# Patient Record
Sex: Female | Born: 1960
Health system: Southern US, Community
[De-identification: ages and names within clinical notes are randomized; demographics above are authoritative.]

## PROBLEM LIST (undated history)

## (undated) DIAGNOSIS — R739 Hyperglycemia, unspecified: Secondary | ICD-10-CM

## (undated) DIAGNOSIS — K859 Acute pancreatitis without necrosis or infection, unspecified: Secondary | ICD-10-CM

## (undated) DIAGNOSIS — E119 Type 2 diabetes mellitus without complications: Secondary | ICD-10-CM

## (undated) DIAGNOSIS — K439 Ventral hernia without obstruction or gangrene: Secondary | ICD-10-CM

## (undated) DIAGNOSIS — G4730 Sleep apnea, unspecified: Secondary | ICD-10-CM

## (undated) DIAGNOSIS — M19011 Primary osteoarthritis, right shoulder: Secondary | ICD-10-CM

## (undated) DIAGNOSIS — M75101 Unspecified rotator cuff tear or rupture of right shoulder, not specified as traumatic: Secondary | ICD-10-CM

## (undated) DIAGNOSIS — T7840XA Allergy, unspecified, initial encounter: Secondary | ICD-10-CM

## (undated) DIAGNOSIS — I10 Essential (primary) hypertension: Secondary | ICD-10-CM

## (undated) DIAGNOSIS — Z9989 Dependence on other enabling machines and devices: Secondary | ICD-10-CM

## (undated) DIAGNOSIS — K219 Gastro-esophageal reflux disease without esophagitis: Secondary | ICD-10-CM

## (undated) DIAGNOSIS — K760 Fatty (change of) liver, not elsewhere classified: Secondary | ICD-10-CM

## (undated) DIAGNOSIS — E669 Obesity, unspecified: Secondary | ICD-10-CM

## (undated) DIAGNOSIS — J449 Chronic obstructive pulmonary disease, unspecified: Secondary | ICD-10-CM

## (undated) DIAGNOSIS — J45909 Unspecified asthma, uncomplicated: Secondary | ICD-10-CM

## (undated) DIAGNOSIS — D219 Benign neoplasm of connective and other soft tissue, unspecified: Secondary | ICD-10-CM

## (undated) HISTORY — DX: Allergy, unspecified, initial encounter: T78.40XA

## (undated) HISTORY — PX: ORTHOPEDIC SURGERY: SHX850

## (undated) HISTORY — DX: Type 2 diabetes mellitus without complications: E11.9

## (undated) HISTORY — DX: Dependence on other enabling machines and devices: Z99.89

## (undated) HISTORY — PX: COLONOSCOPY: SHX174

## (undated) HISTORY — DX: Hyperglycemia, unspecified: R73.9

---

## 2000-12-06 ENCOUNTER — Emergency Department (HOSPITAL_COMMUNITY): Admission: EM | Admit: 2000-12-06 | Discharge: 2000-12-06 | Payer: Self-pay | Admitting: Emergency Medicine

## 2005-07-26 ENCOUNTER — Emergency Department (HOSPITAL_COMMUNITY): Admission: EM | Admit: 2005-07-26 | Discharge: 2005-07-27 | Payer: Self-pay | Admitting: Emergency Medicine

## 2006-03-03 ENCOUNTER — Emergency Department (HOSPITAL_COMMUNITY): Admission: EM | Admit: 2006-03-03 | Discharge: 2006-03-03 | Payer: Self-pay | Admitting: Emergency Medicine

## 2006-06-09 ENCOUNTER — Emergency Department (HOSPITAL_COMMUNITY): Admission: EM | Admit: 2006-06-09 | Discharge: 2006-06-09 | Payer: Self-pay | Admitting: Emergency Medicine

## 2007-12-27 ENCOUNTER — Emergency Department (HOSPITAL_COMMUNITY): Admission: EM | Admit: 2007-12-27 | Discharge: 2007-12-27 | Payer: Self-pay | Admitting: Emergency Medicine

## 2008-08-02 ENCOUNTER — Emergency Department (HOSPITAL_COMMUNITY): Admission: EM | Admit: 2008-08-02 | Discharge: 2008-08-02 | Payer: Self-pay | Admitting: Emergency Medicine

## 2008-11-28 ENCOUNTER — Emergency Department (HOSPITAL_COMMUNITY): Admission: EM | Admit: 2008-11-28 | Discharge: 2008-11-28 | Payer: Self-pay | Admitting: Emergency Medicine

## 2008-12-01 ENCOUNTER — Emergency Department (HOSPITAL_COMMUNITY): Admission: EM | Admit: 2008-12-01 | Discharge: 2008-12-01 | Payer: Self-pay | Admitting: Emergency Medicine

## 2009-04-02 ENCOUNTER — Encounter: Payer: Self-pay | Admitting: Gynecology

## 2009-04-02 ENCOUNTER — Ambulatory Visit: Payer: Self-pay | Admitting: Gynecology

## 2009-04-02 ENCOUNTER — Other Ambulatory Visit: Admission: RE | Admit: 2009-04-02 | Discharge: 2009-04-02 | Payer: Self-pay | Admitting: Gynecology

## 2010-08-09 ENCOUNTER — Emergency Department (HOSPITAL_COMMUNITY): Admission: EM | Admit: 2010-08-09 | Discharge: 2010-08-09 | Payer: Self-pay | Admitting: Emergency Medicine

## 2010-12-06 LAB — DIFFERENTIAL
Basophils Absolute: 0 10*3/uL (ref 0.0–0.1)
Basophils Relative: 0 % (ref 0–1)
Eosinophils Absolute: 0.2 10*3/uL (ref 0.0–0.7)
Eosinophils Relative: 3 % (ref 0–5)
Lymphocytes Relative: 28 % (ref 12–46)
Lymphs Abs: 1.5 10*3/uL (ref 0.7–4.0)
Monocytes Absolute: 0.7 10*3/uL (ref 0.1–1.0)
Monocytes Relative: 13 % — ABNORMAL HIGH (ref 3–12)
Neutro Abs: 3.1 10*3/uL (ref 1.7–7.7)
Neutrophils Relative %: 56 % (ref 43–77)

## 2010-12-06 LAB — POCT I-STAT, CHEM 8
BUN: 17 mg/dL (ref 6–23)
Calcium, Ion: 1.13 mmol/L (ref 1.12–1.32)
Chloride: 106 mEq/L (ref 96–112)
Creatinine, Ser: 0.8 mg/dL (ref 0.4–1.2)
Glucose, Bld: 107 mg/dL — ABNORMAL HIGH (ref 70–99)
HCT: 40 % (ref 36.0–46.0)
Hemoglobin: 13.6 g/dL (ref 12.0–15.0)
Potassium: 3.7 mEq/L (ref 3.5–5.1)
Sodium: 141 mEq/L (ref 135–145)
TCO2: 27 mmol/L (ref 0–100)

## 2010-12-06 LAB — CBC
HCT: 38 % (ref 36.0–46.0)
Hemoglobin: 11.3 g/dL — ABNORMAL LOW (ref 12.0–15.0)
MCH: 22.2 pg — ABNORMAL LOW (ref 26.0–34.0)
MCHC: 29.7 g/dL — ABNORMAL LOW (ref 30.0–36.0)
MCV: 74.8 fL — ABNORMAL LOW (ref 78.0–100.0)
Platelets: 271 10*3/uL (ref 150–400)
RBC: 5.08 MIL/uL (ref 3.87–5.11)
RDW: 18.3 % — ABNORMAL HIGH (ref 11.5–15.5)
WBC: 5.5 10*3/uL (ref 4.0–10.5)

## 2010-12-06 LAB — POCT CARDIAC MARKERS
CKMB, poc: 1.3 ng/mL (ref 1.0–8.0)
Myoglobin, poc: 57.2 ng/mL (ref 12–200)
Troponin i, poc: <0.05 ng/mL (ref 0.00–0.09)

## 2010-12-06 LAB — D-DIMER, QUANTITATIVE: D-Dimer, Quant: 0.42 ug/mL-FEU (ref 0.00–0.48)

## 2011-01-05 LAB — URINALYSIS, ROUTINE W REFLEX MICROSCOPIC
Bilirubin Urine: NEGATIVE
Glucose, UA: NEGATIVE mg/dL
Glucose, UA: NEGATIVE mg/dL
Ketones, ur: NEGATIVE mg/dL
Ketones, ur: NEGATIVE mg/dL
Leukocytes, UA: NEGATIVE
Nitrite: NEGATIVE
Nitrite: NEGATIVE
Protein, ur: NEGATIVE mg/dL
Specific Gravity, Urine: 1.022 (ref 1.005–1.030)
Urobilinogen, UA: 0.2 mg/dL (ref 0.0–1.0)
pH: 5 (ref 5.0–8.0)
pH: 5 (ref 5.0–8.0)

## 2011-01-05 LAB — COMPREHENSIVE METABOLIC PANEL
ALT: 14 U/L (ref 0–35)
AST: 17 U/L (ref 0–37)
Albumin: 3.4 g/dL — ABNORMAL LOW (ref 3.5–5.2)
Alkaline Phosphatase: 49 U/L (ref 39–117)
Alkaline Phosphatase: 51 U/L (ref 39–117)
BUN: 10 mg/dL (ref 6–23)
BUN: 13 mg/dL (ref 6–23)
CO2: 24 mEq/L (ref 19–32)
CO2: 26 mEq/L (ref 19–32)
Calcium: 9.2 mg/dL (ref 8.4–10.5)
Calcium: 9.2 mg/dL (ref 8.4–10.5)
Chloride: 108 mEq/L (ref 96–112)
Creatinine, Ser: 0.84 mg/dL (ref 0.4–1.2)
GFR calc Af Amer: >60 mL/min (ref >60)
GFR calc non Af Amer: 58 mL/min — ABNORMAL LOW (ref >60)
GFR calc non Af Amer: >60 mL/min (ref >60)
Glucose, Bld: 105 mg/dL — ABNORMAL HIGH (ref 70–99)
Glucose, Bld: 105 mg/dL — ABNORMAL HIGH (ref 70–99)
Potassium: 4.4 mEq/L (ref 3.5–5.1)
Sodium: 141 mEq/L (ref 135–145)
Total Bilirubin: 0.4 mg/dL (ref 0.3–1.2)
Total Protein: 6.7 g/dL (ref 6.0–8.3)
Total Protein: 7.1 g/dL (ref 6.0–8.3)

## 2011-01-05 LAB — URINE MICROSCOPIC-ADD ON

## 2011-01-05 LAB — CBC
HCT: 34.5 % — ABNORMAL LOW (ref 36.0–46.0)
HCT: 34.6 % — ABNORMAL LOW (ref 36.0–46.0)
Hemoglobin: 11.3 g/dL — ABNORMAL LOW (ref 12.0–15.0)
Hemoglobin: 11.4 g/dL — ABNORMAL LOW (ref 12.0–15.0)
MCHC: 32.7 g/dL (ref 30.0–36.0)
MCHC: 32.8 g/dL (ref 30.0–36.0)
MCV: 72.5 fL — ABNORMAL LOW (ref 78.0–100.0)
Platelets: 278 10*3/uL (ref 150–400)
RBC: 4.75 MIL/uL (ref 3.87–5.11)
RDW: 18.3 % — ABNORMAL HIGH (ref 11.5–15.5)
RDW: 18.5 % — ABNORMAL HIGH (ref 11.5–15.5)
WBC: 4 10*3/uL (ref 4.0–10.5)

## 2011-01-05 LAB — DIFFERENTIAL
Band Neutrophils: 0 % (ref 0–10)
Basophils Absolute: 0 10*3/uL (ref 0.0–0.1)
Basophils Relative: 0 % (ref 0–1)
Basophils Relative: 1 % (ref 0–1)
Blasts: 0 %
Eosinophils Absolute: 0.1 10*3/uL (ref 0.0–0.7)
Eosinophils Relative: 2 % (ref 0–5)
Lymphocytes Relative: 34 % (ref 12–46)
Lymphs Abs: 1.1 10*3/uL (ref 0.7–4.0)
Lymphs Abs: 1.4 10*3/uL (ref 0.7–4.0)
Metamyelocytes Relative: 0 %
Monocytes Absolute: 0.2 10*3/uL (ref 0.1–1.0)
Monocytes Relative: 14 % — ABNORMAL HIGH (ref 3–12)
Monocytes Relative: 6 % (ref 3–12)
Myelocytes: 0 %
Neutro Abs: 2.1 10*3/uL (ref 1.7–7.7)
Neutro Abs: 2.3 10*3/uL (ref 1.7–7.7)
Neutrophils Relative %: 52 % (ref 43–77)
Neutrophils Relative %: 58 % (ref 43–77)
Promyelocytes Absolute: 0 %
nRBC: 0 /100 WBC

## 2011-01-05 LAB — POCT PREGNANCY, URINE: Preg Test, Ur: NEGATIVE

## 2011-01-05 LAB — LIPASE, BLOOD
Lipase: 38 U/L (ref 11–59)
Lipase: 44 U/L (ref 11–59)

## 2011-01-05 LAB — URINE CULTURE

## 2011-01-13 ENCOUNTER — Emergency Department (HOSPITAL_COMMUNITY)
Admission: EM | Admit: 2011-01-13 | Discharge: 2011-01-14 | Disposition: A | Discharge status: Home / Self Care | Payer: BC Managed Care – PPO | Attending: Emergency Medicine | Admitting: Emergency Medicine

## 2011-01-13 ENCOUNTER — Emergency Department (HOSPITAL_COMMUNITY): Payer: BC Managed Care – PPO

## 2011-01-13 DIAGNOSIS — F17200 Nicotine dependence, unspecified, uncomplicated: Secondary | ICD-10-CM | POA: Insufficient documentation

## 2011-01-13 DIAGNOSIS — R109 Unspecified abdominal pain: Secondary | ICD-10-CM | POA: Insufficient documentation

## 2011-01-13 DIAGNOSIS — R10819 Abdominal tenderness, unspecified site: Secondary | ICD-10-CM | POA: Insufficient documentation

## 2011-01-13 DIAGNOSIS — R630 Anorexia: Secondary | ICD-10-CM | POA: Insufficient documentation

## 2011-01-13 DIAGNOSIS — R079 Chest pain, unspecified: Secondary | ICD-10-CM | POA: Insufficient documentation

## 2011-01-13 LAB — COMPREHENSIVE METABOLIC PANEL
ALT: 26 U/L (ref 0–35)
AST: 17 U/L (ref 0–37)
Albumin: 3.5 g/dL (ref 3.5–5.2)
Alkaline Phosphatase: 65 U/L (ref 39–117)
BUN: 15 mg/dL (ref 6–23)
Chloride: 105 mEq/L (ref 96–112)
GFR calc Af Amer: >60 mL/min (ref >60)
Potassium: 4 mEq/L (ref 3.5–5.1)
Sodium: 139 mEq/L (ref 135–145)
Total Bilirubin: 0.4 mg/dL (ref 0.3–1.2)
Total Protein: 7.1 g/dL (ref 6.0–8.3)

## 2011-01-13 LAB — CBC
MCV: 72.8 fL — ABNORMAL LOW (ref 78.0–100.0)
Platelets: 245 10*3/uL (ref 150–400)
RBC: 4.97 MIL/uL (ref 3.87–5.11)
RDW: 19.8 % — ABNORMAL HIGH (ref 11.5–15.5)
WBC: 6.9 10*3/uL (ref 4.0–10.5)

## 2011-01-13 LAB — URINALYSIS, ROUTINE W REFLEX MICROSCOPIC
Bilirubin Urine: NEGATIVE
Glucose, UA: NEGATIVE mg/dL
Hgb urine dipstick: NEGATIVE
Ketones, ur: NEGATIVE mg/dL
Protein, ur: NEGATIVE mg/dL
pH: 5.5 (ref 5.0–8.0)

## 2011-01-13 LAB — DIFFERENTIAL
Basophils Relative: 0 % (ref 0–1)
Eosinophils Absolute: 0.3 10*3/uL (ref 0.0–0.7)
Lymphocytes Relative: 32 % (ref 12–46)
Monocytes Absolute: 0.8 10*3/uL (ref 0.1–1.0)
Neutrophils Relative %: 53 % (ref 43–77)

## 2011-10-21 ENCOUNTER — Encounter (HOSPITAL_COMMUNITY): Payer: Self-pay | Admitting: *Deleted

## 2011-10-21 ENCOUNTER — Other Ambulatory Visit: Payer: Self-pay

## 2011-10-21 ENCOUNTER — Emergency Department (HOSPITAL_COMMUNITY): Payer: BC Managed Care – PPO

## 2011-10-21 ENCOUNTER — Emergency Department (HOSPITAL_COMMUNITY)
Admission: EM | Admit: 2011-10-21 | Discharge: 2011-10-22 | Disposition: A | Discharge status: Home / Self Care | Payer: BC Managed Care – PPO | Attending: Emergency Medicine | Admitting: Emergency Medicine

## 2011-10-21 DIAGNOSIS — K859 Acute pancreatitis without necrosis or infection, unspecified: Secondary | ICD-10-CM | POA: Insufficient documentation

## 2011-10-21 DIAGNOSIS — R10819 Abdominal tenderness, unspecified site: Secondary | ICD-10-CM | POA: Insufficient documentation

## 2011-10-21 DIAGNOSIS — R42 Dizziness and giddiness: Secondary | ICD-10-CM | POA: Insufficient documentation

## 2011-10-21 DIAGNOSIS — R109 Unspecified abdominal pain: Secondary | ICD-10-CM | POA: Insufficient documentation

## 2011-10-21 DIAGNOSIS — F17200 Nicotine dependence, unspecified, uncomplicated: Secondary | ICD-10-CM | POA: Insufficient documentation

## 2011-10-21 DIAGNOSIS — R079 Chest pain, unspecified: Secondary | ICD-10-CM | POA: Insufficient documentation

## 2011-10-21 HISTORY — DX: Gastro-esophageal reflux disease without esophagitis: K21.9

## 2011-10-21 LAB — CBC
HCT: 41.5 % (ref 36.0–46.0)
Hemoglobin: 12.9 g/dL (ref 12.0–15.0)
MCHC: 31.1 g/dL (ref 30.0–36.0)
WBC: 5.7 10*3/uL (ref 4.0–10.5)

## 2011-10-21 LAB — DIFFERENTIAL
Basophils Absolute: 0 10*3/uL (ref 0.0–0.1)
Basophils Relative: 1 % (ref 0–1)
Lymphocytes Relative: 31 % (ref 12–46)
Monocytes Relative: 11 % (ref 3–12)
Neutro Abs: 3.1 10*3/uL (ref 1.7–7.7)
Neutrophils Relative %: 54 % (ref 43–77)

## 2011-10-21 LAB — COMPREHENSIVE METABOLIC PANEL
AST: 16 U/L (ref 0–37)
Albumin: 3.9 g/dL (ref 3.5–5.2)
Alkaline Phosphatase: 66 U/L (ref 39–117)
BUN: 17 mg/dL (ref 6–23)
CO2: 27 mEq/L (ref 19–32)
Chloride: 101 mEq/L (ref 96–112)
GFR calc non Af Amer: >90 mL/min (ref >90)
Potassium: 4.1 mEq/L (ref 3.5–5.1)
Total Bilirubin: 0.2 mg/dL — ABNORMAL LOW (ref 0.3–1.2)

## 2011-10-21 MED ORDER — GI COCKTAIL ~~LOC~~
30.0000 mL | Freq: Once | ORAL | Status: AC
  Administered 2011-10-21: 30 mL via ORAL
  Filled 2011-10-21: qty 30

## 2011-10-21 MED ORDER — ONDANSETRON HCL 4 MG/2ML IJ SOLN
4.0000 mg | Freq: Once | INTRAMUSCULAR | Status: AC
  Administered 2011-10-21: 4 mg via INTRAVENOUS
  Filled 2011-10-21: qty 2

## 2011-10-21 MED ORDER — HYDROMORPHONE HCL PF 1 MG/ML IJ SOLN
1.0000 mg | Freq: Once | INTRAMUSCULAR | Status: AC
  Administered 2011-10-21: 1 mg via INTRAVENOUS
  Filled 2011-10-21: qty 1

## 2011-10-21 MED ORDER — PANTOPRAZOLE SODIUM 40 MG IV SOLR
40.0000 mg | Freq: Once | INTRAVENOUS | Status: AC
  Administered 2011-10-21: 40 mg via INTRAVENOUS
  Filled 2011-10-21: qty 40

## 2011-10-21 MED ORDER — SODIUM CHLORIDE 0.9 % IV SOLN
Freq: Once | INTRAVENOUS | Status: AC
  Administered 2011-10-21: 23:00:00 via INTRAVENOUS

## 2011-10-21 MED ORDER — IOHEXOL 300 MG/ML  SOLN
100.0000 mL | Freq: Once | INTRAMUSCULAR | Status: AC | PRN
  Administered 2011-10-21: 100 mL via INTRAVENOUS

## 2011-10-21 MED ORDER — MORPHINE SULFATE 4 MG/ML IJ SOLN
4.0000 mg | Freq: Once | INTRAMUSCULAR | Status: AC
  Administered 2011-10-21: 4 mg via INTRAVENOUS
  Filled 2011-10-21: qty 1

## 2011-10-21 MED ORDER — HYDROMORPHONE HCL PF 1 MG/ML IJ SOLN
1.0000 mg | INTRAMUSCULAR | Status: DC | PRN
  Administered 2011-10-21: 1 mg via INTRAVENOUS
  Filled 2011-10-21 (×2): qty 1

## 2011-10-21 NOTE — ED Provider Notes (Signed)
History     CSN: [id number]  Arrival date & time 10/21/11  4098   First MD Initiated Contact with Patient 10/21/11 2006      Chief Complaint  Patient presents with  . Abdominal Pain    epigastric, radiating to chest and umbilicus.  . Chest Pain  . Nausea  . Emesis     HPI  History provided by the patient. Patient is a 51 year old African American female with past history of acid reflux who presents with complaints of 3 days of epigastric and chest pains. Patient reports pain has gradually increased. Pain was waxing and waning over this time. Pain is described as sharp and burning. Pain is made worse by ED. Patient states pain increases significantly 5 minutes after trying to eat any foods. She has had decreased appetite. Patient reports keeping down fluids. She does report associated nausea and one episode of vomiting this morning. Patient denies having any diarrhea or constipation. She denies any blood or melena in stools. Patient denies any fever, chills, sweats. She denies any shortness of breath or heart palpitations. She has no other significant past medical history.    Past Medical History  Diagnosis Date  . Acid reflux     Past Surgical History  Procedure Date  . Orthopedic surgery     R ankle, tendonitis    History reviewed. No pertinent family history.  History  Substance Use Topics  . Smoking status: Current Everyday Smoker -- 0.5 packs/day    Types: Cigarettes  . Smokeless tobacco: Not on file  . Alcohol Use: No    OB History    Grav Para Term Preterm Abortions TAB SAB Ect Mult Living                  Review of Systems  Constitutional: Positive for appetite change. Negative for fever, chills and diaphoresis.  Respiratory: Negative for shortness of breath.   Cardiovascular: Positive for chest pain. Negative for palpitations.  Gastrointestinal: Positive for nausea, vomiting and abdominal pain. Negative for diarrhea and constipation.  Genitourinary:  Negative for dysuria, frequency, hematuria, flank pain, vaginal bleeding and vaginal discharge.  All other systems reviewed and are negative.    Allergies  Aspirin  Home Medications   Current Outpatient Rx  Name Route Sig Dispense Refill  . BISMUTH SUBSALICYLATE 262 MG/15ML PO SUSP Oral Take 15 mLs by mouth every 6 (six) hours as needed.      BP 153/102  Pulse 86  Temp(Src) 99.1 F (37.3 C) (Oral)  Resp 20  Ht 5\' 9"  (1.753 m)  Wt 281 lb 12 oz (127.8 kg)  BMI 41.61 kg/m2  SpO2 99%  Physical Exam  Nursing note and vitals reviewed. Constitutional: She is oriented to person, place, and time. She appears well-developed and well-nourished. No distress.  HENT:  Head: Normocephalic and atraumatic.  Mouth/Throat: Oropharynx is clear and moist.  Cardiovascular: Normal rate and regular rhythm.   Pulmonary/Chest: Effort normal and breath sounds normal. No respiratory distress. She has no wheezes. She has no rales. She exhibits tenderness.       Reproducible chest pain and pressure under left breast and along left sternal edge. No deformity or crepitus. Normal movements  Abdominal: Soft. There is tenderness in the right upper quadrant, epigastric area and left upper quadrant. There is rebound and positive Murphy's sign. There is no guarding, no CVA tenderness and no tenderness at McBurney's point.       Morbidly obese  Genitourinary: Rectum normal.  Chaperone was present  Neurological: She is alert and oriented to person, place, and time.  Skin: Skin is warm and dry. No rash noted.  Psychiatric: She has a normal mood and affect. Her behavior is normal.    ED Course  Procedures    Labs Reviewed  CBC  DIFFERENTIAL  I-STAT TROPONIN I  COMPREHENSIVE METABOLIC PANEL  LIPASE, BLOOD  POCT OCCULT BLOOD STOOL, DEVICE   Results for orders placed during the hospital encounter of 10/21/11  CBC      Component Value Range   WBC 5.7  4.0 - 10.5 (K/uL)   RBC 5.29 (*) 3.87 - 5.11  (MIL/uL)   Hemoglobin 12.9  12.0 - 15.0 (g/dL)   HCT 32.9  51.8 - 84.1 (%)   MCV 78.4  78.0 - 100.0 (fL)   MCH 24.4 (*) 26.0 - 34.0 (pg)   MCHC 31.1  30.0 - 36.0 (g/dL)   RDW 66.0 (*) 63.0 - 15.5 (%)   Platelets 262  150 - 400 (K/uL)  DIFFERENTIAL      Component Value Range   Neutrophils Relative 54  43 - 77 (%)   Neutro Abs 3.1  1.7 - 7.7 (K/uL)   Lymphocytes Relative 31  12 - 46 (%)   Lymphs Abs 1.8  0.7 - 4.0 (K/uL)   Monocytes Relative 11  3 - 12 (%)   Monocytes Absolute 0.6  0.1 - 1.0 (K/uL)   Eosinophils Relative 4  0 - 5 (%)   Eosinophils Absolute 0.2  0.0 - 0.7 (K/uL)   Basophils Relative 1  0 - 1 (%)   Basophils Absolute 0.0  0.0 - 0.1 (K/uL)  COMPREHENSIVE METABOLIC PANEL      Component Value Range   Sodium 137  135 - 145 (mEq/L)   Potassium 4.1  3.5 - 5.1 (mEq/L)   Chloride 101  96 - 112 (mEq/L)   CO2 27  19 - 32 (mEq/L)   Glucose, Bld 110 (*) 70 - 99 (mg/dL)   BUN 17  6 - 23 (mg/dL)   Creatinine, Ser 1.60  0.50 - 1.10 (mg/dL)   Calcium 9.6  8.4 - 10.9 (mg/dL)   Total Protein 8.3  6.0 - 8.3 (g/dL)   Albumin 3.9  3.5 - 5.2 (g/dL)   AST 16  0 - 37 (U/L)   ALT 22  0 - 35 (U/L)   Alkaline Phosphatase 66  39 - 117 (U/L)   Total Bilirubin 0.2 (*) 0.3 - 1.2 (mg/dL)   GFR calc non Af Amer >90  >90 (mL/min)   GFR calc Af Amer >90  >90 (mL/min)  LIPASE, BLOOD      Component Value Range   Lipase 61 (*) 11 - 59 (U/L)  OCCULT BLOOD, POC DEVICE      Component Value Range   Fecal Occult Bld NEGATIVE    POCT I-STAT TROPONIN I      Component Value Range   Troponin i, poc 0.00  0.00 - 0.08 (ng/mL)   Comment 3              Ct Angio Chest W/cm &/or Wo Cm  10/21/2011  *RADIOLOGY REPORT*  Clinical Data: Chest pain.  Dizziness.  Weakness.  Clinical suspicion for pulmonary embolism.  CT ANGIOGRAPHY CHEST  Technique:  Multidetector CT imaging of the chest using the standard protocol during bolus administration of intravenous contrast. Multiplanar reconstructed images including  MIPs were obtained and reviewed to evaluate the vascular anatomy.  Contrast: OMNIPAQUE  IOHEXOL 300 MG/ML IV SOLN  Comparison: None.  Findings: Satisfactory opacification of the pulmonary arteries noted, and there is no evidence of pulmonary emboli.  No evidence of thoracic aortic aneurysm or dissection.  No evidence of mediastinal hematoma or mass.  No lymphadenopathy seen elsewhere within the thorax.  There is no evidence of pleural or pericardial effusion.  No evidence of pulmonary infiltrate or mass.  No central endobronchial lesion identified.  IMPRESSION: Negative.  No evidence of pulmonary embolism or other active disease.  Original Report Authenticated By: Danae Orleans, M.D.   US Abdomen Complete  10/21/2011  *RADIOLOGY REPORT*  Clinical Data:  Epigastric pain radiating into the chest and umbilical areas for 3 days.  History of splenic hemangioma.  COMPLETE ABDOMINAL ULTRASOUND  Comparison:  Ultrasound 01/13/2011.  CT 12/01/2008.  Findings:  Gallbladder:  No gallstones, gallbladder wall thickening, or pericholecystic fluid.  Common bile duct:  Normal caliber extrahepatic bile ducts with normal diameter measured at about 6 mm.  Liver:  Diffusely increased echotexture throughout the liver consistent with infiltrative process such as fatty infiltration. No focal lesions demonstrated.  IVC:  Appears normal.  Pancreas:  Pancreas is not well visualized due to bowel gas and body habitus.  Spleen:  The spleen length measures 9.4 cm.  There is a poorly defined hyperechoic lesion in the superior aspect of the spleen measuring 3.3 x 5.2 x 4.6 cm.  No flow is demonstrated on color flow Doppler images.  The lesion appears stable since the previous ultrasound and CT studies and likely represents hemangioma.  Right Kidney:  Right kidney measures 12.3 cm length.  No hydronephrosis.  Left Kidney:  Left kidney measures 12.3 cm length.  No hydronephrosis.  Abdominal aorta:  Segmental visualization of the abdominal  aorta due to bowel gas.  Visualized portions demonstrate normal caliber.  IMPRESSION: Stable appearance of hypoechoic mass in the spleen suggesting hemangioma.  Diffuse coarsening of liver echotexture suggesting infiltrative process such as fatty infiltration.  No evidence of cholelithiasis or cholecystitis.  Original Report Authenticated By: Marlon Pel, M.D.     1. Pancreatitis       MDM  8:00 PM patient seen and evaluated. Patient in no acute distress. Patient signs of discomfort. Patient with epigastric and right upper quadrant pain with positive rebound sign. Symptoms concerning for biliary colic. Will begin initial workup with labs and order ultrasound.  10:30 PM patient feeling much better after pain medications. Patient still with some upper abdominal discomfort on exam. CT and ultrasound have not shown any concerning or emergent cause of symptoms. Lipase is elevated today. Pancreas was not visualized well on ultrasound. EKG and troponin normal. Patient with significant tenderness and pains in the abdomen with elevated lipase. ACS is thought less likely. Patient has no history of hypertension, hypercholesterolemia, diabetes, or CAD.  12:30 AM patient has been tolerating by mouth fluids with cup of ginger ale. She has no symptoms of nausea vomiting while here. Pain has improved. At this time plan to send patient home with diagnosis of mild pancreatitis. Patient has been given strict return precautions.     Date: 10/21/2011  Rate: 84  Rhythm: normal sinus rhythm  QRS Axis: normal  Intervals: normal  ST/T Wave abnormalities: normal  Conduction Disutrbances:none  Narrative Interpretation: Possible left atrial enlargement  Old EKG Reviewed: unchanged from 01/13/2012        Angus Seller, PA 10/22/11 0422

## 2011-10-21 NOTE — ED Notes (Signed)
[place], MD at bedside.  

## 2011-10-21 NOTE — ED Notes (Signed)
Dammen, PA at bedside.  

## 2011-10-21 NOTE — ED Notes (Signed)
Patient states "the pain has come back to where it was before." Rates pain 9 on 0-10 scale. Dammen, PA notified.

## 2011-10-21 NOTE — ED Notes (Signed)
Pt c/o epigastric pain radiating to sub-sternal chest and umbilicus. Pt states pain started x 3 days ago, characterized as sharp, shooting, stabbing. Pt states history of acid reflux, denies cardiac and pulmonary hx. Pt c/o n/v x 1 episode this morning.

## 2011-10-21 NOTE — ED Notes (Signed)
Patient transported to CT 

## 2011-10-21 NOTE — ED Notes (Signed)
Ultrasound tech at bedside to perform [place] of abdomen.

## 2011-10-21 NOTE — ED Notes (Signed)
Returned from CT.

## 2011-10-22 MED ORDER — PROMETHAZINE HCL 25 MG PO TABS: 25.0000 mg | ORAL_TABLET | Freq: Four times a day (QID) | ORAL | Status: AC | PRN

## 2011-10-22 MED ORDER — HYDROMORPHONE HCL PF 1 MG/ML IJ SOLN
1.0000 mg | Freq: Once | INTRAMUSCULAR | Status: AC
  Administered 2011-10-22: 1 mg via INTRAVENOUS

## 2011-10-22 MED ORDER — OXYCODONE-ACETAMINOPHEN 5-325 MG PO TABS: 1.0000 | ORAL_TABLET | Freq: Four times a day (QID) | ORAL | Status: AC | PRN

## 2011-10-22 NOTE — ED Provider Notes (Signed)
BP 133/92  Pulse 94  Temp(Src) 99.1 F (37.3 C) (Oral)  Resp 16  Ht 5\' 9"  (1.753 m)  Wt 281 lb 12 oz (127.8 kg)  BMI 41.61 kg/m2  SpO2 93% Pt well appearing, abd soft but reports continued pain in epigastric region Suspect mild pancreatitis Otherwise stable at this time  Joya Gaskins, MD 10/22/11 [phone]

## 2011-10-22 NOTE — ED Notes (Signed)
Dammen, PA at bedside.  

## 2011-10-22 NOTE — ED Notes (Signed)
Patient drank a small amount ginger ale. States no n/v but that abdominal pain came back after drinking ginger ale.

## 2011-10-23 NOTE — ED Provider Notes (Signed)
Medical screening examination/treatment/procedure(s) were conducted as a shared visit with non-physician practitioner(s) and myself.  I personally evaluated the patient during the encounter  Loren Racer, MD 10/23/11 [phone]

## 2011-11-04 ENCOUNTER — Emergency Department (HOSPITAL_COMMUNITY): Payer: BC Managed Care – PPO

## 2011-11-04 ENCOUNTER — Inpatient Hospital Stay (HOSPITAL_COMMUNITY)
Admission: EM | Admit: 2011-11-04 | Discharge: 2011-11-09 | DRG: 204 | Disposition: A | Discharge status: Home / Self Care | Payer: BC Managed Care – PPO | Attending: Internal Medicine | Admitting: Internal Medicine

## 2011-11-04 ENCOUNTER — Encounter (HOSPITAL_COMMUNITY): Payer: Self-pay | Admitting: *Deleted

## 2011-11-04 ENCOUNTER — Other Ambulatory Visit: Payer: Self-pay

## 2011-11-04 DIAGNOSIS — F17200 Nicotine dependence, unspecified, uncomplicated: Secondary | ICD-10-CM | POA: Diagnosis present

## 2011-11-04 DIAGNOSIS — K7689 Other specified diseases of liver: Secondary | ICD-10-CM | POA: Diagnosis present

## 2011-11-04 DIAGNOSIS — Z6830 Body mass index (BMI) 30.0-30.9, adult: Secondary | ICD-10-CM

## 2011-11-04 DIAGNOSIS — K760 Fatty (change of) liver, not elsewhere classified: Secondary | ICD-10-CM | POA: Diagnosis present

## 2011-11-04 DIAGNOSIS — K219 Gastro-esophageal reflux disease without esophagitis: Secondary | ICD-10-CM | POA: Diagnosis present

## 2011-11-04 DIAGNOSIS — E669 Obesity, unspecified: Secondary | ICD-10-CM | POA: Diagnosis present

## 2011-11-04 DIAGNOSIS — K859 Acute pancreatitis without necrosis or infection, unspecified: Principal | ICD-10-CM | POA: Diagnosis present

## 2011-11-04 DIAGNOSIS — Z6841 Body Mass Index (BMI) 40.0 and over, adult: Secondary | ICD-10-CM | POA: Diagnosis present

## 2011-11-04 DIAGNOSIS — Z6839 Body mass index (BMI) 39.0-39.9, adult: Secondary | ICD-10-CM

## 2011-11-04 DIAGNOSIS — Z720 Tobacco use: Secondary | ICD-10-CM | POA: Diagnosis present

## 2011-11-04 DIAGNOSIS — R1013 Epigastric pain: Secondary | ICD-10-CM | POA: Diagnosis present

## 2011-11-04 DIAGNOSIS — D219 Benign neoplasm of connective and other soft tissue, unspecified: Secondary | ICD-10-CM | POA: Diagnosis present

## 2011-11-04 HISTORY — DX: Ventral hernia without obstruction or gangrene: K43.9

## 2011-11-04 HISTORY — DX: Benign neoplasm of connective and other soft tissue, unspecified: D21.9

## 2011-11-04 HISTORY — DX: Essential (primary) hypertension: I10

## 2011-11-04 HISTORY — DX: Acute pancreatitis without necrosis or infection, unspecified: K85.90

## 2011-11-04 HISTORY — DX: Obesity, unspecified: E66.9

## 2011-11-04 HISTORY — DX: Fatty (change of) liver, not elsewhere classified: K76.0

## 2011-11-04 LAB — DIFFERENTIAL
Eosinophils Relative: 4 % (ref 0–5)
Lymphocytes Relative: 28 % (ref 12–46)
Lymphs Abs: 1.7 10*3/uL (ref 0.7–4.0)
Monocytes Absolute: 0.8 10*3/uL (ref 0.1–1.0)
Monocytes Relative: 14 % — ABNORMAL HIGH (ref 3–12)

## 2011-11-04 LAB — URINALYSIS, ROUTINE W REFLEX MICROSCOPIC
Hgb urine dipstick: NEGATIVE
Protein, ur: NEGATIVE mg/dL
Urobilinogen, UA: 0.2 mg/dL (ref 0.0–1.0)

## 2011-11-04 LAB — COMPREHENSIVE METABOLIC PANEL
ALT: 20 U/L (ref 0–35)
BUN: 13 mg/dL (ref 6–23)
CO2: 25 mEq/L (ref 19–32)
Calcium: 9.4 mg/dL (ref 8.4–10.5)
Creatinine, Ser: 0.58 mg/dL (ref 0.50–1.10)
GFR calc Af Amer: >90 mL/min (ref >90)
GFR calc non Af Amer: >90 mL/min (ref >90)
Glucose, Bld: 106 mg/dL — ABNORMAL HIGH (ref 70–99)
Sodium: 139 mEq/L (ref 135–145)

## 2011-11-04 LAB — CBC
HCT: 40.4 % (ref 36.0–46.0)
Hemoglobin: 12.4 g/dL (ref 12.0–15.0)
MCV: 78.4 fL (ref 78.0–100.0)
RBC: 5.15 MIL/uL — ABNORMAL HIGH (ref 3.87–5.11)
WBC: 6 10*3/uL (ref 4.0–10.5)

## 2011-11-04 LAB — LIPASE, BLOOD: Lipase: 62 U/L — ABNORMAL HIGH (ref 11–59)

## 2011-11-04 MED ORDER — SODIUM CHLORIDE 0.9 % IV SOLN
INTRAVENOUS | Status: AC
  Administered 2011-11-04: 14:00:00 via INTRAVENOUS

## 2011-11-04 MED ORDER — ONDANSETRON HCL 4 MG/2ML IJ SOLN: 4.0000 mg | Freq: Three times a day (TID) | INTRAMUSCULAR | Status: AC | PRN

## 2011-11-04 MED ORDER — NICOTINE 7 MG/24HR TD PT24
7.0000 mg | MEDICATED_PATCH | Freq: Every day | TRANSDERMAL | Status: DC
  Administered 2011-11-04 – 2011-11-09 (×6): 7 mg via TRANSDERMAL
  Filled 2011-11-04 (×7): qty 1

## 2011-11-04 MED ORDER — HYDROMORPHONE HCL PF 1 MG/ML IJ SOLN
1.0000 mg | Freq: Once | INTRAMUSCULAR | Status: AC
  Administered 2011-11-04: 1 mg via INTRAVENOUS
  Filled 2011-11-04: qty 1

## 2011-11-04 MED ORDER — POLYETHYLENE GLYCOL 3350 17 G PO PACK
17.0000 g | PACK | Freq: Every day | ORAL | Status: DC | PRN
  Filled 2011-11-04: qty 1

## 2011-11-04 MED ORDER — ZOLPIDEM TARTRATE 5 MG PO TABS
5.0000 mg | ORAL_TABLET | Freq: Every evening | ORAL | Status: DC | PRN
  Administered 2011-11-07 – 2011-11-08 (×2): 5 mg via ORAL
  Filled 2011-11-04 (×2): qty 1

## 2011-11-04 MED ORDER — PANTOPRAZOLE SODIUM 40 MG PO TBEC
40.0000 mg | DELAYED_RELEASE_TABLET | Freq: Every day | ORAL | Status: DC
  Administered 2011-11-04 – 2011-11-09 (×6): 40 mg via ORAL
  Filled 2011-11-04 (×8): qty 1

## 2011-11-04 MED ORDER — HYDROMORPHONE HCL PF 1 MG/ML IJ SOLN
1.0000 mg | INTRAMUSCULAR | Status: AC | PRN
  Administered 2011-11-04 (×3): 1 mg via INTRAVENOUS
  Filled 2011-11-04 (×3): qty 1

## 2011-11-04 MED ORDER — PANTOPRAZOLE SODIUM 40 MG IV SOLR
40.0000 mg | Freq: Once | INTRAVENOUS | Status: AC
  Administered 2011-11-04: 40 mg via INTRAVENOUS
  Filled 2011-11-04: qty 40

## 2011-11-04 MED ORDER — ONDANSETRON HCL 4 MG/2ML IJ SOLN
4.0000 mg | Freq: Once | INTRAMUSCULAR | Status: AC
  Administered 2011-11-04: 4 mg via INTRAVENOUS
  Filled 2011-11-04: qty 2

## 2011-11-04 MED ORDER — HYDROCODONE-ACETAMINOPHEN 5-325 MG PO TABS
1.0000 | ORAL_TABLET | ORAL | Status: DC | PRN
  Administered 2011-11-04 – 2011-11-06 (×6): 2 via ORAL
  Administered 2011-11-06: 1 via ORAL
  Administered 2011-11-06 – 2011-11-07 (×6): 2 via ORAL
  Administered 2011-11-08: 1 via ORAL
  Administered 2011-11-08: 2 via ORAL
  Filled 2011-11-04 (×8): qty 2
  Filled 2011-11-04: qty 1
  Filled 2011-11-04 (×7): qty 2

## 2011-11-04 MED ORDER — ONDANSETRON HCL 4 MG/2ML IJ SOLN
4.0000 mg | Freq: Four times a day (QID) | INTRAMUSCULAR | Status: DC | PRN
  Administered 2011-11-04 – 2011-11-09 (×6): 4 mg via INTRAVENOUS
  Filled 2011-11-04 (×9): qty 2

## 2011-11-04 MED ORDER — BISACODYL 5 MG PO TBEC: 5.0000 mg | DELAYED_RELEASE_TABLET | Freq: Every day | ORAL | Status: DC | PRN

## 2011-11-04 MED ORDER — HYDRALAZINE HCL 20 MG/ML IJ SOLN
10.0000 mg | Freq: Four times a day (QID) | INTRAMUSCULAR | Status: DC | PRN
  Administered 2011-11-04 – 2011-11-08 (×2): 10 mg via INTRAVENOUS
  Filled 2011-11-04 (×2): qty 1

## 2011-11-04 MED ORDER — ONDANSETRON HCL 4 MG PO TABS
4.0000 mg | ORAL_TABLET | Freq: Four times a day (QID) | ORAL | Status: DC | PRN
  Administered 2011-11-07 – 2011-11-08 (×2): 4 mg via ORAL
  Filled 2011-11-04: qty 1

## 2011-11-04 MED ORDER — IOHEXOL 300 MG/ML  SOLN
100.0000 mL | Freq: Once | INTRAMUSCULAR | Status: AC | PRN
  Administered 2011-11-04: 100 mL via INTRAVENOUS

## 2011-11-04 MED ORDER — ACETAMINOPHEN 325 MG PO TABS
650.0000 mg | ORAL_TABLET | Freq: Four times a day (QID) | ORAL | Status: DC | PRN
  Administered 2011-11-04: 650 mg via ORAL
  Filled 2011-11-04 (×2): qty 2

## 2011-11-04 MED ORDER — ALUM & MAG HYDROXIDE-SIMETH 200-200-20 MG/5ML PO SUSP: 30.0000 mL | Freq: Four times a day (QID) | ORAL | Status: DC | PRN

## 2011-11-04 MED ORDER — MORPHINE SULFATE 2 MG/ML IJ SOLN
2.0000 mg | INTRAMUSCULAR | Status: DC | PRN
  Administered 2011-11-04 – 2011-11-07 (×7): 2 mg via INTRAVENOUS
  Filled 2011-11-04 (×7): qty 1

## 2011-11-04 MED ORDER — SODIUM CHLORIDE 0.9 % IV SOLN
INTRAVENOUS | Status: DC
  Administered 2011-11-04 – 2011-11-07 (×6): via INTRAVENOUS

## 2011-11-04 MED ORDER — ACETAMINOPHEN 650 MG RE SUPP: 650.0000 mg | Freq: Four times a day (QID) | RECTAL | Status: DC | PRN

## 2011-11-04 MED ORDER — FAMOTIDINE 20 MG PO TABS
20.0000 mg | ORAL_TABLET | Freq: Two times a day (BID) | ORAL | Status: DC
  Administered 2011-11-04 – 2011-11-09 (×11): 20 mg via ORAL
  Filled 2011-11-04 (×14): qty 1

## 2011-11-04 NOTE — ED Provider Notes (Deleted)
History     CSN: [id number]  Arrival date & time 11/04/11  4098   First MD Initiated Contact with Patient 11/04/11 [phone]      Chief Complaint  Patient presents with  . Shortness of Breath  . Abdominal Pain    epigastric, rib arch    (Consider location/radiation/quality/duration/timing/severity/associated sxs/prior treatment) HPI Patient left AMA before I could talk to her Past Medical History  Diagnosis Date  . Acid reflux   . Pancreatitis     Past Surgical History  Procedure Date  . Orthopedic surgery     R ankle, tendonitis    History reviewed. No pertinent family history.  History  Substance Use Topics  . Smoking status: Current Everyday Smoker -- 0.5 packs/day    Types: Cigarettes  . Smokeless tobacco: Not on file  . Alcohol Use: No    OB History    Grav Para Term Preterm Abortions TAB SAB Ect Mult Living                  Review of Systems  Allergies  Aspirin  Home Medications   Current Outpatient Rx  Name Route Sig Dispense Refill  . BISMUTH SUBSALICYLATE 262 MG/15ML PO SUSP Oral Take 15 mLs by mouth every 6 (six) hours as needed. Upset stomach      BP 179/99  Pulse 82  Temp 98.1 F (36.7 C)  Resp 20  SpO2 100%  Physical Exam  ED Course  Procedures (including critical care time)  Labs Reviewed - No data to display No results found.   1. Abdominal pain       MDM  Left AMA        Dione Booze, MD 11/04/11 [phone]

## 2011-11-04 NOTE — ED Notes (Signed)
Gave report to [place] up on 5W

## 2011-11-04 NOTE — Progress Notes (Signed)
Tom Callahan,P.A. Aware via phone of recent elevated b/p's. Pt asymptomatic. Pain med recently given. See new orders entered by Tom into EP[facility]. Next shift RN, [employer], made aware.

## 2011-11-04 NOTE — ED Notes (Signed)
Pt reports she was seen two weeks ago for pancreatitis and sent home with pain management and told to drink clear liquids and crackers. Pt reports she has used up all her pain medication and pain never fully went away. Pt reports starting to eat a regular diet last Saturday and has had increasing central upper abdominal pain since that comes and goes and nausea without vomiting. Pt denies using alcohol

## 2011-11-04 NOTE — ED Notes (Signed)
Pt c/o epigastric and rib arch pain that increases w/ inspiration and PO fluids and food. Pt recently diagnosed w/ pancreatitis, denies ETOH.

## 2011-11-04 NOTE — H&P (Signed)
History and Physical  Sharon Swanson NWG:[id number] DOB: 01-Mar-1961 DOA: 11/04/2011  Referring physician: Dione Booze, MD PCP: None  Chief Complaint: Abdominal pain  HPI:  51 year old woman presented to the emergency department with epigastric pain. She was in the emergency department January 26 and diagnosed with mild pancreatitis. She was discharged home on pain medication.  She reports continued and constant pain over the last 4 weeks. Much worse with eating. Epigastric in location and severe in nature. She has had no nausea or vomiting with this. No diarrhea. No constipation. She has no previous history of GI illness. No history of pancreatitis. Denies alcohol intake.  She was diagnosed presumptively with pancreatitis and referred for admission.  Review of Systems:  Negative for fever, changes to her vision, sore throat, rash, muscle aches, chest pain, dysuria, bleeding. Mildly short of breath.  Past Medical History  Diagnosis Date  . Acid reflux    Past Surgical History  Procedure Date  . Orthopedic surgery     R ankle, tendonitis   Social History:  reports that she has been smoking Cigarettes.  She has been smoking about .3 packs per day. She does not have any smokeless tobacco history on file. She reports that she does not drink alcohol or use illicit drugs.  Allergies  Allergen Reactions  . Aspirin     Abdominal pain    Family History  Problem Relation Age of Onset  . Hypertension Mother     Prior to Admission medications   Medication Sig Start Date End Date Taking? Authorizing Provider  bismuth subsalicylate (PEPTO BISMOL) 262 MG/15ML suspension Take 15 mLs by mouth every 6 (six) hours as needed. Upset stomach    Historical Provider, MD   Physical Exam: Filed Vitals:   11/04/11 0655  BP: 179/99  Pulse: 82  Temp: 98.1 F (36.7 C)  Resp: 20  SpO2: 100%     General:  Appears calm. Appears to be uncomfortable. Nontoxic.  Eyes: Pupils equal, round, reactive  to light. Normal lids, irises, conjunctiva.  ENT: Grossly normal hearing. Normal lips and tongue.  Neck: No lymphadenopathy or masses. No thyromegaly.  Cardiovascular: Regular rate and rhythm. No murmur, rub, gallop. No lower extremity edema of significance.  Respiratory: Clear to auscultation bilaterally. No wheezes, rales, rhonchi. Normal respiratory effort.  Abdomen: Soft, nondistended. Mild pain with palpation of the epigastrium and right upper quadrant as well as left upper quadrant. No guarding. Benign.  Skin: Appears unremarkable. No rash, lesions or induration seen.  Musculoskeletal: Appears grossly normal.  Psychiatric: Grossly normal mood and affect. Speech fluent and appropriate.  Neurologic: Grossly normal.  Labs on Admission:  Basic Metabolic Panel:  Lab 11/04/11 5784  NA 139  K 3.9  CL 101  CO2 25  GLUCOSE 106*  BUN 13  CREATININE 0.58  CALCIUM 9.4  MG --  PHOS --   Liver Function Tests:  Lab 11/04/11 0730  AST 15  ALT 20  ALKPHOS 61  BILITOT 0.2*  PROT 7.8  ALBUMIN 3.5    Lab 11/04/11 0730  LIPASE 62*  AMYLASE --   CBC:  Lab 11/04/11 0730  WBC 6.0  NEUTROABS 3.2  HGB 12.4  HCT 40.4  MCV 78.4  PLT 287   Radiological Exams on Admission: Ct Abdomen Pelvis W Contrast  11/04/2011  *RADIOLOGY REPORT*  Clinical Data: Shortness of breath and abdominal pain  CT ABDOMEN AND PELVIS WITH CONTRAST  Technique:  Multidetector CT imaging of the abdomen and pelvis was  performed following the standard protocol during bolus administration of intravenous contrast.  Contrast: OMNIPAQUE IOHEXOL 300 MG/ML IV SOLN  Comparison: 12/01/2008  Findings: There is atelectasis noted within both lung bases.  Stable low density structure within the splenic parenchyma measuring 4.5 cm.  Likely benign hemangioma.  Both adrenal glands are normal.  The pancreas appears unremarkable.  The gallbladder is normal.  There is no biliary dilatation.  Fatty infiltration of the  right hepatic lobe noted.  No suspicious liver abnormalities noted.  The kidneys are unremarkable.  Scattered upper abdominal lymph nodes.  No adenopathy.  No pelvic or inguinal adenopathy.  Urinary bladder appears normal.  There is an enlarged fibroid uterus identified.  Stomach and the small bowel loops are negative.  The appendix is identified and appears normal.  The colon is normal.  There is no free fluid or fluid collections within the abdomen or pelvis.  Ventral abdominal wall hernia is noted which contains fat only.  There is mild multilevel spondylosis within the lumbar spine.  No fracture or subluxation.  IMPRESSION:  1.  No acute findings identified within the abdomen or pelvis. 2.  Ventral abdominal wall hernia which contains fat only. 3.  Enlarged fibroid uterus.  Original Report Authenticated By: Rosealee Albee, M.D.    EKG: Independently reviewed. Normal sinus rhythm. Left atrial enlargement. Nonspecific ST changes. No acute changes seen.  Assessment/Plan 1. Epigastric abdominal pain: Mild elevation of lipase. Symptoms are most suggestive of pancreatitis although this cannot be confirmed on imaging. Recent abdominal ultrasound is unremarkable. LFTs unremarkable and gallbladder etiology doubted at this point. Plan as below. Differential includes pancreatitis, GERD. 2. Presumed pancreatitis: Bowel rest. IV fluids. Pain control. Repeat lipase in the morning. Fasting lipid panel. Denies alcohol intake. 3. GERD: Start Pepcid and PPI. Consider outpatient GI evaluation. 4. Cigarette smoker: Recommend cessation 5. Recommend establishing with a primary care physician.  Code Status: Full code Family Communication: Mr. [place]: [phone] Disposition Plan: Home when improved.  Brendia Sacks, MD  [facility] Pager [phone] 11/04/2011, 11:00 AM

## 2011-11-04 NOTE — ED Provider Notes (Signed)
History     CSN: [id number]  Arrival date & time 11/04/11  4098   First MD Initiated Contact with Patient 11/04/11 [phone]      Chief Complaint  Patient presents with  . Shortness of Breath  . Abdominal Pain    epigastric, rib arch    (Consider location/radiation/quality/duration/timing/severity/associated sxs/prior treatment) Patient is a 51 y.o. female presenting with shortness of breath and abdominal pain. The history is provided by the patient.  Shortness of Breath  Associated symptoms include shortness of breath.  Abdominal Pain The primary symptoms of the illness include abdominal pain and shortness of breath.  She  has been having upper abdominal pain for the last month. Pain is sharp and burning and radiates around to the back. Pain is moderately severe and she rates it at 7/10. She had been in the emergency department on September 26 and diagnosed with pancreatitis. She was sent home with pain medicine which she says made her sleepy but didn't take the pain away. She continues to have pain and it is getting worse. Pain is currently rated at 10 out of 10. It is worse with movement worse with eating. There is associated nausea but no vomiting. She denies fever, chills, sweats. There's been no constipation or diarrhea. She denies alcohol use and states she was not told why she developed pancreatitis.  Past Medical History  Diagnosis Date  . Acid reflux   . Pancreatitis     Past Surgical History  Procedure Date  . Orthopedic surgery     R ankle, tendonitis    History reviewed. No pertinent family history.  History  Substance Use Topics  . Smoking status: Current Everyday Smoker -- 0.5 packs/day    Types: Cigarettes  . Smokeless tobacco: Not on file  . Alcohol Use: No    OB History    Grav Para Term Preterm Abortions TAB SAB Ect Mult Living                  Review of Systems  Respiratory: Positive for shortness of breath.   Gastrointestinal: Positive for  abdominal pain.  All other systems reviewed and are negative.    Allergies  Aspirin  Home Medications   Current Outpatient Rx  Name Route Sig Dispense Refill  . BISMUTH SUBSALICYLATE 262 MG/15ML PO SUSP Oral Take 15 mLs by mouth every 6 (six) hours as needed. Upset stomach      BP 179/99  Pulse 82  Temp 98.1 F (36.7 C)  Resp 20  SpO2 100%  Physical Exam  Nursing note and vitals reviewed.  51 year old female who appears uncomfortable but is not in acute distress. Vital signs are significant for hypertension with blood pressure 179/99. Oxygen saturation is 100% which is normal. Head is normocephalic and atraumatic. PERRLA, EOMI. There is no scleral icterus. Mucous membranes are moist oropharynx is clear. Neck is nontender and supple without adenopathy. The lungs are clear without rales, wheezes, rhonchi. Heart has regular rate and rhythm without murmur. Abdomen is soft, flat, with moderate to severe tenderness in the epigastric area. There is no rebound or guarding. Peristalsis is diminished. Extremities have trace edema, no cyanosis,full range of motion is present. The skin is warm and dry without rash. Neurologic: Mental status is normal, cranial nerves are intact, there no focal motor or sensory deficits. Psychiatric: No abnormalities of mood or affect.  ED Course  Procedures (including critical care time)  Results for orders placed during the hospital encounter of 11/04/11  CBC      Component Value Range   WBC 6.0  4.0 - 10.5 (K/uL)   RBC 5.15 (*) 3.87 - 5.11 (MIL/uL)   Hemoglobin 12.4  12.0 - 15.0 (g/dL)   HCT 16.1  09.6 - 04.5 (%)   MCV 78.4  78.0 - 100.0 (fL)   MCH 24.1 (*) 26.0 - 34.0 (pg)   MCHC 30.7  30.0 - 36.0 (g/dL)   RDW 40.9 (*) 81.1 - 15.5 (%)   Platelets 287  150 - 400 (K/uL)  DIFFERENTIAL      Component Value Range   Neutrophils Relative 54  43 - 77 (%)   Neutro Abs 3.2  1.7 - 7.7 (K/uL)   Lymphocytes Relative 28  12 - 46 (%)   Lymphs Abs 1.7  0.7 - 4.0  (K/uL)   Monocytes Relative 14 (*) 3 - 12 (%)   Monocytes Absolute 0.8  0.1 - 1.0 (K/uL)   Eosinophils Relative 4  0 - 5 (%)   Eosinophils Absolute 0.2  0.0 - 0.7 (K/uL)   Basophils Relative 0  0 - 1 (%)   Basophils Absolute 0.0  0.0 - 0.1 (K/uL)  COMPREHENSIVE METABOLIC PANEL      Component Value Range   Sodium 139  135 - 145 (mEq/L)   Potassium 3.9  3.5 - 5.1 (mEq/L)   Chloride 101  96 - 112 (mEq/L)   CO2 25  19 - 32 (mEq/L)   Glucose, Bld 106 (*) 70 - 99 (mg/dL)   BUN 13  6 - 23 (mg/dL)   Creatinine, Ser 9.14  0.50 - 1.10 (mg/dL)   Calcium 9.4  8.4 - 78.2 (mg/dL)   Total Protein 7.8  6.0 - 8.3 (g/dL)   Albumin 3.5  3.5 - 5.2 (g/dL)   AST 15  0 - 37 (U/L)   ALT 20  0 - 35 (U/L)   Alkaline Phosphatase 61  39 - 117 (U/L)   Total Bilirubin 0.2 (*) 0.3 - 1.2 (mg/dL)   GFR calc non Af Amer >90  >90 (mL/min)   GFR calc Af Amer >90  >90 (mL/min)  LIPASE, BLOOD      Component Value Range   Lipase 62 (*) 11 - 59 (U/L)  URINALYSIS, ROUTINE W REFLEX MICROSCOPIC      Component Value Range   Color, Urine YELLOW  YELLOW    APPearance CLEAR  CLEAR    Specific Gravity, Urine 1.015  1.005 - 1.030    pH 7.0  5.0 - 8.0    Glucose, UA NEGATIVE  NEGATIVE (mg/dL)   Hgb urine dipstick NEGATIVE  NEGATIVE    Bilirubin Urine NEGATIVE  NEGATIVE    Ketones, ur NEGATIVE  NEGATIVE (mg/dL)   Protein, ur NEGATIVE  NEGATIVE (mg/dL)   Urobilinogen, UA 0.2  0.0 - 1.0 (mg/dL)   Nitrite NEGATIVE  NEGATIVE    Leukocytes, UA NEGATIVE  NEGATIVE    Ct Angio Chest W/cm &/or Wo Cm  10/21/2011  *RADIOLOGY REPORT*  Clinical Data: Chest pain.  Dizziness.  Weakness.  Clinical suspicion for pulmonary embolism.  CT ANGIOGRAPHY CHEST  Technique:  Multidetector CT imaging of the chest using the standard protocol during bolus administration of intravenous contrast. Multiplanar reconstructed images including MIPs were obtained and reviewed to evaluate the vascular anatomy.  Contrast: OMNIPAQUE IOHEXOL 300 MG/ML IV  SOLN  Comparison: None.  Findings: Satisfactory opacification of the pulmonary arteries noted, and there is no evidence of pulmonary emboli.  No evidence of  thoracic aortic aneurysm or dissection.  No evidence of mediastinal hematoma or mass.  No lymphadenopathy seen elsewhere within the thorax.  There is no evidence of pleural or pericardial effusion.  No evidence of pulmonary infiltrate or mass.  No central endobronchial lesion identified.  IMPRESSION: Negative.  No evidence of pulmonary embolism or other active disease.  Original Report Authenticated By: Danae Orleans, M.D.   US Abdomen Complete  10/21/2011  *RADIOLOGY REPORT*  Clinical Data:  Epigastric pain radiating into the chest and umbilical areas for 3 days.  History of splenic hemangioma.  COMPLETE ABDOMINAL ULTRASOUND  Comparison:  Ultrasound 01/13/2011.  CT 12/01/2008.  Findings:  Gallbladder:  No gallstones, gallbladder wall thickening, or pericholecystic fluid.  Common bile duct:  Normal caliber extrahepatic bile ducts with normal diameter measured at about 6 mm.  Liver:  Diffusely increased echotexture throughout the liver consistent with infiltrative process such as fatty infiltration. No focal lesions demonstrated.  IVC:  Appears normal.  Pancreas:  Pancreas is not well visualized due to bowel gas and body habitus.  Spleen:  The spleen length measures 9.4 cm.  There is a poorly defined hyperechoic lesion in the superior aspect of the spleen measuring 3.3 x 5.2 x 4.6 cm.  No flow is demonstrated on color flow Doppler images.  The lesion appears stable since the previous ultrasound and CT studies and likely represents hemangioma.  Right Kidney:  Right kidney measures 12.3 cm length.  No hydronephrosis.  Left Kidney:  Left kidney measures 12.3 cm length.  No hydronephrosis.  Abdominal aorta:  Segmental visualization of the abdominal aorta due to bowel gas.  Visualized portions demonstrate normal caliber.  IMPRESSION: Stable appearance of hypoechoic  mass in the spleen suggesting hemangioma.  Diffuse coarsening of liver echotexture suggesting infiltrative process such as fatty infiltration.  No evidence of cholelithiasis or cholecystitis.  Original Report Authenticated By: Marlon Pel, M.D.      Date: 11/04/2011  Rate: 84  Rhythm: normal sinus rhythm  QRS Axis: left  Intervals: QT prolonged  ST/T Wave abnormalities: nonspecific T wave changes  Conduction Disutrbances:none  Narrative Interpretation: Left atrial hypertrophy, borderline left axis deviation, nonspecific T wave flattening, of borderline prolonged QT interval. When compared with ECG of 10/21/2011, no significant changes are seen.  Old EKG Reviewed: unchanged  0820: She has been given IV fluids, IV Dilaudid, IV Zofran, and IV Protonix with slight relief of pain. She was given an additional dose of Dilaudid.  1100: She started having increasing pain, and required additional dose of Dilaudid. Workup is positive for mildly elevated lipase. Her symptoms seem out of proportion to the degree of elevation of lipase, so I am wondering if there is some other pathology behind her abdominal pain. CT scan has been ordered and arrangements will be made to admit her light of inability to control pain adequately as an outpatient. Case is discussed with Dr. Irene Limbo of [facility] who agrees to admit the patient.  1. Pancreatitis       MDM  Clinical ongoing problem with pancreatitis. Specific etiology is unclear.        Dione Booze, MD 11/04/11 1122

## 2011-11-05 LAB — COMPREHENSIVE METABOLIC PANEL
ALT: 17 U/L (ref 0–35)
Alkaline Phosphatase: 62 U/L (ref 39–117)
GFR calc Af Amer: >90 mL/min (ref >90)
Glucose, Bld: 108 mg/dL — ABNORMAL HIGH (ref 70–99)
Potassium: 4 mEq/L (ref 3.5–5.1)
Sodium: 135 mEq/L (ref 135–145)
Total Protein: 7.7 g/dL (ref 6.0–8.3)

## 2011-11-05 LAB — CBC
HCT: 41 % (ref 36.0–46.0)
MCHC: 30.7 g/dL (ref 30.0–36.0)
MCV: 78.1 fL (ref 78.0–100.0)
RDW: 17.3 % — ABNORMAL HIGH (ref 11.5–15.5)

## 2011-11-05 NOTE — Progress Notes (Signed)
PROGRESS NOTE  Sharon Swanson ZOX:[id number] DOB: 07/20/1961 DOA: 11/04/2011 PCP: None  Brief narrative: 51 year old woman presented to the emergency department with epigastric pain. She was in the emergency department January 26 and diagnosed with mild pancreatitis. She was discharged home on pain medication.   She reports continued and constant pain over the last 4 weeks. Much worse with eating. Epigastric in location and severe in nature. She has had no nausea or vomiting with this. No diarrhea. No constipation. She has no previous history of GI illness. No history of pancreatitis. Denies alcohol intake.   She was diagnosed presumptively with pancreatitis and referred for admission.  Past medical history: GERD  Consultants:    Procedures:    Antibiotics:    Interim History: Interval documentation reviewed. Hypertensive. Lipase now within normal limits.  Subjective: Overall feels a little bit better. Pain medication effective. No vomiting. No intake except for ice chips.  Objective: Filed Vitals:   11/04/11 2300 11/05/11 0200 11/05/11 0248 11/05/11 0520  BP: 182/89 174/94 150/92 158/94  Pulse:  88  86  Temp:  98 F (36.7 C)  98.6 F (37 C)  TempSrc:  Oral  Oral  Resp:  18  18  Height:      Weight:      SpO2:  99%  97%    Intake/Output Summary (Last 24 hours) at 11/05/11 0756 Last data filed at 11/05/11 0650  Gross per 24 hour  Intake 2447.5 ml  Output   2400 ml  Net   47.5 ml    Exam:   General:  Appears calm and comfortable.  Cardiovascular: Regular rate and rhythm. No murmur, rub, gallop.  Respiratory: Clear to auscultation bilaterally. No wheezes, rales, rhonchi. Normal respiratory effort.  Abdomen: Soft, mild epigastric pain. Minimal right upper quadrant pain. Lower abdomen nontender.  Data Reviewed: Basic Metabolic Panel:  Lab 11/05/11 8119 11/04/11 0730  NA 135 139  K 4.0 3.9  CL 99 101  CO2 27 25  GLUCOSE 108* 106*  BUN 9 13    CREATININE 0.64 0.58  CALCIUM 9.4 9.4  MG -- --  PHOS -- --   Liver Function Tests:  Lab 11/05/11 0355 11/04/11 0730  AST 12 15  ALT 17 20  ALKPHOS 62 61  BILITOT 0.3 0.2*  PROT 7.7 7.8  ALBUMIN 3.5 3.5    Lab 11/05/11 0355 11/04/11 0730  LIPASE 50 62*  AMYLASE -- --   CBC:  Lab 11/05/11 0355 11/04/11 0730  WBC 5.8 6.0  NEUTROABS -- 3.2  HGB 12.6 12.4  HCT 41.0 40.4  MCV 78.1 78.4  PLT 255 287    Studies: Ct Angio Chest W/cm &/or Wo Cm  10/21/2011  *RADIOLOGY REPORT*  Clinical Data: Chest pain.  Dizziness.  Weakness.  Clinical suspicion for pulmonary embolism.  CT ANGIOGRAPHY CHEST  Technique:  Multidetector CT imaging of the chest using the standard protocol during bolus administration of intravenous contrast. Multiplanar reconstructed images including MIPs were obtained and reviewed to evaluate the vascular anatomy.  Contrast: OMNIPAQUE IOHEXOL 300 MG/ML IV SOLN  Comparison: None.  Findings: Satisfactory opacification of the pulmonary arteries noted, and there is no evidence of pulmonary emboli.  No evidence of thoracic aortic aneurysm or dissection.  No evidence of mediastinal hematoma or mass.  No lymphadenopathy seen elsewhere within the thorax.  There is no evidence of pleural or pericardial effusion.  No evidence of pulmonary infiltrate or mass.  No central endobronchial lesion identified.  IMPRESSION:  Negative.  No evidence of pulmonary embolism or other active disease.  Original Report Authenticated By: Danae Orleans, M.D.   US Abdomen Complete  10/21/2011  *RADIOLOGY REPORT*  Clinical Data:  Epigastric pain radiating into the chest and umbilical areas for 3 days.  History of splenic hemangioma.  COMPLETE ABDOMINAL ULTRASOUND  Comparison:  Ultrasound 01/13/2011.  CT 12/01/2008.  Findings:  Gallbladder:  No gallstones, gallbladder wall thickening, or pericholecystic fluid.  Common bile duct:  Normal caliber extrahepatic bile ducts with normal diameter measured at  about 6 mm.  Liver:  Diffusely increased echotexture throughout the liver consistent with infiltrative process such as fatty infiltration. No focal lesions demonstrated.  IVC:  Appears normal.  Pancreas:  Pancreas is not well visualized due to bowel gas and body habitus.  Spleen:  The spleen length measures 9.4 cm.  There is a poorly defined hyperechoic lesion in the superior aspect of the spleen measuring 3.3 x 5.2 x 4.6 cm.  No flow is demonstrated on color flow Doppler images.  The lesion appears stable since the previous ultrasound and CT studies and likely represents hemangioma.  Right Kidney:  Right kidney measures 12.3 cm length.  No hydronephrosis.  Left Kidney:  Left kidney measures 12.3 cm length.  No hydronephrosis.  Abdominal aorta:  Segmental visualization of the abdominal aorta due to bowel gas.  Visualized portions demonstrate normal caliber.  IMPRESSION: Stable appearance of hypoechoic mass in the spleen suggesting hemangioma.  Diffuse coarsening of liver echotexture suggesting infiltrative process such as fatty infiltration.  No evidence of cholelithiasis or cholecystitis.  Original Report Authenticated By: Marlon Pel, M.D.   Ct Abdomen Pelvis W Contrast  11/04/2011  *RADIOLOGY REPORT*  Clinical Data: Shortness of breath and abdominal pain  CT ABDOMEN AND PELVIS WITH CONTRAST  Technique:  Multidetector CT imaging of the abdomen and pelvis was performed following the standard protocol during bolus administration of intravenous contrast.  Contrast: OMNIPAQUE IOHEXOL 300 MG/ML IV SOLN  Comparison: 12/01/2008  Findings: There is atelectasis noted within both lung bases.  Stable low density structure within the splenic parenchyma measuring 4.5 cm.  Likely benign hemangioma.  Both adrenal glands are normal.  The pancreas appears unremarkable.  The gallbladder is normal.  There is no biliary dilatation.  Fatty infiltration of the right hepatic lobe noted.  No suspicious liver abnormalities  noted.  The kidneys are unremarkable.  Scattered upper abdominal lymph nodes.  No adenopathy.  No pelvic or inguinal adenopathy.  Urinary bladder appears normal.  There is an enlarged fibroid uterus identified.  Stomach and the small bowel loops are negative.  The appendix is identified and appears normal.  The colon is normal.  There is no free fluid or fluid collections within the abdomen or pelvis.  Ventral abdominal wall hernia is noted which contains fat only.  There is mild multilevel spondylosis within the lumbar spine.  No fracture or subluxation.  IMPRESSION:  1.  No acute findings identified within the abdomen or pelvis. 2.  Ventral abdominal wall hernia which contains fat only. 3.  Enlarged fibroid uterus.  Original Report Authenticated By: Rosealee Albee, M.D.    Scheduled Meds:   . sodium chloride   Intravenous STAT  . famotidine  20 mg Oral BID  . HYDROmorphone  1 mg Intravenous Once  .  HYDROmorphone (DILAUDID) injection  1 mg Intravenous Once  . HYDROmorphone  1 mg Intravenous Once  . nicotine  7 mg Transdermal Daily  . ondansetron (  ZOFRAN) IV  4 mg Intravenous Once  . pantoprazole  40 mg Oral Q1200   Continuous Infusions:   . sodium chloride 75 mL/hr at 11/05/11 0223     Assessment/Plan: 1. Epigastric abdominal pain: Mild elevation of lipase. Symptoms are most suggestive of pancreatitis although this cannot be confirmed on imaging. Recent abdominal ultrasound is unremarkable. LFTs unremarkable and gallbladder etiology doubted at this point. Plan as below. Differential includes pancreatitis, GERD. 2. Presumed pancreatitis: Bowel rest. IV fluids. Pain control. Repeat lipase within normal limits. Followup fasting lipid panel. Denies alcohol intake. 3. GERD: Continue Pepcid and PPI. Consider outpatient GI evaluation. 4. Cigarette smoker: Recommend cessation. 5. Recommend establishing with a primary care physician.  Code Status: Full code  Family Communication: Mr.  [place]: [phone]  Disposition Plan: Home when improved.   Brendia Sacks, MD  [facility] Pager [phone] 11/05/2011, 7:56 AM    LOS: 1 day

## 2011-11-05 NOTE — Plan of Care (Signed)
Problem: Phase II Progression Outcomes Goal: Vital signs remain stable Outcome: Progressing Pt remains hypertensive

## 2011-11-06 LAB — BASIC METABOLIC PANEL
BUN: 9 mg/dL (ref 6–23)
Calcium: 9.7 mg/dL (ref 8.4–10.5)
Creatinine, Ser: 0.7 mg/dL (ref 0.50–1.10)
GFR calc non Af Amer: >90 mL/min (ref >90)
Glucose, Bld: 93 mg/dL (ref 70–99)
Sodium: 135 mEq/L (ref 135–145)

## 2011-11-06 MED ORDER — PROMETHAZINE HCL 25 MG/ML IJ SOLN
25.0000 mg | Freq: Once | INTRAMUSCULAR | Status: AC
  Administered 2011-11-06: 25 mg via INTRAVENOUS
  Filled 2011-11-06: qty 1

## 2011-11-06 NOTE — Progress Notes (Signed)
   CARE MANAGEMENT NOTE 11/06/2011  Patient:  Sharon Swanson, Sharon Swanson   Account Number:  [id number]  Date Initiated:  11/06/2011  Documentation initiated by:  Lanier Clam  Subjective/Objective Assessment:   ADMITTED W/ABD PAIN.     Action/Plan:   FROM HOME   Anticipated DC Date:  11/09/2011   Anticipated DC Plan:  HOME/SELF CARE  In-house referral  PCP / [facility]         Choice offered to / List presented to:             Status of service:  In process, will continue to follow Medicare Important Message given?   (If response is "NO", the following Medicare IM given date fields will be blank) Date Medicare IM given:   Date Additional Medicare IM given:    Discharge Disposition:    Per UR Regulation:  Reviewed for med. necessity/level of care/duration of stay  Comments:  11/06/11 [facility] Baldo Hufnagle RN,BSN [phone]

## 2011-11-06 NOTE — Progress Notes (Signed)
PROGRESS NOTE  Sharon Swanson ZOX:[id number] DOB: [birth year] DOA: 11/04/2011 PCP: None  Brief narrative: 51 year old woman presented to the emergency department with epigastric pain. She was in the emergency department January 26 and diagnosed with mild pancreatitis. She was discharged home on pain medication.   She reports continued and constant pain over the last 4 weeks. Much worse with eating. Epigastric in location and severe in nature. She has had no nausea or vomiting with this. No diarrhea. No constipation. She has no previous history of GI illness. No history of pancreatitis. Denies alcohol intake.   She was diagnosed presumptively with pancreatitis and referred for admission.  Past medical history: GERD  Consultants:    Procedures:    Antibiotics:    Interim History: Interval documentation reviewed. Hypertensive.  Subjective: Feels a little bit better. Pain medications effective. One episode of vomiting last night.  Objective: Filed Vitals:   11/05/11 1011 11/05/11 1440 11/05/11 2115 11/06/11 0515  BP: 135/86 142/94 135/80 158/92  Pulse: 86 76 79 71  Temp: 98.3 F (36.8 C) 98.2 F (36.8 C) 98 F (36.7 C) 97.9 F (36.6 C)  TempSrc: Oral Oral Oral Oral  Resp: 18 18 16 18   Height:      Weight:      SpO2: 98% 97% 95% 96%    Intake/Output Summary (Last 24 hours) at 11/06/11 0939 Last data filed at 11/06/11 0700  Gross per 24 hour  Intake 1747.5 ml  Output   1950 ml  Net -202.5 ml    Exam:   General:  Appears calm and comfortable.  Cardiovascular: Regular rate and rhythm. No murmur, rub, gallop.  Respiratory: Clear to auscultation bilaterally. No wheezes, rales, rhonchi. Normal respiratory effort.  Abdomen: Soft, mild epigastric pain. Minimal right upper quadrant pain. Lower abdomen nontender.  Data Reviewed: Basic Metabolic Panel:  Lab 11/06/11 8119 11/05/11 0355 11/04/11 0730  NA 135 135 139  K 4.2 4.0 --  CL 99 99 101  CO2 30 27 25     GLUCOSE 93 108* 106*  BUN 9 9 13   CREATININE 0.70 0.64 0.58  CALCIUM 9.7 9.4 9.4  MG -- -- --  PHOS -- -- --   Liver Function Tests:  Lab 11/05/11 0355 11/04/11 0730  AST 12 15  ALT 17 20  ALKPHOS 62 61  BILITOT 0.3 0.2*  PROT 7.7 7.8  ALBUMIN 3.5 3.5    Lab 11/05/11 0355 11/04/11 0730  LIPASE 50 62*  AMYLASE -- --   CBC:  Lab 11/05/11 0355 11/04/11 0730  WBC 5.8 6.0  NEUTROABS -- 3.2  HGB 12.6 12.4  HCT 41.0 40.4  MCV 78.1 78.4  PLT 255 287    Studies: Ct Angio Chest W/cm &/or Wo Cm  10/21/2011  *RADIOLOGY REPORT*  Clinical Data: Chest pain.  Dizziness.  Weakness.  Clinical suspicion for pulmonary embolism.  CT ANGIOGRAPHY CHEST  Technique:  Multidetector CT imaging of the chest using the standard protocol during bolus administration of intravenous contrast. Multiplanar reconstructed images including MIPs were obtained and reviewed to evaluate the vascular anatomy.  Contrast: OMNIPAQUE IOHEXOL 300 MG/ML IV SOLN  Comparison: None.  Findings: Satisfactory opacification of the pulmonary arteries noted, and there is no evidence of pulmonary emboli.  No evidence of thoracic aortic aneurysm or dissection.  No evidence of mediastinal hematoma or mass.  No lymphadenopathy seen elsewhere within the thorax.  There is no evidence of pleural or pericardial effusion.  No evidence of pulmonary infiltrate or mass.  No central endobronchial lesion identified.  IMPRESSION: Negative.  No evidence of pulmonary embolism or other active disease.  Original Report Authenticated By: Danae Orleans, M.D.   US Abdomen Complete  10/21/2011  *RADIOLOGY REPORT*  Clinical Data:  Epigastric pain radiating into the chest and umbilical areas for 3 days.  History of splenic hemangioma.  COMPLETE ABDOMINAL ULTRASOUND  Comparison:  Ultrasound 01/13/2011.  CT 12/01/2008.  Findings:  Gallbladder:  No gallstones, gallbladder wall thickening, or pericholecystic fluid.  Common bile duct:  Normal caliber  extrahepatic bile ducts with normal diameter measured at about 6 mm.  Liver:  Diffusely increased echotexture throughout the liver consistent with infiltrative process such as fatty infiltration. No focal lesions demonstrated.  IVC:  Appears normal.  Pancreas:  Pancreas is not well visualized due to bowel gas and body habitus.  Spleen:  The spleen length measures 9.4 cm.  There is a poorly defined hyperechoic lesion in the superior aspect of the spleen measuring 3.3 x 5.2 x 4.6 cm.  No flow is demonstrated on color flow Doppler images.  The lesion appears stable since the previous ultrasound and CT studies and likely represents hemangioma.  Right Kidney:  Right kidney measures 12.3 cm length.  No hydronephrosis.  Left Kidney:  Left kidney measures 12.3 cm length.  No hydronephrosis.  Abdominal aorta:  Segmental visualization of the abdominal aorta due to bowel gas.  Visualized portions demonstrate normal caliber.  IMPRESSION: Stable appearance of hypoechoic mass in the spleen suggesting hemangioma.  Diffuse coarsening of liver echotexture suggesting infiltrative process such as fatty infiltration.  No evidence of cholelithiasis or cholecystitis.  Original Report Authenticated By: Marlon Pel, M.D.   Ct Abdomen Pelvis W Contrast  11/04/2011  *RADIOLOGY REPORT*  Clinical Data: Shortness of breath and abdominal pain  CT ABDOMEN AND PELVIS WITH CONTRAST  Technique:  Multidetector CT imaging of the abdomen and pelvis was performed following the standard protocol during bolus administration of intravenous contrast.  Contrast: OMNIPAQUE IOHEXOL 300 MG/ML IV SOLN  Comparison: 12/01/2008  Findings: There is atelectasis noted within both lung bases.  Stable low density structure within the splenic parenchyma measuring 4.5 cm.  Likely benign hemangioma.  Both adrenal glands are normal.  The pancreas appears unremarkable.  The gallbladder is normal.  There is no biliary dilatation.  Fatty infiltration of the  right hepatic lobe noted.  No suspicious liver abnormalities noted.  The kidneys are unremarkable.  Scattered upper abdominal lymph nodes.  No adenopathy.  No pelvic or inguinal adenopathy.  Urinary bladder appears normal.  There is an enlarged fibroid uterus identified.  Stomach and the small bowel loops are negative.  The appendix is identified and appears normal.  The colon is normal.  There is no free fluid or fluid collections within the abdomen or pelvis.  Ventral abdominal wall hernia is noted which contains fat only.  There is mild multilevel spondylosis within the lumbar spine.  No fracture or subluxation.  IMPRESSION:  1.  No acute findings identified within the abdomen or pelvis. 2.  Ventral abdominal wall hernia which contains fat only. 3.  Enlarged fibroid uterus.  Original Report Authenticated By: Rosealee Albee, M.D.    Scheduled Meds:    . famotidine  20 mg Oral BID  . nicotine  7 mg Transdermal Daily  . pantoprazole  40 mg Oral Q1200   Continuous Infusions:    . sodium chloride 75 mL/hr at 11/06/11 0432     Assessment/Plan: 1. Epigastric  abdominal pain: Slowly improving. Mild elevation of lipase resolved. Symptoms are most suggestive of pancreatitis although this cannot be confirmed on imaging. Recent abdominal ultrasound is unremarkable. LFTs unremarkable and gallbladder etiology doubted at this point. Plan as below. Differential includes pancreatitis, GERD. 2. Presumed pancreatitis: Slowly improving. Bowel rest. IV fluids. Pain control. Repeat lipase within normal limits. Followup fasting lipid panel. Denies alcohol intake. 3. GERD: Continue Pepcid and PPI. Consider outpatient GI evaluation. 4. Cigarette smoker: Recommend cessation. 5. Recommend establishing with a primary care physician: She does have insurance.  Slow improvement. Continue ice chips today. Consider clear liquid diet tomorrow.  Code Status: Full code  Family Communication: Mr. [place]: [phone]    Disposition Plan: Home when improved.   Brendia Sacks, MD  [facility] Pager [phone] 11/06/2011, 9:39 AM    LOS: 2 days

## 2011-11-07 LAB — LIPID PANEL: LDL Cholesterol: 107 mg/dL — ABNORMAL HIGH (ref 0–99)

## 2011-11-07 MED ORDER — MORPHINE SULFATE 2 MG/ML IJ SOLN: 1.0000 mg | Freq: Once | INTRAMUSCULAR | Status: DC

## 2011-11-07 MED ORDER — ENOXAPARIN SODIUM 40 MG/0.4ML ~~LOC~~ SOLN
40.0000 mg | SUBCUTANEOUS | Status: DC
  Administered 2011-11-07 – 2011-11-08 (×2): 40 mg via SUBCUTANEOUS
  Filled 2011-11-07 (×4): qty 0.4

## 2011-11-07 NOTE — Progress Notes (Signed)
Pt c/o pain, unable to get norco at this time due to would exceed maximum dose of tylenol within 24hr period, morphine given for pain, pt then c/o nausea, zofran not due until 2240, MD on call text paged, orders acknowledged and initiated. To continue to moniter.

## 2011-11-07 NOTE — Progress Notes (Signed)
PROGRESS NOTE  Sharon Swanson ZOX:[id number] DOB: [birth year] DOA: 11/04/2011 PCP: None  Brief narrative: 51 year old woman presented to the emergency department with epigastric pain. She was in the emergency department January 26 and diagnosed with mild pancreatitis. She was discharged home on pain medication.   She reports continued and constant pain over the last 4 weeks. Much worse with eating. Epigastric in location and severe in nature. She has had no nausea or vomiting with this. No diarrhea. No constipation. She has no previous history of GI illness. No history of pancreatitis. Denies alcohol intake.   She was diagnosed presumptively with pancreatitis and referred for admission.  Past medical history: GERD  Consultants:  None  Procedures:  None  Antibiotics:  None  Interim History: Interval documentation reviewed. Nausea with morphine.  Subjective: Slowly improving. Less pain. She would like to try liquids.  Objective: Filed Vitals:   11/06/11 0515 11/06/11 1425 11/06/11 2200 11/07/11 0600  BP: 158/92 157/90 136/84 150/96  Pulse: 71 78 81 78  Temp: 97.9 F (36.6 C) 97.9 F (36.6 C) 97.8 F (36.6 C) 98 F (36.7 C)  TempSrc: Oral Oral Oral Oral  Resp: 18 18 16 16   Height:      Weight:      SpO2: 96% 98% 93% 95%    Intake/Output Summary (Last 24 hours) at 11/07/11 1327 Last data filed at 11/07/11 0700  Gross per 24 hour  Intake 1787.5 ml  Output   1150 ml  Net  637.5 ml    Exam:   General:  Appears calm and comfortable.  Cardiovascular: Regular rate and rhythm. No murmur, rub, gallop.  Respiratory: Clear to auscultation bilaterally. No wheezes, rales, rhonchi. Normal respiratory effort.  Abdomen: Soft, mild epigastric pain. Minimal right upper quadrant pain. Lower abdomen nontender.  Data Reviewed: Basic Metabolic Panel:  Lab 11/06/11 8119 11/05/11 0355 11/04/11 0730  NA 135 135 139  K 4.2 4.0 --  CL 99 99 101  CO2 30 27 25   GLUCOSE 93 108*  106*  BUN 9 9 13   CREATININE 0.70 0.64 0.58  CALCIUM 9.7 9.4 9.4  MG -- -- --  PHOS -- -- --   Liver Function Tests:  Lab 11/05/11 0355 11/04/11 0730  AST 12 15  ALT 17 20  ALKPHOS 62 61  BILITOT 0.3 0.2*  PROT 7.7 7.8  ALBUMIN 3.5 3.5    Lab 11/05/11 0355 11/04/11 0730  LIPASE 50 62*  AMYLASE -- --   CBC:  Lab 11/05/11 0355 11/04/11 0730  WBC 5.8 6.0  NEUTROABS -- 3.2  HGB 12.6 12.4  HCT 41.0 40.4  MCV 78.1 78.4  PLT 255 287    Studies: Ct Angio Chest W/cm &/or Wo Cm  10/21/2011  *RADIOLOGY REPORT*  Clinical Data: Chest pain.  Dizziness.  Weakness.  Clinical suspicion for pulmonary embolism.  CT ANGIOGRAPHY CHEST  Technique:  Multidetector CT imaging of the chest using the standard protocol during bolus administration of intravenous contrast. Multiplanar reconstructed images including MIPs were obtained and reviewed to evaluate the vascular anatomy.  Contrast: OMNIPAQUE IOHEXOL 300 MG/ML IV SOLN  Comparison: None.  Findings: Satisfactory opacification of the pulmonary arteries noted, and there is no evidence of pulmonary emboli.  No evidence of thoracic aortic aneurysm or dissection.  No evidence of mediastinal hematoma or mass.  No lymphadenopathy seen elsewhere within the thorax.  There is no evidence of pleural or pericardial effusion.  No evidence of pulmonary infiltrate or mass.  No  central endobronchial lesion identified.  IMPRESSION: Negative.  No evidence of pulmonary embolism or other active disease.  Original Report Authenticated By: Danae Orleans, M.D.   US Abdomen Complete  10/21/2011  *RADIOLOGY REPORT*  Clinical Data:  Epigastric pain radiating into the chest and umbilical areas for 3 days.  History of splenic hemangioma.  COMPLETE ABDOMINAL ULTRASOUND  Comparison:  Ultrasound 01/13/2011.  CT 12/01/2008.  Findings:  Gallbladder:  No gallstones, gallbladder wall thickening, or pericholecystic fluid.  Common bile duct:  Normal caliber extrahepatic bile ducts  with normal diameter measured at about 6 mm.  Liver:  Diffusely increased echotexture throughout the liver consistent with infiltrative process such as fatty infiltration. No focal lesions demonstrated.  IVC:  Appears normal.  Pancreas:  Pancreas is not well visualized due to bowel gas and body habitus.  Spleen:  The spleen length measures 9.4 cm.  There is a poorly defined hyperechoic lesion in the superior aspect of the spleen measuring 3.3 x 5.2 x 4.6 cm.  No flow is demonstrated on color flow Doppler images.  The lesion appears stable since the previous ultrasound and CT studies and likely represents hemangioma.  Right Kidney:  Right kidney measures 12.3 cm length.  No hydronephrosis.  Left Kidney:  Left kidney measures 12.3 cm length.  No hydronephrosis.  Abdominal aorta:  Segmental visualization of the abdominal aorta due to bowel gas.  Visualized portions demonstrate normal caliber.  IMPRESSION: Stable appearance of hypoechoic mass in the spleen suggesting hemangioma.  Diffuse coarsening of liver echotexture suggesting infiltrative process such as fatty infiltration.  No evidence of cholelithiasis or cholecystitis.  Original Report Authenticated By: Marlon Pel, M.D.   Ct Abdomen Pelvis W Contrast  11/04/2011  *RADIOLOGY REPORT*  Clinical Data: Shortness of breath and abdominal pain  CT ABDOMEN AND PELVIS WITH CONTRAST  Technique:  Multidetector CT imaging of the abdomen and pelvis was performed following the standard protocol during bolus administration of intravenous contrast.  Contrast: OMNIPAQUE IOHEXOL 300 MG/ML IV SOLN  Comparison: 12/01/2008  Findings: There is atelectasis noted within both lung bases.  Stable low density structure within the splenic parenchyma measuring 4.5 cm.  Likely benign hemangioma.  Both adrenal glands are normal.  The pancreas appears unremarkable.  The gallbladder is normal.  There is no biliary dilatation.  Fatty infiltration of the right hepatic lobe noted.   No suspicious liver abnormalities noted.  The kidneys are unremarkable.  Scattered upper abdominal lymph nodes.  No adenopathy.  No pelvic or inguinal adenopathy.  Urinary bladder appears normal.  There is an enlarged fibroid uterus identified.  Stomach and the small bowel loops are negative.  The appendix is identified and appears normal.  The colon is normal.  There is no free fluid or fluid collections within the abdomen or pelvis.  Ventral abdominal wall hernia is noted which contains fat only.  There is mild multilevel spondylosis within the lumbar spine.  No fracture or subluxation.  IMPRESSION:  1.  No acute findings identified within the abdomen or pelvis. 2.  Ventral abdominal wall hernia which contains fat only. 3.  Enlarged fibroid uterus.  Original Report Authenticated By: Rosealee Albee, M.D.    Scheduled Meds:    . famotidine  20 mg Oral BID  . nicotine  7 mg Transdermal Daily  . pantoprazole  40 mg Oral Q1200  . promethazine  25 mg Intravenous Once   Continuous Infusions:    . sodium chloride 75 mL/hr at 11/07/11 0600  Assessment/Plan: 1. Epigastric abdominal pain: Slowly improving. Mild elevation of lipase resolved. Symptoms are most suggestive of pancreatitis although this cannot be confirmed on imaging. Recent abdominal ultrasound is unremarkable. LFTs unremarkable and gallbladder etiology doubted at this point. Plan as below. Differential includes pancreatitis, GERD. 2. Presumed pancreatitis: Slowly improving. Bowel rest. IV fluids. Pain control. Repeat lipase within normal limits. Followup fasting lipid panel. Denies alcohol intake. 3. GERD: Continue Pepcid and PPI. Consider outpatient GI evaluation. 4. Cigarette smoker: Recommend cessation. 5. Recommend establishing with a primary care physician: She does have insurance.  Slow improvement. Clear liquid diet. If tolerates and then advance to full liquid diet tomorrow.  Code Status: Full code  Family Communication:  Mr. [place]: [phone]  Disposition Plan: Home when improved.   Brendia Sacks, MD  [facility] Pager [phone] 11/07/2011, 1:27 PM    LOS: 3 days

## 2011-11-08 ENCOUNTER — Encounter (HOSPITAL_COMMUNITY): Payer: Self-pay | Admitting: Internal Medicine

## 2011-11-08 DIAGNOSIS — Z720 Tobacco use: Secondary | ICD-10-CM | POA: Diagnosis present

## 2011-11-08 DIAGNOSIS — D219 Benign neoplasm of connective and other soft tissue, unspecified: Secondary | ICD-10-CM

## 2011-11-08 DIAGNOSIS — E669 Obesity, unspecified: Secondary | ICD-10-CM

## 2011-11-08 DIAGNOSIS — Z6841 Body Mass Index (BMI) 40.0 and over, adult: Secondary | ICD-10-CM | POA: Diagnosis present

## 2011-11-08 DIAGNOSIS — K760 Fatty (change of) liver, not elsewhere classified: Secondary | ICD-10-CM

## 2011-11-08 DIAGNOSIS — K859 Acute pancreatitis without necrosis or infection, unspecified: Secondary | ICD-10-CM | POA: Diagnosis present

## 2011-11-08 DIAGNOSIS — K219 Gastro-esophageal reflux disease without esophagitis: Secondary | ICD-10-CM | POA: Diagnosis present

## 2011-11-08 HISTORY — DX: Fatty (change of) liver, not elsewhere classified: K76.0

## 2011-11-08 HISTORY — DX: Acute pancreatitis without necrosis or infection, unspecified: K85.90

## 2011-11-08 HISTORY — DX: Benign neoplasm of connective and other soft tissue, unspecified: D21.9

## 2011-11-08 HISTORY — DX: Obesity, unspecified: E66.9

## 2011-11-08 LAB — COMPREHENSIVE METABOLIC PANEL
ALT: 21 U/L (ref 0–35)
AST: 21 U/L (ref 0–37)
CO2: 29 mEq/L (ref 19–32)
Chloride: 100 mEq/L (ref 96–112)
GFR calc non Af Amer: 85 mL/min — ABNORMAL LOW (ref >90)
Potassium: 4 mEq/L (ref 3.5–5.1)
Sodium: 136 mEq/L (ref 135–145)
Total Bilirubin: 0.3 mg/dL (ref 0.3–1.2)

## 2011-11-08 MED ORDER — OXYCODONE HCL 5 MG PO TABS
10.0000 mg | ORAL_TABLET | ORAL | Status: DC | PRN
  Administered 2011-11-08 – 2011-11-09 (×5): 10 mg via ORAL
  Filled 2011-11-08 (×6): qty 2

## 2011-11-08 NOTE — Progress Notes (Signed)
Pt c/o continue to have pain, pt reports no change in pain relief post norco given. Pt states she thinks she is getting "immune" to the pain pills. Requesting different pain pill. IV morphine d/c'd today. MD on call notified, orders received and initiated. Patient declined IV pain med at this time, to take [place] for sleep. To continue to moniter.

## 2011-11-08 NOTE — Progress Notes (Signed)
PATIENT DETAILS Name: Sharon Swanson Age: 51 y.o. Sex: female Date of Birth: 1961/01/02 Admit Date: 11/04/2011 ZOX:WRUE. Emergency contact: Mr. [place]: [phone]    Medical Consults:  None  Other Consults:  Dietician  Interval History: 51 year old woman presented to the emergency department with epigastric pain on 10/04/11. She was in the emergency department January 26 and diagnosed with mild pancreatitis. She was discharged home on pain medication.  She reports continued and constant pain over the last 4 weeks. Much worse with eating. Epigastric in location and severe in nature. She has had no nausea or vomiting with this. No diarrhea. No constipation. She has no previous history of GI illness. No history of pancreatitis. Denies alcohol intake. She was diagnosed presumptively with pancreatitis and referred for admission where she has been treated with bowel rest.  ROS: Mrs. Nyland is still reporting epigastric pain that radiates to her back.  She denies any N/V.  No diarrhea or blood in the stool.  She does endorse a cough and pleuritic chest pain with deep inspiration.   Objective: Vital signs in last 24 hours: Temp:  [97.9 F (36.6 C)] 97.9 F (36.6 C) (02/13 0622) Pulse Rate:  [72-92] 92  (02/13 1121) Resp:  [16-18] 18  (02/13 0622) BP: (142-150)/(74-86) 142/82 mmHg (02/13 1121) SpO2:  [90 %-93 %] 93 % (02/13 0622) Weight change:  Last BM Date: 11/07/11  Intake/Output from previous day:  Intake/Output Summary (Last 24 hours) at 11/08/11 1418 Last data filed at 11/08/11 1314  Gross per 24 hour  Intake    840 ml  Output   2850 ml  Net  -2010 ml     Physical Exam:  Gen:  NAD Cardiovascular:  RRR, No M/R/G Respiratory: Lungs CTAB Gastrointestinal: Abdomen soft, NT/ND with normal active bowel sounds. Extremities: No C/E/C     Lab Results: Basic Metabolic Panel:  Lab 11/08/11 4782 11/06/11 0432 11/05/11 0355 11/04/11 0730  NA 136 135 135 139  K 4.0  4.2 -- --  CL 100 99 99 101  CO2 29 30 27 25   GLUCOSE 99 93 108* 106*  BUN 8 9 9 13   CREATININE 0.80 0.70 0.64 0.58  CALCIUM 9.4 9.7 9.4 9.4  MG -- -- -- --  PHOS -- -- -- --   GFR Estimated Creatinine Clearance: 115.7 ml/min (by C-G formula based on Cr of 0.8). Liver Function Tests:  Lab 11/08/11 0430 11/05/11 0355 11/04/11 0730  AST 21 12 15   ALT 21 17 20   ALKPHOS 58 62 61  BILITOT 0.3 0.3 0.2*  PROT 7.2 7.7 7.8  ALBUMIN 3.4* 3.5 3.5    Lab 11/08/11 0430 11/05/11 0355 11/04/11 0730  LIPASE 37 50 62*  AMYLASE -- -- --    CBC:  Lab 11/05/11 0355 11/04/11 0730  WBC 5.8 6.0  NEUTROABS -- 3.2  HGB 12.6 12.4  HCT 41.0 40.4  MCV 78.1 78.4  PLT 255 287   Lipid Profile  Basename 11/07/11 0341  CHOL 197  HDL 70  LDLCALC 107*  TRIG 99  CHOLHDL 2.8  LDLDIRECT --    Studies/Results: No results found.  Medications: Scheduled Meds:    . enoxaparin (LOVENOX) injection  40 mg Subcutaneous Q24H  . famotidine  20 mg Oral BID  .  morphine injection  1 mg Intravenous Once  . nicotine  7 mg Transdermal Daily  . pantoprazole  40 mg Oral Q1200   Continuous Infusions:  PRN Meds:.acetaminophen, acetaminophen, alum & mag hydroxide-simeth, bisacodyl, hydrALAZINE,  ondansetron (ZOFRAN) IV, ondansetron, oxyCODONE, polyethylene glycol, zolpidem, DISCONTD: HYDROcodone-acetaminophen Antibiotics: Anti-infectives    None       Assessment/Plan:  Active Problems:  Pancreatitis Lipase has normalized. Unclear etiology.  No gallstones.  Abdominal ultrasound done on 10/21/11 with no evidence of cholelithiasis or cholecystitis.  CT abdomen and pelvis did not show any abnormalities related to pancreatitis.  LFTs are not elevated.  Triglycerides not elevated.  Not on any suspicious medications.  Will advance diet and monitor.  GERD (gastroesophageal reflux disease) Continue PPI.  Tobacco abuse Continue nicotine patch.  RN to provide tobacco cessation information.  Fatty liver /  Obesity BMI 39.8 Will ask dietician to see and evaluate.    LOS: 4 days   Hillery Aldo, MD Pager [phone]  11/08/2011, 2:18 PM

## 2011-11-08 NOTE — Progress Notes (Signed)
Nutrition Education  -Met with pt who reports typical intake is of the "wrong foods" but she desires to eat healthier so she doesn't have to feel bad again and be back in the hospital. Pt states she typically does not eat breakfast, has a hot dog or hamburger for lunch, and fried chicken for dinner. Pt states she drinks mostly milk and water, and that the milk is whole milk. Pt states she occasionally eats fruits and tries to get some greens with lunch or dinner. Encouraged pt to cut back on high fat milk and meats and increase fruits and vegetable intake. Provided handout of low fat healthy diet. Discussed healthy meal and snack options, pt thinks she could start doing salads with lean meats for lunch or lean [place] sandwiches with whole grain bread. Pt agreeable to cutting back on whole milk and trying to transition to less fat milk or even skim. Pt states she does not get exercise because she is scheduled to have foot surgery but hopes she can move around more after surgery occurs. Also discussed the relationship between fatty foods and pancreatitis. Pt without any further educational needs.    Nutrition dx:  Nutrition-related knowledge deficit r/t diet therapy AEB MD request  Intervention:  Brief education;  Provided.  Goals of nutrition therapy discussed.  Understanding confirmed.   Monitoring:  Knowledge; for questions.  Please consult RD if new questions present.  Pager: [phone]

## 2011-11-09 ENCOUNTER — Encounter (HOSPITAL_COMMUNITY): Payer: Self-pay | Admitting: Internal Medicine

## 2011-11-09 MED ORDER — PANTOPRAZOLE SODIUM 40 MG PO TBEC: 40.0000 mg | DELAYED_RELEASE_TABLET | Freq: Every day | ORAL | Status: DC

## 2011-11-09 MED ORDER — OXYCODONE HCL 10 MG PO TABS: 10.0000 mg | ORAL_TABLET | ORAL | Status: AC | PRN

## 2011-11-09 MED ORDER — ALPRAZOLAM 0.25 MG PO TABS: 0.2500 mg | ORAL_TABLET | Freq: Every day | ORAL | Status: AC | PRN

## 2011-11-09 NOTE — Discharge Instructions (Signed)
Acute Pancreatitis The pancreas is a large gland located behind your stomach. It produces (secretes) enzymes. These enzymes help digest food. It also releases the hormones glucagon and insulin. These hormones help regulate blood sugar. When the pancreas becomes inflamed, the disease is called pancreatitis. Inflammation of the pancreas occurs when enzymes from the pancreas begin attacking and digesting the pancreas. CAUSES  Most cases ofsudden onset (acute) pancreatitis are caused by:  Alcohol abuse.   Gallstones.  Other less common causes are:  Some medications.   Exposure to certain chemicals   Infection.   Damage caused by an accident (trauma).   Surgery of the belly (abdomen).  SYMPTOMS  Acute pancreatitis usually begins with pain in the upper abdomen and may radiate to the back. This pain may last a couple days. The constant pain varies from mild to severe. The acute form of this disease may vary from mild, nonspecific abdominal pain to profound shock with coma. About 1 in 5 cases are severe. These patients become dehydrated and develop low blood pressure. In severe cases, bleeding into the pancreas can lead to shock and death. The lungs, heart, and kidneys may fail. DIAGNOSIS  Your caregiver will form a clinical opinion after giving you an exam. Laboratory work is used to confirm this diagnosis. Often,a digestive enzyme from the pancreas (serum amylase) and other enzymes are elevated. Sugars and fats (lipids) in the blood may be elevated. There may also be changes in the following levels: calcium, magnesium, potassium, chloride and bicarbonate (chemicals in the blood). X-rays, a CT scan, or ultrasound of your abdomen may be necessary to search for other causes of your abdominal pain. TREATMENT  Most pancreatitis requires treatment of symptoms. Most acute attacks last a couple of days. Your caregiver can discuss the treatment options with you.  If complications occur, hospitalization  may be necessary for pain control and intravenous (IV) fluid replacement.   Sometimes, a tube may be put into the stomach to control vomiting.   Food may not be allowed for 3 to 4 days. This gives the pancreas time to rest. Giving the pancreas a rest means there is no stimulation that would produce more enzymes and cause more damage.   Medicines (antibiotics) that kill germs may be given if infection is the cause.   Sometimes, surgery may be required.   Following an acute attack, your caregiver will determine the cause, if possible, and offer suggestions to prevent recurrences.  HOME CARE INSTRUCTIONS   Eat smaller, more frequent meals. This reduces the amount of digestive juices the pancreas produces.   Decrease the amount of fat in your diet. This may help reduce loose, diarrheal stools.   Drink enough water and fluids to keep your urine clear or pale yellow. This is to avoid dehydration which can cause increased pain.   Talk to your caregiver about pain relievers or other medicines that may help.   Avoid anything that may have triggered your pancreatitis (for example, alcohol).   Follow the diet advised by your caregiver. Do not advance the diet too soon.   Take medicines as prescribed.   Get plenty of rest.   Check your blood sugar at home as directed by your caregiver.   If your caregiver has given you a follow-up appointment, it is very important to keep that appointment. Not keeping the appointment could result in a lasting (chronic) or permanent injury, pain, and disability. If there is any problem keeping the appointment, you must call to reschedule.    SEEK MEDICAL CARE IF:   You are not recovering in the time described by your caregiver.   You have persistent pain, weakness, or feel sick to your stomach (nauseous).   You have recovered and then have another bout of pain.  SEEK IMMEDIATE MEDICAL CARE IF:   You are unable to eat or keep fluids down.   Your pain  increases a lot or changes.   You have an oral temperature above 102 F (38.9 C), not controlled by medicine.   Your skin or the white part of your eyes look yellow (jaundice).   You develop vomiting.   You feel dizzy or faint.   Your blood sugar is high (over 300).  MAKE SURE YOU:   Understand these instructions.   Will watch your condition.   Will get help right away if you are not doing well or get worse.  Document Released: 09/11/2005 Document Revised: 05/24/2011 Document Reviewed: 04/24/2008 [facility] Patient Information 2012 [place], [place].   *If your symptoms return, you will need to be evaluated by a gastroenterologist.  Have Dr. Lovell Sheehan refer you to one, if needed.

## 2011-11-09 NOTE — Discharge Summary (Signed)
Physician Discharge Summary  Patient ID: Sharon Swanson MRN: [id number] DOB/AGE: [birth year] 51 y.o.  Admit date: 11/04/2011 Discharge date: 11/09/2011  Primary Care Physician:  No primary provider on file.  The patient was referred to Dr. Della Swanson.   Discharge Diagnoses:    Present on Admission:  .Pancreatitis .GERD (gastroesophageal reflux disease) .Tobacco abuse .Fatty liver .Obesity (BMI 30-39.9) .Fibroids  Discharge Medications:  Medication List  As of 11/09/2011  2:28 PM   TAKE these medications         ALPRAZolam 0.25 MG tablet   Commonly known as: XANAX   Take 1 tablet (0.25 mg total) by mouth daily as needed for anxiety.      bismuth subsalicylate 262 MG/15ML suspension   Commonly known as: PEPTO BISMOL   Take 15 mLs by mouth every 6 (six) hours as needed. Upset stomach      Oxycodone HCl 10 MG Tabs   Take 1 tablet (10 mg total) by mouth every 4 (four) hours as needed.      pantoprazole 40 MG tablet   Commonly known as: PROTONIX   Take 1 tablet (40 mg total) by mouth daily at 12 noon.             Disposition and Follow-up: The patient is being discharged home.  She is instructed to call Dr. Lovell Swanson' office in one week to schedule a follow up appointment.   Medical Consults:  None  Other Consults:  Dietician: Dietary instruction provided.  Nursing: Tobacco cessation information provided.  Significant Diagnostic Studies:  Ct Angio Chest W/cm &/or Wo Cm  10/21/2011    IMPRESSION: Negative.  No evidence of pulmonary embolism or other active disease.  Original Report Authenticated By: Sharon Swanson, M.D.   US Abdomen Complete  10/21/2011  IMPRESSION: Stable appearance of hypoechoic mass in the spleen suggesting hemangioma.  Diffuse coarsening of liver echotexture suggesting infiltrative process such as fatty infiltration.  No evidence of cholelithiasis or cholecystitis.  Original Report Authenticated By: Sharon Swanson, M.D.   Ct Abdomen  Pelvis W Contrast  11/04/2011    IMPRESSION:  1.  No acute findings identified within the abdomen or pelvis. 2.  Ventral abdominal wall hernia which contains fat only. 3.  Enlarged fibroid uterus.  Original Report Authenticated By: Sharon Swanson, M.D.    Discharge Laboratory Values: Basic Metabolic Panel:  Lab 11/08/11 5284 11/06/11 0432 11/05/11 0355 11/04/11 0730  NA 136 135 135 139  K 4.0 4.2 -- --  CL 100 99 99 101  CO2 29 30 27 25   GLUCOSE 99 93 108* 106*  BUN 8 9 9 13   CREATININE 0.80 0.70 0.64 0.58  CALCIUM 9.4 9.7 9.4 9.4  MG -- -- -- --  PHOS -- -- -- --   GFR Estimated Creatinine Clearance: 115.7 ml/min (by C-G formula based on Cr of 0.8). Liver Function Tests:  Lab 11/08/11 0430 11/05/11 0355 11/04/11 0730  AST 21 12 15   ALT 21 17 20   ALKPHOS 58 62 61  BILITOT 0.3 0.3 0.2*  PROT 7.2 7.7 7.8  ALBUMIN 3.4* 3.5 3.5    Lab 11/08/11 0430 11/05/11 0355 11/04/11 0730  LIPASE 37 50 62*  AMYLASE -- -- --    CBC:  Lab 11/05/11 0355 11/04/11 0730  WBC 5.8 6.0  NEUTROABS -- 3.2  HGB 12.6 12.4  HCT 41.0 40.4  MCV 78.1 78.4  PLT 255 287   Lipid Profile  Basename 11/07/11 0341  CHOL 197  HDL  70  LDLCALC 107*  TRIG 99  CHOLHDL 2.8  LDLDIRECT --     Brief H and P: For complete details please refer to admission H and P, but in brief, Sharon Swanson is a 51 year old woman who presented to the emergency department with epigastric pain on 10/04/11. She was previously treated in the emergency department January 26 and diagnosed with mild pancreatitis. She was discharged home on pain medication. She reports continued and constant pain over the last 4 weeks. Much worse with eating. Epigastric in location and severe in nature. She has had no nausea or vomiting with this. No diarrhea. No constipation. She has no previous history of GI illness. No history of pancreatitis. Denies alcohol intake. She was diagnosed presumptively with pancreatitis and referred for admission where  she has been treated with bowel rest.   Physical Exam at Discharge: BP 150/80  Pulse 72  Temp(Src) 98.3 F (36.8 C) (Oral)  Resp 18  Ht 5' 8.5" (1.74 m)  Wt 120.203 kg (265 lb)  BMI 39.71 kg/m2  SpO2 98% Gen:  NAD Cardiovascular:  RRR, No M/R/G Respiratory: Lungs CTAB Gastrointestinal: Abdomen soft, NT/ND with normal active bowel sounds. Extremities: No C/E/C  Hospital Course:  Active Problems:  Pancreatitis  The patient was admitted and put on bowel rest.  Lipase has normalized. Unclear etiology. No gallstones. Abdominal ultrasound done on 10/21/11 with no evidence of cholelithiasis or cholecystitis. CT abdomen and pelvis did not show any abnormalities related to pancreatitis. LFTs are not elevated. Triglycerides not elevated. Not on any suspicious medications. The patient tolerated advancement of her diet on 11/08/11, and will be discharged home today.  GERD (gastroesophageal reflux disease)  Continue PPI.  Tobacco abuse  The patient was given a nicotine patch. RN provided tobacco cessation information.  Fatty liver / Obesity BMI 39.8  Status post dietician evaluation and teaching.   Recommendations for hospital follow-up: 1.  Outpatient GI referral if symptoms persist.  Diet:  Heart healthy  Activity:  No restrictions  Condition at Discharge:   Stable  Time spent on Discharge:  35 minutes  Signed: Dr. Trula Ore Agustus Swanson Pager [phone] 11/09/2011, 2:28 PM

## 2011-11-09 NOTE — Progress Notes (Signed)
Patient given smoking cessation education packet, Patient  verbalizes understanding.

## 2012-01-02 ENCOUNTER — Emergency Department (HOSPITAL_COMMUNITY)
Admission: EM | Admit: 2012-01-02 | Discharge: 2012-01-02 | Disposition: A | Discharge status: Home / Self Care | Payer: BC Managed Care – PPO | Attending: Emergency Medicine | Admitting: Emergency Medicine

## 2012-01-02 DIAGNOSIS — M545 Low back pain, unspecified: Secondary | ICD-10-CM | POA: Insufficient documentation

## 2012-01-02 DIAGNOSIS — E669 Obesity, unspecified: Secondary | ICD-10-CM | POA: Insufficient documentation

## 2012-01-02 DIAGNOSIS — IMO0001 Reserved for inherently not codable concepts without codable children: Secondary | ICD-10-CM | POA: Insufficient documentation

## 2012-01-02 DIAGNOSIS — M791 Myalgia, unspecified site: Secondary | ICD-10-CM

## 2012-01-02 DIAGNOSIS — F17200 Nicotine dependence, unspecified, uncomplicated: Secondary | ICD-10-CM | POA: Insufficient documentation

## 2012-01-02 DIAGNOSIS — K219 Gastro-esophageal reflux disease without esophagitis: Secondary | ICD-10-CM | POA: Insufficient documentation

## 2012-01-02 DIAGNOSIS — M25519 Pain in unspecified shoulder: Secondary | ICD-10-CM | POA: Insufficient documentation

## 2012-01-02 DIAGNOSIS — Z886 Allergy status to analgesic agent status: Secondary | ICD-10-CM | POA: Insufficient documentation

## 2012-01-02 DIAGNOSIS — I10 Essential (primary) hypertension: Secondary | ICD-10-CM | POA: Insufficient documentation

## 2012-01-02 MED ORDER — IBUPROFEN 800 MG PO TABS: 800.0000 mg | ORAL_TABLET | Freq: Three times a day (TID) | ORAL | Status: AC

## 2012-01-02 MED ORDER — HYDROCODONE-ACETAMINOPHEN 5-500 MG PO TABS: 1.0000 | ORAL_TABLET | Freq: Four times a day (QID) | ORAL | Status: AC | PRN

## 2012-01-02 MED ORDER — CYCLOBENZAPRINE HCL 10 MG PO TABS: 10.0000 mg | ORAL_TABLET | Freq: Two times a day (BID) | ORAL | Status: AC | PRN
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
## 2012-01-02 NOTE — ED Notes (Signed)
Reviewed discharge instructions and Rx for Motrin, Flexeril and Vicodin.

## 2012-01-02 NOTE — Discharge Instructions (Signed)
Myalgia, Adult  Myalgia is the medical term for muscle pain. It is a symptom of many things. Nearly everyone at some time in their life has this. The most common cause for muscle pain is overuse or straining and more so when you are not in shape. Injuries and muscle bruises cause myalgias. Muscle pain without a history of injury can also be caused by a virus. It frequently comes along with the flu. Myalgia not caused by muscle strain can be present in a large number of infectious diseases. Some autoimmune diseases like lupus and fibromyalgia can cause muscle pain. Myalgia may be mild, or severe.  SYMPTOMS   The symptoms of myalgia are simply muscle pain. Most of the time this is short lived and the pain goes away without treatment.  DIAGNOSIS   Myalgia is diagnosed by your caregiver by taking your history. This means you tell him when the problems began, what they are, and what has been happening. If this has not been a long term problem, your caregiver may want to watch for a while to see what will happen. If it has been long term, they may want to do additional testing.  TREATMENT   The treatment depends on what the underlying cause of the muscle pain is. Often anti-inflammatory medications will help.  HOME CARE INSTRUCTIONS   If the pain in your muscles came from overuse, slow down your activities until the problems go away.   Myalgia from overuse of a muscle can be treated with alternating hot and cold packs on the muscle affected or with cold for the first couple days. If either heat or cold seems to make things worse, stop their use.   Apply ice to the sore area for 15 to 20 minutes, 3 to 4 times per day, while awake for the first 2 days of muscle soreness, or as directed. Put the ice in a plastic bag and place a towel between the bag of ice and your skin.   Only take over-the-counter or prescription medicines for pain, discomfort, or fever as directed by your caregiver.   Regular gentle exercise may help  if you are not active.   Stretching before strenuous exercise can help lower the risk of myalgia. It is normal when beginning an exercise regimen to feel some muscle pain after exercising. Muscles that have not been used frequently will be sore at first. If the pain is extreme, this may mean injury to a muscle.  SEEK MEDICAL CARE IF:   You have an increase in muscle pain that is not relieved with medication.   You begin to run a temperature.   You develop nausea and vomiting.   You develop a stiff and painful neck.   You develop a rash.   You develop muscle pain after a tick bite.   You have continued muscle pain while working out even after you are in good condition.  SEEK IMMEDIATE MEDICAL CARE IF:  Any of your problems are getting worse and medications are not helping.  MAKE SURE YOU:    Understand these instructions.   Will watch your condition.   Will get help right away if you are not doing well or get worse.  Document Released: 08/03/2006 Document Revised: 08/31/2011 Document Reviewed: 10/23/2006  [employer] Patient Information 2012 [employer].

## 2012-01-03 NOTE — ED Provider Notes (Signed)
Medical screening examination/treatment/procedure(s) were performed by non-physician practitioner and as supervising physician I was immediately available for consultation/collaboration.  Cheri Guppy, MD 01/03/12 [phone]

## 2012-02-05 ENCOUNTER — Ambulatory Visit: Payer: BC Managed Care – PPO | Admitting: Physical Therapy

## 2012-02-15 ENCOUNTER — Ambulatory Visit: Payer: BC Managed Care – PPO | Admitting: Physical Therapy

## 2012-06-01 ENCOUNTER — Emergency Department (HOSPITAL_COMMUNITY): Payer: BC Managed Care – PPO

## 2012-06-01 ENCOUNTER — Emergency Department (HOSPITAL_COMMUNITY)
Admission: EM | Admit: 2012-06-01 | Discharge: 2012-06-01 | Disposition: A | Discharge status: Home / Self Care | Payer: BC Managed Care – PPO | Attending: Emergency Medicine | Admitting: Emergency Medicine

## 2012-06-01 ENCOUNTER — Encounter (HOSPITAL_COMMUNITY): Payer: Self-pay | Admitting: *Deleted

## 2012-06-01 DIAGNOSIS — E669 Obesity, unspecified: Secondary | ICD-10-CM | POA: Insufficient documentation

## 2012-06-01 DIAGNOSIS — K429 Umbilical hernia without obstruction or gangrene: Secondary | ICD-10-CM | POA: Insufficient documentation

## 2012-06-01 DIAGNOSIS — K5900 Constipation, unspecified: Secondary | ICD-10-CM | POA: Insufficient documentation

## 2012-06-01 DIAGNOSIS — I10 Essential (primary) hypertension: Secondary | ICD-10-CM | POA: Insufficient documentation

## 2012-06-01 DIAGNOSIS — K7689 Other specified diseases of liver: Secondary | ICD-10-CM | POA: Insufficient documentation

## 2012-06-01 DIAGNOSIS — R0602 Shortness of breath: Secondary | ICD-10-CM | POA: Insufficient documentation

## 2012-06-01 DIAGNOSIS — R109 Unspecified abdominal pain: Secondary | ICD-10-CM | POA: Insufficient documentation

## 2012-06-01 DIAGNOSIS — Z6830 Body mass index (BMI) 30.0-30.9, adult: Secondary | ICD-10-CM | POA: Insufficient documentation

## 2012-06-01 DIAGNOSIS — K219 Gastro-esophageal reflux disease without esophagitis: Secondary | ICD-10-CM | POA: Insufficient documentation

## 2012-06-01 DIAGNOSIS — Z79899 Other long term (current) drug therapy: Secondary | ICD-10-CM | POA: Insufficient documentation

## 2012-06-01 DIAGNOSIS — F17200 Nicotine dependence, unspecified, uncomplicated: Secondary | ICD-10-CM | POA: Insufficient documentation

## 2012-06-01 LAB — CBC WITH DIFFERENTIAL/PLATELET
Basophils Absolute: 0 10*3/uL (ref 0.0–0.1)
Lymphocytes Relative: 28 % (ref 12–46)
Lymphs Abs: 1.3 10*3/uL (ref 0.7–4.0)
MCV: 78.6 fL (ref 78.0–100.0)
Neutro Abs: 2.5 10*3/uL (ref 1.7–7.7)
Neutrophils Relative %: 56 % (ref 43–77)
Platelets: 240 10*3/uL (ref 150–400)
RBC: 5.42 MIL/uL — ABNORMAL HIGH (ref 3.87–5.11)
RDW: 18.5 % — ABNORMAL HIGH (ref 11.5–15.5)
WBC: 4.5 10*3/uL (ref 4.0–10.5)

## 2012-06-01 LAB — URINALYSIS, ROUTINE W REFLEX MICROSCOPIC
Hgb urine dipstick: NEGATIVE
Leukocytes, UA: NEGATIVE
Protein, ur: 30 mg/dL — AB
Specific Gravity, Urine: 1.028 (ref 1.005–1.030)
Urobilinogen, UA: 1 mg/dL (ref 0.0–1.0)

## 2012-06-01 LAB — URINE MICROSCOPIC-ADD ON

## 2012-06-01 LAB — COMPREHENSIVE METABOLIC PANEL
ALT: 22 U/L (ref 0–35)
AST: 19 U/L (ref 0–37)
Calcium: 9.7 mg/dL (ref 8.4–10.5)
Creatinine, Ser: 0.59 mg/dL (ref 0.50–1.10)
GFR calc Af Amer: >90 mL/min (ref >90)
GFR calc non Af Amer: >90 mL/min (ref >90)
Glucose, Bld: 132 mg/dL — ABNORMAL HIGH (ref 70–99)
Sodium: 138 mEq/L (ref 135–145)
Total Protein: 7.5 g/dL (ref 6.0–8.3)

## 2012-06-01 LAB — POCT I-STAT TROPONIN I: Troponin i, poc: 0 ng/mL (ref 0.00–0.08)

## 2012-06-01 MED ORDER — DOCUSATE SODIUM 100 MG PO CAPS: 100.0000 mg | ORAL_CAPSULE | Freq: Two times a day (BID) | ORAL | Status: AC

## 2012-06-01 MED ORDER — PANTOPRAZOLE SODIUM 20 MG PO TBEC: 20.0000 mg | DELAYED_RELEASE_TABLET | Freq: Every day | ORAL | Status: DC

## 2012-06-01 MED ORDER — SODIUM CHLORIDE 0.9 % IV SOLN: Freq: Once | INTRAVENOUS | Status: DC

## 2012-06-01 MED ORDER — MORPHINE SULFATE 4 MG/ML IJ SOLN
6.0000 mg | Freq: Once | INTRAMUSCULAR | Status: AC
  Administered 2012-06-01: 6 mg via INTRAVENOUS
  Filled 2012-06-01: qty 1

## 2012-06-01 MED ORDER — MORPHINE SULFATE 2 MG/ML IJ SOLN
INTRAMUSCULAR | Status: AC
  Administered 2012-06-01: 2 mg via INTRAVENOUS
  Filled 2012-06-01: qty 1

## 2012-06-01 MED ORDER — FAMOTIDINE 20 MG PO TABS: 20.0000 mg | ORAL_TABLET | Freq: Two times a day (BID) | ORAL | Status: DC

## 2012-06-01 MED ORDER — OXYCODONE-ACETAMINOPHEN 5-325 MG PO TABS: 2.0000 | ORAL_TABLET | ORAL | Status: AC | PRN

## 2012-06-01 MED ORDER — IOHEXOL 300 MG/ML  SOLN
100.0000 mL | Freq: Once | INTRAMUSCULAR | Status: AC | PRN
  Administered 2012-06-01: 100 mL via INTRAVENOUS

## 2012-06-01 MED ORDER — HYDROMORPHONE HCL PF 1 MG/ML IJ SOLN
1.0000 mg | Freq: Once | INTRAMUSCULAR | Status: AC
  Administered 2012-06-01: 1 mg via INTRAVENOUS
  Filled 2012-06-01: qty 1

## 2012-06-01 MED ORDER — SENNOSIDES-DOCUSATE SODIUM 8.6-50 MG PO TABS: 1.0000 | ORAL_TABLET | Freq: Every day | ORAL | Status: DC | PRN

## 2012-06-01 MED ORDER — SODIUM CHLORIDE 0.9 % IV BOLUS (SEPSIS)
1000.0000 mL | Freq: Once | INTRAVENOUS | Status: AC
  Administered 2012-06-01: 1000 mL via INTRAVENOUS

## 2012-06-01 NOTE — ED Notes (Signed)
Pt reports epigastric pain that radiates to her L upper chest x 1 month, denies seeing anyone for this before.  Pt reports this time pain started last night, unable to sleep last night d/t pain.  Pt also reports mild SOB and lightheadedness intermittently.  Pt is a smoker.  Pt describes pain as sharp pain.  Pt also reports chest pressure.

## 2012-06-01 NOTE — ED Notes (Signed)
RN to obtain labs with start of IV 

## 2012-06-01 NOTE — ED Provider Notes (Signed)
History     CSN: [id number]  Arrival date & time 06/01/12  1024   First MD Initiated Contact with Patient 06/01/12 1025      Chief Complaint  Patient presents with  . Chest Pain    (Consider location/radiation/quality/duration/timing/severity/associated sxs/prior treatment) The history is provided by the patient.  Sharon Swanson is a 51 y.o. female hx of pancreatitis here with epigastric pain and chest pain. Epigastric pain x 1 month and substernal chest pain, no radiation. For the last 3 weeks, she had decreased po intake and felt nauseous. She felt the same as her pancreatitis in February. She denies alcohol use and has no hx of gallstones. She said that the pain is not worse with exertion. It was initially intermittent, but for the last 2 days its been constant. She is unable to sleep from the pain yesterday. No fever or diarrhea or urinary symptoms. Her cardiac risk factor include HTN (not on meds), + smoker.    Past Medical History  Diagnosis Date  . Acid reflux   . Hypertension   . Fibroids 11/08/2011  . Obesity (BMI 30-39.9) 11/08/2011  . Fatty liver 11/08/2011  . Pancreatitis 11/08/2011  . Ventral hernia     Past Surgical History  Procedure Date  . Orthopedic surgery     R ankle, tendonitis    Family History  Problem Relation Age of Onset  . Hypertension Mother     History  Substance Use Topics  . Smoking status: Current Everyday Smoker -- 0.3 packs/day    Types: Cigarettes  . Smokeless tobacco: Not on file  . Alcohol Use: No    OB History    Grav Para Term Preterm Abortions TAB SAB Ect Mult Living                  Review of Systems  Cardiovascular: Positive for chest pain.  Gastrointestinal: Positive for nausea and abdominal pain.  All other systems reviewed and are negative.    Allergies  Aspirin  Home Medications   Current Outpatient Rx  Name Route Sig Dispense Refill  . NAPROXEN SODIUM 220 MG PO TABS Oral Take 220 mg by mouth 2 (two) times  daily as needed. For pain    . DOCUSATE SODIUM 100 MG PO CAPS Oral Take 1 capsule (100 mg total) by mouth every 12 (twelve) hours. 60 capsule 0  . FAMOTIDINE 20 MG PO TABS Oral Take 1 tablet (20 mg total) by mouth 2 (two) times daily. 10 tablet 0  . OXYCODONE-ACETAMINOPHEN 5-325 MG PO TABS Oral Take 2 tablets by mouth every 4 (four) hours as needed for pain. 10 tablet 0  . PANTOPRAZOLE SODIUM 20 MG PO TBEC Oral Take 1 tablet (20 mg total) by mouth daily. 15 tablet 0  . SENNOSIDES-DOCUSATE SODIUM 8.6-50 MG PO TABS Oral Take 1 tablet by mouth daily as needed for constipation. 15 tablet 0    BP 151/81  Pulse 82  Temp 98.9 F (37.2 C) (Oral)  Resp 20  SpO2 96%  Physical Exam  Nursing note and vitals reviewed. Constitutional: She is oriented to person, place, and time.       Uncomfortable, obese  HENT:  Head: Normocephalic.  Mouth/Throat: Oropharynx is clear and moist.  Eyes: Conjunctivae are normal. Pupils are equal, round, and reactive to light.  Neck: Normal range of motion. Neck supple.  Cardiovascular: Normal rate, regular rhythm and normal heart sounds.   Pulmonary/Chest: Effort normal and breath sounds normal.  Abdominal: Soft.  Distended, + tender in epigastric area, no rebound.   Musculoskeletal: Normal range of motion.  Neurological: She is alert and oriented to person, place, and time.  Skin: Skin is warm and dry.  Psychiatric: She has a normal mood and affect. Her behavior is normal. Judgment and thought content normal.    ED Course  Procedures (including critical care time)  Labs Reviewed  COMPREHENSIVE METABOLIC PANEL - Abnormal; Notable for the following:    Glucose, Bld 132 (*)     Total Bilirubin 0.2 (*)     All other components within normal limits  URINALYSIS, ROUTINE W REFLEX MICROSCOPIC - Abnormal; Notable for the following:    APPearance CLOUDY (*)     Protein, ur 30 (*)     All other components within normal limits  CBC WITH DIFFERENTIAL -  Abnormal; Notable for the following:    RBC 5.42 (*)     MCH 24.7 (*)     RDW 18.5 (*)     All other components within normal limits  URINE MICROSCOPIC-ADD ON - Abnormal; Notable for the following:    Bacteria, UA MANY (*)     Casts HYALINE CASTS (*)     All other components within normal limits  LIPASE, BLOOD  POCT I-STAT TROPONIN I  POCT I-STAT TROPONIN I   Dg Chest 2 View  06/01/2012  *RADIOLOGY REPORT*  Clinical Data: Chest pain under left breast, short of breath, smoker  CHEST - 2 VIEW  Comparison: Chest CT 10/21/2011; prior chest x-ray 01/13/2011  Findings: Negative for pulmonary edema, or focal airspace disease. No pneumothorax, or pleural effusion.  Mild cardiomegaly, unchanged.  Confluent flowing anterior osteophytes along the thoracic spine consistent with dystrophic idiopathic skeletal hyperostosis (DISH).  IMPRESSION:  No acute cardiopulmonary disease   Original Report Authenticated By: Vilma Prader    US Abdomen Complete  06/01/2012  *RADIOLOGY REPORT*  Clinical Data:  Right upper quadrant pain.  ABDOMINAL ULTRASOUND COMPLETE  Comparison:  CT abdomen pelvis 11/04/2011  Findings:  Gallbladder:  No gallstones, gallbladder wall thickening, or pericholecystic fluid. Sonographic Murphy's sign is negative.  Common Bile Duct:  Within normal limits in caliber. Measures 5 mm.  Liver: No focal mass lesion identified. Diffusely coarsened and increased echotexture, with some decreased penetration of the ultrasound beam suggest fatty infiltration of the liver. Negative for intrahepatic biliary duct dilatation.  IVC:  Appears normal.  Pancreas:  Although the pancreas is difficult to visualize in its entirety, no focal pancreatic abnormality is identified.  Spleen:  Within normal limits in size.  Measures 10.5 cm in sagittal length.  A 3.6 x 3.9 x 4.8 cm area of increased echogenicity within the superior aspect of the spleen corresponds to a low density structure seen on prior CT.  This likely reflects a  benign hemangioma.  Right kidney:  Normal in size and parenchymal echogenicity.  No evidence of mass or hydronephrosis.  Left kidney:  Normal in size and parenchymal echogenicity.  No evidence of mass or hydronephrosis.  Abdominal Aorta:  No aneurysm identified.  IMPRESSION:  1.  No acute findings.  Negative for cholelithiasis or biliary ductal dilatation. 2.  Fatty infiltration of the liver. 3.  Probable splenic hemangioma, appears stable compared to prior abdominal CT.   Original Report Authenticated By: Britta Mccreedy, M.D.    Ct Abdomen Pelvis W Contrast  06/01/2012  *RADIOLOGY REPORT*  Clinical Data: Abdominal pain.  CT ABDOMEN AND PELVIS WITH CONTRAST  Technique:  Multidetector CT imaging of the abdomen  and pelvis was performed following the standard protocol during bolus administration of intravenous contrast.  Contrast: OMNIPAQUE IOHEXOL 300 MG/ML  SOLN  Comparison: CT abdomen pelvis 11/04/2011 and 12/01/2008 and abdominal ultrasound 06/01/2012.  Findings: The lung bases are clear.  Diffuse fatty infiltration of the liver.  Stable 4.5 cm hypodense lesion in the superior aspect of the spleen, unchanged dating back to CT of 2010, and most likely a benign splenic hemangioma.  The spleen is normal in size.  Normal gallbladder, adrenal glands, pancreas, and common bile duct. Portal vein is patent.  Abdominal aorta is normal in caliber and enhancement.  A small supra-umbilical hernia contains fat and mesenteric vessels only and is stable.  Hernia sac measures 4.9 x 3.1 x 4.1 cm.  Stomach is decompressed.  Small bowel loops are normal in caliber. There is fecal material the terminal ileum.  No evidence of ileal wall thickening.  Multiple scattered colonic diverticula.  No associated inflammatory changes. Normal appendix (images 54-59). Normal rectum.  Lobular contour of the uterus suggests uterine fibroids.  No adnexal mass.  Negative for ascites or lymphadenopathy.  The vertebral bodies are normal in height  alignment.  There are some posterior calcified disc osteophyte complexes at L2-3, L3-4, and L4-5.  No acute or suspicious bony abnormality.  IMPRESSION:  1.  No acute findings in the abdomen or pelvis. 2.  Fatty infiltration of the liver. 3.  Stable splenic lesion, likely a hemangioma, unchanged dating back to 2010. 4.  Normal appendix. 5.  Suspect fibroid uterus. 6. Fecalization of the terminal ileum.  This can be seen in patients with history of chronic constipation or chronic low grade bowel obstruction.  The patient's bowel loops are normal in caliber.  7.  Supraumbilical hernia contains fat and mesenteric vessels only, stable.   Original Report Authenticated By: Britta Mccreedy, M.D.      1. Constipation   2. Abdominal pain      Date: 06/01/2012  Rate: 82  Rhythm: normal sinus rhythm  QRS Axis: left  Intervals: normal  ST/T Wave abnormalities: TWI laterally, new since 2/13  Conduction Disutrbances:none  Narrative Interpretation:   Old EKG Reviewed: changes noted       MDM  Mardel Grudzien is a 51 y.o. female hx of pancreatitis, HTN here with abdominal pain, chest pain. The etiology of her pain is likely from pancreatitis, but also consider ACS in the setting of new TWI in lateral leads. Will get cbc, cmp, lipase, trop x 2. Will provide pain control.   3:50 PM Patient now feels well, tolerated PO contrast. CBC, CMP nl, lipase nl. CXR nl, Trop neg x 2. RUQ US showed no acute chole. CT ab/pel showed likely chronic constipation. Her abdomen soft and nontender, unlikely to have chronic low grade obstruction. Her pain is likely from constipation vs PUD. Will give senna, colace, protonix, pepcid for symptomatic relief. I referred her to GI.         Richardean Canal, MD 06/01/12 [id number]

## 2012-06-01 NOTE — ED Notes (Signed)
Pt rates pain at 9/10.  [place] at bedside.  Pt appears calm and comfortable at this time.

## 2012-06-03 ENCOUNTER — Telehealth: Payer: Self-pay | Admitting: Gastroenterology

## 2012-06-03 NOTE — Telephone Encounter (Signed)
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  She was seen in the ER on 06/01/12 and advised that she has constipation.  She wants to see GI.  Patient is offered an appt this week with Willette Cluster RNP, but she declines because she can't be seen until after 3:30 on Friday pm due to work.  She is scheduled for Dr. Rhea Belton for 07/02/12.  She will call back if she decides she wants an extender appt and wants to get off work.

## 2012-07-01 ENCOUNTER — Encounter: Payer: Self-pay | Admitting: Internal Medicine

## 2012-07-02 ENCOUNTER — Encounter: Payer: Self-pay | Admitting: Internal Medicine

## 2012-07-02 ENCOUNTER — Ambulatory Visit (INDEPENDENT_AMBULATORY_CARE_PROVIDER_SITE_OTHER): Payer: BC Managed Care – PPO | Admitting: Internal Medicine

## 2012-07-02 ENCOUNTER — Other Ambulatory Visit (INDEPENDENT_AMBULATORY_CARE_PROVIDER_SITE_OTHER): Payer: BC Managed Care – PPO

## 2012-07-02 VITALS — BP 150/88 | HR 96 | Ht 67.75 in | Wt 287.4 lb

## 2012-07-02 DIAGNOSIS — R1013 Epigastric pain: Secondary | ICD-10-CM

## 2012-07-02 DIAGNOSIS — Z1211 Encounter for screening for malignant neoplasm of colon: Secondary | ICD-10-CM

## 2012-07-02 DIAGNOSIS — K5909 Other constipation: Secondary | ICD-10-CM

## 2012-07-02 DIAGNOSIS — K5900 Constipation, unspecified: Secondary | ICD-10-CM

## 2012-07-02 MED ORDER — LINACLOTIDE 145 MCG PO CAPS: 145.0000 ug | ORAL_CAPSULE | Freq: Every day | ORAL | Status: DC

## 2012-07-02 MED ORDER — PEG-KCL-NACL-NASULF-NA ASC-C 100 G PO SOLR: 1.0000 | Freq: Once | ORAL | Status: DC

## 2012-07-02 MED ORDER — ESOMEPRAZOLE MAGNESIUM 40 MG PO CPDR: 40.0000 mg | DELAYED_RELEASE_CAPSULE | Freq: Every day | ORAL | Status: DC

## 2012-07-02 NOTE — Progress Notes (Signed)
Patient ID: Sharon Swanson, female   DOB: April 28, 1961, 51 y.o.   MRN: [id number]  SUBJECTIVE: HPI Sharon Swanson is a 51 year old female with a past medical history of hypertension, uterine fibroids, fatty liver, pancreatitis and GERD who is seen for evaluation of epigastric abdominal pain and chronic constipation.  The patient reports epigastric abdominal pain which radiates to her left upper quadrant which is been worsening over the last 2 months. This pain is worse 5 minutes after eating, but can last all day. She feels feel anything that makes it better is not eating. She denies nausea and vomiting. She does have a long-standing history of heartburn, but denies dysphagia or odynophagia. She was previously taking pantoprazole 20 mg which helps some but has run out of this medicine.  She does report water brash. No weight loss. She does report severe constipation for many years. She has not been using laxatives on a consistent basis, but has been using docusate 100 mg twice a day given to her by the ER physician. The Colace has not helped her. She denies blood in her stool or melena. She does report a history of hemorrhoids. She denies diarrhea. No family history of colorectal cancer. She does take Aleve 2 tablets daily.  She's never had an endoscopy or colonoscopy  Review of Systems  As per history of present illness, otherwise negative   Past Medical History  Diagnosis Date  . Acid reflux   . Hypertension   . Fibroids 11/08/2011  . Obesity (BMI 30-39.9) 11/08/2011  . Fatty liver 11/08/2011  . Pancreatitis 11/08/2011  . Ventral hernia     Current Outpatient Prescriptions  Medication Sig Dispense Refill  . docusate sodium (COLACE) 100 MG capsule Take 100 mg by mouth every 12 (twelve) hours.      . famotidine (PEPCID) 20 MG tablet Take 1 tablet (20 mg total) by mouth 2 (two) times daily.  10 tablet  0  . naproxen sodium (ANAPROX) 220 MG tablet Take 220 mg by mouth 2 (two) times daily as needed. For  pain      . esomeprazole (NEXIUM) 40 MG capsule Take 1 capsule (40 mg total) by mouth daily.  30 capsule  1  . Linaclotide (LINZESS) 145 MCG CAPS Take 1 capsule (145 mcg total) by mouth daily.  30 capsule  6  . peg 3350 powder (MOVIPREP) 100 G SOLR Take 1 kit (100 g total) by mouth once.  1 kit  0    Allergies  Allergen Reactions  . Aspirin     Abdominal pain    Family History  Problem Relation Age of Onset  . Hypertension Mother     History  Substance Use Topics  . Smoking status: Current Every Day Smoker -- 0.3 packs/day    Types: Cigarettes  . Smokeless tobacco: Never Used  . Alcohol Use: No    OBJECTIVE: BP 150/88  Pulse 96  Ht 5' 7.75" (1.721 m)  Wt 287 lb 6.4 oz (130.364 kg)  BMI 44.02 kg/m2 Constitutional: Well-developed and well-nourished. No distress. HEENT: Normocephalic and atraumatic. Oropharynx is clear and moist. No oropharyngeal exudate. Conjunctivae are normal. Pupils are equal round and reactive to light. No scleral icterus. Neck: Neck supple. Trachea midline. Cardiovascular: Normal rate, regular rhythm and intact distal pulses. No M/R/G Pulmonary/chest: Effort normal and breath sounds normal. No wheezing, rales or rhonchi. Abdominal: Soft, epigastric tenderness to palpation without rebound or guarding, obese, nondistended. Bowel sounds active throughout. Reducible umbilical hernia Extremities: no clubbing, cyanosis, or  edema Lymphadenopathy: No cervical adenopathy noted. Neurological: Alert and oriented to person place and time. Skin: Skin is warm and dry. No rashes noted. Psychiatric: Normal mood and affect. Behavior is normal.  Labs and Imaging -- CBC    Component Value Date/Time   WBC 4.5 06/01/2012 1127   RBC 5.42* 06/01/2012 1127   HGB 13.4 06/01/2012 1127   HCT 42.6 06/01/2012 1127   PLT 240 06/01/2012 1127   MCV 78.6 06/01/2012 1127   MCH 24.7* 06/01/2012 1127   MCHC 31.5 06/01/2012 1127   RDW 18.5* 06/01/2012 1127   LYMPHSABS 1.3 06/01/2012 1127    MONOABS 0.5 06/01/2012 1127   EOSABS 0.2 06/01/2012 1127   BASOSABS 0.0 06/01/2012 1127    CMP     Component Value Date/Time   NA 138 06/01/2012 1127   K 3.8 06/01/2012 1127   CL 102 06/01/2012 1127   CO2 26 06/01/2012 1127   GLUCOSE 132* 06/01/2012 1127   BUN 15 06/01/2012 1127   CREATININE 0.59 06/01/2012 1127   CALCIUM 9.7 06/01/2012 1127   PROT 7.5 06/01/2012 1127   ALBUMIN 3.6 06/01/2012 1127   AST 19 06/01/2012 1127   ALT 22 06/01/2012 1127   ALKPHOS 57 06/01/2012 1127   BILITOT 0.2* 06/01/2012 1127   GFRNONAA >90 06/01/2012 1127   GFRAA >90 06/01/2012 1127    Lipase     Component Value Date/Time   LIPASE 54 06/01/2012 1127   Imaging 06/01/2012 ABDOMINAL ULTRASOUND COMPLETE   Comparison:  CT abdomen pelvis 11/04/2011   Findings:   Gallbladder:  No gallstones, gallbladder wall thickening, or pericholecystic fluid. Sonographic Murphy's sign is negative.   Common Bile Duct:  Within normal limits in caliber. Measures 5 mm.   Liver: No focal mass lesion identified. Diffusely coarsened and increased echotexture, with some decreased penetration of the ultrasound beam suggest fatty infiltration of the liver. Negative for intrahepatic biliary duct dilatation.   IVC:  Appears normal.   Pancreas:  Although the pancreas is difficult to visualize in its entirety, no focal pancreatic abnormality is identified.   Spleen:  Within normal limits in size.  Measures 10.5 cm in sagittal length.  A 3.6 x 3.9 x 4.8 cm area of increased echogenicity within the superior aspect of the spleen corresponds to a low density structure seen on prior CT.  This likely reflects a benign hemangioma.   Right kidney:  Normal in size and parenchymal echogenicity.  No evidence of mass or hydronephrosis.   Left kidney:  Normal in size and parenchymal echogenicity.  No evidence of mass or hydronephrosis.   Abdominal Aorta:  No aneurysm identified.   IMPRESSION:   1.  No acute findings.  Negative for cholelithiasis or  biliary ductal dilatation. 2.  Fatty infiltration of the liver. 3.  Probable splenic hemangioma, appears stable compared to prior abdominal CT. CT ABDOMEN AND PELVIS WITH CONTRAST   Technique:  Multidetector CT imaging of the abdomen and pelvis was performed following the standard protocol during bolus administration of intravenous contrast.   Contrast: OMNIPAQUE IOHEXOL 300 MG/ML  SOLN   Comparison: CT abdomen pelvis 11/04/2011 and 12/01/2008 and abdominal ultrasound 06/01/2012.   Findings: The lung bases are clear.  Diffuse fatty infiltration of the liver.  Stable 4.5 cm hypodense lesion in the superior aspect of the spleen, unchanged dating back to CT of 2010, and most likely a benign splenic hemangioma.  The spleen is normal in size.   Normal gallbladder, adrenal glands, pancreas, and common  bile duct. Portal vein is patent.   Abdominal aorta is normal in caliber and enhancement.   A small supra-umbilical hernia contains fat and mesenteric vessels only and is stable.  Hernia sac measures 4.9 x 3.1 x 4.1 cm.   Stomach is decompressed.  Small bowel loops are normal in caliber. There is fecal material the terminal ileum.  No evidence of ileal wall thickening.  Multiple scattered colonic diverticula.  No associated inflammatory changes. Normal appendix (images 54-59). Normal rectum.  Lobular contour of the uterus suggests uterine fibroids.  No adnexal mass.   Negative for ascites or lymphadenopathy.   The vertebral bodies are normal in height alignment.  There are some posterior calcified disc osteophyte complexes at L2-3, L3-4, and L4-5.  No acute or suspicious bony abnormality.   IMPRESSION:   1.  No acute findings in the abdomen or pelvis. 2.  Fatty infiltration of the liver. 3.  Stable splenic lesion, likely a hemangioma, unchanged dating back to 2010. 4.  Normal appendix. 5.  Suspect fibroid uterus. 6. Fecalization of the terminal ileum.  This can be seen  in patients with history of chronic constipation or chronic low grade bowel obstruction.  The patient's bowel loops are normal in caliber.   7.  Supraumbilical hernia contains fat and mesenteric vessels only, stable.    ASSESSMENT AND PLAN: 51 year old female with a past medical history of hypertension, uterine fibroids, fatty liver, pancreatitis and GERD who is seen for evaluation of epigastric abdominal pain and chronic constipation.  1.  Epigastric abdominal pain/GERD -- the patient's symptoms are consistent with ulcer-like dyspepsia. She does take NSAIDs on a daily basis and this is one of her risk factors. I recommended EGD and we discussed the test and she is agreeable to proceed.  I will prescribe Nexium 40 mg twice daily until the EGD for empiric ulcer treatment. I've advised that she try to avoid NSAIDs until we have a better idea of what is going on.  2.  Chronic constipation -- long-standing issue for her. I would like to check her thyroid as it has not been done lately.  I have prescribe LINZESS 145 mcg daily. See #3  3.  CRC screening -- she is average risk from a colorectal cancer standpoint, and at age 58 is due for this test. We discussed the test including the risks and benefits and she is agreeable to proceed. This be performed at the same time as her EGD

## 2012-07-02 NOTE — Patient Instructions (Addendum)
You have been scheduled for a colonoscopy with propofol. Please follow written instructions given to you at your visit today.  Please pick up your prep kit at the pharmacy within the next 1-3 days. If you use inhalers (even only as needed), please bring them with you on the day of your procedure.  Your physician has requested that you go to the basement for lab work before leaving today.  We have sent the following medications to your pharmacy for you to pick up at your convenience: Moviprep; you were given instructions today at your visit.  Linzess; please take as directed, Nexium

## 2012-07-18 ENCOUNTER — Ambulatory Visit (AMBULATORY_SURGERY_CENTER): Payer: BC Managed Care – PPO | Admitting: Internal Medicine

## 2012-07-18 ENCOUNTER — Encounter: Payer: Self-pay | Admitting: Internal Medicine

## 2012-07-18 VITALS — BP 143/83 | HR 83 | Temp 97.6°F | Resp 14 | Ht 67.0 in | Wt 287.0 lb

## 2012-07-18 DIAGNOSIS — R1013 Epigastric pain: Secondary | ICD-10-CM

## 2012-07-18 DIAGNOSIS — K2970 Gastritis, unspecified, without bleeding: Secondary | ICD-10-CM

## 2012-07-18 DIAGNOSIS — D126 Benign neoplasm of colon, unspecified: Secondary | ICD-10-CM

## 2012-07-18 DIAGNOSIS — Z1211 Encounter for screening for malignant neoplasm of colon: Secondary | ICD-10-CM

## 2012-07-18 DIAGNOSIS — K2960 Other gastritis without bleeding: Secondary | ICD-10-CM

## 2012-07-18 DIAGNOSIS — K635 Polyp of colon: Secondary | ICD-10-CM

## 2012-07-18 DIAGNOSIS — K253 Acute gastric ulcer without hemorrhage or perforation: Secondary | ICD-10-CM

## 2012-07-18 MED ORDER — SODIUM CHLORIDE 0.9 % IV SOLN: 500.0000 mL | INTRAVENOUS | Status: DC

## 2012-07-18 MED ORDER — PANTOPRAZOLE SODIUM 40 MG PO TBEC: 40.0000 mg | DELAYED_RELEASE_TABLET | Freq: Two times a day (BID) | ORAL | Status: DC

## 2012-07-18 NOTE — Progress Notes (Signed)
Patient did not experience any of the following events: a burn prior to discharge; a fall within the facility; wrong site/side/patient/procedure/implant event; or a hospital transfer or hospital admission upon discharge from the facility. (G8907) Patient did not have preoperative order for IV antibiotic SSI prophylaxis. (G8918)  

## 2012-07-18 NOTE — Op Note (Signed)
[facility] [address] [place], [address]   COLONOSCOPY PROCEDURE REPORT  PATIENT: Sharon Swanson, Sharon Swanson  MR#: [id number] BIRTHDATE: 1960-11-24 , 51  yrs. old GENDER: Female ENDOSCOPIST: Beverley Fiedler, MD REFERRED BY: PROCEDURE DATE:  07/18/2012 PROCEDURE:   Colonoscopy with snare polypectomy and Colonoscopy with cold biopsy polypectomy ASA CLASS:   Class II INDICATIONS:average risk screening, constipation, and first colonoscopy. MEDICATIONS: MAC sedation, administered by CRNA and propofol (Diprivan) 250mg  IV  DESCRIPTION OF PROCEDURE:   After the risks benefits and alternatives of the procedure were thoroughly explained, informed consent was obtained.  A digital rectal exam revealed no abnormalities of the rectum.   The LB [id number]  endoscope was introduced through the anus and advanced to the cecum, which was identified by both the appendix and ileocecal valve. No adverse events experienced.   The quality of the prep was good, using MoviPrep  The instrument was then slowly withdrawn as the colon was fully examined.   COLON FINDINGS: There was moderate diverticulosis noted in the ascending colon, descending colon, and sigmoid colon with associated muscular hypertrophy.   Six sessile polyps ranging between 3-73mm in size were found in the sigmoid colon (2) and rectum (4).  Polypectomy was performed with cold forceps (1 on retroflexion) and using cold snare (5).  All resections were complete and all polyp tissue was completely retrieved.   Small internal hemorrhoids were found.  Retroflexed views revealed internal hemorrhoids and skin tag. The time to cecum=0 minutes 53 seconds.  Withdrawal time=17 minutes 13 seconds.  The scope was withdrawn and the procedure completed.  COMPLICATIONS: There were no complications.  ENDOSCOPIC IMPRESSION: 1.   There was moderate diverticulosis noted in the ascending colon, descending colon, and sigmoid colon 2.    Six sessile polyps ranging between 3-23mm in size were found in the sigmoid colon and rectum; Polypectomy was performed with cold forceps and using cold snare 3.   Small internal hemorrhoids  RECOMMENDATIONS: 1.  Hold aspirin, aspirin products, and anti-inflammatory medication for 1 week. 2.  Await pathology results 3.  High fiber diet 4.  Timing of repeat colonoscopy will be determined by pathology findings. 5.  You will receive a letter within 1-2 weeks with the results of your biopsy as well as final recommendations.  Please call my office if you have not received a letter after 3 weeks. 6.  Continue Linzess 145 mcg daily for chronic constipation eSigned:  Beverley Fiedler, MD 07/18/2012 3:39 PM

## 2012-07-18 NOTE — Op Note (Signed)
 [facility] [address] [place], [address]   ENDOSCOPY PROCEDURE REPORT  PATIENT: Sharon Swanson, Sharon Swanson  MR#: [id number] BIRTHDATE: 1961-05-30 , 51  yrs. old GENDER: Female ENDOSCOPIST: Beverley Fiedler, MD REFERRED BY: PROCEDURE DATE:  07/18/2012 PROCEDURE:  EGD w/ biopsy and EGD w/ biopsy for H.pylori ASA CLASS:     Class II INDICATIONS:  epigastric pain.   history of GERD. MEDICATIONS: MAC sedation, administered by CRNA and propofol (Diprivan) 250mg  IV TOPICAL ANESTHETIC: Cetacaine Spray  DESCRIPTION OF PROCEDURE: After the risks benefits and alternatives of the procedure were thoroughly explained, informed consent was obtained.  The LB GIF-H180 [id number] endoscope was introduced through the mouth and advanced to the second portion of the duodenum. Without limitations.  The instrument was slowly withdrawn as the mucosa was fully examined.     ESOPHAGUS: The mucosa of the esophagus appeared normal.  STOMACH: A single non-bleeding round and clean-based ulcer, measuring 8 x 5mm in size, was found in the pyloric channel. Biopsies were taken around the ulcer.   Moderate gastritis (inflammation) was found in the gastric body and gastric antrum. Multiple biopsies were performed using cold forceps.  Sample sent for histology.  DUODENUM: Moderate duodenal inflammation was found in the duodenal bulb.   The duodenal mucosa showed no abnormalities in the 2nd part of the duodenum.  Retroflexed views revealed no abnormalities.     The scope was then withdrawn from the patient and the procedure completed.  COMPLICATIONS: There were no complications.  ENDOSCOPIC IMPRESSION: 1.   The mucosa of the esophagus appeared normal 2.   Single non-bleeding ulcer, measuring 8 x 5mm in size, was found at the pylorus 3.   Gastritis (inflammation) was found in the gastric body and gastric antrum; multiple biopsies 4.   Duodenal inflammation was found in the duodenal bulb 5.   The  duodenal mucosa showed no abnormalities in the 2nd part of the duodenum  RECOMMENDATIONS: 1.  Await pathology results 2.  Continue Nexium 40 mg twice daily for at least 8 weeks 3.  Avoid NSAIDS 4.  Follow-up of helicobacter pylori status, treat if indicated 5.  Clinic visit in 6 weeks  eSigned:  Beverley Fiedler, MD 07/18/2012 3:30 PM   CC:The Patient  PATIENT NAME:  Sharon Swanson, Sharon Swanson MR#: [id number]

## 2012-07-18 NOTE — Patient Instructions (Addendum)
Avoid NSAIDS,(Motrin, Ibuprofen,Aleve,Naprasyn etc).  Call Dr. Lauro Franklin office for a followup appointment in 6 weeks.  High Fiber Diet.  Continue Linzess 145 mcg daily for constipation.    YOU HAD AN ENDOSCOPIC PROCEDURE TODAY AT THE [facility]: Refer to the procedure report that was given to you for any specific questions about what was found during the examination.  If the procedure report does not answer your questions, please call your gastroenterologist to clarify.  If you requested that your care partner not be given the details of your procedure findings, then the procedure report has been included in a sealed envelope for you to review at your convenience later.  YOU SHOULD EXPECT: Some feelings of bloating in the abdomen. Passage of more gas than usual.  Walking can help get rid of the air that was put into your GI tract during the procedure and reduce the bloating. If you had a lower endoscopy (such as a colonoscopy or flexible sigmoidoscopy) you may notice spotting of blood in your stool or on the toilet paper. If you underwent a bowel prep for your procedure, then you may not have a normal bowel movement for a few days.  DIET: Your first meal following the procedure should be a light meal and then it is ok to progress to your normal diet.  A half-sandwich or bowl of soup is an example of a good first meal.  Heavy or fried foods are harder to digest and may make you feel nauseous or bloated.  Likewise meals heavy in dairy and vegetables can cause extra gas to form and this can also increase the bloating.  Drink plenty of fluids but you should avoid alcoholic beverages for 24 hours.  ACTIVITY: Your care partner should take you home directly after the procedure.  You should plan to take it easy, moving slowly for the rest of the day.  You can resume normal activity the day after the procedure however you should NOT DRIVE or use heavy machinery for 24 hours (because of the  sedation medicines used during the test).    SYMPTOMS TO REPORT IMMEDIATELY: A gastroenterologist can be reached at any hour.  During normal business hours, 8:30 AM to 5:00 PM Monday through Friday, call [phone].  After hours and on weekends, please call the GI answering service at [phone] who will take a message and have the physician on call contact you.   Following lower endoscopy (colonoscopy or flexible sigmoidoscopy):  Excessive amounts of blood in the stool  Significant tenderness or worsening of abdominal pains  Swelling of the abdomen that is new, acute  Fever of 100F or higher  Following upper endoscopy (EGD)  Vomiting of blood or coffee ground material  New chest pain or pain under the shoulder blades  Painful or persistently difficult swallowing  New shortness of breath  Fever of 100F or higher  Black, tarry-looking stools  FOLLOW UP: If any biopsies were taken you will be contacted by phone or by letter within the next 1-3 weeks.  Call your gastroenterologist if you have not heard about the biopsies in 3 weeks.  Our staff will call the home number listed on your records the next business day following your procedure to check on you and address any questions or concerns that you may have at that time regarding the information given to you following your procedure. This is a courtesy call and so if there is no answer at the home number  and we have not heard from you through the emergency physician on call, we will assume that you have returned to your regular daily activities without incident.  SIGNATURES/CONFIDENTIALITY: You and/or your care partner have signed paperwork which will be entered into your electronic medical record.  These signatures attest to the fact that that the information above on your After Visit Summary has been reviewed and is understood.  Full responsibility of the confidentiality of this discharge information lies with you and/or your  care-partner.

## 2012-07-19 ENCOUNTER — Telehealth: Payer: Self-pay

## 2012-07-19 NOTE — Telephone Encounter (Signed)
  Follow up Call-  Call back number 07/18/2012  Post procedure Call Back phone  # [phone]  Permission to leave phone message Yes     Patient questions:  Do you have a fever, pain , or abdominal swelling? no Pain Score  0 *  Have you tolerated food without any problems? yes  Have you been able to return to your normal activities? yes  Do you have any questions about your discharge instructions: Diet   no Medications  no Follow up visit  no  Do you have questions or concerns about your Care? no  Actions: * If pain score is 4 or above: No action needed, pain <4.

## 2012-07-24 ENCOUNTER — Encounter: Payer: Self-pay | Admitting: Internal Medicine

## 2012-07-25 ENCOUNTER — Telehealth: Payer: Self-pay | Admitting: *Deleted

## 2012-07-25 NOTE — Telephone Encounter (Signed)
Neither phone number was of help; the cell is disconnected and the work number is a main number. Mailed pt a letter to contact us.

## 2012-07-25 NOTE — Telephone Encounter (Signed)
Message copied by Florene Glen on Thu Jul 25, 2012  9:55 AM ------      Message from: Beverley Fiedler      Created: Wed Jul 24, 2012  2:48 PM       H. pylori positive by biopsy      Please treat with Pylera in addition to the twice a day PPI she is already on      She should have clinic followup in about 6 weeks      Plan H. pylori stool antigen off PPI in 3 months to confirm eradication

## 2012-07-29 MED ORDER — BIS SUBCIT-METRONID-TETRACYC 140-125-125 MG PO CAPS: 3.0000 | ORAL_CAPSULE | Freq: Three times a day (TID) | ORAL | Status: DC

## 2012-07-29 NOTE — Telephone Encounter (Signed)
Pt called and states she does not know why he phone did not work; she received my letter. Informed pt of +HP and the need for an AB. She will call if the price is too high. Pt reports her stomach is still burning and it radiates through to her back. She is taking pantoprazole BID; she does report good results from Linzess. Any change in therapy? Thanks.

## 2012-07-29 NOTE — Telephone Encounter (Signed)
No change in therapy, just the H pylori treatment, which hopefully will improve her dyspeptic symptoms. She can followup if necessary in the office Stool antigen 3 months off PPI

## 2012-08-08 ENCOUNTER — Telehealth: Payer: Self-pay | Admitting: Internal Medicine

## 2012-08-08 DIAGNOSIS — R300 Dysuria: Secondary | ICD-10-CM

## 2012-08-09 ENCOUNTER — Telehealth: Payer: Self-pay | Admitting: Internal Medicine

## 2012-08-09 MED ORDER — NYSTATIN 100000 UNIT/ML MT SUSP: OROMUCOSAL | Status: DC

## 2012-08-09 NOTE — Telephone Encounter (Signed)
Spoke with pt who reports her mouth is coated white inside and she does have a foul taste. She reports she drinks water all the time; nothing else. She states she burns after urinating and itches after wiping.  Dr Rhea Belton, you ordered Nystatin s&s and UA, C&S in earlier note; still the same? Please advise. Thanks.

## 2012-08-09 NOTE — Telephone Encounter (Signed)
Informed pt the orders are the same; she needs Nystatin for oral thrush and a UA, C&S. Pt states he can't come in today, she will come for the urine specimen on Monday.

## 2012-08-09 NOTE — Telephone Encounter (Signed)
lmom for pt to call back. 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 Ok to order something for thrush and order a UA prior to speaking with the pt? Thanks.

## 2012-08-09 NOTE — Telephone Encounter (Signed)
Waited to close the encounter because I wasn't sure pt could afford the pylera. Reminder in to order stool antigen after she is off the ppi x 1 week.

## 2012-08-09 NOTE — Telephone Encounter (Signed)
Per Dr Rhea Belton, OK to order U/A and C&S. The thrush she reports in her mouth could be thrush/yeast, although there is a common reaction with flagyl and tetracycline that causes a coating in the oral area and a foul taste. We can order Nystatin swish and spit and it would not hurt if not thrush.

## 2012-08-09 NOTE — Telephone Encounter (Signed)
See earlier encounter

## 2012-08-09 NOTE — Telephone Encounter (Signed)
Yes

## 2012-08-12 ENCOUNTER — Other Ambulatory Visit (INDEPENDENT_AMBULATORY_CARE_PROVIDER_SITE_OTHER): Payer: BC Managed Care – PPO

## 2012-08-12 DIAGNOSIS — R300 Dysuria: Secondary | ICD-10-CM

## 2012-08-12 LAB — URINALYSIS, ROUTINE W REFLEX MICROSCOPIC
Nitrite: NEGATIVE
Specific Gravity, Urine: >=1.03 (ref 1.000–1.030)
Urobilinogen, UA: 0.2 (ref 0.0–1.0)
pH: 5.5 (ref 5.0–8.0)

## 2012-08-14 ENCOUNTER — Encounter: Payer: Self-pay | Admitting: Internal Medicine

## 2012-08-15 ENCOUNTER — Ambulatory Visit (INDEPENDENT_AMBULATORY_CARE_PROVIDER_SITE_OTHER): Payer: BC Managed Care – PPO | Admitting: Internal Medicine

## 2012-08-15 ENCOUNTER — Encounter: Payer: Self-pay | Admitting: Internal Medicine

## 2012-08-15 VITALS — BP 138/90 | HR 84 | Ht 67.0 in | Wt 284.1 lb

## 2012-08-15 DIAGNOSIS — K5909 Other constipation: Secondary | ICD-10-CM

## 2012-08-15 DIAGNOSIS — K253 Acute gastric ulcer without hemorrhage or perforation: Secondary | ICD-10-CM

## 2012-08-15 DIAGNOSIS — K5900 Constipation, unspecified: Secondary | ICD-10-CM

## 2012-08-15 DIAGNOSIS — A048 Other specified bacterial intestinal infections: Secondary | ICD-10-CM

## 2012-08-15 MED ORDER — SUCRALFATE 1 GM/10ML PO SUSP: 1.0000 g | Freq: Four times a day (QID) | ORAL | Status: DC

## 2012-08-15 MED ORDER — PANTOPRAZOLE SODIUM 40 MG PO TBEC: 40.0000 mg | DELAYED_RELEASE_TABLET | Freq: Two times a day (BID) | ORAL | Status: DC

## 2012-08-15 NOTE — Progress Notes (Signed)
  Subjective:    Patient ID: Sharon Swanson, female    DOB: [birth year], 51 y.o.   MRN: [id number]  HPI Mrs. Sharon Swanson is a 51 yo female with PMH of hypertension, uterine fibroids, fatty liver, pancreatitis and GERD who is seen for followup of peptic ulcer disease. The patient underwent upper endoscopy and colonoscopy performed on 07/18/2012. Upper endoscopy revealed antral ulcer with gastritis and duodenitis. Biopsies showed inflammation H. pylori infection.  Colonoscopy revealed 6 small polyps in the rectum and sigmoid removed by cold snare and small internal hemorrhoids. There was moderate diverticulosis. The pathology revealed hyperplastic polyps only. She was treated for H. pylori and completed Pylera.  She was also taking pantoprazole 40 mg twice daily, but because she is about to run out she is decrease this to once daily. She is still having postprandial epigastric abdominal pain which is worse after a large meal. She is less nausea no vomiting of late. Overall her epigastric pain is better but not resolved. She denies rectal bleeding or melena. She reports significant improvement of her constipation with the use of LINZESS 145 mcg daily. She is working on a daily basis and reports her pain has not kept her from working   Review of Systems As per history of present illness, otherwise negative  Current Medications, Allergies, Past Medical History, Past Surgical History, Family History and Social History were reviewed in [employer] record.     Objective:   Physical Exam BP 138/90  Pulse 84  Ht 5\' 7"  (1.702 m)  Wt 284 lb 2 oz (128.878 kg)  BMI 44.50 kg/m2 Constitutional: Well-developed and well-nourished. No distress.  HEENT: Normocephalic and atraumatic. Oropharynx is clear and moist. No oropharyngeal exudate. Conjunctivae are normal. No scleral icterus.  Cardiovascular: Normal rate, regular rhythm and intact distal pulses. No M/R/G  Pulmonary/chest: Effort normal  and breath sounds normal. No wheezing, rales or rhonchi.  Abdominal: Soft, epigastric tenderness to palpation without rebound or guarding, obese, nondistended. Bowel sounds active throughout. Reducible umbilical hernia  Extremities: no clubbing, cyanosis, or edema  Neurological: Alert and oriented to person place and time.  Psychiatric: Normal mood and affect. Behavior is normal.     Assessment & Plan:   51 yo female with PMH of hypertension, uterine fibroids, fatty liver, pancreatitis and GERD who is seen for followup of peptic ulcer disease.  1.  H. pylori gastric ulcer disease/duodenitis -- she successfully completed H. pylori therapy and is still having some symptoms of PUD, though overall there has been improvement. I recommend she continue pantoprazole 40 mg twice daily for at least 8 more weeks. I will l add sucralfate 1 g 4 times a day when necessary for epigastric pain. We have discussed H. pylori stool antigen to confirm eradication in 8-12 weeks off of PPI therapy.  The gastric ulcer was biopsied and revealed no dysplasia.  If her symptoms fail to resolve after 8-12 weeks, then repeat EGD will be recommended.  2.  Chronic constipation -- she has had a favorable response to [employer]. She will continue 145 mcg daily. She was given additional samples today.  3.  CRC screening -- hyperplastic polyps only, repeat colonoscopy for colorectal cancer recommended in October of 2023

## 2012-08-15 NOTE — Patient Instructions (Addendum)
You have been given a separate informational sheet regarding your tobacco use, the importance of quitting and local resources to help you quit.  We have sent the following medications to your pharmacy for you to pick up at your convenience: Continue taking Protonix twice daily for the next 8 weeks.  Carafate; take as directed  Your physician has requested that you go to the basement for the following lab work in mid-February:H-pylori   Please follow up with Dr. Rhea Belton in 12 weeks.

## 2012-11-22 ENCOUNTER — Telehealth: Payer: Self-pay | Admitting: *Deleted

## 2012-11-22 NOTE — Telephone Encounter (Signed)
Message copied by Florene Glen on Fri Nov 22, 2012 10:50 AM ------      Message from: Florene Glen      Created: Fri Aug 09, 2012  8:40 AM              No change in therapy, just the H pylori treatment, which hopefully will improve her dyspeptic symptoms.      She can followup if necessary in the office      Stool antigen 3 months off PPI             Pt is taking pylera now, 08/09/12, call in late February to stop the ppi x 1 week and do the stool antigen                                 ------

## 2012-11-22 NOTE — Telephone Encounter (Signed)
lmom for pt to call back

## 2012-12-09 NOTE — Telephone Encounter (Signed)
Never heard from pt; mailed her a letter to contact us.

## 2012-12-15 ENCOUNTER — Encounter (HOSPITAL_COMMUNITY): Payer: Self-pay | Admitting: *Deleted

## 2012-12-15 ENCOUNTER — Emergency Department (HOSPITAL_COMMUNITY)
Admission: EM | Admit: 2012-12-15 | Discharge: 2012-12-16 | Disposition: A | Discharge status: Home / Self Care | Payer: BC Managed Care – PPO | Attending: Emergency Medicine | Admitting: Emergency Medicine

## 2012-12-15 ENCOUNTER — Emergency Department (HOSPITAL_COMMUNITY): Payer: BC Managed Care – PPO

## 2012-12-15 DIAGNOSIS — E669 Obesity, unspecified: Secondary | ICD-10-CM | POA: Insufficient documentation

## 2012-12-15 DIAGNOSIS — Z8719 Personal history of other diseases of the digestive system: Secondary | ICD-10-CM | POA: Insufficient documentation

## 2012-12-15 DIAGNOSIS — J029 Acute pharyngitis, unspecified: Secondary | ICD-10-CM | POA: Insufficient documentation

## 2012-12-15 DIAGNOSIS — Z79899 Other long term (current) drug therapy: Secondary | ICD-10-CM | POA: Insufficient documentation

## 2012-12-15 DIAGNOSIS — Z8742 Personal history of other diseases of the female genital tract: Secondary | ICD-10-CM | POA: Insufficient documentation

## 2012-12-15 DIAGNOSIS — Z87898 Personal history of other specified conditions: Secondary | ICD-10-CM | POA: Insufficient documentation

## 2012-12-15 DIAGNOSIS — J40 Bronchitis, not specified as acute or chronic: Secondary | ICD-10-CM | POA: Insufficient documentation

## 2012-12-15 DIAGNOSIS — F17200 Nicotine dependence, unspecified, uncomplicated: Secondary | ICD-10-CM | POA: Insufficient documentation

## 2012-12-15 DIAGNOSIS — I10 Essential (primary) hypertension: Secondary | ICD-10-CM | POA: Insufficient documentation

## 2012-12-15 DIAGNOSIS — R490 Dysphonia: Secondary | ICD-10-CM | POA: Insufficient documentation

## 2012-12-15 DIAGNOSIS — K219 Gastro-esophageal reflux disease without esophagitis: Secondary | ICD-10-CM | POA: Insufficient documentation

## 2012-12-15 DIAGNOSIS — K429 Umbilical hernia without obstruction or gangrene: Secondary | ICD-10-CM

## 2012-12-15 MED ORDER — AZITHROMYCIN 250 MG PO TABS
500.0000 mg | ORAL_TABLET | Freq: Once | ORAL | Status: AC
  Administered 2012-12-16: 500 mg via ORAL
  Filled 2012-12-15: qty 2

## 2012-12-15 MED ORDER — AZITHROMYCIN 250 MG PO TABS: 250.0000 mg | ORAL_TABLET | Freq: Every day | ORAL | Status: DC

## 2012-12-15 MED ORDER — GUAIFENESIN-CODEINE 100-10 MG/5ML PO SYRP: 5.0000 mL | ORAL_SOLUTION | Freq: Three times a day (TID) | ORAL | Status: DC | PRN

## 2012-12-15 MED ORDER — GUAIFENESIN-CODEINE 100-10 MG/5ML PO SOLN
10.0000 mL | Freq: Once | ORAL | Status: AC
  Administered 2012-12-16: 10 mL via ORAL
  Filled 2012-12-15: qty 10

## 2012-12-15 MED ORDER — ALBUTEROL SULFATE HFA 108 (90 BASE) MCG/ACT IN AERS
2.0000 | INHALATION_SPRAY | RESPIRATORY_TRACT | Status: DC | PRN
  Administered 2012-12-16: 2 via RESPIRATORY_TRACT
  Filled 2012-12-15: qty 6.7

## 2012-12-15 NOTE — ED Notes (Signed)
Pt c/o hoarseness; cough x 1 wk; states naval normally is inward and now feels outward

## 2012-12-15 NOTE — ED Provider Notes (Signed)
History    This chart was scribed for non-physician practitioner working with April Smitty Cords, MD by Donne Anon, ED Scribe. This patient was seen in room WTR5/WTR5 and the patient's care was started at 2313.   CSN: [id number]  Arrival date & time 12/15/12  2223   First MD Initiated Contact with Patient 12/15/12 2313      Chief Complaint  Patient presents with  . URI     The history is provided by the patient. No language interpreter was used.   Sharon Swanson is a 52 y.o. female who presents to the Emergency Department complaining of gradual onset, intermittent, gradually worsening, moderate cough which began 1 week ago. She reports associated sore throat. Pt denies fever or any other pain. Pt states that her naval is normally inward and is sticking outward now. She states it sticks out more when she coughs.   Past Medical History  Diagnosis Date  . Acid reflux   . Hypertension   . Fibroids 11/08/2011  . Obesity (BMI 30-39.9) 11/08/2011  . Fatty liver 11/08/2011  . Pancreatitis 11/08/2011  . Ventral hernia     Past Surgical History  Procedure Laterality Date  . Orthopedic surgery      R ankle, tendonitis    Family History  Problem Relation Age of Onset  . Hypertension Mother     History  Substance Use Topics  . Smoking status: Current Every Day Smoker -- 0.30 packs/day    Types: Cigarettes  . Smokeless tobacco: Never Used  . Alcohol Use: No    Review of Systems  HENT: Positive for sore throat.   Respiratory: Positive for cough.   All other systems reviewed and are negative.    Allergies  Aspirin  Home Medications   Current Outpatient Rx  Name  Route  Sig  Dispense  Refill  . cloNIDine (CATAPRES) 0.1 MG tablet   Oral   Take 0.1 mg by mouth 2 (two) times daily.          Marland Kitchen docusate sodium (COLACE) 100 MG capsule   Oral   Take 100 mg by mouth every 12 (twelve) hours.         . Linaclotide (LINZESS) 145 MCG CAPS   Oral   Take 1 capsule (145  mcg total) by mouth daily.   30 capsule   6   . pantoprazole (PROTONIX) 40 MG tablet   Oral   Take 1 tablet (40 mg total) by mouth 2 (two) times daily.   60 tablet   3   . azithromycin (ZITHROMAX) 250 MG tablet   Oral   Take 1 tablet (250 mg total) by mouth daily. Take first 2 tablets together, then 1 every day until finished.   5 tablet   0     1 tab q day starting on 12/17/2012   . guaiFENesin-codeine (ROBITUSSIN AC) 100-10 MG/5ML syrup   Oral   Take 5 mLs by mouth 3 (three) times daily as needed for cough.   120 mL   0     BP 155/94  Pulse 91  Temp(Src) 98.7 F (37.1 C) (Oral)  Resp 24  SpO2 97%  Physical Exam  Nursing note and vitals reviewed. Constitutional: She is oriented to person, place, and time. She appears well-developed and well-nourished. No distress.  Pt is morbidly obese.  HENT:  Head: Normocephalic and atraumatic.  Eyes: EOM are normal.  Neck: Neck supple. No tracheal deviation present.  Cardiovascular: Normal rate.   Pulmonary/Chest:  Effort normal. No respiratory distress. She has wheezes (mild). She has no rales. She exhibits no tenderness.  Abdominal:  Periumbilical hernia that is soft and reducible.   Musculoskeletal: Normal range of motion.  Neurological: She is alert and oriented to person, place, and time.  Skin: Skin is warm and dry.  Psychiatric: She has a normal mood and affect. Her behavior is normal.    ED Course  Procedures (including critical care time) DIAGNOSTIC STUDIES: Oxygen Saturation is 96% on room air, adequate by my interpretation.    COORDINATION OF CARE: 11:41 PM Discussed treatment plan which includes an inhaler, cough medication and antibiotics with pt at bedside and pt agreed to plan. Advised pt about importance of caring for hernia and making sure it gets pushed back in when it pops out.   Labs Reviewed - No data to display Dg Chest 2 View  12/15/2012  *RADIOLOGY REPORT*  Clinical Data: Cough and cold symptoms.   CHEST - 2 VIEW  Comparison: 06/01/2012.  Findings: The cardiac silhouette, mediastinal and hilar contours are within normal limits and stable.  Mild bronchitic type lung changes appears stable.  No infiltrates, edema or effusions.  The bony thorax is intact. Stable advanced degenerative changes in the thoracic spine.  IMPRESSION: Bronchitic type lung changes but no infiltrates.   Original Report Authenticated By: Rudie Meyer, M.D.      1. Bronchitis   2. Umbilical hernia       MDM  Pt has been advised of the symptoms that warrant their return to the ED. Patient has voiced understanding and has agreed to follow-up with the PCP or specialist.  I personally performed the services described in this documentation, which was scribed in my presence. The recorded information has been reviewed and is accurate.        Dorthula Matas, PA-C 12/16/12 [phone]

## 2012-12-16 NOTE — ED Provider Notes (Signed)
Medical screening examination/treatment/procedure(s) were performed by non-physician practitioner and as supervising physician I was immediately available for consultation/collaboration.  Miana Politte K Dawnya Grams-Rasch, MD 12/16/12 0333 

## 2013-09-24 ENCOUNTER — Emergency Department (HOSPITAL_COMMUNITY)
Admission: EM | Admit: 2013-09-24 | Discharge: 2013-09-24 | Disposition: A | Discharge status: Home / Self Care | Payer: BC Managed Care – PPO | Attending: Emergency Medicine | Admitting: Emergency Medicine

## 2013-09-24 ENCOUNTER — Encounter (HOSPITAL_COMMUNITY): Payer: Self-pay | Admitting: Emergency Medicine

## 2013-09-24 ENCOUNTER — Emergency Department (HOSPITAL_COMMUNITY): Payer: BC Managed Care – PPO

## 2013-09-24 DIAGNOSIS — J40 Bronchitis, not specified as acute or chronic: Secondary | ICD-10-CM

## 2013-09-24 DIAGNOSIS — J029 Acute pharyngitis, unspecified: Secondary | ICD-10-CM | POA: Insufficient documentation

## 2013-09-24 DIAGNOSIS — R0789 Other chest pain: Secondary | ICD-10-CM

## 2013-09-24 DIAGNOSIS — K219 Gastro-esophageal reflux disease without esophagitis: Secondary | ICD-10-CM | POA: Insufficient documentation

## 2013-09-24 DIAGNOSIS — R071 Chest pain on breathing: Secondary | ICD-10-CM | POA: Insufficient documentation

## 2013-09-24 DIAGNOSIS — Z79899 Other long term (current) drug therapy: Secondary | ICD-10-CM | POA: Insufficient documentation

## 2013-09-24 DIAGNOSIS — K429 Umbilical hernia without obstruction or gangrene: Secondary | ICD-10-CM | POA: Insufficient documentation

## 2013-09-24 DIAGNOSIS — F17200 Nicotine dependence, unspecified, uncomplicated: Secondary | ICD-10-CM | POA: Insufficient documentation

## 2013-09-24 DIAGNOSIS — J209 Acute bronchitis, unspecified: Secondary | ICD-10-CM | POA: Insufficient documentation

## 2013-09-24 DIAGNOSIS — Z8742 Personal history of other diseases of the female genital tract: Secondary | ICD-10-CM | POA: Insufficient documentation

## 2013-09-24 DIAGNOSIS — I10 Essential (primary) hypertension: Secondary | ICD-10-CM | POA: Insufficient documentation

## 2013-09-24 DIAGNOSIS — E669 Obesity, unspecified: Secondary | ICD-10-CM | POA: Insufficient documentation

## 2013-09-24 DIAGNOSIS — J329 Chronic sinusitis, unspecified: Secondary | ICD-10-CM | POA: Insufficient documentation

## 2013-09-24 DIAGNOSIS — R52 Pain, unspecified: Secondary | ICD-10-CM | POA: Insufficient documentation

## 2013-09-24 MED ORDER — HYDROCODONE-ACETAMINOPHEN 5-325 MG PO TABS
2.0000 | ORAL_TABLET | Freq: Once | ORAL | Status: AC
  Administered 2013-09-24: 2 via ORAL
  Filled 2013-09-24: qty 2

## 2013-09-24 MED ORDER — HYDROCODONE-ACETAMINOPHEN 5-325 MG PO TABS: 2.0000 | ORAL_TABLET | ORAL | Status: DC | PRN

## 2013-09-24 MED ORDER — BENZONATATE 100 MG PO CAPS: 100.0000 mg | ORAL_CAPSULE | Freq: Three times a day (TID) | ORAL | Status: DC

## 2013-09-24 MED ORDER — LEVOFLOXACIN 500 MG PO TABS: 500.0000 mg | ORAL_TABLET | Freq: Every day | ORAL | Status: DC

## 2013-09-24 NOTE — ED Notes (Signed)
Pt c/o cough, nasal congestion, fever, and sore throat x 2 weeks and L ribcage pain and SOB x "a couple days."  Pain score 9/10.  Pt sts OTC use w/o relief.

## 2013-09-24 NOTE — ED Provider Notes (Signed)
CSN: [id number]     Arrival date & time 09/24/13  0809 History   First MD Initiated Contact with Patient 09/24/13 [phone]     Chief Complaint  Patient presents with  . Cough  . Ribcage pain   . Nasal Congestion    HPI  Patient presents with about a two-week illness. Has had a cough for about 2 weeks. 340s ago started feeling some pain in her left chest with a cough. Nonproductive dry cough. No. Shakes and chills yesterday. No documented fever last night and afebrile here. Also no vomiting. No diffuse myalgias.says some nasal congestion sinus pressure sore throat and mild body aches. History of Tylenol at home and multiple over-the-counter cold medications without relief. History of hypertension. No documented history of heart disease or COPD. She is a daily smoker. Changes along couple of abdomen rounded area of a periumbilical hernia since she started coughing. Does still reduce.   Past Medical History  Diagnosis Date  . Acid reflux   . Hypertension   . Fibroids 11/08/2011  . Obesity (BMI 30-39.9) 11/08/2011  . Fatty liver 11/08/2011  . Pancreatitis 11/08/2011  . Ventral hernia    Past Surgical History  Procedure Laterality Date  . Orthopedic surgery      R ankle, tendonitis   Family History  Problem Relation Age of Onset  . Hypertension Mother    History  Substance Use Topics  . Smoking status: Current Every Day Smoker -- 0.30 packs/day    Types: Cigarettes  . Smokeless tobacco: Never Used  . Alcohol Use: No   OB History   Grav Para Term Preterm Abortions TAB SAB Ect Mult Living                 Review of Systems  Constitutional: Negative for fever, chills, diaphoresis, appetite change and fatigue.  HENT: Negative for mouth sores, sore throat and trouble swallowing.   Eyes: Negative for visual disturbance.  Respiratory: Positive for cough and shortness of breath. Negative for chest tightness and wheezing.   Cardiovascular: Positive for chest pain.  Gastrointestinal:  Negative for nausea, vomiting, abdominal pain, diarrhea and abdominal distention.       Tenderness around a reducible periumbilical hernia  Endocrine: Negative for polydipsia, polyphagia and polyuria.  Genitourinary: Negative for dysuria, frequency and hematuria.  Musculoskeletal: Negative for gait problem.  Skin: Negative for color change, pallor and rash.  Neurological: Negative for dizziness, syncope, light-headedness and headaches.  Hematological: Does not bruise/bleed easily.  Psychiatric/Behavioral: Negative for behavioral problems and confusion.    Allergies  Aspirin  Home Medications   Current Outpatient Rx  Name  Route  Sig  Dispense  Refill  . cloNIDine (CATAPRES) 0.1 MG tablet   Oral   Take 0.1 mg by mouth 2 (two) times daily.          . Linaclotide (LINZESS) 145 MCG CAPS   Oral   Take 1 capsule (145 mcg total) by mouth daily.   30 capsule   6   . omeprazole (PRILOSEC) 20 MG capsule   Oral   Take 20 mg by mouth daily.         . pantoprazole (PROTONIX) 40 MG tablet   Oral   Take 1 tablet (40 mg total) by mouth 2 (two) times daily.   60 tablet   3   . benzonatate (TESSALON) 100 MG capsule   Oral   Take 1 capsule (100 mg total) by mouth every 8 (eight) hours.   21  capsule   0   . HYDROcodone-acetaminophen (NORCO/VICODIN) 5-325 MG per tablet   Oral   Take 2 tablets by mouth every 4 (four) hours as needed.   10 tablet   0   . levofloxacin (LEVAQUIN) 500 MG tablet   Oral   Take 1 tablet (500 mg total) by mouth daily.   10 tablet   0    BP 164/86  Pulse 90  Temp(Src) 99.1 F (37.3 C) (Oral)  Resp 20  SpO2 94% Physical Exam  Constitutional: She is oriented to person, place, and time. She appears well-developed and well-nourished. No distress.  HENT:  Head: Normocephalic.  Eyes: Conjunctivae are normal. Pupils are equal, round, and reactive to light. No scleral icterus.  Neck: Normal range of motion. Neck supple. No thyromegaly present.   Cardiovascular: Normal rate and regular rhythm.  Exam reveals no gallop and no friction rub.   No murmur heard. Pulmonary/Chest: Effort normal and breath sounds normal. No respiratory distress. She has no wheezes. She has no rales.  Diminished breath sounds and splinting left posterior lateral chest. No prolongation. No crackles or rales.  Abdominal: Soft. Bowel sounds are normal. She exhibits no distension. There is no tenderness. There is no rebound.  Her umbilical hernia tender but reducible  Musculoskeletal: Normal range of motion.  Neurological: She is alert and oriented to person, place, and time.  Skin: Skin is warm and dry. No rash noted.  Psychiatric: She has a normal mood and affect. Her behavior is normal.    ED Course  Procedures (including critical care time) Labs Review Labs Reviewed - No data to display Imaging Review Dg Chest 2 View  09/24/2013   CLINICAL DATA:  Cough.  EXAM: CHEST  2 VIEW  COMPARISON:  12/15/2012.  01/13/2011.  FINDINGS: Mediastinum hilar structures stable. No focal infiltrate. Stable prior interstitial prominence . Borderline cardiomegaly. No evidence of overt congestive heart failure. No pleural effusion or pneumothorax.  IMPRESSION: 1. Chronic interstitial prominence suggestive matter interstitial fibrosis. No focal infiltrate .  2.  Stable cardiomegaly.   Electronically Signed   By: Maisie Fus  Register   On: 09/24/2013 09:19    EKG Interpretation   None       MDM   1. Bronchitis   2. Chest wall pain   3. Sinusitis     Has sinusitis symptoms. This may be simple viral. Plan will be x-ray considering diminished breath sounds, to rule out Pneumonia. Symptom control Vicodin for pain and cough.  Reevaluation: Patient states he is getting some cough and pain relief of medication. Plan Levaquin Vicodin Tessalon premature followup if not improved    Rolland Porter, MD 09/24/13 [phone]

## 2013-10-03 ENCOUNTER — Emergency Department (HOSPITAL_COMMUNITY)
Admission: EM | Admit: 2013-10-03 | Discharge: 2013-10-03 | Disposition: A | Discharge status: Home / Self Care | Payer: BC Managed Care – PPO | Attending: Emergency Medicine | Admitting: Emergency Medicine

## 2013-10-03 ENCOUNTER — Encounter (HOSPITAL_COMMUNITY): Payer: Self-pay | Admitting: Emergency Medicine

## 2013-10-03 ENCOUNTER — Other Ambulatory Visit: Payer: Self-pay

## 2013-10-03 DIAGNOSIS — Z79899 Other long term (current) drug therapy: Secondary | ICD-10-CM | POA: Insufficient documentation

## 2013-10-03 DIAGNOSIS — K219 Gastro-esophageal reflux disease without esophagitis: Secondary | ICD-10-CM | POA: Insufficient documentation

## 2013-10-03 DIAGNOSIS — M7989 Other specified soft tissue disorders: Secondary | ICD-10-CM | POA: Insufficient documentation

## 2013-10-03 DIAGNOSIS — F17200 Nicotine dependence, unspecified, uncomplicated: Secondary | ICD-10-CM | POA: Insufficient documentation

## 2013-10-03 DIAGNOSIS — R0789 Other chest pain: Secondary | ICD-10-CM

## 2013-10-03 DIAGNOSIS — E669 Obesity, unspecified: Secondary | ICD-10-CM | POA: Insufficient documentation

## 2013-10-03 DIAGNOSIS — Z792 Long term (current) use of antibiotics: Secondary | ICD-10-CM | POA: Insufficient documentation

## 2013-10-03 DIAGNOSIS — R071 Chest pain on breathing: Secondary | ICD-10-CM | POA: Insufficient documentation

## 2013-10-03 DIAGNOSIS — R112 Nausea with vomiting, unspecified: Secondary | ICD-10-CM | POA: Insufficient documentation

## 2013-10-03 DIAGNOSIS — I10 Essential (primary) hypertension: Secondary | ICD-10-CM | POA: Insufficient documentation

## 2013-10-03 DIAGNOSIS — Z8742 Personal history of other diseases of the female genital tract: Secondary | ICD-10-CM | POA: Insufficient documentation

## 2013-10-03 MED ORDER — HYDROMORPHONE HCL PF 2 MG/ML IJ SOLN
2.0000 mg | Freq: Once | INTRAMUSCULAR | Status: AC
  Administered 2013-10-03: 2 mg via INTRAMUSCULAR
  Filled 2013-10-03: qty 1

## 2013-10-03 MED ORDER — OXYCODONE-ACETAMINOPHEN 5-325 MG PO TABS: 1.0000 | ORAL_TABLET | Freq: Four times a day (QID) | ORAL | Status: DC | PRN

## 2013-10-03 MED ORDER — ONDANSETRON 8 MG PO TBDP
8.0000 mg | ORAL_TABLET | Freq: Once | ORAL | Status: AC
  Administered 2013-10-03: 8 mg via ORAL
  Filled 2013-10-03: qty 1

## 2013-10-03 MED ORDER — DEXAMETHASONE SODIUM PHOSPHATE 10 MG/ML IJ SOLN
10.0000 mg | Freq: Once | INTRAMUSCULAR | Status: AC
  Administered 2013-10-03: 10 mg via INTRAMUSCULAR
  Filled 2013-10-03: qty 1

## 2013-10-03 NOTE — ED Provider Notes (Signed)
ECG: Sinus rhythm, ventricular rate 94, left axis deviation, nonspecific T-wave abnormalities inferolateral leads, no significant change compared to previous ECG  Babette Relic, MD 10/03/13 2001

## 2013-10-03 NOTE — Discharge Instructions (Signed)
Please read and follow all provided instructions.  Your diagnoses today include:  1. Chest wall pain     Tests performed today include:  An EKG of your heart  Vital signs. See below for your results today.   Medications prescribed:   Percocet (oxycodone/acetaminophen) - narcotic pain medication  DO NOT drive or perform any activities that require you to be awake and alert because this medicine can make you drowsy. BE VERY CAREFUL not to take multiple medicines containing Tylenol (also called acetaminophen). Doing so can lead to an overdose which can damage your liver and cause liver failure and possibly death.  Take any prescribed medications only as directed.  Follow-up instructions: Please follow-up with your primary care provider as soon as you can for further evaluation of your symptoms. If you do not have a primary care doctor -- see below for referral information.   Return instructions:  SEEK IMMEDIATE MEDICAL ATTENTION IF:  You have severe chest pain, especially if the pain is crushing or pressure-like and spreads to the arms, back, neck, or jaw, or if you have sweating, nausea (feeling sick to your stomach), or shortness of breath. THIS IS AN EMERGENCY. Don't wait to see if the pain will go away. Get medical help at once. Call 911 or 0 (operator). DO NOT drive yourself to the hospital.   Your chest pain gets worse and does not go away with rest.   You have an attack of chest pain lasting longer than usual, despite rest and treatment with the medications your caregiver has prescribed.   You wake from sleep with chest pain or shortness of breath.  You feel dizzy or faint.  You have chest pain not typical of your usual pain for which you originally saw your caregiver.   You have any other emergent concerns regarding your health.  Additional Information: Chest pain comes from many different causes. Your caregiver has diagnosed you as having chest pain that is not specific  for one problem, but does not require admission.  You are at low risk for an acute heart condition or other serious illness.   Your vital signs today were: BP 153/76   Pulse 89   Temp(Src) 98.1 F (36.7 C) (Oral)   Resp 22   SpO2 94% If your blood pressure (BP) was elevated above 135/85 this visit, please have this repeated by your doctor within one month. --------------  Emergency Department Resource Guide 1) Find a Doctor and Pay Out of Pocket Although you won't have to find out who is covered by your insurance plan, it is a good idea to ask around and get recommendations. You will then need to call the office and see if the doctor you have chosen will accept you as a new patient and what types of options they offer for patients who are self-pay. Some doctors offer discounts or will set up payment plans for their patients who do not have insurance, but you will need to ask so you aren't surprised when you get to your appointment.  2) Contact Your Local Health Department Not all health departments have doctors that can see patients for sick visits, but many do, so it is worth a call to see if yours does. If you don't know where your local health department is, you can check in your phone book. The CDC also has a tool to help you locate your state's health department, and many state websites also have listings of all of their local health  departments.  3) Find a [facility] If your illness is not likely to be very severe or complicated, you may want to try a walk in clinic. These are popping up all over the country in pharmacies, drugstores, and shopping centers. They're usually staffed by nurse practitioners or physician assistants that have been trained to treat common illnesses and complaints. They're usually fairly quick and inexpensive. However, if you have serious medical issues or chronic medical problems, these are probably not your best option.  No Primary Care Doctor: - Call Health  Connect at  [phone] - they can help you locate a primary care doctor that  accepts your insurance, provides certain services, etc. - Physician Referral Service- [phone]  Chronic Pain Problems: Organization         Address  Phone   Notes  [facility]  [phone] Patients need to be referred by their primary care doctor.   Medication Assistance: Organization         Address  Phone   Notes  [facility] Medication [facility] [address] [place], [place] [address] [phone] --Must be a resident of [facility] [place] -- Must have NO insurance coverage whatsoever (no Medicaid/ Medicare, etc.) -- The pt. MUST have a primary care doctor that directs their care regularly and follows them in the community   MedAssist  [phone]   [employer]  [phone]    Agencies that provide inexpensive medical care: Organization         Address  Phone   Notes  [place]  [phone]   [facility]    [phone]   [facility] [place], [place] [address] [phone]   [place] [address] [place] [phone]   Planned Parenthood    [phone]   [facility]    [phone]   [place] and [place] [address], [place] Phone:  [phone], Fax:  [phone] Hours of Operation:  9 am - 6 pm, M-F.  Also accepts Medicaid/Medicare and self-pay.  [facility] for [place], [address], [place] Phone: [phone], Fax: [phone]. Hours of Operation:  8:30 am - 5:30 pm, M-F.  Also accepts Medicaid and self-pay.  [facility] [place] [address], [place] Phone: [phone]   [place], [place], [place] [phone], Ext. [phone] Mondays & Thursdays: 7-9 AM.  First 15 patients are seen on a first come, first serve basis.     [place] Providers:  Organization         Address  Phone   Notes  [facility] [address], [place] [phone] Also accepts self-pay patients.  [facility] [id number] [place], [place]  [phone]   [place], [address], [place] [phone]   [facility] [address], [place] [phone]   Lucianne Lei [address], [place]   [phone] Only accepts [place] Access [place] patients after they have their name applied to their card.   Self-Pay (no insurance) in [facility]:  Organization         Address  Phone   Notes  Sickle Cell Patients, [place] Internal  [place] [phone]   [facility] Urgent Care [place] [phone]   [facility] [place]  [place], [address], [place] [phone]   [facility]/Dr. Osei-Bonsu  [address], [place] or [address], [place] [phone] Phone number for both [place] and [place] locations is the same.  Urgent Medical and [facility] [address] [place] [phone]   [facility] [address] [place] or [address] [phone]   [facility] [address] [place] [phone], phone; [phone], fax Sees patients 1st and 3rd Saturday of every month.  Must not qualify for public or private insurance (i.e. Medicaid, Medicare, [place] Health Choice, Veterans' Benefits)  Household income should be no more than 200% of the poverty level The clinic cannot treat you if you are pregnant or think you are pregnant  Sexually transmitted diseases are not treated at the clinic.    Dental Care: Organization         Address  Phone  Notes  [facility]  Department of [facility] [phone] Accepts children up to age 55 who are enrolled in [place] or [place]; pregnant women with a Medicaid card; and children who have applied for Medicaid or [place] Health Choice, but were declined, whose parents can pay a reduced fee at time of service.  [facility] Department of [facility]  [address], [place] [phone] Accepts children up to age 25 who are enrolled in [place] or [place]; pregnant women with a Medicaid card; and children who have applied for Medicaid or [place] Health Choice, but were declined, whose parents can pay a reduced fee at time of service.  [place] Adult Dental Access PROGRAM  [place] [phone] Patients are seen by appointment only. Walk-ins are not accepted. [place] will see patients 12 years of age and older. Monday - Tuesday (8am-5pm) Most Wednesdays (8:30-5pm) $30 per visit, cash only  [facility] Adult Dental Access PROGRAM  [address], [facility] [phone] Patients are seen by appointment only. Walk-ins are not accepted. [place] will see patients 7 years of age and older. One Wednesday Evening (Monthly: Volunteer Based).  $30 per visit, cash only  [place]  [phone] for adults; Children under age 32, call Graduate Pediatric Dentistry at [phone]. Children aged 38-14, please call [phone] to request a pediatric application.  Dental services are provided in all areas of dental care including fillings, crowns and bridges, complete and partial dentures, implants, gum treatment, root canals, and extractions. Preventive care is also provided. Treatment is provided to both adults and children. Patients are selected via a lottery and there is often a waiting list.   [facility] [address], [place]  [phone]  [email or web address]   [facility] [address] [place], [place] [phone], Ext. [phone] Second and Fourth Thursday of each month, opens at 6:30 AM; Clinic ends at 9 AM.  Patients are seen on a first-come first-served basis, and a limited number are seen during each clinic.   [facility]  [address] Hillard Danker [place], [place] [phone]   Eligibility Requirements You must have lived  in [place], [place], or [place] counties for at least the last three months.   You cannot be eligible for state or federal sponsored [employer], including [employer], [place], or [employer].   You generally cannot be eligible for healthcare insurance through your employer.    How to apply: Eligibility screenings are held every Tuesday and Wednesday afternoon from 1:00 pm until 4:00 pm. You do not need an appointment for the interview!  [facility] [address], [place], [place]   [place]  [place] Department  [place]  [phone]    Behavioral Health Resources in the Community: Intensive Outpatient Programs Organization         Address  Phone  Notes  [place]. [address], [place], [place] [phone]   [facility] Outpatient [address], [place], [place]   ADS: Alcohol & Drug Svcs [address] [place], [place]   [place] [address],  [place], [place] or [phone]   Substance Abuse Resources Organization         Address  Phone  Notes  Alcohol and Drug Services  [phone]   [place]  [phone]   The [place]   Chinita Pester  [phone]   Residential & Outpatient Substance Abuse Program  [phone]   Psychological Services Organization          Address  Phone  Notes  [facility] [place]  [place]  [phone]   [place] [address] [place] or [phone]    Mobile Crisis Teams Organization         Address  Phone  Notes  [facility], Mobile Crisis Care Unit  [phone]   [facility]  [address] [place], [place]   [place] Levels [address] [place] [phone]    Self-Help/Support Groups Organization         Address  Phone             Notes  [place]. of [place] - variety of support groups  [place] Call for more information  Narcotics Anonymous (NA), [facility] [address], [employer]  2 meetings at this location   Special educational needs teacher         Address  Phone  Notes  [facility] Residential Treatment [place],    [place]  [phone]   [facility]  [address] [place] [address], [place] [address], [place], [place]   [place], [place] [phone] Admissions: 8am-3pm M-F  Incentives Substance [place] [address],    [place], [place] [phone]   [facility] [address] [place], [place], [place]   [facility] [address],  [place], [place]   [facility] Programs - Intensive Outpatient [address] Kristeen [address], [place], [place]   [facility] ([place].,  [place], [place] or [phone]   [facility]idential Treatment Services (RTS) [address], [place], [place] Accepts Medicaid  [facility] [place] [address]  [place] [phone] Substance Abuse/Addiction Treatment   [place] Resources Organization         Address  Phone  Notes  [facility]  [phone]   Domenic Schwab, PhD [address] Arlis [place], [place]   [phone] or [phone]   Pekin Gillespie [place], [place] [phone]   [address], [place], [place] [phone] Insurance/Medicaid/sponsorship through [facility] and Families [address], [address] [place]                                    [place], [place] [phone] [place] [address], [place] [phone]    Dr. Adele Schilder  [phone]   [facility] Dept. 1) [address], [place] 2) [place] 3)  [place] [address], [place] [phone]  [phone]   [place] [phone] or [phone] (After Hours)

## 2013-10-03 NOTE — ED Notes (Signed)
Josh PA  At bedside

## 2013-10-03 NOTE — ED Notes (Signed)
Pt reports left side rib pain that radiates to her back. Pt reports same pain when diagnosed with bronchitis on New Year's Eve. Pt states pain has worsened since then. Pt reports coughing up yellow sputum. Pt states vomiting since yesterday and vomiting looks watery. Pt denies blood in vomit. Pt denies diarrhea.

## 2013-10-03 NOTE — ED Notes (Signed)
Pt clo left rib cage pain that radiates around to her back since New Year's Eve. Pt states she was seen here for bronchitis but still in severe pain. Pt states that she has been vomiting today.

## 2013-10-03 NOTE — ED Provider Notes (Signed)
CSN: [id number]     Arrival date & time 10/03/13  1820 History   First MD Initiated Contact with Patient 10/03/13 1902     Chief Complaint  Patient presents with  . rib cage pain    (Consider location/radiation/quality/duration/timing/severity/associated sxs/prior Treatment) HPI Comments: Patient with past history of GERD -- presents with c/o L lower rib cage pain anteriorly, lateraly, and posteriorly since 09/24/13. Patient was seen in ED for same but with cough at that time. She had neg CXR. She was d/c to home with levaquin and vicodin which she took. Her cough improved but pain has not. She notes pain is sharp, worse with movement, deep breathing, and palpation. Patient denies risk factors for pulmonary embolism including: unilateral leg swelling, history of DVT/PE/other blood clots, use of estrogens, recent immobilizations, recent surgery, recent travel (>4hr segment), malignancy, hemopysis. No fever. She has had several waves of nausea and vomiting over the past 24 hrs without diarrhea. The onset of this condition was acute. The course is constant. Alleviating factors: certain positions.       The history is provided by the patient.    Past Medical History  Diagnosis Date  . Acid reflux   . Hypertension   . Fibroids 11/08/2011  . Obesity (BMI 30-39.9) 11/08/2011  . Fatty liver 11/08/2011  . Pancreatitis 11/08/2011  . Ventral hernia    Past Surgical History  Procedure Laterality Date  . Orthopedic surgery      R ankle, tendonitis   Family History  Problem Relation Age of Onset  . Hypertension Mother    History  Substance Use Topics  . Smoking status: Current Every Day Smoker -- 0.30 packs/day    Types: Cigarettes  . Smokeless tobacco: Never Used  . Alcohol Use: No   OB History   Grav Para Term Preterm Abortions TAB SAB Ect Mult Living                 Review of Systems  Constitutional: Negative for fever.  HENT: Negative for rhinorrhea and sore throat.   Eyes:  Negative for redness.  Respiratory: Negative for cough.   Cardiovascular: Positive for chest pain and leg swelling (baseline, bilateral, unchanged).  Gastrointestinal: Positive for nausea and vomiting. Negative for abdominal pain and diarrhea.  Genitourinary: Negative for dysuria.  Musculoskeletal: Negative for myalgias.  Skin: Negative for rash.  Neurological: Negative for headaches.    Allergies  Aspirin  Home Medications   Current Outpatient Rx  Name  Route  Sig  Dispense  Refill  . acetaminophen (TYLENOL) 325 MG tablet   Oral   Take 650 mg by mouth every 6 (six) hours as needed (pain).         . cloNIDine (CATAPRES) 0.1 MG tablet   Oral   Take 0.1 mg by mouth 2 (two) times daily.          Marland Kitchen levofloxacin (LEVAQUIN) 500 MG tablet   Oral   Take 1 tablet (500 mg total) by mouth daily.   10 tablet   0   . Linaclotide (LINZESS) 145 MCG CAPS   Oral   Take 1 capsule (145 mcg total) by mouth daily.   30 capsule   6   . omeprazole (PRILOSEC) 20 MG capsule   Oral   Take 20 mg by mouth daily.         . pantoprazole (PROTONIX) 40 MG tablet   Oral   Take 1 tablet (40 mg total) by mouth 2 (two) times  daily.   60 tablet   3    BP 149/83  Temp(Src) 98.1 F (36.7 C) (Oral)  Resp 19  SpO2 95% Physical Exam  Nursing note and vitals reviewed. Constitutional: She appears well-developed and well-nourished.  HENT:  Head: Normocephalic and atraumatic.  Mouth/Throat: Mucous membranes are normal. Mucous membranes are not dry.  Eyes: Conjunctivae are normal.  Neck: Trachea normal and normal range of motion. Neck supple. Normal carotid pulses and no JVD present. No muscular tenderness present. Carotid bruit is not present. No tracheal deviation present.  Cardiovascular: Normal rate, regular rhythm, S1 normal, S2 normal, normal heart sounds and intact distal pulses.  Exam reveals no decreased pulses.   No murmur heard. Pulmonary/Chest: Effort normal. No respiratory  distress. She has no wheezes. She exhibits tenderness.    Abdominal: Soft. Normal aorta and bowel sounds are normal. There is no tenderness. There is no rebound and no guarding.  No pain associated with hernia.   Musculoskeletal: Normal range of motion.  Neurological: She is alert.  Skin: Skin is warm and dry. She is not diaphoretic. No cyanosis. No pallor.  Psychiatric: She has a normal mood and affect.    ED Course  Procedures (including critical care time) Labs Review Labs Reviewed - No data to display Imaging Review No results found.  EKG Interpretation   None      7:49 PM Patient seen and examined. Work-up initiated. Medications ordered.   Vital signs reviewed and are as follows: Filed Vitals:   10/03/13 1831  BP: 149/83  Temp: 98.1 F (36.7 C)  Resp: 19   9:08 PM Patient feeling better. Pain is controlled. She is drinking ginger ale.   Patient counseled on use of narcotic pain medications. Counseled not to combine these medications with others containing tylenol. Urged not to drink alcohol, drive, or perform any other activities that requires focus while taking these medications. The patient verbalizes understanding and agrees with the plan.  Doubt cardiac pain however patient was counseled to return with severe chest pain, especially if the pain is crushing or pressure-like and spreads to the arms, back, neck, or jaw, or if they have sweating, nausea, or shortness of breath with the pain. They were encouraged to call 911 with these symptoms.   They were also told to return if their chest pain gets worse and does not go away with rest, they have an attack of chest pain lasting longer than usual despite rest and treatment with the medications their caregiver has prescribed, if they wake from sleep with chest pain or shortness of breath, if they feel dizzy or faint, if they have chest pain not typical of their usual pain, or if they have any other emergent concerns  regarding their health.  The patient verbalized understanding and agreed.    MDM   1. Chest wall pain    Pain is reproducible with light palpation. Patient appears very uncomfortable with movement. No rash consistent with shingles. She possibly may have costochondritis, pleurisy (less likely). I have no concern for PE given this exam with pain reproduced with very light palpation. She has history of 'pancreatitis' (2/13 at this time had lipase of 61 and normal pancreas on CT). Her symptoms are not consistent with pancreatitis. She appears well. Do not feel repeat imaging is indicated at this time. Will attempt to better control symptoms. She was given pain medication/steroids in ED.     Carlisle Cater, PA-C 10/03/13 2110

## 2013-10-04 NOTE — ED Provider Notes (Signed)
Medical screening examination/treatment/procedure(s) were performed by non-physician practitioner and as supervising physician I was immediately available for consultation/collaboration.  EKG Interpretation   None        Babette Relic, MD 10/04/13 1247

## 2014-08-17 ENCOUNTER — Inpatient Hospital Stay (HOSPITAL_COMMUNITY): Payer: BC Managed Care – PPO

## 2014-08-17 ENCOUNTER — Emergency Department (HOSPITAL_COMMUNITY): Payer: BC Managed Care – PPO

## 2014-08-17 ENCOUNTER — Inpatient Hospital Stay (HOSPITAL_COMMUNITY)
Admission: EM | Admit: 2014-08-17 | Discharge: 2014-08-22 | DRG: 193 | Disposition: A | Discharge status: Home / Self Care | Payer: BC Managed Care – PPO | Attending: Internal Medicine | Admitting: Internal Medicine

## 2014-08-17 ENCOUNTER — Encounter (HOSPITAL_COMMUNITY): Payer: Self-pay | Admitting: Emergency Medicine

## 2014-08-17 DIAGNOSIS — J9601 Acute respiratory failure with hypoxia: Secondary | ICD-10-CM | POA: Diagnosis present

## 2014-08-17 DIAGNOSIS — J189 Pneumonia, unspecified organism: Secondary | ICD-10-CM | POA: Diagnosis not present

## 2014-08-17 DIAGNOSIS — K429 Umbilical hernia without obstruction or gangrene: Secondary | ICD-10-CM | POA: Diagnosis present

## 2014-08-17 DIAGNOSIS — R062 Wheezing: Secondary | ICD-10-CM | POA: Diagnosis not present

## 2014-08-17 DIAGNOSIS — Z713 Dietary counseling and surveillance: Secondary | ICD-10-CM | POA: Diagnosis present

## 2014-08-17 DIAGNOSIS — Z886 Allergy status to analgesic agent status: Secondary | ICD-10-CM

## 2014-08-17 DIAGNOSIS — F17210 Nicotine dependence, cigarettes, uncomplicated: Secondary | ICD-10-CM | POA: Diagnosis present

## 2014-08-17 DIAGNOSIS — K420 Umbilical hernia with obstruction, without gangrene: Secondary | ICD-10-CM | POA: Diagnosis present

## 2014-08-17 DIAGNOSIS — Z6841 Body Mass Index (BMI) 40.0 and over, adult: Secondary | ICD-10-CM | POA: Diagnosis not present

## 2014-08-17 DIAGNOSIS — I10 Essential (primary) hypertension: Secondary | ICD-10-CM | POA: Diagnosis present

## 2014-08-17 DIAGNOSIS — J9600 Acute respiratory failure, unspecified whether with hypoxia or hypercapnia: Secondary | ICD-10-CM

## 2014-08-17 DIAGNOSIS — F17200 Nicotine dependence, unspecified, uncomplicated: Secondary | ICD-10-CM

## 2014-08-17 DIAGNOSIS — R112 Nausea with vomiting, unspecified: Secondary | ICD-10-CM

## 2014-08-17 DIAGNOSIS — R911 Solitary pulmonary nodule: Secondary | ICD-10-CM | POA: Diagnosis present

## 2014-08-17 DIAGNOSIS — Z720 Tobacco use: Secondary | ICD-10-CM | POA: Diagnosis present

## 2014-08-17 DIAGNOSIS — R609 Edema, unspecified: Secondary | ICD-10-CM

## 2014-08-17 DIAGNOSIS — K219 Gastro-esophageal reflux disease without esophagitis: Secondary | ICD-10-CM | POA: Diagnosis present

## 2014-08-17 LAB — CBC WITH DIFFERENTIAL/PLATELET
Basophils Absolute: 0 10*3/uL (ref 0.0–0.1)
Basophils Relative: 0 % (ref 0–1)
Eosinophils Absolute: 0 10*3/uL (ref 0.0–0.7)
Eosinophils Relative: 0 % (ref 0–5)
HEMATOCRIT: 48 % — AB (ref 36.0–46.0)
HEMOGLOBIN: 15.6 g/dL — AB (ref 12.0–15.0)
LYMPHS ABS: 0.7 10*3/uL (ref 0.7–4.0)
Lymphocytes Relative: 16 % (ref 12–46)
MCH: 29.1 pg (ref 26.0–34.0)
MCHC: 32.5 g/dL (ref 30.0–36.0)
MCV: 89.4 fL (ref 78.0–100.0)
MONO ABS: 0.4 10*3/uL (ref 0.1–1.0)
Monocytes Relative: 8 % (ref 3–12)
NEUTROS ABS: 3.4 10*3/uL (ref 1.7–7.7)
NEUTROS PCT: 76 % (ref 43–77)
Platelets: 157 10*3/uL (ref 150–400)
RBC: 5.37 MIL/uL — ABNORMAL HIGH (ref 3.87–5.11)
RDW: 14.7 % (ref 11.5–15.5)
WBC: 4.5 10*3/uL (ref 4.0–10.5)

## 2014-08-17 LAB — INFLUENZA PANEL BY PCR (TYPE A & B)
H1N1FLUPCR: NOT DETECTED
INFLAPCR: NEGATIVE
Influenza B By PCR: NEGATIVE

## 2014-08-17 LAB — BASIC METABOLIC PANEL
ANION GAP: 14 (ref 5–15)
BUN: 8 mg/dL (ref 6–23)
CHLORIDE: 95 meq/L — AB (ref 96–112)
CO2: 26 mEq/L (ref 19–32)
CREATININE: 0.63 mg/dL (ref 0.50–1.10)
Calcium: 9.3 mg/dL (ref 8.4–10.5)
GFR calc Af Amer: >90 mL/min (ref >90)
GFR calc non Af Amer: >90 mL/min (ref >90)
Glucose, Bld: 103 mg/dL — ABNORMAL HIGH (ref 70–99)
POTASSIUM: 4.4 meq/L (ref 3.7–5.3)
Sodium: 135 mEq/L — ABNORMAL LOW (ref 137–147)

## 2014-08-17 LAB — URINALYSIS, ROUTINE W REFLEX MICROSCOPIC
Bilirubin Urine: NEGATIVE
GLUCOSE, UA: NEGATIVE mg/dL
KETONES UR: NEGATIVE mg/dL
Leukocytes, UA: NEGATIVE
Nitrite: NEGATIVE
PH: 5.5 (ref 5.0–8.0)
Protein, ur: 100 mg/dL — AB
Specific Gravity, Urine: 1.007 (ref 1.005–1.030)
Urobilinogen, UA: 1 mg/dL (ref 0.0–1.0)

## 2014-08-17 LAB — URINE MICROSCOPIC-ADD ON

## 2014-08-17 LAB — TSH: TSH: 0.491 u[IU]/mL (ref 0.350–4.500)

## 2014-08-17 LAB — STREP PNEUMONIAE URINARY ANTIGEN: Strep Pneumo Urinary Antigen: NEGATIVE

## 2014-08-17 LAB — I-STAT CG4 LACTIC ACID, ED: Lactic Acid, Venous: 0.55 mmol/L (ref 0.5–2.2)

## 2014-08-17 MED ORDER — PNEUMOCOCCAL VAC POLYVALENT 25 MCG/0.5ML IJ INJ
0.5000 mL | INJECTION | INTRAMUSCULAR | Status: AC
  Administered 2014-08-18: 0.5 mL via INTRAMUSCULAR
  Filled 2014-08-17 (×2): qty 0.5

## 2014-08-17 MED ORDER — IPRATROPIUM-ALBUTEROL 0.5-2.5 (3) MG/3ML IN SOLN
3.0000 mL | Freq: Once | RESPIRATORY_TRACT | Status: AC
Start: 2014-08-17 — End: 2014-08-17
  Administered 2014-08-17: 3 mL via RESPIRATORY_TRACT
  Filled 2014-08-17: qty 3

## 2014-08-17 MED ORDER — ALBUTEROL SULFATE (2.5 MG/3ML) 0.083% IN NEBU
2.5000 mg | INHALATION_SOLUTION | Freq: Once | RESPIRATORY_TRACT | Status: AC
  Administered 2014-08-17: 2.5 mg via RESPIRATORY_TRACT

## 2014-08-17 MED ORDER — ACETAMINOPHEN 325 MG PO TABS
650.0000 mg | ORAL_TABLET | Freq: Once | ORAL | Status: AC
  Administered 2014-08-17: 650 mg via ORAL
  Filled 2014-08-17: qty 2

## 2014-08-17 MED ORDER — NICOTINE 14 MG/24HR TD PT24
14.0000 mg | MEDICATED_PATCH | Freq: Every day | TRANSDERMAL | Status: DC
  Administered 2014-08-17 – 2014-08-22 (×6): 14 mg via TRANSDERMAL
  Filled 2014-08-17 (×6): qty 1

## 2014-08-17 MED ORDER — ONDANSETRON HCL 4 MG PO TABS: 4.0000 mg | ORAL_TABLET | Freq: Four times a day (QID) | ORAL | Status: DC | PRN

## 2014-08-17 MED ORDER — PREDNISONE 20 MG PO TABS
60.0000 mg | ORAL_TABLET | Freq: Once | ORAL | Status: AC
  Administered 2014-08-17: 60 mg via ORAL
  Filled 2014-08-17: qty 3

## 2014-08-17 MED ORDER — ONDANSETRON HCL 4 MG/2ML IJ SOLN
4.0000 mg | Freq: Once | INTRAMUSCULAR | Status: AC
  Administered 2014-08-17: 4 mg via INTRAVENOUS
  Filled 2014-08-17: qty 2

## 2014-08-17 MED ORDER — INFLUENZA VAC SPLIT QUAD 0.5 ML IM SUSY
0.5000 mL | PREFILLED_SYRINGE | INTRAMUSCULAR | Status: AC
  Administered 2014-08-18: 0.5 mL via INTRAMUSCULAR
  Filled 2014-08-17 (×2): qty 0.5

## 2014-08-17 MED ORDER — ALBUTEROL SULFATE (2.5 MG/3ML) 0.083% IN NEBU
2.5000 mg | INHALATION_SOLUTION | Freq: Four times a day (QID) | RESPIRATORY_TRACT | Status: DC
  Administered 2014-08-18 – 2014-08-20 (×10): 2.5 mg via RESPIRATORY_TRACT
  Filled 2014-08-17 (×10): qty 3

## 2014-08-17 MED ORDER — ACETAMINOPHEN 325 MG PO TABS: 650.0000 mg | ORAL_TABLET | Freq: Four times a day (QID) | ORAL | Status: DC | PRN

## 2014-08-17 MED ORDER — HYDROCOD POLST-CHLORPHEN POLST 10-8 MG/5ML PO LQCR
5.0000 mL | Freq: Once | ORAL | Status: AC
  Administered 2014-08-17: 5 mL via ORAL
  Filled 2014-08-17: qty 5

## 2014-08-17 MED ORDER — SODIUM CHLORIDE 0.9 % IV SOLN
INTRAVENOUS | Status: DC
  Administered 2014-08-17: 18:00:00 via INTRAVENOUS

## 2014-08-17 MED ORDER — METHYLPREDNISOLONE SODIUM SUCC 125 MG IJ SOLR
60.0000 mg | Freq: Four times a day (QID) | INTRAMUSCULAR | Status: DC
  Administered 2014-08-17 – 2014-08-21 (×15): 60 mg via INTRAVENOUS
  Filled 2014-08-17 (×19): qty 0.96

## 2014-08-17 MED ORDER — ALBUTEROL SULFATE (2.5 MG/3ML) 0.083% IN NEBU
INHALATION_SOLUTION | RESPIRATORY_TRACT | Status: AC
Start: 2014-08-17 — End: 2014-08-17
  Filled 2014-08-17: qty 3

## 2014-08-17 MED ORDER — PANTOPRAZOLE SODIUM 40 MG PO TBEC
40.0000 mg | DELAYED_RELEASE_TABLET | Freq: Every day | ORAL | Status: DC
Start: 2014-08-17 — End: 2014-08-22
  Administered 2014-08-17 – 2014-08-22 (×6): 40 mg via ORAL
  Filled 2014-08-17 (×7): qty 1

## 2014-08-17 MED ORDER — GUAIFENESIN 100 MG/5ML PO SYRP
200.0000 mg | ORAL_SOLUTION | ORAL | Status: DC | PRN
  Administered 2014-08-17 – 2014-08-21 (×8): 200 mg via ORAL
  Filled 2014-08-17 (×10): qty 10

## 2014-08-17 MED ORDER — HYDROCODONE-ACETAMINOPHEN 5-325 MG PO TABS
1.0000 | ORAL_TABLET | ORAL | Status: DC | PRN
  Administered 2014-08-17 – 2014-08-22 (×15): 2 via ORAL
  Filled 2014-08-17 (×15): qty 2

## 2014-08-17 MED ORDER — ENOXAPARIN SODIUM 40 MG/0.4ML ~~LOC~~ SOLN: 40.0000 mg | SUBCUTANEOUS | Status: DC

## 2014-08-17 MED ORDER — CEFTRIAXONE SODIUM IN DEXTROSE 20 MG/ML IV SOLN
1.0000 g | INTRAVENOUS | Status: DC
  Administered 2014-08-18 – 2014-08-22 (×5): 1 g via INTRAVENOUS
  Filled 2014-08-17 (×5): qty 50

## 2014-08-17 MED ORDER — SODIUM CHLORIDE 0.9 % IV SOLN
1000.0000 mL | Freq: Once | INTRAVENOUS | Status: AC
  Administered 2014-08-17: 1000 mL via INTRAVENOUS

## 2014-08-17 MED ORDER — DEXTROSE 5 % IV SOLN
500.0000 mg | Freq: Once | INTRAVENOUS | Status: AC
  Administered 2014-08-17: 500 mg via INTRAVENOUS
  Filled 2014-08-17: qty 500

## 2014-08-17 MED ORDER — SODIUM CHLORIDE 0.9 % IV BOLUS (SEPSIS)
1000.0000 mL | Freq: Once | INTRAVENOUS | Status: AC
  Administered 2014-08-17: 1000 mL via INTRAVENOUS

## 2014-08-17 MED ORDER — ACETAMINOPHEN 650 MG RE SUPP: 650.0000 mg | Freq: Four times a day (QID) | RECTAL | Status: DC | PRN

## 2014-08-17 MED ORDER — ALBUTEROL SULFATE (2.5 MG/3ML) 0.083% IN NEBU
2.5000 mg | INHALATION_SOLUTION | RESPIRATORY_TRACT | Status: DC
  Administered 2014-08-17 (×2): 2.5 mg via RESPIRATORY_TRACT
  Filled 2014-08-17 (×2): qty 3

## 2014-08-17 MED ORDER — ENOXAPARIN SODIUM 80 MG/0.8ML ~~LOC~~ SOLN
0.5000 mg/kg | SUBCUTANEOUS | Status: DC
  Administered 2014-08-17 – 2014-08-21 (×5): 65 mg via SUBCUTANEOUS
  Filled 2014-08-17 (×6): qty 0.8

## 2014-08-17 MED ORDER — DEXTROSE 5 % IV SOLN
500.0000 mg | INTRAVENOUS | Status: DC
  Administered 2014-08-18 – 2014-08-21 (×4): 500 mg via INTRAVENOUS
  Filled 2014-08-17 (×5): qty 500

## 2014-08-17 MED ORDER — ALUM & MAG HYDROXIDE-SIMETH 200-200-20 MG/5ML PO SUSP: 30.0000 mL | Freq: Four times a day (QID) | ORAL | Status: DC | PRN

## 2014-08-17 MED ORDER — ONDANSETRON HCL 4 MG/2ML IJ SOLN: 4.0000 mg | Freq: Four times a day (QID) | INTRAMUSCULAR | Status: DC | PRN

## 2014-08-17 MED ORDER — SODIUM CHLORIDE 0.9 % IJ SOLN
3.0000 mL | Freq: Two times a day (BID) | INTRAMUSCULAR | Status: DC
  Administered 2014-08-18 – 2014-08-22 (×7): 3 mL via INTRAVENOUS

## 2014-08-17 MED ORDER — DEXTROSE 5 % IV SOLN
1.0000 g | Freq: Once | INTRAVENOUS | Status: AC
  Administered 2014-08-17: 1 g via INTRAVENOUS
  Filled 2014-08-17: qty 10

## 2014-08-17 MED ORDER — MORPHINE SULFATE 2 MG/ML IJ SOLN: 1.0000 mg | INTRAMUSCULAR | Status: DC | PRN

## 2014-08-17 MED ORDER — ALBUTEROL SULFATE (2.5 MG/3ML) 0.083% IN NEBU
2.5000 mg | INHALATION_SOLUTION | RESPIRATORY_TRACT | Status: DC | PRN
  Administered 2014-08-20: 2.5 mg via RESPIRATORY_TRACT
  Filled 2014-08-17: qty 3

## 2014-08-17 NOTE — Plan of Care (Signed)
Problem: Phase I Progression Outcomes Goal: Pain controlled with appropriate interventions Outcome: Completed/Met Date Met:  08/17/14 Goal: Voiding-avoid urinary catheter unless indicated Outcome: Completed/Met Date Met:  08/17/14

## 2014-08-17 NOTE — ED Notes (Addendum)
Pt states that since Friday she has had a dry cough, generalized body aches, and SOB, denies chest pain. Pt states SOB gets worse with exertion and also get dizzy.

## 2014-08-17 NOTE — ED Notes (Signed)
Pt placed on 2 liters of oxygen per Ray, MD. Pt oxygen saturation before was 92%.

## 2014-08-17 NOTE — ED Notes (Signed)
Hospitalist in room, mention umbilical hernia that he pushed back in. Pt states she did have a small one but from all the coughing pushed it out more.

## 2014-08-17 NOTE — Progress Notes (Signed)
PHARMACY CONSULT: Lovenox for VTE prophylaxis   Wt: 133.4 kg BMI:  47 SCr: 0.63 CrCl >30 ml/hr  H/H: 15.6/48 Pltc: 157  A/P:  1.  Due to morbid obesity (BMI>30), begin weight-adjusted lovenox 0.5mg /kg sq q24h = Lovenox 65 mg SQ q24h 2.  Monitor CBC and renal function, adjust as needed  Hershal Coria, PharmD, BCPS Pager: [phone] 08/17/2014 1:51 PM

## 2014-08-17 NOTE — H&P (Signed)
[facility] History and Physical  Sharon Swanson JOA:[id number] DOB: Mar 27, 1961 DOA: 08/17/2014   PCP: Doesn't recall her PCPs name. However, hasn't seen her physician in over a year. Specialists: None  Chief Complaint: Cough and shortness of breath  HPI: Sharon Swanson is a 53 y.o. female with a past medical history of hypertension who currently is not taking any of her medications, who was in her usual state of health till Friday night when she started developing a cough. It was a dry cough. It got worse on Saturday and Sunday. And then she started developing shortness of breath. Denies any blood with coughing. She complains of follow-up generalized body aches all over from her chest to her knees. Also complains of headache. Has had chills. Did not check her temperature at home. Has vomited about 4 times in the last emesis was yesterday. Has had a few loose stools for the last episode being yesterday. Has had decreased appetite. Denies any weight loss over the last few months. No sick contacts. No recent travel. She works as a Training and development officer in [employer]. However, is not in contact with children.  Home Medications: Please note that she hasn't taken any of these medications for the last 7-8 months. Prior to Admission medications   Medication Sig Start Date End Date Taking? Authorizing Provider  acetaminophen (TYLENOL) 325 MG tablet Take 650 mg by mouth every 6 (six) hours as needed (pain).   Yes Historical Provider, MD  amLODipine (NORVASC) 10 MG tablet Take 10 mg by mouth daily.    Historical Provider, MD  cloNIDine (CATAPRES) 0.1 MG tablet Take 0.1 mg by mouth 2 (two) times daily.  07/15/12   Historical Provider, MD  furosemide (LASIX) 20 MG tablet Take 20 mg by mouth daily.    Historical Provider, MD  levofloxacin (LEVAQUIN) 500 MG tablet Take 1 tablet (500 mg total) by mouth daily. Patient not taking: Reported on 08/17/2014 09/24/13   Tanna Furry, MD  Linaclotide [facility]) 145  MCG CAPS Take 1 capsule (145 mcg total) by mouth daily. 07/02/12   Jerene Bears, MD  omeprazole (PRILOSEC) 20 MG capsule Take 20 mg by mouth daily.    Historical Provider, MD  oxyCODONE-acetaminophen (PERCOCET/ROXICET) 5-325 MG per tablet Take 1 tablet by mouth every 6 (six) hours as needed for severe pain. Patient not taking: Reported on 08/17/2014 10/03/13   Carlisle Cater, PA-C  pantoprazole (PROTONIX) 40 MG tablet Take 1 tablet (40 mg total) by mouth 2 (two) times daily. 08/15/12   Jerene Bears, MD    Allergies:  Allergies  Allergen Reactions  . Aspirin     Abdominal pain    Past Medical History: Past Medical History  Diagnosis Date  . Acid reflux   . Hypertension   . Fibroids 11/08/2011  . Obesity (BMI 30-39.9) 11/08/2011  . Fatty liver 11/08/2011  . Pancreatitis 11/08/2011  . Ventral hernia     Past Surgical History  Procedure Laterality Date  . Orthopedic surgery      R ankle, tendonitis    Social History: She lives in [place] with her fianc. Smokes 1/3-1/2 a pack of cigarettes on a daily basis. No alcohol use. No illicit drug use. Independent with daily activities.  Family History:  Family History  Problem Relation Age of Onset  . Hypertension Mother   There is also history of diabetes in the family.  Review of Systems - History obtained from the patient General ROS: positive for  - fatigue Psychological ROS: negative  Ophthalmic ROS: negative ENT ROS: negative Allergy and Immunology ROS: negative Hematological and Lymphatic ROS: negative Endocrine ROS: negative Respiratory ROS: as in hpi Cardiovascular ROS: as in hpi Gastrointestinal ROS: as in hpi Genito-Urinary ROS: no dysuria, trouble voiding, or hematuria Musculoskeletal ROS: negative Neurological ROS: no TIA or stroke symptoms Dermatological ROS: negative  Physical Examination  Filed Vitals:   08/17/14 0827 08/17/14 1100 08/17/14 1235  BP: 167/91 139/80   Pulse: 115 117 108  Temp: 102.2 F (39  C)    TempSrc: Oral    Resp: _0 SpO2: 95% 90% 96%    BP 139/80 mmHg  Pulse 108  Temp(Src) 102.2 F (39 C) (Oral)  Resp 16  SpO2 96%  General appearance: alert, cooperative, appears stated age, no distress and morbidly obese Head: Normocephalic, without obvious abnormality, atraumatic Eyes: conjunctivae/corneas clear. PERRL, EOM's intact.  Throat: lips, mucosa, and tongue normal; teeth and gums normal Neck: no adenopathy, no carotid bruit, no JVD, supple, symmetrical, trachea midline and thyroid not enlarged, symmetric, no tenderness/mass/nodules Back: symmetric, no curvature. ROM normal. No CVA tenderness. Resp: Diffuse end expiratory wheezing bilaterally with rhonchi and crackles at the bases. Cardio: S1, S2 is tachycardic. Regular. No S3, S4. No rubs, murmurs, or bruit. Pedal edema is appreciated. GI: Abdomen is obese. Soft. Umbilical hernia was noted. It was reduced easily. Bowel sounds are present. No masses or organomegaly. Extremities: edema Pitting bilaterally Pulses: 2+ and symmetric Skin: Skin color, texture, turgor normal. No rashes or lesions Neurologic: Alert and oriented 3. No focal neurological deficits are noted.  Laboratory Data: Results for orders placed or performed during the hospital encounter of 08/17/14 (from the past 48 hour(s))  Basic metabolic panel     Status: Abnormal   Collection Time: 08/17/14  8:50 AM  Result Value Ref Range   Sodium 135 (L) 137 - 147 mEq/L   Potassium 4.4 3.7 - 5.3 mEq/L   Chloride 95 (L) 96 - 112 mEq/L   CO2 26 19 - 32 mEq/L   Glucose, Bld 103 (H) 70 - 99 mg/dL   BUN 8 6 - 23 mg/dL   Creatinine, Ser 0.63 0.50 - 1.10 mg/dL   Calcium 9.3 8.4 - 10.5 mg/dL   GFR calc non Af Amer >90 >90 mL/min   GFR calc Af Amer >90 >90 mL/min    Comment: (NOTE) The eGFR has been calculated using the CKD EPI equation. This calculation has not been validated in all clinical situations. eGFR's persistently <90 mL/min signify possible  Chronic Kidney Disease.    Anion gap 14 5 - 15  CBC with Differential     Status: Abnormal   Collection Time: 08/17/14  8:50 AM  Result Value Ref Range   WBC 4.5 4.0 - 10.5 K/uL   RBC 5.37 (H) 3.87 - 5.11 MIL/uL   Hemoglobin 15.6 (H) 12.0 - 15.0 g/dL   HCT 48.0 (H) 36.0 - 46.0 %   MCV 89.4 78.0 - 100.0 fL   MCH 29.1 26.0 - 34.0 pg   MCHC 32.5 30.0 - 36.0 g/dL   RDW 14.7 11.5 - 15.5 %   Platelets 157 150 - 400 K/uL   Neutrophils Relative % 76 43 - 77 %   Neutro Abs 3.4 1.7 - 7.7 K/uL   Lymphocytes Relative 16 12 - 46 %   Lymphs Abs 0.7 0.7 - 4.0 K/uL   Monocytes Relative 8 3 - 12 %   Monocytes Absolute 0.4 0.1 - 1.0 K/uL  Eosinophils Relative 0 0 - 5 %   Eosinophils Absolute 0.0 0.0 - 0.7 K/uL   Basophils Relative 0 0 - 1 %   Basophils Absolute 0.0 0.0 - 0.1 K/uL  I-Stat CG4 Lactic Acid, ED     Status: None   Collection Time: 08/17/14  9:57 AM  Result Value Ref Range   Lactic Acid, Venous 0.55 0.5 - 2.2 mmol/L    Radiology Reports: Dg Chest 2 View  08/17/2014   CLINICAL DATA:  Cough and congestion for 3 days; shortness of breath  EXAM: CHEST  2 VIEW  COMPARISON:  September 24, 2013  FINDINGS: There is infiltrate in the axillary subsegment of the right upper lobe. In addition, there is a nodular opacity in the left mid lung measuring 1.5 x 0.9 cm. Lungs elsewhere clear. Heart size and pulmonary vascularity are normal. No adenopathy. No bone lesions. There is diffuse idiopathic skeletal hyperostosis in the thoracic region.  IMPRESSION: 1.5 x 0.9 cm nodular opacity left mid lung. Advise noncontrast enhanced chest CT to further assess this lesion.  Infiltrate in the axillary subsegment of the right upper lobe.  Diffuse idiopathic skeletal hyperostosis in the thoracic spine.   Electronically Signed   By: Lowella Grip M.D.   On: 08/17/2014 09:18    Electrocardiogram: Sinus tachycardia with left axis deviation. Nonspecific ST and T wave changes.  Problem List  Principal  Problem:   CAP (community acquired pneumonia) Active Problems:   Tobacco abuse   Obesity (BMI 30-39.9)   Acute respiratory failure   Umbilical hernia   Pulmonary nodule, left   Assessment: This is a 53 year old African-American female with past medical history as stated earlier, who presents with the cough, shortness of breath. She was wheezing profusely. She is found to have a fever. Infiltrate was noted in the chest x-ray. There is also pulmonary nodule. Influenza is a possibility apart from the community-acquired pneumonia. Transient GI symptoms appear to be related to acute illness. Will require further workup if they persist.  Plan: #1 Acute respiratory failure with the borderline hypoxia: She was started on oxygen in the emergency department. Community acquired pneumonia is detected on chest x-ray. She'll be given antibiotics. She'll be given nebulizer treatments and steroids.  #2 Community acquired pneumonia: Antibiotics will be given. Urine for strep and legionella antigens will be checked. Check HIV. Influenza PCR will be ordered.  #3 Left pulmonary nodule: CT scan without contrast will be obtained. She is a smoker. Hence, this is concerning.  #4 Umbilical hernia: This apparently worsened after her coughing episodes. I did manage to reduce it. Needs to be monitored closely. Check abdominal xray.  #5 Pedal edema: She does have pitting edema. She was given IV fluids in the ED. We'll hold off on further fluids. We'll check venous Doppler and order echocardiogram. Check LFTs and get a UA as well.  #6 history of tobacco abuse: Nicotine patch will be prescribed.  #7 Morbid obesity: She'll need weight loss counseling.   DVT Prophylaxis: Lovenox Code Status: Full code Family Communication: Discussed with the patient  Disposition Plan: Admit to telemetry   Further management decisions will depend on results of further testing and patient's response to  treatment.   [facility]  [facility] Pager [phone]  If 7PM-7AM, please contact night-coverage [email or web address] Password [email or web address]  08/17/2014, 1:13 PM

## 2014-08-17 NOTE — Progress Notes (Signed)
VASCULAR LAB PRELIMINARY  PRELIMINARY  PRELIMINARY  PRELIMINARY  Bilateral lower extremity venous duplex  completed.    Preliminary report:  Bilateral:  No evidence of DVT, superficial thrombosis, or Baker's Cyst.   Everlena Mackley, RVT 08/17/2014, 3:54 PM

## 2014-08-17 NOTE — ED Provider Notes (Signed)
CSN: [id number]     Arrival date & time 08/17/14  0807 History   First MD Initiated Contact with Patient 08/17/14 [phone]     Chief Complaint  Patient presents with  . Cough  . Generalized Body Aches  . Shortness of Breath     (Consider location/radiation/quality/duration/timing/severity/associated sxs/prior Treatment) HPI  53 y.o. Female complaining of cough and sob for 3 days.  She began with uri symptoms on Friday.  She has cough with occasional sputum production.  She is dyspneic and continues to smoke.  She use an inhaler from last year one time.  She has used otc cold and cough medicine without relief.  She has no history of prior respiratory related hospitalizations but was here last year for a cough with associated wheezing.  She denies chest pain but has felt warm.  She is taking po fluids without difficulty and denies vomiting or diarrhea.  She also complains of generalized muscle aches.  She is not taking any of her prescription medicines as she is out of all of them.    Past Medical History  Diagnosis Date  . Acid reflux   . Hypertension   . Fibroids 11/08/2011  . Obesity (BMI 30-39.9) 11/08/2011  . Fatty liver 11/08/2011  . Pancreatitis 11/08/2011  . Ventral hernia    Past Surgical History  Procedure Laterality Date  . Orthopedic surgery      R ankle, tendonitis   Family History  Problem Relation Age of Onset  . Hypertension Mother    History  Substance Use Topics  . Smoking status: Current Every Day Smoker -- 0.30 packs/day    Types: Cigarettes  . Smokeless tobacco: Never Used  . Alcohol Use: No   OB History    No data available     Review of Systems  Constitutional: Positive for chills, activity change and appetite change.  HENT: Positive for congestion.   Eyes: Negative.   Respiratory: Positive for shortness of breath.   Cardiovascular: Negative for chest pain, palpitations and leg swelling.  Gastrointestinal: Negative.   Endocrine: Negative.    Genitourinary: Negative.   Musculoskeletal: Positive for myalgias.  Skin: Negative for rash.  Allergic/Immunologic: Negative.   Neurological: Negative.   Hematological: Negative.   Psychiatric/Behavioral: Negative.   All other systems reviewed and are negative.     Allergies  Aspirin  Home Medications   Prior to Admission medications   Medication Sig Start Date End Date Taking? Authorizing Provider  acetaminophen (TYLENOL) 325 MG tablet Take 650 mg by mouth every 6 (six) hours as needed (pain).    Historical Provider, MD  cloNIDine (CATAPRES) 0.1 MG tablet Take 0.1 mg by mouth 2 (two) times daily.  07/15/12   Historical Provider, MD  levofloxacin (LEVAQUIN) 500 MG tablet Take 1 tablet (500 mg total) by mouth daily. 09/24/13   Tanna Furry, MD  Linaclotide [facility]) 145 MCG CAPS Take 1 capsule (145 mcg total) by mouth daily. 07/02/12   Jerene Bears, MD  omeprazole (PRILOSEC) 20 MG capsule Take 20 mg by mouth daily.    Historical Provider, MD  oxyCODONE-acetaminophen (PERCOCET/ROXICET) 5-325 MG per tablet Take 1 tablet by mouth every 6 (six) hours as needed for severe pain. 10/03/13   Carlisle Cater, PA-C  pantoprazole (PROTONIX) 40 MG tablet Take 1 tablet (40 mg total) by mouth 2 (two) times daily. 08/15/12   Jerene Bears, MD   BP 167/91 mmHg  Pulse 115  Temp(Src) 102.2 F (39 C) (Oral)  Resp 20  SpO2 95% Physical Exam  Constitutional: She is oriented to person, place, and time. She appears well-developed and well-nourished. She is not intubated.  obese  HENT:  Head: Normocephalic.  Right Ear: External ear normal.  Left Ear: External ear normal.  Nose: Nose normal.  Mouth/Throat: Oropharynx is clear and moist.  Eyes: Conjunctivae and EOM are normal. Pupils are equal, round, and reactive to light.  Neck: Normal range of motion. Neck supple.  Cardiovascular: Tachycardia present.   Pulmonary/Chest: No accessory muscle usage. Tachypnea noted. She is not intubated. No respiratory  distress. She has no decreased breath sounds. She has wheezes. She has rhonchi.  Abdominal: Soft. There is no tenderness.  Musculoskeletal: Normal range of motion. She exhibits edema. She exhibits no tenderness.  Neurological: She is alert and oriented to person, place, and time. She has normal reflexes.  Skin: Skin is warm and dry.  Psychiatric: She has a normal mood and affect. Her behavior is normal. Thought content normal.  Nursing note and vitals reviewed.   ED Course  Procedures (including critical care time) Labs Review Labs Reviewed  BASIC METABOLIC PANEL - Abnormal; Notable for the following:    Sodium 135 (*)    Chloride 95 (*)    Glucose, Bld 103 (*)    All other components within normal limits  CBC WITH DIFFERENTIAL - Abnormal; Notable for the following:    RBC 5.37 (*)    Hemoglobin 15.6 (*)    HCT 48.0 (*)    All other components within normal limits    Imaging Review Dg Chest 2 View  08/17/2014   CLINICAL DATA:  Cough and congestion for 3 days; shortness of breath  EXAM: CHEST  2 VIEW  COMPARISON:  September 24, 2013  FINDINGS: There is infiltrate in the axillary subsegment of the right upper lobe. In addition, there is a nodular opacity in the left mid lung measuring 1.5 x 0.9 cm. Lungs elsewhere clear. Heart size and pulmonary vascularity are normal. No adenopathy. No bone lesions. There is diffuse idiopathic skeletal hyperostosis in the thoracic region.  IMPRESSION: 1.5 x 0.9 cm nodular opacity left mid lung. Advise noncontrast enhanced chest CT to further assess this lesion.  Infiltrate in the axillary subsegment of the right upper lobe.  Diffuse idiopathic skeletal hyperostosis in the thoracic spine.   Electronically Signed   By: Lowella Grip M.D.   On: 08/17/2014 09:18     EKG Interpretation   Date/Time:  Monday August 17 2014 08:26:13 EST Ventricular Rate:  116 PR Interval:  84 QRS Duration: 75 QT Interval:  416 QTC Calculation: 578 R Axis:    -69 Text Interpretation:  Sinus tachycardia Left anterior fascicular block  Prolonged QT interval Non-specific ST-t changes Confirmed by Wilson Singer  MD,  STEPHEN ([id number]) on 08/17/2014 8:30:33 AM      MDM   Final diagnoses:  CAP (community acquired pneumonia)  Wheezing  Smoker  Lung nodule    53 y.o. Female smoker presents with cough, fever, dyspnea and wheezing.  Patient tachypneic and given fluid bolus.  Placed on wheeze protocol, po steroids given.  Patient with infiltrate on cxr and treated for cap with rocephin and zithromax.  Awaiting post fluid bolus vital signs and lactic acid.  Patient likely to require admission.    Some wheezing although improved after nebs.  Patient continues tachycardiac with sats 90-92%.  Patient with oxygen ordered. Plan admission for further treatment.   Shaune Pollack, MD 08/17/14 [phone]

## 2014-08-18 DIAGNOSIS — E669 Obesity, unspecified: Secondary | ICD-10-CM

## 2014-08-18 DIAGNOSIS — Z720 Tobacco use: Secondary | ICD-10-CM

## 2014-08-18 LAB — COMPREHENSIVE METABOLIC PANEL
ALT: 55 U/L — ABNORMAL HIGH (ref 0–35)
AST: 29 U/L (ref 0–37)
Albumin: 3.6 g/dL (ref 3.5–5.2)
Alkaline Phosphatase: 67 U/L (ref 39–117)
Anion gap: 14 (ref 5–15)
BUN: 12 mg/dL (ref 6–23)
CO2: 26 mEq/L (ref 19–32)
Calcium: 9.6 mg/dL (ref 8.4–10.5)
Chloride: 98 mEq/L (ref 96–112)
Creatinine, Ser: 0.54 mg/dL (ref 0.50–1.10)
GFR calc Af Amer: >90 mL/min (ref >90)
GFR calc non Af Amer: >90 mL/min (ref >90)
Glucose, Bld: 188 mg/dL — ABNORMAL HIGH (ref 70–99)
Potassium: 4.6 mEq/L (ref 3.7–5.3)
Sodium: 138 mEq/L (ref 137–147)
Total Bilirubin: 0.2 mg/dL — ABNORMAL LOW (ref 0.3–1.2)
Total Protein: 8.1 g/dL (ref 6.0–8.3)

## 2014-08-18 LAB — CBC
HCT: 49.5 % — ABNORMAL HIGH (ref 36.0–46.0)
HEMOGLOBIN: 15.9 g/dL — AB (ref 12.0–15.0)
MCH: 28.9 pg (ref 26.0–34.0)
MCHC: 32.1 g/dL (ref 30.0–36.0)
MCV: 90 fL (ref 78.0–100.0)
Platelets: 166 10*3/uL (ref 150–400)
RBC: 5.5 MIL/uL — ABNORMAL HIGH (ref 3.87–5.11)
RDW: 14.7 % (ref 11.5–15.5)
WBC: 4.4 10*3/uL (ref 4.0–10.5)

## 2014-08-18 LAB — HIV ANTIBODY (ROUTINE TESTING W REFLEX): HIV 1&2 Ab, 4th Generation: NONREACTIVE

## 2014-08-18 MED ORDER — HYDROCOD POLST-CHLORPHEN POLST 10-8 MG/5ML PO LQCR
5.0000 mL | Freq: Two times a day (BID) | ORAL | Status: DC | PRN
  Administered 2014-08-18 – 2014-08-22 (×6): 5 mL via ORAL
  Filled 2014-08-18 (×7): qty 5

## 2014-08-18 NOTE — Plan of Care (Signed)
Problem: Phase I Progression Outcomes Goal: First antibiotic given within 6hrs of admit Outcome: Completed/Met Date Met:  08/18/14 Goal: Confirm chest x-ray completed Outcome: Completed/Met Date Met:  08/18/14

## 2014-08-18 NOTE — Progress Notes (Signed)
[facility]                                                                              Patient Demographics  Sharon Swanson, is a 53 y.o. female, DOB - October 13, 1960, WGY:[id number]  Admit date - 08/17/2014   Admitting Physician Bonnielee Haff, MD  Outpatient Primary MD for the patient is No primary care provider on file.  LOS - 1   Chief Complaint  Patient presents with  . Cough  . Generalized Body Aches  . Shortness of Breath      Interim history 53 year old female history of hypertension currently not on any medications as she states she ran out, that presented to the emergency department with complaints of shortness of breath and cough. Patient was found to have acute distress due failure upon admission with community-acquired pneumonia. Patient has been on azithromycin and ceftriaxone. Patient did have some lower extremities swelling, Doppler was negative for DVT.  Assessment & Plan   Acute respiratory failure with prolonged hypoxia -Continue Cytomel oxygen to maintain her saturations above 92% -Likely secondary to community acquired pneumonia, complicated by smoking -Continue steroids and nebulizer treatments  Community-acquired pneumonia -Continue azithromycin and ceftriaxone, tussionex and robitussin PRN -Urine strep pneumonia negative.  Pending legionella urine Ag -Influenza PCR negative -HIV nonreactive  Left pulmonary nodule -Patient does have a long-standing history of smoking -Seen on CT scan -Recommended repeat CT of the chest within 4-6 months  Umbilical hernia -Worsened with coughing episodes -Abdominal x-ray: no active abdominal findings  Lower extremity edema -Patient was given IV fluids in the emergency department -Lower extremity Doppler was negative for DVT -Echocardiogram pending  Tobacco abuse -Continue nicotine patch -Smoking cessation counseling given  Morbid obesity, BMI 47 -Patient will need to followup with her PCP for dietary  and lifestyle modifications upon discharge  GERD -Continue PPI  Code Status: Full  Family Communication: None at bedside  Disposition Plan: Admitted  Time Spent in minutes   30 minutes  Procedures  None  Consults   None  DVT Prophylaxis  Lovenox  Lab Results  Component Value Date   PLT 166 08/18/2014    Medications  Scheduled Meds: . albuterol  2.5 mg Nebulization Q6H  . azithromycin  500 mg Intravenous Q24H  . cefTRIAXone (ROCEPHIN)  IV  1 g Intravenous Q24H  . enoxaparin (LOVENOX) injection  0.5 mg/kg Subcutaneous Q24H  . methylPREDNISolone (SOLU-MEDROL) injection  60 mg Intravenous 4 times per day  . nicotine  14 mg Transdermal Daily  . pantoprazole  40 mg Oral Q1200  . sodium chloride  3 mL Intravenous Q12H   Continuous Infusions: . sodium chloride 10 mL/hr at 08/17/14 1808   PRN Meds:.acetaminophen **OR** acetaminophen, albuterol, alum & mag hydroxide-simeth, guaifenesin, HYDROcodone-acetaminophen, morphine injection, ondansetron **OR** ondansetron (ZOFRAN) IV  Antibiotics    Anti-infectives    Start     Dose/Rate Route Frequency Ordered Stop   08/18/14 1000  cefTRIAXone (ROCEPHIN) 1 g in dextrose 5 % 50 mL IVPB - Premix     1 g100 mL/hr over 30 Minutes Intravenous Every 24 hours 08/17/14 1341 08/25/14 0959   08/18/14 1000  azithromycin (ZITHROMAX) 500 mg in dextrose 5 %  250 mL IVPB     500 mg250 mL/hr over 60 Minutes Intravenous Every 24 hours 08/17/14 1341 08/25/14 0959   08/17/14 0930  cefTRIAXone (ROCEPHIN) 1 g in dextrose 5 % 50 mL IVPB     1 g100 mL/hr over 30 Minutes Intravenous  Once 08/17/14 0925 08/17/14 1014   08/17/14 0930  azithromycin (ZITHROMAX) 500 mg in dextrose 5 % 250 mL IVPB     500 mg250 mL/hr over 60 Minutes Intravenous  Once 08/17/14 0925 08/17/14 1137        Subjective:   [name] seen and examined today.  Patient continues to feel short of breath. She continues to have cough along with pain with her cough. Patient  denies any chest pain at this time any abdominal pain, nausea, vomiting.  Objective:   Filed Vitals:   08/17/14 2048 08/18/14 0102 08/18/14 0503 08/18/14 0824  BP: 170/86  157/84   Pulse: 108  97   Temp: 98.1 F (36.7 C)  97.9 F (36.6 C)   TempSrc: Oral  Oral   Resp: 18  18   Height:      Weight:      SpO2: 95% 95% 96% 95%    Wt Readings from Last 3 Encounters:  08/17/14 133.403 kg (294 lb 1.6 oz)  08/15/12 128.878 kg (284 lb 2 oz)  07/18/12 130.182 kg (287 lb)     Intake/Output Summary (Last 24 hours) at 08/18/14 1111 Last data filed at 08/18/14 0900  Gross per 24 hour  Intake    720 ml  Output   1650 ml  Net   -930 ml    Exam  General: Well developed, well nourished, NAD, appears stated age  HEENT: NCAT, PERRLA, EOMI, Anicteic Sclera, mucous membranes moist.   Neck: Supple, no JVD, no masses  Cardiovascular: S1 S2 auscultated, no rubs, murmurs or gallops. Regular rate and rhythm.  Respiratory: Diffuse expiratory wheezing noted in all lung fields, some crackles appreciated at the bases  Abdomen: Soft, obese nontender, nondistended, + bowel sounds, + umbilical hernia  Extremities: warm dry without cyanosis clubbing.  Trace lower extremity edema  Neuro: AAOx3, cranial nerves grossly intact. Strength 5/5 in patient's upper and lower extremities bilaterally  Skin: Without rashes exudates or nodules  Psych: Normal affect and demeanor with intact judgement and insight  Data Review   Micro Results No results found for this or any previous visit (from the past 240 hour(s)).  Radiology Reports Dg Chest 2 View  08/17/2014   CLINICAL DATA:  Cough and congestion for 3 days; shortness of breath  EXAM: CHEST  2 VIEW  COMPARISON:  September 24, 2013  FINDINGS: There is infiltrate in the axillary subsegment of the right upper lobe. In addition, there is a nodular opacity in the left mid lung measuring 1.5 x 0.9 cm. Lungs elsewhere clear. Heart size and pulmonary  vascularity are normal. No adenopathy. No bone lesions. There is diffuse idiopathic skeletal hyperostosis in the thoracic region.  IMPRESSION: 1.5 x 0.9 cm nodular opacity left mid lung. Advise noncontrast enhanced chest CT to further assess this lesion.  Infiltrate in the axillary subsegment of the right upper lobe.  Diffuse idiopathic skeletal hyperostosis in the thoracic spine.   Electronically Signed   By: Lowella Grip M.D.   On: 08/17/2014 09:18   Ct Chest Wo Contrast  08/17/2014   CLINICAL DATA:  53 year old female with cough, shortness of breath, fever and wheezing. Pulmonary nodule noted on chest x-ray obtained earlier  today  EXAM: CT CHEST WITHOUT CONTRAST  TECHNIQUE: Multidetector CT imaging of the chest was performed following the standard protocol without IV contrast.  COMPARISON:  Chest x-ray obtained earlier today at 9:06 a.m.; prior CT scan of the chest 10/21/2011  FINDINGS: Mediastinum: Unremarkable CT appearance of the thyroid gland. No suspicious mediastinal or hilar adenopathy. A 5 mm in short axis prevascular mediastinal node(image 25 series 2) is favored to be reactive. No soft tissue mediastinal mass. The thoracic esophagus is unremarkable.  Heart/Vascular: Limited evaluation in the absence of intravenous contrast. No aneurysmal dilatation. Heart remains within normal limits for size. No pericardial effusion.  Lungs/Pleura: No pleural effusion. Scattered patchy ground-glass attenuation opacity and a peribronchovascular distribution involving both upper and lower lungs. A somewhat nodular region of consolidation in the superior segment of the left lower lobe likely corresponds with the questioned pulmonary nodule on the prior chest x-ray. The background pulmonary parenchyma appears to be within normal limits. No generalized bronchial wall thickening or emphysematous change.  Bones/Soft Tissues: No acute fracture or aggressive appearing lytic or blastic osseous lesion. Contiguous flowing  anterior osteophytes consistent with dystrophic radiographic skeletal hyperostosis.  Upper Abdomen: Hepatic steatosis. Otherwise, visualized upper abdominal organs are unremarkable.  IMPRESSION: 1. Multifocal patchy ground-glass attenuation opacities in a peribronchovascular distribution with regions of more focal consolidation and tree-in-bud micro nodularity involving both upper and lower lobes. Overall, these findings are most consistent with a multi lobar bronchopneumonia. Other multifocal infectious or inflammatory processes are considered less likely. 2. Somewhat nodular region of consolidation in the superior segment of the left lower lobe corresponds with the questioned pulmonary nodule on the prior chest x-ray. Consider followup chest CT in 4- 6 weeks following appropriate course of therapy to confirm resolution. 3. Hepatic steatosis.   Electronically Signed   By: Jacqulynn Cadet M.D.   On: 08/17/2014 14:53   Dg Abd 2 Views  08/17/2014   CLINICAL DATA:  Nausea, vomiting and diarrhea.  Initial encounter.  EXAM: ABDOMEN - 2 VIEW  COMPARISON:  CT 06/01/2012, ultrasound 06/01/2012 and radiographs 12/01/2008.  FINDINGS: The bowel gas pattern is normal. There is no free intraperitoneal air or suspicious abdominal calcification. Mild degenerative changes throughout the spine are stable.  IMPRESSION: No active abdominal findings.  Normal bowel gas pattern.   Electronically Signed   By: Camie Patience M.D.   On: 08/17/2014 14:43    CBC  Recent Labs Lab 08/17/14 0850 08/18/14 0500  WBC 4.5 4.4  HGB 15.6* 15.9*  HCT 48.0* 49.5*  PLT 157 166  MCV 89.4 90.0  MCH 29.1 28.9  MCHC 32.5 32.1  RDW 14.7 14.7  LYMPHSABS 0.7  --   MONOABS 0.4  --   EOSABS 0.0  --   BASOSABS 0.0  --     Chemistries   Recent Labs Lab 08/17/14 0850 08/18/14 0500  NA 135* 138  K 4.4 4.6  CL 95* 98  CO2 26 26  GLUCOSE 103* 188*  BUN 8 12  CREATININE 0.63 0.54  CALCIUM 9.3 9.6  AST  --  29  ALT  --  55*    ALKPHOS  --  67  BILITOT  --  0.2*   ------------------------------------------------------------------------------------------------------------------ estimated creatinine clearance is 114.1 mL/min (by C-G formula based on Cr of 0.54). ------------------------------------------------------------------------------------------------------------------ No results for input(s): HGBA1C in the last 72 hours. ------------------------------------------------------------------------------------------------------------------ No results for input(s): CHOL, HDL, LDLCALC, TRIG, CHOLHDL, LDLDIRECT in the last 72 hours. ------------------------------------------------------------------------------------------------------------------  Recent Labs  08/17/14 1410  TSH 0.491   ------------------------------------------------------------------------------------------------------------------ No results for input(s): VITAMINB12, FOLATE, FERRITIN, TIBC, IRON, RETICCTPCT in the last 72 hours.  Coagulation profile No results for input(s): INR, PROTIME in the last 168 hours.  No results for input(s): DDIMER in the last 72 hours.  Cardiac Enzymes No results for input(s): CKMB, TROPONINI, MYOGLOBIN in the last 168 hours.  Invalid input(s): CK ------------------------------------------------------------------------------------------------------------------ Invalid input(s): POCBNP    Aubriana Ravelo D.O. on 08/18/2014 at 11:11 AM  Between 7am to 7pm - Pager - [phone]  After 7pm go to [email or web address] - password [email or web address]  And look for the night coverage person covering for me after hours  [facility] Office  [phone]

## 2014-08-19 DIAGNOSIS — I517 Cardiomegaly: Secondary | ICD-10-CM

## 2014-08-19 LAB — LEGIONELLA ANTIGEN, URINE

## 2014-08-19 MED ORDER — CLONIDINE HCL 0.1 MG PO TABS
0.1000 mg | ORAL_TABLET | Freq: Two times a day (BID) | ORAL | Status: DC
  Administered 2014-08-19 – 2014-08-21 (×5): 0.1 mg via ORAL
  Filled 2014-08-19 (×6): qty 1

## 2014-08-19 MED ORDER — AMLODIPINE BESYLATE 10 MG PO TABS
10.0000 mg | ORAL_TABLET | Freq: Every day | ORAL | Status: DC
  Administered 2014-08-19 – 2014-08-22 (×4): 10 mg via ORAL
  Filled 2014-08-19 (×4): qty 1

## 2014-08-19 MED ORDER — HYDRALAZINE HCL 20 MG/ML IJ SOLN
10.0000 mg | Freq: Once | INTRAMUSCULAR | Status: AC
  Administered 2014-08-19: 10 mg via INTRAVENOUS
  Filled 2014-08-19: qty 1

## 2014-08-19 NOTE — Plan of Care (Signed)
Problem: Phase I Progression Outcomes Goal: Code status addressed with pt/family Outcome: Completed/Met Date Met:  08/19/14 Goal: Initial discharge plan identified Outcome: Completed/Met Date Met:  08/19/14  Problem: Phase II Progression Outcomes Goal: Encourage coughing & deep breathing Outcome: Completed/Met Date Met:  08/19/14 Goal: Discharge plan established Outcome: Completed/Met Date Met:  08/19/14 Goal: Tolerating diet Outcome: Completed/Met Date Met:  08/19/14  Problem: Phase III Progression Outcomes Goal: Activity at appropriate level-compared to baseline (UP IN CHAIR FOR HEMODIALYSIS)  Outcome: Completed/Met Date Met:  08/19/14

## 2014-08-19 NOTE — Plan of Care (Signed)
Problem: Phase I Progression Outcomes Goal: OOB as tolerated unless otherwise ordered Outcome: Completed/Met Date Met:  08/19/14  Problem: Phase II Progression Outcomes Goal: Pain controlled Outcome: Completed/Met Date Met:  08/19/14

## 2014-08-19 NOTE — Progress Notes (Signed)
  Echocardiogram 2D Echocardiogram has been performed.  Sharon Swanson 08/19/2014, 9:53 AM

## 2014-08-19 NOTE — Progress Notes (Signed)
[facility]                                                                              Patient Demographics  Sharon Swanson, is a 53 y.o. female, DOB - 1961-03-06, IEP:[id number]  Admit date - 08/17/2014   Admitting Physician Bonnielee Haff, MD  Outpatient Primary MD for the patient is No primary care provider on file.  LOS - 2   Chief Complaint  Patient presents with  . Cough  . Generalized Body Aches  . Shortness of Breath      Interim history 53 year old female history of hypertension currently not on any medications as she states she ran out, that presented to the emergency department with complaints of shortness of breath and cough. Patient was found to have acute distress due failure upon admission with community-acquired pneumonia. Patient has been on azithromycin and ceftriaxone. Patient did have some lower extremities swelling, Doppler was negative for DVT.  Assessment & Plan   Acute respiratory failure with prolonged hypoxia -Continue Cytomel oxygen to maintain her saturations above 92% -Likely secondary to community acquired pneumonia, complicated by smoking -Continue steroids and nebulizer treatments  Community-acquired pneumonia -Continue azithromycin and ceftriaxone, tussionex and robitussin PRN -Urine strep pneumonia negative.  Pending legionella urine Ag -Influenza PCR negative -HIV nonreactive  Left pulmonary nodule -Patient does have a long-standing history of smoking -Seen on CT scan -Recommended repeat CT of the chest within 4-6 months  Umbilical hernia -Worsened with coughing episodes -Abdominal x-ray: no active abdominal findings  Lower extremity edema  -Lower extremity Doppler was negative for DVT -Echocardiogram doen results pending.  Tobacco abuse -Continue nicotine patch -Smoking cessation counseling given  Morbid obesity, BMI 47 -Patient will need to followup with her PCP for dietary and lifestyle modifications upon  discharge  GERD -Continue PPI   Accelerated hypertension: - resume home meds.   Code Status: Full  Family Communication: None at bedside  Disposition Plan: Admitted  Time Spent in minutes   30 minutes  Procedures  None  Consults   None  DVT Prophylaxis  Lovenox  Lab Results  Component Value Date   PLT 166 08/18/2014    Medications  Scheduled Meds: . albuterol  2.5 mg Nebulization Q6H  . amLODipine  10 mg Oral Daily  . azithromycin  500 mg Intravenous Q24H  . cefTRIAXone (ROCEPHIN)  IV  1 g Intravenous Q24H  . cloNIDine  0.1 mg Oral BID  . enoxaparin (LOVENOX) injection  0.5 mg/kg Subcutaneous Q24H  . methylPREDNISolone (SOLU-MEDROL) injection  60 mg Intravenous 4 times per day  . nicotine  14 mg Transdermal Daily  . pantoprazole  40 mg Oral Q1200  . sodium chloride  3 mL Intravenous Q12H   Continuous Infusions: . sodium chloride 10 mL/hr at 08/17/14 1808   PRN Meds:.acetaminophen **OR** acetaminophen, albuterol, alum & mag hydroxide-simeth, chlorpheniramine-HYDROcodone, guaifenesin, HYDROcodone-acetaminophen, morphine injection, ondansetron **OR** ondansetron (ZOFRAN) IV  Antibiotics    Anti-infectives    Start     Dose/Rate Route Frequency Ordered Stop   08/18/14 1000  cefTRIAXone (ROCEPHIN) 1 g in dextrose 5 % 50 mL IVPB - Premix     1 g100 mL/hr over 30 Minutes Intravenous  Every 24 hours 08/17/14 1341 08/25/14 0959   08/18/14 1000  azithromycin (ZITHROMAX) 500 mg in dextrose 5 % 250 mL IVPB     500 mg250 mL/hr over 60 Minutes Intravenous Every 24 hours 08/17/14 1341 08/25/14 0959   08/17/14 0930  cefTRIAXone (ROCEPHIN) 1 g in dextrose 5 % 50 mL IVPB     1 g100 mL/hr over 30 Minutes Intravenous  Once 08/17/14 0925 08/17/14 1014   08/17/14 0930  azithromycin (ZITHROMAX) 500 mg in dextrose 5 % 250 mL IVPB     500 mg250 mL/hr over 60 Minutes Intravenous  Once 08/17/14 0925 08/17/14 1137        Subjective:   Sharon Swanson seen and examined today.   Patient continues to feel short of breath. She continues to have cough along with pain with her cough. Patient denies any chest pain at this time any abdominal pain, nausea, vomiting.  Objective:   Filed Vitals:   08/19/14 0800 08/19/14 1005 08/19/14 1412 08/19/14 1548  BP:  173/91 166/104   Pulse:  111 108   Temp:  98 F (36.7 C) 98.8 F (37.1 C)   TempSrc:  Oral Oral   Resp:  20 20   Height:      Weight:      SpO2: 96%  95% 96%    Wt Readings from Last 3 Encounters:  08/17/14 133.403 kg (294 lb 1.6 oz)  08/15/12 128.878 kg (284 lb 2 oz)  07/18/12 130.182 kg (287 lb)     Intake/Output Summary (Last 24 hours) at 08/19/14 1734 Last data filed at 08/18/14 2049  Gross per 24 hour  Intake    603 ml  Output      0 ml  Net    603 ml    Exam  General: Well developed, well nourished, NAD, appears stated age  HEENT: NCAT, PERRLA, EOMI, Anicteic Sclera, mucous membranes moist.   Neck: Supple, no JVD, no masses  Cardiovascular: S1 S2 auscultated, no rubs, murmurs or gallops. Regular rate and rhythm.  Respiratory: Diffuse expiratory wheezing noted in all lung fields, some crackles appreciated at the bases  Abdomen: Soft, obese nontender, nondistended, + bowel sounds, + umbilical hernia  Extremities: warm dry without cyanosis clubbing.  Trace lower extremity edema  Neuro: AAOx3, cranial nerves grossly intact. Strength 5/5 in patient's upper and lower extremities bilaterally  Skin: Without rashes exudates or nodules  Psych: Normal affect and demeanor with intact judgement and insight  Data Review   Micro Results No results found for this or any previous visit (from the past 240 hour(s)).  Radiology Reports Dg Chest 2 View  08/17/2014   CLINICAL DATA:  Cough and congestion for 3 days; shortness of breath  EXAM: CHEST  2 VIEW  COMPARISON:  September 24, 2013  FINDINGS: There is infiltrate in the axillary subsegment of the right upper lobe. In addition, there is a nodular  opacity in the left mid lung measuring 1.5 x 0.9 cm. Lungs elsewhere clear. Heart size and pulmonary vascularity are normal. No adenopathy. No bone lesions. There is diffuse idiopathic skeletal hyperostosis in the thoracic region.  IMPRESSION: 1.5 x 0.9 cm nodular opacity left mid lung. Advise noncontrast enhanced chest CT to further assess this lesion.  Infiltrate in the axillary subsegment of the right upper lobe.  Diffuse idiopathic skeletal hyperostosis in the thoracic spine.   Electronically Signed   By: Lowella Grip M.D.   On: 08/17/2014 09:18   Ct Chest Wo Contrast  08/17/2014   CLINICAL DATA:  53 year old female with cough, shortness of breath, fever and wheezing. Pulmonary nodule noted on chest x-ray obtained earlier today  EXAM: CT CHEST WITHOUT CONTRAST  TECHNIQUE: Multidetector CT imaging of the chest was performed following the standard protocol without IV contrast.  COMPARISON:  Chest x-ray obtained earlier today at 9:06 a.m.; prior CT scan of the chest 10/21/2011  FINDINGS: Mediastinum: Unremarkable CT appearance of the thyroid gland. No suspicious mediastinal or hilar adenopathy. A 5 mm in short axis prevascular mediastinal node(image 25 series 2) is favored to be reactive. No soft tissue mediastinal mass. The thoracic esophagus is unremarkable.  Heart/Vascular: Limited evaluation in the absence of intravenous contrast. No aneurysmal dilatation. Heart remains within normal limits for size. No pericardial effusion.  Lungs/Pleura: No pleural effusion. Scattered patchy ground-glass attenuation opacity and a peribronchovascular distribution involving both upper and lower lungs. A somewhat nodular region of consolidation in the superior segment of the left lower lobe likely corresponds with the questioned pulmonary nodule on the prior chest x-ray. The background pulmonary parenchyma appears to be within normal limits. No generalized bronchial wall thickening or emphysematous change.  Bones/Soft  Tissues: No acute fracture or aggressive appearing lytic or blastic osseous lesion. Contiguous flowing anterior osteophytes consistent with dystrophic radiographic skeletal hyperostosis.  Upper Abdomen: Hepatic steatosis. Otherwise, visualized upper abdominal organs are unremarkable.  IMPRESSION: 1. Multifocal patchy ground-glass attenuation opacities in a peribronchovascular distribution with regions of more focal consolidation and tree-in-bud micro nodularity involving both upper and lower lobes. Overall, these findings are most consistent with a multi lobar bronchopneumonia. Other multifocal infectious or inflammatory processes are considered less likely. 2. Somewhat nodular region of consolidation in the superior segment of the left lower lobe corresponds with the questioned pulmonary nodule on the prior chest x-ray. Consider followup chest CT in 4- 6 weeks following appropriate course of therapy to confirm resolution. 3. Hepatic steatosis.   Electronically Signed   By: Jacqulynn Cadet M.D.   On: 08/17/2014 14:53   Dg Abd 2 Views  08/17/2014   CLINICAL DATA:  Nausea, vomiting and diarrhea.  Initial encounter.  EXAM: ABDOMEN - 2 VIEW  COMPARISON:  CT 06/01/2012, ultrasound 06/01/2012 and radiographs 12/01/2008.  FINDINGS: The bowel gas pattern is normal. There is no free intraperitoneal air or suspicious abdominal calcification. Mild degenerative changes throughout the spine are stable.  IMPRESSION: No active abdominal findings.  Normal bowel gas pattern.   Electronically Signed   By: Camie Patience M.D.   On: 08/17/2014 14:43    CBC  Recent Labs Lab 08/17/14 0850 08/18/14 0500  WBC 4.5 4.4  HGB 15.6* 15.9*  HCT 48.0* 49.5*  PLT 157 166  MCV 89.4 90.0  MCH 29.1 28.9  MCHC 32.5 32.1  RDW 14.7 14.7  LYMPHSABS 0.7  --   MONOABS 0.4  --   EOSABS 0.0  --   BASOSABS 0.0  --     Chemistries   Recent Labs Lab 08/17/14 0850 08/18/14 0500  NA 135* 138  K 4.4 4.6  CL 95* 98  CO2 26 26   GLUCOSE 103* 188*  BUN 8 12  CREATININE 0.63 0.54  CALCIUM 9.3 9.6  AST  --  29  ALT  --  55*  ALKPHOS  --  67  BILITOT  --  0.2*   ------------------------------------------------------------------------------------------------------------------ estimated creatinine clearance is 114.1 mL/min (by C-G formula based on Cr of 0.54). ------------------------------------------------------------------------------------------------------------------ No results for input(s): HGBA1C in the last 72 hours. ------------------------------------------------------------------------------------------------------------------  No results for input(s): CHOL, HDL, LDLCALC, TRIG, CHOLHDL, LDLDIRECT in the last 72 hours. ------------------------------------------------------------------------------------------------------------------  Recent Labs  08/17/14 1410  TSH 0.491   ------------------------------------------------------------------------------------------------------------------ No results for input(s): VITAMINB12, FOLATE, FERRITIN, TIBC, IRON, RETICCTPCT in the last 72 hours.  Coagulation profile No results for input(s): INR, PROTIME in the last 168 hours.  No results for input(s): DDIMER in the last 72 hours.  Cardiac Enzymes No results for input(s): CKMB, TROPONINI, MYOGLOBIN in the last 168 hours.  Invalid input(s): CK ------------------------------------------------------------------------------------------------------------------ Invalid input(s): POCBNP    Sharon Swanson,VIJAYA11/25/2015 at 5:34 PM  Between 7am to 7pm - Pager - [phone]  After 7pm go to [email or web address] - password [email or web address]  And look for the night coverage person covering for me after hours  [facility] Office  [phone]

## 2014-08-20 LAB — BASIC METABOLIC PANEL
Anion gap: 13 (ref 5–15)
BUN: 19 mg/dL (ref 6–23)
CO2: 27 mEq/L (ref 19–32)
Calcium: 9.4 mg/dL (ref 8.4–10.5)
Chloride: 92 mEq/L — ABNORMAL LOW (ref 96–112)
Creatinine, Ser: 0.54 mg/dL (ref 0.50–1.10)
GFR calc Af Amer: >90 mL/min (ref >90)
Glucose, Bld: 293 mg/dL — ABNORMAL HIGH (ref 70–99)
Potassium: 4.8 mEq/L (ref 3.7–5.3)
Sodium: 132 mEq/L — ABNORMAL LOW (ref 137–147)

## 2014-08-20 LAB — HEMOGLOBIN A1C
Hgb A1c MFr Bld: 6.7 % — ABNORMAL HIGH (ref <5.7)
MEAN PLASMA GLUCOSE: 146 mg/dL — AB (ref <117)

## 2014-08-20 MED ORDER — IPRATROPIUM-ALBUTEROL 0.5-2.5 (3) MG/3ML IN SOLN
3.0000 mL | Freq: Four times a day (QID) | RESPIRATORY_TRACT | Status: DC
  Administered 2014-08-20 – 2014-08-22 (×8): 3 mL via RESPIRATORY_TRACT
  Filled 2014-08-20 (×9): qty 3

## 2014-08-20 MED ORDER — FUROSEMIDE 10 MG/ML IJ SOLN
20.0000 mg | Freq: Two times a day (BID) | INTRAMUSCULAR | Status: DC
  Administered 2014-08-20 – 2014-08-22 (×5): 20 mg via INTRAVENOUS
  Filled 2014-08-20 (×5): qty 2

## 2014-08-20 MED ORDER — ALPRAZOLAM 0.25 MG PO TABS
0.2500 mg | ORAL_TABLET | Freq: Three times a day (TID) | ORAL | Status: DC | PRN
  Administered 2014-08-20: 0.25 mg via ORAL
  Filled 2014-08-20 (×2): qty 1

## 2014-08-20 NOTE — Progress Notes (Signed)
[facility]                                                                              Patient Demographics  Sharon Swanson, is a 53 y.o. female, DOB - [birth year], IRJ:[id number]  Admit date - 08/17/2014   Admitting Physician Bonnielee Haff, MD  Outpatient Primary MD for the patient is No primary care provider on file.  LOS - 3   Chief Complaint  Patient presents with  . Cough  . Generalized Body Aches  . Shortness of Breath      Interim history 53 year old female history of hypertension currently not on any medications as she states she ran out, that presented to the emergency department with complaints of shortness of breath and cough. Patient was found to have acute distress due failure upon admission with community-acquired pneumonia. Patient has been on azithromycin and ceftriaxone. Patient did have some lower extremities swelling, Doppler was negative for DVT.  Assessment & Plan   Acute respiratory failure with prolonged hypoxia -Continue nasal oxygen to maintain her saturations above 92% -Likely secondary to community acquired pneumonia, complicated by smoking -Continue steroids and nebulizer treatments  Community-acquired pneumonia -Continue azithromycin and ceftriaxone, tussionex and robitussin PRN -Urine strep pneumonia negative.  Negative, legionella urine Ag -Influenza PCR negative -HIV nonreactive  Left pulmonary nodule -Patient does have a long-standing history of smoking -Seen on CT scan -Recommended repeat CT of the chest within 4-6 months  Umbilical hernia -Worsened with coughing episodes -Abdominal x-ray: no active abdominal findings  Lower extremity edema  -Lower extremity Doppler was negative for DVT -Echocardiogram done, showed grade 2 diastolic dysfunction. Started on low dose lasix.  Tobacco abuse -Continue nicotine patch -Smoking cessation counseling given  Morbid obesity, BMI 47 -Patient will need to followup with her PCP for  dietary and lifestyle modifications upon discharge  GERD -Continue PPI   Accelerated hypertension: - resume home meds. Improving.   Code Status: Full  Family Communication: None at bedside  Disposition Plan: Admitted  Time Spent in minutes   30 minutes  Procedures  None  Consults   None  DVT Prophylaxis  Lovenox  Lab Results  Component Value Date   PLT 166 08/18/2014    Medications  Scheduled Meds: . amLODipine  10 mg Oral Daily  . azithromycin  500 mg Intravenous Q24H  . cefTRIAXone (ROCEPHIN)  IV  1 g Intravenous Q24H  . cloNIDine  0.1 mg Oral BID  . enoxaparin (LOVENOX) injection  0.5 mg/kg Subcutaneous Q24H  . furosemide  20 mg Intravenous BID  . ipratropium-albuterol  3 mL Nebulization Q6H  . methylPREDNISolone (SOLU-MEDROL) injection  60 mg Intravenous 4 times per day  . nicotine  14 mg Transdermal Daily  . pantoprazole  40 mg Oral Q1200  . sodium chloride  3 mL Intravenous Q12H   Continuous Infusions: . sodium chloride 10 mL/hr at 08/17/14 1808   PRN Meds:.acetaminophen **OR** acetaminophen, albuterol, ALPRAZolam, alum & mag hydroxide-simeth, chlorpheniramine-HYDROcodone, guaifenesin, HYDROcodone-acetaminophen, morphine injection, ondansetron **OR** ondansetron (ZOFRAN) IV  Antibiotics    Anti-infectives    Start     Dose/Rate Route Frequency Ordered Stop   08/18/14 1000  cefTRIAXone (ROCEPHIN) 1 g in dextrose  5 % 50 mL IVPB - Premix     1 g100 mL/hr over 30 Minutes Intravenous Every 24 hours 08/17/14 1341 08/25/14 0959   08/18/14 1000  azithromycin (ZITHROMAX) 500 mg in dextrose 5 % 250 mL IVPB     500 mg250 mL/hr over 60 Minutes Intravenous Every 24 hours 08/17/14 1341 08/25/14 0959   08/17/14 0930  cefTRIAXone (ROCEPHIN) 1 g in dextrose 5 % 50 mL IVPB     1 g100 mL/hr over 30 Minutes Intravenous  Once 08/17/14 0925 08/17/14 1014   08/17/14 0930  azithromycin (ZITHROMAX) 500 mg in dextrose 5 % 250 mL IVPB     500 mg250 mL/hr over 60 Minutes  Intravenous  Once 08/17/14 0925 08/17/14 1137        Subjective:   [name] seen and examined today.  Patient continues to feel short of breath. She continues to have cough along with pain with her cough. Patient denies any chest pain at this time any abdominal pain, nausea, vomiting.  Objective:   Filed Vitals:   08/19/14 2118 08/20/14 0530 08/20/14 0848 08/20/14 1031  BP: 186/88 174/97    Pulse: 103 94    Temp: 97.8 F (36.6 C) 97.8 F (36.6 C)    TempSrc: Oral Oral    Resp: 20 20    Height:      Weight:      SpO2: 91% 94% 91% 96%    Wt Readings from Last 3 Encounters:  08/17/14 133.403 kg (294 lb 1.6 oz)  08/15/12 128.878 kg (284 lb 2 oz)  07/18/12 130.182 kg (287 lb)     Intake/Output Summary (Last 24 hours) at 08/20/14 1458 Last data filed at 08/20/14 1010  Gross per 24 hour  Intake    366 ml  Output    300 ml  Net     66 ml    Exam  General: Well developed, well nourished, NAD, appears stated age  HEENT: NCAT, PERRLA, EOMI, Anicteic Sclera, mucous membranes moist.   Neck: Supple, no JVD, no masses  Cardiovascular: S1 S2 auscultated, no rubs, murmurs or gallops. Regular rate and rhythm.  Respiratory: Diffuse expiratory wheezing noted in all lung fields, some crackles appreciated at the bases  Abdomen: Soft, obese nontender, nondistended, + bowel sounds, + umbilical hernia  Extremities: warm dry without cyanosis clubbing.  Trace lower extremity edema  Neuro: AAOx3, cranial nerves grossly intact. Strength 5/5 in patient's upper and lower extremities bilaterally  Skin: Without rashes exudates or nodules  Psych: Normal affect and demeanor with intact judgement and insight  Data Review   Micro Results No results found for this or any previous visit (from the past 240 hour(s)).  Radiology Reports Dg Chest 2 View  08/17/2014   CLINICAL DATA:  Cough and congestion for 3 days; shortness of breath  EXAM: CHEST  2 VIEW  COMPARISON:  September 24, 2013  FINDINGS: There is infiltrate in the axillary subsegment of the right upper lobe. In addition, there is a nodular opacity in the left mid lung measuring 1.5 x 0.9 cm. Lungs elsewhere clear. Heart size and pulmonary vascularity are normal. No adenopathy. No bone lesions. There is diffuse idiopathic skeletal hyperostosis in the thoracic region.  IMPRESSION: 1.5 x 0.9 cm nodular opacity left mid lung. Advise noncontrast enhanced chest CT to further assess this lesion.  Infiltrate in the axillary subsegment of the right upper lobe.  Diffuse idiopathic skeletal hyperostosis in the thoracic spine.   Electronically Signed  By: Lowella Grip M.D.   On: 08/17/2014 09:18   Ct Chest Wo Contrast  08/17/2014   CLINICAL DATA:  52 year old female with cough, shortness of breath, fever and wheezing. Pulmonary nodule noted on chest x-ray obtained earlier today  EXAM: CT CHEST WITHOUT CONTRAST  TECHNIQUE: Multidetector CT imaging of the chest was performed following the standard protocol without IV contrast.  COMPARISON:  Chest x-ray obtained earlier today at 9:06 a.m.; prior CT scan of the chest 10/21/2011  FINDINGS: Mediastinum: Unremarkable CT appearance of the thyroid gland. No suspicious mediastinal or hilar adenopathy. A 5 mm in short axis prevascular mediastinal node(image 25 series 2) is favored to be reactive. No soft tissue mediastinal mass. The thoracic esophagus is unremarkable.  Heart/Vascular: Limited evaluation in the absence of intravenous contrast. No aneurysmal dilatation. Heart remains within normal limits for size. No pericardial effusion.  Lungs/Pleura: No pleural effusion. Scattered patchy ground-glass attenuation opacity and a peribronchovascular distribution involving both upper and lower lungs. A somewhat nodular region of consolidation in the superior segment of the left lower lobe likely corresponds with the questioned pulmonary nodule on the prior chest x-ray. The background pulmonary  parenchyma appears to be within normal limits. No generalized bronchial wall thickening or emphysematous change.  Bones/Soft Tissues: No acute fracture or aggressive appearing lytic or blastic osseous lesion. Contiguous flowing anterior osteophytes consistent with dystrophic radiographic skeletal hyperostosis.  Upper Abdomen: Hepatic steatosis. Otherwise, visualized upper abdominal organs are unremarkable.  IMPRESSION: 1. Multifocal patchy ground-glass attenuation opacities in a peribronchovascular distribution with regions of more focal consolidation and tree-in-bud micro nodularity involving both upper and lower lobes. Overall, these findings are most consistent with a multi lobar bronchopneumonia. Other multifocal infectious or inflammatory processes are considered less likely. 2. Somewhat nodular region of consolidation in the superior segment of the left lower lobe corresponds with the questioned pulmonary nodule on the prior chest x-ray. Consider followup chest CT in 4- 6 weeks following appropriate course of therapy to confirm resolution. 3. Hepatic steatosis.   Electronically Signed   By: Jacqulynn Cadet M.D.   On: 08/17/2014 14:53   Dg Abd 2 Views  08/17/2014   CLINICAL DATA:  Nausea, vomiting and diarrhea.  Initial encounter.  EXAM: ABDOMEN - 2 VIEW  COMPARISON:  CT 06/01/2012, ultrasound 06/01/2012 and radiographs 12/01/2008.  FINDINGS: The bowel gas pattern is normal. There is no free intraperitoneal air or suspicious abdominal calcification. Mild degenerative changes throughout the spine are stable.  IMPRESSION: No active abdominal findings.  Normal bowel gas pattern.   Electronically Signed   By: Camie Patience M.D.   On: 08/17/2014 14:43    CBC  Recent Labs Lab 08/17/14 0850 08/18/14 0500  WBC 4.5 4.4  HGB 15.6* 15.9*  HCT 48.0* 49.5*  PLT 157 166  MCV 89.4 90.0  MCH 29.1 28.9  MCHC 32.5 32.1  RDW 14.7 14.7  LYMPHSABS 0.7  --   MONOABS 0.4  --   EOSABS 0.0  --   BASOSABS 0.0   --     Chemistries   Recent Labs Lab 08/17/14 0850 08/18/14 0500 08/20/14 0931  NA 135* 138 132*  K 4.4 4.6 4.8  CL 95* 98 92*  CO2 26 26 27   GLUCOSE 103* 188* 293*  BUN 8 12 19   CREATININE 0.63 0.54 0.54  CALCIUM 9.3 9.6 9.4  AST  --  29  --   ALT  --  55*  --   ALKPHOS  --  67  --  BILITOT  --  0.2*  --    ------------------------------------------------------------------------------------------------------------------ estimated creatinine clearance is 114.1 mL/min (by C-G formula based on Cr of 0.54). ------------------------------------------------------------------------------------------------------------------ No results for input(s): HGBA1C in the last 72 hours. ------------------------------------------------------------------------------------------------------------------ No results for input(s): CHOL, HDL, LDLCALC, TRIG, CHOLHDL, LDLDIRECT in the last 72 hours. ------------------------------------------------------------------------------------------------------------------ No results for input(s): TSH, T4TOTAL, T3FREE, THYROIDAB in the last 72 hours.  Invalid input(s): FREET3 ------------------------------------------------------------------------------------------------------------------ No results for input(s): VITAMINB12, FOLATE, FERRITIN, TIBC, IRON, RETICCTPCT in the last 72 hours.  Coagulation profile No results for input(s): INR, PROTIME in the last 168 hours.  No results for input(s): DDIMER in the last 72 hours.  Cardiac Enzymes No results for input(s): CKMB, TROPONINI, MYOGLOBIN in the last 168 hours.  Invalid input(s): CK ------------------------------------------------------------------------------------------------------------------ Invalid input(s): POCBNP    Javone Ybanez,VIJAYA11/26/2015 at 2:58 PM  Between 7am to 7pm - Pager - [phone]  After 7pm go to [email or web address] - password [email or web address]  And look for the night coverage person covering for  me after hours  [facility] Office  [phone]

## 2014-08-20 NOTE — Progress Notes (Signed)
O2 @2L /min  ON @ rest  - O2Sats 97% OFF O2 at rest -  95% OFF O2  Ambulating - 87%

## 2014-08-20 NOTE — Progress Notes (Signed)
Went into pts room do give her 0000 medication, pt states she was seeing double. Neuro assessment performed and everything was WNL other than the double vision. Pt denies any numbness or weakness in any extremities. Spoke with NP on call, told to monitor pt and notify if any changes. Will continue to monitor.

## 2014-08-21 MED ORDER — PREDNISONE 50 MG PO TABS
60.0000 mg | ORAL_TABLET | Freq: Every day | ORAL | Status: DC
  Administered 2014-08-22: 60 mg via ORAL
  Filled 2014-08-21 (×2): qty 1

## 2014-08-21 MED ORDER — HYDRALAZINE HCL 50 MG PO TABS
50.0000 mg | ORAL_TABLET | Freq: Three times a day (TID) | ORAL | Status: DC
  Administered 2014-08-21 – 2014-08-22 (×2): 50 mg via ORAL
  Filled 2014-08-21 (×5): qty 1

## 2014-08-21 MED ORDER — CLONIDINE HCL 0.1 MG PO TABS
0.1000 mg | ORAL_TABLET | Freq: Three times a day (TID) | ORAL | Status: DC
  Administered 2014-08-21 – 2014-08-22 (×3): 0.1 mg via ORAL
  Filled 2014-08-21 (×5): qty 1

## 2014-08-21 MED ORDER — HYDRALAZINE HCL 25 MG PO TABS
25.0000 mg | ORAL_TABLET | Freq: Three times a day (TID) | ORAL | Status: DC
  Administered 2014-08-21: 25 mg via ORAL
  Filled 2014-08-21 (×3): qty 1

## 2014-08-21 NOTE — Progress Notes (Signed)
[facility]                                                                              Patient Demographics  Sharon Swanson, is a 53 y.o. female, DOB - Jul 22, 1961, GQB:[id number]  Admit date - 08/17/2014   Admitting Physician Bonnielee Haff, MD  Outpatient Primary MD for the patient is No primary care provider on file.  LOS - 4   Chief Complaint  Patient presents with  . Cough  . Generalized Body Aches  . Shortness of Breath      Interim history 53 year old female history of hypertension currently not on any medications as she states she ran out, that presented to the emergency department with complaints of shortness of breath and cough. Patient was found to have acute distress due failure upon admission with community-acquired pneumonia. Patient has been on azithromycin and ceftriaxone. Patient did have some lower extremities swelling, Doppler was negative for DVT.  Assessment & Plan   Acute respiratory failure with prolonged hypoxia -Continue nasal oxygen to maintain her saturations above 92% -Likely secondary to community acquired pneumonia, complicated by smoking -TRansition to po steroids and continue with nebulizer treatments  Community-acquired pneumonia -Continue azithromycin and ceftriaxone, tussionex and robitussin PRN -Urine strep pneumonia negative.  Negative, legionella urine Ag -Influenza PCR negative -HIV nonreactive  Left pulmonary nodule -Patient does have a long-standing history of smoking -Seen on CT scan -Recommended repeat CT of the chest within 4-6 months  Umbilical hernia -Worsened with coughing episodes -Abdominal x-ray: no active abdominal findings  Lower extremity edema  -Lower extremity Doppler was negative for DVT -Echocardiogram done, showed grade 2 diastolic dysfunction. Started on low dose lasix.  Tobacco abuse -Continue nicotine patch -Smoking cessation counseling given  Morbid obesity, BMI 47 -Patient will need to  followup with her PCP for dietary and lifestyle modifications upon discharge  GERD -Continue PPI   Accelerated hypertension: - resume home meds. Improving.   Code Status: Full  Family Communication: None at bedside  Disposition Plan: Admitted  Time Spent in minutes   30 minutes  Procedures  None  Consults   None  DVT Prophylaxis  Lovenox  Lab Results  Component Value Date   PLT 166 08/18/2014    Medications  Scheduled Meds: . amLODipine  10 mg Oral Daily  . azithromycin  500 mg Intravenous Q24H  . cefTRIAXone (ROCEPHIN)  IV  1 g Intravenous Q24H  . cloNIDine  0.1 mg Oral TID  . enoxaparin (LOVENOX) injection  0.5 mg/kg Subcutaneous Q24H  . furosemide  20 mg Intravenous BID  . hydrALAZINE  50 mg Oral 3 times per day  . ipratropium-albuterol  3 mL Nebulization Q6H  . nicotine  14 mg Transdermal Daily  . pantoprazole  40 mg Oral Q1200  . [START ON 08/22/2014] predniSONE  60 mg Oral Q breakfast  . sodium chloride  3 mL Intravenous Q12H   Continuous Infusions: . sodium chloride 10 mL/hr at 08/17/14 1808   PRN Meds:.acetaminophen **OR** acetaminophen, albuterol, ALPRAZolam, alum & mag hydroxide-simeth, chlorpheniramine-HYDROcodone, guaifenesin, HYDROcodone-acetaminophen, morphine injection, ondansetron **OR** ondansetron (ZOFRAN) IV  Antibiotics    Anti-infectives    Start     Dose/Rate Route  Frequency Ordered Stop   08/18/14 1000  cefTRIAXone (ROCEPHIN) 1 g in dextrose 5 % 50 mL IVPB - Premix     1 g100 mL/hr over 30 Minutes Intravenous Every 24 hours 08/17/14 1341 08/25/14 0959   08/18/14 1000  azithromycin (ZITHROMAX) 500 mg in dextrose 5 % 250 mL IVPB     500 mg250 mL/hr over 60 Minutes Intravenous Every 24 hours 08/17/14 1341 08/25/14 0959   08/17/14 0930  cefTRIAXone (ROCEPHIN) 1 g in dextrose 5 % 50 mL IVPB     1 g100 mL/hr over 30 Minutes Intravenous  Once 08/17/14 0925 08/17/14 1014   08/17/14 0930  azithromycin (ZITHROMAX) 500 mg in dextrose 5 % 250 mL  IVPB     500 mg250 mL/hr over 60 Minutes Intravenous  Once 08/17/14 0925 08/17/14 1137        Subjective:   [name] seen and examined today.  Patient continues to feel short of breath. She continues to have cough along with pain with her cough. Patient denies any chest pain at this time any abdominal pain, nausea, vomiting.  Objective:   Filed Vitals:   08/21/14 0444 08/21/14 0752 08/21/14 1228 08/21/14 1408  BP: 164/94  174/103 161/98  Pulse: 94  81 92  Temp: 97.8 F (36.6 C)   98.1 F (36.7 C)  TempSrc: Oral   Oral  Resp: 20   20  Height:      Weight: 130.273 kg (287 lb 3.2 oz)     SpO2: 94% 96% 95% 95%    Wt Readings from Last 3 Encounters:  08/21/14 130.273 kg (287 lb 3.2 oz)  08/15/12 128.878 kg (284 lb 2 oz)  07/18/12 130.182 kg (287 lb)     Intake/Output Summary (Last 24 hours) at 08/21/14 1848 Last data filed at 08/21/14 1400  Gross per 24 hour  Intake   1920 ml  Output    800 ml  Net   1120 ml    Exam  General: Well developed, well nourished, NAD, appears stated age  HEENT: NCAT, PERRLA, EOMI, Anicteic Sclera, mucous membranes moist.   Neck: Supple, no JVD, no masses  Cardiovascular: S1 S2 auscultated, no rubs, murmurs or gallops. Regular rate and rhythm.  Respiratory: Diffuse expiratory wheezing noted in all lung fields, some crackles appreciated at the bases  Abdomen: Soft, obese nontender, nondistended, + bowel sounds, + umbilical hernia  Extremities: warm dry without cyanosis clubbing.  Trace lower extremity edema  Neuro: AAOx3, cranial nerves grossly intact. Strength 5/5 in patient's upper and lower extremities bilaterally  Skin: Without rashes exudates or nodules  Psych: Normal affect and demeanor with intact judgement and insight  Data Review   Micro Results No results found for this or any previous visit (from the past 240 hour(s)).  Radiology Reports Dg Chest 2 View  08/17/2014   CLINICAL DATA:  Cough and congestion for  3 days; shortness of breath  EXAM: CHEST  2 VIEW  COMPARISON:  September 24, 2013  FINDINGS: There is infiltrate in the axillary subsegment of the right upper lobe. In addition, there is a nodular opacity in the left mid lung measuring 1.5 x 0.9 cm. Lungs elsewhere clear. Heart size and pulmonary vascularity are normal. No adenopathy. No bone lesions. There is diffuse idiopathic skeletal hyperostosis in the thoracic region.  IMPRESSION: 1.5 x 0.9 cm nodular opacity left mid lung. Advise noncontrast enhanced chest CT to further assess this lesion.  Infiltrate in the axillary subsegment of the right upper  lobe.  Diffuse idiopathic skeletal hyperostosis in the thoracic spine.   Electronically Signed   By: Lowella Grip M.D.   On: 08/17/2014 09:18   Ct Chest Wo Contrast  08/17/2014   CLINICAL DATA:  53 year old female with cough, shortness of breath, fever and wheezing. Pulmonary nodule noted on chest x-ray obtained earlier today  EXAM: CT CHEST WITHOUT CONTRAST  TECHNIQUE: Multidetector CT imaging of the chest was performed following the standard protocol without IV contrast.  COMPARISON:  Chest x-ray obtained earlier today at 9:06 a.m.; prior CT scan of the chest 10/21/2011  FINDINGS: Mediastinum: Unremarkable CT appearance of the thyroid gland. No suspicious mediastinal or hilar adenopathy. A 5 mm in short axis prevascular mediastinal node(image 25 series 2) is favored to be reactive. No soft tissue mediastinal mass. The thoracic esophagus is unremarkable.  Heart/Vascular: Limited evaluation in the absence of intravenous contrast. No aneurysmal dilatation. Heart remains within normal limits for size. No pericardial effusion.  Lungs/Pleura: No pleural effusion. Scattered patchy ground-glass attenuation opacity and a peribronchovascular distribution involving both upper and lower lungs. A somewhat nodular region of consolidation in the superior segment of the left lower lobe likely corresponds with the  questioned pulmonary nodule on the prior chest x-ray. The background pulmonary parenchyma appears to be within normal limits. No generalized bronchial wall thickening or emphysematous change.  Bones/Soft Tissues: No acute fracture or aggressive appearing lytic or blastic osseous lesion. Contiguous flowing anterior osteophytes consistent with dystrophic radiographic skeletal hyperostosis.  Upper Abdomen: Hepatic steatosis. Otherwise, visualized upper abdominal organs are unremarkable.  IMPRESSION: 1. Multifocal patchy ground-glass attenuation opacities in a peribronchovascular distribution with regions of more focal consolidation and tree-in-bud micro nodularity involving both upper and lower lobes. Overall, these findings are most consistent with a multi lobar bronchopneumonia. Other multifocal infectious or inflammatory processes are considered less likely. 2. Somewhat nodular region of consolidation in the superior segment of the left lower lobe corresponds with the questioned pulmonary nodule on the prior chest x-ray. Consider followup chest CT in 4- 6 weeks following appropriate course of therapy to confirm resolution. 3. Hepatic steatosis.   Electronically Signed   By: Jacqulynn Cadet M.D.   On: 08/17/2014 14:53   Dg Abd 2 Views  08/17/2014   CLINICAL DATA:  Nausea, vomiting and diarrhea.  Initial encounter.  EXAM: ABDOMEN - 2 VIEW  COMPARISON:  CT 06/01/2012, ultrasound 06/01/2012 and radiographs 12/01/2008.  FINDINGS: The bowel gas pattern is normal. There is no free intraperitoneal air or suspicious abdominal calcification. Mild degenerative changes throughout the spine are stable.  IMPRESSION: No active abdominal findings.  Normal bowel gas pattern.   Electronically Signed   By: Camie Patience M.D.   On: 08/17/2014 14:43    CBC  Recent Labs Lab 08/17/14 0850 08/18/14 0500  WBC 4.5 4.4  HGB 15.6* 15.9*  HCT 48.0* 49.5*  PLT 157 166  MCV 89.4 90.0  MCH 29.1 28.9  MCHC 32.5 32.1  RDW 14.7  14.7  LYMPHSABS 0.7  --   MONOABS 0.4  --   EOSABS 0.0  --   BASOSABS 0.0  --     Chemistries   Recent Labs Lab 08/17/14 0850 08/18/14 0500 08/20/14 0931  NA 135* 138 132*  K 4.4 4.6 4.8  CL 95* 98 92*  CO2 26 26 27   GLUCOSE 103* 188* 293*  BUN 8 12 19   CREATININE 0.63 0.54 0.54  CALCIUM 9.3 9.6 9.4  AST  --  29  --  ALT  --  55*  --   ALKPHOS  --  67  --   BILITOT  --  0.2*  --    ------------------------------------------------------------------------------------------------------------------ estimated creatinine clearance is 112.6 mL/min (by C-G formula based on Cr of 0.54). ------------------------------------------------------------------------------------------------------------------  Recent Labs  08/20/14 1017  HGBA1C 6.7*   ------------------------------------------------------------------------------------------------------------------ No results for input(s): CHOL, HDL, LDLCALC, TRIG, CHOLHDL, LDLDIRECT in the last 72 hours. ------------------------------------------------------------------------------------------------------------------ No results for input(s): TSH, T4TOTAL, T3FREE, THYROIDAB in the last 72 hours.  Invalid input(s): FREET3 ------------------------------------------------------------------------------------------------------------------ No results for input(s): VITAMINB12, FOLATE, FERRITIN, TIBC, IRON, RETICCTPCT in the last 72 hours.  Coagulation profile No results for input(s): INR, PROTIME in the last 168 hours.  No results for input(s): DDIMER in the last 72 hours.  Cardiac Enzymes No results for input(s): CKMB, TROPONINI, MYOGLOBIN in the last 168 hours.  Invalid input(s): CK ------------------------------------------------------------------------------------------------------------------ Invalid input(s): POCBNP    Quinn Quam,[id number]/27/2015 at 6:48 PM  Between 7am to 7pm - Pager - [phone]  After 7pm go to  [email or web address] - password [email or web address]  And look for the night coverage person covering for me after hours  [facility] Office  [phone]

## 2014-08-21 NOTE — Plan of Care (Signed)
Problem: Phase II Progression Outcomes Goal: Progress activity as tolerated unless otherwise ordered Outcome: Completed/Met Date Met:  08/21/14     

## 2014-08-21 NOTE — Plan of Care (Signed)
Problem: Phase I Progression Outcomes Goal: Dyspnea controlled at rest (HF) Outcome: Completed/Met Date Met:  08/21/14 Goal: Pain controlled with appropriate interventions Outcome: Completed/Met Date Met:  08/21/14 Goal: EF % per last Echo/documented,Core Reminder form on chart Outcome: Completed/Met Date Met:  08/21/14

## 2014-08-21 NOTE — Plan of Care (Signed)
Problem: Phase I Progression Outcomes Goal: Up in chair, BRP Outcome: Completed/Met Date Met:  08/21/14 Goal: Initial discharge plan identified Outcome: Completed/Met Date Met:  08/21/14 Goal: Voiding-avoid urinary catheter unless indicated Outcome: Completed/Met Date Met:  08/21/14 Goal: Hemodynamically stable Outcome: Completed/Met Date Met:  08/21/14

## 2014-08-22 ENCOUNTER — Inpatient Hospital Stay (HOSPITAL_COMMUNITY): Payer: BC Managed Care – PPO

## 2014-08-22 MED ORDER — OXYCODONE-ACETAMINOPHEN 5-325 MG PO TABS: 1.0000 | ORAL_TABLET | Freq: Three times a day (TID) | ORAL | Status: DC | PRN

## 2014-08-22 MED ORDER — FUROSEMIDE 20 MG PO TABS: 20.0000 mg | ORAL_TABLET | Freq: Every day | ORAL | Status: DC

## 2014-08-22 MED ORDER — CLONIDINE HCL 0.1 MG PO TABS: 0.1000 mg | ORAL_TABLET | Freq: Three times a day (TID) | ORAL | Status: DC

## 2014-08-22 MED ORDER — OMEPRAZOLE 20 MG PO CPDR: 20.0000 mg | DELAYED_RELEASE_CAPSULE | Freq: Every day | ORAL | Status: DC

## 2014-08-22 MED ORDER — AMLODIPINE BESYLATE 10 MG PO TABS: 10.0000 mg | ORAL_TABLET | Freq: Every day | ORAL | Status: DC

## 2014-08-22 MED ORDER — HYDRALAZINE HCL 50 MG PO TABS: 50.0000 mg | ORAL_TABLET | Freq: Three times a day (TID) | ORAL | Status: DC

## 2014-08-22 MED ORDER — ALBUTEROL SULFATE (2.5 MG/3ML) 0.083% IN NEBU: 2.5000 mg | INHALATION_SOLUTION | RESPIRATORY_TRACT | Status: DC | PRN

## 2014-08-22 MED ORDER — IPRATROPIUM-ALBUTEROL 0.5-2.5 (3) MG/3ML IN SOLN: 3.0000 mL | Freq: Four times a day (QID) | RESPIRATORY_TRACT | Status: DC

## 2014-08-22 MED ORDER — LEVOFLOXACIN 500 MG PO TABS: 500.0000 mg | ORAL_TABLET | Freq: Every day | ORAL | Status: DC

## 2014-08-22 MED ORDER — PREDNISONE 20 MG PO TABS: 60.0000 mg | ORAL_TABLET | Freq: Every day | ORAL | Status: DC

## 2014-08-22 MED ORDER — HYDROCOD POLST-CHLORPHEN POLST 10-8 MG/5ML PO LQCR: 5.0000 mL | Freq: Two times a day (BID) | ORAL | Status: DC | PRN

## 2014-08-22 NOTE — Plan of Care (Signed)
Problem: Phase I Progression Outcomes Goal: Hemodynamically stable Outcome: Progressing Goal: Other Phase I Outcomes/Goals Outcome: Completed/Met Date Met:  08/22/14  Problem: Phase II Progression Outcomes Goal: Wean O2 if indicated Outcome: Progressing  Problem: Phase II Progression Outcomes Goal: Pain controlled Outcome: Progressing Goal: Walk in hall or up in chair TID Outcome: Completed/Met Date Met:  08/22/14 Goal: Tolerating diet Outcome: Completed/Met Date Met:  08/22/14

## 2014-08-22 NOTE — Care Management Note (Signed)
    Page 1 of 1   08/22/2014     1:39:14 PM CARE MANAGEMENT NOTE 08/22/2014  Patient:  PENNYE, BEEGHLY   Account Number:  [id number]  Date Initiated:  08/21/2014  Documentation initiated by:  Karl Bales  Subjective/Objective Assessment:   pt admitted with cco Cough and shortness of breath     Action/Plan:   from home   Anticipated DC Date:  08/24/2014   Anticipated DC Plan:  HOME/SELF CARE         Choice offered to / List presented to:     DME arranged  NEBULIZER MACHINE      DME agency  [facility].        Status of service:  Completed, signed off Medicare Important Message given?   (If response is "NO", the following Medicare IM given date fields will be blank) Date Medicare IM given:   Medicare IM given by:   Date Additional Medicare IM given:   Additional Medicare IM given by:    Discharge Disposition:  HOME/SELF CARE  Per UR Regulation:  Reviewed for med. necessity/level of care/duration of stay  If discussed at Christmas of Stay Meetings, dates discussed:    Comments:  08/22/14 13:25 CM received call from RN requesting I arrange for Nebulizer machine to be delivered to room prior to discharge today.  CM called [facility] DME rep, Germaine who states he will deliver to room prior to discharge.  No other CM needs were communicated.  Mariane Masters, BSN, Cm [phone].  08/21/14 MMcGibboney, RN, BSN Chart reviewed.

## 2014-08-22 NOTE — Discharge Summary (Signed)
Physician Discharge Summary  Sharon Swanson YQM:[id number] DOB: [birth year] DOA: 08/17/2014  PCP: No primary care provider on file.  Admit date: 08/17/2014 Discharge date: 08/22/2014  Time spent: 30 minutes  Recommendations for Outpatient Follow-up:  1. Follow up with PCP i none week 2. Follow up with CT chest in 4 to 6 months.   Discharge Diagnoses:  Principal Problem:   CAP (community acquired pneumonia) Active Problems:   Tobacco abuse   Obesity (BMI 30-39.9)   Acute respiratory failure   Umbilical hernia   Pulmonary nodule, left   Discharge Condition: improved.   Diet recommendation: low sodium diet  Filed Weights   08/17/14 1353 08/21/14 0444  Weight: 133.403 kg (294 lb 1.6 oz) 130.273 kg (287 lb 3.2 oz)    History of present illness:  52 year old female history of hypertension currently not on any medications as she states she ran out, that presented to the emergency department with complaints of shortness of breath and cough. Patient was found to have acute distress due failure upon admission with community-acquired pneumonia. Patient has been on azithromycin and ceftriaxone. Patient did have some lower extremities swelling, Doppler was negative for DVT.  Her breathing and symptoms have improved her antibiotics were transitioned to po antibiotics and discharged home.   Hospital Course:   Acute respiratory failure with prolonged hypoxia -Likely secondary to community acquired pneumonia, complicated by smoking. Resolved with antibiotics and steroids.  -TRansition to po steroids and continue with nebulizer treatments  Community-acquired pneumonia -Continue azithromycin and ceftriaxone, tussionex and robitussin PRN, transitioned to oral antibiotics.  -Urine strep pneumonia negative. Negative, legionella urine Ag -Influenza PCR negative -HIV nonreactive  Left pulmonary nodule -Patient does have a long-standing history of smoking -Seen on CT scan -Recommended repeat  CT of the chest within 4-6 months  Umbilical hernia -Worsened with coughing episodes -Abdominal x-ray: no active abdominal findings  Lower extremity edema  -Lower extremity Doppler was negative for DVT -Echocardiogram done, showed grade 2 diastolic dysfunction. Started on low dose lasix.   Tobacco abuse -Continue nicotine patch -Smoking cessation counseling given  Morbid obesity, BMI 47 -Patient will need to followup with her PCP for dietary and lifestyle modifications upon discharge  GERD -Continue PPI   Accelerated hypertension: - resume home meds. Improved on discharge  Procedures:  none  Consultations:  none  Discharge Exam: Filed Vitals:   08/22/14 0812  BP: 164/97  Pulse:   Temp:   Resp:     General: alert afebrile comfortable Cardiovascular: s1s2 Respiratory: ctab  Discharge Instructions You were cared for by a hospitalist during your hospital stay. If you have any questions about your discharge medications or the care you received while you were in the hospital after you are discharged, you can call the unit and asked to speak with the hospitalist on call if the hospitalist that took care of you is not available. Once you are discharged, your primary care physician will handle any further medical issues. Please note that NO REFILLS for any discharge medications will be authorized once you are discharged, as it is imperative that you return to your primary care physician (or establish a relationship with a primary care physician if you do not have one) for your aftercare needs so that they can reassess your need for medications and monitor your lab values.  Discharge Instructions    Diet - low sodium heart healthy    Complete by:  As directed      Discharge instructions    Complete  by:  As directed   Follow up with PCP i one week.          Current Discharge Medication List    START taking these medications   Details  albuterol (PROVENTIL) (2.5  MG/3ML) 0.083% nebulizer solution Take 3 mLs (2.5 mg total) by nebulization every 2 (two) hours as needed for wheezing or shortness of breath. Qty: 75 mL, Refills: 1    chlorpheniramine-HYDROcodone (TUSSIONEX) 10-8 MG/5ML LQCR Take 5 mLs by mouth every 12 (twelve) hours as needed for cough. Qty: 115 mL, Refills: 0    hydrALAZINE (APRESOLINE) 50 MG tablet Take 1 tablet (50 mg total) by mouth every 8 (eight) hours. Qty: 90 tablet, Refills: 1    ipratropium-albuterol (DUONEB) 0.5-2.5 (3) MG/3ML SOLN Take 3 mLs by nebulization every 6 (six) hours. Qty: 360 mL, Refills: 1    predniSONE (DELTASONE) 20 MG tablet Take 3 tablets (60 mg total) by mouth daily with breakfast. Qty: 18 tablet, Refills: 0      CONTINUE these medications which have CHANGED   Details  amLODipine (NORVASC) 10 MG tablet Take 1 tablet (10 mg total) by mouth daily. Qty: 30 tablet, Refills: 1    cloNIDine (CATAPRES) 0.1 MG tablet Take 1 tablet (0.1 mg total) by mouth 3 (three) times daily. Qty: 90 tablet, Refills: 1    furosemide (LASIX) 20 MG tablet Take 1 tablet (20 mg total) by mouth daily. Qty: 30 tablet, Refills: 1    levofloxacin (LEVAQUIN) 500 MG tablet Take 1 tablet (500 mg total) by mouth daily. Qty: 2 tablet, Refills: 0    omeprazole (PRILOSEC) 20 MG capsule Take 1 capsule (20 mg total) by mouth daily. Qty: 30 capsule, Refills: 1    oxyCODONE-acetaminophen (PERCOCET/ROXICET) 5-325 MG per tablet Take 1 tablet by mouth every 8 (eight) hours as needed for severe pain. Qty: 8 tablet, Refills: 0      CONTINUE these medications which have NOT CHANGED   Details  acetaminophen (TYLENOL) 325 MG tablet Take 650 mg by mouth every 6 (six) hours as needed (pain).      STOP taking these medications     Linaclotide (LINZESS) 145 MCG CAPS      pantoprazole (PROTONIX) 40 MG tablet        Allergies  Allergen Reactions  . Aspirin     Abdominal pain      The results of significant diagnostics from this  hospitalization (including imaging, microbiology, ancillary and laboratory) are listed below for reference.    Significant Diagnostic Studies: Dg Chest 2 View  08/22/2014   CLINICAL DATA:  Left chest pain, pneumonia  EXAM: CHEST  2 VIEW  COMPARISON:  CT chest dated 08/17/2014  FINDINGS: Mild patchy opacity in the right upper and lower lobes, improved.  Known left lower lobe pneumonia (from prior CT) is not radiographically evident.  The heart is top-normal in size.  Degenerative changes of the visualized thoracolumbar spine.  IMPRESSION: Multifocal pneumonia, improved.   Electronically Signed   By: Julian Hy M.D.   On: 08/22/2014 11:51   Dg Chest 2 View  08/17/2014   CLINICAL DATA:  Cough and congestion for 3 days; shortness of breath  EXAM: CHEST  2 VIEW  COMPARISON:  September 24, 2013  FINDINGS: There is infiltrate in the axillary subsegment of the right upper lobe. In addition, there is a nodular opacity in the left mid lung measuring 1.5 x 0.9 cm. Lungs elsewhere clear. Heart size and pulmonary vascularity are normal. No adenopathy.  No bone lesions. There is diffuse idiopathic skeletal hyperostosis in the thoracic region.  IMPRESSION: 1.5 x 0.9 cm nodular opacity left mid lung. Advise noncontrast enhanced chest CT to further assess this lesion.  Infiltrate in the axillary subsegment of the right upper lobe.  Diffuse idiopathic skeletal hyperostosis in the thoracic spine.   Electronically Signed   By: Lowella Grip M.D.   On: 08/17/2014 09:18   Ct Chest Wo Contrast  08/17/2014   CLINICAL DATA:  53 year old female with cough, shortness of breath, fever and wheezing. Pulmonary nodule noted on chest x-ray obtained earlier today  EXAM: CT CHEST WITHOUT CONTRAST  TECHNIQUE: Multidetector CT imaging of the chest was performed following the standard protocol without IV contrast.  COMPARISON:  Chest x-ray obtained earlier today at 9:06 a.m.; prior CT scan of the chest 10/21/2011  FINDINGS:  Mediastinum: Unremarkable CT appearance of the thyroid gland. No suspicious mediastinal or hilar adenopathy. A 5 mm in short axis prevascular mediastinal node(image 25 series 2) is favored to be reactive. No soft tissue mediastinal mass. The thoracic esophagus is unremarkable.  Heart/Vascular: Limited evaluation in the absence of intravenous contrast. No aneurysmal dilatation. Heart remains within normal limits for size. No pericardial effusion.  Lungs/Pleura: No pleural effusion. Scattered patchy ground-glass attenuation opacity and a peribronchovascular distribution involving both upper and lower lungs. A somewhat nodular region of consolidation in the superior segment of the left lower lobe likely corresponds with the questioned pulmonary nodule on the prior chest x-ray. The background pulmonary parenchyma appears to be within normal limits. No generalized bronchial wall thickening or emphysematous change.  Bones/Soft Tissues: No acute fracture or aggressive appearing lytic or blastic osseous lesion. Contiguous flowing anterior osteophytes consistent with dystrophic radiographic skeletal hyperostosis.  Upper Abdomen: Hepatic steatosis. Otherwise, visualized upper abdominal organs are unremarkable.  IMPRESSION: 1. Multifocal patchy ground-glass attenuation opacities in a peribronchovascular distribution with regions of more focal consolidation and tree-in-bud micro nodularity involving both upper and lower lobes. Overall, these findings are most consistent with a multi lobar bronchopneumonia. Other multifocal infectious or inflammatory processes are considered less likely. 2. Somewhat nodular region of consolidation in the superior segment of the left lower lobe corresponds with the questioned pulmonary nodule on the prior chest x-ray. Consider followup chest CT in 4- 6 weeks following appropriate course of therapy to confirm resolution. 3. Hepatic steatosis.   Electronically Signed   By: Jacqulynn Cadet M.D.    On: 08/17/2014 14:53   Dg Abd 2 Views  08/17/2014   CLINICAL DATA:  Nausea, vomiting and diarrhea.  Initial encounter.  EXAM: ABDOMEN - 2 VIEW  COMPARISON:  CT 06/01/2012, ultrasound 06/01/2012 and radiographs 12/01/2008.  FINDINGS: The bowel gas pattern is normal. There is no free intraperitoneal air or suspicious abdominal calcification. Mild degenerative changes throughout the spine are stable.  IMPRESSION: No active abdominal findings.  Normal bowel gas pattern.   Electronically Signed   By: Camie Patience M.D.   On: 08/17/2014 14:43    Microbiology: No results found for this or any previous visit (from the past 240 hour(s)).   Labs: Basic Metabolic Panel:  Recent Labs Lab 08/17/14 0850 08/18/14 0500 08/20/14 0931  NA 135* 138 132*  K 4.4 4.6 4.8  CL 95* 98 92*  CO2 26 26 27   GLUCOSE 103* 188* 293*  BUN 8 12 19   CREATININE 0.63 0.54 0.54  CALCIUM 9.3 9.6 9.4   Liver Function Tests:  Recent Labs Lab 08/18/14 0500  AST 29  ALT 55*  ALKPHOS 67  BILITOT 0.2*  PROT 8.1  ALBUMIN 3.6   No results for input(s): LIPASE, AMYLASE in the last 168 hours. No results for input(s): AMMONIA in the last 168 hours. CBC:  Recent Labs Lab 08/17/14 0850 08/18/14 0500  WBC 4.5 4.4  NEUTROABS 3.4  --   HGB 15.6* 15.9*  HCT 48.0* 49.5*  MCV 89.4 90.0  PLT 157 166   Cardiac Enzymes: No results for input(s): CKTOTAL, CKMB, CKMBINDEX, TROPONINI in the last 168 hours. BNP: BNP (last 3 results) No results for input(s): PROBNP in the last 8760 hours. CBG: No results for input(s): GLUCAP in the last 168 hours.     SignedHosie Poisson  [facility] 08/22/2014, 12:33 PM

## 2014-11-23 ENCOUNTER — Encounter (HOSPITAL_COMMUNITY): Payer: Self-pay | Admitting: Emergency Medicine

## 2014-11-23 ENCOUNTER — Emergency Department (HOSPITAL_COMMUNITY): Payer: BC Managed Care – PPO

## 2014-11-23 ENCOUNTER — Emergency Department (HOSPITAL_COMMUNITY)
Admission: EM | Admit: 2014-11-23 | Discharge: 2014-11-23 | Disposition: A | Discharge status: Home / Self Care | Payer: BC Managed Care – PPO | Attending: Emergency Medicine | Admitting: Emergency Medicine

## 2014-11-23 DIAGNOSIS — Z792 Long term (current) use of antibiotics: Secondary | ICD-10-CM | POA: Insufficient documentation

## 2014-11-23 DIAGNOSIS — Z79899 Other long term (current) drug therapy: Secondary | ICD-10-CM | POA: Insufficient documentation

## 2014-11-23 DIAGNOSIS — I10 Essential (primary) hypertension: Secondary | ICD-10-CM | POA: Diagnosis not present

## 2014-11-23 DIAGNOSIS — E669 Obesity, unspecified: Secondary | ICD-10-CM | POA: Insufficient documentation

## 2014-11-23 DIAGNOSIS — J45901 Unspecified asthma with (acute) exacerbation: Secondary | ICD-10-CM | POA: Diagnosis not present

## 2014-11-23 DIAGNOSIS — R0789 Other chest pain: Secondary | ICD-10-CM | POA: Insufficient documentation

## 2014-11-23 DIAGNOSIS — R0602 Shortness of breath: Secondary | ICD-10-CM | POA: Diagnosis present

## 2014-11-23 DIAGNOSIS — K219 Gastro-esophageal reflux disease without esophagitis: Secondary | ICD-10-CM | POA: Insufficient documentation

## 2014-11-23 DIAGNOSIS — Z720 Tobacco use: Secondary | ICD-10-CM | POA: Insufficient documentation

## 2014-11-23 DIAGNOSIS — Z8742 Personal history of other diseases of the female genital tract: Secondary | ICD-10-CM | POA: Insufficient documentation

## 2014-11-23 HISTORY — DX: Unspecified asthma, uncomplicated: J45.909

## 2014-11-23 LAB — BASIC METABOLIC PANEL
ANION GAP: 7 (ref 5–15)
BUN: 16 mg/dL (ref 6–23)
CHLORIDE: 105 mmol/L (ref 96–112)
CO2: 26 mmol/L (ref 19–32)
Calcium: 8.9 mg/dL (ref 8.4–10.5)
Creatinine, Ser: 0.59 mg/dL (ref 0.50–1.10)
GFR calc non Af Amer: >90 mL/min (ref >90)
Glucose, Bld: 118 mg/dL — ABNORMAL HIGH (ref 70–99)
POTASSIUM: 4.1 mmol/L (ref 3.5–5.1)
Sodium: 138 mmol/L (ref 135–145)

## 2014-11-23 LAB — CBC WITH DIFFERENTIAL/PLATELET
BASOS PCT: 0 % (ref 0–1)
Basophils Absolute: 0 10*3/uL (ref 0.0–0.1)
Eosinophils Absolute: 0.2 10*3/uL (ref 0.0–0.7)
Eosinophils Relative: 3 % (ref 0–5)
HEMATOCRIT: 45.2 % (ref 36.0–46.0)
Hemoglobin: 13.9 g/dL (ref 12.0–15.0)
Lymphocytes Relative: 20 % (ref 12–46)
Lymphs Abs: 1.2 10*3/uL (ref 0.7–4.0)
MCH: 28.3 pg (ref 26.0–34.0)
MCHC: 30.8 g/dL (ref 30.0–36.0)
MCV: 91.9 fL (ref 78.0–100.0)
MONO ABS: 0.8 10*3/uL (ref 0.1–1.0)
MONOS PCT: 12 % (ref 3–12)
NEUTROS ABS: 4 10*3/uL (ref 1.7–7.7)
Neutrophils Relative %: 65 % (ref 43–77)
Platelets: 215 10*3/uL (ref 150–400)
RBC: 4.92 MIL/uL (ref 3.87–5.11)
RDW: 15.4 % (ref 11.5–15.5)
WBC: 6.1 10*3/uL (ref 4.0–10.5)

## 2014-11-23 LAB — I-STAT TROPONIN, ED: Troponin i, poc: 0 ng/mL (ref 0.00–0.08)

## 2014-11-23 MED ORDER — MORPHINE SULFATE 4 MG/ML IJ SOLN
4.0000 mg | Freq: Once | INTRAMUSCULAR | Status: AC
  Administered 2014-11-23: 4 mg via INTRAVENOUS
  Filled 2014-11-23: qty 1

## 2014-11-23 MED ORDER — IPRATROPIUM-ALBUTEROL 0.5-2.5 (3) MG/3ML IN SOLN
3.0000 mL | RESPIRATORY_TRACT | Status: AC
  Administered 2014-11-23 (×2): 3 mL via RESPIRATORY_TRACT
  Filled 2014-11-23 (×2): qty 3

## 2014-11-23 MED ORDER — SODIUM CHLORIDE 0.9 % IV SOLN
INTRAVENOUS | Status: DC
  Administered 2014-11-23: 08:00:00 via INTRAVENOUS

## 2014-11-23 MED ORDER — OXYCODONE-ACETAMINOPHEN 5-325 MG PO TABS
1.0000 | ORAL_TABLET | ORAL | Status: DC | PRN
Start: 2014-11-23 — End: 2016-04-13

## 2014-11-23 MED ORDER — IPRATROPIUM-ALBUTEROL 0.5-2.5 (3) MG/3ML IN SOLN: 3.0000 mL | Freq: Once | RESPIRATORY_TRACT | Status: DC

## 2014-11-23 MED ORDER — ALBUTEROL (5 MG/ML) CONTINUOUS INHALATION SOLN
15.0000 mg/h | INHALATION_SOLUTION | RESPIRATORY_TRACT | Status: DC
  Administered 2014-11-23: 15 mg/h via RESPIRATORY_TRACT
  Filled 2014-11-23: qty 20

## 2014-11-23 MED ORDER — ALBUTEROL SULFATE HFA 108 (90 BASE) MCG/ACT IN AERS: 2.0000 | INHALATION_SPRAY | RESPIRATORY_TRACT | Status: DC | PRN

## 2014-11-23 MED ORDER — METHYLPREDNISOLONE SODIUM SUCC 125 MG IJ SOLR
125.0000 mg | Freq: Once | INTRAMUSCULAR | Status: AC
  Administered 2014-11-23: 125 mg via INTRAVENOUS
  Filled 2014-11-23: qty 2

## 2014-11-23 MED ORDER — PREDNISONE 10 MG PO TABS: 20.0000 mg | ORAL_TABLET | Freq: Every day | ORAL | Status: DC

## 2014-11-23 NOTE — ED Notes (Addendum)
Pt arrrived with c/o of SOB and dyspnea x3 weeks with productive cough of clear sputum with streaks of blood noted. Pt reported sharp pain to rt lower lobe/lateral rib area with cough and movement. Auscultated exp wheezing bil and throughout. Resp even and slightly labored.

## 2014-11-23 NOTE — ED Notes (Signed)
Patient transported to X-ray 

## 2014-11-23 NOTE — ED Notes (Signed)
ambulated pt while ambulating pt's oxygen dropped to 91%

## 2014-11-23 NOTE — ED Provider Notes (Signed)
TIME SEEN: 7:05 AM  CHIEF COMPLAINT: R sided chest pain, shortness of breath  HPI: Pt is a 54 y.o. female with history of hypertension, asthma who presents emergency department with 3 weeks of progressively worsening dyspnea, cough with clear/white sputum production with streaks of blood and severe right-sided sharp lateral chest pain without radiation that is worse with coughing and movement. No alleviating factors.  No fevers or chills. No lower extremity swelling or pain. No prior history of PE or DVT. Does not wear oxygen at home. Is a smoker.  ROS: See HPI Constitutional: no fever  Eyes: no drainage  ENT: no runny nose   Cardiovascular:  chest pain  Resp: SOB  GI: no vomiting GU: no dysuria Integumentary: no rash  Allergy: no hives  Musculoskeletal: no leg swelling  Neurological: no slurred speech ROS otherwise negative  PAST MEDICAL HISTORY/PAST SURGICAL HISTORY:  Past Medical History  Diagnosis Date  . Acid reflux   . Hypertension   . Fibroids 11/08/2011  . Obesity (BMI 30-39.9) 11/08/2011  . Fatty liver 11/08/2011  . Pancreatitis 11/08/2011  . Ventral hernia   . Asthma     MEDICATIONS:  Prior to Admission medications   Medication Sig Start Date End Date Taking? Authorizing Provider  acetaminophen (TYLENOL) 325 MG tablet Take 650 mg by mouth every 6 (six) hours as needed (pain).    Historical Provider, MD  albuterol (PROVENTIL) (2.5 MG/3ML) 0.083% nebulizer solution Take 3 mLs (2.5 mg total) by nebulization every 2 (two) hours as needed for wheezing or shortness of breath. 08/22/14   Hosie Poisson, MD  amLODipine (NORVASC) 10 MG tablet Take 1 tablet (10 mg total) by mouth daily. 08/22/14   Hosie Poisson, MD  chlorpheniramine-HYDROcodone (TUSSIONEX) 10-8 MG/5ML LQCR Take 5 mLs by mouth every 12 (twelve) hours as needed for cough. 08/22/14   Hosie Poisson, MD  cloNIDine (CATAPRES) 0.1 MG tablet Take 1 tablet (0.1 mg total) by mouth 3 (three) times daily. 08/22/14   Hosie Poisson,  MD  furosemide (LASIX) 20 MG tablet Take 1 tablet (20 mg total) by mouth daily. 08/22/14   Hosie Poisson, MD  hydrALAZINE (APRESOLINE) 50 MG tablet Take 1 tablet (50 mg total) by mouth every 8 (eight) hours. 08/22/14   Hosie Poisson, MD  ipratropium-albuterol (DUONEB) 0.5-2.5 (3) MG/3ML SOLN Take 3 mLs by nebulization every 6 (six) hours. 08/22/14   Hosie Poisson, MD  levofloxacin (LEVAQUIN) 500 MG tablet Take 1 tablet (500 mg total) by mouth daily. 08/22/14   Hosie Poisson, MD  omeprazole (PRILOSEC) 20 MG capsule Take 1 capsule (20 mg total) by mouth daily. 08/22/14   Hosie Poisson, MD  oxyCODONE-acetaminophen (PERCOCET/ROXICET) 5-325 MG per tablet Take 1 tablet by mouth every 8 (eight) hours as needed for severe pain. 08/22/14   Hosie Poisson, MD  predniSONE (DELTASONE) 20 MG tablet Take 3 tablets (60 mg total) by mouth daily with breakfast. 08/22/14   Hosie Poisson, MD    ALLERGIES:  Allergies  Allergen Reactions  . Aspirin     Abdominal pain    SOCIAL HISTORY:  History  Substance Use Topics  . Smoking status: Current Every Day Smoker -- 0.50 packs/day    Types: Cigarettes  . Smokeless tobacco: Never Used  . Alcohol Use: No    FAMILY HISTORY: Family History  Problem Relation Age of Onset  . Hypertension Mother     EXAM: BP 155/87 mmHg  Pulse 107  Temp(Src) 98.8 F (37.1 C) (Oral)  Resp 18  Ht 5'  8" (1.727 m)  Wt 265 lb (120.203 kg)  BMI 40.30 kg/m2  SpO2 92% CONSTITUTIONAL: Alert and oriented and responds appropriately to questions.  well-nourished, obese, appears uncomfortable but is in not in distress HEAD: Normocephalic EYES: Conjunctivae clear, PERRL ENT: normal nose; no rhinorrhea; moist mucous membranes; pharynx without lesions noted NECK: Supple, no meningismus, no LAD  CARD: Regular and tachycardic; S1 and S2 appreciated; no murmurs, no clicks, no rubs, no gallops RESP: Normal chest excursion without splinting or tachypnea; breath sounds equal bilaterally, no  rhonchi or rales, chest wall is tender to palpation over the right lower anterior and right lateral ribs without crepitus or ecchymosis or any deformity, she has diffuse expiratory wheezing; oxygen saturation 92-93% on room air, no respiratory distress ABD/GI: Normal bowel sounds; non-distended; soft, non-tender, no rebound, no guarding BACK:  The back appears normal and is non-tender to palpation, there is no CVA tenderness EXT: Normal ROM in all joints; non-tender to palpation; no edema; normal capillary refill; no cyanosis; no calf tenderness or swelling    SKIN: Normal color for age and race; warm NEURO: Moves all extremities equally PSYCH: The patient's mood and manner are appropriate. Grooming and personal hygiene are appropriate.  MEDICAL DECISION MAKING: Patient here with right-sided chest pain is worse with palpation, movement and cough. States that she has been coughing so hard that she thinks this is what caused her pain. We'll obtain a right rib series to evaluate for possible fracture as well as chest x-ray to evaluate for pneumonia. We'll give continuous albuterol, Solu-Medrol given her history of tobacco use, asthma and smoking. We'll also obtain labs. Will give pain medicine for her right rib pain.  ED PROGRESS: 9:20 AM  Pt reports feeling better. Her labs are unremarkable. Chest x-ray clear. She was able to angulate her sense day 91% or greater. She still having mild expiratory wheezing. We'll give DuoNeb treatment. She still complaining of right-sided chest pain. I do not think this is ACS but more likely musculoskeletal. Will give another dose of IV morphine.  10:20 AM  Pt's lungs are much better after 2 duo neb treatments. She does still have some mild expiratory wheezing. Have offered admission but patient reports she would like to go home. We'll discharge with steroid burst, albuterol inhaler. Will also discharge with pain medication for her chest wall pain. Discussed return  precautions. She verbalized understanding and is comfortable with plan.     EKG Interpretation  Date/Time:  Monday November 23 2014 07:24:53 EST Ventricular Rate:  102 PR Interval:  145 QRS Duration: 77 QT Interval:  362 QTC Calculation: 471 R Axis:   -37 Text Interpretation:  Sinus tachycardia Probable left atrial enlargement Left axis deviation Probable anteroseptal infarct, old Baseline wander in lead(s) II III aVF No significant change since last tracing Confirmed by Sanika Brosious,  DO, Mariselda Badalamenti [phone]) on 11/23/2014 7:39:51 AM           [place], DO 11/23/14 1021

## 2014-11-23 NOTE — ED Notes (Signed)
Patient receiving continuous nebulizer Tx at this time. Will check pulse oximetry while ambulating when Tx finishes.

## 2014-11-23 NOTE — Discharge Instructions (Signed)
Asthma, Acute Bronchospasm °Acute bronchospasm caused by asthma is also referred to as an asthma attack. Bronchospasm means your air passages become narrowed. The narrowing is caused by inflammation and tightening of the muscles in the air tubes (bronchi) in your lungs. This can make it hard to breathe or cause you to wheeze and cough. °CAUSES °Possible triggers are: °· Animal dander from the skin, hair, or feathers of animals. °· Dust mites contained in house dust. °· Cockroaches. °· Pollen from trees or grass. °· Mold. °· Cigarette or tobacco smoke. °· Air pollutants such as dust, household cleaners, hair sprays, aerosol sprays, paint fumes, strong chemicals, or strong odors. °· Cold air or weather changes. Cold air may trigger inflammation. Winds increase molds and pollens in the air. °· Strong emotions such as crying or laughing hard. °· Stress. °· Certain medicines such as aspirin or beta-blockers. °· Sulfites in foods and drinks, such as dried fruits and wine. °· Infections or inflammatory conditions, such as a flu, cold, or inflammation of the nasal membranes (rhinitis). °· Gastroesophageal reflux disease (GERD). GERD is a condition where stomach acid backs up into your esophagus. °· Exercise or strenuous activity. °SIGNS AND SYMPTOMS  °· Wheezing. °· Excessive coughing, particularly at night. °· Chest tightness. °· Shortness of breath. °DIAGNOSIS  °Your health care provider will ask you about your medical history and perform a physical exam. A chest X-ray or blood testing may be performed to look for other causes of your symptoms or other conditions that may have triggered your asthma attack.  °TREATMENT  °Treatment is aimed at reducing inflammation and opening up the airways in your lungs.  Most asthma attacks are treated with inhaled medicines. These include quick relief or rescue medicines (such as bronchodilators) and controller medicines (such as inhaled corticosteroids). These medicines are sometimes  given through an inhaler or a nebulizer. Systemic steroid medicine taken by mouth or given through an IV tube also can be used to reduce the inflammation when an attack is moderate or severe. Antibiotic medicines are only used if a bacterial infection is present.  °HOME CARE INSTRUCTIONS  °· Rest. °· Drink plenty of liquids. This helps the mucus to remain thin and be easily coughed up. Only use caffeine in moderation and do not use alcohol until you have recovered from your illness. °· Do not smoke. Avoid being exposed to secondhand smoke. °· You play a critical role in keeping yourself in good health. Avoid exposure to things that cause you to wheeze or to have breathing problems. °· Keep your medicines up-to-date and available. Carefully follow your health care provider's treatment plan. °· Take your medicine exactly as prescribed. °· When pollen or pollution is bad, keep windows closed and use an air conditioner or go to places with air conditioning. °· Asthma requires careful medical care. See your health care provider for a follow-up as advised. If you are more than [redacted] weeks pregnant and you were prescribed any new medicines, let your obstetrician know about the visit and how you are doing. Follow up with your health care provider as directed. °· After you have recovered from your asthma attack, make an appointment with your outpatient doctor to talk about ways to reduce the likelihood of future attacks. If you do not have a doctor who manages your asthma, make an appointment with a primary care doctor to discuss your asthma. °SEEK IMMEDIATE MEDICAL CARE IF:  °· You are getting worse. °· You have trouble breathing. If severe, call your local   emergency services (911 in the [place]  You develop chest pain or discomfort.  You are vomiting.  You are not able to keep fluids down.  You are coughing up yellow, green, brown, or bloody sputum.  You have a fever and your symptoms suddenly get worse.  You have  trouble swallowing. MAKE SURE YOU:   Understand these instructions.  Will watch your condition.  Will get help right away if you are not doing well or get worse. Document Released: 12/27/2006 Document Revised: 09/16/2013 Document Reviewed: 03/19/2013 [facility] Patient Information 2015 [place], [place]. This information is not intended to replace advice given to you by your health care provider. Make sure you discuss any questions you have with your health care provider.   Smoking Cessation Quitting smoking is important to your health and has many advantages. However, it is not always easy to quit since nicotine is a very addictive drug. Oftentimes, people try 3 times or more before being able to quit. This document explains the best ways for you to prepare to quit smoking. Quitting takes hard work and a lot of effort, but you can do it. ADVANTAGES OF QUITTING SMOKING  You will live longer, feel better, and live better.  Your body will feel the impact of quitting smoking almost immediately.  Within 20 minutes, blood pressure decreases. Your pulse returns to its normal level.  After 8 hours, carbon monoxide levels in the blood return to normal. Your oxygen level increases.  After 24 hours, the chance of having a heart attack starts to decrease. Your breath, hair, and body stop smelling like smoke.  After 48 hours, damaged nerve endings begin to recover. Your sense of taste and smell improve.  After 72 hours, the body is virtually free of nicotine. Your bronchial tubes relax and breathing becomes easier.  After 2 to 12 weeks, lungs can hold more air. Exercise becomes easier and circulation improves.  The risk of having a heart attack, stroke, cancer, or lung disease is greatly reduced.  After 1 year, the risk of coronary heart disease is cut in half.  After 5 years, the risk of stroke falls to the same as a nonsmoker.  After 10 years, the risk of lung cancer is cut in half and the  risk of other cancers decreases significantly.  After 15 years, the risk of coronary heart disease drops, usually to the level of a nonsmoker.  If you are pregnant, quitting smoking will improve your chances of having a healthy baby.  The people you live with, especially any children, will be healthier.  You will have extra money to spend on things other than cigarettes. QUESTIONS TO THINK ABOUT BEFORE ATTEMPTING TO QUIT You may want to talk about your answers with your health care provider.  Why do you want to quit?  If you tried to quit in the past, what helped and what did not?  What will be the most difficult situations for you after you quit? How will you plan to handle them?  Who can help you through the tough times? Your family? Friends? A health care provider?  What pleasures do you get from smoking? What ways can you still get pleasure if you quit? Here are some questions to ask your health care provider:  How can you help me to be successful at quitting?  What medicine do you think would be best for me and how should I take it?  What should I do if I need more help?  What is smoking withdrawal like? How can I get information on withdrawal? GET READY  Set a quit date.  Change your environment by getting rid of all cigarettes, ashtrays, matches, and lighters in your home, car, or work. Do not let people smoke in your home.  Review your past attempts to quit. Think about what worked and what did not. GET SUPPORT AND ENCOURAGEMENT You have a better chance of being successful if you have help. You can get support in many ways.  Tell your family, friends, and coworkers that you are going to quit and need their support. Ask them not to smoke around you.  Get individual, group, or telephone counseling and support. Programs are available at [employer] and health centers. Call your local health department for information about programs in your area.  Spiritual beliefs  and practices may help some smokers quit.  Download a "quit meter" on your computer to keep track of quit statistics, such as how long you have gone without smoking, cigarettes not smoked, and money saved.  Get a self-help book about quitting smoking and staying off tobacco. [place] yourself from urges to smoke. Talk to someone, go for a walk, or occupy your time with a task.  Change your normal routine. Take a different route to work. Drink tea instead of coffee. Eat breakfast in a different place.  Reduce your stress. Take a hot bath, exercise, or read a book.  Plan something enjoyable to do every day. Reward yourself for not smoking.  Explore interactive web-based programs that specialize in helping you quit. GET MEDICINE AND USE IT CORRECTLY Medicines can help you stop smoking and decrease the urge to smoke. Combining medicine with the above behavioral methods and support can greatly increase your chances of successfully quitting smoking.  Nicotine replacement therapy helps deliver nicotine to your body without the negative effects and risks of smoking. Nicotine replacement therapy includes nicotine gum, lozenges, inhalers, nasal sprays, and skin patches. Some may be available over-the-counter and others require a prescription.  Antidepressant medicine helps people abstain from smoking, but how this works is unknown. This medicine is available by prescription.  Nicotinic receptor partial agonist medicine simulates the effect of nicotine in your brain. This medicine is available by prescription. Ask your health care provider for advice about which medicines to use and how to use them based on your health history. Your health care provider will tell you what side effects to look out for if you choose to be on a medicine or therapy. Carefully read the information on the package. Do not use any other product containing nicotine while using a nicotine  replacement product.  RELAPSE OR DIFFICULT SITUATIONS Most relapses occur within the first 3 months after quitting. Do not be discouraged if you start smoking again. Remember, most people try several times before finally quitting. You may have symptoms of withdrawal because your body is used to nicotine. You may crave cigarettes, be irritable, feel very hungry, cough often, get headaches, or have difficulty concentrating. The withdrawal symptoms are only temporary. They are strongest when you first quit, but they will go away within 10-14 days. To reduce the chances of relapse, try to:  Avoid drinking alcohol. Drinking lowers your chances of successfully quitting.  Reduce the amount of caffeine you consume. Once you quit smoking, the amount of caffeine in your body increases and can give you symptoms, such as a rapid heartbeat, sweating, and anxiety.  Avoid smokers  because they can make you want to smoke.  Do not let weight gain distract you. Many smokers will gain weight when they quit, usually less than 10 pounds. Eat a healthy diet and stay active. You can always lose the weight gained after you quit.  Find ways to improve your mood other than smoking. FOR MORE INFORMATION  www.smokefree.gov  Document Released: 09/05/2001 Document Revised: 01/26/2014 Document Reviewed: 12/21/2011 [facility] Patient Information 2015 [place], [place]. This information is not intended to replace advice given to you by your health care provider. Make sure you discuss any questions you have with your health care provider.   Chest Wall Pain Chest wall pain is pain in or around the bones and muscles of your chest. It may take up to 6 weeks to get better. It may take longer if you must stay physically active in your work and activities.  CAUSES  Chest wall pain may happen on its own. However, it may be caused by:  A viral illness like the  flu.  Injury.  Coughing.  Exercise.  Arthritis.  Fibromyalgia.  Shingles. HOME CARE INSTRUCTIONS   Avoid overtiring physical activity. Try not to strain or perform activities that cause pain. This includes any activities using your chest or your abdominal and side muscles, especially if heavy weights are used.  Put ice on the sore area.  Put ice in a plastic bag.  Place a towel between your skin and the bag.  Leave the ice on for 15-20 minutes per hour while awake for the first 2 days.  Only take over-the-counter or prescription medicines for pain, discomfort, or fever as directed by your caregiver. SEEK IMMEDIATE MEDICAL CARE IF:   Your pain increases, or you are very uncomfortable.  You have a fever.  Your chest pain becomes worse.  You have new, unexplained symptoms.  You have nausea or vomiting.  You feel sweaty or lightheaded.  You have a cough with phlegm (sputum), or you cough up blood. MAKE SURE YOU:   Understand these instructions.  Will watch your condition.  Will get help right away if you are not doing well or get worse. Document Released: 09/11/2005 Document Revised: 12/04/2011 Document Reviewed: 05/08/2011 [facility] Patient Information 2015 [place], [place]. This information is not intended to replace advice given to you by your health care provider. Make sure you discuss any questions you have with your health care provider.

## 2014-12-10 ENCOUNTER — Emergency Department (HOSPITAL_COMMUNITY)
Admission: EM | Admit: 2014-12-10 | Discharge: 2014-12-10 | Disposition: A | Discharge status: Home / Self Care | Payer: BC Managed Care – PPO | Attending: Emergency Medicine | Admitting: Emergency Medicine

## 2014-12-10 ENCOUNTER — Encounter (HOSPITAL_COMMUNITY): Payer: Self-pay | Admitting: Emergency Medicine

## 2014-12-10 ENCOUNTER — Emergency Department (HOSPITAL_COMMUNITY): Payer: BC Managed Care – PPO

## 2014-12-10 DIAGNOSIS — Z720 Tobacco use: Secondary | ICD-10-CM | POA: Diagnosis not present

## 2014-12-10 DIAGNOSIS — Z8742 Personal history of other diseases of the female genital tract: Secondary | ICD-10-CM | POA: Diagnosis not present

## 2014-12-10 DIAGNOSIS — I10 Essential (primary) hypertension: Secondary | ICD-10-CM | POA: Diagnosis not present

## 2014-12-10 DIAGNOSIS — R079 Chest pain, unspecified: Secondary | ICD-10-CM | POA: Insufficient documentation

## 2014-12-10 DIAGNOSIS — Z79899 Other long term (current) drug therapy: Secondary | ICD-10-CM | POA: Diagnosis not present

## 2014-12-10 DIAGNOSIS — K219 Gastro-esophageal reflux disease without esophagitis: Secondary | ICD-10-CM | POA: Insufficient documentation

## 2014-12-10 DIAGNOSIS — J45901 Unspecified asthma with (acute) exacerbation: Secondary | ICD-10-CM | POA: Insufficient documentation

## 2014-12-10 DIAGNOSIS — R0602 Shortness of breath: Secondary | ICD-10-CM | POA: Diagnosis present

## 2014-12-10 LAB — BASIC METABOLIC PANEL
Anion gap: 11 (ref 5–15)
BUN: 13 mg/dL (ref 6–23)
CALCIUM: 9.4 mg/dL (ref 8.4–10.5)
CO2: 26 mmol/L (ref 19–32)
CREATININE: 1 mg/dL (ref 0.50–1.10)
Chloride: 106 mmol/L (ref 96–112)
GFR calc Af Amer: 73 mL/min — ABNORMAL LOW (ref >90)
GFR, EST NON AFRICAN AMERICAN: 63 mL/min — AB (ref >90)
Glucose, Bld: 115 mg/dL — ABNORMAL HIGH (ref 70–99)
POTASSIUM: 4.1 mmol/L (ref 3.5–5.1)
Sodium: 143 mmol/L (ref 135–145)

## 2014-12-10 LAB — I-STAT TROPONIN, ED: Troponin i, poc: 0 ng/mL (ref 0.00–0.08)

## 2014-12-10 LAB — CBC
HEMATOCRIT: 48.2 % — AB (ref 36.0–46.0)
Hemoglobin: 15.1 g/dL — ABNORMAL HIGH (ref 12.0–15.0)
MCH: 28.7 pg (ref 26.0–34.0)
MCHC: 31.3 g/dL (ref 30.0–36.0)
MCV: 91.5 fL (ref 78.0–100.0)
Platelets: 214 10*3/uL (ref 150–400)
RBC: 5.27 MIL/uL — ABNORMAL HIGH (ref 3.87–5.11)
RDW: 15.2 % (ref 11.5–15.5)
WBC: 4.6 10*3/uL (ref 4.0–10.5)

## 2014-12-10 LAB — BRAIN NATRIURETIC PEPTIDE: B Natriuretic Peptide: 26.5 pg/mL (ref 0.0–100.0)

## 2014-12-10 LAB — D-DIMER, QUANTITATIVE: D-Dimer, Quant: 0.41 ug/mL-FEU (ref 0.00–0.48)

## 2014-12-10 MED ORDER — KETOROLAC TROMETHAMINE 30 MG/ML IJ SOLN
30.0000 mg | Freq: Once | INTRAMUSCULAR | Status: AC
  Administered 2014-12-10: 30 mg via INTRAVENOUS
  Filled 2014-12-10: qty 1

## 2014-12-10 MED ORDER — ALBUTEROL SULFATE HFA 108 (90 BASE) MCG/ACT IN AERS: 4.0000 | INHALATION_SPRAY | Freq: Once | RESPIRATORY_TRACT | Status: DC

## 2014-12-10 MED ORDER — ALBUTEROL SULFATE (2.5 MG/3ML) 0.083% IN NEBU
5.0000 mg | INHALATION_SOLUTION | Freq: Once | RESPIRATORY_TRACT | Status: AC
  Administered 2014-12-10: 5 mg via RESPIRATORY_TRACT
  Filled 2014-12-10: qty 6

## 2014-12-10 NOTE — ED Provider Notes (Signed)
CSN: [id number]     Arrival date & time 12/10/14  9528 History   First MD Initiated Contact with Patient 12/10/14 [phone]     Chief Complaint  Patient presents with  . Chest Pain  . Shortness of Breath     (Consider location/radiation/quality/duration/timing/severity/associated sxs/prior Treatment) Patient is a 54 y.o. female presenting with chest pain and shortness of breath.  Chest Pain Pain location:  L chest Pain quality: sharp and stabbing   Pain radiates to:  Does not radiate Pain severity:  Severe Onset quality:  Gradual Duration: several weeks. Timing:  Constant Progression:  Worsening Chronicity:  New Context comment:  Seen in the ER a few weeks ago for similar.  Thought to be MSK related chest pain.  Also treated for pneumonia several weeks ago.  Relieved by:  Nothing Worsened by:  Nothing tried Associated symptoms: cough, lower extremity edema (started a few days ago) and shortness of breath   Associated symptoms: no abdominal pain and no fever   Shortness of Breath Associated symptoms: chest pain and cough   Associated symptoms: no abdominal pain and no fever     Past Medical History  Diagnosis Date  . Acid reflux   . Hypertension   . Fibroids 11/08/2011  . Obesity (BMI 30-39.9) 11/08/2011  . Fatty liver 11/08/2011  . Pancreatitis 11/08/2011  . Ventral hernia   . Asthma    Past Surgical History  Procedure Laterality Date  . Orthopedic surgery      R ankle, tendonitis   Family History  Problem Relation Age of Onset  . Hypertension Mother   . Diabetes Other    History  Substance Use Topics  . Smoking status: Current Every Day Smoker -- 0.50 packs/day    Types: Cigarettes  . Smokeless tobacco: Never Used  . Alcohol Use: No   OB History    Gravida Para Term Preterm AB TAB SAB Ectopic Multiple Living   0 0 0 0 0 0 0 0 0 0      Review of Systems  Constitutional: Negative for fever.  Respiratory: Positive for cough and shortness of breath.    Cardiovascular: Positive for chest pain.  Gastrointestinal: Negative for abdominal pain.  All other systems reviewed and are negative.     Allergies  Aspirin  Home Medications   Prior to Admission medications   Medication Sig Start Date End Date Taking? Authorizing Provider  albuterol (PROVENTIL HFA;VENTOLIN HFA) 108 (90 BASE) MCG/ACT inhaler Inhale 2 puffs into the lungs every 4 (four) hours as needed for wheezing or shortness of breath. 11/23/14  Yes Kristen N Ward, DO  amLODipine (NORVASC) 10 MG tablet Take 1 tablet (10 mg total) by mouth daily. 08/22/14  Yes Hosie Poisson, MD  cloNIDine (CATAPRES) 0.1 MG tablet Take 1 tablet (0.1 mg total) by mouth 3 (three) times daily. 08/22/14  Yes Hosie Poisson, MD  furosemide (LASIX) 20 MG tablet Take 1 tablet (20 mg total) by mouth daily. 08/22/14  Yes Hosie Poisson, MD  hydrALAZINE (APRESOLINE) 50 MG tablet Take 1 tablet (50 mg total) by mouth every 8 (eight) hours. 08/22/14  Yes Hosie Poisson, MD  ipratropium-albuterol (DUONEB) 0.5-2.5 (3) MG/3ML SOLN Take 3 mLs by nebulization every 6 (six) hours. 08/22/14  Yes Hosie Poisson, MD  metFORMIN (GLUCOPHAGE) 1000 MG tablet Take 1,000 mg by mouth daily.   Yes Historical Provider, MD  omeprazole (PRILOSEC) 20 MG capsule Take 1 capsule (20 mg total) by mouth daily. 08/22/14  Yes Hosie Poisson, MD  albuterol (PROVENTIL) (2.5 MG/3ML) 0.083% nebulizer solution Take 3 mLs (2.5 mg total) by nebulization every 2 (two) hours as needed for wheezing or shortness of breath. Patient not taking: Reported on 12/10/2014 08/22/14   Hosie Poisson, MD  chlorpheniramine-HYDROcodone (TUSSIONEX) 10-8 MG/5ML LQCR Take 5 mLs by mouth every 12 (twelve) hours as needed for cough. Patient not taking: Reported on 12/10/2014 08/22/14   Hosie Poisson, MD  oxyCODONE-acetaminophen (PERCOCET/ROXICET) 5-325 MG per tablet Take 1 tablet by mouth every 4 (four) hours as needed. Patient not taking: Reported on 12/10/2014 11/23/14   Delice Bison Ward,  DO  predniSONE (DELTASONE) 10 MG tablet Take 2 tablets (20 mg total) by mouth daily. Patient not taking: Reported on 12/10/2014 11/23/14   Kristen N Ward, DO   BP 168/90 mmHg  Pulse 91  Temp(Src) 98.1 F (36.7 C) (Oral)  Resp 21  Ht 5\' 8"  (1.727 m)  Wt 265 lb (120.203 kg)  BMI 40.30 kg/m2  SpO2 95% Physical Exam  Constitutional: She is oriented to person, place, and time. She appears well-developed and well-nourished. No distress.  HENT:  Head: Normocephalic and atraumatic.  Mouth/Throat: Oropharynx is clear and moist.  Eyes: Conjunctivae are normal. Pupils are equal, round, and reactive to light. No scleral icterus.  Neck: Neck supple.  Cardiovascular: Normal rate, regular rhythm, normal heart sounds and intact distal pulses.   No murmur heard. Pulmonary/Chest: Effort normal. No stridor. No respiratory distress. She has wheezes (diffuse end expiratory.). She has no rales. She exhibits tenderness (left lateral chest wall).  Abdominal: Soft. Bowel sounds are normal. She exhibits no distension. There is no tenderness.  Musculoskeletal: Normal range of motion. She exhibits edema (1+ BLE).  Neurological: She is alert and oriented to person, place, and time.  Skin: Skin is warm and dry. No rash noted.  Psychiatric: She has a normal mood and affect. Her behavior is normal.  Nursing note and vitals reviewed.   ED Course  Procedures (including critical care time) Labs Review Labs Reviewed  CBC - Abnormal; Notable for the following:    RBC 5.27 (*)    Hemoglobin 15.1 (*)    HCT 48.2 (*)    All other components within normal limits  BASIC METABOLIC PANEL - Abnormal; Notable for the following:    Glucose, Bld 115 (*)    GFR calc non Af Amer 63 (*)    GFR calc Af Amer 73 (*)    All other components within normal limits  D-DIMER, QUANTITATIVE  BRAIN NATRIURETIC PEPTIDE  I-STAT TROPOININ, ED    Imaging Review Dg Chest 2 View  12/10/2014   CLINICAL DATA:  RIGHT chest pain radiating  to the back for a week. Recent diagnosis of pneumonia.  EXAM: CHEST  2 VIEW  COMPARISON:  Chest radiograph November 23, 2014  FINDINGS: Mild cardiomegaly, mediastinal silhouette is nonsuspicious. LEFT midlung zone strandy densities without pleural effusion. No pneumothorax. Large body habitus. Moderate degenerative change of thoracic spine.  IMPRESSION: Stable cardiomegaly.  LEFT lung base atelectasis.   Electronically Signed   By: Elon Alas   On: 12/10/2014 06:14  All radiology studies independently viewed by me.      EKG Interpretation   Date/Time:  Thursday December 10 2014 05:27:44 EDT Ventricular Rate:  92 PR Interval:  144 QRS Duration: 83 QT Interval:  395 QTC Calculation: 489 R Axis:   -40 Text Interpretation:  Sinus rhythm Probable left atrial enlargement Left  axis deviation Probable anteroseptal infarct, old Baseline wander in  lead(s) II III aVF When compared with ECG of 11/23/2014, No significant  change was found Confirmed by [facility]  MD, DAVID ([id number]) on 12/10/2014  5:36:22 AM      MDM   Final diagnoses:  Left sided chest pain    54 yo female presenting with left sided chest pain.  Seen for similar a few weeks ago.  Has had persistent pain, worse with deep inspiration or palpation of chest wall.  Plan DDimer, BNP, toradol IV.    Labs negative.  Pain better with toradol.  Clinical picture not consistent with ACS, PE, or dissection.  I suspect a MSK cause of her pain, likely exacerbated by cough/wheezing.  Advised she take NSAIDs, use albuterol.  Pt will follow up closely with her PCP .    Serita Grit, MD 12/10/14 [phone]

## 2014-12-10 NOTE — ED Notes (Signed)
Pt states she is having pain in the right chest that radiates into her back  Pt states she has had this pain for months and has been increasingly short of breath for the past week  Pt states she was recently diagnosed with pneumonia and was placed on antibiotics, steroids, pain medication, and an inhaler   Pt states she was told she had inflammation of her lungs

## 2014-12-10 NOTE — ED Notes (Signed)
Returned from xray

## 2014-12-10 NOTE — ED Notes (Signed)
HAS PCP APPT TODAY AT 11:30

## 2014-12-10 NOTE — Discharge Instructions (Signed)
Chest Pain (Nonspecific) °It is often hard to give a specific diagnosis for the cause of chest pain. There is always a chance that your pain could be related to something serious, such as a heart attack or a blood clot in the lungs. You need to follow up with your health care provider for further evaluation. °CAUSES  °· Heartburn. °· Pneumonia or bronchitis. °· Anxiety or stress. °· Inflammation around your heart (pericarditis) or lung (pleuritis or pleurisy). °· A blood clot in the lung. °· A collapsed lung (pneumothorax). It can develop suddenly on its own (spontaneous pneumothorax) or from trauma to the chest. °· Shingles infection (herpes zoster virus). °The chest wall is composed of bones, muscles, and cartilage. Any of these can be the source of the pain. °· The bones can be bruised by injury. °· The muscles or cartilage can be strained by coughing or overwork. °· The cartilage can be affected by inflammation and become sore (costochondritis). °DIAGNOSIS  °Lab tests or other studies may be needed to find the cause of your pain. Your health care provider may have you take a test called an ambulatory electrocardiogram (ECG). An ECG records your heartbeat patterns over a 24-hour period. You may also have other tests, such as: °· Transthoracic echocardiogram (TTE). During echocardiography, sound waves are used to evaluate how blood flows through your heart. °· Transesophageal echocardiogram (TEE). °· Cardiac monitoring. This allows your health care provider to monitor your heart rate and rhythm in real time. °· Holter monitor. This is a portable device that records your heartbeat and can help diagnose heart arrhythmias. It allows your health care provider to track your heart activity for several days, if needed. °· Stress tests by exercise or by giving medicine that makes the heart beat faster. °TREATMENT  °· Treatment depends on what may be causing your chest pain. Treatment may include: °· Acid blockers for  heartburn. °· Anti-inflammatory medicine. °· Pain medicine for inflammatory conditions. °· Antibiotics if an infection is present. °· You may be advised to change lifestyle habits. This includes stopping smoking and avoiding alcohol, caffeine, and chocolate. °· You may be advised to keep your head raised (elevated) when sleeping. This reduces the chance of acid going backward from your stomach into your esophagus. °Most of the time, nonspecific chest pain will improve within 2-3 days with rest and mild pain medicine.  °HOME CARE INSTRUCTIONS  °· If antibiotics were prescribed, take them as directed. Finish them even if you start to feel better. °· For the next few days, avoid physical activities that bring on chest pain. Continue physical activities as directed. °· Do not use any tobacco products, including cigarettes, chewing tobacco, or electronic cigarettes. °· Avoid drinking alcohol. °· Only take medicine as directed by your health care provider. °· Follow your health care provider's suggestions for further testing if your chest pain does not go away. °· Keep any follow-up appointments you made. If you do not go to an appointment, you could develop lasting (chronic) problems with pain. If there is any problem keeping an appointment, call to reschedule. °SEEK MEDICAL CARE IF:  °· Your chest pain does not go away, even after treatment. °· You have a rash with blisters on your chest. °· You have a fever. °SEEK IMMEDIATE MEDICAL CARE IF:  °· You have increased chest pain or pain that spreads to your arm, neck, jaw, back, or abdomen. °· You have shortness of breath. °· You have an increasing cough, or you cough   up blood. °· You have severe back or abdominal pain. °· You feel nauseous or vomit. °· You have severe weakness. °· You faint. °· You have chills. °This is an emergency. Do not wait to see if the pain will go away. Get medical help at once. Call your local emergency services (911 in [place] Do not drive  yourself to the hospital. °MAKE SURE YOU:  °· Understand these instructions. °· Will watch your condition. °· Will get help right away if you are not doing well or get worse. °Document Released: 06/21/2005 Document Revised: 09/16/2013 Document Reviewed: 04/16/2008 °ExitCare® Patient Information ©2015 [employer]. This information is not intended to replace advice given to you by your health care provider. Make sure you discuss any questions you have with your health care provider. ° °Chest Wall Pain °Chest wall pain is pain in or around the bones and muscles of your chest. It may take up to 6 weeks to get better. It may take longer if you must stay physically active in your work and activities.  °CAUSES  °Chest wall pain may happen on its own. However, it may be caused by: °· A viral illness like the flu. °· Injury. °· Coughing. °· Exercise. °· Arthritis. °· Fibromyalgia. °· Shingles. °HOME CARE INSTRUCTIONS  °· Avoid overtiring physical activity. Try not to strain or perform activities that cause pain. This includes any activities using your chest or your abdominal and side muscles, especially if heavy weights are used. °· Put ice on the sore area. °¨ Put ice in a plastic bag. °¨ Place a towel between your skin and the bag. °¨ Leave the ice on for 15-20 minutes per hour while awake for the first 2 days. °· Only take over-the-counter or prescription medicines for pain, discomfort, or fever as directed by your caregiver. °SEEK IMMEDIATE MEDICAL CARE IF:  °· Your pain increases, or you are very uncomfortable. °· You have a fever. °· Your chest pain becomes worse. °· You have new, unexplained symptoms. °· You have nausea or vomiting. °· You feel sweaty or lightheaded. °· You have a cough with phlegm (sputum), or you cough up blood. °MAKE SURE YOU:  °· Understand these instructions. °· Will watch your condition. °· Will get help right away if you are not doing well or get worse. °Document Released: 09/11/2005 Document  Revised: 12/04/2011 Document Reviewed: 05/08/2011 °ExitCare® Patient Information ©2015 [employer]. This information is not intended to replace advice given to you by your health care provider. Make sure you discuss any questions you have with your health care provider. ° °

## 2015-10-06 IMAGING — CR DG CHEST 2V
2 series · 2 of 2 positions shown · non-contrast
Comparison: Chest radiograph November 23, 2014

CLINICAL DATA: RIGHT chest pain radiating to the back for a week.
Recent diagnosis of pneumonia.

EXAM:
CHEST  2 VIEW

[w chest pa]
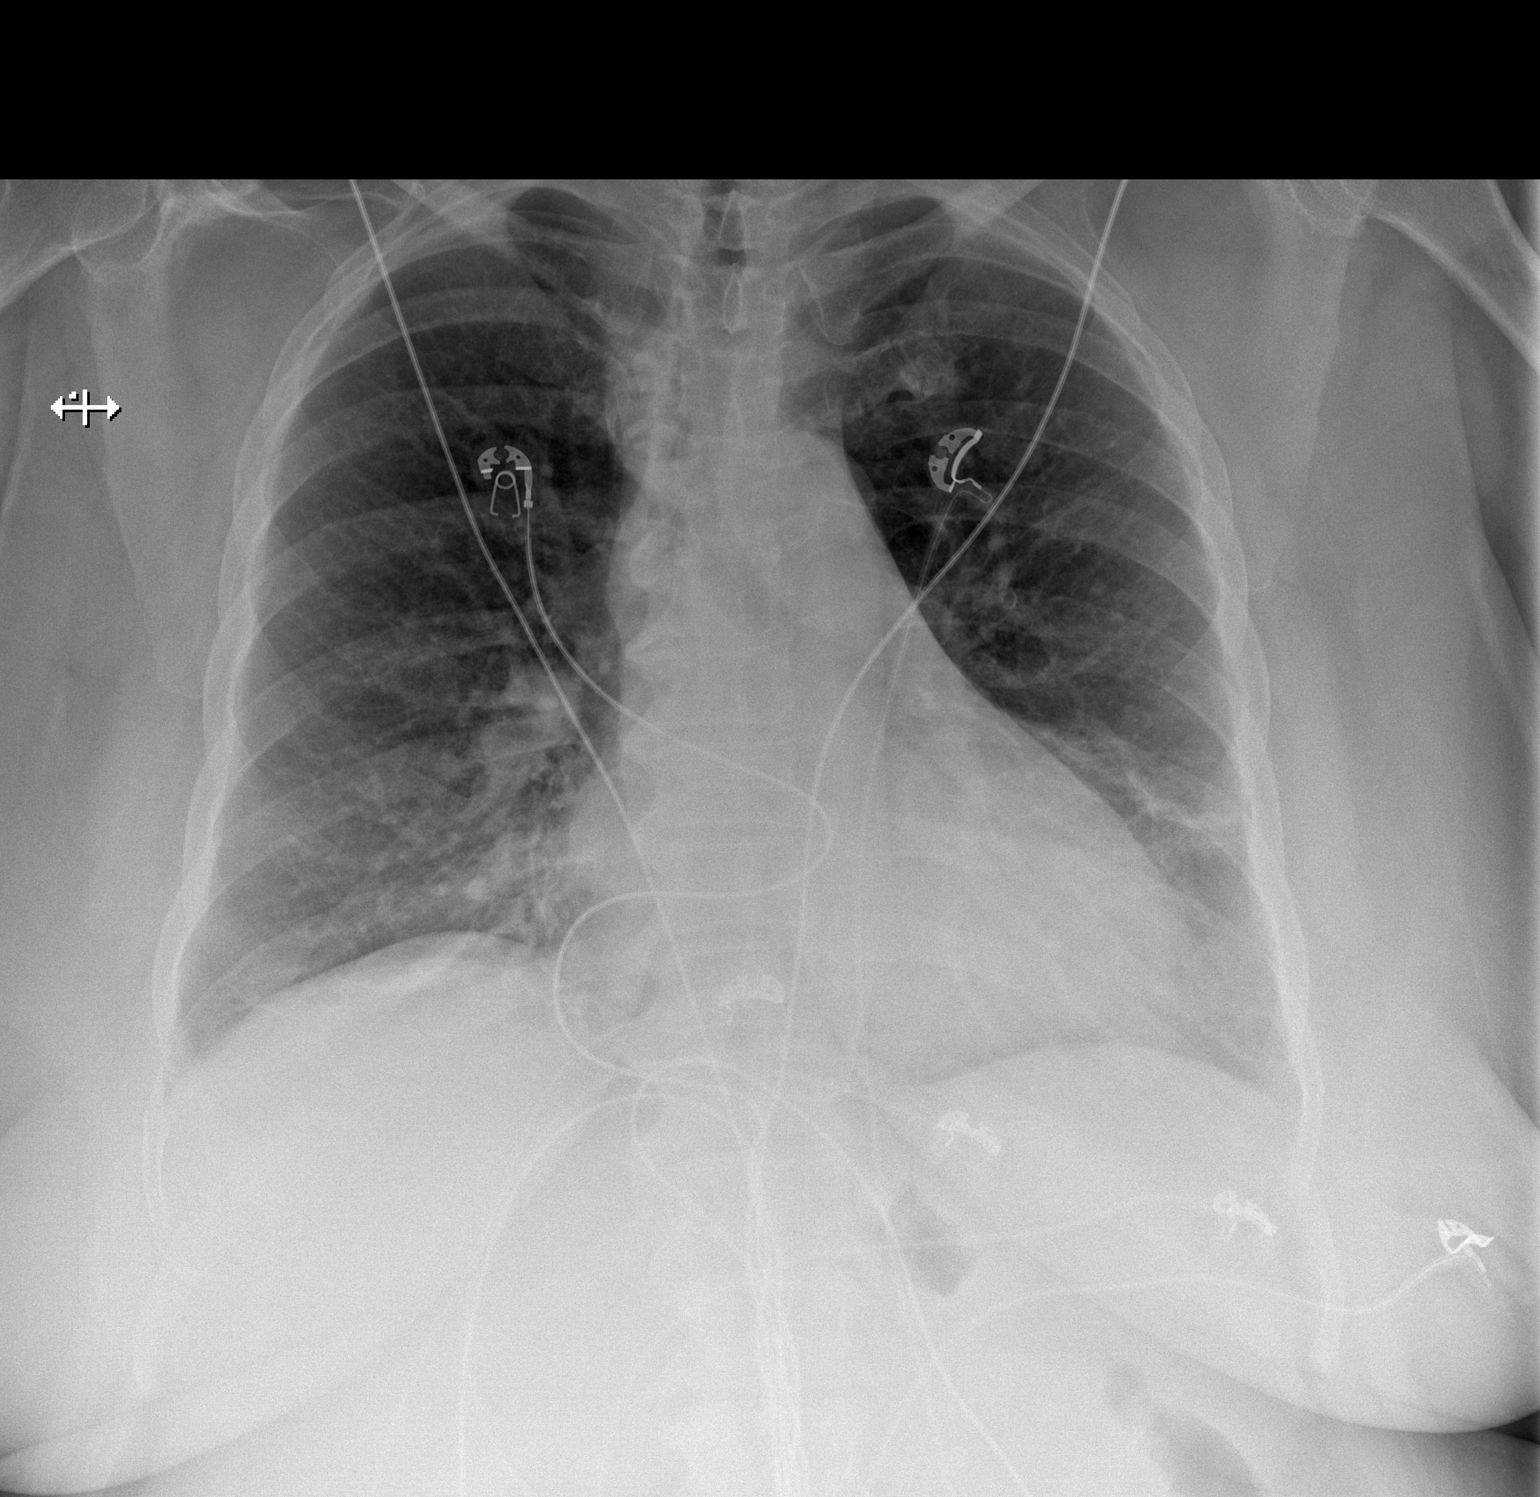

[w chest lat]
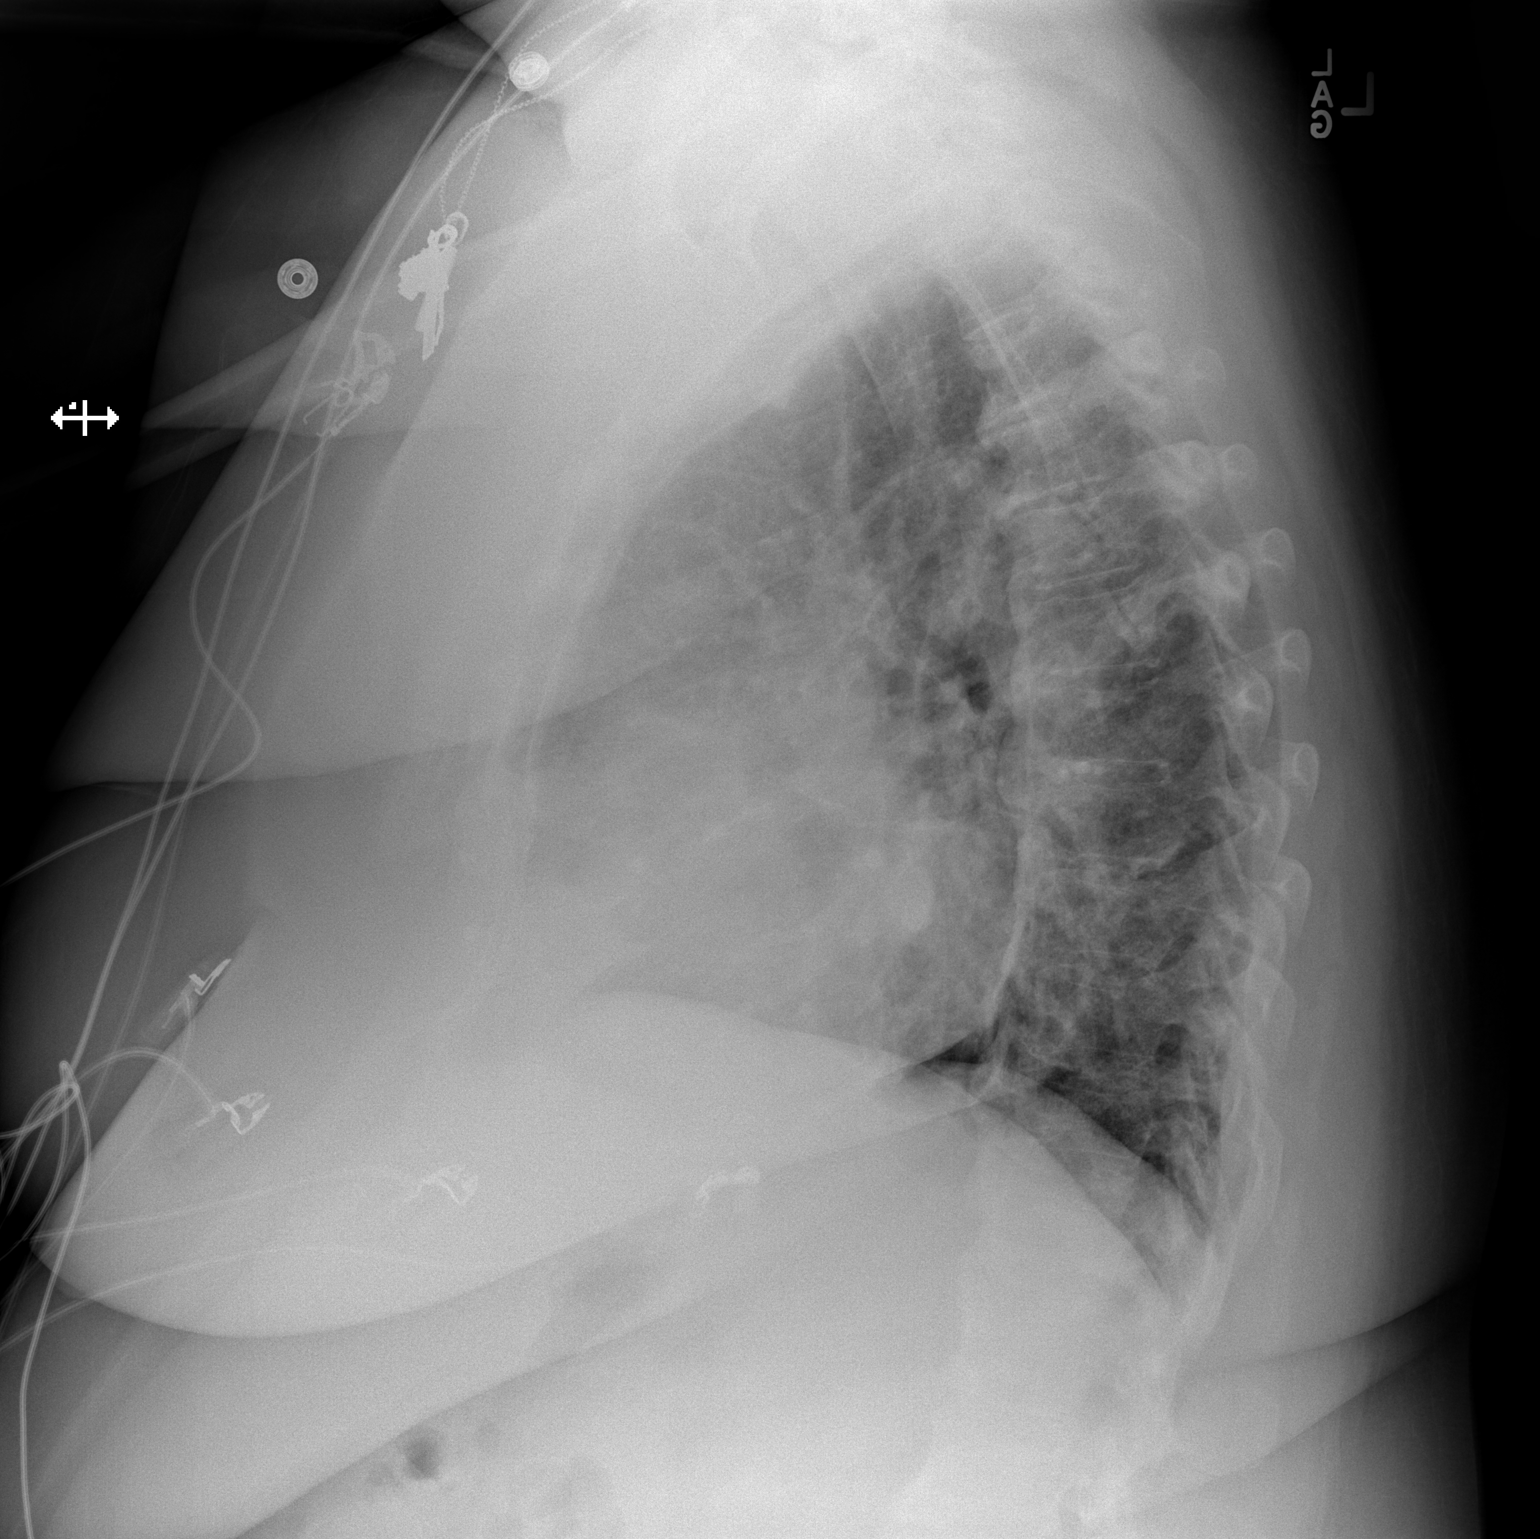

[2 of 2 positions shown; findings below may reference images not displayed]

FINDINGS: Mild cardiomegaly, mediastinal silhouette is nonsuspicious. LEFT
midlung zone strandy densities without pleural effusion. No
pneumothorax. Large body habitus. Moderate degenerative change of
thoracic spine.
IMPRESSION: Stable cardiomegaly.  LEFT lung base atelectasis.

  By: Hipsy Ollennu

## 2016-01-12 ENCOUNTER — Other Ambulatory Visit: Payer: Self-pay | Admitting: Internal Medicine

## 2016-01-12 DIAGNOSIS — Z1231 Encounter for screening mammogram for malignant neoplasm of breast: Secondary | ICD-10-CM

## 2016-01-12 DIAGNOSIS — E2839 Other primary ovarian failure: Secondary | ICD-10-CM

## 2016-04-03 DIAGNOSIS — F17210 Nicotine dependence, cigarettes, uncomplicated: Secondary | ICD-10-CM | POA: Insufficient documentation

## 2016-04-03 DIAGNOSIS — Z8711 Personal history of peptic ulcer disease: Secondary | ICD-10-CM | POA: Insufficient documentation

## 2016-04-13 ENCOUNTER — Encounter (HOSPITAL_COMMUNITY): Payer: Self-pay

## 2016-04-13 ENCOUNTER — Emergency Department (HOSPITAL_COMMUNITY): Payer: BC Managed Care – PPO

## 2016-04-13 ENCOUNTER — Emergency Department (HOSPITAL_COMMUNITY)
Admission: EM | Admit: 2016-04-13 | Discharge: 2016-04-13 | Disposition: A | Discharge status: Home / Self Care | Payer: BC Managed Care – PPO | Attending: Emergency Medicine | Admitting: Emergency Medicine

## 2016-04-13 DIAGNOSIS — I10 Essential (primary) hypertension: Secondary | ICD-10-CM | POA: Diagnosis not present

## 2016-04-13 DIAGNOSIS — Z79899 Other long term (current) drug therapy: Secondary | ICD-10-CM | POA: Insufficient documentation

## 2016-04-13 DIAGNOSIS — F17210 Nicotine dependence, cigarettes, uncomplicated: Secondary | ICD-10-CM | POA: Diagnosis not present

## 2016-04-13 DIAGNOSIS — Z7984 Long term (current) use of oral hypoglycemic drugs: Secondary | ICD-10-CM | POA: Diagnosis not present

## 2016-04-13 DIAGNOSIS — J029 Acute pharyngitis, unspecified: Secondary | ICD-10-CM | POA: Diagnosis present

## 2016-04-13 DIAGNOSIS — J45909 Unspecified asthma, uncomplicated: Secondary | ICD-10-CM | POA: Insufficient documentation

## 2016-04-13 DIAGNOSIS — J40 Bronchitis, not specified as acute or chronic: Secondary | ICD-10-CM | POA: Insufficient documentation

## 2016-04-13 LAB — COMPREHENSIVE METABOLIC PANEL
ALBUMIN: 4.1 g/dL (ref 3.5–5.0)
ALK PHOS: 85 U/L (ref 38–126)
ALT: 68 U/L — ABNORMAL HIGH (ref 14–54)
ANION GAP: 8 (ref 5–15)
AST: 25 U/L (ref 15–41)
BUN: 9 mg/dL (ref 6–20)
CALCIUM: 9.3 mg/dL (ref 8.9–10.3)
CHLORIDE: 99 mmol/L — AB (ref 101–111)
CO2: 31 mmol/L (ref 22–32)
Creatinine, Ser: 0.66 mg/dL (ref 0.44–1.00)
GFR calc Af Amer: >60 mL/min (ref >60)
GFR calc non Af Amer: >60 mL/min (ref >60)
GLUCOSE: 119 mg/dL — AB (ref 65–99)
POTASSIUM: 3.8 mmol/L (ref 3.5–5.1)
SODIUM: 138 mmol/L (ref 135–145)
Total Bilirubin: 0.8 mg/dL (ref 0.3–1.2)
Total Protein: 8 g/dL (ref 6.5–8.1)

## 2016-04-13 LAB — CBC WITH DIFFERENTIAL/PLATELET
Basophils Absolute: 0 10*3/uL (ref 0.0–0.1)
Basophils Relative: 0 %
Eosinophils Absolute: 0.3 10*3/uL (ref 0.0–0.7)
Eosinophils Relative: 4 %
HEMATOCRIT: 47.7 % — AB (ref 36.0–46.0)
HEMOGLOBIN: 15.2 g/dL — AB (ref 12.0–15.0)
LYMPHS ABS: 1.9 10*3/uL (ref 0.7–4.0)
LYMPHS PCT: 25 %
MCH: 28.2 pg (ref 26.0–34.0)
MCHC: 31.9 g/dL (ref 30.0–36.0)
MCV: 88.5 fL (ref 78.0–100.0)
MONO ABS: 0.7 10*3/uL (ref 0.1–1.0)
MONOS PCT: 9 %
NEUTROS ABS: 4.7 10*3/uL (ref 1.7–7.7)
Neutrophils Relative %: 62 %
Platelets: 206 10*3/uL (ref 150–400)
RBC: 5.39 MIL/uL — ABNORMAL HIGH (ref 3.87–5.11)
RDW: 14.9 % (ref 11.5–15.5)
WBC: 7.5 10*3/uL (ref 4.0–10.5)

## 2016-04-13 LAB — I-STAT CG4 LACTIC ACID, ED: LACTIC ACID, VENOUS: 1.18 mmol/L (ref 0.5–1.9)

## 2016-04-13 LAB — RAPID STREP SCREEN (MED CTR MEBANE ONLY): Streptococcus, Group A Screen (Direct): NEGATIVE

## 2016-04-13 MED ORDER — ALBUTEROL SULFATE (2.5 MG/3ML) 0.083% IN NEBU
5.0000 mg | INHALATION_SOLUTION | Freq: Once | RESPIRATORY_TRACT | Status: AC
  Administered 2016-04-13: 5 mg via RESPIRATORY_TRACT
  Filled 2016-04-13: qty 6

## 2016-04-13 MED ORDER — PREDNISONE 20 MG PO TABS: ORAL_TABLET | ORAL | Status: DC

## 2016-04-13 MED ORDER — PREDNISONE 20 MG PO TABS
60.0000 mg | ORAL_TABLET | Freq: Once | ORAL | Status: AC
  Administered 2016-04-13: 60 mg via ORAL
  Filled 2016-04-13: qty 3

## 2016-04-13 MED ORDER — SODIUM CHLORIDE 0.9 % IV BOLUS (SEPSIS): 1000.0000 mL | Freq: Once | INTRAVENOUS | Status: DC

## 2016-04-13 MED ORDER — DEXTROSE 5 % IV SOLN: 1.0000 g | INTRAVENOUS | Status: DC

## 2016-04-13 MED ORDER — BENZONATATE 100 MG PO CAPS: 100.0000 mg | ORAL_CAPSULE | Freq: Three times a day (TID) | ORAL | Status: DC

## 2016-04-13 MED ORDER — DEXTROSE 5 % IV SOLN
1.0000 g | Freq: Once | INTRAVENOUS | Status: AC
  Administered 2016-04-13: 1 g via INTRAVENOUS
  Filled 2016-04-13: qty 10

## 2016-04-13 MED ORDER — DEXTROSE 5 % IV SOLN
500.0000 mg | Freq: Once | INTRAVENOUS | Status: AC
  Administered 2016-04-13: 500 mg via INTRAVENOUS
  Filled 2016-04-13: qty 500

## 2016-04-13 MED ORDER — ALBUTEROL SULFATE HFA 108 (90 BASE) MCG/ACT IN AERS
2.0000 | INHALATION_SPRAY | RESPIRATORY_TRACT | Status: DC | PRN
  Administered 2016-04-13: 2 via RESPIRATORY_TRACT
  Filled 2016-04-13: qty 6.7

## 2016-04-13 MED ORDER — DEXTROSE 5 % IV SOLN: 500.0000 mg | INTRAVENOUS | Status: DC

## 2016-04-13 MED ORDER — AZITHROMYCIN 250 MG PO TABS: ORAL_TABLET | ORAL | Status: DC

## 2016-04-13 MED ORDER — BENZONATATE 100 MG PO CAPS
200.0000 mg | ORAL_CAPSULE | Freq: Once | ORAL | Status: AC
  Administered 2016-04-13: 200 mg via ORAL
  Filled 2016-04-13: qty 2

## 2016-04-13 NOTE — ED Notes (Addendum)
Pt presents with c/o cough, sore throat, and eye drainage. Pt reports her symptoms started approx one week ago. Pt reports she feels pain in her chest when she coughs and feels a "warm sensation" in her chest with coughing. Pt reporting pain with swallowing. Pt's O2 sats 87-90% on RA, improved to 94% with 2L of O2 via [place].

## 2016-04-13 NOTE — ED Notes (Signed)
Pt.ambulated out in the hall and back to her room on 93% room air and heart rate 114. pt. Gait unsteady on her feet.

## 2016-04-13 NOTE — ED Provider Notes (Signed)
CSN: [id number]     Arrival date & time 04/13/16  1722 History   First MD Initiated Contact with Patient 04/13/16 1848     Chief Complaint  Patient presents with  . Sore Throat  . Cough     (Consider location/radiation/quality/duration/timing/severity/associated sxs/prior Treatment) HPI   55 year old obese female with history of hypertension, asthma and GERD presenting with complaints of cough and sore throat. Patient report for the past week she has had fever, chills, headache, eye pain, ear pain, sore throat, cough productive with green sputum and specks of blood, pleuritic chest pain and shortness of breath. Symptom has been persistent, moderate in severity, not improve despite using over-the-counter medication such as Alka-Seltzer, Mucinex and Tylenol. She report symptoms felt similar to prior bronchitis that she had in the past. She is an occasional smoker. She denies any prior history of PE or DVT, no recent surgery, prolonged bed rest, unilateral leg swelling or calf pain, active cancer. She denies any recent sick contact. She does not wear home oxygenation. She arrived to the ER and received a course of albuterol nebs treatment prior to my evaluation but states it provides no relief.  Past Medical History  Diagnosis Date  . Acid reflux   . Hypertension   . Fibroids 11/08/2011  . Obesity (BMI 30-39.9) 11/08/2011  . Fatty liver 11/08/2011  . Pancreatitis 11/08/2011  . Ventral hernia   . Asthma    Past Surgical History  Procedure Laterality Date  . Orthopedic surgery      R ankle, tendonitis   Family History  Problem Relation Age of Onset  . Hypertension Mother   . Diabetes Other    Social History  Substance Use Topics  . Smoking status: Current Every Day Smoker -- 0.50 packs/day    Types: Cigarettes  . Smokeless tobacco: Never Used  . Alcohol Use: No   OB History    Gravida Para Term Preterm AB TAB SAB Ectopic Multiple Living   0 0 0 0 0 0 0 0 0 0      Review of  Systems  All other systems reviewed and are negative.     Allergies  Aspirin  Home Medications   Prior to Admission medications   Medication Sig Start Date End Date Taking? Authorizing Provider  albuterol (PROVENTIL) (2.5 MG/3ML) 0.083% nebulizer solution Take 3 mLs (2.5 mg total) by nebulization every 2 (two) hours as needed for wheezing or shortness of breath. 08/22/14  Yes Hosie Poisson, MD  albuterol (PROVENTIL HFA;VENTOLIN HFA) 108 (90 BASE) MCG/ACT inhaler Inhale 2 puffs into the lungs every 4 (four) hours as needed for wheezing or shortness of breath. Patient not taking: Reported on 04/13/2016 11/23/14   Kristen N Ward, DO  amLODipine (NORVASC) 10 MG tablet Take 1 tablet (10 mg total) by mouth daily. Patient not taking: Reported on 04/13/2016 08/22/14   Hosie Poisson, MD  cloNIDine (CATAPRES) 0.1 MG tablet Take 1 tablet (0.1 mg total) by mouth 3 (three) times daily. Patient not taking: Reported on 04/13/2016 08/22/14   Hosie Poisson, MD  furosemide (LASIX) 20 MG tablet Take 1 tablet (20 mg total) by mouth daily. Patient not taking: Reported on 04/13/2016 08/22/14   Hosie Poisson, MD  hydrALAZINE (APRESOLINE) 50 MG tablet Take 1 tablet (50 mg total) by mouth every 8 (eight) hours. Patient not taking: Reported on 04/13/2016 08/22/14   Hosie Poisson, MD  ipratropium-albuterol (DUONEB) 0.5-2.5 (3) MG/3ML SOLN Take 3 mLs by nebulization every 6 (six) hours. Patient not taking:  Reported on 04/13/2016 08/22/14   Hosie Poisson, MD  metFORMIN (GLUCOPHAGE) 1000 MG tablet Take 1,000 mg by mouth daily. Reported on 04/13/2016    Historical Provider, MD  omeprazole (PRILOSEC) 20 MG capsule Take 1 capsule (20 mg total) by mouth daily. Patient not taking: Reported on 04/13/2016 08/22/14   Hosie Poisson, MD   BP 173/92 mmHg  Pulse 106  Temp(Src) 98.9 F (37.2 C) (Oral)  Resp 20  SpO2 96% Physical Exam  Constitutional: She appears well-developed and well-nourished. No distress.  Afro-American female,  appears uncomfortable but nontoxic.  HENT:  Head: Atraumatic.  Ears: TMs are erythematous bilaterally Nose: Mild rhinorrhea Throat: Uvula is midline. No tonsillar enlargement or exudates, posterior oropharyngeal erythema no trismus  Eyes: Conjunctivae are normal.  Eyes are injected bilaterally  Neck: Neck supple.  No nuchal rigidity  Cardiovascular:  Tachycardia without murmurs rubs gallops  Pulmonary/Chest:  Scattered rhonchi with rales on the left lung  Abdominal: Soft. There is no tenderness.  Musculoskeletal: She exhibits no edema.  Neurological: She is alert.  Skin: No rash noted.  Psychiatric: She has a normal mood and affect.  Nursing note and vitals reviewed.   ED Course  Procedures (including critical care time) Labs Review Labs Reviewed  COMPREHENSIVE METABOLIC PANEL - Abnormal; Notable for the following:    Chloride 99 (*)    Glucose, Bld 119 (*)    ALT 68 (*)    All other components within normal limits  CBC WITH DIFFERENTIAL/PLATELET - Abnormal; Notable for the following:    RBC 5.39 (*)    Hemoglobin 15.2 (*)    HCT 47.7 (*)    All other components within normal limits  RAPID STREP SCREEN (NOT AT [facility])  CULTURE, GROUP A STREP ([place])  I-STAT CG4 LACTIC ACID, ED    Imaging Review Dg Chest 2 View  04/13/2016  CLINICAL DATA:  Cough and shortness of breath for 1 week. EXAM: CHEST  2 VIEW COMPARISON:  12/10/2014 FINDINGS: The cardiac silhouette, mediastinal and hilar contours are within normal limits and stable. The lungs demonstrate mild chronic bronchitic changes but no definite infiltrates or effusions. The bony thorax is intact and appears stable. IMPRESSION: Chronic bronchitic type interstitial lung changes but no acute overlying pulmonary process. Electronically Signed   By: Marijo Sanes M.D.   On: 04/13/2016 19:03   I have personally reviewed and evaluated these images and lab results as part of my medical decision-making.   EKG Interpretation None      ED ECG REPORT   Date: 04/13/2016  Rate:106  Rhythm: sinus tachycardia  QRS Axis: left  Intervals: normal  ST/T Wave abnormalities: nonspecific T wave changes  Conduction Disutrbances:none  Narrative Interpretation:   Old EKG Reviewed: unchanged  I have personally reviewed the EKG tracing and agree with the computerized printout as noted.   MDM   Final diagnoses:  Bronchitis    BP 181/95 mmHg  Pulse 102  Temp(Src) 99.4 F (37.4 C) (Rectal)  Resp 19  SpO2 98% The patient was noted to be hypertensive today in the emergency department. I have spoken with the patient regarding hypertension and the need for improved management. I instructed the patient to followup with the Primary care doctor within 4 days to improve the management of the patient's hypertension. I also counseled the patient regarding the signs and symptoms which would require an emergent visit to an emergency department for hypertensive urgency and/or hypertensive emergency. The patient understood the need for improved hypertensive management.  7:06 PM Patient here with symptoms most suggestive of a viral etiology however she does run a fever and she does have some rales on lung with scattered rhonchi concerning for pneumonia.  9:05 PM Patient initially has an O2 sats of 87% on room air improves with 2 L of O2. After receiving several doses of breathing treatments and cough medication, she is able to emanate while maintaining greater than 90% oxygenation on room air. Her labs are reassuring, normal lactic acid. Chest x-ray show prior bronchitis manifestation without any acute infiltrate concerning for pneumonia. Her constellation of sxs is suggestive of infectious pulmonary disease and less likely PE or ACS.  Plan to discharge pt with treatment for bronchitis, including prednisone, cough suppressant, albuterol inhaler.  Return precaution discussed.  Care discussed with Dr. Tomi Bamberger. I will also prescribed Z-pak to cover  for potential atypical infection given the duration of her sickness.      Domenic Moras, PA-C 04/13/16 2112  Dorie Rank, MD 04/13/16 2122

## 2016-04-13 NOTE — Discharge Instructions (Signed)
Please use albuterol inhaler 2 puffs every 4 hrs as needed for wheezing and shortness of breath.  Take Zpak for the full duration.  Take prednisone and tessalon as prescribed.  Follow up with your doctor for further care.  Return if your condition worsen or if you have other concerns.   Acute Bronchitis Bronchitis is inflammation of the airways that extend from the windpipe into the lungs (bronchi). The inflammation often causes mucus to develop. This leads to a cough, which is the most common symptom of bronchitis.  In acute bronchitis, the condition usually develops suddenly and goes away over time, usually in a couple weeks. Smoking, allergies, and asthma can make bronchitis worse. Repeated episodes of bronchitis may cause further lung problems.  CAUSES Acute bronchitis is most often caused by the same virus that causes a cold. The virus can spread from person to person (contagious) through coughing, sneezing, and touching contaminated objects. SIGNS AND SYMPTOMS   Cough.   Fever.   Coughing up mucus.   Body aches.   Chest congestion.   Chills.   Shortness of breath.   Sore throat.  DIAGNOSIS  Acute bronchitis is usually diagnosed through a physical exam. Your health care provider will also ask you questions about your medical history. Tests, such as chest X-rays, are sometimes done to rule out other conditions.  TREATMENT  Acute bronchitis usually goes away in a couple weeks. Oftentimes, no medical treatment is necessary. Medicines are sometimes given for relief of fever or cough. Antibiotic medicines are usually not needed but may be prescribed in certain situations. In some cases, an inhaler may be recommended to help reduce shortness of breath and control the cough. A cool mist vaporizer may also be used to help thin bronchial secretions and make it easier to clear the chest.  HOME CARE INSTRUCTIONS  Get plenty of rest.   Drink enough fluids to keep your urine clear or  pale yellow (unless you have a medical condition that requires fluid restriction). Increasing fluids may help thin your respiratory secretions (sputum) and reduce chest congestion, and it will prevent dehydration.   Take medicines only as directed by your health care provider.  If you were prescribed an antibiotic medicine, finish it all even if you start to feel better.  Avoid smoking and secondhand smoke. Exposure to cigarette smoke or irritating chemicals will make bronchitis worse. If you are a smoker, consider using nicotine gum or skin patches to help control withdrawal symptoms. Quitting smoking will help your lungs heal faster.   Reduce the chances of another bout of acute bronchitis by washing your hands frequently, avoiding people with cold symptoms, and trying not to touch your hands to your mouth, nose, or eyes.   Keep all follow-up visits as directed by your health care provider.  SEEK MEDICAL CARE IF: Your symptoms do not improve after 1 week of treatment.  SEEK IMMEDIATE MEDICAL CARE IF:  You develop an increased fever or chills.   You have chest pain.   You have severe shortness of breath.  You have bloody sputum.   You develop dehydration.  You faint or repeatedly feel like you are going to pass out.  You develop repeated vomiting.  You develop a severe headache. MAKE SURE YOU:   Understand these instructions.  Will watch your condition.  Will get help right away if you are not doing well or get worse.   This information is not intended to replace advice given to you by  your health care provider. Make sure you discuss any questions you have with your health care provider.   Document Released: 10/19/2004 Document Revised: 10/02/2014 Document Reviewed: 03/04/2013 Elsevier Interactive Patient Education [employer].

## 2016-04-13 NOTE — Progress Notes (Signed)
PHARMACY NOTE -Rocephin and Azithromycin   Pharmacy has been asked to dose Ceftriaxone and Azithromycin for CAP. Azithromycin 500mg  IV x 1 and Rocephin 1Gm IV x1 ordered in the ER. Will start Rocephin 1gm IV Q24h and Azithromycin 500mg  IV Q24h.tomorrow.  Will sign off at this time. Please reconsult if a change in clinical status warrants re-evaluation of dosage.  Thanks, Garnet Sierras, PharmD 04/13/2016

## 2016-04-16 LAB — CULTURE, GROUP A STREP (THRC)

## 2016-06-04 ENCOUNTER — Emergency Department (HOSPITAL_COMMUNITY): Payer: BC Managed Care – PPO

## 2016-06-04 ENCOUNTER — Encounter (HOSPITAL_COMMUNITY): Payer: Self-pay | Admitting: Emergency Medicine

## 2016-06-04 ENCOUNTER — Inpatient Hospital Stay (HOSPITAL_COMMUNITY)
Admission: EM | Admit: 2016-06-04 | Discharge: 2016-06-10 | DRG: 190 | Disposition: A | Discharge status: Home / Self Care | Payer: BC Managed Care – PPO | Attending: Internal Medicine | Admitting: Internal Medicine

## 2016-06-04 DIAGNOSIS — Z79899 Other long term (current) drug therapy: Secondary | ICD-10-CM

## 2016-06-04 DIAGNOSIS — Z886 Allergy status to analgesic agent status: Secondary | ICD-10-CM

## 2016-06-04 DIAGNOSIS — I10 Essential (primary) hypertension: Secondary | ICD-10-CM | POA: Diagnosis present

## 2016-06-04 DIAGNOSIS — Z833 Family history of diabetes mellitus: Secondary | ICD-10-CM

## 2016-06-04 DIAGNOSIS — J9811 Atelectasis: Secondary | ICD-10-CM | POA: Diagnosis present

## 2016-06-04 DIAGNOSIS — F17210 Nicotine dependence, cigarettes, uncomplicated: Secondary | ICD-10-CM | POA: Diagnosis present

## 2016-06-04 DIAGNOSIS — Z6841 Body Mass Index (BMI) 40.0 and over, adult: Secondary | ICD-10-CM

## 2016-06-04 DIAGNOSIS — K219 Gastro-esophageal reflux disease without esophagitis: Secondary | ICD-10-CM | POA: Diagnosis present

## 2016-06-04 DIAGNOSIS — J189 Pneumonia, unspecified organism: Secondary | ICD-10-CM | POA: Diagnosis present

## 2016-06-04 DIAGNOSIS — E785 Hyperlipidemia, unspecified: Secondary | ICD-10-CM | POA: Diagnosis present

## 2016-06-04 DIAGNOSIS — Z789 Other specified health status: Secondary | ICD-10-CM

## 2016-06-04 DIAGNOSIS — Z720 Tobacco use: Secondary | ICD-10-CM | POA: Diagnosis not present

## 2016-06-04 DIAGNOSIS — T380X5A Adverse effect of glucocorticoids and synthetic analogues, initial encounter: Secondary | ICD-10-CM | POA: Diagnosis present

## 2016-06-04 DIAGNOSIS — G4733 Obstructive sleep apnea (adult) (pediatric): Secondary | ICD-10-CM | POA: Diagnosis present

## 2016-06-04 DIAGNOSIS — Z8249 Family history of ischemic heart disease and other diseases of the circulatory system: Secondary | ICD-10-CM

## 2016-06-04 DIAGNOSIS — G4710 Hypersomnia, unspecified: Secondary | ICD-10-CM | POA: Diagnosis present

## 2016-06-04 DIAGNOSIS — E1165 Type 2 diabetes mellitus with hyperglycemia: Secondary | ICD-10-CM | POA: Diagnosis present

## 2016-06-04 DIAGNOSIS — J440 Chronic obstructive pulmonary disease with acute lower respiratory infection: Principal | ICD-10-CM | POA: Diagnosis present

## 2016-06-04 DIAGNOSIS — B370 Candidal stomatitis: Secondary | ICD-10-CM | POA: Diagnosis present

## 2016-06-04 DIAGNOSIS — J441 Chronic obstructive pulmonary disease with (acute) exacerbation: Secondary | ICD-10-CM | POA: Diagnosis present

## 2016-06-04 DIAGNOSIS — R0602 Shortness of breath: Secondary | ICD-10-CM | POA: Diagnosis not present

## 2016-06-04 DIAGNOSIS — J449 Chronic obstructive pulmonary disease, unspecified: Secondary | ICD-10-CM

## 2016-06-04 DIAGNOSIS — Z7984 Long term (current) use of oral hypoglycemic drugs: Secondary | ICD-10-CM

## 2016-06-04 DIAGNOSIS — K760 Fatty (change of) liver, not elsewhere classified: Secondary | ICD-10-CM | POA: Diagnosis present

## 2016-06-04 LAB — URINALYSIS, ROUTINE W REFLEX MICROSCOPIC
BILIRUBIN URINE: NEGATIVE
GLUCOSE, UA: NEGATIVE mg/dL
HGB URINE DIPSTICK: NEGATIVE
KETONES UR: NEGATIVE mg/dL
Leukocytes, UA: NEGATIVE
Nitrite: NEGATIVE
Protein, ur: 30 mg/dL — AB
Specific Gravity, Urine: 1.012 (ref 1.005–1.030)
pH: 5.5 (ref 5.0–8.0)

## 2016-06-04 LAB — CBC WITH DIFFERENTIAL/PLATELET
BASOS PCT: 1 %
Basophils Absolute: 0 10*3/uL (ref 0.0–0.1)
EOS ABS: 0.1 10*3/uL (ref 0.0–0.7)
EOS PCT: 2 %
HCT: 46.6 % — ABNORMAL HIGH (ref 36.0–46.0)
Hemoglobin: 14.8 g/dL (ref 12.0–15.0)
LYMPHS ABS: 1.8 10*3/uL (ref 0.7–4.0)
Lymphocytes Relative: 29 %
MCH: 28.2 pg (ref 26.0–34.0)
MCHC: 31.8 g/dL (ref 30.0–36.0)
MCV: 88.8 fL (ref 78.0–100.0)
MONOS PCT: 12 %
Monocytes Absolute: 0.8 10*3/uL (ref 0.1–1.0)
Neutro Abs: 3.5 10*3/uL (ref 1.7–7.7)
Neutrophils Relative %: 56 %
PLATELETS: 214 10*3/uL (ref 150–400)
RBC: 5.25 MIL/uL — ABNORMAL HIGH (ref 3.87–5.11)
RDW: 15.1 % (ref 11.5–15.5)
WBC: 6.3 10*3/uL (ref 4.0–10.5)

## 2016-06-04 LAB — COMPREHENSIVE METABOLIC PANEL
ALK PHOS: 55 U/L (ref 38–126)
ALT: 33 U/L (ref 14–54)
AST: 20 U/L (ref 15–41)
Albumin: 4 g/dL (ref 3.5–5.0)
Anion gap: 9 (ref 5–15)
BUN: 16 mg/dL (ref 6–20)
CALCIUM: 9.2 mg/dL (ref 8.9–10.3)
CHLORIDE: 102 mmol/L (ref 101–111)
CO2: 28 mmol/L (ref 22–32)
CREATININE: 0.66 mg/dL (ref 0.44–1.00)
GFR calc Af Amer: >60 mL/min (ref >60)
Glucose, Bld: 91 mg/dL (ref 65–99)
Potassium: 4 mmol/L (ref 3.5–5.1)
Sodium: 139 mmol/L (ref 135–145)
Total Bilirubin: 0.6 mg/dL (ref 0.3–1.2)
Total Protein: 8.1 g/dL (ref 6.5–8.1)

## 2016-06-04 LAB — GLUCOSE, CAPILLARY
GLUCOSE-CAPILLARY: 212 mg/dL — AB (ref 65–99)
GLUCOSE-CAPILLARY: 210 mg/dL — AB (ref 65–99)
GLUCOSE-CAPILLARY: 290 mg/dL — AB (ref 65–99)

## 2016-06-04 LAB — URINE MICROSCOPIC-ADD ON: BACTERIA UA: NONE SEEN

## 2016-06-04 LAB — I-STAT CG4 LACTIC ACID, ED: Lactic Acid, Venous: 1.33 mmol/L (ref 0.5–1.9)

## 2016-06-04 LAB — STREP PNEUMONIAE URINARY ANTIGEN: STREP PNEUMO URINARY ANTIGEN: NEGATIVE

## 2016-06-04 LAB — TROPONIN I

## 2016-06-04 MED ORDER — LISINOPRIL 2.5 MG PO TABS
2.5000 mg | ORAL_TABLET | Freq: Every day | ORAL | Status: DC
  Administered 2016-06-05 – 2016-06-10 (×6): 2.5 mg via ORAL
  Filled 2016-06-04 (×4): qty 1
  Filled 2016-06-04: qty 0.5
  Filled 2016-06-04: qty 1

## 2016-06-04 MED ORDER — ALBUTEROL (5 MG/ML) CONTINUOUS INHALATION SOLN
10.0000 mg/h | INHALATION_SOLUTION | RESPIRATORY_TRACT | Status: DC
Start: 2016-06-04 — End: 2016-06-04
  Administered 2016-06-04: 10 mg/h via RESPIRATORY_TRACT
  Filled 2016-06-04: qty 20

## 2016-06-04 MED ORDER — DEXTROSE 5 % IV SOLN: 1.0000 g | Freq: Once | INTRAVENOUS | Status: DC

## 2016-06-04 MED ORDER — ONDANSETRON HCL 4 MG/2ML IJ SOLN
4.0000 mg | Freq: Once | INTRAMUSCULAR | Status: AC
  Administered 2016-06-04: 4 mg via INTRAVENOUS
  Filled 2016-06-04: qty 2

## 2016-06-04 MED ORDER — BUDESONIDE 0.25 MG/2ML IN SUSP
0.2500 mg | Freq: Two times a day (BID) | RESPIRATORY_TRACT | Status: DC
  Administered 2016-06-04 – 2016-06-07 (×6): 0.25 mg via RESPIRATORY_TRACT
  Filled 2016-06-04 (×6): qty 2

## 2016-06-04 MED ORDER — DEXTROSE 5 % IV SOLN
500.0000 mg | INTRAVENOUS | Status: DC
  Administered 2016-06-04 – 2016-06-06 (×3): 500 mg via INTRAVENOUS
  Filled 2016-06-04 (×4): qty 500

## 2016-06-04 MED ORDER — MORPHINE SULFATE (PF) 4 MG/ML IV SOLN
4.0000 mg | Freq: Once | INTRAVENOUS | Status: AC
  Administered 2016-06-04: 4 mg via INTRAVENOUS
  Filled 2016-06-04: qty 1

## 2016-06-04 MED ORDER — IPRATROPIUM-ALBUTEROL 0.5-2.5 (3) MG/3ML IN SOLN
3.0000 mL | Freq: Four times a day (QID) | RESPIRATORY_TRACT | Status: DC
  Administered 2016-06-04: 3 mL via RESPIRATORY_TRACT
  Filled 2016-06-04: qty 3

## 2016-06-04 MED ORDER — INSULIN ASPART 100 UNIT/ML ~~LOC~~ SOLN
0.0000 [IU] | Freq: Every day | SUBCUTANEOUS | Status: DC
  Administered 2016-06-04: 3 [IU] via SUBCUTANEOUS
  Administered 2016-06-05 – 2016-06-07 (×3): 4 [IU] via SUBCUTANEOUS
  Administered 2016-06-08 – 2016-06-09 (×2): 3 [IU] via SUBCUTANEOUS

## 2016-06-04 MED ORDER — METHYLPREDNISOLONE SODIUM SUCC 125 MG IJ SOLR
125.0000 mg | Freq: Once | INTRAMUSCULAR | Status: AC
  Administered 2016-06-04: 125 mg via INTRAVENOUS
  Filled 2016-06-04: qty 2

## 2016-06-04 MED ORDER — ACETAMINOPHEN 325 MG PO TABS
650.0000 mg | ORAL_TABLET | Freq: Four times a day (QID) | ORAL | Status: DC | PRN
  Administered 2016-06-04 – 2016-06-08 (×6): 650 mg via ORAL
  Filled 2016-06-04 (×7): qty 2

## 2016-06-04 MED ORDER — LEVOFLOXACIN IN D5W 750 MG/150ML IV SOLN: 750.0000 mg | Freq: Once | INTRAVENOUS | Status: DC

## 2016-06-04 MED ORDER — ALBUTEROL SULFATE (2.5 MG/3ML) 0.083% IN NEBU
2.5000 mg | INHALATION_SOLUTION | Freq: Four times a day (QID) | RESPIRATORY_TRACT | Status: DC | PRN
  Administered 2016-06-04 – 2016-06-09 (×2): 2.5 mg via RESPIRATORY_TRACT
  Filled 2016-06-04 (×2): qty 3

## 2016-06-04 MED ORDER — GUAIFENESIN-DM 100-10 MG/5ML PO SYRP
5.0000 mL | ORAL_SOLUTION | ORAL | Status: DC | PRN
  Administered 2016-06-05 – 2016-06-08 (×5): 5 mL via ORAL
  Filled 2016-06-04 (×7): qty 10

## 2016-06-04 MED ORDER — AZITHROMYCIN 250 MG PO TABS: 500.0000 mg | ORAL_TABLET | Freq: Once | ORAL | Status: DC

## 2016-06-04 MED ORDER — INSULIN ASPART 100 UNIT/ML ~~LOC~~ SOLN
0.0000 [IU] | Freq: Three times a day (TID) | SUBCUTANEOUS | Status: DC
  Administered 2016-06-04 (×2): 3 [IU] via SUBCUTANEOUS
  Administered 2016-06-05: 5 [IU] via SUBCUTANEOUS
  Administered 2016-06-05: 2 [IU] via SUBCUTANEOUS
  Administered 2016-06-05: 7 [IU] via SUBCUTANEOUS
  Administered 2016-06-06: 2 [IU] via SUBCUTANEOUS
  Administered 2016-06-06: 7 [IU] via SUBCUTANEOUS

## 2016-06-04 MED ORDER — PRAVASTATIN SODIUM 20 MG PO TABS
40.0000 mg | ORAL_TABLET | Freq: Every day | ORAL | Status: DC
  Administered 2016-06-04 – 2016-06-10 (×7): 40 mg via ORAL
  Filled 2016-06-04 (×7): qty 2

## 2016-06-04 MED ORDER — DEXTROSE 5 % IV SOLN
1.0000 g | INTRAVENOUS | Status: AC
  Administered 2016-06-04 – 2016-06-10 (×7): 1 g via INTRAVENOUS
  Filled 2016-06-04 (×7): qty 10

## 2016-06-04 MED ORDER — METHYLPREDNISOLONE SODIUM SUCC 125 MG IJ SOLR
60.0000 mg | Freq: Two times a day (BID) | INTRAMUSCULAR | Status: DC
  Administered 2016-06-04 – 2016-06-10 (×11): 60 mg via INTRAVENOUS
  Filled 2016-06-04 (×14): qty 2

## 2016-06-04 MED ORDER — BISACODYL 5 MG PO TBEC
10.0000 mg | DELAYED_RELEASE_TABLET | Freq: Once | ORAL | Status: AC
  Administered 2016-06-04: 10 mg via ORAL
  Filled 2016-06-04: qty 1
  Filled 2016-06-04: qty 2

## 2016-06-04 MED ORDER — ENOXAPARIN SODIUM 80 MG/0.8ML ~~LOC~~ SOLN
0.5000 mg/kg | SUBCUTANEOUS | Status: DC
  Administered 2016-06-04 – 2016-06-10 (×7): 65 mg via SUBCUTANEOUS
  Filled 2016-06-04 (×9): qty 0.8

## 2016-06-04 MED ORDER — IPRATROPIUM-ALBUTEROL 0.5-2.5 (3) MG/3ML IN SOLN
3.0000 mL | Freq: Four times a day (QID) | RESPIRATORY_TRACT | Status: DC
  Administered 2016-06-05 – 2016-06-07 (×9): 3 mL via RESPIRATORY_TRACT
  Filled 2016-06-04 (×9): qty 3

## 2016-06-04 MED ORDER — ALBUTEROL (5 MG/ML) CONTINUOUS INHALATION SOLN
10.0000 mg/h | INHALATION_SOLUTION | RESPIRATORY_TRACT | Status: DC
  Administered 2016-06-04: 10 mg/h via RESPIRATORY_TRACT

## 2016-06-04 MED ORDER — HYDROCHLOROTHIAZIDE 12.5 MG PO CAPS
12.5000 mg | ORAL_CAPSULE | Freq: Every day | ORAL | Status: DC
  Administered 2016-06-05 – 2016-06-10 (×6): 12.5 mg via ORAL
  Filled 2016-06-04 (×6): qty 1

## 2016-06-04 MED ORDER — VERAPAMIL HCL ER 240 MG PO TBCR
240.0000 mg | EXTENDED_RELEASE_TABLET | Freq: Every day | ORAL | Status: DC
  Administered 2016-06-05 – 2016-06-10 (×6): 240 mg via ORAL
  Filled 2016-06-04 (×6): qty 1

## 2016-06-04 NOTE — ED Triage Notes (Signed)
Patient c/o cough, congestion, and right ear pain x 2 weeks. Patient has list of medications including prescriptions she has taken which haven't helped her symptoms.

## 2016-06-04 NOTE — ED Notes (Signed)
Pt ambulated from bed to room door and O2 dropped to 89%RA

## 2016-06-04 NOTE — ED Provider Notes (Signed)
[facility] DEPT Provider Note   CSN: [id number] Arrival date & time: 06/04/16  [id number]     History   Chief Complaint Chief Complaint  Patient presents with  . Nasal Congestion  . Cough    HPI Sharon Swanson is a 55 y.o. female.  Pt said that she has had sob, congestion, and right ear pain for 2 weeks.  She saw her doctor on 9/8 and was put on inhalers, prednisone, guaifenesin, and zithromax.  She is not getting any better.  She said that she can't sleep due to the coughing.  She has pain in her back from coughing.      Past Medical History:  Diagnosis Date  . Acid reflux   . Asthma   . Fatty liver 11/08/2011  . Fibroids 11/08/2011  . Hypertension   . Obesity (BMI 30-39.9) 11/08/2011  . Pancreatitis 11/08/2011  . Ventral hernia     Patient Active Problem List   Diagnosis Date Noted  . CAP (community acquired pneumonia) 08/17/2014  . Acute respiratory failure ([place]) 08/17/2014  . Umbilical hernia [id number]  . Pulmonary nodule, left 08/17/2014  . Constipation, chronic 08/15/2012  . Pancreatitis 11/08/2011  . GERD (gastroesophageal reflux disease) 11/08/2011  . Tobacco abuse 11/08/2011  . Fatty liver 11/08/2011  . Obesity (BMI 30-39.9) 11/08/2011  . Fibroids 11/08/2011    Past Surgical History:  Procedure Laterality Date  . ORTHOPEDIC SURGERY     R ankle, tendonitis    OB History    Gravida Para Term Preterm AB Living   0 0 0 0 0 0   SAB TAB Ectopic Multiple Live Births   0 0 0 0         Home Medications    Prior to Admission medications   Medication Sig Start Date End Date Taking? Authorizing Provider  albuterol (PROVENTIL HFA;VENTOLIN HFA) 108 (90 BASE) MCG/ACT inhaler Inhale 2 puffs into the lungs every 4 (four) hours as needed for wheezing or shortness of breath. 11/23/14  Yes Kristen N Ward, DO  albuterol (PROVENTIL) (2.5 MG/3ML) 0.083% nebulizer solution Take 3 mLs (2.5 mg total) by nebulization every 2 (two) hours as needed for wheezing or  shortness of breath. 08/22/14  Yes Hosie Poisson, MD  guaiFENesin (MUCINEX) 600 MG 12 hr tablet Take 600 mg by mouth 2 (two) times daily. 06/02/16 06/02/17 Yes Historical Provider, MD  hydrochlorothiazide (MICROZIDE) 12.5 MG capsule Take 12.5 mg by mouth daily. 04/27/16  Yes Historical Provider, MD  lisinopril (PRINIVIL,ZESTRIL) 2.5 MG tablet Take 2.5 mg by mouth daily. 04/27/16 04/27/17 Yes Historical Provider, MD  metFORMIN (GLUCOPHAGE) 500 MG tablet Take 500 mg by mouth 2 (two) times daily. 04/27/16 04/27/17 Yes Historical Provider, MD  pravastatin (PRAVACHOL) 40 MG tablet Take 40 mg by mouth daily. 04/27/16 04/27/17 Yes Historical Provider, MD  predniSONE (DELTASONE) 10 MG tablet Take 10 mg by mouth See admin instructions. Prednisone 12 day taper dose started 09/08 06/02/16  Yes Historical Provider, MD  ranitidine (ZANTAC) 150 MG tablet Take 150 mg by mouth 2 (two) times daily. 04/25/16 04/25/17 Yes Historical Provider, MD  verapamil (CALAN-SR) 240 MG CR tablet Take 240 mg by mouth daily. 06/02/16  Yes Historical Provider, MD  amLODipine (NORVASC) 10 MG tablet Take 1 tablet (10 mg total) by mouth daily. Patient not taking: Reported on 04/13/2016 08/22/14   Hosie Poisson, MD  azithromycin (ZITHROMAX Z-PAK) 250 MG tablet 2 po day one, then 1 daily x 4 days Patient not taking: Reported on 06/04/2016 04/13/16  Domenic Moras, PA-C  benzonatate (TESSALON) 100 MG capsule Take 1 capsule (100 mg total) by mouth every 8 (eight) hours. Patient not taking: Reported on 06/04/2016 04/13/16   Domenic Moras, PA-C  cloNIDine (CATAPRES) 0.1 MG tablet Take 1 tablet (0.1 mg total) by mouth 3 (three) times daily. Patient not taking: Reported on 04/13/2016 08/22/14   Hosie Poisson, MD  furosemide (LASIX) 20 MG tablet Take 1 tablet (20 mg total) by mouth daily. Patient not taking: Reported on 04/13/2016 08/22/14   Hosie Poisson, MD  hydrALAZINE (APRESOLINE) 50 MG tablet Take 1 tablet (50 mg total) by mouth every 8 (eight) hours. Patient not taking:  Reported on 04/13/2016 08/22/14   Hosie Poisson, MD  ipratropium-albuterol (DUONEB) 0.5-2.5 (3) MG/3ML SOLN Take 3 mLs by nebulization every 6 (six) hours. Patient not taking: Reported on 04/13/2016 08/22/14   Hosie Poisson, MD  omeprazole (PRILOSEC) 20 MG capsule Take 1 capsule (20 mg total) by mouth daily. Patient not taking: Reported on 04/13/2016 08/22/14   Hosie Poisson, MD  predniSONE (DELTASONE) 20 MG tablet 3 tabs po day one, then 2 tabs daily x 4 days Patient not taking: Reported on 06/04/2016 04/13/16   Domenic Moras, PA-C    Family History Family History  Problem Relation Age of Onset  . Hypertension Mother   . Diabetes Other     Social History Social History  Substance Use Topics  . Smoking status: Current Every Day Smoker    Packs/day: 0.50    Types: Cigarettes  . Smokeless tobacco: Never Used  . Alcohol use No     Allergies   Aspirin   Review of Systems Review of Systems  Constitutional: Positive for fever.  HENT: Positive for ear pain, rhinorrhea and sore throat.   Respiratory: Positive for cough, shortness of breath and wheezing.   Musculoskeletal: Positive for back pain.     Physical Exam Updated Vital Signs BP 150/77   Pulse 99   Temp 98.7 F (37.1 C) (Oral)   Resp 15   Ht 5\' 7"  (1.702 m)   Wt 293 lb (132.9 kg)   SpO2 100%   BMI 45.89 kg/m   Physical Exam  Constitutional: She is oriented to person, place, and time. She appears well-developed and well-nourished.  HENT:  Head: Normocephalic and atraumatic.  Right Ear: External ear normal.  Left Ear: External ear normal.  Nose: Rhinorrhea present.  Eyes: Conjunctivae and EOM are normal. Pupils are equal, round, and reactive to light.  Neck: Normal range of motion. Neck supple.  Cardiovascular: Normal rate, regular rhythm, normal heart sounds and intact distal pulses.   Pulmonary/Chest: She has wheezes.  Abdominal: Soft. Bowel sounds are normal.  Musculoskeletal: Normal range of motion.    Lymphadenopathy:    She has cervical adenopathy.  Neurological: She is alert and oriented to person, place, and time.  Skin: Skin is warm and dry.  Psychiatric: She has a normal mood and affect. Her behavior is normal. Judgment and thought content normal.  Nursing note and vitals reviewed.    ED Treatments / Results  Labs (all labs ordered are listed, but only abnormal results are displayed) Labs Reviewed  CBC WITH DIFFERENTIAL/PLATELET - Abnormal; Notable for the following:       Result Value   RBC 5.25 (*)    HCT 46.6 (*)    All other components within normal limits  URINALYSIS, ROUTINE W REFLEX MICROSCOPIC (NOT AT [facility]) - Abnormal; Notable for the following:    Protein, ur 30 (*)  All other components within normal limits  URINE MICROSCOPIC-ADD ON - Abnormal; Notable for the following:    Squamous Epithelial / LPF 6-30 (*)    All other components within normal limits  COMPREHENSIVE METABOLIC PANEL  TROPONIN I  I-STAT CG4 LACTIC ACID, ED    EKG  EKG Interpretation None       Radiology Dg Chest 2 View  Result Date: 06/04/2016 CLINICAL DATA:  Cough, congestion, shortness of breath for 2 weeks, history of smoking EXAM: CHEST  2 VIEW COMPARISON:  04/13/2016 FINDINGS: Cardiomediastinal silhouette is stable. Mild infrahilar bronchitic changes. There is streaky right base medially atelectasis or early infiltrate. No pulmonary edema. Degenerative changes thoracic spine again noted. IMPRESSION: Streaky right base medially atelectasis or early infiltrate. No pulmonary edema. Mild infrahilar bronchitic changes. Electronically Signed   By: Lahoma Crocker M.D.   On: 06/04/2016 09:46    Procedures Procedures (including critical care time)  Medications Ordered in ED Medications  albuterol (PROVENTIL,VENTOLIN) solution continuous neb (10 mg/hr Nebulization New Bag/Given 06/04/16 0802)  albuterol (PROVENTIL,VENTOLIN) solution continuous neb (not administered)  levofloxacin (LEVAQUIN)  IVPB 750 mg (not administered)  methylPREDNISolone sodium succinate (SOLU-MEDROL) 125 mg/2 mL injection 125 mg (125 mg Intravenous Given 06/04/16 0824)  morphine 4 MG/ML injection 4 mg (4 mg Intravenous Given 06/04/16 0827)  ondansetron (ZOFRAN) injection 4 mg (4 mg Intravenous Given 06/04/16 0815)     Initial Impression / Assessment and Plan / ED Course  I have reviewed the triage vital signs and the nursing notes.  Pertinent labs & imaging results that were available during my care of the patient were reviewed by me and considered in my medical decision making (see chart for details).  Clinical Course   Pt felt well during the continuous neb treatment, but after she came off of it, her oxygen saturations dropped.  They dropped into the upper 80s with walking and she became very SOB.  Pt does smoke, and I spoke with her about stopping.  She is willing after feeling this bad.  Pt appears to have pna on cxr.  I will put her on levaquin as she's been on zithromax for a few days.    Pt d/w Dr. Cruzita Lederer ([facility]) who will admit pt.  Final Clinical Impressions(s) / ED Diagnoses   Final diagnoses:  CAP (community acquired pneumonia)  COPD exacerbation ([place])  Tobacco abuse  Essential hypertension  Failure of outpatient treatment    New Prescriptions New Prescriptions   No medications on file     Isla Pence, MD 06/04/16 1039

## 2016-06-04 NOTE — ED Notes (Signed)
DELAY IN TRANSFER TO MED SURG- CAT/NED IN PROCESS FOR 1 HOUR-

## 2016-06-04 NOTE — ED Notes (Signed)
BLOOD CULTURE X 1 OBTAINED

## 2016-06-04 NOTE — ED Notes (Signed)
Oxygen applied to maintain 02 sats > than 94%

## 2016-06-04 NOTE — H&P (Signed)
History and Physical    Sharon Swanson [id number] DOB: 20-Apr-1961 DOA: 06/04/2016  PCP: Pcp Not In System  Outpatient Specialists:  Patient coming from: home  Chief Complaint: shortness of breath  HPI: Sharon Swanson is a 55 y.o. female with medical history significant of tobacco abuse, obesity, HTN, HLD, DM, presents to the ER with complaints of shortness of breath. Patient has been having a productive cough / congestion / fever and chills for ~2 weeks, saw her PCP 2 days ago and was given prednisone and Azithromycin. Her symptoms have not improved and decided to present to the ED. She denies abdominal pain, has mild nausea but no vomiting. Denies chest pain and palpitations.   ED Course: in the ED patient VSS, CXR with evidence of infiltrate, she has significant wheezing. She was given hour long neb, steroids, levaquin and TRH asked for admission for CAP and probable COPD exacerbation.   Review of Systems: As per HPI otherwise 10 point review of systems negative.   Past Medical History:  Diagnosis Date  . Acid reflux   . Asthma   . Fatty liver 11/08/2011  . Fibroids 11/08/2011  . Hypertension   . Obesity (BMI 30-39.9) 11/08/2011  . Pancreatitis 11/08/2011  . Ventral hernia     Past Surgical History:  Procedure Laterality Date  . ORTHOPEDIC SURGERY     R ankle, tendonitis     reports that she has been smoking Cigarettes.  She has been smoking about 0.50 packs per day. She has never used smokeless tobacco. She reports that she does not drink alcohol or use drugs.  Allergies  Allergen Reactions  . Aspirin     Abdominal pain    Family History  Problem Relation Age of Onset  . Hypertension Mother   . Diabetes Other     Prior to Admission medications   Medication Sig Start Date End Date Taking? Authorizing Provider  albuterol (PROVENTIL HFA;VENTOLIN HFA) 108 (90 BASE) MCG/ACT inhaler Inhale 2 puffs into the lungs every 4 (four) hours as needed for wheezing or shortness  of breath. 11/23/14  Yes Kristen N Ward, DO  albuterol (PROVENTIL) (2.5 MG/3ML) 0.083% nebulizer solution Take 3 mLs (2.5 mg total) by nebulization every 2 (two) hours as needed for wheezing or shortness of breath. 08/22/14  Yes Hosie Poisson, MD  guaiFENesin (MUCINEX) 600 MG 12 hr tablet Take 600 mg by mouth 2 (two) times daily. 06/02/16 06/02/17 Yes Historical Provider, MD  hydrochlorothiazide (MICROZIDE) 12.5 MG capsule Take 12.5 mg by mouth daily. 04/27/16  Yes Historical Provider, MD  lisinopril (PRINIVIL,ZESTRIL) 2.5 MG tablet Take 2.5 mg by mouth daily. 04/27/16 04/27/17 Yes Historical Provider, MD  metFORMIN (GLUCOPHAGE) 500 MG tablet Take 500 mg by mouth 2 (two) times daily. 04/27/16 04/27/17 Yes Historical Provider, MD  pravastatin (PRAVACHOL) 40 MG tablet Take 40 mg by mouth daily. 04/27/16 04/27/17 Yes Historical Provider, MD  predniSONE (DELTASONE) 10 MG tablet Take 10 mg by mouth See admin instructions. Prednisone 12 day taper dose started 09/08 06/02/16  Yes Historical Provider, MD  ranitidine (ZANTAC) 150 MG tablet Take 150 mg by mouth 2 (two) times daily. 04/25/16 04/25/17 Yes Historical Provider, MD  verapamil (CALAN-SR) 240 MG CR tablet Take 240 mg by mouth daily. 06/02/16  Yes Historical Provider, MD  amLODipine (NORVASC) 10 MG tablet Take 1 tablet (10 mg total) by mouth daily. Patient not taking: Reported on 04/13/2016 08/22/14   Hosie Poisson, MD  azithromycin (ZITHROMAX Z-PAK) 250 MG tablet 2 po day one,  then 1 daily x 4 days Patient not taking: Reported on 06/04/2016 04/13/16   Domenic Moras, PA-C  benzonatate (TESSALON) 100 MG capsule Take 1 capsule (100 mg total) by mouth every 8 (eight) hours. Patient not taking: Reported on 06/04/2016 04/13/16   Domenic Moras, PA-C  cloNIDine (CATAPRES) 0.1 MG tablet Take 1 tablet (0.1 mg total) by mouth 3 (three) times daily. Patient not taking: Reported on 04/13/2016 08/22/14   Hosie Poisson, MD  furosemide (LASIX) 20 MG tablet Take 1 tablet (20 mg total) by mouth  daily. Patient not taking: Reported on 04/13/2016 08/22/14   Hosie Poisson, MD  hydrALAZINE (APRESOLINE) 50 MG tablet Take 1 tablet (50 mg total) by mouth every 8 (eight) hours. Patient not taking: Reported on 04/13/2016 08/22/14   Hosie Poisson, MD  ipratropium-albuterol (DUONEB) 0.5-2.5 (3) MG/3ML SOLN Take 3 mLs by nebulization every 6 (six) hours. Patient not taking: Reported on 04/13/2016 08/22/14   Hosie Poisson, MD  omeprazole (PRILOSEC) 20 MG capsule Take 1 capsule (20 mg total) by mouth daily. Patient not taking: Reported on 04/13/2016 08/22/14   Hosie Poisson, MD  predniSONE (DELTASONE) 20 MG tablet 3 tabs po day one, then 2 tabs daily x 4 days Patient not taking: Reported on 06/04/2016 04/13/16   Domenic Moras, PA-C    Physical Exam: Vitals:   06/04/16 0939 06/04/16 0944 06/04/16 0948 06/04/16 1000  BP: 144/76 150/77  142/65  Pulse: 103 100 99 93  Resp: 18 22 15 17   Temp:      TempSrc:      SpO2: 91% 92% 100% 91%  Weight:      Height:          Constitutional: in mild distress, tachypneic Vitals:   06/04/16 0939 06/04/16 0944 06/04/16 0948 06/04/16 1000  BP: 144/76 150/77  142/65  Pulse: 103 100 99 93  Resp: 18 22 15 17   Temp:      TempSrc:      SpO2: 91% 92% 100% 91%  Weight:      Height:       Eyes: PERRL ENMT: Mucous membranes are moist. Posterior pharynx clear of any exudate or lesions.  Neck: normal, obese Respiratory: moves air well, significant coarse breath sounds with diffuse wheezing bilaterally  Cardiovascular: Regular rate and rhythm, no murmurs / rubs / gallops. Trace extremity edema. 2+ pedal pulses. Tachycardic Abdomen: mild tenderness throughout, no masses palpated. Bowel sounds positive.  Musculoskeletal: no clubbing / cyanosis. Normal muscle tone.  Skin: no rashes, lesions, ulcers. No induration Neurologic: CN 2-12 grossly intact. Strength 5/5 in all 4.  Psychiatric: Alert and oriented x 3. Anxious   Labs on Admission: I have personally reviewed  following labs and imaging studies  CBC:  Recent Labs Lab 06/04/16 0810  WBC 6.3  NEUTROABS 3.5  HGB 14.8  HCT 46.6*  MCV 88.8  PLT [id number]   Basic Metabolic Panel:  Recent Labs Lab 06/04/16 0810  NA 139  K 4.0  CL 102  CO2 28  GLUCOSE 91  BUN 16  CREATININE 0.66  CALCIUM 9.2   GFR: Estimated Creatinine Clearance: 113 mL/min (by C-G formula based on SCr of 0.8 mg/dL). Liver Function Tests:  Recent Labs Lab 06/04/16 0810  AST 20  ALT 33  ALKPHOS 55  BILITOT 0.6  PROT 8.1  ALBUMIN 4.0   No results for input(s): LIPASE, AMYLASE in the last 168 hours. No results for input(s): AMMONIA in the last 168 hours. Coagulation Profile: No results for input(s):  INR, PROTIME in the last 168 hours. Cardiac Enzymes:  Recent Labs Lab 06/04/16 0810  TROPONINI <0.03   BNP (last 3 results) No results for input(s): PROBNP in the last 8760 hours. HbA1C: No results for input(s): HGBA1C in the last 72 hours. CBG: No results for input(s): GLUCAP in the last 168 hours. Lipid Profile: No results for input(s): CHOL, HDL, LDLCALC, TRIG, CHOLHDL, LDLDIRECT in the last 72 hours. Thyroid Function Tests: No results for input(s): TSH, T4TOTAL, FREET4, T3FREE, THYROIDAB in the last 72 hours. Anemia Panel: No results for input(s): VITAMINB12, FOLATE, FERRITIN, TIBC, IRON, RETICCTPCT in the last 72 hours. Urine analysis:    Component Value Date/Time   COLORURINE YELLOW 06/04/2016 0750   APPEARANCEUR CLEAR 06/04/2016 0750   LABSPEC 1.012 06/04/2016 0750   PHURINE 5.5 06/04/2016 0750   GLUCOSEU NEGATIVE 06/04/2016 0750   GLUCOSEU NEGATIVE 08/12/2012 1516   HGBUR NEGATIVE 06/04/2016 0750   BILIRUBINUR NEGATIVE 06/04/2016 0750   KETONESUR NEGATIVE 06/04/2016 0750   PROTEINUR 30 (A) 06/04/2016 0750   UROBILINOGEN 1.0 08/17/2014 1522   NITRITE NEGATIVE 06/04/2016 0750   LEUKOCYTESUR NEGATIVE 06/04/2016 0750   Radiological Exams on Admission: Dg Chest 2 View  Result Date:  06/04/2016 CLINICAL DATA:  Cough, congestion, shortness of breath for 2 weeks, history of smoking EXAM: CHEST  2 VIEW COMPARISON:  04/13/2016 FINDINGS: Cardiomediastinal silhouette is stable. Mild infrahilar bronchitic changes. There is streaky right base medially atelectasis or early infiltrate. No pulmonary edema. Degenerative changes thoracic spine again noted. IMPRESSION: Streaky right base medially atelectasis or early infiltrate. No pulmonary edema. Mild infrahilar bronchitic changes. Electronically Signed   By: Lahoma Crocker M.D.   On: 06/04/2016 09:46    Assessment/Plan Active Problems:   GERD (gastroesophageal reflux disease)   Tobacco abuse   Obesity (BMI 30-39.9)   CAP (community acquired pneumonia)    CAP - start Ceftriaxone and Azithromycin  - admit to Medsurg - urine legionella and strep pneumo, HIV, cultures as per order set  Probable COPD with exacerbation - duonebs, steroids, pulmicort - flutter valve - guaifenassin prn - will need PFTs after acute process resolves to determine if she has COPD and severity  Obesity - will benefit from weight loss, will benefit from OSA evaluation  HTN - resume home medications  HLD - resume home medications   DVT prophylaxis: Lovenox  Code Status: Full  Family Communication: significant other bedside Disposition Plan: admit to medsurg Consults called: none  Admission status: observation    Marzetta Board, MD [facility] Pager [phone]  If 7PM-7AM, please contact night-coverage [email or web address] Password [email or web address]  06/04/2016, 10:44 AM

## 2016-06-04 NOTE — ED Notes (Signed)
MD at bedside. ADMITTING MD PRESENT- AWARE OF ADMISSION

## 2016-06-04 NOTE — ED Notes (Signed)
Pt encouraged ECDB increased to 02 sats to 97% however with rest shallow breathing 02 sats 91-94%. Pt requesting additional medication to assist with wheezing

## 2016-06-05 ENCOUNTER — Observation Stay (HOSPITAL_BASED_OUTPATIENT_CLINIC_OR_DEPARTMENT_OTHER): Payer: BC Managed Care – PPO

## 2016-06-05 DIAGNOSIS — Z833 Family history of diabetes mellitus: Secondary | ICD-10-CM | POA: Diagnosis not present

## 2016-06-05 DIAGNOSIS — K219 Gastro-esophageal reflux disease without esophagitis: Secondary | ICD-10-CM

## 2016-06-05 DIAGNOSIS — E785 Hyperlipidemia, unspecified: Secondary | ICD-10-CM | POA: Diagnosis present

## 2016-06-05 DIAGNOSIS — E669 Obesity, unspecified: Secondary | ICD-10-CM | POA: Diagnosis not present

## 2016-06-05 DIAGNOSIS — E1165 Type 2 diabetes mellitus with hyperglycemia: Secondary | ICD-10-CM | POA: Diagnosis present

## 2016-06-05 DIAGNOSIS — J9811 Atelectasis: Secondary | ICD-10-CM | POA: Diagnosis present

## 2016-06-05 DIAGNOSIS — J189 Pneumonia, unspecified organism: Secondary | ICD-10-CM | POA: Diagnosis present

## 2016-06-05 DIAGNOSIS — Z7984 Long term (current) use of oral hypoglycemic drugs: Secondary | ICD-10-CM | POA: Diagnosis not present

## 2016-06-05 DIAGNOSIS — Z79899 Other long term (current) drug therapy: Secondary | ICD-10-CM | POA: Diagnosis not present

## 2016-06-05 DIAGNOSIS — B370 Candidal stomatitis: Secondary | ICD-10-CM | POA: Diagnosis present

## 2016-06-05 DIAGNOSIS — R0600 Dyspnea, unspecified: Secondary | ICD-10-CM | POA: Diagnosis not present

## 2016-06-05 DIAGNOSIS — R0602 Shortness of breath: Secondary | ICD-10-CM | POA: Diagnosis present

## 2016-06-05 DIAGNOSIS — K760 Fatty (change of) liver, not elsewhere classified: Secondary | ICD-10-CM | POA: Diagnosis present

## 2016-06-05 DIAGNOSIS — J440 Chronic obstructive pulmonary disease with acute lower respiratory infection: Secondary | ICD-10-CM | POA: Diagnosis present

## 2016-06-05 DIAGNOSIS — G4710 Hypersomnia, unspecified: Secondary | ICD-10-CM | POA: Diagnosis present

## 2016-06-05 DIAGNOSIS — Z8249 Family history of ischemic heart disease and other diseases of the circulatory system: Secondary | ICD-10-CM | POA: Diagnosis not present

## 2016-06-05 DIAGNOSIS — G4733 Obstructive sleep apnea (adult) (pediatric): Secondary | ICD-10-CM | POA: Diagnosis present

## 2016-06-05 DIAGNOSIS — Z720 Tobacco use: Secondary | ICD-10-CM | POA: Diagnosis not present

## 2016-06-05 DIAGNOSIS — Z886 Allergy status to analgesic agent status: Secondary | ICD-10-CM | POA: Diagnosis not present

## 2016-06-05 DIAGNOSIS — J441 Chronic obstructive pulmonary disease with (acute) exacerbation: Secondary | ICD-10-CM | POA: Diagnosis present

## 2016-06-05 DIAGNOSIS — Z6841 Body Mass Index (BMI) 40.0 and over, adult: Secondary | ICD-10-CM | POA: Diagnosis not present

## 2016-06-05 DIAGNOSIS — T380X5A Adverse effect of glucocorticoids and synthetic analogues, initial encounter: Secondary | ICD-10-CM | POA: Diagnosis present

## 2016-06-05 DIAGNOSIS — I10 Essential (primary) hypertension: Secondary | ICD-10-CM | POA: Diagnosis present

## 2016-06-05 DIAGNOSIS — F17210 Nicotine dependence, cigarettes, uncomplicated: Secondary | ICD-10-CM | POA: Diagnosis present

## 2016-06-05 LAB — GLUCOSE, CAPILLARY
Glucose-Capillary: 165 mg/dL — ABNORMAL HIGH (ref 65–99)
Glucose-Capillary: 305 mg/dL — ABNORMAL HIGH (ref 65–99)

## 2016-06-05 LAB — HIV ANTIBODY (ROUTINE TESTING W REFLEX): HIV Screen 4th Generation wRfx: NONREACTIVE

## 2016-06-05 LAB — ECHOCARDIOGRAM COMPLETE
Height: 67 in
Weight: 4688 oz

## 2016-06-05 MED ORDER — HYDROCODONE-HOMATROPINE 5-1.5 MG/5ML PO SYRP
5.0000 mL | ORAL_SOLUTION | Freq: Four times a day (QID) | ORAL | Status: DC | PRN
  Administered 2016-06-05 – 2016-06-07 (×6): 5 mL via ORAL
  Filled 2016-06-05 (×6): qty 5

## 2016-06-05 MED ORDER — FLUTICASONE PROPIONATE 50 MCG/ACT NA SUSP
1.0000 | Freq: Every day | NASAL | Status: DC
  Administered 2016-06-05 – 2016-06-10 (×5): 1 via NASAL
  Filled 2016-06-05 (×2): qty 16

## 2016-06-05 NOTE — Progress Notes (Signed)
  Echocardiogram 2D Echocardiogram has been performed.  Sharon Swanson 06/05/2016, 1:25 PM

## 2016-06-05 NOTE — Progress Notes (Signed)
PROGRESS NOTE    Sharon Swanson  [id number] DOB: [birth year] DOA: 06/04/2016 PCP: Pcp Not In System   Chief Complaint  Patient presents with  . Nasal Congestion  . Cough    Brief Narrative:  HPI on 06/04/2016 by Dr. Marzetta Board Sharon Swanson is a 55 y.o. female with medical history significant of tobacco abuse, obesity, HTN, HLD, DM, presents to the ER with complaints of shortness of breath. Patient has been having a productive cough / congestion / fever and chills for ~2 weeks, saw her PCP 2 days ago and was given prednisone and Azithromycin. Her symptoms have not improved and decided to present to the ED. She denies abdominal pain, has mild nausea but no vomiting. Denies chest pain and palpitations.   Assessment & Plan   Community-acquired pneumonia -Chest x-ray showed streaky right base medially atelectasis or early infiltrate area and mild infrahilar bronchitic changes -Currently afebrile with no leukocytosis -Continue azithromycin, ceftriaxone -Strep pneumonia urine antigen negative -Blood cultures pending -Will add on hycodan   Probable COPD exacerbation -Patient does smoke  -Bronchitic changes noted on chest x-ray -Continue Solu-Medrol, nebs, antibiotics as above -Patient will need pulmonary function testing upon discharge  Dyspnea -Multifactorial including possible COPD, possible sleep apnea, community acquired pneumonia -Continue treatment and plan as above -Will obtain echocardiogram  Chest pain -Likely musculoskeletal, made worse with coughing, tender to palpation.  Possible sleep apnea -We will need sleep study upon discharge  Obesity -Patient to discuss lifestyle modifications with primary care physician upon discharge  Essential hypertension -Continue HCTZ, lisinopril and verapamil  Hyperlipidemia -Continue statin  Nasal congestion -Will add on humified air, flonase  DVT Prophylaxis  Lovenox  Code Status: Full  Family Communication: None  at bedside  Disposition Plan: Currently in observation. Pending echocardiogram, continue IV antibiotics  Consultants None  Procedures  None  Antibiotics   Anti-infectives    Start     Dose/Rate Route Frequency Ordered Stop   06/04/16 1200  cefTRIAXone (ROCEPHIN) 1 g in dextrose 5 % 50 mL IVPB     1 g 100 mL/hr over 30 Minutes Intravenous Every 24 hours 06/04/16 1040 06/11/16 1159   06/04/16 1200  azithromycin (ZITHROMAX) 500 mg in dextrose 5 % 250 mL IVPB     500 mg 250 mL/hr over 60 Minutes Intravenous Every 24 hours 06/04/16 1040 06/11/16 1159   06/04/16 1045  levofloxacin (LEVAQUIN) IVPB 750 mg  Status:  Discontinued     750 mg 100 mL/hr over 90 Minutes Intravenous  Once 06/04/16 1032 06/04/16 1047   06/04/16 1030  cefTRIAXone (ROCEPHIN) 1 g in dextrose 5 % 50 mL IVPB  Status:  Discontinued     1 g 100 mL/hr over 30 Minutes Intravenous  Once 06/04/16 1024 06/04/16 1031   06/04/16 1030  azithromycin (ZITHROMAX) tablet 500 mg  Status:  Discontinued     500 mg Oral  Once 06/04/16 1024 06/04/16 1031      Subjective:   Sharon Swanson seen and examined today.  Patient complained of cough, shortness of breath, nasal congestion. States her legs swell from time to time and she is unable to lie flat. Denies abdominal pain, nausea vomiting, comes with urination. Has had some left-sided chest pain under her left breast, made worse with coughing.   Objective:   Vitals:   06/04/16 2114 06/04/16 2247 06/05/16 0449 06/05/16 1030  BP: (!) 147/79  (!) 157/95   Pulse: 90  83   Resp: 16  18   Temp:  98.2 F (36.8 C)  98.4 F (36.9 C)   TempSrc: Oral  Oral   SpO2: 99% 97% 98% 95%  Weight:      Height:        Intake/Output Summary (Last 24 hours) at 06/05/16 1237 Last data filed at 06/05/16 0300  Gross per 24 hour  Intake              480 ml  Output              550 ml  Net              -70 ml   Filed Weights   06/04/16 0736  Weight: 132.9 kg (293 lb)    Exam  General:  Well developed, well nourished, mild distress  HEENT: NCAT,  mucous membranes moist.   Cardiovascular: S1 S2 auscultated, no rubs, murmurs or gallops. Regular rate and rhythm. Chest wall tenderness under left breast  Respiratory: Diffuse expiratory wheezing   Abdomen: Soft, obese, nontender, nondistended, + bowel sounds, +umbilical hernia  Extremities: warm dry without cyanosis clubbing. Trace LE edema.  Neuro: AAOx3, nonfocal  Psych: Normal affect and demeanor with intact judgement and insight   Data Reviewed: I have personally reviewed following labs and imaging studies  CBC:  Recent Labs Lab 06/04/16 0810  WBC 6.3  NEUTROABS 3.5  HGB 14.8  HCT 46.6*  MCV 88.8  PLT [id number]   Basic Metabolic Panel:  Recent Labs Lab 06/04/16 0810  NA 139  K 4.0  CL 102  CO2 28  GLUCOSE 91  BUN 16  CREATININE 0.66  CALCIUM 9.2   GFR: Estimated Creatinine Clearance: 113 mL/min (by C-G formula based on SCr of 0.8 mg/dL). Liver Function Tests:  Recent Labs Lab 06/04/16 0810  AST 20  ALT 33  ALKPHOS 55  BILITOT 0.6  PROT 8.1  ALBUMIN 4.0   No results for input(s): LIPASE, AMYLASE in the last 168 hours. No results for input(s): AMMONIA in the last 168 hours. Coagulation Profile: No results for input(s): INR, PROTIME in the last 168 hours. Cardiac Enzymes:  Recent Labs Lab 06/04/16 0810  TROPONINI <0.03   BNP (last 3 results) No results for input(s): PROBNP in the last 8760 hours. HbA1C: No results for input(s): HGBA1C in the last 72 hours. CBG:  Recent Labs Lab 06/04/16 1257 06/04/16 1812 06/04/16 2105 06/05/16 0718  GLUCAP 212* 210* 290* 165*   Lipid Profile: No results for input(s): CHOL, HDL, LDLCALC, TRIG, CHOLHDL, LDLDIRECT in the last 72 hours. Thyroid Function Tests: No results for input(s): TSH, T4TOTAL, FREET4, T3FREE, THYROIDAB in the last 72 hours. Anemia Panel: No results for input(s): VITAMINB12, FOLATE, FERRITIN, TIBC, IRON, RETICCTPCT in  the last 72 hours. Urine analysis:    Component Value Date/Time   COLORURINE YELLOW 06/04/2016 0750   APPEARANCEUR CLEAR 06/04/2016 0750   LABSPEC 1.012 06/04/2016 0750   PHURINE 5.5 06/04/2016 0750   GLUCOSEU NEGATIVE 06/04/2016 0750   GLUCOSEU NEGATIVE 08/12/2012 1516   HGBUR NEGATIVE 06/04/2016 0750   BILIRUBINUR NEGATIVE 06/04/2016 0750   KETONESUR NEGATIVE 06/04/2016 0750   PROTEINUR 30 (A) 06/04/2016 0750   UROBILINOGEN 1.0 08/17/2014 1522   NITRITE NEGATIVE 06/04/2016 0750   LEUKOCYTESUR NEGATIVE 06/04/2016 0750   Sepsis Labs: @LABRCNTIP (procalcitonin:4,lacticidven:4)  ) Recent Results (from the past 240 hour(s))  Culture, blood (routine x 2) Call MD if unable to obtain prior to antibiotics being given     Status: None (Preliminary result)   Collection Time: 06/04/16  10:50 AM  Result Value Ref Range Status   Specimen Description   Final    BLOOD LEFT ANTECUBITAL Performed at [facility]    Special Requests NONE  Final   Culture PENDING  Incomplete   Report Status PENDING  Incomplete  Culture, blood (routine x 2) Call MD if unable to obtain prior to antibiotics being given     Status: None (Preliminary result)   Collection Time: 06/04/16 11:10 AM  Result Value Ref Range Status   Specimen Description   Final    BLOOD RIGHT ANTECUBITAL Performed at [facility]    Special Requests NONE  Final   Culture PENDING  Incomplete   Report Status PENDING  Incomplete      Radiology Studies: Dg Chest 2 View  Result Date: 06/04/2016 CLINICAL DATA:  Cough, congestion, shortness of breath for 2 weeks, history of smoking EXAM: CHEST  2 VIEW COMPARISON:  04/13/2016 FINDINGS: Cardiomediastinal silhouette is stable. Mild infrahilar bronchitic changes. There is streaky right base medially atelectasis or early infiltrate. No pulmonary edema. Degenerative changes thoracic spine again noted. IMPRESSION: Streaky right base medially atelectasis or early infiltrate. No  pulmonary edema. Mild infrahilar bronchitic changes. Electronically Signed   By: Lahoma Crocker M.D.   On: 06/04/2016 09:46     Scheduled Meds: . azithromycin  500 mg Intravenous Q24H  . budesonide (PULMICORT) nebulizer solution  0.25 mg Nebulization BID  . cefTRIAXone (ROCEPHIN)  IV  1 g Intravenous Q24H  . enoxaparin (LOVENOX) injection  0.5 mg/kg Subcutaneous Q24H  . fluticasone  1 spray Each Nare Daily  . hydrochlorothiazide  12.5 mg Oral Daily  . insulin aspart  0-5 Units Subcutaneous QHS  . insulin aspart  0-9 Units Subcutaneous TID WC  . ipratropium-albuterol  3 mL Nebulization QID  . lisinopril  2.5 mg Oral Daily  . methylPREDNISolone (SOLU-MEDROL) injection  60 mg Intravenous Q12H  . pravastatin  40 mg Oral Daily  . verapamil  240 mg Oral Daily   Continuous Infusions:    LOS: 0 days   Time Spent in minutes   30 minutes  Tongela Encinas D.O. on 06/05/2016 at 12:37 PM  Between 7am to 7pm - Pager - [phone]  After 7pm go to [email or web address] - password [email or web address]  And look for the night coverage person covering for me after hours  [facility] Office  [phone]

## 2016-06-06 LAB — CBC
HCT: 49.2 % — ABNORMAL HIGH (ref 36.0–46.0)
Hemoglobin: 15.7 g/dL — ABNORMAL HIGH (ref 12.0–15.0)
MCH: 28.3 pg (ref 26.0–34.0)
MCHC: 31.9 g/dL (ref 30.0–36.0)
MCV: 88.6 fL (ref 78.0–100.0)
PLATELETS: 260 10*3/uL (ref 150–400)
RBC: 5.55 MIL/uL — ABNORMAL HIGH (ref 3.87–5.11)
RDW: 15.1 % (ref 11.5–15.5)
WBC: 11.2 10*3/uL — AB (ref 4.0–10.5)

## 2016-06-06 LAB — BASIC METABOLIC PANEL
ANION GAP: 8 (ref 5–15)
BUN: 20 mg/dL (ref 6–20)
CALCIUM: 9.7 mg/dL (ref 8.9–10.3)
CO2: 30 mmol/L (ref 22–32)
Chloride: 96 mmol/L — ABNORMAL LOW (ref 101–111)
Creatinine, Ser: 0.78 mg/dL (ref 0.44–1.00)
GLUCOSE: 224 mg/dL — AB (ref 65–99)
Potassium: 5.2 mmol/L — ABNORMAL HIGH (ref 3.5–5.1)
SODIUM: 134 mmol/L — AB (ref 135–145)

## 2016-06-06 LAB — GLUCOSE, CAPILLARY
GLUCOSE-CAPILLARY: 308 mg/dL — AB (ref 65–99)
GLUCOSE-CAPILLARY: 303 mg/dL — AB (ref 65–99)

## 2016-06-06 MED ORDER — INSULIN ASPART 100 UNIT/ML ~~LOC~~ SOLN
0.0000 [IU] | Freq: Three times a day (TID) | SUBCUTANEOUS | Status: DC
  Administered 2016-06-06: 20 [IU] via SUBCUTANEOUS
  Administered 2016-06-07: 3 [IU] via SUBCUTANEOUS
  Administered 2016-06-07 (×2): 7 [IU] via SUBCUTANEOUS
  Administered 2016-06-08: 4 [IU] via SUBCUTANEOUS
  Administered 2016-06-08: 7 [IU] via SUBCUTANEOUS
  Administered 2016-06-08: 11 [IU] via SUBCUTANEOUS
  Administered 2016-06-09 (×2): 4 [IU] via SUBCUTANEOUS
  Administered 2016-06-10: 3 [IU] via SUBCUTANEOUS

## 2016-06-06 MED ORDER — INSULIN GLARGINE 100 UNIT/ML ~~LOC~~ SOLN
18.0000 [IU] | Freq: Every day | SUBCUTANEOUS | Status: DC
  Administered 2016-06-06 – 2016-06-10 (×5): 18 [IU] via SUBCUTANEOUS
  Filled 2016-06-06 (×5): qty 0.18

## 2016-06-06 NOTE — Progress Notes (Signed)
PROGRESS NOTE    Sharon Swanson  [id number] DOB: 1960/10/26 DOA: 06/04/2016 PCP: Pcp Not In System   Chief Complaint  Patient presents with  . Nasal Congestion  . Cough    Brief Narrative:  HPI on 06/04/2016 by Dr. Marzetta Board Sharon Swanson is a 55 y.o. female with medical history significant of tobacco abuse, obesity, HTN, HLD, DM, presents to the ER with complaints of shortness of breath. Patient has been having a productive cough / congestion / fever and chills for ~2 weeks, saw her PCP 2 days ago and was given prednisone and Azithromycin. Her symptoms have not improved and decided to present to the ED. She denies abdominal pain, has mild nausea but no vomiting. Denies chest pain and palpitations.   Assessment & Plan   Community-acquired pneumonia -Chest x-ray showed streaky right base medially atelectasis or early infiltrate area and mild infrahilar bronchitic changes -Currently afebrile  -Continue azithromycin, ceftriaxone, antitussives, hycodan PRN -Strep pneumonia urine antigen negative -Blood cultures show no growth to date  Probable COPD exacerbation -Patient does smoke  -Bronchitic changes noted on chest x-ray -Continue Solu-Medrol, nebs, antibiotics as above -Patient will need pulmonary function testing upon discharge  Leukocytosis -Likely reactive to steroids vs infection -Patient currently afebrile  Dyspnea -Multifactorial including possible COPD, possible sleep apnea, community acquired pneumonia -Continue treatment and plan as above -Echocardiogram: EF [id number], grade 1 diastolic dysfunction -Patient currently appears to be euvolemic   Chest pain -Likely musculoskeletal, made worse with coughing, tender to palpation.  Possible sleep apnea -Will need sleep study upon discharge -Will consult case management  Obesity -Patient to discuss lifestyle modifications with primary care physician upon discharge  Essential hypertension -Continue HCTZ,  lisinopril and verapamil  Hyperlipidemia -Continue statin  Nasal congestion -Continue flonase  DVT Prophylaxis  Lovenox  Code Status: Full  Family Communication: None at bedside  Disposition Plan: Continue current treatment, likely discharge within 48 hours  Consultants None  Procedures  Echocardiogram  Antibiotics   Anti-infectives    Start     Dose/Rate Route Frequency Ordered Stop   06/04/16 1200  cefTRIAXone (ROCEPHIN) 1 g in dextrose 5 % 50 mL IVPB     1 g 100 mL/hr over 30 Minutes Intravenous Every 24 hours 06/04/16 1040 06/11/16 1159   06/04/16 1200  azithromycin (ZITHROMAX) 500 mg in dextrose 5 % 250 mL IVPB     500 mg 250 mL/hr over 60 Minutes Intravenous Every 24 hours 06/04/16 1040 06/11/16 1159   06/04/16 1045  levofloxacin (LEVAQUIN) IVPB 750 mg  Status:  Discontinued     750 mg 100 mL/hr over 90 Minutes Intravenous  Once 06/04/16 1032 06/04/16 1047   06/04/16 1030  cefTRIAXone (ROCEPHIN) 1 g in dextrose 5 % 50 mL IVPB  Status:  Discontinued     1 g 100 mL/hr over 30 Minutes Intravenous  Once 06/04/16 1024 06/04/16 1031   06/04/16 1030  azithromycin (ZITHROMAX) tablet 500 mg  Status:  Discontinued     500 mg Oral  Once 06/04/16 1024 06/04/16 1031      Subjective:   Sharon Swanson seen and examined today.  Patient feels her breathing has improved.  Continues to have cough, but feels it is improving slowly.  Denies abdominal pain, nausea, vomiting, diarrhea, constipation, chest pain, headache, dizziness.  Objective:   Vitals:   06/05/16 2141 06/06/16 0148 06/06/16 0903 06/06/16 1211  BP: (!) 149/87 (!) 146/85    Pulse: 82 77 94 80  Resp: 18 18 20  18  Temp: 97.9 F (36.6 C) 98.1 F (36.7 C)    TempSrc: Oral Oral    SpO2: 96% 98% 94% 96%  Weight:      Height:        Intake/Output Summary (Last 24 hours) at 06/06/16 1322 Last data filed at 06/06/16 1003  Gross per 24 hour  Intake              840 ml  Output                0 ml  Net               840 ml   Filed Weights   06/04/16 0736  Weight: 132.9 kg (293 lb)    Exam  General: Well developed, well nourished, no apparent distress  HEENT: NCAT,  mucous membranes moist.   Cardiovascular: S1 S2 auscultated, no murmurs, RRR  Respiratory: Diffuse expiratory wheezing, mildly improving   Abdomen: Soft, obese, nontender, nondistended, + bowel sounds, +umbilical hernia  Extremities: warm dry without cyanosis clubbing, or edema  Neuro: AAOx3, nonfocal  Psych: Appropriate mood and affect   Data Reviewed: I have personally reviewed following labs and imaging studies  CBC:  Recent Labs Lab 06/04/16 0810 06/06/16 0453  WBC 6.3 11.2*  NEUTROABS 3.5  --   HGB 14.8 15.7*  HCT 46.6* 49.2*  MCV 88.8 88.6  PLT 214 [id number]   Basic Metabolic Panel:  Recent Labs Lab 06/04/16 0810 06/06/16 0453  NA 139 134*  K 4.0 5.2*  CL 102 96*  CO2 28 30  GLUCOSE 91 224*  BUN 16 20  CREATININE 0.66 0.78  CALCIUM 9.2 9.7   GFR: Estimated Creatinine Clearance: 113 mL/min (by C-G formula based on SCr of 0.8 mg/dL). Liver Function Tests:  Recent Labs Lab 06/04/16 0810  AST 20  ALT 33  ALKPHOS 55  BILITOT 0.6  PROT 8.1  ALBUMIN 4.0   No results for input(s): LIPASE, AMYLASE in the last 168 hours. No results for input(s): AMMONIA in the last 168 hours. Coagulation Profile: No results for input(s): INR, PROTIME in the last 168 hours. Cardiac Enzymes:  Recent Labs Lab 06/04/16 0810  TROPONINI <0.03   BNP (last 3 results) No results for input(s): PROBNP in the last 8760 hours. HbA1C: No results for input(s): HGBA1C in the last 72 hours. CBG:  Recent Labs Lab 06/04/16 1812 06/04/16 2105 06/05/16 0718 06/05/16 2155 06/06/16 1153  GLUCAP 210* 290* 165* 305* 308*   Lipid Profile: No results for input(s): CHOL, HDL, LDLCALC, TRIG, CHOLHDL, LDLDIRECT in the last 72 hours. Thyroid Function Tests: No results for input(s): TSH, T4TOTAL, FREET4, T3FREE, THYROIDAB in  the last 72 hours. Anemia Panel: No results for input(s): VITAMINB12, FOLATE, FERRITIN, TIBC, IRON, RETICCTPCT in the last 72 hours. Urine analysis:    Component Value Date/Time   COLORURINE YELLOW 06/04/2016 0750   APPEARANCEUR CLEAR 06/04/2016 0750   LABSPEC 1.012 06/04/2016 0750   PHURINE 5.5 06/04/2016 0750   GLUCOSEU NEGATIVE 06/04/2016 0750   GLUCOSEU NEGATIVE 08/12/2012 1516   HGBUR NEGATIVE 06/04/2016 0750   BILIRUBINUR NEGATIVE 06/04/2016 0750   KETONESUR NEGATIVE 06/04/2016 0750   PROTEINUR 30 (A) 06/04/2016 0750   UROBILINOGEN 1.0 08/17/2014 1522   NITRITE NEGATIVE 06/04/2016 0750   LEUKOCYTESUR NEGATIVE 06/04/2016 0750   Sepsis Labs: @LABRCNTIP (procalcitonin:4,lacticidven:4)  ) Recent Results (from the past 240 hour(s))  Culture, blood (routine x 2) Call MD if unable to obtain prior to antibiotics being given  Status: None (Preliminary result)   Collection Time: 06/04/16 10:50 AM  Result Value Ref Range Status   Specimen Description BLOOD LEFT ANTECUBITAL  Final   Special Requests NONE  Final   Culture   Final    NO GROWTH 1 DAY Performed at [facility]    Report Status PENDING  Incomplete  Culture, blood (routine x 2) Call MD if unable to obtain prior to antibiotics being given     Status: None (Preliminary result)   Collection Time: 06/04/16 11:10 AM  Result Value Ref Range Status   Specimen Description BLOOD RIGHT ANTECUBITAL  Final   Special Requests NONE  Final   Culture   Final    NO GROWTH 1 DAY Performed at [facility]    Report Status PENDING  Incomplete      Radiology Studies: No results found.   Scheduled Meds: . azithromycin  500 mg Intravenous Q24H  . budesonide (PULMICORT) nebulizer solution  0.25 mg Nebulization BID  . cefTRIAXone (ROCEPHIN)  IV  1 g Intravenous Q24H  . enoxaparin (LOVENOX) injection  0.5 mg/kg Subcutaneous Q24H  . fluticasone  1 spray Each Nare Daily  . hydrochlorothiazide  12.5 mg Oral Daily  .  insulin aspart  0-20 Units Subcutaneous TID WC  . insulin aspart  0-5 Units Subcutaneous QHS  . insulin glargine  18 Units Subcutaneous Daily  . ipratropium-albuterol  3 mL Nebulization QID  . lisinopril  2.5 mg Oral Daily  . methylPREDNISolone (SOLU-MEDROL) injection  60 mg Intravenous Q12H  . pravastatin  40 mg Oral Daily  . verapamil  240 mg Oral Daily   Continuous Infusions:    LOS: 1 day   Time Spent in minutes   30 minutes  Cindra Austad D.O. on 06/06/2016 at 1:22 PM  Between 7am to 7pm - Pager - [phone]  After 7pm go to [email or web address] - password [email or web address]  And look for the night coverage person covering for me after hours  [facility] Office  [phone]

## 2016-06-06 NOTE — Progress Notes (Signed)
Inpatient Diabetes Program Recommendations  AACE/ADA: New Consensus Statement on Inpatient Glycemic Control (2015)  Target Ranges:  Prepandial:   less than 140 mg/dL      Peak postprandial:   less than 180 mg/dL (1-2 hours)      Critically ill patients:  140 - 180 mg/dL   Lab Results  Component Value Date   GLUCAP 305 (H) 06/05/2016   HGBA1C 6.7 (H) 08/20/2014   Results for Sharon Swanson, Sharon Swanson (MRN [id number]) as of 06/06/2016 11:00  Ref. Range 06/04/2016 12:57 06/04/2016 18:12 06/04/2016 21:05 06/05/2016 07:18 06/05/2016 21:55  Glucose-Capillary Latest Ref Range: 65 - 99 mg/dL 212 (H) 210 (H) 290 (H) 165 (H) 305 (H)   Review of Glycemic Control  Diabetes history: DM2 Outpatient Diabetes medications: metformin 500 mg bid Current orders for Inpatient glycemic control: Novolog sensitive tidwc and hs  Blood sugars in 200-300s. Needs insulin adjustment.  Inpatient Diabetes Program Recommendations:    HgbA1C to assess glycemic control prior to admission. Add Lantus 18 units QHS Increase Novolog to resistant tidwc and hs  Will continue to follow. Thank you. Lorenda Peck, RD, LDN, CDE Inpatient Diabetes Coordinator [phone]

## 2016-06-07 DIAGNOSIS — J441 Chronic obstructive pulmonary disease with (acute) exacerbation: Secondary | ICD-10-CM

## 2016-06-07 DIAGNOSIS — J449 Chronic obstructive pulmonary disease, unspecified: Secondary | ICD-10-CM

## 2016-06-07 DIAGNOSIS — J189 Pneumonia, unspecified organism: Secondary | ICD-10-CM

## 2016-06-07 LAB — BASIC METABOLIC PANEL
ANION GAP: 10 (ref 5–15)
BUN: 21 mg/dL — ABNORMAL HIGH (ref 6–20)
CALCIUM: 9.7 mg/dL (ref 8.9–10.3)
CO2: 32 mmol/L (ref 22–32)
Chloride: 94 mmol/L — ABNORMAL LOW (ref 101–111)
Creatinine, Ser: 0.64 mg/dL (ref 0.44–1.00)
Glucose, Bld: 152 mg/dL — ABNORMAL HIGH (ref 65–99)
Potassium: 4.6 mmol/L (ref 3.5–5.1)
Sodium: 136 mmol/L (ref 135–145)

## 2016-06-07 LAB — GLUCOSE, CAPILLARY
Glucose-Capillary: 144 mg/dL — ABNORMAL HIGH (ref 65–99)
Glucose-Capillary: 218 mg/dL — ABNORMAL HIGH (ref 65–99)

## 2016-06-07 LAB — CBC
HEMATOCRIT: 49.2 % — AB (ref 36.0–46.0)
Hemoglobin: 16.2 g/dL — ABNORMAL HIGH (ref 12.0–15.0)
MCH: 28.3 pg (ref 26.0–34.0)
MCHC: 32.9 g/dL (ref 30.0–36.0)
MCV: 86 fL (ref 78.0–100.0)
Platelets: 254 10*3/uL (ref 150–400)
RBC: 5.72 MIL/uL — ABNORMAL HIGH (ref 3.87–5.11)
RDW: 14.8 % (ref 11.5–15.5)
WBC: 10.2 10*3/uL (ref 4.0–10.5)

## 2016-06-07 LAB — HEMOGLOBIN A1C
Hgb A1c MFr Bld: 6.9 % — ABNORMAL HIGH (ref 4.8–5.6)
MEAN PLASMA GLUCOSE: 151 mg/dL

## 2016-06-07 LAB — PROCALCITONIN

## 2016-06-07 MED ORDER — HYDROCOD POLST-CPM POLST ER 10-8 MG/5ML PO SUER
5.0000 mL | Freq: Two times a day (BID) | ORAL | Status: DC
  Administered 2016-06-07 – 2016-06-10 (×7): 5 mL via ORAL
  Filled 2016-06-07 (×7): qty 5

## 2016-06-07 MED ORDER — BUDESONIDE 0.5 MG/2ML IN SUSP
0.5000 mg | Freq: Two times a day (BID) | RESPIRATORY_TRACT | Status: DC
  Administered 2016-06-07 – 2016-06-10 (×6): 0.5 mg via RESPIRATORY_TRACT
  Filled 2016-06-07 (×6): qty 2

## 2016-06-07 MED ORDER — INSULIN ASPART 100 UNIT/ML ~~LOC~~ SOLN
8.0000 [IU] | Freq: Three times a day (TID) | SUBCUTANEOUS | Status: DC
  Administered 2016-06-07 – 2016-06-10 (×7): 8 [IU] via SUBCUTANEOUS

## 2016-06-07 MED ORDER — AZITHROMYCIN 500 MG PO TABS
500.0000 mg | ORAL_TABLET | Freq: Every day | ORAL | Status: AC
Start: 2016-06-07 — End: 2016-06-09
  Administered 2016-06-07 – 2016-06-09 (×3): 500 mg via ORAL
  Filled 2016-06-07 (×3): qty 1

## 2016-06-07 MED ORDER — IPRATROPIUM-ALBUTEROL 0.5-2.5 (3) MG/3ML IN SOLN
3.0000 mL | Freq: Four times a day (QID) | RESPIRATORY_TRACT | Status: DC
Start: 2016-06-07 — End: 2016-06-08
  Administered 2016-06-07 – 2016-06-08 (×2): 3 mL via RESPIRATORY_TRACT
  Filled 2016-06-07 (×2): qty 3

## 2016-06-07 MED ORDER — FUROSEMIDE 10 MG/ML IJ SOLN
40.0000 mg | Freq: Once | INTRAMUSCULAR | Status: AC
  Administered 2016-06-07: 40 mg via INTRAVENOUS
  Filled 2016-06-07: qty 4

## 2016-06-07 MED ORDER — NYSTATIN 100000 UNIT/ML MT SUSP
5.0000 mL | Freq: Four times a day (QID) | OROMUCOSAL | Status: DC
  Administered 2016-06-07 – 2016-06-10 (×10): 500000 [IU] via ORAL
  Filled 2016-06-07 (×10): qty 5

## 2016-06-07 NOTE — Progress Notes (Signed)
PHARMACIST - PHYSICIAN COMMUNICATION DR:   Ree Kida CONCERNING: Antibiotic IV to Oral Route Change Policy  RECOMMENDATION: This patient is receiving Azithromycin by the intravenous route.  Based on criteria approved by the Pharmacy and Therapeutics Committee, the antibiotic(s) is/are being converted to the equivalent oral dose form(s).   DESCRIPTION: These criteria include:  Patient being treated for a respiratory tract infection, urinary tract infection, cellulitis or clostridium difficile associated diarrhea if on metronidazole  The patient is not neutropenic and does not exhibit a GI malabsorption state  The patient is eating (either orally or via tube) and/or has been taking other orally administered medications for a least 24 hours  The patient is improving clinically and has a Tmax < 100.5  If you have questions about this conversion, please contact the Pharmacy Department  []   [phone] )  [facility] []   [phone] )  [facility] []   [phone] )  [facility] []   [phone] )  [facility] [x]   [phone] )  [facility]

## 2016-06-07 NOTE — Progress Notes (Signed)
Inpatient Diabetes Program Recommendations  AACE/ADA: New Consensus Statement on Inpatient Glycemic Control (2015)  Target Ranges:  Prepandial:   less than 140 mg/dL      Peak postprandial:   less than 180 mg/dL (1-2 hours)      Critically ill patients:  140 - 180 mg/dL   Lab Results  Component Value Date   GLUCAP 218 (H) 06/07/2016   HGBA1C 6.9 (H) 06/06/2016   Results for ARIJAH, FOYLE (MRN [id number]) as of 06/07/2016 12:27  Ref. Range 06/05/2016 21:55 06/06/2016 11:53 06/06/2016 22:01 06/07/2016 07:17 06/07/2016 11:50  Glucose-Capillary Latest Ref Range: 65 - 99 mg/dL 305 (H) 308 (H) 303 (H) 144 (H) 218 (H)   Review of Glycemic Control  Blood sugar in 200-300s. FBS acceptable. Needs meal coverage insulin while on steroids. Eating 100%.  Inpatient Diabetes Program Recommendations:    Consider addition of Novolog 8 units tidwc.  Will continue to follow. Thank you. Lorenda Peck, RD, LDN, CDE Inpatient Diabetes Coordinator [phone]

## 2016-06-07 NOTE — Consult Note (Signed)
Name: Sharon Swanson MRN: [id number] DOB: [birth year]    ADMISSION DATE:  06/04/2016 CONSULTATION DATE:  06/07/16  REFERRING MD :  Dr. Ree Kida / [facility]   CHIEF COMPLAINT:  Concern for Sleep Apnea    HISTORY OF PRESENT ILLNESS:  55 y/o morbidly obese female (BMI ), smoker (1/2 ppd for 35 years, began at age 76) with PMH of HTN, HLD, DM, fatty liver, fibroids, pancreatitis, ventral hernia childhood asthma & GERD who presented to [facility] on 9/10 with a 2 week history of worsening cough, sputum production, fever/chills.    Initial evaluation concerning for right basilar infiltrate vs atelectasis.  She was also found to have significant wheezing on exam worrisome for COPD exacerbation.  The patient was admitted by [facility] for CAP + COPD exacerbation. She was treated with IV ceftriaxone & azithromycin for CAP.  Bronchodilators, IV steroids for COPD exacerbation. Urine strep antigen and HIV assessed and negative.  Blood cultures to date negative. The patient continued to have dyspnea with exertion despite therapy.  ECHO was assessed and demonstrated moderate LV concentric hypertrophy, normal systolic function, LVEF 0000000, no RWMA, grade 1 diastolic dysfunction.  I/O review shows the patient is 2.6L positive for the admission (at baseline, she takes lasix 20 mg QD and HCTZ 12.5 QD).  Hospitalist MD also concerned for possible sleep apnea.    PCCM consulted for pulmonary evaluation of dyspnea, new O2 requirement and possible OSA.  Pt reports feeling some better since admit but not back to baseline.  She feels weak/lightheaded with activity.  Reports ongoing dry cough.  Reports snoring.  No daytime tiredness or excessive daytime sleep.  If she sits down, she does fall asleep / nap. Denies hx of autoimmune diseases, blood clots.    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  has a past surgical history that includes orthopedic surgery.   Prior to Admission medications   Medication Sig Start Date End Date Taking? Authorizing Provider  albuterol (PROVENTIL HFA;VENTOLIN HFA) 108 (90 BASE) MCG/ACT inhaler Inhale 2 puffs into the lungs every 4 (four) hours as needed for wheezing or shortness of breath. 11/23/14  Yes Kristen N Ward, DO  albuterol (PROVENTIL) (2.5 MG/3ML) 0.083% nebulizer solution Take 3 mLs (2.5 mg total) by nebulization every 2 (two) hours as needed for wheezing or shortness of breath. 08/22/14  Yes Hosie Poisson, MD  guaiFENesin (MUCINEX) 600 MG 12 hr tablet Take 600 mg by mouth 2 (two) times daily. 06/02/16 06/02/17 Yes Historical Provider, MD  hydrochlorothiazide (MICROZIDE) 12.5 MG capsule Take 12.5 mg by mouth daily. 04/27/16  Yes Historical Provider, MD  lisinopril (PRINIVIL,ZESTRIL) 2.5 MG tablet Take 2.5 mg by mouth daily. 04/27/16 04/27/17 Yes Historical Provider, MD  metFORMIN (GLUCOPHAGE) 500 MG tablet Take 500 mg by mouth 2 (two) times daily. 04/27/16 04/27/17 Yes Historical Provider, MD  pravastatin (PRAVACHOL) 40 MG tablet Take 40 mg by mouth daily. 04/27/16 04/27/17 Yes Historical Provider, MD  predniSONE (DELTASONE) 10 MG tablet Take 10 mg by mouth See admin instructions. Prednisone 12 day taper dose started 09/08 06/02/16  Yes Historical Provider, MD  ranitidine (  ZANTAC) 150 MG tablet Take 150 mg by mouth 2 (two) times daily. 04/25/16 04/25/17 Yes Historical Provider, MD  verapamil (CALAN-SR) 240 MG CR tablet Take 240 mg by mouth daily. 06/02/16  Yes Historical Provider, MD  amLODipine (NORVASC) 10 MG tablet Take 1 tablet (10 mg total) by mouth daily. Patient not taking: Reported on 04/13/2016 08/22/14   Hosie Poisson, MD   azithromycin (ZITHROMAX Z-PAK) 250 MG tablet 2 po day one, then 1 daily x 4 days Patient not taking: Reported on 06/04/2016 04/13/16   Domenic Moras, PA-C  benzonatate (TESSALON) 100 MG capsule Take 1 capsule (100 mg total) by mouth every 8 (eight) hours. Patient not taking: Reported on 06/04/2016 04/13/16   Domenic Moras, PA-C  cloNIDine (CATAPRES) 0.1 MG tablet Take 1 tablet (0.1 mg total) by mouth 3 (three) times daily. Patient not taking: Reported on 04/13/2016 08/22/14   Hosie Poisson, MD  furosemide (LASIX) 20 MG tablet Take 1 tablet (20 mg total) by mouth daily. Patient not taking: Reported on 04/13/2016 08/22/14   Hosie Poisson, MD  hydrALAZINE (APRESOLINE) 50 MG tablet Take 1 tablet (50 mg total) by mouth every 8 (eight) hours. Patient not taking: Reported on 04/13/2016 08/22/14   Hosie Poisson, MD  ipratropium-albuterol (DUONEB) 0.5-2.5 (3) MG/3ML SOLN Take 3 mLs by nebulization every 6 (six) hours. Patient not taking: Reported on 04/13/2016 08/22/14   Hosie Poisson, MD  omeprazole (PRILOSEC) 20 MG capsule Take 1 capsule (20 mg total) by mouth daily. Patient not taking: Reported on 04/13/2016 08/22/14   Hosie Poisson, MD  predniSONE (DELTASONE) 20 MG tablet 3 tabs po day one, then 2 tabs daily x 4 days Patient not taking: Reported on 06/04/2016 04/13/16   Domenic Moras, PA-C   Allergies  Allergen Reactions  . Aspirin     Abdominal pain    FAMILY HISTORY:  family history includes Diabetes in her other; Hypertension in her mother.   SOCIAL HISTORY:  reports that she has been smoking Cigarettes.  She has been smoking about 0.50 packs per day. She has never used smokeless tobacco. She reports that she does not drink alcohol or use drugs.  REVIEW OF SYSTEMS:  POSITIVES IN BOLD Constitutional: Negative for fever, chills, weight loss, malaise/fatigue and diaphoresis.  HENT: Negative for hearing loss, ear pain, nosebleeds, congestion, sore throat, neck pain, tinnitus and ear discharge.   Eyes: Negative  for blurred vision, double vision, photophobia, pain, discharge and redness.  Respiratory: Negative for cough, hemoptysis, sputum production, shortness of breath, wheezing and stridor.   Cardiovascular: Negative for chest pain with coughing, palpitations, orthopnea, claudication, leg swelling and PND.  Gastrointestinal: Negative for heartburn, nausea, vomiting, abdominal pain, diarrhea, constipation, blood in stool and melena.  Genitourinary: Negative for dysuria, urgency, frequency, hematuria and flank pain.  Musculoskeletal: Negative for myalgias, back pain, joint pain and falls.  Skin: Negative for itching and rash.  Neurological: Negative for dizziness, tingling, tremors, sensory change, speech change, focal weakness, seizures, loss of consciousness, weakness and headaches.  Endo/Heme/Allergies: Negative for environmental allergies and polydipsia. Does not bruise/bleed easily.  SUBJECTIVE:   VITAL SIGNS: Temp:  [98.1 F (36.7 C)-98.3 F (36.8 C)] 98.3 F (36.8 C) (09/13 0525) Pulse Rate:  [73-113] 87 (09/13 1257) Resp:  [18-22] 18 (09/13 1257) BP: (133-159)/(66-90) 159/90 (09/13 0525) SpO2:  [90 %-97 %] 93 % (09/13 1257)  PHYSICAL EXAMINATION: General:  Morbidly obese female in NAD Neuro:  AAOx4, speech clear, flat affect, MAE, normal strength  HEENT:  MM pink/moist, no  appreciable JVD, white plaques on posterior pharynx  Cardiovascular:  s1s2 rrr, no m/r/g Lungs:  Even/non-labored, lungs bilaterally diminished with wheezing,  upper airway wheezing  Abdomen:  Obese/soft, bsx4 active  Musculoskeletal:  No acute deformities  Skin:  Warm/dry, trace BLE edema    Recent Labs Lab 06/04/16 0810 06/06/16 0453 06/07/16 0451  NA 139 134* 136  K 4.0 5.2* 4.6  CL 102 96* 94*  CO2 28 30 32  BUN 16 20 21*  CREATININE 0.66 0.78 0.64  GLUCOSE 91 224* 152*    Recent Labs Lab 06/04/16 0810 06/06/16 0453 06/07/16 0451  HGB 14.8 15.7* 16.2*  HCT 46.6* 49.2* 49.2*  WBC 6.3 11.2*  10.2  PLT 214 260 254   No results found.   SIGNIFICANT EVENTS  9/10  Admit with CAP + suspected COPD exacerbation   STUDIES:  9/11  ECHO >> moderate LV concentric hypertrophy, normal systolic function, LVEF 0000000, no RWMA, grade 1 diastolic dysfunction.   ASSESSMENT / PLAN:  Dyspnea - likely multifactorial in the setting of morbid obesity, underlying obstructive lung disease, possible component of volume (on baseline lasix), RLL CAP and deconditioning at baseline.    Plan: ABX for PNA  Treat COPD exacerbation  Lasix 40 mg IV x1  If not better with volume removal, consider CTA of the chest (caution with DM & possible renal injury). Low clinical suspicion for embolic event.   RLL CAP   Plan: Continue rocephin / azithromycin  Repeat CXR in am  Pulmonary hygiene - IS, mobilize   Suspected COPD  Cough ? Asthma   Plan: Continue solumedrol  Duonebs Q6  Adjust budesonide > 0.5 mg BID  Tussionex BID for cough suppression   Tobacco Abuse   Plan: Smoking cessation counseling    Thrush   Plan: Oral nystatin  Morbid Obesity   Plan: Pt pursuing weight loss as an outpatient  Noe Gens, NP-C [facility] Pgr: [phone] or if no answer [phone] 06/07/2016, 1:41 PM   ATTENDING NOTE / ATTESTATION NOTE :   I have discussed the case with the resident/APP Noe Gens   I agree with the resident/APP's  history, physical examination, assessment, and plans.    I have edited the above note and modified it according to our agreed history, physical examination, assessment and plan.   Briefly, pt admitted after a 2 week h/o cough,SOB, congestion not getting better. She is a long time smoker. With childhood asthma which has been stable. Likely with OSA 2/2 hypersomnia, snoring, obesity, crowded airway.  Pt has been admitted 4 days now and is marginally improved.  She is 2L (+) since admission. Pt seen, comfortable, but gets SOB with minimal  exertion. VSS. On O22L. Prominent NV.  (+) Wheezing Bilaterally. Some crackles at bases. (+) Gr 2 edema.  Labs reviewed. WBC not elevated. Cultures (-) so far.   Assessment : Severe acute exacerbation of asthma, likely with COPD component 2/2 Bronchitis, possible RLL PNA, in a pt who stilll smokes cigarette. 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   Plan : 1. Needs diuresis. Will give Lasix 40 mg IV now.  She is on daily lasix at home. Suggest continue IV lasix until better. 2. Suggest deescalate Abx. Will check PCT to help with deescalation decision.  3. CXR in am.  4. Cont Pulmicort at 0.5 mg BID and QID Duoneb. Will need Symbicort 160/4.5 2P BID and alb MDI on discharge.  5. Cont Medrol IV >> likely  will need at long pred taper as pt is still with significant wheeze.  6. Most importantly, we discussed smoking cessation. We decided that she will quit today. 7. Will need a sleep study as an outpt. 8. Wean off O2 to keep sats > 88%.  9.  If not better in am, suggest getting ABG. Check for hypercapnea > may need BipaP.    Monica Becton, MD 06/07/2016, 3:19 PM [facility] Pager [phone] After 3 pm or if no answer, call [phone]

## 2016-06-07 NOTE — Progress Notes (Addendum)
PROGRESS NOTE    Sharon Swanson  [id number] DOB: 13-May-1961 DOA: 06/04/2016 PCP: Pcp Not In System   Chief Complaint  Patient presents with  . Nasal Congestion  . Cough    Brief Narrative:  HPI on 06/04/2016 by Dr. Marzetta Board Sharon Swanson is a 55 y.o. female with medical history significant of tobacco abuse, obesity, HTN, HLD, DM, presents to the ER with complaints of shortness of breath. Patient has been having a productive cough / congestion / fever and chills for ~2 weeks, saw her PCP 2 days ago and was given prednisone and Azithromycin. Her symptoms have not improved and decided to present to the ED. She denies abdominal pain, has mild nausea but no vomiting. Denies chest pain and palpitations.   Assessment & Plan   Community-acquired pneumonia -Chest x-ray showed streaky right base medially atelectasis or early infiltrate area and mild infrahilar bronchitic changes -Currently afebrile  -Continue azithromycin, ceftriaxone, antitussives, hycodan PRN -Strep pneumonia urine antigen negative -Blood cultures show no growth to date  Probable COPD exacerbation -Patient does smoke  -Bronchitic changes noted on chest x-ray -Continue Solu-Medrol, nebs, antibiotics as above -Pulmonology consulted and appreciated -Will likely need PFTs as an outpatient  Leukocytosis -Likely reactive to steroids vs infection -WBC improving, currently 10.2 -Patient currently afebrile  Dyspnea -Multifactorial including possible COPD, possible sleep apnea, community acquired pneumonia -Continue treatment and plan as above -Echocardiogram: EF [id number], grade 1 diastolic dysfunction -Patient currently appears to be euvolemic   Chest pain -Likely musculoskeletal, made worse with coughing, tender to palpation.  Possible sleep apnea -Will need sleep study upon discharge -case management consulted  Diabetes mellitus, type II -Metformin held -Continue lantus, ISS and CBG  monitoring -Hemoglobin A1c 6.9 -Added 8u TID with meals as patient is on steroids  Obesity -Patient to discuss lifestyle modifications with primary care physician upon discharge  Essential hypertension -Continue HCTZ, lisinopril and verapamil  Hyperlipidemia -Continue statin  Nasal congestion -Continue flonase  DVT Prophylaxis  Lovenox  Code Status: Full  Family Communication: None at bedside  Disposition Plan: Continue current treatment, likely discharge within 48 hours  Consultants Pulmonology  Procedures  Echocardiogram  Antibiotics   Anti-infectives    Start     Dose/Rate Route Frequency Ordered Stop   06/07/16 1200  azithromycin (ZITHROMAX) tablet 500 mg     500 mg Oral Daily 06/07/16 1128 06/10/16 0959   06/04/16 1200  cefTRIAXone (ROCEPHIN) 1 g in dextrose 5 % 50 mL IVPB     1 g 100 mL/hr over 30 Minutes Intravenous Every 24 hours 06/04/16 1040 06/11/16 1159   06/04/16 1200  azithromycin (ZITHROMAX) 500 mg in dextrose 5 % 250 mL IVPB  Status:  Discontinued     500 mg 250 mL/hr over 60 Minutes Intravenous Every 24 hours 06/04/16 1040 06/07/16 1128   06/04/16 1045  levofloxacin (LEVAQUIN) IVPB 750 mg  Status:  Discontinued     750 mg 100 mL/hr over 90 Minutes Intravenous  Once 06/04/16 1032 06/04/16 1047   06/04/16 1030  cefTRIAXone (ROCEPHIN) 1 g in dextrose 5 % 50 mL IVPB  Status:  Discontinued     1 g 100 mL/hr over 30 Minutes Intravenous  Once 06/04/16 1024 06/04/16 1031   06/04/16 1030  azithromycin (ZITHROMAX) tablet 500 mg  Status:  Discontinued     500 mg Oral  Once 06/04/16 1024 06/04/16 1031      Subjective:   Sharon Swanson seen and examined today.  Patient feels her breathing and  cough have improved.  Becomes tired of minor exertion, however, was able to stand during her shower this morning.  Denies abdominal pain, nausea, vomiting, diarrhea, constipation, chest pain, headache, dizziness.  Objective:   Vitals:   06/06/16 2125 06/07/16 0525  06/07/16 0924 06/07/16 1257  BP: 137/66 (!) 159/90    Pulse: 73 83 (!) 113 87  Resp: 18 18 (!) 22 18  Temp: 98.1 F (36.7 C) 98.3 F (36.8 C)    TempSrc: Oral Oral    SpO2: 97% 97% 94% 93%  Weight:      Height:        Intake/Output Summary (Last 24 hours) at 06/07/16 1352 Last data filed at 06/07/16 1000  Gross per 24 hour  Intake             1620 ml  Output                0 ml  Net             1620 ml   Filed Weights   06/04/16 0736  Weight: 132.9 kg (293 lb)    Exam  General: Well developed, well nourished, no apparent distress  HEENT: NCAT,  mucous membranes moist.   Cardiovascular: S1 S2 auscultated, no murmurs, RRR  Respiratory: Diffuse expiratory wheezing, improving. Diminished breath sounds  Abdomen: Soft, obese, nontender, nondistended, + bowel sounds, +umbilical hernia  Extremities: warm dry without cyanosis clubbing, or edema  Neuro: AAOx3, nonfocal  Psych: Appropriate mood and affect, pleasant   Data Reviewed: I have personally reviewed following labs and imaging studies  CBC:  Recent Labs Lab 06/04/16 0810 06/06/16 0453 06/07/16 0451  WBC 6.3 11.2* 10.2  NEUTROABS 3.5  --   --   HGB 14.8 15.7* 16.2*  HCT 46.6* 49.2* 49.2*  MCV 88.8 88.6 86.0  PLT 214 260 0000000   Basic Metabolic Panel:  Recent Labs Lab 06/04/16 0810 06/06/16 0453 06/07/16 0451  NA 139 134* 136  K 4.0 5.2* 4.6  CL 102 96* 94*  CO2 28 30 32  GLUCOSE 91 224* 152*  BUN 16 20 21*  CREATININE 0.66 0.78 0.64  CALCIUM 9.2 9.7 9.7   GFR: Estimated Creatinine Clearance: 113 mL/min (by C-G formula based on SCr of 0.64 mg/dL). Liver Function Tests:  Recent Labs Lab 06/04/16 0810  AST 20  ALT 33  ALKPHOS 55  BILITOT 0.6  PROT 8.1  ALBUMIN 4.0   No results for input(s): LIPASE, AMYLASE in the last 168 hours. No results for input(s): AMMONIA in the last 168 hours. Coagulation Profile: No results for input(s): INR, PROTIME in the last 168 hours. Cardiac  Enzymes:  Recent Labs Lab 06/04/16 0810  TROPONINI <0.03   BNP (last 3 results) No results for input(s): PROBNP in the last 8760 hours. HbA1C:  Recent Labs  06/06/16 0453  HGBA1C 6.9*   CBG:  Recent Labs Lab 06/05/16 2155 06/06/16 1153 06/06/16 2201 06/07/16 0717 06/07/16 1150  GLUCAP 305* 308* 303* 144* 218*   Lipid Profile: No results for input(s): CHOL, HDL, LDLCALC, TRIG, CHOLHDL, LDLDIRECT in the last 72 hours. Thyroid Function Tests: No results for input(s): TSH, T4TOTAL, FREET4, T3FREE, THYROIDAB in the last 72 hours. Anemia Panel: No results for input(s): VITAMINB12, FOLATE, FERRITIN, TIBC, IRON, RETICCTPCT in the last 72 hours. Urine analysis:    Component Value Date/Time   COLORURINE YELLOW 06/04/2016 [place] 06/04/2016 0750   LABSPEC 1.012 06/04/2016 0750   PHURINE 5.5 06/04/2016  [place] 06/04/2016 0750   GLUCOSEU NEGATIVE 08/12/2012 1516   HGBUR NEGATIVE 06/04/2016 0750   BILIRUBINUR NEGATIVE 06/04/2016 0750   KETONESUR NEGATIVE 06/04/2016 0750   PROTEINUR 30 (A) 06/04/2016 0750   UROBILINOGEN 1.0 08/17/2014 1522   NITRITE NEGATIVE 06/04/2016 0750   LEUKOCYTESUR NEGATIVE 06/04/2016 0750   Sepsis Labs: @LABRCNTIP (procalcitonin:4,lacticidven:4)  ) Recent Results (from the past 240 hour(s))  Culture, blood (routine x 2) Call MD if unable to obtain prior to antibiotics being given     Status: None (Preliminary result)   Collection Time: 06/04/16 10:50 AM  Result Value Ref Range Status   Specimen Description BLOOD LEFT ANTECUBITAL  Final   Special Requests NONE  Final   Culture   Final    NO GROWTH 2 DAYS Performed at [facility]    Report Status PENDING  Incomplete  Culture, blood (routine x 2) Call MD if unable to obtain prior to antibiotics being given     Status: None (Preliminary result)   Collection Time: 06/04/16 11:10 AM  Result Value Ref Range Status   Specimen Description BLOOD RIGHT  ANTECUBITAL  Final   Special Requests NONE  Final   Culture   Final    NO GROWTH 2 DAYS Performed at [facility]    Report Status PENDING  Incomplete      Radiology Studies: No results found.   Scheduled Meds: . azithromycin  500 mg Oral Daily  . budesonide (PULMICORT) nebulizer solution  0.25 mg Nebulization BID  . cefTRIAXone (ROCEPHIN)  IV  1 g Intravenous Q24H  . enoxaparin (LOVENOX) injection  0.5 mg/kg Subcutaneous Q24H  . fluticasone  1 spray Each Nare Daily  . hydrochlorothiazide  12.5 mg Oral Daily  . insulin aspart  0-20 Units Subcutaneous TID WC  . insulin aspart  0-5 Units Subcutaneous QHS  . insulin aspart  8 Units Subcutaneous TID WC  . insulin glargine  18 Units Subcutaneous Daily  . ipratropium-albuterol  3 mL Nebulization QID  . lisinopril  2.5 mg Oral Daily  . methylPREDNISolone (SOLU-MEDROL) injection  60 mg Intravenous Q12H  . pravastatin  40 mg Oral Daily  . verapamil  240 mg Oral Daily   Continuous Infusions:    LOS: 2 days   Time Spent in minutes   30 minutes  Sharon Swanson D.O. on 06/07/2016 at 1:52 PM  Between 7am to 7pm - Pager - [phone]  After 7pm go to [email or web address] - password [email or web address]  And look for the night coverage person covering for me after hours  [facility] Office  [phone]

## 2016-06-07 NOTE — Progress Notes (Signed)
Received a consult to assist with arranging sleep study, spoke with patient who had no preference as to location. [facility], received an order form, attending signed and faxed back to them at [phone]. They will call patient to arrange appt.

## 2016-06-08 ENCOUNTER — Inpatient Hospital Stay (HOSPITAL_COMMUNITY): Payer: BC Managed Care – PPO

## 2016-06-08 LAB — GLUCOSE, CAPILLARY: GLUCOSE-CAPILLARY: 289 mg/dL — AB (ref 65–99)

## 2016-06-08 LAB — CBC
HEMATOCRIT: 52.2 % — AB (ref 36.0–46.0)
HEMOGLOBIN: 16.6 g/dL — AB (ref 12.0–15.0)
MCH: 28.1 pg (ref 26.0–34.0)
MCHC: 31.8 g/dL (ref 30.0–36.0)
MCV: 88.5 fL (ref 78.0–100.0)
Platelets: 275 10*3/uL (ref 150–400)
RBC: 5.9 MIL/uL — AB (ref 3.87–5.11)
RDW: 15 % (ref 11.5–15.5)
WBC: 10.6 10*3/uL — AB (ref 4.0–10.5)

## 2016-06-08 LAB — BASIC METABOLIC PANEL
ANION GAP: 9 (ref 5–15)
BUN: 32 mg/dL — AB (ref 6–20)
CHLORIDE: 92 mmol/L — AB (ref 101–111)
CO2: 35 mmol/L — ABNORMAL HIGH (ref 22–32)
Calcium: 9.7 mg/dL (ref 8.9–10.3)
Creatinine, Ser: 0.77 mg/dL (ref 0.44–1.00)
GFR calc Af Amer: >60 mL/min (ref >60)
GFR calc non Af Amer: >60 mL/min (ref >60)
GLUCOSE: 170 mg/dL — AB (ref 65–99)
POTASSIUM: 4.8 mmol/L (ref 3.5–5.1)
Sodium: 136 mmol/L (ref 135–145)

## 2016-06-08 MED ORDER — IPRATROPIUM-ALBUTEROL 0.5-2.5 (3) MG/3ML IN SOLN
3.0000 mL | Freq: Four times a day (QID) | RESPIRATORY_TRACT | Status: DC
  Administered 2016-06-08 – 2016-06-10 (×8): 3 mL via RESPIRATORY_TRACT
  Filled 2016-06-08 (×9): qty 3

## 2016-06-08 NOTE — Progress Notes (Signed)
Name: Sharon Swanson MRN: [id number] DOB: Jan 19, 1961    ADMISSION DATE:  06/04/2016 CONSULTATION DATE:  06/07/16  REFERRING MD :  Dr. Ree Kida / [facility]   CHIEF COMPLAINT:  Concern for Sleep Apnea    HISTORY OF PRESENT ILLNESS:  55 y/o morbidly obese female (BMI ), smoker (1/2 ppd for 35 years, began at age 63) with PMH of HTN, HLD, DM, fatty liver, fibroids, pancreatitis, ventral hernia childhood asthma & GERD who presented to [facility] on 9/10 with a 2 week history of worsening cough, sputum production, fever/chills.    Initial evaluation concerning for right basilar infiltrate vs atelectasis.  She was also found to have significant wheezing on exam worrisome for COPD exacerbation.  The patient was admitted by [facility] for CAP + COPD exacerbation. She was treated with IV ceftriaxone & azithromycin for CAP.  Bronchodilators, IV steroids for COPD exacerbation. Urine strep antigen and HIV assessed and negative.  Blood cultures to date negative. The patient continued to have dyspnea with exertion despite therapy.  ECHO was assessed and demonstrated moderate LV concentric hypertrophy, normal systolic function, LVEF 0000000, no RWMA, grade 1 diastolic dysfunction.  I/O review shows the patient is 2.6L positive for the admission (at baseline, she takes lasix 20 mg QD and HCTZ 12.5 QD).  Hospitalist MD also concerned for possible sleep apnea.    PCCM consulted for pulmonary evaluation of dyspnea, new O2 requirement and possible OSA.  Pt reports feeling some better since admit but not back to baseline.  She feels weak/lightheaded with activity.  Reports ongoing dry cough.  Reports snoring.  No daytime tiredness or excessive daytime sleep.  If she sits down, she does fall asleep / nap. Denies hx of autoimmune diseases, blood clots.    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  Smoking -  Began age 62, 1/2 ppd at heaviest Activity Level -  Attempts to walk 30 min QD [employer] - none Travel -  none Curator - none     SUBJECTIVE:  Pt reports feeling better, no acute events  VITAL SIGNS: Temp:  [97.8 F (36.6 C)-98.5 F (36.9 C)] 98.5 F (36.9 C) (09/14 0446) Pulse Rate:  [74-87] 79 (09/14 0446) Resp:  [18-21] 20 (09/14 0446) BP: (139-144)/(62-73) 139/62 (09/14 0446) SpO2:  [92 %-96 %] 96 % (09/14 0846)  PHYSICAL EXAMINATION: General:  Morbidly obese female in NAD Neuro:  AAOx4, speech clear, flat affect, MAE, normal strength  HEENT:  MM pink/moist, no appreciable JVD, white plaques on posterior pharynx  Cardiovascular:  s1s2 rrr, no MRG Lungs:  Even/non-labored, scattered exp wheeze Abdomen:  Obese/soft, bsx4 active  Musculoskeletal:  No acute deformities  Skin:  Warm/dry, trace BLE edema    Recent Labs Lab 06/06/16 0453 06/07/16 0451 06/08/16 0433  NA 134* 136 136  K 5.2* 4.6 4.8  CL 96* 94* 92*  CO2 30 32 35*  BUN 20 21* 32*  CREATININE 0.78 0.64 0.77  GLUCOSE 224* 152* 170*    Recent Labs Lab 06/06/16 0453 06/07/16 0451 06/08/16 0433  HGB 15.7* 16.2* 16.6*  HCT 49.2* 49.2* 52.2*  WBC 11.2* 10.2 10.6*  PLT 260 254 275   Dg Chest 2 View  Result Date: 06/08/2016 CLINICAL DATA:  Shortness of breath and weakness. Two weeks of productive cough, chest congestion, fever, and chills. Began prednisone and azithromycin 3 days ago. Persistent symptoms. History of asthma, current smoker. EXAM: CHEST  2 VIEW COMPARISON:  PA and lateral chest x-ray  of June 04, 2016 FINDINGS: The lungs are adequately inflated. There is linear increased density at the right lung base consistent with atelectasis or residual infiltrate. The left lung is clear. The heart and pulmonary vascularity are normal. There is calcification in the wall of the aortic arch. The mediastinum is normal in width. There is multilevel degenerative disc disease of the thoracic spine. There is chronic stable prominence of the inferior aspect of the left sternoclavicular joint. IMPRESSION: Right  basilar pneumonia. This is slightly less conspicuous than on the previous study. Aortic atherosclerosis. Electronically Signed   By: David  Martinique M.D.   On: 06/08/2016 09:51     SIGNIFICANT EVENTS  9/10  Admit with CAP + suspected COPD exacerbation   STUDIES:  9/11  ECHO >> moderate LV concentric hypertrophy, normal systolic function, LVEF 0000000, no RWMA, grade 1 diastolic dysfunction.   ASSESSMENT / PLAN:  Dyspnea - likely multifactorial in the setting of morbid obesity, underlying obstructive lung disease, possible component of volume (on baseline lasix), RLL CAP and deconditioning at baseline.    Plan: ABX for PNA  Treat COPD exacerbation    RLL CAP   Plan: Continue rocephin / azithromycin  Pulmonary hygiene - IS, mobilize   Suspected COPD  Cough ? Asthma   Plan: Continue solumedrol  Duonebs Q6  Adjust budesonide > 0.5 mg BID  Tussionex BID for cough suppression  LUL Rounded Opacity - noted on CXR and CT from 2015.  Was to follow up in 2015 but lost to f/u.    Plan: Follow up in pulmonary clinic in one month with repeat CT imaging   Tobacco Abuse   Plan: Smoking cessation counseling    Thrush   Plan: Oral nystatin  Morbid Obesity    Plan: Pt pursuing weight loss as an outpatient.  Marshell Garfinkel MD [facility] Pager [phone] If no answer or after 3pm call: [phone] 06/08/2016, 11:36 AM

## 2016-06-08 NOTE — Progress Notes (Signed)
PROGRESS NOTE    Sharon Swanson  [id number] DOB: 1961-03-23 DOA: 06/04/2016 PCP: Pcp Not In System   Chief Complaint  Patient presents with  . Nasal Congestion  . Cough    Brief Narrative:  HPI on 06/04/2016 by Dr. Marzetta Board Sharon Swanson is a 55 y.o. female with medical history significant of tobacco abuse, obesity, HTN, HLD, DM, presents to the ER with complaints of shortness of breath. Patient has been having a productive cough / congestion / fever and chills for ~2 weeks, saw her PCP 2 days ago and was given prednisone and Azithromycin. Her symptoms have not improved and decided to present to the ED. She denies abdominal pain, has mild nausea but no vomiting. Denies chest pain and palpitations.   Assessment & Plan   Community-acquired pneumonia -Chest x-ray showed streaky right base medially atelectasis or early infiltrate area and mild infrahilar bronchitic changes -Currently afebrile  -Continue azithromycin, ceftriaxone, antitussives, hycodan PRN -Strep pneumonia urine antigen negative -Blood cultures show no growth to date -Repeat CXR today: Right basilar pneumonia.   Probable COPD exacerbation -Patient does smoke  -Bronchitic changes noted on chest x-ray -Continue Solu-Medrol, nebs, antibiotics as above -Pulmonology consulted and appreciated, started patient on lasix, adjusted budesonide, added tussionex BID for cough -Will likely need PFTs as an outpatient -Patient does have strange nodule noted on CXR and CT.  Spoke with pulmonology, recommended repeat CT chest in 3 months.  -will ask RN to monitor O2 sats at rest and with ambulation  Leukocytosis -Likely reactive to steroids vs infection -WBC improving, currently 10.6 -Patient currently afebrile  Dyspnea -Multifactorial including possible COPD, possible sleep apnea, community acquired pneumonia -Continue treatment and plan as above -Echocardiogram: EF [id number], grade 1 diastolic dysfunction -Patient  currently appears to be euvolemic   Chest pain -Likely musculoskeletal, made worse with coughing, tender to palpation.  Possible sleep apnea -Will need sleep study upon discharge -case management consulted  Diabetes mellitus, type II -Metformin held -Continue lantus, ISS and CBG monitoring -Hemoglobin A1c 6.9 -Added 8u TID with meals as patient is on steroids  Obesity -Patient to discuss lifestyle modifications with primary care physician upon discharge  Essential hypertension -Continue HCTZ, lisinopril and verapamil  Hyperlipidemia -Continue statin  Nasal congestion -Continue flonase  DVT Prophylaxis  Lovenox  Code Status: Full  Family Communication: None at bedside  Disposition Plan: Continue current treatment, likely discharge within 48 hours  Consultants Pulmonology  Procedures  Echocardiogram  Antibiotics   Anti-infectives    Start     Dose/Rate Route Frequency Ordered Stop   06/07/16 1200  azithromycin (ZITHROMAX) tablet 500 mg     500 mg Oral Daily 06/07/16 1128 06/10/16 0959   06/04/16 1200  cefTRIAXone (ROCEPHIN) 1 g in dextrose 5 % 50 mL IVPB     1 g 100 mL/hr over 30 Minutes Intravenous Every 24 hours 06/04/16 1040 06/11/16 1159   06/04/16 1200  azithromycin (ZITHROMAX) 500 mg in dextrose 5 % 250 mL IVPB  Status:  Discontinued     500 mg 250 mL/hr over 60 Minutes Intravenous Every 24 hours 06/04/16 1040 06/07/16 1128   06/04/16 1045  levofloxacin (LEVAQUIN) IVPB 750 mg  Status:  Discontinued     750 mg 100 mL/hr over 90 Minutes Intravenous  Once 06/04/16 1032 06/04/16 1047   06/04/16 1030  cefTRIAXone (ROCEPHIN) 1 g in dextrose 5 % 50 mL IVPB  Status:  Discontinued     1 g 100 mL/hr over 30 Minutes Intravenous  Once 06/04/16 1024 06/04/16 1031   06/04/16 1030  azithromycin (ZITHROMAX) tablet 500 mg  Status:  Discontinued     500 mg Oral  Once 06/04/16 1024 06/04/16 1031      Subjective:   Sharon Swanson seen and examined today.  Patient  feels her breathing and cough have improved. Was able to walk a bit outside of her room.  Continues to have cough, but nonproductive. Denies abdominal pain, nausea, vomiting, diarrhea, constipation, chest pain, headache, dizziness.  Objective:   Vitals:   06/08/16 0100 06/08/16 0446 06/08/16 0840 06/08/16 0846  BP:  139/62    Pulse:  79    Resp:  20    Temp:  98.5 F (36.9 C)    TempSrc:  Oral    SpO2: 93% 92% 96% 96%  Weight:      Height:        Intake/Output Summary (Last 24 hours) at 06/08/16 1103 Last data filed at 06/08/16 1053  Gross per 24 hour  Intake              650 ml  Output             2650 ml  Net            -2000 ml   Filed Weights   06/04/16 0736  Weight: 132.9 kg (293 lb)    Exam  General: Well developed, well nourished, NAD  HEENT: NCAT,  mucous membranes moist.   Cardiovascular: S1 S2 auscultated, no murmurs, RRR  Respiratory: Diffuse expiratory wheezing, improving. Diminished breath sounds  Abdomen: Soft, obese, nontender, nondistended, + bowel sounds, +umbilical hernia  Extremities: warm dry without cyanosis clubbing, or edema  Neuro: AAOx3, nonfocal  Psych: Appropriate mood and affect, pleasant   Data Reviewed: I have personally reviewed following labs and imaging studies  CBC:  Recent Labs Lab 06/04/16 0810 06/06/16 0453 06/07/16 0451 06/08/16 0433  WBC 6.3 11.2* 10.2 10.6*  NEUTROABS 3.5  --   --   --   HGB 14.8 15.7* 16.2* 16.6*  HCT 46.6* 49.2* 49.2* 52.2*  MCV 88.8 88.6 86.0 88.5  PLT 214 260 254 [id number]   Basic Metabolic Panel:  Recent Labs Lab 06/04/16 0810 06/06/16 0453 06/07/16 0451 06/08/16 0433  NA 139 134* 136 136  K 4.0 5.2* 4.6 4.8  CL 102 96* 94* 92*  CO2 28 30 32 35*  GLUCOSE 91 224* 152* 170*  BUN 16 20 21* 32*  CREATININE 0.66 0.78 0.64 0.77  CALCIUM 9.2 9.7 9.7 9.7   GFR: Estimated Creatinine Clearance: 113 mL/min (by C-G formula based on SCr of 0.77 mg/dL). Liver Function Tests:  Recent  Labs Lab 06/04/16 0810  AST 20  ALT 33  ALKPHOS 55  BILITOT 0.6  PROT 8.1  ALBUMIN 4.0   No results for input(s): LIPASE, AMYLASE in the last 168 hours. No results for input(s): AMMONIA in the last 168 hours. Coagulation Profile: No results for input(s): INR, PROTIME in the last 168 hours. Cardiac Enzymes:  Recent Labs Lab 06/04/16 0810  TROPONINI <0.03   BNP (last 3 results) No results for input(s): PROBNP in the last 8760 hours. HbA1C:  Recent Labs  06/06/16 0453  HGBA1C 6.9*   CBG:  Recent Labs Lab 06/05/16 2155 06/06/16 1153 06/06/16 2201 06/07/16 0717 06/07/16 1150  GLUCAP 305* 308* 303* 144* 218*   Lipid Profile: No results for input(s): CHOL, HDL, LDLCALC, TRIG, CHOLHDL, LDLDIRECT in the last 72 hours. Thyroid Function Tests: No  results for input(s): TSH, T4TOTAL, FREET4, T3FREE, THYROIDAB in the last 72 hours. Anemia Panel: No results for input(s): VITAMINB12, FOLATE, FERRITIN, TIBC, IRON, RETICCTPCT in the last 72 hours. Urine analysis:    Component Value Date/Time   COLORURINE YELLOW 06/04/2016 0750   APPEARANCEUR CLEAR 06/04/2016 0750   LABSPEC 1.012 06/04/2016 0750   PHURINE 5.5 06/04/2016 0750   GLUCOSEU NEGATIVE 06/04/2016 0750   GLUCOSEU NEGATIVE 08/12/2012 1516   HGBUR NEGATIVE 06/04/2016 0750   BILIRUBINUR NEGATIVE 06/04/2016 0750   KETONESUR NEGATIVE 06/04/2016 0750   PROTEINUR 30 (A) 06/04/2016 0750   UROBILINOGEN 1.0 08/17/2014 1522   NITRITE NEGATIVE 06/04/2016 0750   LEUKOCYTESUR NEGATIVE 06/04/2016 0750   Sepsis Labs: @LABRCNTIP (procalcitonin:4,lacticidven:4)  ) Recent Results (from the past 240 hour(s))  Culture, blood (routine x 2) Call MD if unable to obtain prior to antibiotics being given     Status: None (Preliminary result)   Collection Time: 06/04/16 10:50 AM  Result Value Ref Range Status   Specimen Description BLOOD LEFT ANTECUBITAL  Final   Special Requests NONE  Final   Culture   Final    NO GROWTH 3  DAYS Performed at [facility]    Report Status PENDING  Incomplete  Culture, blood (routine x 2) Call MD if unable to obtain prior to antibiotics being given     Status: None (Preliminary result)   Collection Time: 06/04/16 11:10 AM  Result Value Ref Range Status   Specimen Description BLOOD RIGHT ANTECUBITAL  Final   Special Requests NONE  Final   Culture   Final    NO GROWTH 3 DAYS Performed at [facility]    Report Status PENDING  Incomplete      Radiology Studies: Dg Chest 2 View  Result Date: 06/08/2016 CLINICAL DATA:  Shortness of breath and weakness. Two weeks of productive cough, chest congestion, fever, and chills. Began prednisone and azithromycin 3 days ago. Persistent symptoms. History of asthma, current smoker. EXAM: CHEST  2 VIEW COMPARISON:  PA and lateral chest x-ray of June 04, 2016 FINDINGS: The lungs are adequately inflated. There is linear increased density at the right lung base consistent with atelectasis or residual infiltrate. The left lung is clear. The heart and pulmonary vascularity are normal. There is calcification in the wall of the aortic arch. The mediastinum is normal in width. There is multilevel degenerative disc disease of the thoracic spine. There is chronic stable prominence of the inferior aspect of the left sternoclavicular joint. IMPRESSION: Right basilar pneumonia. This is slightly less conspicuous than on the previous study. Aortic atherosclerosis. Electronically Signed   By: David  Martinique M.D.   On: 06/08/2016 09:51     Scheduled Meds: . azithromycin  500 mg Oral Daily  . budesonide (PULMICORT) nebulizer solution  0.5 mg Nebulization BID  . cefTRIAXone (ROCEPHIN)  IV  1 g Intravenous Q24H  . chlorpheniramine-HYDROcodone  5 mL Oral Q12H  . enoxaparin (LOVENOX) injection  0.5 mg/kg Subcutaneous Q24H  . fluticasone  1 spray Each Nare Daily  . hydrochlorothiazide  12.5 mg Oral Daily  . insulin aspart  0-20 Units Subcutaneous  TID WC  . insulin aspart  0-5 Units Subcutaneous QHS  . insulin aspart  8 Units Subcutaneous TID WC  . insulin glargine  18 Units Subcutaneous Daily  . ipratropium-albuterol  3 mL Nebulization QID  . lisinopril  2.5 mg Oral Daily  . methylPREDNISolone (SOLU-MEDROL) injection  60 mg Intravenous Q12H  . nystatin  5 mL  Oral QID  . pravastatin  40 mg Oral Daily  . verapamil  240 mg Oral Daily   Continuous Infusions:    LOS: 3 days   Time Spent in minutes   30 minutes  Sharon Swanson D.O. on 06/08/2016 at 11:03 AM  Between 7am to 7pm - Pager - [phone]  After 7pm go to [email or web address] - password [email or web address]  And look for the night coverage person covering for me after hours  [facility] Office  [phone]

## 2016-06-08 NOTE — Progress Notes (Signed)
SATURATION QUALIFICATIONS: (This note is used to comply with regulatory documentation for home oxygen)  Patient Saturations on Room Air at Rest = 96%  Patient Saturations on Room Air while Ambulating = 92%  Patient Saturations on 2 Liters of oxygen while Ambulating = 95%  Please briefly explain why patient needs home oxygen:

## 2016-06-09 LAB — CBC
HCT: 47.6 % — ABNORMAL HIGH (ref 36.0–46.0)
Hemoglobin: 16.4 g/dL — ABNORMAL HIGH (ref 12.0–15.0)
MCH: 29.9 pg (ref 26.0–34.0)
MCHC: 34.5 g/dL (ref 30.0–36.0)
MCV: 86.7 fL (ref 78.0–100.0)
PLATELETS: 256 10*3/uL (ref 150–400)
RBC: 5.49 MIL/uL — ABNORMAL HIGH (ref 3.87–5.11)
RDW: 14.7 % (ref 11.5–15.5)
WBC: 11.5 10*3/uL — AB (ref 4.0–10.5)

## 2016-06-09 LAB — PROCALCITONIN: Procalcitonin: <0.1 ng/mL

## 2016-06-09 LAB — BASIC METABOLIC PANEL
Anion gap: 9 (ref 5–15)
BUN: 32 mg/dL — AB (ref 6–20)
CALCIUM: 9.2 mg/dL (ref 8.9–10.3)
CO2: 32 mmol/L (ref 22–32)
CREATININE: 0.78 mg/dL (ref 0.44–1.00)
Chloride: 93 mmol/L — ABNORMAL LOW (ref 101–111)
GFR calc Af Amer: >60 mL/min (ref >60)
GLUCOSE: 162 mg/dL — AB (ref 65–99)
Potassium: 4.8 mmol/L (ref 3.5–5.1)
SODIUM: 134 mmol/L — AB (ref 135–145)

## 2016-06-09 LAB — CULTURE, BLOOD (ROUTINE X 2)
CULTURE: NO GROWTH
CULTURE: NO GROWTH

## 2016-06-09 LAB — GLUCOSE, CAPILLARY
GLUCOSE-CAPILLARY: 183 mg/dL — AB (ref 65–99)
Glucose-Capillary: 194 mg/dL — ABNORMAL HIGH (ref 65–99)
Glucose-Capillary: 209 mg/dL — ABNORMAL HIGH (ref 65–99)
Glucose-Capillary: 277 mg/dL — ABNORMAL HIGH (ref 65–99)

## 2016-06-09 NOTE — Progress Notes (Signed)
Patient ambulated in hallway with staff  02 sats on room air 97%  02 sats while ambulating: 95%  2L 02 while ambulating: 100%

## 2016-06-09 NOTE — Progress Notes (Signed)
PROGRESS NOTE    Sharon Swanson  [id number] DOB: 15-May-1961 DOA: 06/04/2016 PCP: Pcp Not In System   Chief Complaint  Patient presents with  . Nasal Congestion  . Cough    Brief Narrative:  HPI on 06/04/2016 by Dr. Marzetta [name] is a 55 y.o. female with medical history significant of tobacco abuse, obesity, HTN, HLD, DM, presents to the ER with complaints of shortness of breath. Patient has been having a productive cough / congestion / fever and chills for ~2 weeks, saw her PCP 2 days ago and was given prednisone and Azithromycin. Her symptoms have not improved and decided to present to the ED. She denies abdominal pain, has mild nausea but no vomiting. Denies chest pain and palpitations.   Assessment & Plan   Community-acquired pneumonia -Chest x-ray showed streaky right base medially atelectasis or early infiltrate area and mild infrahilar bronchitic changes -Currently afebrile  -Continue azithromycin, ceftriaxone (day 6/7), antitussives, hycodan PRN -Strep pneumonia urine antigen negative -Blood cultures show no growth to date -Repeat CXR 9/14: Right basilar pneumonia.   Probable COPD exacerbation -Patient does smoke  -Bronchitic changes noted on chest x-ray -Continue Solu-Medrol, nebs, antibiotics as above -Pulmonology consulted and appreciated, started patient on lasix, adjusted budesonide, added tussionex BID for cough -Will need PFTs as an outpatient, PCCM has arranged -Patient does have strange nodule noted on CXR and CT.  Spoke with pulmonology, recommended repeat CT chest in 3 months.  - wean O2 as able  Leukocytosis -Likely reactive to steroids vs infection -WBC improving -Patient currently afebrile  Dyspnea -Multifactorial including possible COPD, possible sleep apnea, community acquired pneumonia -Continue treatment and plan as above -Echocardiogram: EF [id number], grade 1 diastolic dysfunction -Patient currently appears to be euvolemic    Chest pain -Likely musculoskeletal, made worse with coughing, tender to palpation.  Possible sleep apnea -Will need sleep study upon discharge -case management consulted  Diabetes mellitus, type II -Metformin held -Continue lantus, ISS and CBG monitoring -Hemoglobin A1c 6.9 -Added 8u TID with meals as patient is on steroids  Obesity -Patient to discuss lifestyle modifications with primary care physician upon discharge  Essential hypertension -Continue HCTZ, lisinopril and verapamil  Hyperlipidemia -Continue statin  Nasal congestion -Continue flonase  DVT Prophylaxis  Lovenox  Code Status: Full  Family Communication: None at bedside  Disposition Plan: Continue current treatment, likely discharge in AM upon completion of abx.  Consultants Pulmonology  Procedures  Echocardiogram  Antibiotics   Anti-infectives    Start     Dose/Rate Route Frequency Ordered Stop   06/07/16 1200  azithromycin (ZITHROMAX) tablet 500 mg     500 mg Oral Daily 06/07/16 1128 06/09/16 1029   06/04/16 1200  cefTRIAXone (ROCEPHIN) 1 g in dextrose 5 % 50 mL IVPB     1 g 100 mL/hr over 30 Minutes Intravenous Every 24 hours 06/04/16 1040 06/11/16 1159   06/04/16 1200  azithromycin (ZITHROMAX) 500 mg in dextrose 5 % 250 mL IVPB  Status:  Discontinued     500 mg 250 mL/hr over 60 Minutes Intravenous Every 24 hours 06/04/16 1040 06/07/16 1128   06/04/16 1045  levofloxacin (LEVAQUIN) IVPB 750 mg  Status:  Discontinued     750 mg 100 mL/hr over 90 Minutes Intravenous  Once 06/04/16 1032 06/04/16 1047   06/04/16 1030  cefTRIAXone (ROCEPHIN) 1 g in dextrose 5 % 50 mL IVPB  Status:  Discontinued     1 g 100 mL/hr over 30 Minutes Intravenous  Once 06/04/16  1024 06/04/16 1031   06/04/16 1030  azithromycin (ZITHROMAX) tablet 500 mg  Status:  Discontinued     500 mg Oral  Once 06/04/16 1024 06/04/16 1031      Subjective:   Sharon Swanson seen and examined today.  Patient feels her breathing and  cough have improved overall, has been coughing a lot though and ambulating some in halls. Still on 2L  oxygen.  Objective:   Vitals:   06/09/16 0845 06/09/16 1145 06/09/16 1456 06/09/16 1457  BP:    (!) 158/82  Pulse:    76  Resp:    18  Temp:    98 F (36.7 C)  TempSrc:    Oral  SpO2: 95% 96% 96% 96%  Weight:      Height:        Intake/Output Summary (Last 24 hours) at 06/09/16 1557 Last data filed at 06/09/16 1458  Gross per 24 hour  Intake             1320 ml  Output             1800 ml  Net             -480 ml   Filed Weights   06/04/16 0736  Weight: 132.9 kg (293 lb)    Exam  General: Well developed, well nourished, NAD  HEENT: NCAT,  mucous membranes moist.   Cardiovascular: S1 S2 auscultated, no murmurs, RRR  Respiratory: Minimal diffuse expiratory wheezing. Diminished breath sounds  Abdomen: Soft, obese, nontender, nondistended, + bowel sounds, +umbilical hernia  Extremities: warm dry without cyanosis clubbing, or edema  Neuro: AAOx3, nonfocal  Psych: Appropriate mood and affect, pleasant   Data Reviewed: I have personally reviewed following labs and imaging studies  CBC:  Recent Labs Lab 06/04/16 0810 06/06/16 0453 06/07/16 0451 06/08/16 0433 06/09/16 0505  WBC 6.3 11.2* 10.2 10.6* 11.5*  NEUTROABS 3.5  --   --   --   --   HGB 14.8 15.7* 16.2* 16.6* 16.4*  HCT 46.6* 49.2* 49.2* 52.2* 47.6*  MCV 88.8 88.6 86.0 88.5 86.7  PLT 214 260 254 275 [id number]   Basic Metabolic Panel:  Recent Labs Lab 06/04/16 0810 06/06/16 0453 06/07/16 0451 06/08/16 0433 06/09/16 0505  NA 139 134* 136 136 134*  K 4.0 5.2* 4.6 4.8 4.8  CL 102 96* 94* 92* 93*  CO2 28 30 32 35* 32  GLUCOSE 91 224* 152* 170* 162*  BUN 16 20 21* 32* 32*  CREATININE 0.66 0.78 0.64 0.77 0.78  CALCIUM 9.2 9.7 9.7 9.7 9.2   GFR: Estimated Creatinine Clearance: 113 mL/min (by C-G formula based on SCr of 0.78 mg/dL). Liver Function Tests:  Recent Labs Lab 06/04/16 0810  AST  20  ALT 33  ALKPHOS 55  BILITOT 0.6  PROT 8.1  ALBUMIN 4.0   No results for input(s): LIPASE, AMYLASE in the last 168 hours. No results for input(s): AMMONIA in the last 168 hours. Coagulation Profile: No results for input(s): INR, PROTIME in the last 168 hours. Cardiac Enzymes:  Recent Labs Lab 06/04/16 0810  TROPONINI <0.03   BNP (last 3 results) No results for input(s): PROBNP in the last 8760 hours. HbA1C: No results for input(s): HGBA1C in the last 72 hours. CBG:  Recent Labs Lab 06/07/16 0717 06/07/16 1150 06/08/16 2140 06/09/16 0743 06/09/16 1142  GLUCAP 144* 218* 289* 183* 194*   Lipid Profile: No results for input(s): CHOL, HDL, LDLCALC, TRIG, CHOLHDL, LDLDIRECT in the  last 72 hours. Thyroid Function Tests: No results for input(s): TSH, T4TOTAL, FREET4, T3FREE, THYROIDAB in the last 72 hours. Anemia Panel: No results for input(s): VITAMINB12, FOLATE, FERRITIN, TIBC, IRON, RETICCTPCT in the last 72 hours. Urine analysis:    Component Value Date/Time   COLORURINE YELLOW 06/04/2016 0750   APPEARANCEUR CLEAR 06/04/2016 0750   LABSPEC 1.012 06/04/2016 0750   PHURINE 5.5 06/04/2016 0750   GLUCOSEU NEGATIVE 06/04/2016 0750   GLUCOSEU NEGATIVE 08/12/2012 1516   HGBUR NEGATIVE 06/04/2016 0750   BILIRUBINUR NEGATIVE 06/04/2016 0750   KETONESUR NEGATIVE 06/04/2016 0750   PROTEINUR 30 (A) 06/04/2016 0750   UROBILINOGEN 1.0 08/17/2014 1522   NITRITE NEGATIVE 06/04/2016 0750   LEUKOCYTESUR NEGATIVE 06/04/2016 0750   Sepsis Labs: @LABRCNTIP (procalcitonin:4,lacticidven:4)  ) Recent Results (from the past 240 hour(s))  Culture, blood (routine x 2) Call MD if unable to obtain prior to antibiotics being given     Status: None (Preliminary result)   Collection Time: 06/04/16 10:50 AM  Result Value Ref Range Status   Specimen Description BLOOD LEFT ANTECUBITAL  Final   Special Requests NONE  Final   Culture   Final    NO GROWTH 4 DAYS Performed at [facility]    Report Status PENDING  Incomplete  Culture, blood (routine x 2) Call MD if unable to obtain prior to antibiotics being given     Status: None (Preliminary result)   Collection Time: 06/04/16 11:10 AM  Result Value Ref Range Status   Specimen Description BLOOD RIGHT ANTECUBITAL  Final   Special Requests NONE  Final   Culture   Final    NO GROWTH 4 DAYS Performed at [facility]    Report Status PENDING  Incomplete      Radiology Studies: Dg Chest 2 View  Result Date: 06/08/2016 CLINICAL DATA:  Shortness of breath and weakness. Two weeks of productive cough, chest congestion, fever, and chills. Began prednisone and azithromycin 3 days ago. Persistent symptoms. History of asthma, current smoker. EXAM: CHEST  2 VIEW COMPARISON:  PA and lateral chest x-ray of June 04, 2016 FINDINGS: The lungs are adequately inflated. There is linear increased density at the right lung base consistent with atelectasis or residual infiltrate. The left lung is clear. The heart and pulmonary vascularity are normal. There is calcification in the wall of the aortic arch. The mediastinum is normal in width. There is multilevel degenerative disc disease of the thoracic spine. There is chronic stable prominence of the inferior aspect of the left sternoclavicular joint. IMPRESSION: Right basilar pneumonia. This is slightly less conspicuous than on the previous study. Aortic atherosclerosis. Electronically Signed   By: David  Martinique M.D.   On: 06/08/2016 09:51     Scheduled Meds: . budesonide (PULMICORT) nebulizer solution  0.5 mg Nebulization BID  . cefTRIAXone (ROCEPHIN)  IV  1 g Intravenous Q24H  . chlorpheniramine-HYDROcodone  5 mL Oral Q12H  . enoxaparin (LOVENOX) injection  0.5 mg/kg Subcutaneous Q24H  . fluticasone  1 spray Each Nare Daily  . hydrochlorothiazide  12.5 mg Oral Daily  . insulin aspart  0-20 Units Subcutaneous TID WC  . insulin aspart  0-5 Units Subcutaneous QHS  . insulin  aspart  8 Units Subcutaneous TID WC  . insulin glargine  18 Units Subcutaneous Daily  . ipratropium-albuterol  3 mL Nebulization QID  . lisinopril  2.5 mg Oral Daily  . methylPREDNISolone (SOLU-MEDROL) injection  60 mg Intravenous Q12H  . nystatin  5 mL Oral  QID  . pravastatin  40 mg Oral Daily  . verapamil  240 mg Oral Daily   Continuous Infusions:    LOS: 4 days   Time Spent in minutes   23 minutes  Trenisha Lafavor Marry Guan MD on 06/09/2016 at 3:57 PM  Between 7am to 7pm - Pager - [phone]  After 7pm go to [email or web address] - password [email or web address]  And look for the night coverage person covering for me after hours  [facility] Office  [phone]

## 2016-06-09 NOTE — Progress Notes (Signed)
Name: Sharon Swanson MRN: [id number] DOB: 1961-03-09    ADMISSION DATE:  06/04/2016 CONSULTATION DATE:  06/07/16  REFERRING MD :  Dr. Ree Kida / [facility]   CHIEF COMPLAINT:  Concern for Sleep Apnea    HISTORY OF PRESENT ILLNESS:  55 y/o morbidly obese female (BMI ), smoker (1/2 ppd for 35 years, began at age 85) with PMH of HTN, HLD, DM, fatty liver, fibroids, pancreatitis, ventral hernia childhood asthma & GERD who presented to [facility] on 9/10 with a 2 week history of worsening cough, sputum production, fever/chills.    Initial evaluation concerning for right basilar infiltrate vs atelectasis.  She was also found to have significant wheezing on exam worrisome for COPD exacerbation.  The patient was admitted by [facility] for CAP + COPD exacerbation. She was treated with IV ceftriaxone & azithromycin for CAP.  Bronchodilators, IV steroids for COPD exacerbation. Urine strep antigen and HIV assessed and negative.  Blood cultures to date negative. The patient continued to have dyspnea with exertion despite therapy.  ECHO was assessed and demonstrated moderate LV concentric hypertrophy, normal systolic function, LVEF 0000000, no RWMA, grade 1 diastolic dysfunction.  I/O review shows the patient is 2.6L positive for the admission (at baseline, she takes lasix 20 mg QD and HCTZ 12.5 QD).  Hospitalist MD also concerned for possible sleep apnea.    PCCM consulted for pulmonary evaluation of dyspnea, new O2 requirement and possible OSA.  Pt reports feeling some better since admit but not back to baseline.  She feels weak/lightheaded with activity.  Reports ongoing dry cough.  Reports snoring.  No daytime tiredness or excessive daytime sleep.  If she sits down, she does fall asleep / nap. Denies hx of autoimmune diseases, blood clots.    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  Denies fevers / chills / sputum production.   VITAL SIGNS: Temp:  [97.8 F (36.6 C)-98.2 F (36.8 C)] 98.1 F (36.7 C) (09/15 0502) Pulse Rate:  [74-82] 74 (09/15 0502) Resp:  [18] 18 (09/15 0502) BP: (127-140)/(60-77) 140/77 (09/15 0502) SpO2:  [90 %-95 %] 95 % (09/15 0845)  PHYSICAL EXAMINATION: General:  Morbidly obese female in NAD Neuro:  AAOx4, speech clear, flat affect, MAE, normal strength  HEENT:  MM pink/moist, no appreciable JVD, white plaques on posterior pharynx resolved Cardiovascular:  s1s2 rrr, no MRG Lungs:  Even/non-labored, clear bilaterally  Abdomen:  Obese/soft, bsx4 active  Musculoskeletal:  No acute deformities  Skin:  Warm/dry, trace BLE edema    Recent Labs Lab 06/07/16 0451 06/08/16 0433 06/09/16 0505  NA 136 136 134*  K 4.6 4.8 4.8  CL 94* 92* 93*  CO2 32 35* 32  BUN 21* 32* 32*  CREATININE 0.64 0.77 0.78  GLUCOSE 152* 170* 162*    Recent Labs Lab 06/07/16 0451 06/08/16 0433 06/09/16 0505  HGB 16.2* 16.6* 16.4*  HCT 49.2* 52.2* 47.6*  WBC 10.2 10.6* 11.5*  PLT 254 275 256   Dg Chest 2 View  Result Date: 06/08/2016 CLINICAL DATA:  Shortness of breath and weakness. Two weeks of productive cough, chest congestion, fever, and chills. Began prednisone and azithromycin 3 days ago. Persistent symptoms. History of  asthma, current smoker. EXAM: CHEST  2 VIEW COMPARISON:  PA and lateral chest x-ray of June 04, 2016 FINDINGS: The lungs are adequately inflated. There is linear increased density at the right lung base consistent with atelectasis or residual infiltrate. The left lung is clear. The heart and pulmonary vascularity are normal. There is calcification in the wall of the aortic arch. The mediastinum is normal in width. There is multilevel degenerative disc disease of the thoracic spine. There is chronic stable  prominence of the inferior aspect of the left sternoclavicular joint. IMPRESSION: Right basilar pneumonia. This is slightly less conspicuous than on the previous study. Aortic atherosclerosis. Electronically Signed   By: David  Martinique M.D.   On: 06/08/2016 09:51     SIGNIFICANT EVENTS  9/10  Admit with CAP + suspected COPD exacerbation   STUDIES:  9/11  ECHO >> moderate LV concentric hypertrophy, normal systolic function, LVEF 0000000, no RWMA, grade 1 diastolic dysfunction.   ASSESSMENT / PLAN:  Dyspnea - likely multifactorial in the setting of morbid obesity, underlying obstructive lung disease, possible component of volume (on baseline lasix), RLL CAP and deconditioning at baseline.    Plan: ABX for PNA  Treat COPD exacerbation    RLL CAP   Plan: Continue rocephin / azithromycin  D6/x abx, recommend 7 days total  Pulmonary hygiene - IS, mobilize   Suspected COPD  Cough ? Asthma   Plan: Continue solumedrol  Duonebs Q6  Budesonide 0.5 mg BID  Tussionex BID for cough suppression Assess for ambulatory O2 needs prior to d/c > does not qualify for O2, sats > 92% with ambulation on RA Will need PFT's as outpatient to assess for obstructive disease   LUL Rounded Opacity - noted on CXR and CT from 2015.  Was to follow up in 2015 but lost to f/u.    Plan: Follow up in pulmonary clinic in one month with repeat CT imaging  CXR on follow up with Dr. Vaughan Browner   Possible Sleep Apnea - snoring at night, naps during the day   Plan: Follow up with Pulmonary for further consideration of sleep study  [facility] attempting to organize a sleep study post discharge    Tobacco Abuse   Plan: Smoking cessation counseling    Thrush   Plan: Oral nystatin  Morbid Obesity    Plan: Pt pursuing weight loss as an outpatient.    PCCM will sign off.  Please call back if new needs arise.   Follow-up Information    Marshell Garfinkel, MD Follow up on 07/07/2016.   Specialty:  Pulmonary  Disease Why:  Appt at 1:45 PM.   Contact information: [address] [place] [address] [phone]           Brandi Ollis, NP-C [facility] Pgr: [phone] or if no answer [phone] 06/09/2016, 11:48 AM  Attending note: I have seen and examined the patient with nurse practitioner/resident and agree with the note. History, labs and imaging reviewed.  55 Y/O with PMH of obesity, smoker, HTN HLD, DM, asthma, gerd presenting with CAP, COPD exacerbation.  Being treated with antibiotics, steroids. Slow improvement in resp status.  Follow up arranged in clinic for PFTs, sleep study and repeat imaging of the lung. PCCM will be available as needed.  Marshell Garfinkel MD [facility] Pager [phone] If no answer or after 3pm call: [phone] 06/09/2016, 3:27 PM

## 2016-06-10 LAB — GLUCOSE, CAPILLARY
GLUCOSE-CAPILLARY: 106 mg/dL — AB (ref 65–99)
Glucose-Capillary: 147 mg/dL — ABNORMAL HIGH (ref 65–99)

## 2016-06-10 MED ORDER — HYDROCOD POLST-CPM POLST ER 10-8 MG/5ML PO SUER: 5.0000 mL | Freq: Two times a day (BID) | ORAL | 0 refills | Status: DC

## 2016-06-10 MED ORDER — INSULIN ASPART 100 UNIT/ML ~~LOC~~ SOLN: 5.0000 [IU] | Freq: Three times a day (TID) | SUBCUTANEOUS | 11 refills | Status: DC

## 2016-06-10 MED ORDER — NYSTATIN 100000 UNIT/ML MT SUSP: 5.0000 mL | Freq: Four times a day (QID) | OROMUCOSAL | 0 refills | Status: AC

## 2016-06-10 MED ORDER — FLUTICASONE PROPIONATE 50 MCG/ACT NA SUSP: 1.0000 | Freq: Every day | NASAL | 0 refills | Status: DC

## 2016-06-10 MED ORDER — INSULIN GLARGINE 100 UNIT/ML ~~LOC~~ SOLN: 12.0000 [IU] | Freq: Every day | SUBCUTANEOUS | 11 refills | Status: DC

## 2016-06-10 MED ORDER — HYDROCHLOROTHIAZIDE 12.5 MG PO CAPS: 12.5000 mg | ORAL_CAPSULE | Freq: Every day | ORAL | 0 refills | Status: DC

## 2016-06-10 MED ORDER — FREESTYLE SYSTEM KIT: 1.0000 | PACK | 0 refills | Status: DC | PRN

## 2016-06-10 MED ORDER — ALBUTEROL SULFATE HFA 108 (90 BASE) MCG/ACT IN AERS: 2.0000 | INHALATION_SPRAY | RESPIRATORY_TRACT | 0 refills | Status: DC | PRN

## 2016-06-10 MED ORDER — METHYLPREDNISOLONE 4 MG PO TBPK: ORAL_TABLET | ORAL | 0 refills | Status: DC

## 2016-06-10 NOTE — Progress Notes (Signed)
June 10, 2016   Patient: Sharon Swanson  Date of Birth: 1961/05/07  Date of Visit: 06/04/2016    To Whom It May Concern:  Ms. Clute was admitted to the hospital for a medical reason. It is my medical opinion that Shiyan Empey may return to work on Monday 06/19/2016.  If you have any questions or concerns, please don't hesitate to call.  Sincerely,   Nevin Bloodgood, Therapist, sports for [facility] Heron Sabins, MD

## 2016-06-10 NOTE — Care Management Note (Addendum)
Case Management Note  Patient Details  Name: Sharon Swanson MRN: [id number] Date of Birth: Apr 08, 1961  Subjective/Objective:   COPD, CAP                 Action/Plan: Discharge Planning: AVS reviewed:  NCM spoke to pt at bedside. States she will have difficulty paying for medications. Contacted [facility] and pt's medications total was $122.00. States her insulin was covered at a 100%. Pt states she believes she has inhaler and blood pressure medications at home. States she has a glucometer. Plans to pick up Prednisone and insulin today.     Expected Discharge Date:  06/10/2016               Expected Discharge Plan:  Home/Self Care  In-House Referral:  NA  Discharge planning Services  CM Consult  Post Acute Care Choice:  NA Choice offered to:  NA  DME Arranged:  N/A DME Agency:  NA  HH Arranged:  NA HH Agency:  NA  Status of Service:  Completed, signed off  If discussed at [place] of Stay Meetings, dates discussed:    Additional Comments:  Erenest Rasher, RN 06/10/2016, 4:24 PM

## 2016-06-10 NOTE — Discharge Summary (Signed)
Discharge Summary  Sharon Swanson MBW:[id number] DOB: 1961/07/10  PCP: Pcp Not In System  Admit date: 06/04/2016 Discharge date: 06/10/2016   Recommendations for Outpatient Follow-up:  1. PCP 1-2 weeks 2. Pulmonary Dr. Concepcion Living 1-2 weeks   Discharge Diagnoses:  Active Hospital Problems   Diagnosis Date Noted  . COPD exacerbation ([place])   . CAP (community acquired pneumonia) 08/17/2014  . GERD (gastroesophageal reflux disease) 11/08/2011  . Obesity (BMI 30-39.9) 11/08/2011  . Tobacco abuse 11/08/2011    Resolved Hospital Problems   Diagnosis Date Noted Date Resolved  No resolved problems to display.    Discharge Condition: Stable   Diet recommendation: Diabetic   Vitals:   06/09/16 2207 06/10/16 0556  BP: 138/83 126/79  Pulse: 74 74  Resp: 18 18  Temp: 97.8 F (36.6 C) 98.4 F (36.9 C)    History of present illness:  Sharon Mathewsis a 55 y.o.femalewith medical history significant of tobacco abuse, obesity, HTN, HLD, DM, presents to the ER with complaints of shortness of breath. Patient has been having a productive cough / congestion / fever and chills for ~2 weeks, saw her PCP 2 days ago and was given prednisone and Azithromycin. Her symptoms have not improved and decided to present to the ED. She denies abdominal pain, has mild nausea but no vomiting. Denies chest pain and palpitations.    Hospital Course:  Active Problems:   GERD (gastroesophageal reflux disease)   Tobacco abuse   Obesity (BMI 30-39.9)   CAP (community acquired pneumonia)   COPD exacerbation ([place])  Patient was admitted and treated with IV steroids, IV abx Azithromycin and Rocephin. Was seen by pulmonary. They have recommended total 7 days of abx which she has continued. Weaned to room air. She was started on insulin due to elevated blood sugars likely exacerbated by steroids. Will go home today on PO steroid taper, insulin at reduced doses. She will f/u with pulmonary for further imaging and sleep  study.  Procedures:  None   Consultations:  Pulm   Discharge Exam: BP 126/79 (BP Location: Left Arm)   Pulse 74   Temp 98.4 F (36.9 C) (Oral)   Resp 18   Ht _0  (1.702 m)   Wt 132.9 kg (293 lb)   SpO2 98%   BMI 45.89 kg/m  General:  Alert, oriented, calm, in no acute distress  Eyes: pupils round and reactive to light and accomodation, clear sclerea Neck: supple, no masses, trachea mildline  Cardiovascular: RRR, no murmurs or rubs, no peripheral edema  Respiratory: clear to auscultation bilaterally, no wheezes, no crackles  Abdomen: soft, nontender, nondistended, normal bowel tones heard  Skin: dry, no rashes  Musculoskeletal: no joint effusions, normal range of motion  Psychiatric: appropriate affect, normal speech  Neurologic: extraocular muscles intact, clear speech, moving all extremities with intact sensorium    Discharge Instructions You were cared for by a hospitalist during your hospital stay. If you have any questions about your discharge medications or the care you received while you were in the hospital after you are discharged, you can call the unit and asked to speak with the hospitalist on call if the hospitalist that took care of you is not available. Once you are discharged, your primary care physician will handle any further medical issues. Please note that NO REFILLS for any discharge medications will be authorized once you are discharged, as it is imperative that you return to your primary care physician (or establish a relationship with a primary care  physician if you do not have one) for your aftercare needs so that they can reassess your need for medications and monitor your lab values.  Discharge Instructions    Diet - low sodium heart healthy    Complete by:  As directed    Increase activity slowly    Complete by:  As directed        Medication List    STOP taking these medications   amLODipine 10 MG tablet Commonly known as:  NORVASC     azithromycin 250 MG tablet Commonly known as:  ZITHROMAX Z-PAK   benzonatate 100 MG capsule Commonly known as:  TESSALON   cloNIDine 0.1 MG tablet Commonly known as:  CATAPRES   furosemide 20 MG tablet Commonly known as:  LASIX   hydrALAZINE 50 MG tablet Commonly known as:  APRESOLINE   metFORMIN 500 MG tablet Commonly known as:  GLUCOPHAGE   omeprazole 20 MG capsule Commonly known as:  PRILOSEC   predniSONE 10 MG tablet Commonly known as:  DELTASONE   predniSONE 20 MG tablet Commonly known as:  DELTASONE     TAKE these medications   albuterol (2.5 MG/3ML) 0.083% nebulizer solution Commonly known as:  PROVENTIL Take 3 mLs (2.5 mg total) by nebulization every 2 (two) hours as needed for wheezing or shortness of breath.   albuterol 108 (90 Base) MCG/ACT inhaler Commonly known as:  PROVENTIL HFA;VENTOLIN HFA Inhale 2 puffs into the lungs every 4 (four) hours as needed for wheezing or shortness of breath.   chlorpheniramine-HYDROcodone 10-8 MG/5ML Suer Commonly known as:  TUSSIONEX Take 5 mLs by mouth every 12 (twelve) hours.   fluticasone 50 MCG/ACT nasal spray Commonly known as:  FLONASE Place 1 spray into both nostrils daily. Start taking on:  06/11/2016   glucose monitoring kit monitoring kit 1 each by Does not apply route as needed for other.   guaiFENesin 600 MG 12 hr tablet Commonly known as:  MUCINEX Take 600 mg by mouth 2 (two) times daily.   hydrochlorothiazide 12.5 MG capsule Commonly known as:  MICROZIDE Take 1 capsule (12.5 mg total) by mouth daily.   insulin aspart 100 UNIT/ML injection Commonly known as:  novoLOG Inject 5 Units into the skin 3 (three) times daily with meals.   insulin glargine 100 UNIT/ML injection Commonly known as:  LANTUS Inject 0.12 mLs (12 Units total) into the skin daily. Start taking on:  06/11/2016   ipratropium-albuterol 0.5-2.5 (3) MG/3ML Soln Commonly known as:  DUONEB Take 3 mLs by nebulization every 6 (six)  hours.   lisinopril 2.5 MG tablet Commonly known as:  PRINIVIL,ZESTRIL Take 2.5 mg by mouth daily.   methylPREDNISolone 4 MG Tbpk tablet Commonly known as:  MEDROL DOSEPAK Taper per package instructions.   nystatin 100000 UNIT/ML suspension Commonly known as:  MYCOSTATIN Take 5 mLs (500,000 Units total) by mouth 4 (four) times daily.   pravastatin 40 MG tablet Commonly known as:  PRAVACHOL Take 40 mg by mouth daily.   ranitidine 150 MG tablet Commonly known as:  ZANTAC Take 150 mg by mouth 2 (two) times daily.   verapamil 240 MG CR tablet Commonly known as:  CALAN-SR Take 240 mg by mouth daily.      Allergies  Allergen Reactions  . Aspirin     Abdominal pain   Follow-up Information    Marshell Garfinkel, MD Follow up on 07/07/2016.   Specialty:  Pulmonary Disease Why:  Appt at 1:45 PM.   Contact information: [address] [place] [address] [phone]        [facility] .   Why:  please call to arrange appointment Contact information: [address], [place] [phone]           The results of significant diagnostics from this hospitalization (including imaging, microbiology, ancillary and laboratory) are listed below for reference.    Significant Diagnostic Studies: Dg Chest 2 View  Result Date: 06/08/2016 CLINICAL DATA:  Shortness of breath and weakness. Two weeks of productive cough, chest congestion, fever, and chills. Began prednisone and azithromycin 3 days ago. Persistent symptoms. History of asthma, current smoker. EXAM: CHEST  2 VIEW COMPARISON:  PA and lateral chest x-ray of June 04, 2016 FINDINGS: The lungs are adequately inflated. There is linear increased density at the right lung base consistent with atelectasis or residual infiltrate. The left lung is clear. The heart and pulmonary vascularity are normal. There is calcification in the wall of the aortic arch. The  mediastinum is normal in width. There is multilevel degenerative disc disease of the thoracic spine. There is chronic stable prominence of the inferior aspect of the left sternoclavicular joint. IMPRESSION: Right basilar pneumonia. This is slightly less conspicuous than on the previous study. Aortic atherosclerosis. Electronically Signed   By: David  Martinique M.D.   On: 06/08/2016 09:51   Dg Chest 2 View  Result Date: 06/04/2016 CLINICAL DATA:  Cough, congestion, shortness of breath for 2 weeks, history of smoking EXAM: CHEST  2 VIEW COMPARISON:  04/13/2016 FINDINGS: Cardiomediastinal silhouette is stable. Mild infrahilar bronchitic changes. There is streaky right base medially atelectasis or early infiltrate. No pulmonary edema. Degenerative changes thoracic spine again noted. IMPRESSION: Streaky right base medially atelectasis or early infiltrate. No pulmonary edema. Mild infrahilar bronchitic changes. Electronically Signed   By: Lahoma Crocker M.D.   On: 06/04/2016 09:46    Microbiology: Recent Results (from the past 240 hour(s))  Culture, blood (routine x 2) Call MD if unable to obtain prior to antibiotics being given     Status: None   Collection Time: 06/04/16 10:50 AM  Result Value Ref Range Status   Specimen Description BLOOD LEFT ANTECUBITAL  Final   Special Requests NONE  Final   Culture   Final    NO GROWTH 5 DAYS Performed at [facility]    Report Status 06/09/2016 FINAL  Final  Culture, blood (routine x 2) Call MD if unable to obtain prior to antibiotics being given     Status: None   Collection Time: 06/04/16 11:10 AM  Result Value Ref Range Status   Specimen Description BLOOD RIGHT ANTECUBITAL  Final   Special Requests NONE  Final   Culture   Final    NO GROWTH 5 DAYS Performed at [facility]    Report Status 06/09/2016 FINAL  Final     Labs: Basic Metabolic Panel:  Recent Labs Lab 06/04/16 0810 06/06/16 0453 06/07/16 0451 06/08/16 0433 06/09/16 0505    NA 139 134* 136 136 134*  K 4.0 5.2* 4.6 4.8 4.8  CL 102 96* 94* 92* 93*  CO2 28 30 32 35* 32  GLUCOSE 91 224* 152* 170* 162*  BUN 16 20 21* 32* 32*  CREATININE 0.66 0.78 0.64 0.77 0.78  CALCIUM 9.2 9.7 9.7 9.7 9.2   Liver Function Tests:  Recent Labs Lab 06/04/16 0810  AST 20  ALT 33  ALKPHOS 55  BILITOT  0.6  PROT 8.1  ALBUMIN 4.0   No results for input(s): LIPASE, AMYLASE in the last 168 hours. No results for input(s): AMMONIA in the last 168 hours. CBC:  Recent Labs Lab 06/04/16 0810 06/06/16 0453 06/07/16 0451 06/08/16 0433 06/09/16 0505  WBC 6.3 11.2* 10.2 10.6* 11.5*  NEUTROABS 3.5  --   --   --   --   HGB 14.8 15.7* 16.2* 16.6* 16.4*  HCT 46.6* 49.2* 49.2* 52.2* 47.6*  MCV 88.8 88.6 86.0 88.5 86.7  PLT 214 260 254 275 256   Cardiac Enzymes:  Recent Labs Lab 06/04/16 0810  TROPONINI <0.03   BNP: BNP (last 3 results) No results for input(s): BNP in the last 8760 hours.  ProBNP (last 3 results) No results for input(s): PROBNP in the last 8760 hours.  CBG:  Recent Labs Lab 06/09/16 1142 06/09/16 1709 06/09/16 2131 06/10/16 0727 06/10/16 1157  GLUCAP 194* 209* 277* 106* 147*    Time spent: 32 minutes were spent in preparing this discharge including medication reconciliation, counseling, and coordination of care.  Signed:  [employer]  [facility] 06/10/2016, 1:18 PM

## 2016-06-15 ENCOUNTER — Other Ambulatory Visit (HOSPITAL_BASED_OUTPATIENT_CLINIC_OR_DEPARTMENT_OTHER): Payer: Self-pay

## 2016-06-15 DIAGNOSIS — G4730 Sleep apnea, unspecified: Secondary | ICD-10-CM

## 2016-06-15 DIAGNOSIS — R0683 Snoring: Secondary | ICD-10-CM

## 2016-06-16 ENCOUNTER — Ambulatory Visit (HOSPITAL_BASED_OUTPATIENT_CLINIC_OR_DEPARTMENT_OTHER): Payer: BC Managed Care – PPO | Attending: Internal Medicine | Admitting: Internal Medicine

## 2016-06-16 VITALS — Ht 62.0 in | Wt 260.0 lb

## 2016-06-16 DIAGNOSIS — R0902 Hypoxemia: Secondary | ICD-10-CM | POA: Diagnosis not present

## 2016-06-16 DIAGNOSIS — G4733 Obstructive sleep apnea (adult) (pediatric): Secondary | ICD-10-CM | POA: Diagnosis not present

## 2016-06-16 DIAGNOSIS — R0683 Snoring: Secondary | ICD-10-CM | POA: Diagnosis present

## 2016-06-16 DIAGNOSIS — J449 Chronic obstructive pulmonary disease, unspecified: Secondary | ICD-10-CM

## 2016-06-16 DIAGNOSIS — G4730 Sleep apnea, unspecified: Secondary | ICD-10-CM

## 2016-06-16 DIAGNOSIS — G4739 Other sleep apnea: Secondary | ICD-10-CM | POA: Insufficient documentation

## 2016-06-24 DIAGNOSIS — G4733 Obstructive sleep apnea (adult) (pediatric): Secondary | ICD-10-CM | POA: Diagnosis not present

## 2016-06-24 DIAGNOSIS — J449 Chronic obstructive pulmonary disease, unspecified: Secondary | ICD-10-CM

## 2016-06-24 NOTE — Procedures (Signed)
  Patient Name: Sharon Swanson, [place] Date: 06/16/2016 Gender: Female D.O.B: 1961/01/05 Age (years): 43 Referring Provider: MaryAnn Mikhail Height (inches): 67 Interpreting Physician: Baird Lyons MD, ABSM Weight (lbs): 260 RPSGT: Jonna Coup BMI: 41 MRN: [id number] Neck Size: 16.50 CLINICAL INFORMATION Sleep Study Type: NPSG Indication for sleep study: COPD, Daytime Fatigue, Diabetes, Fatigue, Hypertension, Obesity, Snoring, Witnesses Apnea / Gasping During Sleep Epworth Sleepiness Score: 5  SLEEP STUDY TECHNIQUE As per the AASM Manual for the Scoring of Sleep and Associated Events v2.3 (April 2016) with a hypopnea requiring 4% desaturations. The channels recorded and monitored were frontal, central and occipital EEG, electrooculogram (EOG), submentalis EMG (chin), nasal and oral airflow, thoracic and abdominal wall motion, anterior tibialis EMG, snore microphone, electrocardiogram, and pulse oximetry.  MEDICATIONS Patient's medications include: charted for review. Medications self-administered by patient during sleep study : Novolog, medrol, insulin SLEEP ARCHITECTURE The study was initiated at 10:46:56 PM and ended at 4:47:23 AM. Sleep onset time was 3.4 minutes and the sleep efficiency was 70.2%. The total sleep time was 253.0 minutes. Stage REM latency was 20.5 minutes. The patient spent 0.99% of the night in stage N1 sleep, 76.09% in stage N2 sleep, 0.00% in stage N3 and 22.92% in REM. Alpha intrusion was absent. Supine sleep was 49.58%. Wake after sleep onset 104 minutes  RESPIRATORY PARAMETERS The overall apnea/hypopnea index (AHI) was 18.0 per hour. There were 4 total apneas, including 3 obstructive, 1 central and 0 mixed apneas. There were 72 hypopneas and 8 RERAs. The AHI during Stage REM sleep was 45.5 per hour. AHI while supine was 10.5 per hour. The mean oxygen saturation was 87.70%. The minimum SpO2 during sleep was 65.00%. Moderate snoring was noted  during this study.  CARDIAC DATA The 2 lead EKG demonstrated sinus rhythm. The mean heart rate was 86.26 beats per minute. Other EKG findings include: None.  LEG MOVEMENT DATA The total PLMS were 1 with a resulting PLMS index of 0.24. Associated arousal with leg movement index was 0.2 .  IMPRESSIONS - Moderate obstructive sleep apnea occurred during this study (AHI = 18.0/h). - No significant central sleep apnea occurred during this study (CAI = 0.2/h). - Severe oxygen desaturation was noted during this study (Min O2 = 65.00%, Mean saturation 87.7%). - The patient snored with Moderate snoring volume. - No cardiac abnormalities were noted during this study. - Clinically significant periodic limb movements did not occur during sleep. No significant associated arousals.  DIAGNOSIS - Obstructive Sleep Apnea (327.23 [G47.33 ICD-10]) - Nocturnal Hypoxemia (327.26 [G47.36 ICD-10])  RECOMMENDATIONS - Therapeutic CPAP titration to determine optimal pressure required to alleviate sleep disordered breathing. - Avoid alcohol, sedatives and other CNS depressants that may worsen sleep apnea and disrupt normal sleep architecture. - Sleep hygiene should be reviewed to assess factors that may improve sleep quality. - Weight management and regular exercise should be initiated or continued if appropriate.  [Electronically signed] 06/24/2016 08:49 AM  Baird Lyons MD, ABSM Diplomate, American Board of Sleep Medicine   NPI: [id number]  [place], American Board of Sleep Medicine  ELECTRONICALLY SIGNED ON:  06/24/2016, 8:42 AM [facility] PH: [phone]   FX: [phone] [place]

## 2016-06-27 ENCOUNTER — Ambulatory Visit (INDEPENDENT_AMBULATORY_CARE_PROVIDER_SITE_OTHER): Payer: BC Managed Care – PPO | Admitting: Pulmonary Disease

## 2016-06-27 VITALS — BP 146/90 | HR 105 | Ht 62.0 in | Wt 295.8 lb

## 2016-06-27 DIAGNOSIS — J441 Chronic obstructive pulmonary disease with (acute) exacerbation: Secondary | ICD-10-CM | POA: Diagnosis not present

## 2016-06-27 DIAGNOSIS — G4733 Obstructive sleep apnea (adult) (pediatric): Secondary | ICD-10-CM

## 2016-06-27 DIAGNOSIS — R911 Solitary pulmonary nodule: Secondary | ICD-10-CM

## 2016-06-27 MED ORDER — FLUTICASONE-SALMETEROL 250-50 MCG/DOSE IN AEPB
1.0000 | INHALATION_SPRAY | Freq: Two times a day (BID) | RESPIRATORY_TRACT | 5 refills | Status: DC
Start: 2016-06-27 — End: 2016-12-20

## 2016-06-27 NOTE — Progress Notes (Signed)
Sharon Swanson    [id number]    1961/02/04  Primary Care Physician:Pcp Not In System  Referring Physician: No referring provider defined for this encounter.  Chief complaint:  Follow up after hopsitalizations  HPI: 55 y/o morbidly obese female (BMI ), smoker (1/2 ppd for 35 years, began at age 4) with PMH of HTN, HLD, DM, fatty liver, fibroids, pancreatitis, ventral hernia childhood asthma & GERD who was admitted to [facility] on 06/04/16 with RLL CAP, COPD exacerbation  Since her discharge she feels that her breathing has improved but is not back to baseline. She still has occasional cough with sputum production, dyspnea, wheezing. She denies any fevers, chills, hemoptysis. She's had a recent sleep study that shows moderate sleep apnea.  Outpatient Encounter Prescriptions as of 06/27/2016  Medication Sig  . albuterol (PROVENTIL HFA;VENTOLIN HFA) 108 (90 Base) MCG/ACT inhaler Inhale 2 puffs into the lungs every 4 (four) hours as needed for wheezing or shortness of breath.  Marland Kitchen albuterol (PROVENTIL) (2.5 MG/3ML) 0.083% nebulizer solution Take 3 mLs (2.5 mg total) by nebulization every 2 (two) hours as needed for wheezing or shortness of breath.  . chlorpheniramine-HYDROcodone (TUSSIONEX) 10-8 MG/5ML SUER Take 5 mLs by mouth every 12 (twelve) hours.  . fluticasone (FLONASE) 50 MCG/ACT nasal spray Place 1 spray into both nostrils daily.  Marland Kitchen glucose monitoring kit (FREESTYLE) monitoring kit 1 each by Does not apply route as needed for other.  . guaiFENesin (MUCINEX) 600 MG 12 hr tablet Take 600 mg by mouth 2 (two) times daily.  . hydrochlorothiazide (MICROZIDE) 12.5 MG capsule Take 1 capsule (12.5 mg total) by mouth daily.  . insulin aspart (NOVOLOG) 100 UNIT/ML injection Inject 5 Units into the skin 3 (three) times daily with meals.  . insulin glargine (LANTUS) 100 UNIT/ML injection Inject 0.12 mLs (12 Units total) into the skin daily.  Marland Kitchen ipratropium-albuterol (DUONEB) 0.5-2.5 (3) MG/3ML  SOLN Take 3 mLs by nebulization every 6 (six) hours.  Marland Kitchen lisinopril (PRINIVIL,ZESTRIL) 2.5 MG tablet Take 2.5 mg by mouth daily.  . pravastatin (PRAVACHOL) 40 MG tablet Take 40 mg by mouth daily.  . ranitidine (ZANTAC) 150 MG tablet Take 150 mg by mouth 2 (two) times daily.  . verapamil (CALAN-SR) 240 MG CR tablet Take 240 mg by mouth daily.  . [DISCONTINUED] methylPREDNISolone (MEDROL DOSEPAK) 4 MG TBPK tablet Taper per package instructions.   No facility-administered encounter medications on file as of 06/27/2016.     Allergies as of 06/27/2016 - Review Complete 06/27/2016  Allergen Reaction Noted  . Aspirin  10/21/2011    Past Medical History:  Diagnosis Date  . Acid reflux   . Asthma   . Fatty liver 11/08/2011  . Fibroids 11/08/2011  . Hypertension   . Obesity (BMI 30-39.9) 11/08/2011  . Pancreatitis 11/08/2011  . Ventral hernia     Past Surgical History:  Procedure Laterality Date  . ORTHOPEDIC SURGERY     R ankle, tendonitis    Family History  Problem Relation Age of Onset  . Hypertension Mother   . Diabetes Other     Social History   Social History  . Marital status: Single    Spouse name: N/A  . Number of children: N/A  . Years of education: N/A   Occupational History  . Not on file.   Social History Main Topics  . Smoking status: Current Every Day Smoker    Packs/day: 0.50    Types: Cigarettes  . Smokeless tobacco:  Never Used  . Alcohol use No  . Drug use: No  . Sexual activity: No   Other Topics Concern  . Not on file   Social History Narrative  . No narrative on file     Review of systems: Review of Systems  Constitutional: Negative for fever and chills.  HENT: Negative.   Eyes: Negative for blurred vision.  Respiratory: as per HPI  Cardiovascular: Negative for chest pain and palpitations.  Gastrointestinal: Negative for vomiting, diarrhea, blood per rectum. Genitourinary: Negative for dysuria, urgency, frequency and hematuria.    Musculoskeletal: Negative for myalgias, back pain and joint pain.  Skin: Negative for itching and rash.  Neurological: Negative for dizziness, tremors, focal weakness, seizures and loss of consciousness.  Endo/Heme/Allergies: Negative for environmental allergies.  Psychiatric/Behavioral: Negative for depression, suicidal ideas and hallucinations.  All other systems reviewed and are negative.   Physical Exam: Blood pressure (!) 146/90, pulse (!) 105, height _0  (1.575 m), weight 295 lb 12.8 oz (134.2 kg), SpO2 97 %. Gen:      No acute distress HEENT:  EOMI, sclera anicteric Neck:     No masses; no thyromegaly Lungs:    Clear :         Regular rate and rhythm; no murmurs Abd:      + bowel sounds; soft, non-tender; no palpable masses, no distension Ext:    No edema; adequate peripheral perfusion Skin:      Warm and dry; no rash Neuro: alert and oriented x 3 Psych: normal mood and affect  Data Reviewed: CT chest 08/17/14- multifocal groundglass opacities, left upper lobe nodule or changes. Images reviewed. CXR 06/04/16-right lower lobe opacity suggestive of pneumonia. Images reviewed. Echo 06/05/16- moderate LV concentric hypertrophy, normal systolic function, LVEF 47-18%, no RWMA, grade 1 diastolic dysfunction  Sleep study 06/16/16 Moderate OSA, AHI 18 Severe O2 desaturations.  Assessment:  #1 Recent admission for CAP, COPD exacerbation. She is maintained on albuterol twice daily. I'll start her on a long-term controller medication with Advair 250/50. She'll get scheduled for PFTs. Smoking cessation encouraged. Review of her past lung imaging shows nodular opacities in the left upper lobe that'll need to be followed. I'll schedule her for CT of the chest without contrast.  #2 OSA Moderate OSA with oxygen desaturation. I'll start her on an AutoSet CPAP 5-15. Return to clinic in 3 months with download to assess compliance and response to therapy.  Plan/Recommendations: - Autoset CPAP  5-15 - Start advair 250/50 - Schedule PFTs and CT chest.  Marshell Garfinkel MD [facility] Pager [phone] 06/27/2016, 2:15 PM  CC: No ref. provider found

## 2016-06-27 NOTE — Patient Instructions (Signed)
We will start on Advair 250/50. You will get scheduled for pulmonary function tests and a CT of the chest without contrast. We'll start CPAP AutoSet 5-15 with fullface mask for treatment of your sleep apnea.  Return to clinic in 3 months with CPAP download for evaluation of response to therapy.

## 2016-06-29 ENCOUNTER — Ambulatory Visit (INDEPENDENT_AMBULATORY_CARE_PROVIDER_SITE_OTHER)
Admission: RE | Admit: 2016-06-29 | Discharge: 2016-06-29 | Disposition: A | Discharge status: Home / Self Care | Payer: BC Managed Care – PPO | Source: Ambulatory Visit | Attending: Pulmonary Disease | Admitting: Pulmonary Disease

## 2016-06-29 DIAGNOSIS — R911 Solitary pulmonary nodule: Secondary | ICD-10-CM | POA: Diagnosis not present

## 2016-07-04 ENCOUNTER — Encounter: Payer: Self-pay | Admitting: Pulmonary Disease

## 2016-07-06 NOTE — Progress Notes (Signed)
Called spoke with patient, advised of CT results / recs as stated by PM.  Pt verbalized her understanding and denied any questions. 

## 2016-08-04 ENCOUNTER — Other Ambulatory Visit (HOSPITAL_COMMUNITY): Payer: BC Managed Care – PPO

## 2016-08-04 ENCOUNTER — Encounter (HOSPITAL_COMMUNITY): Payer: Self-pay | Admitting: Emergency Medicine

## 2016-08-04 ENCOUNTER — Emergency Department (HOSPITAL_COMMUNITY)
Admission: EM | Admit: 2016-08-04 | Discharge: 2016-08-05 | Disposition: A | Discharge status: Home / Self Care | Payer: BC Managed Care – PPO | Attending: Emergency Medicine | Admitting: Emergency Medicine

## 2016-08-04 ENCOUNTER — Emergency Department (HOSPITAL_COMMUNITY): Payer: BC Managed Care – PPO

## 2016-08-04 DIAGNOSIS — F17210 Nicotine dependence, cigarettes, uncomplicated: Secondary | ICD-10-CM | POA: Insufficient documentation

## 2016-08-04 DIAGNOSIS — Z6841 Body Mass Index (BMI) 40.0 and over, adult: Secondary | ICD-10-CM | POA: Diagnosis not present

## 2016-08-04 DIAGNOSIS — Z79899 Other long term (current) drug therapy: Secondary | ICD-10-CM | POA: Insufficient documentation

## 2016-08-04 DIAGNOSIS — H81399 Other peripheral vertigo, unspecified ear: Secondary | ICD-10-CM | POA: Diagnosis not present

## 2016-08-04 DIAGNOSIS — R42 Dizziness and giddiness: Secondary | ICD-10-CM

## 2016-08-04 DIAGNOSIS — I10 Essential (primary) hypertension: Secondary | ICD-10-CM | POA: Insufficient documentation

## 2016-08-04 DIAGNOSIS — Z794 Long term (current) use of insulin: Secondary | ICD-10-CM | POA: Diagnosis not present

## 2016-08-04 DIAGNOSIS — E119 Type 2 diabetes mellitus without complications: Secondary | ICD-10-CM | POA: Diagnosis not present

## 2016-08-04 DIAGNOSIS — K760 Fatty (change of) liver, not elsewhere classified: Secondary | ICD-10-CM | POA: Insufficient documentation

## 2016-08-04 DIAGNOSIS — E669 Obesity, unspecified: Secondary | ICD-10-CM | POA: Insufficient documentation

## 2016-08-04 LAB — URINALYSIS, ROUTINE W REFLEX MICROSCOPIC
Bilirubin Urine: NEGATIVE
GLUCOSE, UA: NEGATIVE mg/dL
KETONES UR: NEGATIVE mg/dL
Leukocytes, UA: NEGATIVE
Nitrite: NEGATIVE
PH: 6 (ref 5.0–8.0)
PROTEIN: 30 mg/dL — AB
Specific Gravity, Urine: 1.014 (ref 1.005–1.030)

## 2016-08-04 LAB — CBC
HCT: 46.1 % — ABNORMAL HIGH (ref 36.0–46.0)
HEMOGLOBIN: 14.9 g/dL (ref 12.0–15.0)
MCH: 29 pg (ref 26.0–34.0)
MCHC: 32.3 g/dL (ref 30.0–36.0)
MCV: 89.7 fL (ref 78.0–100.0)
Platelets: 207 10*3/uL (ref 150–400)
RBC: 5.14 MIL/uL — AB (ref 3.87–5.11)
RDW: 15.4 % (ref 11.5–15.5)
WBC: 4.1 10*3/uL (ref 4.0–10.5)

## 2016-08-04 LAB — RAPID URINE DRUG SCREEN, HOSP PERFORMED
Amphetamines: NOT DETECTED
BARBITURATES: NOT DETECTED
BENZODIAZEPINES: NOT DETECTED
COCAINE: NOT DETECTED
OPIATES: NOT DETECTED
TETRAHYDROCANNABINOL: NOT DETECTED

## 2016-08-04 LAB — DIFFERENTIAL
Basophils Absolute: 0 10*3/uL (ref 0.0–0.1)
Basophils Relative: 1 %
EOS PCT: 5 %
Eosinophils Absolute: 0.2 10*3/uL (ref 0.0–0.7)
LYMPHS PCT: 32 %
Lymphs Abs: 1.3 10*3/uL (ref 0.7–4.0)
MONO ABS: 0.5 10*3/uL (ref 0.1–1.0)
MONOS PCT: 12 %
Neutro Abs: 2.1 10*3/uL (ref 1.7–7.7)
Neutrophils Relative %: 52 %

## 2016-08-04 LAB — COMPREHENSIVE METABOLIC PANEL
ALBUMIN: 3.8 g/dL (ref 3.5–5.0)
ALT: 25 U/L (ref 14–54)
ANION GAP: 7 (ref 5–15)
AST: 21 U/L (ref 15–41)
Alkaline Phosphatase: 48 U/L (ref 38–126)
BUN: 12 mg/dL (ref 6–20)
CHLORIDE: 106 mmol/L (ref 101–111)
CO2: 28 mmol/L (ref 22–32)
Calcium: 9 mg/dL (ref 8.9–10.3)
Creatinine, Ser: 0.64 mg/dL (ref 0.44–1.00)
GFR calc Af Amer: >60 mL/min (ref >60)
GFR calc non Af Amer: >60 mL/min (ref >60)
GLUCOSE: 103 mg/dL — AB (ref 65–99)
POTASSIUM: 4.1 mmol/L (ref 3.5–5.1)
SODIUM: 141 mmol/L (ref 135–145)
Total Bilirubin: 0.4 mg/dL (ref 0.3–1.2)
Total Protein: 7.3 g/dL (ref 6.5–8.1)

## 2016-08-04 LAB — I-STAT CHEM 8, ED
BUN: 15 mg/dL (ref 6–20)
CHLORIDE: 106 mmol/L (ref 101–111)
CREATININE: 0.6 mg/dL (ref 0.44–1.00)
Calcium, Ion: 1.07 mmol/L — ABNORMAL LOW (ref 1.15–1.40)
Glucose, Bld: 102 mg/dL — ABNORMAL HIGH (ref 65–99)
HEMATOCRIT: 47 % — AB (ref 36.0–46.0)
Hemoglobin: 16 g/dL — ABNORMAL HIGH (ref 12.0–15.0)
POTASSIUM: 4.9 mmol/L (ref 3.5–5.1)
Sodium: 141 mmol/L (ref 135–145)
TCO2: 29 mmol/L (ref 0–100)

## 2016-08-04 LAB — URINE MICROSCOPIC-ADD ON: WBC, UA: NONE SEEN WBC/hpf (ref 0–5)

## 2016-08-04 LAB — CBG MONITORING, ED: GLUCOSE-CAPILLARY: 91 mg/dL (ref 65–99)

## 2016-08-04 LAB — I-STAT TROPONIN, ED: Troponin i, poc: 0.01 ng/mL (ref 0.00–0.08)

## 2016-08-04 LAB — BASIC METABOLIC PANEL
ANION GAP: 6 (ref 5–15)
BUN: 13 mg/dL (ref 6–20)
CALCIUM: 9.1 mg/dL (ref 8.9–10.3)
CHLORIDE: 107 mmol/L (ref 101–111)
CO2: 28 mmol/L (ref 22–32)
CREATININE: 0.63 mg/dL (ref 0.44–1.00)
GFR calc Af Amer: >60 mL/min (ref >60)
GFR calc non Af Amer: >60 mL/min (ref >60)
GLUCOSE: 105 mg/dL — AB (ref 65–99)
Potassium: 4 mmol/L (ref 3.5–5.1)
Sodium: 141 mmol/L (ref 135–145)

## 2016-08-04 LAB — PROTIME-INR
INR: 0.88
Prothrombin Time: 11.9 seconds (ref 11.4–15.2)

## 2016-08-04 LAB — ETHANOL: Alcohol, Ethyl (B): <5 mg/dL (ref <5)

## 2016-08-04 LAB — APTT: APTT: 35 s (ref 24–36)

## 2016-08-04 MED ORDER — DIAZEPAM 5 MG/ML IJ SOLN
5.0000 mg | Freq: Once | INTRAMUSCULAR | Status: AC
  Administered 2016-08-04: 5 mg via INTRAVENOUS
  Filled 2016-08-04: qty 2

## 2016-08-04 NOTE — ED Notes (Signed)
Pt transported to MRI 

## 2016-08-04 NOTE — ED Notes (Signed)
Pt came back from MRI and MRI tech informed ED staff that pt could not fit in MRI

## 2016-08-04 NOTE — ED Notes (Signed)
ED Provider at bedside. 

## 2016-08-04 NOTE — ED Provider Notes (Addendum)
[facility] DEPT Provider Note   CSN: [id number] Arrival date & time: 08/04/16  1607     History   Chief Complaint Chief Complaint  Patient presents with  . Dizziness  . Nausea    HPI Sharon Swanson is a 55 y.o. female.  HPI 55 y.o.femalewith medical history significant of tobacco abuse, obesity, HTN, HLD, DM, presents to the ER with cc of dizziness. Last normal at 3 pm. Pt reports that she started felling dizzy at 3 pm. Dizziness is described as constant lightheadedness when she is laying, that gets worse when she gets up. When pt gets up, she gets vertigo like symptoms which are also constant. Pt has associated nausea. Denies any numbness, tingling, vision complains. Pt did feel like she might faint on occasion.   Past Medical History:  Diagnosis Date  . Acid reflux   . Asthma   . Fatty liver 11/08/2011  . Fibroids 11/08/2011  . Hypertension   . Obesity (BMI 30-39.9) 11/08/2011  . Pancreatitis 11/08/2011  . Ventral hernia     Patient Active Problem List   Diagnosis Date Noted  . COPD exacerbation ([place])   . CAP (community acquired pneumonia) 08/17/2014  . Acute respiratory failure ([place]) 08/17/2014  . Umbilical hernia 10/03/3233  . Pulmonary nodule, left 08/17/2014  . Constipation, chronic 08/15/2012  . Pancreatitis 11/08/2011  . GERD (gastroesophageal reflux disease) 11/08/2011  . Tobacco abuse 11/08/2011  . Fatty liver 11/08/2011  . Obesity (BMI 30-39.9) 11/08/2011  . Fibroids 11/08/2011    Past Surgical History:  Procedure Laterality Date  . ORTHOPEDIC SURGERY     R ankle, tendonitis    OB History    Gravida Para Term Preterm AB Living   0 0 0 0 0 0   SAB TAB Ectopic Multiple Live Births   0 0 0 0         Home Medications    Prior to Admission medications   Medication Sig Start Date End Date Taking? Authorizing Provider  albuterol (PROVENTIL HFA;VENTOLIN HFA) 108 (90 Base) MCG/ACT inhaler Inhale 2 puffs into the lungs every 4 (four) hours as  needed for wheezing or shortness of breath. 06/10/16  Yes Mir Marry Guan, MD  albuterol (PROVENTIL) (2.5 MG/3ML) 0.083% nebulizer solution Take 3 mLs (2.5 mg total) by nebulization every 2 (two) hours as needed for wheezing or shortness of breath. 08/22/14  Yes Hosie Poisson, MD  Fluticasone-Salmeterol (ADVAIR DISKUS) 250-50 MCG/DOSE AEPB Inhale 1 puff into the lungs 2 (two) times daily. 06/27/16  Yes Praveen Mannam, MD  hydrochlorothiazide (MICROZIDE) 12.5 MG capsule Take 1 capsule (12.5 mg total) by mouth daily. 06/10/16  Yes Mir Marry Guan, MD  insulin aspart (NOVOLOG) 100 UNIT/ML injection Inject 5 Units into the skin 3 (three) times daily with meals. 06/10/16  Yes Mir Marry Guan, MD  insulin glargine (LANTUS) 100 UNIT/ML injection Inject 0.12 mLs (12 Units total) into the skin daily. 06/11/16  Yes Mir Marry Guan, MD  ipratropium-albuterol (DUONEB) 0.5-2.5 (3) MG/3ML SOLN Take 3 mLs by nebulization every 6 (six) hours. 08/22/14  Yes Hosie Poisson, MD  pravastatin (PRAVACHOL) 40 MG tablet Take 40 mg by mouth daily. 04/27/16 04/27/17 Yes Historical Provider, MD  ranitidine (ZANTAC) 150 MG tablet Take 150 mg by mouth 2 (two) times daily. 04/25/16 04/25/17 Yes Historical Provider, MD  verapamil (CALAN-SR) 240 MG CR tablet Take 240 mg by mouth daily. 06/02/16  Yes Historical Provider, MD  chlorpheniramine-HYDROcodone (TUSSIONEX) 10-8 MG/5ML SUER Take 5 mLs by mouth every 12 (  twelve) hours. Patient not taking: Reported on 08/04/2016 06/10/16   Mir Marry Guan, MD  fluticasone [facility]) 50 MCG/ACT nasal spray Place 1 spray into both nostrils daily. Patient not taking: Reported on 08/04/2016 06/11/16   Mir Marry Guan, MD  glucose monitoring kit (FREESTYLE) monitoring kit 1 each by Does not apply route as needed for other. 06/10/16   Mir Marry Guan, MD  metFORMIN (GLUCOPHAGE) 500 MG tablet Take 500 mg by mouth 2 (two) times daily with a meal. 04/27/16   Historical  Provider, MD    Family History Family History  Problem Relation Age of Onset  . Hypertension Mother   . Diabetes Other     Social History Social History  Substance Use Topics  . Smoking status: Current Every Day Smoker    Packs/day: 0.50    Types: Cigarettes  . Smokeless tobacco: Never Used  . Alcohol use No     Allergies   Aspirin   Review of Systems Review of Systems  ROS 10 Systems reviewed and are negative for acute change except as noted in the HPI.    Physical Exam Updated Vital Signs BP 119/68   Pulse 81   Temp 97.9 F (36.6 C)   Resp 15   Ht 5' 7"  (1.702 m)   Wt 289 lb (131.1 kg)   SpO2 95%   BMI 45.26 kg/m   Physical Exam  Constitutional: She is oriented to person, place, and time. She appears well-developed.  HENT:  Head: Normocephalic and atraumatic.  Eyes: Conjunctivae and EOM are normal. Pupils are equal, round, and reactive to light.  Neck: Normal range of motion. Neck supple.  Cardiovascular: Normal rate, regular rhythm and normal heart sounds.   Pulmonary/Chest: Effort normal and breath sounds normal. No respiratory distress.  Abdominal: Soft. Bowel sounds are normal. She exhibits no distension. There is no tenderness. There is no rebound and no guarding.  Neurological: She is alert and oriented to person, place, and time. No cranial nerve deficit. Coordination normal.  NIHSS - 0  Skin: Skin is warm and dry.  Nursing note and vitals reviewed.    ED Treatments / Results  Labs (all labs ordered are listed, but only abnormal results are displayed) Labs Reviewed  BASIC METABOLIC PANEL - Abnormal; Notable for the following:       Result Value   Glucose, Bld 105 (*)    All other components within normal limits  CBC - Abnormal; Notable for the following:    RBC 5.14 (*)    HCT 46.1 (*)    All other components within normal limits  COMPREHENSIVE METABOLIC PANEL - Abnormal; Notable for the following:    Glucose, Bld 103 (*)    All  other components within normal limits  URINALYSIS, ROUTINE W REFLEX MICROSCOPIC (NOT AT [facility]) - Abnormal; Notable for the following:    APPearance CLOUDY (*)    Hgb urine dipstick TRACE (*)    Protein, ur 30 (*)    All other components within normal limits  URINE MICROSCOPIC-ADD ON - Abnormal; Notable for the following:    Squamous Epithelial / LPF 0-5 (*)    Bacteria, UA RARE (*)    Crystals CA OXALATE CRYSTALS (*)    All other components within normal limits  I-STAT CHEM 8, ED - Abnormal; Notable for the following:    Glucose, Bld 102 (*)    Calcium, Ion 1.07 (*)    Hemoglobin 16.0 (*)    HCT 47.0 (*)  All other components within normal limits  ETHANOL  PROTIME-INR  APTT  RAPID URINE DRUG SCREEN, HOSP PERFORMED  DIFFERENTIAL  CBG MONITORING, ED  I-STAT TROPOININ, ED    EKG  EKG Interpretation  Date/Time:  Friday August 04 2016 16:28:43 EST Ventricular Rate:  84 PR Interval:    QRS Duration: 89 QT Interval:  406 QTC Calculation: 480 R Axis:   -29 Text Interpretation:  Sinus rhythm Probable left atrial enlargement Borderline left axis deviation Abnormal R-wave progression, late transition Borderline T wave abnormalities No acute changes No significant change since last tracing Confirmed by Kathrynn Humble, MD, Thelma Comp [phone]) on 08/04/2016 6:55:53 PM       Radiology Ct Head Code Stroke W/o Cm  Result Date: 08/04/2016 CLINICAL DATA:  Code stroke. Dizziness and nausea up beginning 2 hours ago. EXAM: CT HEAD WITHOUT CONTRAST TECHNIQUE: Contiguous axial images were obtained from the base of the skull through the vertex without intravenous contrast. COMPARISON:  None. FINDINGS: Brain: Normal without evidence of malformation, atrophy, old or acute infarction, mass lesion, hemorrhage, hydrocephalus or extra-axial collection. Vascular: No vascular calcification or hyperdense vessels. Skull: Normal Sinuses/Orbits: Clear/normal Other: None significant ASPECTS ([place] Stroke Program  Early CT Score) - Ganglionic level infarction (caudate, lentiform nuclei, internal capsule, insula, M1-M3 cortex): 7 - Supraganglionic infarction (M4-M6 cortex): 3 Total score (0-10 with 10 being normal): 10 IMPRESSION: 1. Normal head CT 2. ASPECTS is 10 These results were called by telephone at the time of interpretation on 08/04/2016 at 5:19 pm to Dr. Varney Biles , who verbally acknowledged these results. Electronically Signed   By: Nelson Chimes M.D.   On: 08/04/2016 17:21    Procedures Procedures (including critical care time)  Medications Ordered in ED Medications  diazepam (VALIUM) injection 5 mg (5 mg Intravenous Given 08/04/16 1934)     Initial Impression / Assessment and Plan / ED Course  I have reviewed the triage vital signs and the nursing notes.  Pertinent labs & imaging results that were available during my care of the patient were reviewed by me and considered in my medical decision making (see chart for details).  Clinical Course as of Aug 05 136  Ludwig Clarks Aug 04, 2016  1921 PT not fitting in or MRI. Dr. Lindell Noe at [facility] accepting.  MRI tech reports that patient "just" missed the cut on girth, and [facility]. Pt made aware. If MRI neg, pt will be discharged with meclizine, epley and ENT follow up.  [AN]  Sat Aug 05, 2016  0135 Pt still has constant dizziness - symptoms improved post valium but didn't resolve.  [AN]    Clinical Course User Index [AN] Varney Biles, MD    DDx includes: Central vertigo:  Tumor  Stroke  ICH  Vertebrobasilar TIA  Peripheral Vertigo:  BPPV  Vestibular neuritis  Meniere disease  Migrainous vertigo  Ear Infection   Pt with constant dizziness, worse with position and associated nausea. Pt has some saccadic eye movement, but not clearly lateralizing. Cerebellar exam - finger to nose and heel to shin are normal. Spoke with Dr. Drenda Freeze, Neurology. Pt is not a candidate for TPA as she doesn't have a life  threatening event, he recommends MRI and transfer to [facility] if there is an acute stroke.  Final Clinical Impressions(s) / ED Diagnoses   Final diagnoses:  Vertigo    New Prescriptions New Prescriptions   No medications on file     Varney Biles, MD 08/04/16 1721  Varney Biles, MD 08/05/16 [phone]

## 2016-08-04 NOTE — ED Notes (Signed)
Pt transport to CT  

## 2016-08-04 NOTE — ED Triage Notes (Addendum)
Per EMS pt complaint of dizziness and nausea onset 1500 post walking to neighbors. Pt given 500 mg 0.9% NaCl and 4 mg of Zofran IV en route to ED. Pt reports decreased dizziness and nausea post interventions. EKG and neuro unremarkable.

## 2016-08-04 NOTE — ED Notes (Signed)
[facility] called in regard of pt queue status and was advised that it would be several hours before pt would be transported via [facility].

## 2016-08-04 NOTE — ED Notes (Signed)
[facility] was notified of pt's transfer to [facility].

## 2016-08-04 NOTE — ED Notes (Signed)
Bed: [id number] Expected date: 08/04/16 Expected time: 4:03 PM Means of arrival: Ambulance Comments: RM 12 NV Dizzy

## 2016-08-05 ENCOUNTER — Emergency Department (HOSPITAL_COMMUNITY): Payer: BC Managed Care – PPO

## 2016-08-05 MED ORDER — MECLIZINE HCL 25 MG PO TABS: 25.0000 mg | ORAL_TABLET | Freq: Three times a day (TID) | ORAL | 0 refills | Status: DC | PRN

## 2016-08-05 MED ORDER — DIAZEPAM 2 MG PO TABS: 2.0000 mg | ORAL_TABLET | Freq: Two times a day (BID) | ORAL | 0 refills | Status: DC | PRN

## 2016-08-05 NOTE — ED Notes (Signed)
[facility] called and was given report on pt. 

## 2016-08-05 NOTE — ED Notes (Signed)
Electronic signature broken, unable to sign

## 2016-08-05 NOTE — ED Provider Notes (Signed)
Patient was seen and evaluated on arrival to the emergency department.  MR is negative for acute central pathology, provided meclizine and Valium for home care of peripheral vertigo. Ambulatory without difficulty prior to discharge.   Leo Grosser, MD 08/05/16 [phone]

## 2016-08-05 NOTE — ED Notes (Signed)
[facility] here to transport pt to [facility]. 

## 2016-10-09 ENCOUNTER — Telehealth: Payer: Self-pay | Admitting: Pulmonary Disease

## 2016-10-09 NOTE — Telephone Encounter (Signed)
Called [facility] to see why cpap was not provider- per Tiffany at [facility], pt declined cpap d/t cost.   Office visit tomorrow is to follow up on cpap machine, and if she is not on cpap machine than rov is not necessary, unless pt is having breathing issues.  atc pt X2, line rang to fast busy signal.

## 2016-10-09 NOTE — Telephone Encounter (Signed)
I noticed in the last note with Dr. Vaughan Browner that he wanted her to start using a CPAP machine. I called the patient to ask if she was using a machine and she said no. I will route this note to [facility] and Dr. Vaughan Browner so they are aware prior to her appt tomorrow (10/09/2016) at 2pm.

## 2016-10-10 ENCOUNTER — Ambulatory Visit: Payer: BC Managed Care – PPO | Admitting: Pulmonary Disease

## 2016-10-18 NOTE — Telephone Encounter (Signed)
Pt no showed for 1/16 appointment. Pt declined CPAP d/t cost per previous message.    Dr. Vaughan Browner please advise if patient needs to be reschedule or further recs.

## 2016-10-26 NOTE — Telephone Encounter (Signed)
Yes. Reschedule. She also has COPD that will need management. PFTs were ordered at last visit. Please make sure they are done before clinic visit.  PM

## 2016-11-06 NOTE — Telephone Encounter (Signed)
Spoke with patient and informed her she needs ROV and PFT. PFT and ROV were then scheduled for 12/20/2016 at 1000 and 1115. Pt was instructed to be here by 955. Pt had no additional questions. Nothing further needed.

## 2016-12-20 ENCOUNTER — Encounter: Payer: Self-pay | Admitting: Pulmonary Disease

## 2016-12-20 ENCOUNTER — Ambulatory Visit (INDEPENDENT_AMBULATORY_CARE_PROVIDER_SITE_OTHER): Payer: BC Managed Care – PPO | Admitting: Pulmonary Disease

## 2016-12-20 VITALS — BP 136/78 | HR 98 | Ht 67.0 in | Wt 301.0 lb

## 2016-12-20 DIAGNOSIS — G4733 Obstructive sleep apnea (adult) (pediatric): Secondary | ICD-10-CM

## 2016-12-20 DIAGNOSIS — J441 Chronic obstructive pulmonary disease with (acute) exacerbation: Secondary | ICD-10-CM | POA: Diagnosis not present

## 2016-12-20 LAB — PULMONARY FUNCTION TEST
DL/VA % pred: 116 %
DL/VA: 5.99 ml/min/mmHg/L
DLCO COR % PRED: 62 %
DLCO cor: 17.69 ml/min/mmHg
DLCO unc % pred: 63 %
DLCO unc: 17.85 ml/min/mmHg
FEF 25-75 POST: 2.3 L/s
FEF 25-75 Pre: 1.85 L/sec
FEF2575-%Change-Post: 24 %
FEF2575-%PRED-POST: 93 %
FEF2575-%Pred-Pre: 75 %
FEV1-%CHANGE-POST: 2 %
FEV1-%Pred-Post: 48 %
FEV1-%Pred-Pre: 47 %
FEV1-Post: 1.19 L
FEV1-Pre: 1.16 L
FEV1FVC-%CHANGE-POST: 3 %
FEV1FVC-%Pred-Pre: 114 %
FEV6-%Change-Post: 0 %
FEV6-%PRED-PRE: 42 %
FEV6-%Pred-Post: 41 %
FEV6-PRE: 1.27 L
FEV6-Post: 1.26 L
FEV6FVC-%Pred-Post: 103 %
FEV6FVC-%Pred-Pre: 103 %
FVC-%CHANGE-POST: 0 %
FVC-%PRED-POST: 40 %
FVC-%PRED-PRE: 40 %
FVC-POST: 1.26 L
FVC-PRE: 1.27 L
POST FEV6/FVC RATIO: 100 %
PRE FEV1/FVC RATIO: 91 %
Post FEV1/FVC ratio: 95 %
Pre FEV6/FVC Ratio: 100 %
RV % pred: 66 %
RV: 1.37 L
TLC % PRED: 58 %
TLC: 3.24 L

## 2016-12-20 MED ORDER — ALBUTEROL SULFATE HFA 108 (90 BASE) MCG/ACT IN AERS: 2.0000 | INHALATION_SPRAY | RESPIRATORY_TRACT | 2 refills | Status: DC | PRN

## 2016-12-20 MED ORDER — FLUTICASONE-SALMETEROL 250-50 MCG/DOSE IN AEPB: 1.0000 | INHALATION_SPRAY | Freq: Two times a day (BID) | RESPIRATORY_TRACT | 5 refills | Status: DC

## 2016-12-20 NOTE — Progress Notes (Signed)
Sharon Swanson    [id number]    04-18-1961  Primary Care Physician:Jennifer Owens Shark, NP  Referring Physician: No referring provider defined for this encounter.  Chief complaint:   Follow up for dyspnea OSA  HPI: 56 y/o morbidly obese female, smoker (1/2 ppd for 35 years, began at age 50) with PMH of HTN, HLD, DM, fatty liver, fibroids, pancreatitis, ventral hernia childhood asthma & GERD who was admitted to [facility] on 06/04/16 with RLL CAP, COPD exacerbation  Since her discharge she feels that her breathing has improved but is not back to baseline. She still has occasional cough with sputum production, dyspnea, wheezing. She denies any fevers, chills, hemoptysis. She's had a recent sleep study that shows moderate sleep apnea.  Interim History: CPAP ordered at last visit but has not been started yet. She ran out off Advair last month and notices worsening dyspnea with wheezing. She denies any cough, sputum production, fevers, chills, chest pain, palpitations.  Outpatient Encounter Prescriptions as of 12/20/2016  Medication Sig  . albuterol (PROVENTIL HFA;VENTOLIN HFA) 108 (90 Base) MCG/ACT inhaler Inhale 2 puffs into the lungs every 4 (four) hours as needed for wheezing or shortness of breath.  Marland Kitchen albuterol (PROVENTIL) (2.5 MG/3ML) 0.083% nebulizer solution Take 3 mLs (2.5 mg total) by nebulization every 2 (two) hours as needed for wheezing or shortness of breath.  . chlorpheniramine-HYDROcodone (TUSSIONEX) 10-8 MG/5ML SUER Take 5 mLs by mouth every 12 (twelve) hours.  . diazepam (VALIUM) 2 MG tablet Take 1 tablet (2 mg total) by mouth every 12 (twelve) hours as needed (dizziness).  . fluticasone (FLONASE) 50 MCG/ACT nasal spray Place 1 spray into both nostrils daily.  . Fluticasone-Salmeterol (ADVAIR DISKUS) 250-50 MCG/DOSE AEPB Inhale 1 puff into the lungs 2 (two) times daily.  Marland Kitchen glucose monitoring kit (FREESTYLE) monitoring kit 1 each by Does not apply route as needed for other.  .  hydrochlorothiazide (MICROZIDE) 12.5 MG capsule Take 1 capsule (12.5 mg total) by mouth daily.  . insulin aspart (NOVOLOG) 100 UNIT/ML injection Inject 5 Units into the skin 3 (three) times daily with meals.  . insulin glargine (LANTUS) 100 UNIT/ML injection Inject 0.12 mLs (12 Units total) into the skin daily.  Marland Kitchen ipratropium-albuterol (DUONEB) 0.5-2.5 (3) MG/3ML SOLN Take 3 mLs by nebulization every 6 (six) hours.  . meclizine (ANTIVERT) 25 MG tablet Take 1 tablet (25 mg total) by mouth 3 (three) times daily as needed for dizziness.  . metFORMIN (GLUCOPHAGE) 500 MG tablet Take 500 mg by mouth 2 (two) times daily with a meal.  . pravastatin (PRAVACHOL) 40 MG tablet Take 40 mg by mouth daily.  . ranitidine (ZANTAC) 150 MG tablet Take 150 mg by mouth 2 (two) times daily.  . verapamil (CALAN-SR) 240 MG CR tablet Take 240 mg by mouth daily.   No facility-administered encounter medications on file as of 12/20/2016.     Allergies as of 12/20/2016 - Review Complete 12/20/2016  Allergen Reaction Noted  . Aspirin  10/21/2011    Past Medical History:  Diagnosis Date  . Acid reflux   . Asthma   . Fatty liver 11/08/2011  . Fibroids 11/08/2011  . Hypertension   . Obesity (BMI 30-39.9) 11/08/2011  . Pancreatitis 11/08/2011  . Ventral hernia     Past Surgical History:  Procedure Laterality Date  . ORTHOPEDIC SURGERY     R ankle, tendonitis    Family History  Problem Relation Age of Onset  . Hypertension Mother   .  Diabetes Other     Social History   Social History  . Marital status: Single    Spouse name: N/A  . Number of children: N/A  . Years of education: N/A   Occupational History  . Not on file.   Social History Main Topics  . Smoking status: Current Every Day Smoker    Packs/day: 0.50    Types: Cigarettes  . Smokeless tobacco: Never Used  . Alcohol use No  . Drug use: No  . Sexual activity: No   Other Topics Concern  . Not on file   Social History Narrative  . No  narrative on file   Review of systems: Review of Systems  Constitutional: Negative for fever and chills.  HENT: Negative.   Eyes: Negative for blurred vision.  Respiratory: as per HPI  Cardiovascular: Negative for chest pain and palpitations.  Gastrointestinal: Negative for vomiting, diarrhea, blood per rectum. Genitourinary: Negative for dysuria, urgency, frequency and hematuria.  Musculoskeletal: Negative for myalgias, back pain and joint pain.  Skin: Negative for itching and rash.  Neurological: Negative for dizziness, tremors, focal weakness, seizures and loss of consciousness.  Endo/Heme/Allergies: Negative for environmental allergies.  Psychiatric/Behavioral: Negative for depression, suicidal ideas and hallucinations.  All other systems reviewed and are negative.   Physical Exam: Blood pressure 136/78, pulse 98, height 5' 7"  (1.702 m), weight (!) 301 lb (136.5 kg), SpO2 96 %. Gen:      No acute distress HEENT:  EOMI, sclera anicteric Neck:     No masses; no thyromegaly Lungs:    Clear to auscultation bilaterally; normal respiratory effort CV:         Regular rate and rhythm; no murmurs Abd:      + bowel sounds; soft, non-tender; no palpable masses, no distension Ext:    No edema; adequate peripheral perfusion Skin:      Warm and dry; no rash Neuro: alert and oriented x 3 Psych: normal mood and affect  Data Reviewed: CT chest 08/17/14- multifocal groundglass opacities, left upper lobe nodule or changes. CXR 06/04/16-right lower lobe opacity suggestive of pneumonia. Echo 06/05/16- moderate LV concentric hypertrophy, normal systolic function, LVEF 45-03%, no RWMA, grade 1 diastolic dysfunction CT scan 06/29/16-resolution of consolidation and lung opacities, mild bronchiectasis in the right middle lobe. Suggestion of airtrapping I have reviewed all images personally.  Sleep study 06/16/16 Moderate OSA, AHI 18 Severe O2 desaturations.  PFTs 12/20/16 FVC 1.26 (40%) FEV1 1.19  (48%) F/F 95 TLC 58% DLCO 63%, corrected DLCO 116% Moderate restriction with diffusion defect.  Assessment:  #1 Dyspnea secondary to obesity, small airway disease. She is a diagnosis of childhood asthma but lung function tests do not show any obstruction. There is a suggestion of small airway disease given air trapping on CT scan and reduction in mid flow rates. She has responded well to the Advair and albuterol PRN and will continue the same. Check FENO, blood allergy profile at next visit.  The PFTs also show restriction with DLCO impairment. However the DLCO corrects for alveolar volume. There is no evidence of interstitial lung disease on CT scan. I believe the PFT abnormalities are secondary to obesity and body habitus which are contributing to dyspnea..   #2 Admission for CAP last year She has recovered from that episode. Follow-up CT scan shows resolution of lung infiltrates with stable bronchiectasis, mild scarring in the right middle lobe.  #3 OSA Moderate OSA with desaturation. We will order AutoSet CPAP 5-15. Return to clinic in  3 months with download to assess compliance and response to therapy  # 4 Active smoker. Smoking cessation encouraged. She is cutting down and trying to quit on her own. I have asked her to start nicotine patches to help in this process. Time spent some counseling-5 minutes.  Plan/Recommendations: - Autoset CPAP 5-15 - Continue advair, albuterol - Smoking cessation  Marshell Garfinkel MD [facility] Pager [phone] 12/20/2016, 11:25 AM  CC: No ref. provider found

## 2016-12-20 NOTE — Patient Instructions (Addendum)
We'll get started on CPAP AutoSet 5-15 Return to clinic in 3 months with download We will refill her prescription for albuterol and Advair The CT scan and pulmonary function tests were discussed with you today.  Continue to work on smoking cessation with nicotine patches

## 2016-12-20 NOTE — Progress Notes (Signed)
PFT done today. 

## 2016-12-26 ENCOUNTER — Telehealth: Payer: Self-pay | Admitting: Pulmonary Disease

## 2016-12-26 NOTE — Telephone Encounter (Signed)
Spoke with Jeani Hawking at [facility]  She states that she called pt to set up CPAP  Pt declined due to cost  She has a high deductible and this would cost her too much  Will send to PM to make him aware

## 2017-01-14 ENCOUNTER — Emergency Department (HOSPITAL_COMMUNITY)
Admission: EM | Admit: 2017-01-14 | Discharge: 2017-01-14 | Disposition: A | Discharge status: Home / Self Care | Payer: BC Managed Care – PPO | Attending: Emergency Medicine | Admitting: Emergency Medicine

## 2017-01-14 ENCOUNTER — Emergency Department (HOSPITAL_COMMUNITY): Payer: BC Managed Care – PPO

## 2017-01-14 ENCOUNTER — Encounter (HOSPITAL_COMMUNITY): Payer: Self-pay | Admitting: Emergency Medicine

## 2017-01-14 DIAGNOSIS — F17210 Nicotine dependence, cigarettes, uncomplicated: Secondary | ICD-10-CM | POA: Diagnosis not present

## 2017-01-14 DIAGNOSIS — Z79899 Other long term (current) drug therapy: Secondary | ICD-10-CM | POA: Insufficient documentation

## 2017-01-14 DIAGNOSIS — I10 Essential (primary) hypertension: Secondary | ICD-10-CM | POA: Insufficient documentation

## 2017-01-14 DIAGNOSIS — R69 Illness, unspecified: Secondary | ICD-10-CM

## 2017-01-14 DIAGNOSIS — J441 Chronic obstructive pulmonary disease with (acute) exacerbation: Secondary | ICD-10-CM | POA: Diagnosis not present

## 2017-01-14 DIAGNOSIS — J111 Influenza due to unidentified influenza virus with other respiratory manifestations: Secondary | ICD-10-CM | POA: Insufficient documentation

## 2017-01-14 DIAGNOSIS — R0602 Shortness of breath: Secondary | ICD-10-CM | POA: Diagnosis present

## 2017-01-14 HISTORY — DX: Chronic obstructive pulmonary disease, unspecified: J44.9

## 2017-01-14 LAB — BASIC METABOLIC PANEL
ANION GAP: 9 (ref 5–15)
BUN: 13 mg/dL (ref 6–20)
CALCIUM: 9.6 mg/dL (ref 8.9–10.3)
CO2: 28 mmol/L (ref 22–32)
Chloride: 101 mmol/L (ref 101–111)
Creatinine, Ser: 0.54 mg/dL (ref 0.44–1.00)
GFR calc Af Amer: >60 mL/min (ref >60)
GFR calc non Af Amer: >60 mL/min (ref >60)
GLUCOSE: 100 mg/dL — AB (ref 65–99)
Potassium: 4.3 mmol/L (ref 3.5–5.1)
Sodium: 138 mmol/L (ref 135–145)

## 2017-01-14 LAB — CBC WITH DIFFERENTIAL/PLATELET
Basophils Absolute: 0 10*3/uL (ref 0.0–0.1)
Basophils Relative: 1 %
EOS ABS: 0.2 10*3/uL (ref 0.0–0.7)
Eosinophils Relative: 4 %
HEMATOCRIT: 50 % — AB (ref 36.0–46.0)
HEMOGLOBIN: 16.2 g/dL — AB (ref 12.0–15.0)
LYMPHS ABS: 1.5 10*3/uL (ref 0.7–4.0)
LYMPHS PCT: 27 %
MCH: 28.5 pg (ref 26.0–34.0)
MCHC: 32.4 g/dL (ref 30.0–36.0)
MCV: 87.9 fL (ref 78.0–100.0)
Monocytes Absolute: 0.5 10*3/uL (ref 0.1–1.0)
Monocytes Relative: 9 %
NEUTROS ABS: 3.3 10*3/uL (ref 1.7–7.7)
NEUTROS PCT: 61 %
Platelets: 178 10*3/uL (ref 150–400)
RBC: 5.69 MIL/uL — AB (ref 3.87–5.11)
RDW: 15.2 % (ref 11.5–15.5)
WBC: 5.5 10*3/uL (ref 4.0–10.5)

## 2017-01-14 LAB — I-STAT CG4 LACTIC ACID, ED: LACTIC ACID, VENOUS: 0.89 mmol/L (ref 0.5–1.9)

## 2017-01-14 LAB — BRAIN NATRIURETIC PEPTIDE: B Natriuretic Peptide: 38.5 pg/mL (ref 0.0–100.0)

## 2017-01-14 MED ORDER — ALBUTEROL SULFATE (2.5 MG/3ML) 0.083% IN NEBU: 2.5000 mg | INHALATION_SOLUTION | RESPIRATORY_TRACT | 0 refills | Status: DC | PRN

## 2017-01-14 MED ORDER — ACETAMINOPHEN 500 MG PO TABS
1000.0000 mg | ORAL_TABLET | Freq: Once | ORAL | Status: AC
  Administered 2017-01-14: 1000 mg via ORAL
  Filled 2017-01-14: qty 2

## 2017-01-14 MED ORDER — HYDROCHLOROTHIAZIDE 12.5 MG PO CAPS
12.5000 mg | ORAL_CAPSULE | Freq: Once | ORAL | Status: AC
  Administered 2017-01-14: 12.5 mg via ORAL
  Filled 2017-01-14: qty 1

## 2017-01-14 MED ORDER — OSELTAMIVIR PHOSPHATE 75 MG PO CAPS: 75.0000 mg | ORAL_CAPSULE | Freq: Two times a day (BID) | ORAL | 0 refills | Status: DC

## 2017-01-14 MED ORDER — IPRATROPIUM BROMIDE 0.02 % IN SOLN: 0.5000 mg | Freq: Four times a day (QID) | RESPIRATORY_TRACT | 12 refills | Status: DC

## 2017-01-14 MED ORDER — PREDNISONE 20 MG PO TABS: ORAL_TABLET | ORAL | 0 refills | Status: DC

## 2017-01-14 MED ORDER — METHYLPREDNISOLONE SODIUM SUCC 125 MG IJ SOLR
125.0000 mg | Freq: Once | INTRAMUSCULAR | Status: AC
  Administered 2017-01-14: 125 mg via INTRAVENOUS
  Filled 2017-01-14: qty 2

## 2017-01-14 MED ORDER — VERAPAMIL HCL ER 240 MG PO TBCR
240.0000 mg | EXTENDED_RELEASE_TABLET | Freq: Once | ORAL | Status: AC
  Administered 2017-01-14: 240 mg via ORAL
  Filled 2017-01-14: qty 1

## 2017-01-14 MED ORDER — IPRATROPIUM-ALBUTEROL 0.5-2.5 (3) MG/3ML IN SOLN
3.0000 mL | Freq: Once | RESPIRATORY_TRACT | Status: AC
  Administered 2017-01-14: 3 mL via RESPIRATORY_TRACT
  Filled 2017-01-14: qty 3

## 2017-01-14 MED ORDER — HYDROCODONE-HOMATROPINE 5-1.5 MG/5ML PO SYRP: 5.0000 mL | ORAL_SOLUTION | Freq: Four times a day (QID) | ORAL | 0 refills | Status: DC | PRN

## 2017-01-14 MED ORDER — HYDROCODONE-HOMATROPINE 5-1.5 MG/5ML PO SYRP
10.0000 mL | ORAL_SOLUTION | Freq: Once | ORAL | Status: AC
  Administered 2017-01-14: 10 mL via ORAL
  Filled 2017-01-14: qty 10

## 2017-01-14 NOTE — ED Notes (Signed)
Pt ambulated outside the room. Patient oxygen was 94 % on RA and HR of 105. Pt complain of  Dizziness while she was walking.

## 2017-01-14 NOTE — ED Triage Notes (Signed)
Patient c/o cough that is productive x 3 days, with chills and sweats, and headache-sinus congestion.

## 2017-01-14 NOTE — Discharge Instructions (Signed)
Take prescribed medications. Uses both albuterol and Atrovent in your nebulizer machine every 4 hours for the next 2 days then you can resume your usual schedule.

## 2017-01-14 NOTE — ED Provider Notes (Signed)
[facility] DEPT Provider Note   CSN: [id number] Arrival date & time: 01/14/17  0845     History   Chief Complaint Chief Complaint  Patient presents with  . Cough  . Chills    HPI Sharon Swanson is a 56 y.o. female.  HPI Patient states she's had 3 days of severe cough. She reports she feels short of breath and she has aching on both sides of her ribs. She reports fever of 101. Patient has a history of COPD. She did try nebulizer treatment at home but no significant relief. She reports cough has been productive of small amounts of yellow sputum. She also reports swelling of her legs. She reports this is somewhat chronic but seems worse. She endorses generalized myalgia and headache. He has had some diarrhea but no vomiting. Patient has not taken morning medications. Past Medical History:  Diagnosis Date  . Acid reflux   . Asthma   . COPD (chronic obstructive pulmonary disease) ([place])   . Fatty liver 11/08/2011  . Fibroids 11/08/2011  . Hypertension   . Obesity (BMI 30-39.9) 11/08/2011  . Pancreatitis 11/08/2011  . Ventral hernia     Patient Active Problem List   Diagnosis Date Noted  . COPD exacerbation ([place])   . CAP (community acquired pneumonia) 08/17/2014  . Acute respiratory failure ([place]) 08/17/2014  . Umbilical hernia 34/19/6222  . Pulmonary nodule, left 08/17/2014  . Constipation, chronic 08/15/2012  . Pancreatitis 11/08/2011  . GERD (gastroesophageal reflux disease) 11/08/2011  . Tobacco abuse 11/08/2011  . Fatty liver 11/08/2011  . Obesity (BMI 30-39.9) 11/08/2011  . Fibroids 11/08/2011    Past Surgical History:  Procedure Laterality Date  . ORTHOPEDIC SURGERY     R ankle, tendonitis    OB History    Gravida Para Term Preterm AB Living   0 0 0 0 0 0   SAB TAB Ectopic Multiple Live Births   0 0 0 0         Home Medications    Prior to Admission medications   Medication Sig Start Date End Date Taking? Authorizing Provider  albuterol (PROVENTIL  HFA;VENTOLIN HFA) 108 (90 Base) MCG/ACT inhaler Inhale 2 puffs into the lungs every 4 (four) hours as needed for wheezing or shortness of breath. 12/20/16  Yes Praveen Mannam, MD  albuterol (PROVENTIL) (2.5 MG/3ML) 0.083% nebulizer solution Take 3 mLs (2.5 mg total) by nebulization every 2 (two) hours as needed for wheezing or shortness of breath. 08/22/14  Yes Hosie Poisson, MD  diazepam (VALIUM) 2 MG tablet Take 1 tablet (2 mg total) by mouth every 12 (twelve) hours as needed (dizziness). 08/05/16  Yes Leo Grosser, MD  fluticasone (FLONASE) 50 MCG/ACT nasal spray Place 1 spray into both nostrils daily. 06/11/16  Yes Mir Marry Guan, MD  Fluticasone-Salmeterol (ADVAIR DISKUS) 250-50 MCG/DOSE AEPB Inhale 1 puff into the lungs 2 (two) times daily. 12/20/16  Yes Praveen Mannam, MD  hydrochlorothiazide (MICROZIDE) 12.5 MG capsule Take 1 capsule (12.5 mg total) by mouth daily. 06/10/16  Yes Mir Marry Guan, MD  insulin aspart (NOVOLOG) 100 UNIT/ML injection Inject 5 Units into the skin 3 (three) times daily with meals. 06/10/16  Yes Mir Marry Guan, MD  insulin glargine (LANTUS) 100 UNIT/ML injection Inject 0.12 mLs (12 Units total) into the skin daily. 06/11/16  Yes Mir Marry Guan, MD  ipratropium-albuterol (DUONEB) 0.5-2.5 (3) MG/3ML SOLN Take 3 mLs by nebulization every 6 (six) hours. 08/22/14  Yes Hosie Poisson, MD  meclizine (ANTIVERT) 25 MG  tablet Take 1 tablet (25 mg total) by mouth 3 (three) times daily as needed for dizziness. 08/05/16  Yes Leo Grosser, MD  metFORMIN (GLUCOPHAGE) 500 MG tablet Take 500 mg by mouth 2 (two) times daily with a meal. 04/27/16  Yes Historical Provider, MD  naproxen sodium (ANAPROX) 220 MG tablet Take 440 mg by mouth 2 (two) times daily with a meal.   Yes Historical Provider, MD  pravastatin (PRAVACHOL) 40 MG tablet Take 40 mg by mouth daily. 04/27/16 04/27/17 Yes Historical Provider, MD  ranitidine (ZANTAC) 150 MG tablet Take 150 mg by mouth 2 (two)  times daily. 04/25/16 04/25/17 Yes Historical Provider, MD  verapamil (CALAN-SR) 240 MG CR tablet Take 240 mg by mouth daily. 06/02/16  Yes Historical Provider, MD  albuterol (PROVENTIL) (2.5 MG/3ML) 0.083% nebulizer solution Take 3 mLs (2.5 mg total) by nebulization every 4 (four) hours as needed for wheezing or shortness of breath. 01/14/17   Charlesetta Shanks, MD  glucose monitoring kit (FREESTYLE) monitoring kit 1 each by Does not apply route as needed for other. 06/10/16   Mir Marry Guan, MD  HYDROcodone-homatropine [facility]) 5-1.5 MG/5ML syrup Take 5 mLs by mouth every 6 (six) hours as needed for cough. 01/14/17   Charlesetta Shanks, MD  ipratropium (ATROVENT) 0.02 % nebulizer solution Take 2.5 mLs (0.5 mg total) by nebulization 4 (four) times daily. 01/14/17   Charlesetta Shanks, MD  oseltamivir (TAMIFLU) 75 MG capsule Take 1 capsule (75 mg total) by mouth every 12 (twelve) hours. 01/14/17   Charlesetta Shanks, MD  predniSONE (DELTASONE) 20 MG tablet 3 tabs po day one, then 2 po daily x 4 days 01/14/17   Charlesetta Shanks, MD    Family History Family History  Problem Relation Age of Onset  . Hypertension Mother   . Diabetes Other     Social History Social History  Substance Use Topics  . Smoking status: Current Every Day Smoker    Packs/day: 0.50    Types: Cigarettes  . Smokeless tobacco: Never Used  . Alcohol use No     Allergies   Aspirin   Review of Systems Review of Systems 10 Systems reviewed and are negative for acute change except as noted in the HPI.   Physical Exam Updated Vital Signs BP 138/84   Pulse 89   Temp 98.2 F (36.8 C) (Oral)   Resp 16   Ht 5' 7"  (1.702 m)   Wt (!) 301 lb (136.5 kg)   SpO2 93%   BMI 47.14 kg/m   Physical Exam  Constitutional: She is oriented to person, place, and time. No distress.  Patient is alert and nontoxic. She has harsh bronchospastic cough. Mild increased work of breathing. Morbid obesity.  HENT:  Head: Normocephalic and atraumatic.    Mouth/Throat: Oropharynx is clear and moist.  Eyes: Conjunctivae and EOM are normal.  Cardiovascular: Normal rate, regular rhythm, normal heart sounds and intact distal pulses.   Pulmonary/Chest:  Bronchospastic cough. Mild increased work of breathing. Diffuse expiratory wheezes midlung fields. Diminished breath sounds at the bases.  Abdominal: Soft. She exhibits no distension. There is no tenderness. There is no guarding.  Musculoskeletal: Normal range of motion. She exhibits edema.  2+ edema B/L LE  Neurological: She is alert and oriented to person, place, and time. No cranial nerve deficit. She exhibits normal muscle tone. Coordination normal.  Skin: Skin is warm and dry.  Psychiatric: She has a normal mood and affect.     ED Treatments / Results  Labs (all labs ordered are listed, but only abnormal results are displayed) Labs Reviewed  BASIC METABOLIC PANEL - Abnormal; Notable for the following:       Result Value   Glucose, Bld 100 (*)    All other components within normal limits  CBC WITH DIFFERENTIAL/PLATELET - Abnormal; Notable for the following:    RBC 5.69 (*)    Hemoglobin 16.2 (*)    HCT 50.0 (*)    All other components within normal limits  CULTURE, BLOOD (ROUTINE X 2)  CULTURE, BLOOD (ROUTINE X 2)  BRAIN NATRIURETIC PEPTIDE  I-STAT CG4 LACTIC ACID, ED    EKG  EKG Interpretation None       Radiology Dg Chest 2 View  Result Date: 01/14/2017 CLINICAL DATA:  Productive cough for 3 days with chills and sweats. Headache. EXAM: CHEST  2 VIEW COMPARISON:  Chest radiographs 06/08/2016 and CT 06/29/2016 FINDINGS: The cardiac silhouette is upper limits of normal in size. The curvilinear opacity in the right lung base on the prior radiographs has resolved. Curvilinear opacity in the left lower lobe, best seen on the lateral radiograph, is unchanged from the prior CT and may reflect chronic atelectasis or scarring. There is chronic interstitial coarsening. No definite  acute airspace consolidation, overt pulmonary edema, pleural effusion, or pneumothorax is identified. Thoracic spondylosis is noted. IMPRESSION: No evidence of active cardiopulmonary disease. Left lower lobe atelectasis or scarring. Electronically Signed   By: Logan Bores M.D.   On: 01/14/2017 09:07    Procedures Procedures (including critical care time)  Medications Ordered in ED Medications  ipratropium-albuterol (DUONEB) 0.5-2.5 (3) MG/3ML nebulizer solution 3 mL (3 mLs Nebulization Given 01/14/17 1109)  HYDROcodone-homatropine (HYCODAN) 5-1.5 MG/5ML syrup 10 mL (10 mLs Oral Given 01/14/17 1109)  methylPREDNISolone sodium succinate (SOLU-MEDROL) 125 mg/2 mL injection 125 mg (125 mg Intravenous Given 01/14/17 1109)  hydrochlorothiazide (MICROZIDE) capsule 12.5 mg (12.5 mg Oral Given 01/14/17 1108)  verapamil (CALAN-SR) CR tablet 240 mg (240 mg Oral Given 01/14/17 1108)  ipratropium-albuterol (DUONEB) 0.5-2.5 (3) MG/3ML nebulizer solution 3 mL (3 mLs Nebulization Given 01/14/17 1231)  acetaminophen (TYLENOL) tablet 1,000 mg (1,000 mg Oral Given 01/14/17 1231)     Initial Impression / Assessment and Plan / ED Course  I have reviewed the triage vital signs and the nursing notes.  Pertinent labs & imaging results that were available during my care of the patient were reviewed by me and considered in my medical decision making (see chart for details).     Patient ambulated on pulse ox with oxygen saturation maintaining mid 90s.  Final Clinical Impressions(s) / ED Diagnoses   Final diagnoses:  COPD exacerbation ([place])  Influenza-like illness   Patient's history is very suggestive of influenza-like illness. She does however also have COPD and baseline. Patient has been treated for both COPD and influenza symptoms. This x-ray does not show focal pneumonia and patient does not have leukocytosis. At this time, she'll be treated with Tamiflu, prednisone, hydrocodone and acetaminophen for fever and  body ache. Patient has been counseled on signs and symptoms for short return. She is aware she should have any worsening shortness of breath or other concerning symptoms she will need reevaluation. New Prescriptions New Prescriptions   ALBUTEROL (PROVENTIL) (2.5 MG/3ML) 0.083% NEBULIZER SOLUTION    Take 3 mLs (2.5 mg total) by nebulization every 4 (four) hours as needed for wheezing or shortness of breath.   HYDROCODONE-HOMATROPINE (HYCODAN) 5-1.5 MG/5ML SYRUP    Take 5 mLs by mouth  every 6 (six) hours as needed for cough.   IPRATROPIUM (ATROVENT) 0.02 % NEBULIZER SOLUTION    Take 2.5 mLs (0.5 mg total) by nebulization 4 (four) times daily.   OSELTAMIVIR (TAMIFLU) 75 MG CAPSULE    Take 1 capsule (75 mg total) by mouth every 12 (twelve) hours.   PREDNISONE (DELTASONE) 20 MG TABLET    3 tabs po day one, then 2 po daily x 4 days     Charlesetta Shanks, MD 01/14/17 1731

## 2017-01-19 ENCOUNTER — Emergency Department (HOSPITAL_COMMUNITY): Payer: BC Managed Care – PPO

## 2017-01-19 ENCOUNTER — Observation Stay (HOSPITAL_COMMUNITY): Payer: BC Managed Care – PPO

## 2017-01-19 ENCOUNTER — Observation Stay (HOSPITAL_COMMUNITY)
Admission: EM | Admit: 2017-01-19 | Discharge: 2017-01-20 | Disposition: A | Discharge status: Home / Self Care | Payer: BC Managed Care – PPO | Attending: Family Medicine | Admitting: Family Medicine

## 2017-01-19 ENCOUNTER — Encounter (HOSPITAL_COMMUNITY): Payer: Self-pay | Admitting: Emergency Medicine

## 2017-01-19 DIAGNOSIS — Z79899 Other long term (current) drug therapy: Secondary | ICD-10-CM | POA: Diagnosis not present

## 2017-01-19 DIAGNOSIS — F419 Anxiety disorder, unspecified: Secondary | ICD-10-CM | POA: Diagnosis present

## 2017-01-19 DIAGNOSIS — E119 Type 2 diabetes mellitus without complications: Secondary | ICD-10-CM

## 2017-01-19 DIAGNOSIS — R0602 Shortness of breath: Secondary | ICD-10-CM | POA: Insufficient documentation

## 2017-01-19 DIAGNOSIS — G4733 Obstructive sleep apnea (adult) (pediatric): Secondary | ICD-10-CM | POA: Diagnosis present

## 2017-01-19 DIAGNOSIS — Z794 Long term (current) use of insulin: Secondary | ICD-10-CM | POA: Insufficient documentation

## 2017-01-19 DIAGNOSIS — R0789 Other chest pain: Secondary | ICD-10-CM | POA: Diagnosis not present

## 2017-01-19 DIAGNOSIS — R079 Chest pain, unspecified: Secondary | ICD-10-CM | POA: Diagnosis not present

## 2017-01-19 DIAGNOSIS — J449 Chronic obstructive pulmonary disease, unspecified: Secondary | ICD-10-CM | POA: Insufficient documentation

## 2017-01-19 DIAGNOSIS — Z6841 Body Mass Index (BMI) 40.0 and over, adult: Secondary | ICD-10-CM

## 2017-01-19 DIAGNOSIS — F17210 Nicotine dependence, cigarettes, uncomplicated: Secondary | ICD-10-CM | POA: Insufficient documentation

## 2017-01-19 DIAGNOSIS — I10 Essential (primary) hypertension: Secondary | ICD-10-CM | POA: Insufficient documentation

## 2017-01-19 DIAGNOSIS — J9601 Acute respiratory failure with hypoxia: Secondary | ICD-10-CM | POA: Diagnosis not present

## 2017-01-19 DIAGNOSIS — Z720 Tobacco use: Secondary | ICD-10-CM | POA: Diagnosis present

## 2017-01-19 LAB — CULTURE, BLOOD (ROUTINE X 2)
Culture: NO GROWTH
Culture: NO GROWTH
Special Requests: ADEQUATE
Special Requests: ADEQUATE

## 2017-01-19 LAB — CBC
HCT: 48.3 % — ABNORMAL HIGH (ref 36.0–46.0)
Hemoglobin: 15.5 g/dL — ABNORMAL HIGH (ref 12.0–15.0)
MCH: 28.1 pg (ref 26.0–34.0)
MCHC: 32.1 g/dL (ref 30.0–36.0)
MCV: 87.7 fL (ref 78.0–100.0)
Platelets: 205 10*3/uL (ref 150–400)
RBC: 5.51 MIL/uL — ABNORMAL HIGH (ref 3.87–5.11)
RDW: 15 % (ref 11.5–15.5)
WBC: 10 10*3/uL (ref 4.0–10.5)

## 2017-01-19 LAB — BASIC METABOLIC PANEL
Anion gap: 11 (ref 5–15)
BUN: 15 mg/dL (ref 6–20)
CO2: 24 mmol/L (ref 22–32)
Calcium: 9.4 mg/dL (ref 8.9–10.3)
Chloride: 101 mmol/L (ref 101–111)
Creatinine, Ser: 0.82 mg/dL (ref 0.44–1.00)
GFR calc Af Amer: >60 mL/min (ref >60)
GFR calc non Af Amer: >60 mL/min (ref >60)
Glucose, Bld: 227 mg/dL — ABNORMAL HIGH (ref 65–99)
Potassium: 4.6 mmol/L (ref 3.5–5.1)
Sodium: 136 mmol/L (ref 135–145)

## 2017-01-19 LAB — HEPATIC FUNCTION PANEL
ALT: 30 U/L (ref 14–54)
AST: 24 U/L (ref 15–41)
Albumin: 3.6 g/dL (ref 3.5–5.0)
Alkaline Phosphatase: 62 U/L (ref 38–126)
Bilirubin, Direct: 0.2 mg/dL (ref 0.1–0.5)
Indirect Bilirubin: 0 mg/dL — ABNORMAL LOW (ref 0.3–0.9)
Total Bilirubin: 0.2 mg/dL — ABNORMAL LOW (ref 0.3–1.2)
Total Protein: 6.6 g/dL (ref 6.5–8.1)

## 2017-01-19 LAB — MRSA PCR SCREENING: MRSA BY PCR: NEGATIVE

## 2017-01-19 LAB — TROPONIN I: Troponin I: <0.03 ng/mL (ref <0.03)

## 2017-01-19 LAB — GLUCOSE, CAPILLARY: GLUCOSE-CAPILLARY: 178 mg/dL — AB (ref 65–99)

## 2017-01-19 LAB — I-STAT TROPONIN, ED: Troponin i, poc: 0 ng/mL (ref 0.00–0.08)

## 2017-01-19 LAB — LIPASE, BLOOD: Lipase: 24 U/L (ref 11–51)

## 2017-01-19 LAB — BRAIN NATRIURETIC PEPTIDE: B Natriuretic Peptide: 56.3 pg/mL (ref 0.0–100.0)

## 2017-01-19 MED ORDER — FAMOTIDINE 20 MG PO TABS
20.0000 mg | ORAL_TABLET | Freq: Two times a day (BID) | ORAL | Status: DC
  Administered 2017-01-19 – 2017-01-20 (×2): 20 mg via ORAL
  Filled 2017-01-19 (×2): qty 1

## 2017-01-19 MED ORDER — HYDROMORPHONE HCL 1 MG/ML IJ SOLN
1.0000 mg | Freq: Once | INTRAMUSCULAR | Status: AC
  Administered 2017-01-19: 1 mg via INTRAVENOUS
  Filled 2017-01-19: qty 1

## 2017-01-19 MED ORDER — INSULIN ASPART 100 UNIT/ML ~~LOC~~ SOLN
0.0000 [IU] | Freq: Three times a day (TID) | SUBCUTANEOUS | Status: DC
  Administered 2017-01-20: 5 [IU] via SUBCUTANEOUS

## 2017-01-19 MED ORDER — DIAZEPAM 2 MG PO TABS: 2.0000 mg | ORAL_TABLET | Freq: Two times a day (BID) | ORAL | Status: DC | PRN

## 2017-01-19 MED ORDER — IPRATROPIUM-ALBUTEROL 0.5-2.5 (3) MG/3ML IN SOLN
3.0000 mL | Freq: Four times a day (QID) | RESPIRATORY_TRACT | Status: DC
  Administered 2017-01-19 – 2017-01-20 (×3): 3 mL via RESPIRATORY_TRACT
  Filled 2017-01-19 (×3): qty 3

## 2017-01-19 MED ORDER — ENOXAPARIN SODIUM 40 MG/0.4ML ~~LOC~~ SOLN
40.0000 mg | SUBCUTANEOUS | Status: DC
  Administered 2017-01-19: 40 mg via SUBCUTANEOUS
  Filled 2017-01-19 (×2): qty 0.4

## 2017-01-19 MED ORDER — HYDROCHLOROTHIAZIDE 12.5 MG PO CAPS
12.5000 mg | ORAL_CAPSULE | Freq: Every day | ORAL | Status: DC
  Administered 2017-01-20: 12.5 mg via ORAL
  Filled 2017-01-19: qty 1

## 2017-01-19 MED ORDER — GUAIFENESIN 100 MG/5ML PO SOLN
5.0000 mL | ORAL | Status: DC | PRN
  Filled 2017-01-19: qty 5

## 2017-01-19 MED ORDER — IOPAMIDOL (ISOVUE-370) INJECTION 76%
INTRAVENOUS | Status: AC
Start: 2017-01-19 — End: 2017-01-19
  Administered 2017-01-19: 100 mL
  Filled 2017-01-19: qty 100

## 2017-01-19 MED ORDER — HYDROCODONE-ACETAMINOPHEN 5-325 MG PO TABS
1.0000 | ORAL_TABLET | ORAL | Status: DC | PRN
  Administered 2017-01-19 – 2017-01-20 (×4): 2 via ORAL
  Filled 2017-01-19 (×4): qty 2

## 2017-01-19 MED ORDER — VERAPAMIL HCL ER 240 MG PO TBCR
240.0000 mg | EXTENDED_RELEASE_TABLET | Freq: Every day | ORAL | Status: DC
  Administered 2017-01-20: 240 mg via ORAL
  Filled 2017-01-19: qty 1

## 2017-01-19 MED ORDER — INSULIN ASPART 100 UNIT/ML ~~LOC~~ SOLN
3.0000 [IU] | Freq: Three times a day (TID) | SUBCUTANEOUS | Status: DC
  Administered 2017-01-20 (×2): 3 [IU] via SUBCUTANEOUS

## 2017-01-19 MED ORDER — IPRATROPIUM-ALBUTEROL 0.5-2.5 (3) MG/3ML IN SOLN: 3.0000 mL | RESPIRATORY_TRACT | Status: DC | PRN

## 2017-01-19 MED ORDER — AZITHROMYCIN 250 MG PO TABS
500.0000 mg | ORAL_TABLET | Freq: Every day | ORAL | Status: AC
  Administered 2017-01-19: 500 mg via ORAL
  Filled 2017-01-19: qty 2

## 2017-01-19 MED ORDER — OXYCODONE-ACETAMINOPHEN 5-325 MG PO TABS
1.0000 | ORAL_TABLET | Freq: Once | ORAL | Status: AC
  Administered 2017-01-19: 1 via ORAL
  Filled 2017-01-19: qty 1

## 2017-01-19 MED ORDER — ACETAMINOPHEN 325 MG PO TABS: 650.0000 mg | ORAL_TABLET | ORAL | Status: DC | PRN

## 2017-01-19 MED ORDER — PRAVASTATIN SODIUM 40 MG PO TABS
40.0000 mg | ORAL_TABLET | Freq: Every day | ORAL | Status: DC
  Administered 2017-01-19: 40 mg via ORAL
  Filled 2017-01-19: qty 1

## 2017-01-19 MED ORDER — ONDANSETRON HCL 4 MG/2ML IJ SOLN: 4.0000 mg | Freq: Four times a day (QID) | INTRAMUSCULAR | Status: DC | PRN

## 2017-01-19 MED ORDER — INSULIN ASPART 100 UNIT/ML ~~LOC~~ SOLN: 0.0000 [IU] | Freq: Every day | SUBCUTANEOUS | Status: DC

## 2017-01-19 MED ORDER — NITROGLYCERIN 0.4 MG SL SUBL: 0.4000 mg | SUBLINGUAL_TABLET | SUBLINGUAL | Status: DC | PRN

## 2017-01-19 MED ORDER — IPRATROPIUM-ALBUTEROL 0.5-2.5 (3) MG/3ML IN SOLN
3.0000 mL | RESPIRATORY_TRACT | Status: DC
  Administered 2017-01-19: 3 mL via RESPIRATORY_TRACT
  Filled 2017-01-19: qty 3

## 2017-01-19 MED ORDER — FLUTICASONE PROPIONATE 50 MCG/ACT NA SUSP
1.0000 | Freq: Every day | NASAL | Status: DC
  Administered 2017-01-20: 1 via NASAL
  Filled 2017-01-19: qty 16

## 2017-01-19 MED ORDER — MECLIZINE HCL 25 MG PO TABS: 25.0000 mg | ORAL_TABLET | Freq: Three times a day (TID) | ORAL | Status: DC | PRN

## 2017-01-19 MED ORDER — MOMETASONE FURO-FORMOTEROL FUM 200-5 MCG/ACT IN AERO
2.0000 | INHALATION_SPRAY | Freq: Two times a day (BID) | RESPIRATORY_TRACT | Status: DC
  Administered 2017-01-20: 2 via RESPIRATORY_TRACT
  Filled 2017-01-19: qty 8.8

## 2017-01-19 MED ORDER — INSULIN GLARGINE 100 UNIT/ML ~~LOC~~ SOLN
12.0000 [IU] | Freq: Every day | SUBCUTANEOUS | Status: DC
  Administered 2017-01-20: 12 [IU] via SUBCUTANEOUS
  Filled 2017-01-19: qty 0.12

## 2017-01-19 MED ORDER — HYDRALAZINE HCL 20 MG/ML IJ SOLN: 5.0000 mg | INTRAMUSCULAR | Status: DC | PRN

## 2017-01-19 MED ORDER — AZITHROMYCIN 250 MG PO TABS
250.0000 mg | ORAL_TABLET | Freq: Every day | ORAL | Status: DC
  Administered 2017-01-20: 250 mg via ORAL
  Filled 2017-01-19: qty 1

## 2017-01-19 NOTE — ED Notes (Signed)
Pt has gone for CT angio. Will take up when she gets back

## 2017-01-19 NOTE — ED Triage Notes (Signed)
Pt here from home with c/o  Center chest pain , non radiating , pt is not able to take asa . Pt recently getting over the flu

## 2017-01-19 NOTE — H&P (Addendum)
History and Physical    Sharon Swanson DGL:[id number] DOB: 16-Apr-1961 DOA: 01/19/2017  PCP: Eloise Levels, NP   Patient coming from: Home  Chief Complaint: Chest pain, SOB  HPI: Sharon Swanson is a 56 y.o. female with medical history significant for COPD, hypertension, insulin-dependent diabetes mellitus, OSA, and obesity with BMI 45 who presents to the emergency department for evaluation of chest pain and dyspnea. Patient reports that she developed upper respiratory symptoms with cough and also increased shortness of breath a little over a week ago, was evaluated by her PCP, diagnosed with influenza, and treated with a prednisone taper, nebs, and Tamiflu. She completed these medications over the past couple days, and while her upper respiratory symptoms have improved, her shortness of breath has continued to worsen and she has now developed chest pain. Chest pain is described as moderate to severe in intensity, dull and achy in character, localized to the left chest, constant since around noon, and with no alleviating or exacerbating factors identified. She was at rest, watching TV at time of symptom onset and has never experienced similar symptoms previously. She denies fevers or chills, denies recent long distance travel, denies any personal or family history of VTE. Patient notes that her cough is productive of thick clear, white, and yellow sputum. She also notes the development of orthopnea over the past couple weeks and has been sleeping upright. She endorses chronic lower extremity swelling which may have worsened recently. She is a current smoker and is largely immobile at baseline due to her body habitus, but denies recent surgery or travel. She does not take hormone therapy.  ED Course: Upon arrival to the ED, patient is found to be afebrile, requiring 3 L/m supplemental oxygen to maintain saturations greater than 90, tachypneic, mildly tachycardic, and with normal blood pressure. EKG  features a sinus tachycardia with rate 107 and left axis deviation. Chest x-ray is negative for acute cardiopulmonary disease. Chemistry panels notable for a glucose of 227. CBC features a chronic mild polycythemia with hemoglobin 15.5. Troponin is undetectable and BNP is within the normal limits at 56. Patient was treated with Percocet in the ED, and with IV Dilaudid. This improved her pain, but she continued to be dyspneic and continued to complain of some mild left sided chest discomfort. She will be admitted to the stepdown unit for further evaluation of this, and given her new hypoxia, tachycardia, chest pain, with obesity, general poor mobility, and cigarette smoking, she will be ruled out for PE with CTA chest.  Review of Systems:  All other systems reviewed and apart from HPI, are negative.  Past Medical History:  Diagnosis Date  . Acid reflux   . Asthma   . COPD (chronic obstructive pulmonary disease) ([place])   . Fatty liver 11/08/2011  . Fibroids 11/08/2011  . Hypertension   . Obesity (BMI 30-39.9) 11/08/2011  . Pancreatitis 11/08/2011  . Ventral hernia     Past Surgical History:  Procedure Laterality Date  . ORTHOPEDIC SURGERY     R ankle, tendonitis     reports that she has been smoking Cigarettes.  She has been smoking about 0.50 packs per day. She has never used smokeless tobacco. She reports that she does not drink alcohol or use drugs.  Allergies  Allergen Reactions  . Aspirin     Abdominal pain    Family History  Problem Relation Age of Onset  . Hypertension Mother   . Diabetes Other      Prior to  Admission medications   Medication Sig Start Date End Date Taking? Authorizing Provider  albuterol (PROVENTIL HFA;VENTOLIN HFA) 108 (90 Base) MCG/ACT inhaler Inhale 2 puffs into the lungs every 4 (four) hours as needed for wheezing or shortness of breath. 12/20/16  Yes Praveen Mannam, MD  albuterol (PROVENTIL) (2.5 MG/3ML) 0.083% nebulizer solution Take 3 mLs (2.5 mg  total) by nebulization every 2 (two) hours as needed for wheezing or shortness of breath. 08/22/14  Yes Hosie Poisson, MD  albuterol (PROVENTIL) (2.5 MG/3ML) 0.083% nebulizer solution Take 3 mLs (2.5 mg total) by nebulization every 4 (four) hours as needed for wheezing or shortness of breath. 01/14/17  Yes Charlesetta Shanks, MD  diazepam (VALIUM) 2 MG tablet Take 1 tablet (2 mg total) by mouth every 12 (twelve) hours as needed (dizziness). 08/05/16  Yes Leo Grosser, MD  fluticasone (FLONASE) 50 MCG/ACT nasal spray Place 1 spray into both nostrils daily. 06/11/16  Yes Mir Marry Guan, MD  Fluticasone-Salmeterol (ADVAIR DISKUS) 250-50 MCG/DOSE AEPB Inhale 1 puff into the lungs 2 (two) times daily. 12/20/16  Yes Praveen Mannam, MD  glucose monitoring kit (FREESTYLE) monitoring kit 1 each by Does not apply route as needed for other. 06/10/16  Yes Mir Marry Guan, MD  hydrochlorothiazide (MICROZIDE) 12.5 MG capsule Take 1 capsule (12.5 mg total) by mouth daily. 06/10/16  Yes Mir Marry Guan, MD  HYDROcodone-homatropine [facility]) 5-1.5 MG/5ML syrup Take 5 mLs by mouth every 6 (six) hours as needed for cough. 01/14/17  Yes Charlesetta Shanks, MD  insulin aspart (NOVOLOG) 100 UNIT/ML injection Inject 5 Units into the skin 3 (three) times daily with meals. 06/10/16  Yes Mir Marry Guan, MD  insulin glargine (LANTUS) 100 UNIT/ML injection Inject 0.12 mLs (12 Units total) into the skin daily. 06/11/16  Yes Mir Marry Guan, MD  ipratropium (ATROVENT) 0.02 % nebulizer solution Take 2.5 mLs (0.5 mg total) by nebulization 4 (four) times daily. 01/14/17  Yes Charlesetta Shanks, MD  ipratropium-albuterol (DUONEB) 0.5-2.5 (3) MG/3ML SOLN Take 3 mLs by nebulization every 6 (six) hours. 08/22/14  Yes Hosie Poisson, MD  meclizine (ANTIVERT) 25 MG tablet Take 1 tablet (25 mg total) by mouth 3 (three) times daily as needed for dizziness. 08/05/16  Yes Leo Grosser, MD  metFORMIN (GLUCOPHAGE) 500 MG tablet  Take 500 mg by mouth 2 (two) times daily with a meal. 04/27/16  Yes Historical Provider, MD  naproxen sodium (ANAPROX) 220 MG tablet Take 440 mg by mouth 2 (two) times daily with a meal.   Yes Historical Provider, MD  oseltamivir (TAMIFLU) 75 MG capsule Take 1 capsule (75 mg total) by mouth every 12 (twelve) hours. 01/14/17  Yes Charlesetta Shanks, MD  pravastatin (PRAVACHOL) 40 MG tablet Take 40 mg by mouth daily. 04/27/16 04/27/17 Yes Historical Provider, MD  predniSONE (DELTASONE) 20 MG tablet 3 tabs po day one, then 2 po daily x 4 days 01/14/17  Yes Charlesetta Shanks, MD  ranitidine (ZANTAC) 150 MG tablet Take 150 mg by mouth 2 (two) times daily. 04/25/16 04/25/17 Yes Historical Provider, MD  verapamil (CALAN-SR) 240 MG CR tablet Take 240 mg by mouth daily. 06/02/16  Yes Historical Provider, MD    Physical Exam: Vitals:   01/19/17 1845 01/19/17 1851 01/19/17 1900 01/19/17 1915  BP: (!) 156/80 (!) 156/80  140/84  Pulse: 85 82 82 79  Resp: _0 Temp:      TempSrc:      SpO2: 98% 98% 98% 95%      Constitutional:  Obese, tachypneic, dsypneic with speech, no pallor, no diaphoresis.  Eyes: PERTLA, lids and conjunctivae normal ENMT: Mucous membranes are moist. Posterior pharynx clear of any exudate or lesions.   Neck: normal, supple, no masses, no thyromegaly Respiratory: Breath sounds mildly diminished bilaterally, no wheezing, no crackles. Mild tachypnea at rest. No accessory muscle use.  Cardiovascular: Rate ~110 and regular. 1+ pretibial pitting edema bilaterally. JVP not well-visualized. Abdomen: No distension, no tenderness, no masses palpated. Bowel sounds normal.  Musculoskeletal: no clubbing / cyanosis. No joint deformity upper and lower extremities.    Skin: no significant rashes, lesions, ulcers. Warm, dry, well-perfused. Neurologic: CN 2-12 grossly intact. Sensation intact, DTR normal. Strength 5/5 in all 4 limbs.  Psychiatric: Alert and oriented x 3. Normal mood and affect.     Labs  on Admission: I have personally reviewed following labs and imaging studies  CBC:  Recent Labs Lab 01/14/17 1045 01/19/17 1528  WBC 5.5 10.0  NEUTROABS 3.3  --   HGB 16.2* 15.5*  HCT 50.0* 48.3*  MCV 87.9 87.7  PLT 178 299   Basic Metabolic Panel:  Recent Labs Lab 01/14/17 1045 01/19/17 1528  NA 138 136  K 4.3 4.6  CL 101 101  CO2 28 24  GLUCOSE 100* 227*  BUN 13 15  CREATININE 0.54 0.82  CALCIUM 9.6 9.4   GFR: Estimated Creatinine Clearance: 112.1 mL/min (by C-G formula based on SCr of 0.82 mg/dL). Liver Function Tests:  Recent Labs Lab 01/19/17 1528  AST 24  ALT 30  ALKPHOS 62  BILITOT 0.2*  PROT 6.6  ALBUMIN 3.6    Recent Labs Lab 01/19/17 1528  LIPASE 24   No results for input(s): AMMONIA in the last 168 hours. Coagulation Profile: No results for input(s): INR, PROTIME in the last 168 hours. Cardiac Enzymes: No results for input(s): CKTOTAL, CKMB, CKMBINDEX, TROPONINI in the last 168 hours. BNP (last 3 results) No results for input(s): PROBNP in the last 8760 hours. HbA1C: No results for input(s): HGBA1C in the last 72 hours. CBG: No results for input(s): GLUCAP in the last 168 hours. Lipid Profile: No results for input(s): CHOL, HDL, LDLCALC, TRIG, CHOLHDL, LDLDIRECT in the last 72 hours. Thyroid Function Tests: No results for input(s): TSH, T4TOTAL, FREET4, T3FREE, THYROIDAB in the last 72 hours. Anemia Panel: No results for input(s): VITAMINB12, FOLATE, FERRITIN, TIBC, IRON, RETICCTPCT in the last 72 hours. Urine analysis:    Component Value Date/Time   COLORURINE YELLOW 08/04/2016 1647   APPEARANCEUR CLOUDY (A) 08/04/2016 1647   LABSPEC 1.014 08/04/2016 1647   PHURINE 6.0 08/04/2016 1647   GLUCOSEU NEGATIVE 08/04/2016 1647   GLUCOSEU NEGATIVE 08/12/2012 1516   HGBUR TRACE (A) 08/04/2016 1647   BILIRUBINUR NEGATIVE 08/04/2016 1647   KETONESUR NEGATIVE 08/04/2016 1647   PROTEINUR 30 (A) 08/04/2016 1647   UROBILINOGEN 1.0 08/17/2014  1522   NITRITE NEGATIVE 08/04/2016 1647   LEUKOCYTESUR NEGATIVE 08/04/2016 1647   Sepsis Labs: _0 (procalcitonin:4,lacticidven:4) ) Recent Results (from the past 240 hour(s))  Culture, blood (routine x 2)     Status: None   Collection Time: 01/14/17 10:45 AM  Result Value Ref Range Status   Specimen Description BLOOD RIGHT ANTECUBITAL  Final   Special Requests   Final    BOTTLES DRAWN AEROBIC AND ANAEROBIC Blood Culture adequate volume   Culture   Final    NO GROWTH 5 DAYS Performed at [facility], [place] [address] [place], [place] [address]    Report Status 01/19/2017  FINAL  Final  Culture, blood (routine x 2)     Status: None   Collection Time: 01/14/17 11:10 AM  Result Value Ref Range Status   Specimen Description BLOOD LEFT WRIST  Final   Special Requests   Final    BOTTLES DRAWN AEROBIC AND ANAEROBIC Blood Culture adequate volume   Culture   Final    NO GROWTH 5 DAYS Performed at [facility], [address], [place], [place] [address]    Report Status 01/19/2017 FINAL  Final     Radiological Exams on Admission: Dg Chest 2 View  Result Date: 01/19/2017 CLINICAL DATA:  LEFT-sided chest pressure, short of breath, productive cough EXAM: CHEST  2 VIEW COMPARISON:  None. FINDINGS: Normal mediastinum and cardiac silhouette. Normal pulmonary vasculature. No evidence of effusion, infiltrate, or pneumothorax. No acute bony abnormality. IMPRESSION: No acute cardiopulmonary process. Electronically Signed   By: Suzy Bouchard M.D.   On: 01/19/2017 16:25    EKG: Independently reviewed. Sinus tachycardia (rate 107), LAD   Assessment/Plan  1. Chest pain  - Pt presents with constant left-sided chest pain that began at rest  - EKG is notable only for sinus tachycardia, CXR clear, troponin undetectable, pain not reproducible  - Given her new hypoxia and tachycardia along with the CP in this obese and very sedentary smoker, PE will be ruled-out with CTA  -  New-onset CHF is another potential etiology, though with normal BNP and CXR without edema or cardiomegally, this is less likely  - Pain not reproducible on exam, but possibly MSK given the recent influenza with labored breathing and coughing  - Plan to rule-out PE, monitor on telemetry for ischemic changes, obtain serial troponin measurements, repeat EKG  - She reports an allergy to aspirin, will avoid beta-blocker until PE excluded, continue statin    ADDENDUM:  CTA neg for PE, notable for ground-glass opacities, possibly reflective of atypical infection, possibly residual influenza vs secondary atypical bacterial infection. Will add azithromycin.   2. Acute hypoxic respiratory failure  - Pt with hx of COPD, recent influenza, now requiring 3 Lpm supplemental O2 to maintain adequate sats while at rest  - CXR is clear, CTA pending  - Continue Dulera and nebs as below    3. COPD  - No wheezing on admission, though new supplemental O2 requirement noted  - CXR clear, has just completed prednisone burst  - Continue ICS/LABA with Dulera, continue Duoneb q6h and q2h prn  - Supplemental O2 prn    4. OSA  - Continue CPAP qHS    5. Hypertension   - BP mildly elevated in ED, possibly d/t pain  - Continue verapamil and HCTZ    6. Insulin-dependent DM - A1c 6.9% in September 2017  - Managed at home with metformin, Lantus 12 units qD, and Novolog 5 units TID  - Check CBG with meals and qHS  - Continue Lantus 12 units qD with Novolog 3 units TID and moderate-intensity Novolog correctional    DVT prophylaxis: sq Lovenox Code Status: Full  Family Communication: Discussed with patient Disposition Plan: Observe in stepdown unit Consults called: None Admission status: Observation    Vianne Bulls, MD [facility] Pager [phone]  If 7PM-7AM, please contact night-coverage [email or web address] Password [facility]  01/19/2017, 7:51 PM

## 2017-01-19 NOTE — ED Provider Notes (Signed)
[place] DEPT Provider Note   CSN: [id number] Arrival date & time: 01/19/17  1449     History   Chief Complaint Chief Complaint  Patient presents with  . Chest Pain    HPI Sharon Swanson is a 55 y.o. female.  The history is provided by the patient.  Chest Pain   This is a new problem. The current episode started 1 to 2 hours ago. The problem occurs constantly. The problem has not changed since onset.The pain is associated with rest. The pain is present in the lateral region. The pain is at a severity of 8/10. The pain is moderate. The quality of the pain is described as pressure-like. Associated symptoms include back pain, cough, malaise/fatigue and shortness of breath. Pertinent negatives include no abdominal pain, no claudication, no diaphoresis, no dizziness, no exertional chest pressure, no fever, no headaches, no hemoptysis, no irregular heartbeat, no leg pain, no lower extremity edema, no nausea, no near-syncope, no numbness, no orthopnea, no palpitations, no PND, no sputum production, no syncope, no vomiting and no weakness. She has tried nothing for the symptoms. The treatment provided no relief. Risk factors include sedentary lifestyle, lack of exercise and obesity.  Her past medical history is significant for COPD, hyperlipidemia and hypertension.  Pertinent negatives for past medical history include no CAD and no seizures.    Past Medical History:  Diagnosis Date  . Acid reflux   . Asthma   . COPD (chronic obstructive pulmonary disease) ([place])   . Fatty liver 11/08/2011  . Fibroids 11/08/2011  . Hypertension   . Obesity (BMI 30-39.9) 11/08/2011  . Pancreatitis 11/08/2011  . Ventral hernia     Patient Active Problem List   Diagnosis Date Noted  . COPD exacerbation ([place])   . CAP (community acquired pneumonia) 08/17/2014  . Acute respiratory failure ([employer]) 08/17/2014  . Umbilical hernia 85/88/5027  . Pulmonary nodule, left 08/17/2014  . Constipation, chronic  08/15/2012  . Pancreatitis 11/08/2011  . GERD (gastroesophageal reflux disease) 11/08/2011  . Tobacco abuse 11/08/2011  . Fatty liver 11/08/2011  . Obesity (BMI 30-39.9) 11/08/2011  . Fibroids 11/08/2011    Past Surgical History:  Procedure Laterality Date  . ORTHOPEDIC SURGERY     R ankle, tendonitis    OB History    Gravida Para Term Preterm AB Living   0 0 0 0 0 0   SAB TAB Ectopic Multiple Live Births   0 0 0 0         Home Medications    Prior to Admission medications   Medication Sig Start Date End Date Taking? Authorizing Provider  albuterol (PROVENTIL HFA;VENTOLIN HFA) 108 (90 Base) MCG/ACT inhaler Inhale 2 puffs into the lungs every 4 (four) hours as needed for wheezing or shortness of breath. 12/20/16  Yes Praveen Mannam, MD  albuterol (PROVENTIL) (2.5 MG/3ML) 0.083% nebulizer solution Take 3 mLs (2.5 mg total) by nebulization every 2 (two) hours as needed for wheezing or shortness of breath. 08/22/14  Yes Hosie Poisson, MD  albuterol (PROVENTIL) (2.5 MG/3ML) 0.083% nebulizer solution Take 3 mLs (2.5 mg total) by nebulization every 4 (four) hours as needed for wheezing or shortness of breath. 01/14/17  Yes Charlesetta Shanks, MD  diazepam (VALIUM) 2 MG tablet Take 1 tablet (2 mg total) by mouth every 12 (twelve) hours as needed (dizziness). 08/05/16  Yes Leo Grosser, MD  fluticasone (FLONASE) 50 MCG/ACT nasal spray Place 1 spray into both nostrils daily. 06/11/16  Yes Mir Marry Guan, MD  Fluticasone-Salmeterol (ADVAIR DISKUS) 250-50 MCG/DOSE AEPB Inhale 1 puff into the lungs 2 (two) times daily. 12/20/16  Yes Praveen Mannam, MD  glucose monitoring kit (FREESTYLE) monitoring kit 1 each by Does not apply route as needed for other. 06/10/16  Yes Mir Marry Guan, MD  hydrochlorothiazide (MICROZIDE) 12.5 MG capsule Take 1 capsule (12.5 mg total) by mouth daily. 06/10/16  Yes Mir Marry Guan, MD  HYDROcodone-homatropine [facility]) 5-1.5 MG/5ML syrup Take 5 mLs by  mouth every 6 (six) hours as needed for cough. 01/14/17  Yes Charlesetta Shanks, MD  insulin aspart (NOVOLOG) 100 UNIT/ML injection Inject 5 Units into the skin 3 (three) times daily with meals. 06/10/16  Yes Mir Marry Guan, MD  insulin glargine (LANTUS) 100 UNIT/ML injection Inject 0.12 mLs (12 Units total) into the skin daily. 06/11/16  Yes Mir Marry Guan, MD  ipratropium (ATROVENT) 0.02 % nebulizer solution Take 2.5 mLs (0.5 mg total) by nebulization 4 (four) times daily. 01/14/17  Yes Charlesetta Shanks, MD  ipratropium-albuterol (DUONEB) 0.5-2.5 (3) MG/3ML SOLN Take 3 mLs by nebulization every 6 (six) hours. 08/22/14  Yes Hosie Poisson, MD  meclizine (ANTIVERT) 25 MG tablet Take 1 tablet (25 mg total) by mouth 3 (three) times daily as needed for dizziness. 08/05/16  Yes Leo Grosser, MD  metFORMIN (GLUCOPHAGE) 500 MG tablet Take 500 mg by mouth 2 (two) times daily with a meal. 04/27/16  Yes Historical Provider, MD  naproxen sodium (ANAPROX) 220 MG tablet Take 440 mg by mouth 2 (two) times daily with a meal.   Yes Historical Provider, MD  oseltamivir (TAMIFLU) 75 MG capsule Take 1 capsule (75 mg total) by mouth every 12 (twelve) hours. 01/14/17  Yes Charlesetta Shanks, MD  pravastatin (PRAVACHOL) 40 MG tablet Take 40 mg by mouth daily. 04/27/16 04/27/17 Yes Historical Provider, MD  predniSONE (DELTASONE) 20 MG tablet 3 tabs po day one, then 2 po daily x 4 days 01/14/17  Yes Charlesetta Shanks, MD  ranitidine (ZANTAC) 150 MG tablet Take 150 mg by mouth 2 (two) times daily. 04/25/16 04/25/17 Yes Historical Provider, MD  verapamil (CALAN-SR) 240 MG CR tablet Take 240 mg by mouth daily. 06/02/16  Yes Historical Provider, MD    Family History Family History  Problem Relation Age of Onset  . Hypertension Mother   . Diabetes Other     Social History Social History  Substance Use Topics  . Smoking status: Current Every Day Smoker    Packs/day: 0.50    Types: Cigarettes  . Smokeless tobacco: Never Used  .  Alcohol use No     Allergies   Aspirin   Review of Systems Review of Systems  Constitutional: Positive for fatigue and malaise/fatigue. Negative for chills, diaphoresis and fever.  HENT: Negative for ear pain and sore throat.   Eyes: Negative for pain and visual disturbance.  Respiratory: Positive for cough, chest tightness and shortness of breath. Negative for hemoptysis and sputum production.   Cardiovascular: Positive for chest pain. Negative for palpitations, orthopnea, claudication, syncope, PND and near-syncope.  Gastrointestinal: Negative for abdominal pain, nausea and vomiting.  Genitourinary: Negative for dysuria and hematuria.  Musculoskeletal: Positive for back pain. Negative for arthralgias.  Skin: Negative for color change and rash.  Neurological: Negative for dizziness, seizures, syncope, weakness, numbness and headaches.  All other systems reviewed and are negative.    Physical Exam Updated Vital Signs BP (!) 142/80   Pulse 97   Temp 98.9 F (37.2 C) (Oral)   Resp (!) 23  SpO2 95%   Physical Exam  Constitutional: She appears well-developed and well-nourished. No distress.  HENT:  Head: Normocephalic and atraumatic.  Eyes: Conjunctivae and EOM are normal.  Neck: Neck supple.  Cardiovascular: Normal rate and regular rhythm.   No murmur heard. Pulmonary/Chest: Effort normal. No respiratory distress. She has wheezes in the right middle field, the right lower field, the left middle field and the left lower field.  Abdominal: Soft. There is no tenderness.  Musculoskeletal: She exhibits no edema.  Neurological: She is alert. No cranial nerve deficit or sensory deficit. GCS eye subscore is 4. GCS verbal subscore is 5. GCS motor subscore is 6.  Skin: Skin is warm and dry.  Psychiatric: She has a normal mood and affect.  Nursing note and vitals reviewed.    ED Treatments / Results  Labs (all labs ordered are listed, but only abnormal results are  displayed) Labs Reviewed  BASIC METABOLIC PANEL - Abnormal; Notable for the following:       Result Value   Glucose, Bld 227 (*)    All other components within normal limits  CBC - Abnormal; Notable for the following:    RBC 5.51 (*)    Hemoglobin 15.5 (*)    HCT 48.3 (*)    All other components within normal limits  HEPATIC FUNCTION PANEL - Abnormal; Notable for the following:    Total Bilirubin 0.2 (*)    Indirect Bilirubin 0.0 (*)    All other components within normal limits  BRAIN NATRIURETIC PEPTIDE  LIPASE, BLOOD  I-STAT TROPOININ, ED    EKG  EKG Interpretation  Date/Time:  Friday January 19 2017 15:01:35 EDT Ventricular Rate:  107 PR Interval:    QRS Duration: 83 QT Interval:  356 QTC Calculation: 475 R Axis:   -39 Text Interpretation:  Sinus tachycardia Biatrial enlargement Left axis deviation Baseline wander in lead(s) II III aVF Confirmed by Wilson Singer  MD, [place] [id number]) on 01/19/2017 3:17:30 PM       Radiology Dg Chest 2 View  Result Date: 01/19/2017 CLINICAL DATA:  LEFT-sided chest pressure, short of breath, productive cough EXAM: CHEST  2 VIEW COMPARISON:  None. FINDINGS: Normal mediastinum and cardiac silhouette. Normal pulmonary vasculature. No evidence of effusion, infiltrate, or pneumothorax. No acute bony abnormality. IMPRESSION: No acute cardiopulmonary process. Electronically Signed   By: Suzy Bouchard M.D.   On: 01/19/2017 16:25    Procedures Procedures (including critical care time)  Medications Ordered in ED Medications  ipratropium-albuterol (DUONEB) 0.5-2.5 (3) MG/3ML nebulizer solution 3 mL (3 mLs Nebulization Given 01/19/17 1645)  oxyCODONE-acetaminophen (PERCOCET/ROXICET) 5-325 MG per tablet 1 tablet (1 tablet Oral Given 01/19/17 1645)     Initial Impression / Assessment and Plan / ED Course  I have reviewed the triage vital signs and the nursing notes.  Pertinent labs & imaging results that were available during my care of the patient were  reviewed by me and considered in my medical decision making (see chart for details).     56 year old female with history of obesity, COPD and presents in the setting of chest pain. Patient reports she was at home when she started having left sided chest pressure which radiated to left arm and left neck. She additionally endorses mild radiation to left back. She reports due to continued symptoms she presented to the emergency department. Patient has anaphylaxis to aspirin.  On arrival patient was hemodynamically stable and afebrile. EKG without signs of acute ischemia. On examination patient had mild swelling in  legs bilaterally. Patient had no murmurs rubs or gallops. Different diagnosis includes pneumonia, CHF, ACS. Have low suspicion for PE as patient has no sputum lateral swelling legs or history of clotting. Patient without significant shortness of breath. She did report supple and oxygen slightly improved symptoms. Oximetry levels within normal limits on room air.  Initial troponin was negative and no signs of electrolyte abnormalities. Chest x-ray without acute cardiopulmonary abnormality. Slight haziness concerning for possible CHF. Patient is a high risk for heart score and at this time believe patient will require admission to hospital for further ACS rule out and management of this condition. BNP reveals no significant elevation.  Patient stable at time of transfer of care.  Final Clinical Impressions(s) / ED Diagnoses   Final diagnoses:  Nonspecific chest pain  SOB (shortness of breath)    New Prescriptions New Prescriptions   No medications on file     Esaw Grandchild, MD 01/19/17 Jakes Corner, MD 01/24/17 [phone]

## 2017-01-20 DIAGNOSIS — J449 Chronic obstructive pulmonary disease, unspecified: Secondary | ICD-10-CM

## 2017-01-20 DIAGNOSIS — R0602 Shortness of breath: Secondary | ICD-10-CM | POA: Diagnosis not present

## 2017-01-20 DIAGNOSIS — I10 Essential (primary) hypertension: Secondary | ICD-10-CM

## 2017-01-20 DIAGNOSIS — R079 Chest pain, unspecified: Secondary | ICD-10-CM

## 2017-01-20 DIAGNOSIS — E119 Type 2 diabetes mellitus without complications: Secondary | ICD-10-CM | POA: Diagnosis not present

## 2017-01-20 DIAGNOSIS — Z794 Long term (current) use of insulin: Secondary | ICD-10-CM

## 2017-01-20 DIAGNOSIS — Z720 Tobacco use: Secondary | ICD-10-CM

## 2017-01-20 DIAGNOSIS — G4733 Obstructive sleep apnea (adult) (pediatric): Secondary | ICD-10-CM

## 2017-01-20 DIAGNOSIS — R0789 Other chest pain: Secondary | ICD-10-CM | POA: Diagnosis not present

## 2017-01-20 LAB — GLUCOSE, CAPILLARY
GLUCOSE-CAPILLARY: 103 mg/dL — AB (ref 65–99)
GLUCOSE-CAPILLARY: 219 mg/dL — AB (ref 65–99)

## 2017-01-20 LAB — CBC
HEMATOCRIT: 47.9 % — AB (ref 36.0–46.0)
HEMOGLOBIN: 14.8 g/dL (ref 12.0–15.0)
MCH: 27.7 pg (ref 26.0–34.0)
MCHC: 30.9 g/dL (ref 30.0–36.0)
MCV: 89.7 fL (ref 78.0–100.0)
Platelets: 193 10*3/uL (ref 150–400)
RBC: 5.34 MIL/uL — ABNORMAL HIGH (ref 3.87–5.11)
RDW: 15.6 % — AB (ref 11.5–15.5)
WBC: 6.7 10*3/uL (ref 4.0–10.5)

## 2017-01-20 LAB — BASIC METABOLIC PANEL
ANION GAP: 8 (ref 5–15)
BUN: 13 mg/dL (ref 6–20)
CO2: 31 mmol/L (ref 22–32)
Calcium: 9 mg/dL (ref 8.9–10.3)
Chloride: 99 mmol/L — ABNORMAL LOW (ref 101–111)
Creatinine, Ser: 0.68 mg/dL (ref 0.44–1.00)
GFR calc Af Amer: >60 mL/min (ref >60)
GLUCOSE: 107 mg/dL — AB (ref 65–99)
POTASSIUM: 4.2 mmol/L (ref 3.5–5.1)
Sodium: 138 mmol/L (ref 135–145)

## 2017-01-20 LAB — TROPONIN I

## 2017-01-20 LAB — HEMOGLOBIN A1C
Hgb A1c MFr Bld: 7 % — ABNORMAL HIGH (ref 4.8–5.6)
Mean Plasma Glucose: 154 mg/dL

## 2017-01-20 MED ORDER — DOXYCYCLINE HYCLATE 100 MG PO TABS
100.0000 mg | ORAL_TABLET | Freq: Two times a day (BID) | ORAL | Status: DC
  Administered 2017-01-20: 100 mg via ORAL
  Filled 2017-01-20: qty 1

## 2017-01-20 MED ORDER — GUAIFENESIN 100 MG/5ML PO SOLN: 15.0000 mL | Freq: Four times a day (QID) | ORAL | 0 refills | Status: DC | PRN

## 2017-01-20 MED ORDER — DOXYCYCLINE HYCLATE 100 MG PO TABS: 100.0000 mg | ORAL_TABLET | Freq: Two times a day (BID) | ORAL | 0 refills | Status: AC

## 2017-01-20 NOTE — Discharge Instructions (Signed)
Seek medical care or return if symptoms recur, worsen or new problem develops.    Heart-Healthy Eating Plan Heart-healthy meal planning includes:  Limiting unhealthy fats.  Increasing healthy fats.  Making other small dietary changes. You may need to talk with your doctor or a diet specialist (dietitian) to create an eating plan that is right for you. What types of fat should I choose?  Choose healthy fats. These include olive oil and canola oil, flaxseeds, walnuts, almonds, and seeds.  Eat more omega-3 fats. These include salmon, mackerel, sardines, tuna, flaxseed oil, and ground flaxseeds. Try to eat fish at least twice each week.  Limit saturated fats.  Saturated fats are often found in animal products, such as meats, butter, and cream.  Plant sources of saturated fats include palm oil, palm kernel oil, and coconut oil.  Avoid foods with partially hydrogenated oils in them. These include stick margarine, some tub margarines, cookies, crackers, and other baked goods. These contain trans fats. What general guidelines do I need to follow?  Check food labels carefully. Identify foods with trans fats or high amounts of saturated fat.  Fill one half of your plate with vegetables and green salads. Eat 4-5 servings of vegetables per day. A serving of vegetables is:  1 cup of raw leafy vegetables.   cup of raw or cooked cut-up vegetables.   cup of vegetable juice.  Fill one fourth of your plate with whole grains. Look for the word "whole" as the first word in the ingredient list.  Fill one fourth of your plate with lean protein foods.  Eat 4-5 servings of fruit per day. A serving of fruit is:  One medium whole fruit.   cup of dried fruit.   cup of fresh, frozen, or canned fruit.   cup of 100% fruit juice.  Eat more foods that contain soluble fiber. These include apples, broccoli, carrots, beans, peas, and barley. Try to get 20-30 g of fiber per day.  Eat more  home-cooked food. Eat less restaurant, buffet, and fast food.  Limit or avoid alcohol.  Limit foods high in starch and sugar.  Avoid fried foods.  Avoid frying your food. Try baking, boiling, grilling, or broiling it instead. You can also reduce fat by:  Removing the skin from poultry.  Removing all visible fats from meats.  Skimming the fat off of stews, soups, and gravies before serving them.  Steaming vegetables in water or broth.  Lose weight if you are overweight.  Eat 4-5 servings of nuts, legumes, and seeds per week:  One serving of dried beans or legumes equals  cup after being cooked.  One serving of nuts equals 1 ounces.  One serving of seeds equals  ounce or one tablespoon.  You may need to keep track of how much salt or sodium you eat. This is especially true if you have high blood pressure. Talk with your doctor or dietitian to get more information. What foods can I eat? Grains  Breads, including [place], white, pita, wheat, raisin, rye, oatmeal, and [place]. Tortillas that are neither fried nor made with lard or trans fat. Low-fat rolls, including hotdog and hamburger buns and English muffins. Biscuits. Muffins. Waffles. Pancakes. Light popcorn. Whole-grain cereals. Flatbread. Melba toast. Pretzels. Breadsticks. Rusks. Low-fat snacks. Low-fat crackers, including oyster, saltine, matzo, graham, animal, and rye. Rice and pasta, including brown rice and pastas that are made with whole wheat. Vegetables  All vegetables. Fruits  All fruits, but limit coconut. Meats and Other  Protein Sources  Lean, well-trimmed beef, veal, pork, and lamb. Chicken and [place] without skin. All fish and shellfish. Wild duck, rabbit, pheasant, and venison. Egg whites or low-cholesterol egg substitutes. Dried beans, peas, lentils, and tofu. Seeds and most nuts. Dairy  Low-fat or nonfat cheeses, including ricotta, string, and mozzarella. Skim or 1% milk that is liquid, powdered, or  evaporated. Buttermilk that is made with low-fat milk. Nonfat or low-fat yogurt. Beverages  Mineral water. Diet carbonated beverages. Sweets and Desserts  Sherbets and fruit ices. Honey, jam, marmalade, jelly, and syrups. Meringues and gelatins. Pure sugar candy, such as hard candy, jelly beans, gumdrops, mints, marshmallows, and small amounts of dark chocolate. [employer] Eat all sweets and desserts in moderation. Fats and Oils  Nonhydrogenated (trans-free) margarines. Vegetable oils, including soybean, sesame, sunflower, olive, peanut, safflower, corn, canola, and cottonseed. Salad dressings or mayonnaise made with a vegetable oil. Limit added fats and oils that you use for cooking, baking, salads, and as spreads. Other  Cocoa powder. Coffee and tea. All seasonings and condiments. The items listed above may not be a complete list of recommended foods or beverages. Contact your dietitian for more options.  What foods are not recommended? Grains  Breads that are made with saturated or trans fats, oils, or whole milk. Croissants. Butter rolls. Cheese breads. Sweet rolls. Donuts. Buttered popcorn. Chow mein noodles. High-fat crackers, such as cheese or butter crackers. Meats and Other Protein Sources  Fatty meats, such as hotdogs, short ribs, sausage, spareribs, bacon, rib eye roast or steak, and mutton. High-fat deli meats, such as salami and bologna. Caviar. Domestic duck and goose. Organ meats, such as kidney, liver, sweetbreads, and heart. Dairy  Cream, sour cream, cream cheese, and creamed cottage cheese. Whole-milk cheeses, including blue (bleu), Monterey Jack, [place], [place], American, [place], Swiss, cheddar, [place], and [place]. Whole or 2% milk that is liquid, evaporated, or condensed. Whole buttermilk. Cream sauce or high-fat cheese sauce. Yogurt that is made from whole milk. Beverages  Regular sodas and juice drinks with added sugar. Sweets and Desserts  Frosting. Pudding.  Cookies. Cakes other than angel food cake. Candy that has milk chocolate or white chocolate, hydrogenated fat, butter, coconut, or unknown ingredients. Buttered syrups. Full-fat ice cream or ice cream drinks. Fats and Oils  Gravy that has suet, meat fat, or shortening. Cocoa butter, hydrogenated oils, palm oil, coconut oil, palm kernel oil. These can often be found in baked products, candy, fried foods, nondairy creamers, and whipped toppings. Solid fats and shortenings, including bacon fat, salt pork, lard, and butter. Nondairy cream substitutes, such as coffee creamers and sour cream substitutes. Salad dressings that are made of unknown oils, cheese, or sour cream. The items listed above may not be a complete list of foods and beverages to avoid. Contact your dietitian for more information.  This information is not intended to replace advice given to you by your health care provider. Make sure you discuss any questions you have with your health care provider. Document Released: 03/12/2012 Document Revised: 02/17/2016 Document Reviewed: 03/05/2014 Elsevier Interactive Patient Education  2017 [place] Pneumonia, Adult Pneumonia is an infection of the lungs. One type of pneumonia can happen while a person is in a hospital. A different type can happen when a person is not in a hospital (community-acquired pneumonia). It is easy for this kind to spread from person to person. It can spread to you if you breathe near an infected person who coughs or  sneezes. Some symptoms include:  A dry cough.  A wet (productive) cough.  Fever.  Sweating.  Chest pain. Follow these instructions at home:  Take over-the-counter and prescription medicines only as told by your doctor.  Only take cough medicine if you are losing sleep.  If you were prescribed an antibiotic medicine, take it as told by your doctor. Do not stop taking the antibiotic even if you start to feel  better.  Sleep with your head and neck raised (elevated). You can do this by putting a few pillows under your head, or you can sleep in a recliner.  Do not use tobacco products. These include cigarettes, chewing tobacco, and e-cigarettes. If you need help quitting, ask your doctor.  Drink enough water to keep your pee (urine) clear or pale yellow. A shot (vaccine) can help prevent pneumonia. Shots are often suggested for:  People older than 56 years of age.  People older than 56 years of age:  Who are having cancer treatment.  Who have long-term (chronic) lung disease.  Who have problems with their body's defense system (immune system). You may also prevent pneumonia if you take these actions:  Get the flu (influenza) shot every year.  Go to the dentist as often as told.  Wash your hands often. If soap and water are not available, use hand sanitizer. Contact a doctor if:  You have a fever.  You lose sleep because your cough medicine does not help. Get help right away if:  You are short of breath and it gets worse.  You have more chest pain.  Your sickness gets worse. This is very serious if:  You are an older adult.  Your body's defense system is weak.  You cough up blood. This information is not intended to replace advice given to you by your health care provider. Make sure you discuss any questions you have with your health care provider. Document Released: 02/28/2008 Document Revised: 02/17/2016 Document Reviewed: 01/06/2015 Elsevier Interactive Patient Education  2017 [employer].

## 2017-01-20 NOTE — Progress Notes (Signed)
SATURATION QUALIFICATIONS: (This note is used to comply with regulatory documentation for home oxygen)  Patient Saturations on Room Air at Rest = 97%  Patient Saturations on Room Air while Ambulating = 95%  Patient Saturations on 0 Liters of oxygen while Ambulating = 95%  Please briefly explain why patient needs home oxygen: 

## 2017-01-20 NOTE — Progress Notes (Signed)
RT NOTE:  CPAP setup @ bedside. Pt tolerating well @ this time. Auto 15/5, 2L O2 bled in, humidity chamber filled.

## 2017-01-20 NOTE — Discharge Summary (Signed)
Physician Discharge Summary  Jaice Lague MGN:[id number] DOB: 10/09/1960 DOA: 01/19/2017  PCP: Eloise Levels, NP  Admit date: 01/19/2017 Discharge date: 01/20/2017  Admitted From:  Home  Disposition:  Home   Recommendations for Outpatient Follow-up:  1. Follow up with PCP in 4-5 days  Discharge Condition: STABLE  CODE STATUS: FULL   Brief/Interim Summary: HPI: Sharon Swanson is a 56 y.o. female with medical history significant for COPD, hypertension, insulin-dependent diabetes mellitus, OSA, and obesity with BMI 45 who presents to the emergency department for evaluation of chest pain and dyspnea. Patient reports that she developed upper respiratory symptoms with cough and also increased shortness of breath a little over a week ago, was evaluated by her PCP, diagnosed with influenza, and treated with a prednisone taper, nebs, and Tamiflu. She completed these medications over the past couple days, and while her upper respiratory symptoms have improved, her shortness of breath has continued to worsen and she has now developed chest pain. Chest pain is described as moderate to severe in intensity, dull and achy in character, localized to the left chest, constant since around noon, and with no alleviating or exacerbating factors identified. She was at rest, watching TV at time of symptom onset and has never experienced similar symptoms previously. She denies fevers or chills, denies recent long distance travel, denies any personal or family history of VTE. Patient notes that her cough is productive of thick clear, white, and yellow sputum. She also notes the development of orthopnea over the past couple weeks and has been sleeping upright. She endorses chronic lower extremity swelling which may have worsened recently. She is a current smoker and is largely immobile at baseline due to her body habitus, but denies recent surgery or travel. She does not take hormone therapy.  ED Course: Upon arrival to  the ED, patient is found to be afebrile, requiring 3 L/m supplemental oxygen to maintain saturations greater than 90, tachypneic, mildly tachycardic, and with normal blood pressure. EKG features a sinus tachycardia with rate 107 and left axis deviation. Chest x-ray is negative for acute cardiopulmonary disease. Chemistry panels notable for a glucose of 227. CBC features a chronic mild polycythemia with hemoglobin 15.5. Troponin is undetectable and BNP is within the normal limits at 56. Patient was treated with Percocet in the ED, and with IV Dilaudid. This improved her pain, but she continued to be dyspneic and continued to complain of some mild left sided chest discomfort. She will be admitted to the stepdown unit for further evaluation of this, and given her new hypoxia, tachycardia, chest pain, with obesity, general poor mobility, and cigarette smoking, she will be ruled out for PE with CTA chest.  Chest pain - atypical, secondary to pneumonia, treating with doxycycline BID, CTA chest negative for PE.  Troponins negative x 3.   Acute hypoxic Respiratory failure - multifactorial but exacerbation likely related to pneumonia, patient advised to stop all tobacco use, home oxygen will be set up if qualifies, complete course of antibiotics., symptoms have markedly improved and no longer in any distress.   COPD - resume home nebs, home oxygen, needs to stop smoking, just completed prednisone taper prior to admission.    OSA - continue nightly CPAP  Hypertension - resume home meds at discharge  IDDM - Resume home insulin regimen, follow up with PCP  Discharge Diagnoses:  Principal Problem:   Chest pain Active Problems:   Tobacco abuse   BMI 45.0-49.9, adult (HCC)   COPD (chronic obstructive pulmonary  disease) ([place])   Insulin-requiring or dependent type II diabetes mellitus ([place])   Essential hypertension   Anxiety   OSA (obstructive sleep apnea)  Discharge Instructions  Discharge Instructions     Increase activity slowly    Complete by:  As directed      Allergies as of 01/20/2017      Reactions   Aspirin    Abdominal pain      Medication List    STOP taking these medications   naproxen sodium 220 MG tablet Commonly known as:  ANAPROX   oseltamivir 75 MG capsule Commonly known as:  TAMIFLU   predniSONE 20 MG tablet Commonly known as:  DELTASONE     TAKE these medications   albuterol 108 (90 Base) MCG/ACT inhaler Commonly known as:  PROVENTIL HFA;VENTOLIN HFA Inhale 2 puffs into the lungs every 4 (four) hours as needed for wheezing or shortness of breath. What changed:  Another medication with the same name was removed. Continue taking this medication, and follow the directions you see here.   albuterol (2.5 MG/3ML) 0.083% nebulizer solution Commonly known as:  PROVENTIL Take 3 mLs (2.5 mg total) by nebulization every 4 (four) hours as needed for wheezing or shortness of breath. What changed:  Another medication with the same name was removed. Continue taking this medication, and follow the directions you see here.   diazepam 2 MG tablet Commonly known as:  VALIUM Take 1 tablet (2 mg total) by mouth every 12 (twelve) hours as needed (dizziness).   doxycycline 100 MG tablet Commonly known as:  VIBRA-TABS Take 1 tablet (100 mg total) by mouth every 12 (twelve) hours.   fluticasone 50 MCG/ACT nasal spray Commonly known as:  FLONASE Place 1 spray into both nostrils daily.   Fluticasone-Salmeterol 250-50 MCG/DOSE Aepb Commonly known as:  ADVAIR DISKUS Inhale 1 puff into the lungs 2 (two) times daily.   glucose monitoring kit monitoring kit 1 each by Does not apply route as needed for other.   guaiFENesin 100 MG/5ML Soln Commonly known as:  ROBITUSSIN Take 15 mLs (300 mg total) by mouth every 6 (six) hours as needed for cough or to loosen phlegm.   hydrochlorothiazide 12.5 MG capsule Commonly known as:  MICROZIDE Take 1 capsule (12.5 mg total) by mouth  daily.   HYDROcodone-homatropine 5-1.5 MG/5ML syrup Commonly known as:  HYCODAN Take 5 mLs by mouth every 6 (six) hours as needed for cough.   insulin aspart 100 UNIT/ML injection Commonly known as:  novoLOG Inject 5 Units into the skin 3 (three) times daily with meals.   insulin glargine 100 UNIT/ML injection Commonly known as:  LANTUS Inject 0.12 mLs (12 Units total) into the skin daily.   ipratropium 0.02 % nebulizer solution Commonly known as:  ATROVENT Take 2.5 mLs (0.5 mg total) by nebulization 4 (four) times daily.   ipratropium-albuterol 0.5-2.5 (3) MG/3ML Soln Commonly known as:  DUONEB Take 3 mLs by nebulization every 6 (six) hours.   meclizine 25 MG tablet Commonly known as:  ANTIVERT Take 1 tablet (25 mg total) by mouth 3 (three) times daily as needed for dizziness.   metFORMIN 500 MG tablet Commonly known as:  GLUCOPHAGE Take 500 mg by mouth 2 (two) times daily with a meal.   pravastatin 40 MG tablet Commonly known as:  PRAVACHOL Take 40 mg by mouth daily.   ranitidine 150 MG tablet Commonly known as:  ZANTAC Take 150 mg by mouth 2 (two) times daily.   verapamil 240  MG CR tablet Commonly known as:  CALAN-SR Take 240 mg by mouth daily.            Durable Medical Equipment        Start     Ordered   01/20/17 1158  For home use only DME oxygen  Once    Question Answer Comment  Mode or (Route) Nasal cannula   Liters per Minute 2   Frequency Continuous (stationary and portable oxygen unit needed)   Oxygen conserving device Yes   Oxygen delivery system Gas      01/20/17 1157     Follow-up Information    Eloise Levels, NP. Schedule an appointment as soon as possible for a visit in 4 day(s).   Specialty:  Nurse Practitioner Why:  Hospital Follow Up  Contact information: [id number] [address] [phone]          Allergies  Allergen Reactions  . Aspirin     Abdominal pain   Procedures/Studies: Dg Chest 2  View  Result Date: 01/19/2017 CLINICAL DATA:  LEFT-sided chest pressure, short of breath, productive cough EXAM: CHEST  2 VIEW COMPARISON:  None. FINDINGS: Normal mediastinum and cardiac silhouette. Normal pulmonary vasculature. No evidence of effusion, infiltrate, or pneumothorax. No acute bony abnormality. IMPRESSION: No acute cardiopulmonary process. Electronically Signed   By: Suzy Bouchard M.D.   On: 01/19/2017 16:25   Dg Chest 2 View  Result Date: 01/14/2017 CLINICAL DATA:  Productive cough for 3 days with chills and sweats. Headache. EXAM: CHEST  2 VIEW COMPARISON:  Chest radiographs 06/08/2016 and CT 06/29/2016 FINDINGS: The cardiac silhouette is upper limits of normal in size. The curvilinear opacity in the right lung base on the prior radiographs has resolved. Curvilinear opacity in the left lower lobe, best seen on the lateral radiograph, is unchanged from the prior CT and may reflect chronic atelectasis or scarring. There is chronic interstitial coarsening. No definite acute airspace consolidation, overt pulmonary edema, pleural effusion, or pneumothorax is identified. Thoracic spondylosis is noted. IMPRESSION: No evidence of active cardiopulmonary disease. Left lower lobe atelectasis or scarring. Electronically Signed   By: Logan Bores M.D.   On: 01/14/2017 09:07   Ct Angio Chest Pe W Or Wo Contrast  Result Date: 01/19/2017 CLINICAL DATA:  Acute respiratory failure with hypoxia. Dyspnea x1 week. EXAM: CT ANGIOGRAPHY CHEST WITH CONTRAST TECHNIQUE: Multidetector CT imaging of the chest was performed using the standard protocol during bolus administration of intravenous contrast. Multiplanar CT image reconstructions and MIPs were obtained to evaluate the vascular anatomy. CONTRAST:  01/19/2017 CXR, 01/14/2017 CXR, chest CT 06/29/2016 COMPARISON:  None. FINDINGS: Cardiovascular: Normal caliber thoracic aorta and main pulmonary artery. Minimal coronary arteriosclerosis along the left main.  Aortic atherosclerosis with mild calcifications of the arch. Normal sized cardiac chambers without pericardial effusion. Mitral annular calcifications present. No large central pulmonary embolus identified. The more peripheral segmental subsegmental arteries are limited due to patient body habitus and timing of contrast. Mediastinum/Nodes: No enlarged mediastinal, hilar, or axillary lymph nodes. Thyroid gland, trachea, and esophagus demonstrate no significant findings. Lungs/Pleura: Linear atelectasis and/or scarring in the left lower lobe. Faint patchy ground-glass opacities are noted bilaterally which may represent atypical pneumonia, alveolitis/pneumonitis and/or hypoventilatory change. No effusion or pneumothorax. Upper Abdomen: No acute abnormality. Musculoskeletal: Diffuse idiopathic skeletal hyperostosis of the dorsal spine. No acute nor suspicious osseous abnormalities. Chronic subtle healed right lower anterior rib fractures. Review of the MIP images confirms the above findings. IMPRESSION: 1. No  large central pulmonary embolus. 2. Scarring in the left lower lobe with patchy ground-glass opacities in both lungs which may reflect an atypical pneumonia, alveolitis/ pneumonitis or hypoventilatory change among some considerations. Electronically Signed   By: Ashley Royalty M.D.   On: 01/19/2017 21:01   (Echo, Carotid, EGD, Colonoscopy, ERCP)    Subjective: Pt says she has no more chest pain, her SOB is getting better.   Discharge Exam: Vitals:   01/20/17 0010 01/20/17 0315  BP:  (!) 152/85  Pulse: 85 84  Resp: 18 14  Temp:  98.1 F (36.7 C)   Vitals:   01/20/17 0010 01/20/17 0315 01/20/17 0738 01/20/17 0742  BP:  (!) 152/85    Pulse: 85 84    Resp: 18 14    Temp:  98.1 F (36.7 C)    TempSrc:  Axillary    SpO2: 95% 95% 97% 97%  Weight:  (!) 136.3 kg (300 lb 6.4 oz)    Height:        General: Pt is alert, awake, not in acute distress Cardiovascular: RRR, S1/S2 +, no rubs, no  gallops Respiratory:  no wheezing, no rhonchi Abdominal: Soft, NT, ND, bowel sounds + Extremities:  no cyanosis   The results of significant diagnostics from this hospitalization (including imaging, microbiology, ancillary and laboratory) are listed below for reference.     Microbiology: Recent Results (from the past 240 hour(s))  Culture, blood (routine x 2)     Status: None   Collection Time: 01/14/17 10:45 AM  Result Value Ref Range Status   Specimen Description BLOOD RIGHT ANTECUBITAL  Final   Special Requests   Final    BOTTLES DRAWN AEROBIC AND ANAEROBIC Blood Culture adequate volume   Culture   Final    NO GROWTH 5 DAYS Performed at [facility], [address] [place], [place] [address]    Report Status 01/19/2017 FINAL  Final  Culture, blood (routine x 2)     Status: None   Collection Time: 01/14/17 11:10 AM  Result Value Ref Range Status   Specimen Description BLOOD LEFT WRIST  Final   Special Requests   Final    BOTTLES DRAWN AEROBIC AND ANAEROBIC Blood Culture adequate volume   Culture   Final    NO GROWTH 5 DAYS Performed at [facility], [place] [address] [place], [place] [address]    Report Status 01/19/2017 FINAL  Final  MRSA PCR Screening     Status: None   Collection Time: 01/19/17  9:10 PM  Result Value Ref Range Status   MRSA by PCR NEGATIVE NEGATIVE Final    Comment:        The GeneXpert MRSA Assay (FDA approved for NASAL specimens only), is one component of a comprehensive MRSA colonization surveillance program. It is not intended to diagnose MRSA infection nor to guide or monitor treatment for MRSA infections.      Labs: BNP (last 3 results)  Recent Labs  01/14/17 1045 01/19/17 1528  BNP 38.5 84.1   Basic Metabolic Panel:  Recent Labs Lab 01/14/17 1045 01/19/17 1528 01/20/17 0727  NA 138 136 138  K 4.3 4.6 4.2  CL 101 101 99*  CO2 28 24 31   GLUCOSE 100* 227* 107*  BUN 13 15 13   CREATININE 0.54 0.82 0.68   CALCIUM 9.6 9.4 9.0   Liver Function Tests:  Recent Labs Lab 01/19/17 1528  AST 24  ALT 30  ALKPHOS 62  BILITOT 0.2*  PROT 6.6  ALBUMIN 3.6    Recent Labs Lab 01/19/17 1528  LIPASE 24   No results for input(s): AMMONIA in the last 168 hours. CBC:  Recent Labs Lab 01/14/17 1045 01/19/17 1528 01/20/17 0727  WBC 5.5 10.0 6.7  NEUTROABS 3.3  --   --   HGB 16.2* 15.5* 14.8  HCT 50.0* 48.3* 47.9*  MCV 87.9 87.7 89.7  PLT 178 205 193   Cardiac Enzymes:  Recent Labs Lab 01/19/17 1948 01/20/17 0214 01/20/17 0727  TROPONINI <0.03 <0.03 <0.03   BNP: Invalid input(s): POCBNP CBG:  Recent Labs Lab 01/19/17 2202 01/20/17 0754 01/20/17 1151  GLUCAP 178* 103* 219*   D-Dimer No results for input(s): DDIMER in the last 72 hours. Hgb A1c  Recent Labs  01/19/17 1950  HGBA1C 7.0*   Lipid Profile No results for input(s): CHOL, HDL, LDLCALC, TRIG, CHOLHDL, LDLDIRECT in the last 72 hours. Thyroid function studies No results for input(s): TSH, T4TOTAL, T3FREE, THYROIDAB in the last 72 hours.  Invalid input(s): FREET3 Anemia work up No results for input(s): VITAMINB12, FOLATE, FERRITIN, TIBC, IRON, RETICCTPCT in the last 72 hours. Urinalysis    Component Value Date/Time   COLORURINE YELLOW 08/04/2016 1647   APPEARANCEUR CLOUDY (A) 08/04/2016 1647   LABSPEC 1.014 08/04/2016 1647   PHURINE 6.0 08/04/2016 1647   GLUCOSEU NEGATIVE 08/04/2016 1647   GLUCOSEU NEGATIVE 08/12/2012 1516   HGBUR TRACE (A) 08/04/2016 1647   BILIRUBINUR NEGATIVE 08/04/2016 1647   KETONESUR NEGATIVE 08/04/2016 1647   PROTEINUR 30 (A) 08/04/2016 1647   UROBILINOGEN 1.0 08/17/2014 1522   NITRITE NEGATIVE 08/04/2016 1647   LEUKOCYTESUR NEGATIVE 08/04/2016 1647   Sepsis Labs Invalid input(s): PROCALCITONIN,  WBC,  LACTICIDVEN Microbiology Recent Results (from the past 240 hour(s))  Culture, blood (routine x 2)     Status: None   Collection Time: 01/14/17 10:45 AM  Result Value  Ref Range Status   Specimen Description BLOOD RIGHT ANTECUBITAL  Final   Special Requests   Final    BOTTLES DRAWN AEROBIC AND ANAEROBIC Blood Culture adequate volume   Culture   Final    NO GROWTH 5 DAYS Performed at [facility], [place] [address] [place], [place] [address]    Report Status 01/19/2017 FINAL  Final  Culture, blood (routine x 2)     Status: None   Collection Time: 01/14/17 11:10 AM  Result Value Ref Range Status   Specimen Description BLOOD LEFT WRIST  Final   Special Requests   Final    BOTTLES DRAWN AEROBIC AND ANAEROBIC Blood Culture adequate volume   Culture   Final    NO GROWTH 5 DAYS Performed at [facility], [place] [address] [place], [place] [address]    Report Status 01/19/2017 FINAL  Final  MRSA PCR Screening     Status: None   Collection Time: 01/19/17  9:10 PM  Result Value Ref Range Status   MRSA by PCR NEGATIVE NEGATIVE Final    Comment:        The GeneXpert MRSA Assay (FDA approved for NASAL specimens only), is one component of a comprehensive MRSA colonization surveillance program. It is not intended to diagnose MRSA infection nor to guide or monitor treatment for MRSA infections.    SIGNED:  Irwin Brakeman, MD  [facility] 01/20/2017, 11:59 AM Pager [phone]  If 7PM-7AM, please contact night-coverage [email or web address] Password [email or web address]

## 2017-01-20 NOTE — Care Management Note (Signed)
Case Management Note  Patient Details  Name: Sharon Swanson MRN: [id number] Date of Birth: [birth year]  Subjective/Objective:                 Spoke w bedside RN, states patient was 95% on RA when walking, and that patient had no other needs through CM. No CM needs, orders, or consults in place.    Action/Plan:  DC to home.  Expected Discharge Date:  01/20/17               Expected Discharge Plan:  Home/Self Care  In-House Referral:     Discharge planning Services  CM Consult  Post Acute Care Choice:    Choice offered to:     DME Arranged:    DME Agency:     HH Arranged:    HH Agency:     Status of Service:  Completed, signed off  If discussed at [employer] of Stay Meetings, dates discussed:    Additional Comments:  Carles Collet, RN 01/20/2017, 2:58 PM

## 2017-04-09 ENCOUNTER — Encounter: Payer: Self-pay | Admitting: Pulmonary Disease

## 2017-04-09 ENCOUNTER — Ambulatory Visit (INDEPENDENT_AMBULATORY_CARE_PROVIDER_SITE_OTHER): Payer: BC Managed Care – PPO | Admitting: Pulmonary Disease

## 2017-04-09 VITALS — BP 160/100 | HR 97 | Ht 67.0 in | Wt 306.0 lb

## 2017-04-09 DIAGNOSIS — R0602 Shortness of breath: Secondary | ICD-10-CM | POA: Diagnosis not present

## 2017-04-09 DIAGNOSIS — G4733 Obstructive sleep apnea (adult) (pediatric): Secondary | ICD-10-CM

## 2017-04-09 LAB — NITRIC OXIDE: NITRIC OXIDE: 5

## 2017-04-09 NOTE — Progress Notes (Signed)
Sharon Swanson    [id number]    1960/11/29  Primary Care Physician:Brown, Anderson Malta, NP  Referring Physician: Eloise Levels, NP [phone], [place] [address]  Chief complaint:   Follow up for Asthma OSA  HPI: 57 y/o morbidly obese female, smoker (1/2 ppd for 35 years, began at age 74) with PMH of HTN, HLD, DM, fatty liver, fibroids, pancreatitis, ventral hernia childhood asthma & GERD who was admitted to [facility] on 06/04/16 with RLL CAP, COPD exacerbation  Since her discharge she feels that her breathing has improved but is not back to baseline. She still has occasional cough with sputum production, dyspnea, wheezing. She denies any fevers, chills, hemoptysis. She's had a recent sleep study that shows moderate sleep apnea.  Interim History: CPAP ordered but she declined as she cannot afford the 100$ copay. Continues on advair.  Seen by PCP and noted to have high BP. Her HTN meds have been adjusted.   Outpatient Encounter Prescriptions as of 04/09/2017  Medication Sig  . albuterol (PROVENTIL HFA;VENTOLIN HFA) 108 (90 Base) MCG/ACT inhaler Inhale 2 puffs into the lungs every 4 (four) hours as needed for wheezing or shortness of breath.  Marland Kitchen albuterol (PROVENTIL) (2.5 MG/3ML) 0.083% nebulizer solution Take 3 mLs (2.5 mg total) by nebulization every 4 (four) hours as needed for wheezing or shortness of breath.  . fluticasone (FLONASE) 50 MCG/ACT nasal spray Place 1 spray into both nostrils daily.  . Fluticasone-Salmeterol (ADVAIR DISKUS) 250-50 MCG/DOSE AEPB Inhale 1 puff into the lungs 2 (two) times daily.  Marland Kitchen glucose monitoring kit (FREESTYLE) monitoring kit 1 each by Does not apply route as needed for other.  . guaiFENesin (ROBITUSSIN) 100 MG/5ML SOLN Take 15 mLs (300 mg total) by mouth every 6 (six) hours as needed for cough or to loosen phlegm.  . hydrochlorothiazide (MICROZIDE) 12.5 MG capsule Take 1 capsule (12.5 mg total) by mouth daily.  Marland Kitchen HYDROcodone-homatropine  (HYCODAN) 5-1.5 MG/5ML syrup Take 5 mLs by mouth every 6 (six) hours as needed for cough.  . insulin aspart (NOVOLOG) 100 UNIT/ML injection Inject 5 Units into the skin 3 (three) times daily with meals.  . insulin glargine (LANTUS) 100 UNIT/ML injection Inject 0.12 mLs (12 Units total) into the skin daily.  Marland Kitchen ipratropium (ATROVENT) 0.02 % nebulizer solution Take 2.5 mLs (0.5 mg total) by nebulization 4 (four) times daily.  Marland Kitchen ipratropium-albuterol (DUONEB) 0.5-2.5 (3) MG/3ML SOLN Take 3 mLs by nebulization every 6 (six) hours.  . meclizine (ANTIVERT) 25 MG tablet Take 1 tablet (25 mg total) by mouth 3 (three) times daily as needed for dizziness.  . metFORMIN (GLUCOPHAGE) 500 MG tablet Take 500 mg by mouth 2 (two) times daily with a meal.  . pravastatin (PRAVACHOL) 40 MG tablet Take 40 mg by mouth daily.  . ranitidine (ZANTAC) 150 MG tablet Take 150 mg by mouth 2 (two) times daily.  . verapamil (CALAN-SR) 240 MG CR tablet Take 240 mg by mouth daily.  . [DISCONTINUED] diazepam (VALIUM) 2 MG tablet Take 1 tablet (2 mg total) by mouth every 12 (twelve) hours as needed (dizziness).   No facility-administered encounter medications on file as of 04/09/2017.     Allergies as of 04/09/2017 - Review Complete 04/09/2017  Allergen Reaction Noted  . Aspirin  10/21/2011    Past Medical History:  Diagnosis Date  . Acid reflux   . Asthma   . COPD (chronic obstructive pulmonary disease) ([place])   . Fatty liver  11/08/2011  . Fibroids 11/08/2011  . Hypertension   . Obesity (BMI 30-39.9) 11/08/2011  . Pancreatitis 11/08/2011  . Ventral hernia     Past Surgical History:  Procedure Laterality Date  . ORTHOPEDIC SURGERY     R ankle, tendonitis    Family History  Problem Relation Age of Onset  . Hypertension Mother   . Diabetes Other     Social History   Social History  . Marital status: Single    Spouse name: N/A  . Number of children: N/A  . Years of education: N/A   Occupational History  .  Not on file.   Social History Main Topics  . Smoking status: Current Every Day Smoker    Packs/day: 0.50    Types: Cigarettes  . Smokeless tobacco: Never Used  . Alcohol use No  . Drug use: No  . Sexual activity: No   Other Topics Concern  . Not on file   Social History Narrative  . No narrative on file   Review of systems: Review of Systems  Constitutional: Negative for fever and chills.  HENT: Negative.   Eyes: Negative for blurred vision.  Respiratory: as per HPI  Cardiovascular: Negative for chest pain and palpitations.  Gastrointestinal: Negative for vomiting, diarrhea, blood per rectum. Genitourinary: Negative for dysuria, urgency, frequency and hematuria.  Musculoskeletal: Negative for myalgias, back pain and joint pain.  Skin: Negative for itching and rash.  Neurological: Negative for dizziness, tremors, focal weakness, seizures and loss of consciousness.  Endo/Heme/Allergies: Negative for environmental allergies.  Psychiatric/Behavioral: Negative for depression, suicidal ideas and hallucinations.  All other systems reviewed and are negative.  Physical Exam: Blood pressure (!) 160/100, pulse 97, height _0  (1.702 m), weight (!) 306 lb (138.8 kg), SpO2 93 %. Gen:      No acute distress HEENT:  EOMI, sclera anicteric Neck:     No masses; no thyromegaly Lungs:    Clear to auscultation bilaterally; normal respiratory effort CV:         Regular rate and rhythm; no murmurs Abd:      + bowel sounds; soft, non-tender; no palpable masses, no distension Ext:    No edema; adequate peripheral perfusion Skin:      Warm and dry; no rash Neuro: alert and oriented x 3 Psych: normal mood and affect  Data Reviewed: CT chest 08/17/14- multifocal groundglass opacities, left upper lobe nodule or changes. CXR 06/04/16-right lower lobe opacity suggestive of pneumonia. Echo 06/05/16- moderate LV concentric hypertrophy, normal systolic function, LVEF 40-98%, no RWMA, grade 1 diastolic  dysfunction CT scan 06/29/16-resolution of consolidation and lung opacities, mild bronchiectasis in the right middle lobe. Suggestion of airtrapping I have reviewed all images personally.  Sleep study 06/16/16 Moderate OSA, AHI 18 Severe O2 desaturations.  PFTs 12/20/16 FVC 1.26 (40%) FEV1 1.19 (48%) F/F 95 TLC 58% DLCO 63%, corrected DLCO 116% Moderate restriction with diffusion defect.  FENO 04/09/16- < 5  Assessment:   #1 Dyspnea secondary to obesity, small airway disease She is a diagnosis of childhood asthma but lung function tests do not show any obstruction. There is a suggestion of small airway disease given air trapping on CT scan and reduction in mid flow rates. She has responded well to the Advair and albuterol PRN and will continue the same.   The PFTs also show restriction with DLCO impairment. However the DLCO corrects for alveolar volume. There is no evidence of interstitial lung disease on CT scan. I believe the PFT  abnormalities are secondary to obesity and body habitus which are contributing to dyspnea..   #2 Admission for CAP last year She has recovered from that episode. Follow-up CT scan shows resolution of lung infiltrates with stable bronchiectasis, mild scarring in the right middle lobe.  #3 OSA Moderate OSA with desaturation. CPAP ordered but she cannot afford it. We will see if she can get on some kind of paitent assistance for it.   # 4 Active smoker. Smoking cessation encouraged. She is cutting down and trying to quit on her own. I have asked her to start nicotine patches to help in this process. Time spent some counseling-5 minutes.  Plan/Recommendations: - CPAP. Needs patient assistance - Continue advair, albuterol - Smoking cessation  Marshell Garfinkel MD [facility] Pager [phone] 04/09/2017, 4:08 PM  CC: Eloise Levels, NP

## 2017-04-09 NOTE — Patient Instructions (Signed)
We will check if you can get on patient assistance to be able to afford the CPAP Continue using the advair., albuterol  You need to quit smoking  Return in 3 months

## 2017-04-26 ENCOUNTER — Telehealth: Payer: Self-pay

## 2017-04-26 NOTE — Telephone Encounter (Signed)
-----   Message from Shon Hale, [place] sent at 04/26/2017  9:52 AM EDT -----   ----- Message ----- From: Marshell Garfinkel, MD Sent: 04/25/2017   7:00 AM To: Jackolyn Confer, MD, Shon Hale, CMA  Let me see if I can see her sooner in office. Some of the difficulty we have been dealing with are her inability to afford treatments such as CPAP and continued smoking.   Lesleigh Noe- can you check the schedule to see if we can fit her in.  Thanks Praveen Mannam  ----- Message ----- From: Jackolyn Confer, MD Sent: 04/20/2017  11:00 AM To: Marshell Garfinkel, MD  Dr. Vaughan Browner,  She was referred to me by her primary NP because of a symptomatic umbilical hernia.  She got short of breath getting onto the examining table.  I told her she was too high risk for an umbilical hernia repair.  Her weight along significantly increases her recurrence rate not to mention the coughing.   Any chance you can improve her pulmonary status over time to decrease her risk?  Thank you,  [facility]

## 2017-04-26 NOTE — Telephone Encounter (Signed)
Pt has scheduled with PM for 05/22/17 @ 4:15 Pt is aware and voiced her understanding Nothing further needed.

## 2017-05-02 DIAGNOSIS — J432 Centrilobular emphysema: Secondary | ICD-10-CM | POA: Insufficient documentation

## 2017-05-22 ENCOUNTER — Ambulatory Visit: Payer: BC Managed Care – PPO | Admitting: Pulmonary Disease

## 2017-07-04 ENCOUNTER — Emergency Department (HOSPITAL_COMMUNITY)
Admission: EM | Admit: 2017-07-04 | Discharge: 2017-07-04 | Disposition: A | Discharge status: Home / Self Care | Payer: BC Managed Care – PPO | Attending: Emergency Medicine | Admitting: Emergency Medicine

## 2017-07-04 ENCOUNTER — Encounter (HOSPITAL_COMMUNITY): Payer: Self-pay | Admitting: Emergency Medicine

## 2017-07-04 ENCOUNTER — Emergency Department (HOSPITAL_COMMUNITY): Payer: BC Managed Care – PPO

## 2017-07-04 DIAGNOSIS — R0602 Shortness of breath: Secondary | ICD-10-CM | POA: Diagnosis present

## 2017-07-04 DIAGNOSIS — J45909 Unspecified asthma, uncomplicated: Secondary | ICD-10-CM | POA: Insufficient documentation

## 2017-07-04 DIAGNOSIS — Z794 Long term (current) use of insulin: Secondary | ICD-10-CM | POA: Diagnosis not present

## 2017-07-04 DIAGNOSIS — I10 Essential (primary) hypertension: Secondary | ICD-10-CM | POA: Insufficient documentation

## 2017-07-04 DIAGNOSIS — E119 Type 2 diabetes mellitus without complications: Secondary | ICD-10-CM | POA: Diagnosis not present

## 2017-07-04 DIAGNOSIS — R52 Pain, unspecified: Secondary | ICD-10-CM | POA: Insufficient documentation

## 2017-07-04 DIAGNOSIS — J441 Chronic obstructive pulmonary disease with (acute) exacerbation: Secondary | ICD-10-CM | POA: Diagnosis not present

## 2017-07-04 DIAGNOSIS — Z7902 Long term (current) use of antithrombotics/antiplatelets: Secondary | ICD-10-CM | POA: Diagnosis not present

## 2017-07-04 DIAGNOSIS — Z79899 Other long term (current) drug therapy: Secondary | ICD-10-CM | POA: Diagnosis not present

## 2017-07-04 DIAGNOSIS — F17210 Nicotine dependence, cigarettes, uncomplicated: Secondary | ICD-10-CM | POA: Diagnosis not present

## 2017-07-04 LAB — GLUCOSE, CAPILLARY: GLUCOSE-CAPILLARY: 89 mg/dL (ref 65–99)

## 2017-07-04 MED ORDER — PREDNISONE 20 MG PO TABS
60.0000 mg | ORAL_TABLET | Freq: Once | ORAL | Status: AC
  Administered 2017-07-04: 60 mg via ORAL
  Filled 2017-07-04: qty 3

## 2017-07-04 MED ORDER — PREDNISONE 10 MG PO TABS: 60.0000 mg | ORAL_TABLET | Freq: Every day | ORAL | 0 refills | Status: DC

## 2017-07-04 MED ORDER — MOMETASONE FURO-FORMOTEROL FUM 100-5 MCG/ACT IN AERO: 2.0000 | INHALATION_SPRAY | Freq: Two times a day (BID) | RESPIRATORY_TRACT | 0 refills | Status: DC

## 2017-07-04 MED ORDER — ALBUTEROL SULFATE (2.5 MG/3ML) 0.083% IN NEBU: 2.5000 mg | INHALATION_SOLUTION | Freq: Four times a day (QID) | RESPIRATORY_TRACT | 0 refills | Status: DC | PRN

## 2017-07-04 MED ORDER — ALBUTEROL SULFATE (2.5 MG/3ML) 0.083% IN NEBU
5.0000 mg | INHALATION_SOLUTION | Freq: Once | RESPIRATORY_TRACT | Status: AC
  Administered 2017-07-04: 5 mg via RESPIRATORY_TRACT
  Filled 2017-07-04: qty 6

## 2017-07-04 MED ORDER — ALBUTEROL (5 MG/ML) CONTINUOUS INHALATION SOLN
10.0000 mg/h | INHALATION_SOLUTION | Freq: Once | RESPIRATORY_TRACT | Status: AC
  Administered 2017-07-04: 10 mg/h via RESPIRATORY_TRACT
  Filled 2017-07-04: qty 20

## 2017-07-04 MED ORDER — ACETAMINOPHEN 325 MG PO TABS
650.0000 mg | ORAL_TABLET | Freq: Four times a day (QID) | ORAL | Status: DC | PRN
  Administered 2017-07-04: 650 mg via ORAL
  Filled 2017-07-04: qty 2

## 2017-07-04 MED ORDER — IPRATROPIUM BROMIDE 0.02 % IN SOLN
0.5000 mg | Freq: Once | RESPIRATORY_TRACT | Status: AC
  Administered 2017-07-04: 0.5 mg via RESPIRATORY_TRACT
  Filled 2017-07-04: qty 2.5

## 2017-07-04 MED ORDER — AZITHROMYCIN 250 MG PO TABS: 250.0000 mg | ORAL_TABLET | Freq: Every day | ORAL | 0 refills | Status: DC

## 2017-07-04 NOTE — ED Notes (Signed)
ED Provider at bedside. 

## 2017-07-04 NOTE — ED Provider Notes (Signed)
[facility] DEPT Provider Note   CSN: [id number] Arrival date & time: 07/04/17  8502     History   Chief Complaint Chief Complaint  Patient presents with  . Otalgia  . Cough  . Generalized Body Aches    HPI Sharon Swanson is a 56 y.o. female.  HPI Patient with history of COPD comes in with chief complaint of shortness of breath. Patient also reports having generalized body aches, left-sided earache, malaise, and subjective fevers. Patient started getting sick a week ago. Her cough is nonproductive. Patient has had increased wheezing however with some shortness of breath. Patient has been taking her medications as prescribed. Patient is an active smoker. Patient denies any other upper respiratory infection like symptoms.  Past Medical History:  Diagnosis Date  . Acid reflux   . Asthma   . COPD (chronic obstructive pulmonary disease) ([place]   . Fatty liver 11/08/2011  . Fibroids 11/08/2011  . Hypertension   . Obesity (BMI 30-39.9) 11/08/2011  . Pancreatitis 11/08/2011  . Ventral hernia     Patient Active Problem List   Diagnosis Date Noted  . Insulin-requiring or dependent type II diabetes mellitus ([place]) 01/19/2017  . Essential hypertension 01/19/2017  . Anxiety 01/19/2017  . Chest pain 01/19/2017  . OSA (obstructive sleep apnea) 01/19/2017  . COPD (chronic obstructive pulmonary disease) ([place])   . Acute respiratory failure ([place]) 08/17/2014  . Umbilical hernia 77/41/2878  . Pulmonary nodule, left 08/17/2014  . Constipation, chronic 08/15/2012  . GERD (gastroesophageal reflux disease) 11/08/2011  . Tobacco abuse 11/08/2011  . Fatty liver 11/08/2011  . BMI 45.0-49.9, adult ([place]) 11/08/2011  . Fibroids 11/08/2011    Past Surgical History:  Procedure Laterality Date  . ORTHOPEDIC SURGERY     R ankle, tendonitis    OB History    Gravida Para Term Preterm AB Living   0 0 0 0 0 0   SAB TAB Ectopic Multiple Live Births   0 0 0 0         Home Medications     Prior to Admission medications   Medication Sig Start Date End Date Taking? Authorizing Provider  albuterol (PROVENTIL HFA;VENTOLIN HFA) 108 (90 Base) MCG/ACT inhaler Inhale 2 puffs into the lungs every 4 (four) hours as needed for wheezing or shortness of breath. 12/20/16   Mannam, Praveen, MD  albuterol (PROVENTIL) (2.5 MG/3ML) 0.083% nebulizer solution Take 3 mLs (2.5 mg total) by nebulization every 6 (six) hours as needed for wheezing or shortness of breath. 07/04/17   Varney Biles, MD  azithromycin (ZITHROMAX) 250 MG tablet Take 1 tablet (250 mg total) by mouth daily. Take first 2 tablets together, then 1 every day until finished. 07/04/17   Varney Biles, MD  fluticasone (FLONASE) 50 MCG/ACT nasal spray Place 1 spray into both nostrils daily. 06/11/16   Hollice Gong, Mir Mohammed, MD  Fluticasone-Salmeterol (ADVAIR DISKUS) 250-50 MCG/DOSE AEPB Inhale 1 puff into the lungs 2 (two) times daily. 12/20/16   Mannam, Hart Robinsons, MD  glucose monitoring kit (FREESTYLE) monitoring kit 1 each by Does not apply route as needed for other. 06/10/16   Hollice Gong, Mir Earlie Server, MD  guaiFENesin (ROBITUSSIN) 100 MG/5ML SOLN Take 15 mLs (300 mg total) by mouth every 6 (six) hours as needed for cough or to loosen phlegm. 01/20/17   Johnson, Clanford L, MD  hydrochlorothiazide (MICROZIDE) 12.5 MG capsule Take 1 capsule (12.5 mg total) by mouth daily. 06/10/16   Hollice Gong, Mir Mohammed, MD  HYDROcodone-homatropine [facility]) 5-1.5 MG/5ML syrup Take 5  mLs by mouth every 6 (six) hours as needed for cough. 01/14/17   Charlesetta Shanks, MD  insulin aspart (NOVOLOG) 100 UNIT/ML injection Inject 5 Units into the skin 3 (three) times daily with meals. 06/10/16   Hollice Gong, Mir Mohammed, MD  insulin glargine (LANTUS) 100 UNIT/ML injection Inject 0.12 mLs (12 Units total) into the skin daily. 06/11/16   Hollice Gong, Mir Mohammed, MD  ipratropium (ATROVENT) 0.02 % nebulizer solution Take 2.5 mLs (0.5 mg total) by nebulization 4 (four)  times daily. 01/14/17   Charlesetta Shanks, MD  ipratropium-albuterol (DUONEB) 0.5-2.5 (3) MG/3ML SOLN Take 3 mLs by nebulization every 6 (six) hours. 08/22/14   Hosie Poisson, MD  meclizine (ANTIVERT) 25 MG tablet Take 1 tablet (25 mg total) by mouth 3 (three) times daily as needed for dizziness. 08/05/16   Leo Grosser, MD  metFORMIN (GLUCOPHAGE) 500 MG tablet Take 500 mg by mouth 2 (two) times daily with a meal. 04/27/16   [provider]  mometasone-formoterol (DULERA) 100-5 MCG/ACT AERO Inhale 2 puffs into the lungs 2 (two) times daily. 07/04/17   Varney Biles, MD  pravastatin (PRAVACHOL) 40 MG tablet Take 40 mg by mouth daily. 04/27/16 04/27/17  [provider]  predniSONE (DELTASONE) 10 MG tablet Take 6 tablets (60 mg total) by mouth daily. 07/04/17   Varney Biles, MD  ranitidine (ZANTAC) 150 MG tablet Take 150 mg by mouth 2 (two) times daily. 04/25/16 04/25/17  [provider]  verapamil (CALAN-SR) 240 MG CR tablet Take 240 mg by mouth daily. 06/02/16   [provider]    Family History Family History  Problem Relation Age of Onset  . Hypertension Mother   . Diabetes Other     Social History Social History  Substance Use Topics  . Smoking status: Current Every Day Smoker    Packs/day: 0.50    Types: Cigarettes  . Smokeless tobacco: Never Used  . Alcohol use No     Allergies   Aspirin   Review of Systems Review of Systems  Constitutional: Positive for activity change.  HENT: Positive for congestion and postnasal drip. Negative for rhinorrhea and sore throat.   Respiratory: Positive for cough, shortness of breath and wheezing.   Allergic/Immunologic: Negative for immunocompromised state.  All other systems reviewed and are negative.    Physical Exam Updated Vital Signs BP (!) 171/101 (BP Location: Right Arm)   Pulse 96   Temp 98.6 F (37 C) (Oral)   Resp 18   Ht 5' 7"  (1.702 m)   Wt 136.1 kg (300 lb)   SpO2 98%   BMI 46.99 kg/m    Physical Exam  Constitutional: She is oriented to person, place, and time. She appears well-developed.  HENT:  Head: Normocephalic and atraumatic.  Right Ear: External ear normal.  Left Ear: External ear normal.  Eyes: EOM are normal.  Neck: Normal range of motion. Neck supple.  Cardiovascular: Normal rate.   Pulmonary/Chest: Effort normal. No respiratory distress. She has wheezes.  Diffuse wheezing and rhonchorous breath sounds without any respiratory distress  Abdominal: Bowel sounds are normal.  Neurological: She is alert and oriented to person, place, and time.  Skin: Skin is warm and dry.  Nursing note and vitals reviewed.    ED Treatments / Results  Labs (all labs ordered are listed, but only abnormal results are displayed) Labs Reviewed  GLUCOSE, CAPILLARY  CBG MONITORING, ED    EKG  EKG Interpretation None       Radiology Dg Chest  2 View  Result Date: 07/04/2017 CLINICAL DATA:  56 year old female with nonproductive cough for 1 week. Wheezing, body ache, left ear pain. EXAM: CHEST  2 VIEW COMPARISON:  Chest CTA 01/19/2017 and earlier. FINDINGS: Stable mild-to-moderate cardiomegaly. Other mediastinal contours are within normal limits. Visualized tracheal air column is within normal limits. Lung volumes are within normal limits. No pneumothorax, pulmonary edema, pleural effusion or confluent pulmonary opacity. Interstitium appears stable. No acute osseous abnormality identified. Negative visible bowel gas pattern. IMPRESSION: No acute cardiopulmonary abnormality. Electronically Signed   By: Genevie Ann M.D.   On: 07/04/2017 10:49    Procedures Procedures (including critical care time)  Medications Ordered in ED Medications  acetaminophen (TYLENOL) tablet 650 mg (650 mg Oral Given 07/04/17 1122)  albuterol (PROVENTIL) (2.5 MG/3ML) 0.083% nebulizer solution 5 mg (5 mg Nebulization Given 07/04/17 1026)  albuterol (PROVENTIL,VENTOLIN) solution continuous neb (10 mg/hr  Nebulization Given 07/04/17 1146)  ipratropium (ATROVENT) nebulizer solution 0.5 mg (0.5 mg Nebulization Given 07/04/17 1146)  predniSONE (DELTASONE) tablet 60 mg (60 mg Oral Given 07/04/17 1122)     Initial Impression / Assessment and Plan / ED Course  I have reviewed the triage vital signs and the nursing notes.  Pertinent labs & imaging results that were available during my care of the patient were reviewed by me and considered in my medical decision making (see chart for details).  Clinical Course as of Jul 05 1319  Wed Jul 04, 2017  1319 Repeat exam reveals clearing of wheezing in all lung fields - rhonchi still present. Patient is not in any respiratory distress nor is there hypoxia.   [AN]    Clinical Course User Index [AN] Varney Biles, MD    Patient comes in with chief complaint of cough, shortness of breath, wheezing, congestion and earaches. Symptoms have been going on for about 5 days now, and worsening. I suspect that patient has an underlying upper respiratory infection that is viral in etiology. However patient also reports subjective fevers with a new cough and she is an active smoker. As such we will treat her like a COPD exacerbation.   Final Clinical Impressions(s) / ED Diagnoses   Final diagnoses:  COPD with acute exacerbation (HCC)    New Prescriptions New Prescriptions   AZITHROMYCIN (ZITHROMAX) 250 MG TABLET    Take 1 tablet (250 mg total) by mouth daily. Take first 2 tablets together, then 1 every day until finished.   MOMETASONE-FORMOTEROL (DULERA) 100-5 MCG/ACT AERO    Inhale 2 puffs into the lungs 2 (two) times daily.   PREDNISONE (DELTASONE) 10 MG TABLET    Take 6 tablets (60 mg total) by mouth daily.     Varney Biles, MD 07/04/17 1320

## 2017-07-04 NOTE — ED Triage Notes (Signed)
Pt c/o nonproductive cough x 7 days, mild wheezing, generalized body aches and L ear pain. Pt states cough and wheezing has gradually worsened. Pt states back is tight and painful r/t coughing. Pt has tried Nyquil and no relief.

## 2017-07-04 NOTE — ED Notes (Signed)
Bed: WTR6 Expected date:  Expected time:  Means of arrival:  Comments: 

## 2017-07-04 NOTE — Discharge Instructions (Signed)
We saw you in the ER for your breathing related complains. We gave you some breathing treatments in the ER, and seems like your symptoms have improved. °Please take albuterol as needed every 4 hours. °Please take the medications prescribed. °Please refrain from smoking or smoke exposure. °Please see a primary care doctor in 1 week. °Return to the ER if your symptoms worsen. ° °

## 2017-07-06 DIAGNOSIS — R059 Cough, unspecified: Secondary | ICD-10-CM | POA: Insufficient documentation

## 2017-07-10 ENCOUNTER — Ambulatory Visit: Payer: BC Managed Care – PPO | Admitting: Pulmonary Disease

## 2017-11-15 IMAGING — DX DG CHEST 2V
2 series · 2 of 2 positions shown · non-contrast
Comparison: None.

CLINICAL DATA: LEFT-sided chest pressure, short of breath,
productive cough

EXAM:
CHEST  2 VIEW

[w chest pa]
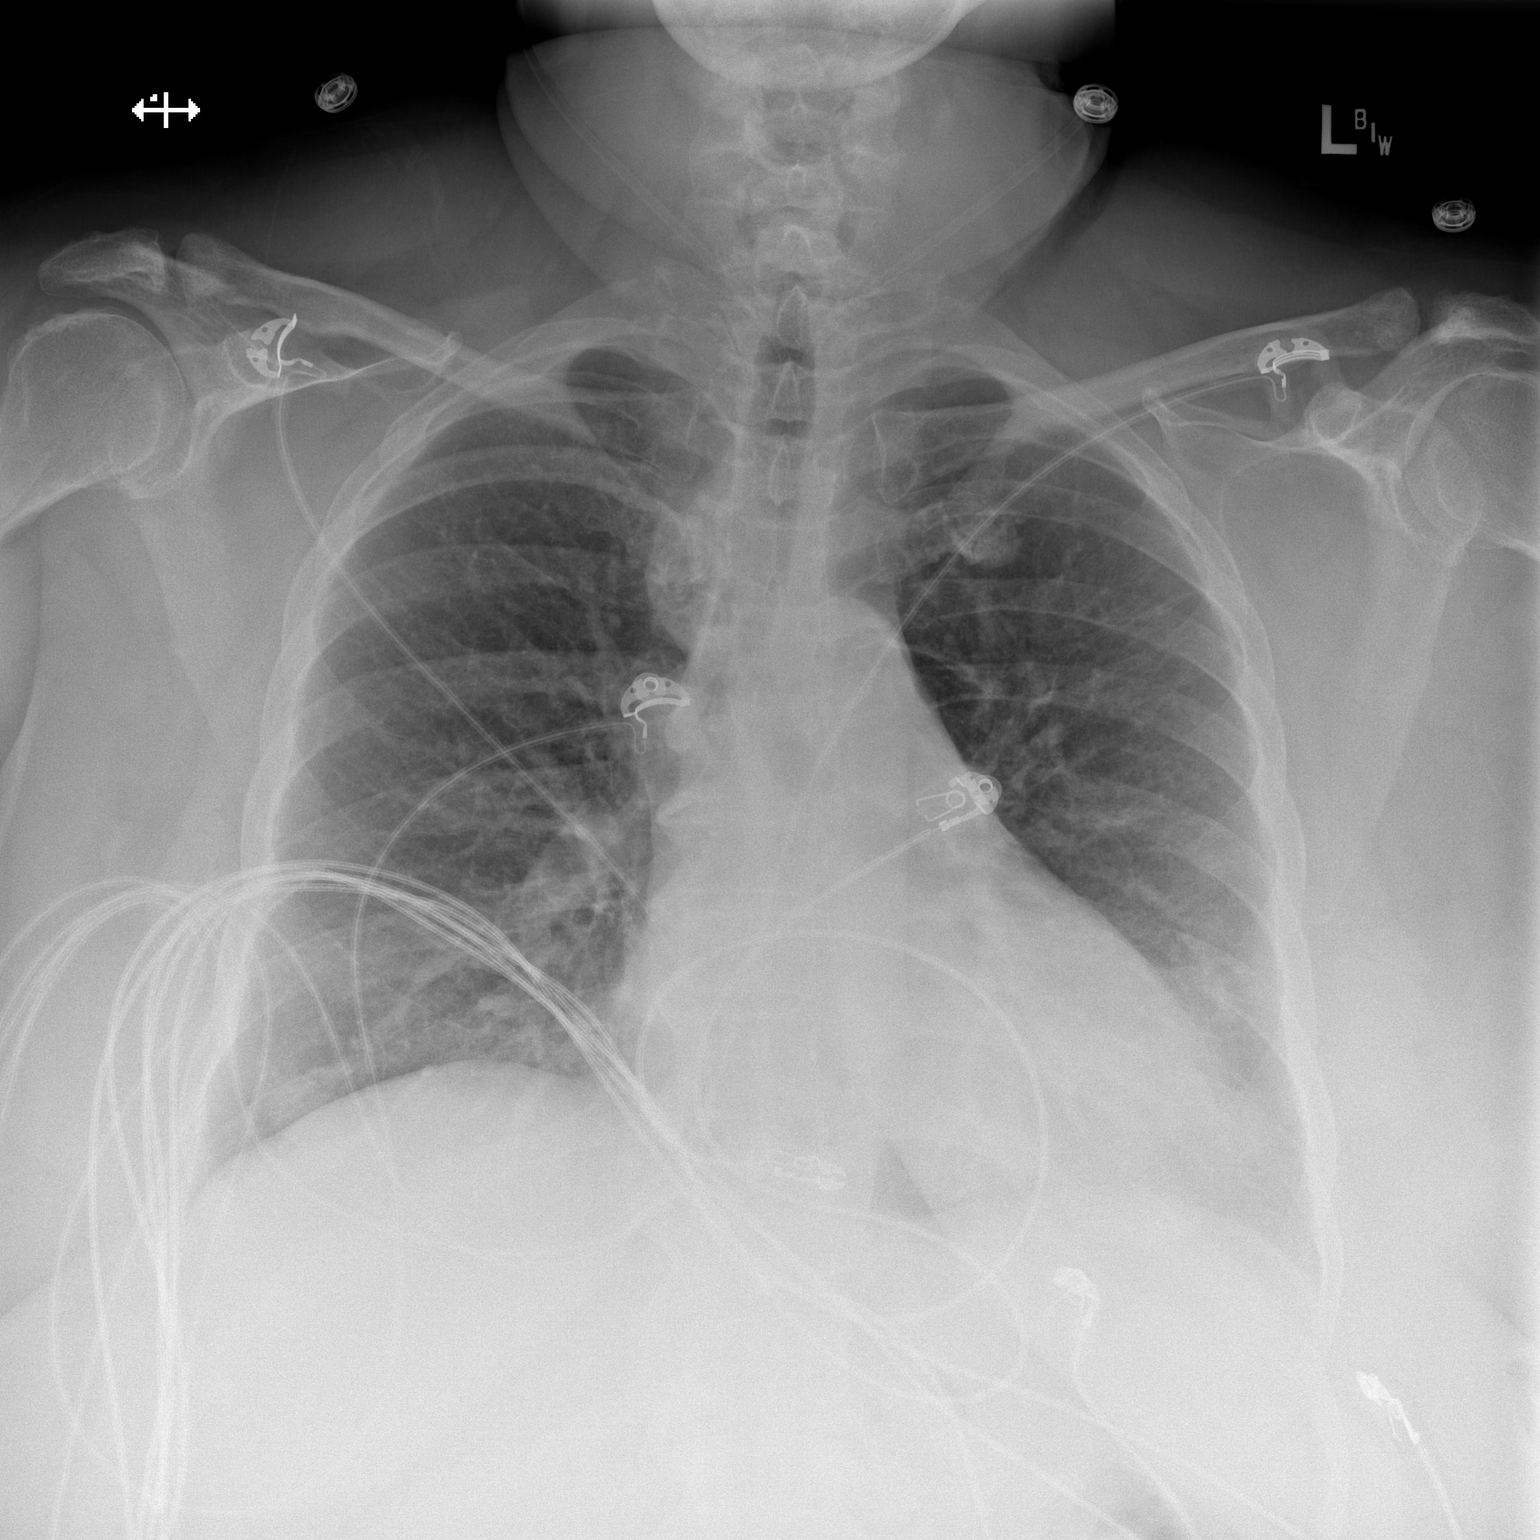

[w chest lat]
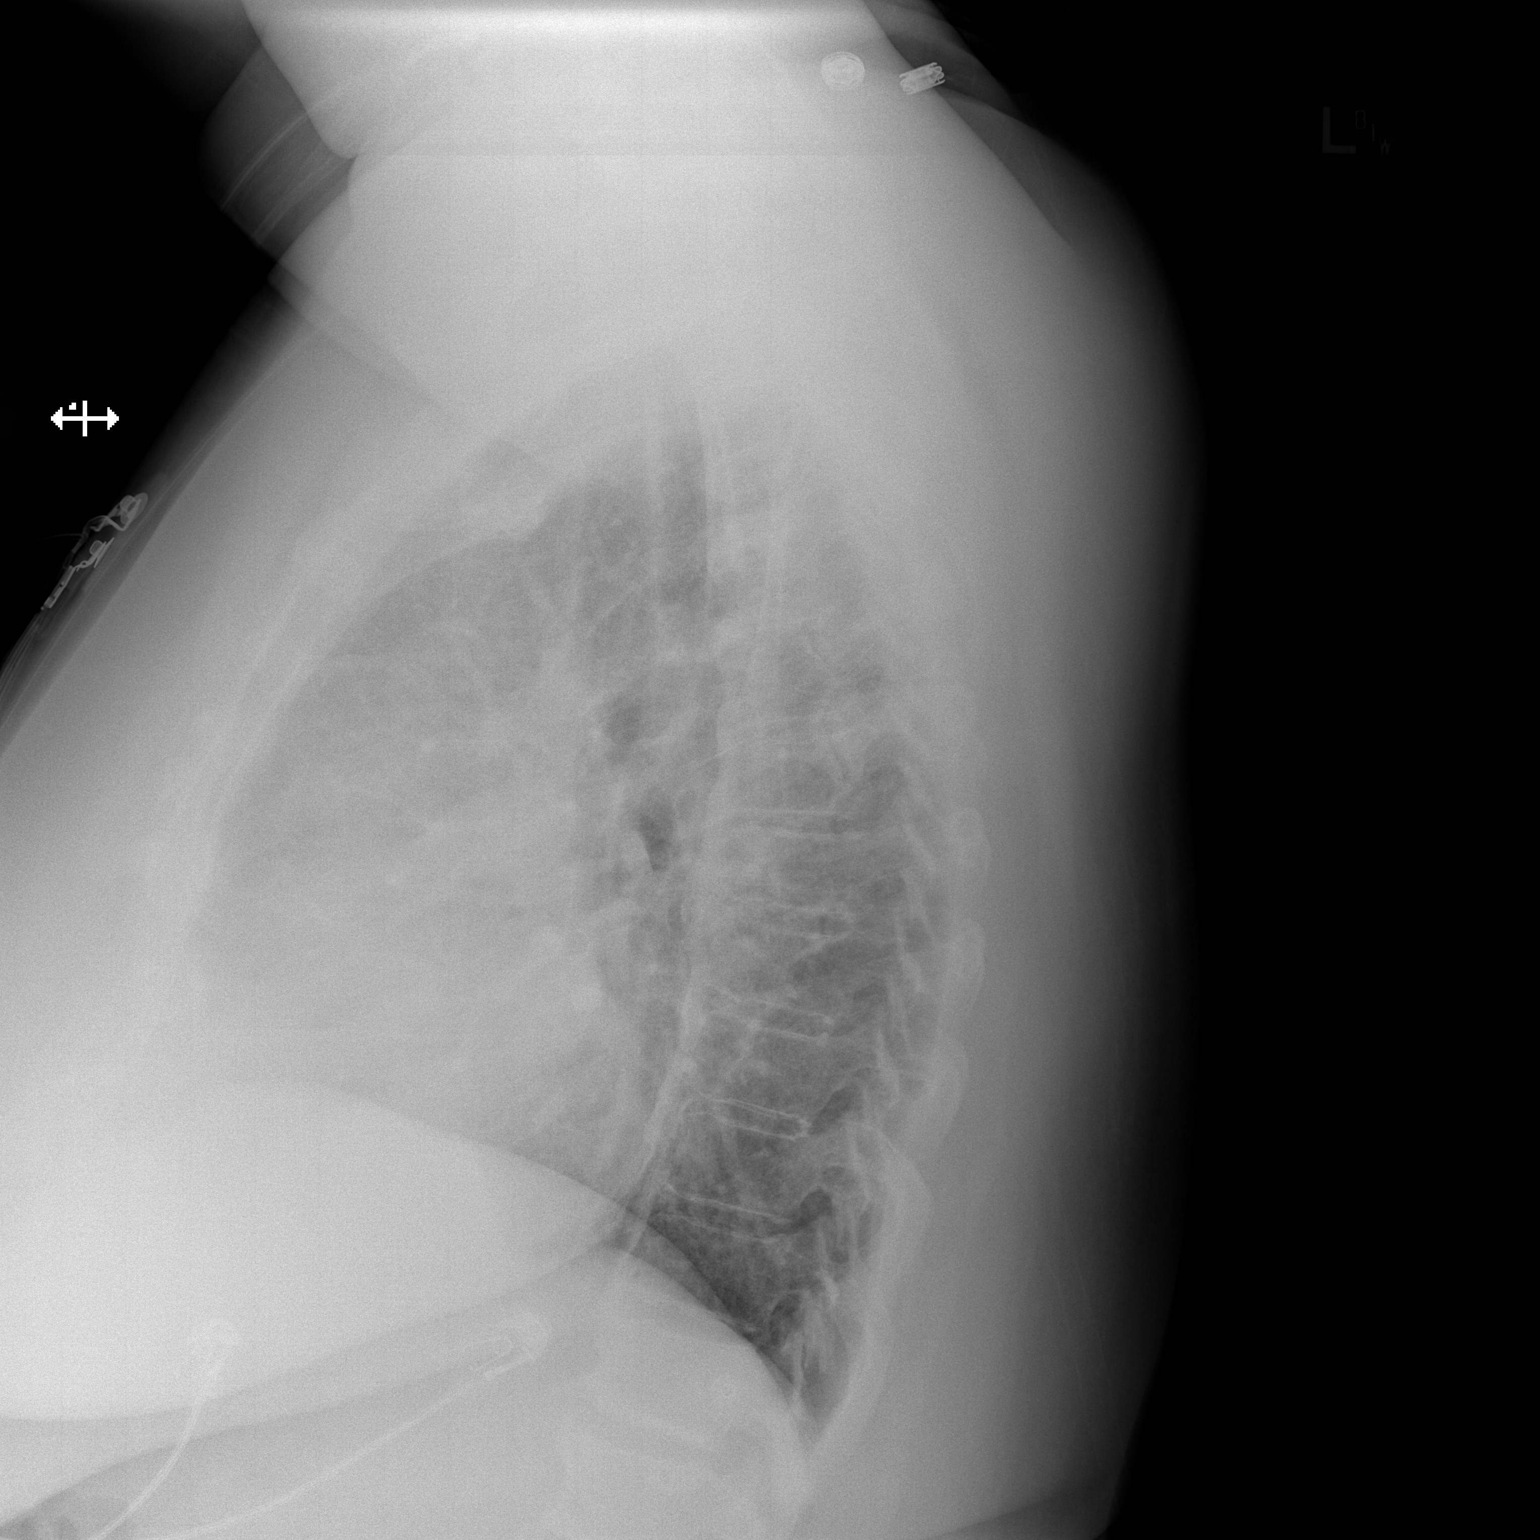

[2 of 2 positions shown; findings below may reference images not displayed]

FINDINGS: Normal mediastinum and cardiac silhouette. Normal pulmonary
vasculature. No evidence of effusion, infiltrate, or pneumothorax.
No acute bony abnormality.
IMPRESSION: No acute cardiopulmonary process.

## 2017-12-20 ENCOUNTER — Other Ambulatory Visit: Payer: Self-pay

## 2017-12-20 ENCOUNTER — Emergency Department (HOSPITAL_COMMUNITY)
Admission: EM | Admit: 2017-12-20 | Discharge: 2017-12-20 | Disposition: A | Discharge status: Home / Self Care | Payer: BC Managed Care – PPO | Attending: Emergency Medicine | Admitting: Emergency Medicine

## 2017-12-20 ENCOUNTER — Encounter (HOSPITAL_COMMUNITY): Payer: Self-pay | Admitting: *Deleted

## 2017-12-20 DIAGNOSIS — E119 Type 2 diabetes mellitus without complications: Secondary | ICD-10-CM | POA: Diagnosis not present

## 2017-12-20 DIAGNOSIS — J449 Chronic obstructive pulmonary disease, unspecified: Secondary | ICD-10-CM | POA: Diagnosis not present

## 2017-12-20 DIAGNOSIS — Z794 Long term (current) use of insulin: Secondary | ICD-10-CM | POA: Insufficient documentation

## 2017-12-20 DIAGNOSIS — Z79899 Other long term (current) drug therapy: Secondary | ICD-10-CM | POA: Insufficient documentation

## 2017-12-20 DIAGNOSIS — J45909 Unspecified asthma, uncomplicated: Secondary | ICD-10-CM | POA: Diagnosis not present

## 2017-12-20 DIAGNOSIS — I10 Essential (primary) hypertension: Secondary | ICD-10-CM | POA: Insufficient documentation

## 2017-12-20 DIAGNOSIS — K429 Umbilical hernia without obstruction or gangrene: Secondary | ICD-10-CM | POA: Diagnosis present

## 2017-12-20 MED ORDER — ACETAMINOPHEN 500 MG PO TABS
1000.0000 mg | ORAL_TABLET | Freq: Once | ORAL | Status: AC
  Administered 2017-12-20: 1000 mg via ORAL
  Filled 2017-12-20: qty 2

## 2017-12-20 MED ORDER — IBUPROFEN 800 MG PO TABS
800.0000 mg | ORAL_TABLET | Freq: Once | ORAL | Status: AC
  Administered 2017-12-20: 800 mg via ORAL
  Filled 2017-12-20: qty 1

## 2017-12-20 MED ORDER — MORPHINE SULFATE 15 MG PO TABS: 15.0000 mg | ORAL_TABLET | ORAL | 0 refills | Status: DC | PRN

## 2017-12-20 MED ORDER — OXYCODONE HCL 5 MG PO TABS
5.0000 mg | ORAL_TABLET | Freq: Once | ORAL | Status: AC
  Administered 2017-12-20: 5 mg via ORAL
  Filled 2017-12-20: qty 1

## 2017-12-20 NOTE — ED Notes (Signed)
Bed: WA14 Expected date:  Expected time:  Means of arrival:  Comments: 

## 2017-12-20 NOTE — Discharge Instructions (Signed)
Follow up with your General surgeon. Return if the pain gets worse, if you cannot put it back in on your own,  Take 4 over the counter ibuprofen tablets 3 times a day or 2 over-the-counter naproxen tablets twice a day for pain. Also take tylenol 1000mg (2 extra strength) four times a day.   Then take the pain medicine if you feel like you need it. Narcotics do not help with the pain, they only make you care about it less.  You can become addicted to this, people may break into your house to steal it.  It will constipate you.  If you drive under the influence of this medicine you can get a DUI.

## 2017-12-20 NOTE — ED Triage Notes (Signed)
Per EMS, pt has 3 inch abdominal hernia above umbilicus that is tender to palpation. Pt has had hernia for year but states pain suddenly became worse. Pt given 138mcg fentanyl en route. Pt had 2 aleve prior to arrival.  BP 174/104 HR 82 RR 18 O2 96% on RA CBG 142

## 2017-12-20 NOTE — ED Provider Notes (Signed)
[facility] DEPT Provider Note   CSN: [id number] Arrival date & time: 12/20/17  1810     History   Chief Complaint Chief Complaint  Patient presents with  . Hernia    HPI Sharon Swanson is a 57 y.o. female.  57 yo F with a chief complaint of a hernia.  She has had issues with this for the past 2 or 3 years.  Over the past month or so she feels that it is been stuck out.  Every time she stands up or bears down it seems to get bigger.  For the past 2 or 3 days it is gotten more painful.  Worsening this evening.  She denies vomiting denies constipation denies diarrhea.  Denies fevers or chills.  The history is provided by the patient.  Illness  This is a new problem. The current episode started more than 2 days ago. The problem occurs constantly. The problem has been gradually worsening. Associated symptoms include abdominal pain. Pertinent negatives include no chest pain, no headaches and no shortness of breath. Nothing aggravates the symptoms. Nothing relieves the symptoms. She has tried nothing for the symptoms. The treatment provided no relief.    Past Medical History:  Diagnosis Date  . Acid reflux   . Asthma   . COPD (chronic obstructive pulmonary disease) ([place])   . Fatty liver 11/08/2011  . Fibroids 11/08/2011  . Hypertension   . Obesity (BMI 30-39.9) 11/08/2011  . Pancreatitis 11/08/2011  . Ventral hernia     Patient Active Problem List   Diagnosis Date Noted  . Insulin-requiring or dependent type II diabetes mellitus ([place]) 01/19/2017  . Essential hypertension 01/19/2017  . Anxiety 01/19/2017  . Chest pain 01/19/2017  . OSA (obstructive sleep apnea) 01/19/2017  . COPD (chronic obstructive pulmonary disease) ([place])   . Acute respiratory failure ([place]) 08/17/2014  . Umbilical hernia 60/73/7106  . Pulmonary nodule, left 08/17/2014  . Constipation, chronic 08/15/2012  . GERD (gastroesophageal reflux disease) 11/08/2011  . Tobacco abuse  11/08/2011  . Fatty liver 11/08/2011  . BMI 45.0-49.9, adult ([place]) 11/08/2011  . Fibroids 11/08/2011    Past Surgical History:  Procedure Laterality Date  . ORTHOPEDIC SURGERY     R ankle, tendonitis     OB History    Gravida  0   Para  0   Term  0   Preterm  0   AB  0   Living  0     SAB  0   TAB  0   Ectopic  0   Multiple  0   Live Births               Home Medications    Prior to Admission medications   Medication Sig Start Date End Date Taking? Authorizing Provider  albuterol (PROVENTIL HFA;VENTOLIN HFA) 108 (90 Base) MCG/ACT inhaler Inhale 2 puffs into the lungs every 4 (four) hours as needed for wheezing or shortness of breath. 12/20/16   Mannam, Praveen, MD  albuterol (PROVENTIL) (2.5 MG/3ML) 0.083% nebulizer solution Take 3 mLs (2.5 mg total) by nebulization every 6 (six) hours as needed for wheezing or shortness of breath. 07/04/17   Varney Biles, MD  azithromycin (ZITHROMAX) 250 MG tablet Take 1 tablet (250 mg total) by mouth daily. Take first 2 tablets together, then 1 every day until finished. 07/04/17   Varney Biles, MD  fluticasone (FLONASE) 50 MCG/ACT nasal spray Place 1 spray into both nostrils daily. 06/11/16   Hollice Gong,  Mir Mohammed, MD  Fluticasone-Salmeterol (ADVAIR DISKUS) 250-50 MCG/DOSE AEPB Inhale 1 puff into the lungs 2 (two) times daily. 12/20/16   Mannam, Hart Robinsons, MD  glucose monitoring kit (FREESTYLE) monitoring kit 1 each by Does not apply route as needed for other. 06/10/16   Hollice Gong, Mir Earlie Server, MD  guaiFENesin (ROBITUSSIN) 100 MG/5ML SOLN Take 15 mLs (300 mg total) by mouth every 6 (six) hours as needed for cough or to loosen phlegm. 01/20/17   Johnson, Clanford L, MD  hydrochlorothiazide (MICROZIDE) 12.5 MG capsule Take 1 capsule (12.5 mg total) by mouth daily. 06/10/16   Hollice Gong, Mir Mohammed, MD  HYDROcodone-homatropine [facility]) 5-1.5 MG/5ML syrup Take 5 mLs by mouth every 6 (six) hours as needed for cough. 01/14/17    Charlesetta Shanks, MD  insulin aspart (NOVOLOG) 100 UNIT/ML injection Inject 5 Units into the skin 3 (three) times daily with meals. 06/10/16   Hollice Gong, Mir Mohammed, MD  insulin glargine (LANTUS) 100 UNIT/ML injection Inject 0.12 mLs (12 Units total) into the skin daily. 06/11/16   Hollice Gong, Mir Mohammed, MD  ipratropium (ATROVENT) 0.02 % nebulizer solution Take 2.5 mLs (0.5 mg total) by nebulization 4 (four) times daily. 01/14/17   Charlesetta Shanks, MD  ipratropium-albuterol (DUONEB) 0.5-2.5 (3) MG/3ML SOLN Take 3 mLs by nebulization every 6 (six) hours. 08/22/14   Hosie Poisson, MD  meclizine (ANTIVERT) 25 MG tablet Take 1 tablet (25 mg total) by mouth 3 (three) times daily as needed for dizziness. 08/05/16   Leo Grosser, MD  metFORMIN (GLUCOPHAGE) 500 MG tablet Take 500 mg by mouth 2 (two) times daily with a meal. 04/27/16   [provider]  mometasone-formoterol (DULERA) 100-5 MCG/ACT AERO Inhale 2 puffs into the lungs 2 (two) times daily. 07/04/17   Varney Biles, MD  morphine (MSIR) 15 MG tablet Take 1 tablet (15 mg total) by mouth every 4 (four) hours as needed for severe pain. 12/20/17   Deno Etienne, DO  pravastatin (PRAVACHOL) 40 MG tablet Take 40 mg by mouth daily. 04/27/16 04/27/17  [provider]  predniSONE (DELTASONE) 10 MG tablet Take 6 tablets (60 mg total) by mouth daily. 07/04/17   Varney Biles, MD  ranitidine (ZANTAC) 150 MG tablet Take 150 mg by mouth 2 (two) times daily. 04/25/16 04/25/17  [provider]  verapamil (CALAN-SR) 240 MG CR tablet Take 240 mg by mouth daily. 06/02/16   [provider]    Family History Family History  Problem Relation Age of Onset  . Hypertension Mother   . Diabetes Other     Social History Social History   Tobacco Use  . Smoking status: Current Every Day Smoker    Packs/day: 0.50    Types: Cigarettes  . Smokeless tobacco: Never Used  Substance Use Topics  . Alcohol use: No  . Drug use: No      Allergies   Aspirin   Review of Systems Review of Systems  Constitutional: Negative for chills and fever.  HENT: Negative for congestion and rhinorrhea.   Eyes: Negative for redness and visual disturbance.  Respiratory: Negative for shortness of breath and wheezing.   Cardiovascular: Negative for chest pain and palpitations.  Gastrointestinal: Positive for abdominal pain. Negative for nausea and vomiting.  Genitourinary: Negative for dysuria and urgency.  Musculoskeletal: Negative for arthralgias and myalgias.  Skin: Negative for pallor and wound.  Neurological: Negative for dizziness and headaches.     Physical Exam Updated Vital Signs BP (!) 187/95 (BP Location: Left Arm)   Pulse 89  Temp 98.4 F (36.9 C) (Oral)   Resp 18   SpO2 95%   Physical Exam  Constitutional: She is oriented to person, place, and time. She appears well-developed and well-nourished. No distress.  HENT:  Head: Normocephalic and atraumatic.  Eyes: Pupils are equal, round, and reactive to light. EOM are normal.  Neck: Normal range of motion. Neck supple.  Cardiovascular: Normal rate and regular rhythm. Exam reveals no gallop and no friction rub.  No murmur heard. Pulmonary/Chest: Effort normal. She has no wheezes. She has no rales.  Abdominal: Soft. She exhibits no distension. There is no tenderness.  Periumbilical hernia tender to palpation  Musculoskeletal: She exhibits no edema or tenderness.  Neurological: She is alert and oriented to person, place, and time.  Skin: Skin is warm and dry. She is not diaphoretic.  Psychiatric: She has a normal mood and affect. Her behavior is normal.  Nursing note and vitals reviewed.    ED Treatments / Results  Labs (all labs ordered are listed, but only abnormal results are displayed) Labs Reviewed - No data to display  EKG None  Radiology No results found.  Procedures Hernia reduction Date/Time: 12/20/2017 7:51 PM Performed by: Deno Etienne,  DO Authorized by: Deno Etienne, DO  Consent: Verbal consent obtained. Risks and benefits: risks, benefits and alternatives were discussed Consent given by: patient Patient identity confirmed: verbally with patient Local anesthesia used: no  Anesthesia: Local anesthesia used: no  Sedation: Patient sedated: no  Patient tolerance: Patient tolerated the procedure well with no immediate complications    (including critical care time)  Medications Ordered in ED Medications  acetaminophen (TYLENOL) tablet 1,000 mg (1,000 mg Oral Given 12/20/17 1934)  oxyCODONE (Oxy IR/ROXICODONE) immediate release tablet 5 mg (5 mg Oral Given 12/20/17 1934)  ibuprofen (ADVIL,MOTRIN) tablet 800 mg (800 mg Oral Given 12/20/17 1934)     Initial Impression / Assessment and Plan / ED Course  I have reviewed the triage vital signs and the nursing notes.  Pertinent labs & imaging results that were available during my care of the patient were reviewed by me and considered in my medical decision making (see chart for details).     57 yo F with a chief complaint of a painful umbilical hernia.  This was reduced with direct pressure at bedside.  Patient with some significant improvement of her pain.  We will give a short course of pain medicine.  Have her follow-up with a general surgeon.  Discharge home.  7:53 PM:  I have discussed the diagnosis/risks/treatment options with the patient and believe the pt to be eligible for discharge home to follow-up with PCP, Gen surgery. We also discussed returning to the ED immediately if new or worsening sx occur. We discussed the sx which are most concerning (e.g., sudden worsening pain, fever, inability to tolerate by mouth) that necessitate immediate return. Medications administered to the patient during their visit and any new prescriptions provided to the patient are listed below.  Medications given during this visit Medications  acetaminophen (TYLENOL) tablet 1,000 mg  (1,000 mg Oral Given 12/20/17 1934)  oxyCODONE (Oxy IR/ROXICODONE) immediate release tablet 5 mg (5 mg Oral Given 12/20/17 1934)  ibuprofen (ADVIL,MOTRIN) tablet 800 mg (800 mg Oral Given 12/20/17 1934)     The patient appears reasonably screen and/or stabilized for discharge and I doubt any other medical condition or other [facility] requiring further screening, evaluation, or treatment in the ED at this time prior to discharge.    Final  Clinical Impressions(s) / ED Diagnoses   Final diagnoses:  Umbilical hernia without obstruction and without gangrene    ED Discharge Orders        Ordered    morphine (MSIR) 15 MG tablet  Every 4 hours PRN     12/20/17 1918       Deno Etienne, DO 12/20/17 1953

## 2017-12-20 NOTE — ED Notes (Signed)
ED Provider at bedside. 

## 2017-12-26 ENCOUNTER — Other Ambulatory Visit: Payer: Self-pay | Admitting: Family Medicine

## 2017-12-26 ENCOUNTER — Ambulatory Visit: Payer: Self-pay

## 2017-12-26 DIAGNOSIS — M25511 Pain in right shoulder: Secondary | ICD-10-CM

## 2018-03-01 ENCOUNTER — Other Ambulatory Visit: Payer: Self-pay | Admitting: Orthopedic Surgery

## 2018-03-06 NOTE — Pre-Procedure Instructions (Signed)
Eyvonne Burchfield  03/06/2018      [id number] [address], [place] [address] Phone: [phone] Fax: [phone]  [id number] - [address], [place] [address] Phone: [phone] Fax: [phone]    Your procedure is scheduled on Mon., March 11, 2018 from 1:15PM-3:00PM  Report to [facility] Admitting Entrance "A" at 11:15AM  Call this number if you have problems the morning of surgery:  [phone]   Remember:  Do not eat or drink after midnight on June 16th    Take these medicines the morning of surgery with A SIP OF WATER: Ranitidine (ZANTAC), Verapamil (CALAN-SR), and Budesonide-formoterol (SYMBICORT).  If needed Fluticasone (FLONASE),  Ipratropium (ATROVENT) Neb, and Albuterol Neb/Inhaler (Bring inhaler with you the day of surgery).  As of today, stop taking all Other Aspirin Products, Vitamins, Fish oils, and Herbal medications. Also stop all NSAIDS i.e. Advil, Ibuprofen, Motrin, Aleve, Anaprox, Naproxen, BC, Goody Powders, and all Supplements.  How to Manage Your Diabetes Before and After Surgery  Why is it important to control my blood sugar before and after surgery? . Improving blood sugar levels before and after surgery helps healing and can limit problems. . A way of improving blood sugar control is eating a healthy diet by: o  Eating less sugar and carbohydrates o  Increasing activity/exercise o  Talking with your doctor about reaching your blood sugar goals . High blood sugars (greater than 180 mg/dL) can raise your risk of infections and slow your recovery, so you will need to focus on controlling your diabetes during the weeks before surgery. . Make sure that the doctor who takes care of your diabetes knows about your planned surgery including the date and location.  How do I  manage my blood sugar before surgery? . Check your blood sugar at least 4 times a day, starting 2 days before surgery, to make sure that the level is not too high or low. o Check your blood sugar the morning of your surgery when you wake up and every 2 hours until you get to the Short Stay unit. . If your blood sugar is less than 70 mg/dL, you will need to treat for low blood sugar: o Do not take insulin. o Treat a low blood sugar (less than 70 mg/dL) with  cup of clear juice (cranberry or apple), 4 glucose tablets, OR glucose gel. Recheck blood sugar in 15 minutes after treatment (to make sure it is greater than 70 mg/dL). If your blood sugar is not greater than 70 mg/dL on recheck, call [phone] o  for further instructions. . Report your blood sugar to the short stay nurse when you get to Short Stay.  . If you are admitted to the hospital after surgery: o Your blood sugar will be checked by the staff and you will probably be given insulin after surgery (instead of oral diabetes medicines) to make sure you have good blood sugar levels. o The goal for blood sugar control after surgery is 80-180 mg/dL.  WHAT DO I DO ABOUT MY DIABETES MEDICATION?  Marland Kitchen Do not take MetFORMIN (GLUCOPHAGE) the morning of surgery.  . THE NIGHT BEFORE SURGERY, take ___6________ units of ______Lantus_____insulin.  . Do not take Vicotza the morning of surgery     . If your CBG is greater than 220 mg/dL, you may take  of your sliding scale (correction) dose of insulin.  Reviewed and Endorsed by [facility] Patient Education Committee, August 2015    Do not wear jewelry, make-up or nail polish (fingers).  Do not wear lotions, powders, or perfumes, or deodorant.  Do not shave 48 hours prior to surgery.    Do not bring valuables to the hospital.  [facility] is not responsible for any belongings or valuables.  Contacts, dentures or bridgework may not be worn into surgery.  Leave your suitcase in the car.  After  surgery it may be brought to your room.  For patients admitted to the hospital, discharge time will be determined by your treatment team.  Patients discharged the day of surgery will not be allowed to drive home.   Special instructions:   [facility]- Preparing For Surgery  Before surgery, you can play an important role. Because skin is not sterile, your skin needs to be as free of germs as possible. You can reduce the number of germs on your skin by washing with CHG (chlorahexidine gluconate) Soap before surgery.  CHG is an antiseptic cleaner which kills germs and bonds with the skin to continue killing germs even after washing.    Oral Hygiene is also important to reduce your risk of infection.  Remember - BRUSH YOUR TEETH THE MORNING OF SURGERY WITH YOUR REGULAR TOOTHPASTE  Please do not use if you have an allergy to CHG or antibacterial soaps. If your skin becomes reddened/irritated stop using the CHG.  Do not shave (including legs and underarms) for at least 48 hours prior to first CHG shower. It is OK to shave your face.  Please follow these instructions carefully.   1. Shower the NIGHT BEFORE SURGERY and the MORNING OF SURGERY with CHG.   2. If you chose to wash your hair, wash your hair first as usual with your normal shampoo.  3. After you shampoo, rinse your hair and body thoroughly to remove the shampoo.  4. Use CHG as you would any other liquid soap. You can apply CHG directly to the skin and wash gently with a scrungie or a clean washcloth.   5. Apply the CHG Soap to your body ONLY FROM THE NECK DOWN.  Do not use on open wounds or open sores. Avoid contact with your eyes, ears, mouth and genitals (private parts). Wash Face and genitals (private parts)  with your normal soap.  6. Wash thoroughly, paying special attention to the area where your surgery will be performed.  7. Thoroughly rinse your body with warm water from the neck down.  8. DO NOT shower/wash with your  normal soap after using and rinsing off the CHG Soap.  9. Pat yourself dry with a CLEAN TOWEL.  10. Wear CLEAN PAJAMAS to bed the night before surgery, wear comfortable clothes the morning of surgery  11. Place CLEAN SHEETS on your bed the night of your first shower and DO NOT SLEEP WITH PETS.  Day of Surgery:  Do not apply any deodorants/lotions.  Please wear clean clothes to the hospital/surgery center.   Remember to brush your teeth WITH YOUR REGULAR TOOTHPASTE.  Please read over the following fact sheets that you were given. Pain Booklet, Coughing and Deep Breathing and Surgical Site Infection Prevention

## 2018-03-07 ENCOUNTER — Other Ambulatory Visit: Payer: Self-pay

## 2018-03-07 ENCOUNTER — Encounter (HOSPITAL_COMMUNITY)
Admission: RE | Admit: 2018-03-07 | Discharge: 2018-03-07 | Disposition: A | Discharge status: Home / Self Care | Payer: BC Managed Care – PPO | Source: Ambulatory Visit | Attending: Orthopedic Surgery | Admitting: Orthopedic Surgery

## 2018-03-07 ENCOUNTER — Encounter (HOSPITAL_COMMUNITY): Payer: Self-pay

## 2018-03-07 DIAGNOSIS — Z01812 Encounter for preprocedural laboratory examination: Secondary | ICD-10-CM | POA: Insufficient documentation

## 2018-03-07 HISTORY — DX: Primary osteoarthritis, right shoulder: M19.011

## 2018-03-07 HISTORY — DX: Sleep apnea, unspecified: G47.30

## 2018-03-07 HISTORY — DX: Type 2 diabetes mellitus without complications: E11.9

## 2018-03-07 HISTORY — DX: Unspecified rotator cuff tear or rupture of right shoulder, not specified as traumatic: M75.101

## 2018-03-07 LAB — SURGICAL PCR SCREEN
MRSA, PCR: NEGATIVE
Staphylococcus aureus: NEGATIVE

## 2018-03-07 LAB — GLUCOSE, CAPILLARY: Glucose-Capillary: 99 mg/dL (ref 65–99)

## 2018-03-07 NOTE — Progress Notes (Signed)
PCP - Dr. Kallie Locks  Cardiologist - Denies  Chest x-ray - 07/04/17 (E)  EKG - 03/07/18  Stress Test - 04/28/16 (CE)  ECHO - 06/05/16 (CE)  Cardiac Cath - Denies  Sleep Study - Yes-Positive CPAP - None- Could not afford machine  LABS- 03/04/18: CBC w/D, CMP (CE)  ASA- Denies  HA1C- 03/04/18: 6.9 (CE) Fasting Blood Sugar - 90's, Today 99 Checks Blood Sugar ___1__ times a day  Anesthesia- No  Pt denies having chest pain, sob, or fever at this time. All instructions explained to the pt, with a verbal understanding of the material. Pt agrees to go over the instructions while at home for a better understanding. The opportunity to ask questions was provided.

## 2018-03-08 MED ORDER — DEXTROSE 5 % IV SOLN
3.0000 g | INTRAVENOUS | Status: DC
  Filled 2018-03-08: qty 3000

## 2018-03-11 ENCOUNTER — Encounter (HOSPITAL_COMMUNITY)
Admission: RE | Disposition: A | Discharge status: Home / Self Care | Payer: Self-pay | Source: Ambulatory Visit | Attending: Orthopedic Surgery

## 2018-03-11 ENCOUNTER — Encounter (HOSPITAL_COMMUNITY): Payer: Self-pay

## 2018-03-11 ENCOUNTER — Encounter (HOSPITAL_COMMUNITY): Payer: Self-pay | Admitting: Certified Registered"

## 2018-03-11 ENCOUNTER — Ambulatory Visit (HOSPITAL_COMMUNITY)
Admission: RE | Admit: 2018-03-11 | Discharge: 2018-03-11 | Disposition: A | Discharge status: Home / Self Care | Payer: Worker's Compensation | Source: Ambulatory Visit | Attending: Orthopedic Surgery | Admitting: Orthopedic Surgery

## 2018-03-11 DIAGNOSIS — R030 Elevated blood-pressure reading, without diagnosis of hypertension: Secondary | ICD-10-CM | POA: Diagnosis not present

## 2018-03-11 DIAGNOSIS — M75101 Unspecified rotator cuff tear or rupture of right shoulder, not specified as traumatic: Secondary | ICD-10-CM | POA: Diagnosis present

## 2018-03-11 DIAGNOSIS — M19011 Primary osteoarthritis, right shoulder: Secondary | ICD-10-CM | POA: Insufficient documentation

## 2018-03-11 DIAGNOSIS — Z5309 Procedure and treatment not carried out because of other contraindication: Secondary | ICD-10-CM | POA: Insufficient documentation

## 2018-03-11 LAB — GLUCOSE, CAPILLARY: GLUCOSE-CAPILLARY: 105 mg/dL — AB (ref 65–99)

## 2018-03-11 SURGERY — SHOULDER ARTHROSCOPY WITH SUBACROMIAL DECOMPRESSION, ROTATOR CUFF REPAIR AND BICEP TENDON REPAIR
Anesthesia: Choice | Laterality: Right

## 2018-03-11 MED ORDER — MIDAZOLAM HCL 2 MG/2ML IJ SOLN
2.0000 mg | Freq: Once | INTRAMUSCULAR | Status: DC
  Filled 2018-03-11: qty 2

## 2018-03-11 MED ORDER — POVIDONE-IODINE 7.5 % EX SOLN: Freq: Once | CUTANEOUS | Status: DC

## 2018-03-11 MED ORDER — FENTANYL CITRATE (PF) 100 MCG/2ML IJ SOLN
100.0000 ug | Freq: Once | INTRAMUSCULAR | Status: DC
  Filled 2018-03-11: qty 2

## 2018-03-11 NOTE — Anesthesia Preprocedure Evaluation (Deleted)
Anesthesia Evaluation    Airway        Dental   Pulmonary asthma , COPD, Current Smoker,           Cardiovascular hypertension, Pt. on medications   Took meds as scheduled. Diastolics still [phone] !! Case cancelled for HTN. Needs to see PCP and meds adjusted prior to arthroscopic shoulder surgery. High risk for stroke.   Neuro/Psych    GI/Hepatic   Endo/Other  diabetes, Type 2, Insulin DependentMorbid obesity  Renal/GU      Musculoskeletal   Abdominal   Peds  Hematology   Anesthesia Other Findings   Reproductive/Obstetrics                            Anesthesia Physical Anesthesia Plan Anesthesia Quick Evaluation

## 2018-03-11 NOTE — Progress Notes (Signed)
Patient's blood pressure elevated at 191/119, 179/110 and 177/120.  Dr. Marcell Barlow informed and after discussing with Dr. Tamera Punt and the patient, the patient's surgery was cancelled and she was advised to make an appointment with her primary care physician to address elevated blood pressure

## 2018-03-18 DIAGNOSIS — E7800 Pure hypercholesterolemia, unspecified: Secondary | ICD-10-CM | POA: Insufficient documentation

## 2018-03-27 ENCOUNTER — Other Ambulatory Visit: Payer: Self-pay | Admitting: Orthopedic Surgery

## 2018-04-08 MED ORDER — DEXTROSE 5 % IV SOLN
3.0000 g | INTRAVENOUS | Status: AC
  Administered 2018-04-09: 3 g via INTRAVENOUS
  Filled 2018-04-08: qty 3

## 2018-04-09 ENCOUNTER — Ambulatory Visit (HOSPITAL_COMMUNITY): Payer: Worker's Compensation | Admitting: Certified Registered Nurse Anesthetist

## 2018-04-09 ENCOUNTER — Encounter (HOSPITAL_COMMUNITY)
Admission: RE | Disposition: A | Discharge status: Home / Self Care | Payer: Self-pay | Source: Ambulatory Visit | Attending: Orthopedic Surgery

## 2018-04-09 ENCOUNTER — Encounter (HOSPITAL_COMMUNITY): Payer: Self-pay | Admitting: Anesthesiology

## 2018-04-09 ENCOUNTER — Ambulatory Visit (HOSPITAL_COMMUNITY)
Admission: RE | Admit: 2018-04-09 | Discharge: 2018-04-09 | Disposition: A | Discharge status: Home / Self Care | Payer: Worker's Compensation | Source: Ambulatory Visit | Attending: Orthopedic Surgery | Admitting: Orthopedic Surgery

## 2018-04-09 DIAGNOSIS — Z7982 Long term (current) use of aspirin: Secondary | ICD-10-CM | POA: Insufficient documentation

## 2018-04-09 DIAGNOSIS — Z6841 Body Mass Index (BMI) 40.0 and over, adult: Secondary | ICD-10-CM | POA: Diagnosis not present

## 2018-04-09 DIAGNOSIS — Y939 Activity, unspecified: Secondary | ICD-10-CM | POA: Diagnosis not present

## 2018-04-09 DIAGNOSIS — Y9289 Other specified places as the place of occurrence of the external cause: Secondary | ICD-10-CM | POA: Diagnosis not present

## 2018-04-09 DIAGNOSIS — G4730 Sleep apnea, unspecified: Secondary | ICD-10-CM | POA: Insufficient documentation

## 2018-04-09 DIAGNOSIS — M19011 Primary osteoarthritis, right shoulder: Secondary | ICD-10-CM | POA: Insufficient documentation

## 2018-04-09 DIAGNOSIS — Z79899 Other long term (current) drug therapy: Secondary | ICD-10-CM | POA: Insufficient documentation

## 2018-04-09 DIAGNOSIS — J449 Chronic obstructive pulmonary disease, unspecified: Secondary | ICD-10-CM | POA: Diagnosis not present

## 2018-04-09 DIAGNOSIS — S46011A Strain of muscle(s) and tendon(s) of the rotator cuff of right shoulder, initial encounter: Secondary | ICD-10-CM | POA: Insufficient documentation

## 2018-04-09 DIAGNOSIS — J45909 Unspecified asthma, uncomplicated: Secondary | ICD-10-CM | POA: Insufficient documentation

## 2018-04-09 DIAGNOSIS — E119 Type 2 diabetes mellitus without complications: Secondary | ICD-10-CM | POA: Diagnosis not present

## 2018-04-09 DIAGNOSIS — Z833 Family history of diabetes mellitus: Secondary | ICD-10-CM | POA: Insufficient documentation

## 2018-04-09 DIAGNOSIS — M24811 Other specific joint derangements of right shoulder, not elsewhere classified: Secondary | ICD-10-CM | POA: Insufficient documentation

## 2018-04-09 DIAGNOSIS — M7541 Impingement syndrome of right shoulder: Secondary | ICD-10-CM | POA: Diagnosis not present

## 2018-04-09 DIAGNOSIS — F17200 Nicotine dependence, unspecified, uncomplicated: Secondary | ICD-10-CM | POA: Diagnosis not present

## 2018-04-09 DIAGNOSIS — I10 Essential (primary) hypertension: Secondary | ICD-10-CM | POA: Insufficient documentation

## 2018-04-09 DIAGNOSIS — K760 Fatty (change of) liver, not elsewhere classified: Secondary | ICD-10-CM | POA: Insufficient documentation

## 2018-04-09 DIAGNOSIS — K219 Gastro-esophageal reflux disease without esophagitis: Secondary | ICD-10-CM | POA: Insufficient documentation

## 2018-04-09 DIAGNOSIS — Z7951 Long term (current) use of inhaled steroids: Secondary | ICD-10-CM | POA: Insufficient documentation

## 2018-04-09 DIAGNOSIS — Z886 Allergy status to analgesic agent status: Secondary | ICD-10-CM | POA: Insufficient documentation

## 2018-04-09 DIAGNOSIS — Y990 Civilian activity done for income or pay: Secondary | ICD-10-CM | POA: Diagnosis not present

## 2018-04-09 DIAGNOSIS — M75101 Unspecified rotator cuff tear or rupture of right shoulder, not specified as traumatic: Secondary | ICD-10-CM | POA: Diagnosis present

## 2018-04-09 DIAGNOSIS — X58XXXA Exposure to other specified factors, initial encounter: Secondary | ICD-10-CM | POA: Insufficient documentation

## 2018-04-09 DIAGNOSIS — Z8249 Family history of ischemic heart disease and other diseases of the circulatory system: Secondary | ICD-10-CM | POA: Insufficient documentation

## 2018-04-09 LAB — BASIC METABOLIC PANEL
Anion gap: 10 (ref 5–15)
BUN: 19 mg/dL (ref 6–20)
CALCIUM: 9.6 mg/dL (ref 8.9–10.3)
CO2: 29 mmol/L (ref 22–32)
CREATININE: 0.71 mg/dL (ref 0.44–1.00)
Chloride: 103 mmol/L (ref 98–111)
GFR calc non Af Amer: >60 mL/min (ref >60)
Glucose, Bld: 144 mg/dL — ABNORMAL HIGH (ref 70–99)
Potassium: 4.3 mmol/L (ref 3.5–5.1)
SODIUM: 142 mmol/L (ref 135–145)

## 2018-04-09 LAB — CBC
HCT: 50.5 % — ABNORMAL HIGH (ref 36.0–46.0)
Hemoglobin: 15.4 g/dL — ABNORMAL HIGH (ref 12.0–15.0)
MCH: 27.7 pg (ref 26.0–34.0)
MCHC: 30.5 g/dL (ref 30.0–36.0)
MCV: 91 fL (ref 78.0–100.0)
Platelets: 187 10*3/uL (ref 150–400)
RBC: 5.55 MIL/uL — ABNORMAL HIGH (ref 3.87–5.11)
RDW: 14.4 % (ref 11.5–15.5)
WBC: 4.5 10*3/uL (ref 4.0–10.5)

## 2018-04-09 LAB — GLUCOSE, CAPILLARY
GLUCOSE-CAPILLARY: 146 mg/dL — AB (ref 70–99)
GLUCOSE-CAPILLARY: 145 mg/dL — AB (ref 70–99)

## 2018-04-09 SURGERY — SHOULDER ARTHROSCOPY WITH SUBACROMIAL DECOMPRESSION AND DISTAL CLAVICLE EXCISION
Anesthesia: General | Site: Shoulder | Laterality: Right

## 2018-04-09 MED ORDER — FENTANYL CITRATE (PF) 100 MCG/2ML IJ SOLN
INTRAMUSCULAR | Status: AC
  Filled 2018-04-09: qty 2

## 2018-04-09 MED ORDER — POVIDONE-IODINE 7.5 % EX SOLN
Freq: Once | CUTANEOUS | Status: DC
  Filled 2018-04-09: qty 118

## 2018-04-09 MED ORDER — METHOCARBAMOL 500 MG PO TABS: 500.0000 mg | ORAL_TABLET | Freq: Three times a day (TID) | ORAL | 0 refills | Status: DC

## 2018-04-09 MED ORDER — ONDANSETRON HCL 4 MG/2ML IJ SOLN
INTRAMUSCULAR | Status: AC
  Filled 2018-04-09: qty 2

## 2018-04-09 MED ORDER — PHENYLEPHRINE 40 MCG/ML (10ML) SYRINGE FOR IV PUSH (FOR BLOOD PRESSURE SUPPORT)
PREFILLED_SYRINGE | INTRAVENOUS | Status: AC
  Filled 2018-04-09: qty 10

## 2018-04-09 MED ORDER — ALBUTEROL SULFATE HFA 108 (90 BASE) MCG/ACT IN AERS
INHALATION_SPRAY | RESPIRATORY_TRACT | Status: AC
  Filled 2018-04-09: qty 6.7

## 2018-04-09 MED ORDER — METOCLOPRAMIDE HCL 5 MG/ML IJ SOLN: 10.0000 mg | Freq: Once | INTRAMUSCULAR | Status: DC | PRN

## 2018-04-09 MED ORDER — ALBUTEROL SULFATE (2.5 MG/3ML) 0.083% IN NEBU
INHALATION_SOLUTION | RESPIRATORY_TRACT | Status: AC
  Filled 2018-04-09: qty 3

## 2018-04-09 MED ORDER — DEXAMETHASONE SODIUM PHOSPHATE 10 MG/ML IJ SOLN
INTRAMUSCULAR | Status: DC | PRN
  Administered 2018-04-09: 8 mg via INTRAVENOUS

## 2018-04-09 MED ORDER — FENTANYL CITRATE (PF) 100 MCG/2ML IJ SOLN
INTRAMUSCULAR | Status: DC | PRN
  Administered 2018-04-09: 100 ug via INTRAVENOUS
  Administered 2018-04-09: 25 ug via INTRAVENOUS

## 2018-04-09 MED ORDER — 0.9 % SODIUM CHLORIDE (POUR BTL) OPTIME
TOPICAL | Status: DC | PRN
  Administered 2018-04-09: 1000 mL

## 2018-04-09 MED ORDER — PROPOFOL 10 MG/ML IV BOLUS
INTRAVENOUS | Status: AC
  Filled 2018-04-09: qty 20

## 2018-04-09 MED ORDER — OXYCODONE-ACETAMINOPHEN 5-325 MG PO TABS: 1.0000 | ORAL_TABLET | ORAL | 0 refills | Status: DC | PRN

## 2018-04-09 MED ORDER — LACTATED RINGERS IV SOLN
INTRAVENOUS | Status: DC | PRN
  Administered 2018-04-09: 07:00:00 via INTRAVENOUS

## 2018-04-09 MED ORDER — PROPOFOL 10 MG/ML IV BOLUS
INTRAVENOUS | Status: DC | PRN
  Administered 2018-04-09: 200 mg via INTRAVENOUS
  Administered 2018-04-09: 50 mg via INTRAVENOUS

## 2018-04-09 MED ORDER — SUGAMMADEX SODIUM 500 MG/5ML IV SOLN
INTRAVENOUS | Status: DC | PRN
  Administered 2018-04-09: 400 mg via INTRAVENOUS

## 2018-04-09 MED ORDER — ALBUTEROL SULFATE HFA 108 (90 BASE) MCG/ACT IN AERS
INHALATION_SPRAY | RESPIRATORY_TRACT | Status: DC | PRN
  Administered 2018-04-09 (×2): 6 via RESPIRATORY_TRACT

## 2018-04-09 MED ORDER — MIDAZOLAM HCL 2 MG/2ML IJ SOLN
INTRAMUSCULAR | Status: AC
  Filled 2018-04-09: qty 2

## 2018-04-09 MED ORDER — LIDOCAINE 2% (20 MG/ML) 5 ML SYRINGE
INTRAMUSCULAR | Status: AC
  Filled 2018-04-09: qty 5

## 2018-04-09 MED ORDER — ALBUTEROL SULFATE (2.5 MG/3ML) 0.083% IN NEBU
2.5000 mg | INHALATION_SOLUTION | Freq: Once | RESPIRATORY_TRACT | Status: AC
  Administered 2018-04-09: 2.5 mg via RESPIRATORY_TRACT

## 2018-04-09 MED ORDER — FENTANYL CITRATE (PF) 100 MCG/2ML IJ SOLN
25.0000 ug | INTRAMUSCULAR | Status: DC | PRN
  Administered 2018-04-09 (×2): 25 ug via INTRAVENOUS

## 2018-04-09 MED ORDER — ROCURONIUM BROMIDE 100 MG/10ML IV SOLN
INTRAVENOUS | Status: DC | PRN
  Administered 2018-04-09: 80 mg via INTRAVENOUS

## 2018-04-09 MED ORDER — ONDANSETRON HCL 4 MG/2ML IJ SOLN
INTRAMUSCULAR | Status: DC | PRN
  Administered 2018-04-09: 4 mg via INTRAVENOUS

## 2018-04-09 MED ORDER — BUPIVACAINE-EPINEPHRINE (PF) 0.25% -1:200000 IJ SOLN
INTRAMUSCULAR | Status: AC
  Filled 2018-04-09: qty 30

## 2018-04-09 MED ORDER — SODIUM CHLORIDE 0.9 % IR SOLN
Status: DC | PRN
  Administered 2018-04-09 (×2): 3000 mL

## 2018-04-09 MED ORDER — PHENYLEPHRINE HCL 10 MG/ML IJ SOLN
INTRAMUSCULAR | Status: DC | PRN
  Administered 2018-04-09: 80 ug via INTRAVENOUS

## 2018-04-09 MED ORDER — SODIUM CHLORIDE 0.9 % IV SOLN
INTRAVENOUS | Status: DC | PRN
  Administered 2018-04-09: 40 ug/min via INTRAVENOUS

## 2018-04-09 MED ORDER — ALBUTEROL SULFATE (2.5 MG/3ML) 0.083% IN NEBU: 2.5000 mg | INHALATION_SOLUTION | Freq: Four times a day (QID) | RESPIRATORY_TRACT | Status: DC | PRN

## 2018-04-09 MED ORDER — BUPIVACAINE HCL (PF) 0.5 % IJ SOLN
INTRAMUSCULAR | Status: DC | PRN
  Administered 2018-04-09: 15 mL

## 2018-04-09 MED ORDER — BUPIVACAINE LIPOSOME 1.3 % IJ SUSP
INTRAMUSCULAR | Status: DC | PRN
  Administered 2018-04-09: 10 mL via PERINEURAL

## 2018-04-09 MED ORDER — FENTANYL CITRATE (PF) 250 MCG/5ML IJ SOLN
INTRAMUSCULAR | Status: AC
  Filled 2018-04-09: qty 5

## 2018-04-09 MED ORDER — MEPERIDINE HCL 50 MG/ML IJ SOLN: 6.2500 mg | INTRAMUSCULAR | Status: DC | PRN

## 2018-04-09 MED ORDER — DOCUSATE SODIUM 100 MG PO CAPS: 100.0000 mg | ORAL_CAPSULE | Freq: Three times a day (TID) | ORAL | 0 refills | Status: DC | PRN

## 2018-04-09 MED ORDER — MIDAZOLAM HCL 5 MG/5ML IJ SOLN
INTRAMUSCULAR | Status: DC | PRN
  Administered 2018-04-09: 2 mg via INTRAVENOUS

## 2018-04-09 MED ORDER — DEXAMETHASONE SODIUM PHOSPHATE 10 MG/ML IJ SOLN
INTRAMUSCULAR | Status: AC
  Filled 2018-04-09: qty 1

## 2018-04-09 MED ORDER — GLYCOPYRROLATE 0.2 MG/ML IJ SOLN
INTRAMUSCULAR | Status: DC | PRN
  Administered 2018-04-09: 0.2 mg via INTRAVENOUS

## 2018-04-09 MED ORDER — LIDOCAINE HCL (CARDIAC) PF 100 MG/5ML IV SOSY
PREFILLED_SYRINGE | INTRAVENOUS | Status: DC | PRN
  Administered 2018-04-09: 20 mg via INTRAVENOUS
  Administered 2018-04-09: 100 mg via INTRAVENOUS

## 2018-04-09 MED ORDER — LACTATED RINGERS IV SOLN: INTRAVENOUS | Status: DC

## 2018-04-09 MED ORDER — ROCURONIUM BROMIDE 10 MG/ML (PF) SYRINGE
PREFILLED_SYRINGE | INTRAVENOUS | Status: AC
  Filled 2018-04-09: qty 10

## 2018-04-09 SURGICAL SUPPLY — 76 items
ANCH SUT SWLK 19.1X4.75 VT (Anchor) ×4 IMPLANT
ANCHOR PEEK 4.75X19.1 SWLK C (Anchor) ×8 IMPLANT
BLADE SURG 10 STRL SS (BLADE) ×2 IMPLANT
BLADE SURG 11 STRL SS (BLADE) ×5 IMPLANT
BUR OVAL 4.0 (BURR) ×3 IMPLANT
CANISTER OMNI JUG 16 LITER (MISCELLANEOUS) ×3 IMPLANT
CANNULA 5.75X71 LONG (CANNULA) ×5 IMPLANT
CANNULA TWIST IN 8.25X7CM (CANNULA) ×2 IMPLANT
CHLORAPREP W/TINT 26ML (MISCELLANEOUS) ×3 IMPLANT
COVER SURGICAL LIGHT HANDLE (MISCELLANEOUS) ×3 IMPLANT
DRAPE IMP U-DRAPE 54X76 (DRAPES) ×3 IMPLANT
DRAPE INCISE IOBAN 66X45 STRL (DRAPES) ×3 IMPLANT
DRAPE ORTHO SPLIT 77X108 STRL (DRAPES) ×3
DRAPE STERI 35X30 U-POUCH (DRAPES) ×3 IMPLANT
DRAPE SURG ORHT 6 SPLT 77X108 (DRAPES) IMPLANT
DRAPE U-SHAPE 47X51 STRL (DRAPES) ×3 IMPLANT
DRILL BIT PANALOK RC 3.2 (BIT) IMPLANT
DRSG EMULSION OIL 3X3 NADH (GAUZE/BANDAGES/DRESSINGS) ×4 IMPLANT
DRSG PAD ABDOMINAL 8X10 ST (GAUZE/BANDAGES/DRESSINGS) ×8 IMPLANT
ELECT NDL TIP 2.8 STRL (NEEDLE) IMPLANT
ELECT NEEDLE TIP 2.8 STRL (NEEDLE) IMPLANT
ELECT REM PT RETURN 9FT ADLT (ELECTROSURGICAL) ×3
ELECTRODE REM PT RTRN 9FT ADLT (ELECTROSURGICAL) IMPLANT
GAUZE SPONGE 4X4 12PLY STRL (GAUZE/BANDAGES/DRESSINGS) ×3 IMPLANT
GAUZE XEROFORM 1X8 LF (GAUZE/BANDAGES/DRESSINGS) ×2 IMPLANT
GLOVE BIO SURGEON STRL SZ7 (GLOVE) ×6 IMPLANT
GLOVE BIO SURGEON STRL SZ7.5 (GLOVE) ×3 IMPLANT
GLOVE BIOGEL PI IND STRL 7.0 (GLOVE) ×1 IMPLANT
GLOVE BIOGEL PI IND STRL 8 (GLOVE) ×1 IMPLANT
GLOVE BIOGEL PI INDICATOR 7.0 (GLOVE) ×2
GLOVE BIOGEL PI INDICATOR 8 (GLOVE) ×2
GOWN STRL REUS W/ TWL LRG LVL3 (GOWN DISPOSABLE) ×3 IMPLANT
GOWN STRL REUS W/TWL LRG LVL3 (GOWN DISPOSABLE) ×9
KIT BASIN OR (CUSTOM PROCEDURE TRAY) ×3 IMPLANT
KIT TURNOVER KIT B (KITS) ×3 IMPLANT
MANIFOLD NEPTUNE II (INSTRUMENTS) ×3 IMPLANT
NDL HYPO 25GX1X1/2 BEV (NEEDLE) ×1 IMPLANT
NDL SPNL 18GX3.5 QUINCKE PK (NEEDLE) ×1 IMPLANT
NDL SUT 6 .5 CRC .975X.05 MAYO (NEEDLE) IMPLANT
NEEDLE HYPO 25GX1X1/2 BEV (NEEDLE) ×3 IMPLANT
NEEDLE MAYO TAPER (NEEDLE)
NEEDLE SPNL 18GX3.5 QUINCKE PK (NEEDLE) ×3 IMPLANT
NS IRRIG 1000ML POUR BTL (IV SOLUTION) ×3 IMPLANT
PACK SHOULDER (CUSTOM PROCEDURE TRAY) ×3 IMPLANT
PACK UNIVERSAL I (CUSTOM PROCEDURE TRAY) ×3 IMPLANT
PAD ARMBOARD 7.5X6 YLW CONV (MISCELLANEOUS) ×6 IMPLANT
PROBE BIPOLAR ATHRO 135MM 90D (MISCELLANEOUS) ×2 IMPLANT
RESECTOR FULL RADIUS 4.2MM (BLADE) ×3 IMPLANT
SET ARTHROSCOPY TUBING (MISCELLANEOUS) ×3
SET ARTHROSCOPY TUBING LN (MISCELLANEOUS) ×1 IMPLANT
SLING ARM FOAM STRAP LRG (SOFTGOODS) ×3 IMPLANT
SLING ARM FOAM STRAP MED (SOFTGOODS) IMPLANT
SPONGE LAP 4X18 RFD (DISPOSABLE) IMPLANT
SUCTION FRAZIER HANDLE 10FR (MISCELLANEOUS)
SUCTION TUBE FRAZIER 10FR DISP (MISCELLANEOUS) IMPLANT
SUPPORT WRAP ARM LG (MISCELLANEOUS) ×3 IMPLANT
SUT BONE WAX W31G (SUTURE) IMPLANT
SUT ETHIBOND 2 OS 4 DA (SUTURE) IMPLANT
SUT ETHIBOND NAB BRD #0 18IN (SUTURE) IMPLANT
SUT ETHILON 3 0 FSL (SUTURE) ×2 IMPLANT
SUT ETHILON 3 0 PS 1 (SUTURE) ×3 IMPLANT
SUT FIBERWIRE #2 38 T-5 BLUE (SUTURE)
SUT VIC AB 0 CT2 27 (SUTURE) IMPLANT
SUT VIC AB 2-0 CT1 27 (SUTURE)
SUT VIC AB 2-0 CT1 TAPERPNT 27 (SUTURE) IMPLANT
SUT VIC AB 2-0 FS1 27 (SUTURE) IMPLANT
SUTURE FIBERWR #2 38 T-5 BLUE (SUTURE) IMPLANT
SYR CONTROL 10ML LL (SYRINGE) ×3 IMPLANT
TAPE CLOTH SURG 4X10 WHT LF (GAUZE/BANDAGES/DRESSINGS) ×2 IMPLANT
TAPE FIBER 2MM 7IN #2 BLUE (SUTURE) ×2 IMPLANT
TOWEL OR 17X24 6PK STRL BLUE (TOWEL DISPOSABLE) ×3 IMPLANT
TOWEL OR 17X26 10 PK STRL BLUE (TOWEL DISPOSABLE) ×3 IMPLANT
TUBE CONNECTING 12'X1/4 (SUCTIONS) ×1
TUBE CONNECTING 12X1/4 (SUCTIONS) ×2 IMPLANT
WAND HAND CNTRL MULTIVAC 90 (MISCELLANEOUS) ×3 IMPLANT
WATER STERILE IRR 1000ML POUR (IV SOLUTION) ×3 IMPLANT

## 2018-04-09 NOTE — Discharge Instructions (Signed)

## 2018-04-09 NOTE — Op Note (Signed)
Procedure(s):  Procedure Note  Shontell Prosser female 57 y.o. 04/09/2018  Procedure(s) and Anesthesia Type:    #1 right shoulder arthroscopic rotator cuff repair #2 right shoulder arthroscopic subacromial decompression #3 right shoulder arthroscopic distal clavicle excision  Postoperative diagnosis: #1 right shoulder rotator cuff tear #2 right shoulder impingement #3 right shoulder AC joint internal derangement  Surgeon(s) and Role:    Tania Ade, MD - Primary     Surgeon: Isabella Stalling   Assistants: Jeanmarie Hubert PA-C (Danielle was present and scrubbed throughout the procedure and was essential in positioning, assisting with the camera and instrumentation,, and closure)  Anesthesia: General endotracheal anesthesia with preoperative interscalene block given by the attending anesthesiologist    Procedure Detail  Estimated Blood Loss: Min         Drains: none  Blood Given: none         Specimens: none        Complications:  * No complications entered in OR log *         Disposition: PACU - hemodynamically stable.         Condition: stable    Procedure:   INDICATIONS FOR SURGERY: The patient is 57 y.o. female who was injured at work.  She had right shoulder pain which failed conservative measures.  She was found to have high-grade rotator cuff tear and indicated for surgical management.  OPERATIVE FINDINGS: Examination under anesthesia: No significant stiffness or instability  DESCRIPTION OF PROCEDURE: The patient was identified in preoperative  holding area where I personally marked the operative site after  verifying site, side, and procedure with the patient. An interscalene block was given by the attending anesthesiologist the holding area.  The patient was taken back to the operating room where general anesthesia was induced without complication and was placed in the beach-chair position with the back  elevated about 60 degrees and all  extremities and head and neck carefully padded and  positioned.   The right upper extremity was then prepped and  draped in a standard sterile fashion. The appropriate time-out  procedure was carried out. The patient did receive IV antibiotics  within 30 minutes of incision.   A small posterior portal incision was made and the arthroscope was introduced into the joint. An anterior portal was then established above the subscapularis using needle localization. Small cannula was placed anteriorly. Diagnostic arthroscopy was then carried out.  The subscapularis was intact with mild fraying which was debrided.  There was a anterior degenerative labral tear which was debrided back to healthy labrum.  The superior labrum was intact.  The biceps tendon was pulled into the joint was noted to be intact.  Glenohumeral joint surfaces were intact without significant chondromalacia.  The supraspinatus was torn with a full-thickness tear but minimal retraction.  This was lightly debrided from the undersurface.  The arthroscope was then introduced into the subacromial space a standard lateral portal was established with needle localization. The shaver was used through the lateral portal to perform extensive bursectomy. Coracoacromial ligament was examined and found to be frayed indicating chronic impingement.  The supraspinatus tear was identified and debrided back to healthy tendon.  The tuberosity was debrided down to bleeding bone to promote healing.  The tendon was easily reduced over the tuberosity.  The repair was then carried out by placing two 4.75 peek swivel lock anchors just off the articular margin preloaded with a Tiger tape and a fiber tape.  These 4 suture strands  were then passed evenly throughout the tear anterior to posterior and brought over into 2 additional 4.75 peek swivel lock anchors in the lateral row bringing the tendon down nicely over the prepared tuberosity.  #2 FiberWire in the posterior  anchor was used to secure the posterior aspect of the repair.  The coracoacromial ligament was taken down off the anterior acromion with the ArthroCare exposing a moderate hooked anterior acromial spur. A high-speed bur was then used through the lateral portal to take down the anterior acromial spur from lateral to medial in a standard acromioplasty.  The acromioplasty was also viewed from the lateral portal and the bur was used as necessary to ensure that the acromion was completely flat from posterior to anterior.  The distal clavicle was exposed arthroscopically and the bur was used to take off the undersurface for approximately 8 mm from the lateral portal. The bur was then moved to an anterior portal position to complete the distal clavicle excision resecting about 8 mm of the distal clavicle and a smooth even fashion. This was viewed from anterior and lateral portals and felt to be complete.  The arthroscopic equipment was removed from the joint and the portals were closed with 3-0 nylon in an interrupted fashion. Sterile dressings were then applied including Xeroform 4 x 4's ABDs and tape. The patient was then allowed to awaken from general anesthesia, placed in a sling, transferred to the stretcher and taken to the recovery room in stable condition.   POSTOPERATIVE PLAN: The patient will be discharged home today and will followup in one week for suture removal and wound check.

## 2018-04-09 NOTE — Progress Notes (Signed)
Lunch relief by MA West Carbo RN. Pt was on room air, O2 sat 91-92 %. Up in recliner.  Instructed in use of IS-uses up to 750 CC, strong, dry cough effort. O2 sat up to 93-96%.

## 2018-04-09 NOTE — Anesthesia Postprocedure Evaluation (Signed)
Anesthesia Post Note  Patient: Sharon Swanson  Procedure(s) Performed: SHOULDER ARTHROSCOPY WITH ROTATOR CUFF REPAIR VS. DEBRIDEMENT,  SUBACROMIAL DECOMPRESSION AND DISTAL CLAVICLE EXCISION (Right Shoulder)     Patient location during evaluation: PACU Anesthesia Type: General Level of consciousness: awake and alert Pain management: pain level controlled Vital Signs Assessment: post-procedure vital signs reviewed and stable Respiratory status: spontaneous breathing, nonlabored ventilation, respiratory function stable and patient connected to nasal cannula oxygen Cardiovascular status: blood pressure returned to baseline and stable Postop Assessment: no apparent nausea or vomiting Anesthetic complications: no    Last Vitals:  Vitals:   04/09/18 1130 04/09/18 1142  BP:  132/76  Pulse: 91 92  Resp: 20 18  Temp:  (!) 36.4 C  SpO2: 93% 94%    Last Pain:  Vitals:   04/09/18 1130  TempSrc:   PainSc: 0-No pain                 Montez Hageman

## 2018-04-09 NOTE — Anesthesia Preprocedure Evaluation (Addendum)
Anesthesia Evaluation  Patient identified by MRN, date of birth, ID band Patient awake    Reviewed: Allergy & Precautions, NPO status , Patient's Chart, lab work & pertinent test results  Airway Mallampati: III  TM Distance: >3 FB Neck ROM: Full    Dental  (+) Poor Dentition, Dental Advisory Given,    Pulmonary asthma , COPD, Current Smoker,    Pulmonary exam normal breath sounds clear to auscultation       Cardiovascular hypertension, Pt. on medications Normal cardiovascular exam Rhythm:Regular Rate:Normal     Neuro/Psych negative neurological ROS  negative psych ROS   GI/Hepatic negative GI ROS, Neg liver ROS,   Endo/Other  diabetes, Type 2, Insulin DependentMorbid obesity  Renal/GU negative Renal ROS  negative genitourinary   Musculoskeletal negative musculoskeletal ROS (+)   Abdominal   Peds negative pediatric ROS (+)  Hematology negative hematology ROS (+)   Anesthesia Other Findings   Reproductive/Obstetrics negative OB ROS                            Anesthesia Physical Anesthesia Plan  ASA: III  Anesthesia Plan: General   Post-op Pain Management:  Regional for Post-op pain   Induction: Intravenous  PONV Risk Score and Plan: 2 and Ondansetron and Treatment may vary due to age or medical condition  Airway Management Planned: Oral ETT  Additional Equipment:   Intra-op Plan:   Post-operative Plan: Extubation in OR  Informed Consent: I have reviewed the patients History and Physical, chart, labs and discussed the procedure including the risks, benefits and alternatives for the proposed anesthesia with the patient or authorized representative who has indicated his/her understanding and acceptance.   Dental advisory given  Plan Discussed with: CRNA  Anesthesia Plan Comments: (Risk of post op dyspnea with block explained)        Anesthesia Quick Evaluation

## 2018-04-09 NOTE — Transfer of Care (Signed)
Immediate Anesthesia Transfer of Care Note  Patient: Kimie Pidcock  Procedure(s) Performed: SHOULDER ARTHROSCOPY WITH ROTATOR CUFF REPAIR VS. DEBRIDEMENT,  SUBACROMIAL DECOMPRESSION AND DISTAL CLAVICLE EXCISION (Right Shoulder)  Patient Location: PACU  Anesthesia Type:General and GA combined with regional for post-op pain  Level of Consciousness: awake, alert  and oriented  Airway & Oxygen Therapy: Patient Spontanous Breathing and Patient connected to face mask oxygen  Post-op Assessment: Report given to RN and Post -op Vital signs reviewed and stable  Post vital signs: Reviewed and stable  Last Vitals:  Vitals Value Taken Time  BP 114/56 04/09/2018  9:33 AM  Temp    Pulse 94 04/09/2018  9:39 AM  Resp 19 04/09/2018  9:39 AM  SpO2 94 % 04/09/2018  9:39 AM  Vitals shown include unvalidated device data.  Last Pain:  Vitals:   04/09/18 0619  TempSrc: Oral         Complications: No apparent anesthesia complications

## 2018-04-09 NOTE — H&P (Signed)
Sharon Swanson is an 57 y.o. female.   Chief Complaint: R shoulder pain HPI: The patient was injured at work in April of this year.  She has had right shoulder pain which is failed conservative management.  She has a partial-thickness rotator cuff tear and injury to the [facility] joint.  Indicated for surgery to try and decrease pain and restore function.  Past Medical History:  Diagnosis Date  . Acid reflux   . Asthma   . COPD (chronic obstructive pulmonary disease) ([place])   . Diabetes mellitus without complication ([place])    Type II  . Fatty liver 11/08/2011  . Fibroids 11/08/2011  . Hypertension   . Obesity (BMI 30-39.9) 11/08/2011  . Osteoarthritis of right acromioclavicular joint   . Pancreatitis 11/08/2011  . Rotator cuff tear, right   . Sleep apnea    Can not afford Cpap machine  . Ventral hernia     Past Surgical History:  Procedure Laterality Date  . ORTHOPEDIC SURGERY     R ankle, tendonitis    Family History  Problem Relation Age of Onset  . Hypertension Mother   . Diabetes Other    Social History:  reports that she has been smoking cigarettes.  She has been smoking about 0.50 packs per day. She has never used smokeless tobacco. She reports that she does not drink alcohol or use drugs.  Allergies:  Allergies  Allergen Reactions  . Aspirin Other (See Comments)    Abdominal pain    Medications Prior to Admission  Medication Sig Dispense Refill  . albuterol (PROVENTIL HFA;VENTOLIN HFA) 108 (90 Base) MCG/ACT inhaler Inhale 2 puffs into the lungs every 4 (four) hours as needed for wheezing or shortness of breath. 1 Inhaler 2  . albuterol (PROVENTIL) (2.5 MG/3ML) 0.083% nebulizer solution Take 3 mLs (2.5 mg total) by nebulization every 6 (six) hours as needed for wheezing or shortness of breath. 30 vial 0  . budesonide-formoterol (SYMBICORT) 80-4.5 MCG/ACT inhaler Inhale 2 puffs into the lungs 2 (two) times daily.    . cloNIDine (CATAPRES) 0.1 MG tablet Take 0.1 mg by mouth 2  (two) times daily.    . fluticasone (FLONASE) 50 MCG/ACT nasal spray Place 1 spray into both nostrils daily. (Patient taking differently: Place 1 spray into both nostrils daily as needed (for allergies.). ) 16 g 0  . insulin glargine (LANTUS) 100 UNIT/ML injection Inject 12 Units into the skin at bedtime.    . liraglutide (VICTOZA) 18 MG/3ML SOPN Inject 0.6 mg into the skin daily. May increase up to 1.63m    . losartan-hydrochlorothiazide (HYZAAR) 100-25 MG tablet Take 1 tablet by mouth daily.    . metFORMIN (GLUCOPHAGE) 500 MG tablet Take 500 mg by mouth 2 (two) times daily with a meal.    . pravastatin (PRAVACHOL) 80 MG tablet Take 80 mg by mouth at bedtime.    . ranitidine (ZANTAC) 150 MG tablet Take 150 mg by mouth 2 (two) times daily.    . sodium-potassium bicarbonate (ALKA-SELTZER GOLD) TBEF dissolvable tablet Take 1 tablet by mouth daily as needed.    . verapamil (CALAN-SR) 240 MG CR tablet Take 360 mg by mouth daily.     .Marland Kitchenazithromycin (ZITHROMAX) 250 MG tablet Take 1 tablet (250 mg total) by mouth daily. Take first 2 tablets together, then 1 every day until finished. 6 tablet 0  . Fluticasone-Salmeterol (ADVAIR DISKUS) 250-50 MCG/DOSE AEPB Inhale 1 puff into the lungs 2 (two) times daily. 60 each 5  .  glucose monitoring kit (FREESTYLE) monitoring kit 1 each by Does not apply route as needed for other. 1 each 0  . guaiFENesin (ROBITUSSIN) 100 MG/5ML SOLN Take 15 mLs (300 mg total) by mouth every 6 (six) hours as needed for cough or to loosen phlegm. 118 mL 0  . hydrochlorothiazide (MICROZIDE) 12.5 MG capsule Take 1 capsule (12.5 mg total) by mouth daily. 30 capsule 0  . HYDROcodone-homatropine (HYCODAN) 5-1.5 MG/5ML syrup Take 5 mLs by mouth every 6 (six) hours as needed for cough. 180 mL 0  . insulin aspart (NOVOLOG) 100 UNIT/ML injection Inject 5 Units into the skin 3 (three) times daily with meals. 10 mL 11  . insulin glargine (LANTUS) 100 UNIT/ML injection Inject 0.12 mLs (12 Units  total) into the skin daily. 10 mL 11  . ipratropium (ATROVENT) 0.02 % nebulizer solution Take 2.5 mLs (0.5 mg total) by nebulization 4 (four) times daily. 75 mL 12  . ipratropium-albuterol (DUONEB) 0.5-2.5 (3) MG/3ML SOLN Take 3 mLs by nebulization every 6 (six) hours. 360 mL 1  . meclizine (ANTIVERT) 25 MG tablet Take 1 tablet (25 mg total) by mouth 3 (three) times daily as needed for dizziness. 30 tablet 0  . mometasone-formoterol (DULERA) 100-5 MCG/ACT AERO Inhale 2 puffs into the lungs 2 (two) times daily. 1 Inhaler 0  . morphine (MSIR) 15 MG tablet Take 1 tablet (15 mg total) by mouth every 4 (four) hours as needed for severe pain. 10 tablet 0  . predniSONE (DELTASONE) 10 MG tablet Take 6 tablets (60 mg total) by mouth daily. 30 tablet 0    Results for orders placed or performed during the hospital encounter of 04/09/18 (from the past 48 hour(s))  Glucose, capillary     Status: Abnormal   Collection Time: 04/09/18  6:31 AM  Result Value Ref Range   Glucose-Capillary 146 (H) 70 - 99 mg/dL   Comment 1 Notify RN    Comment 2 Document in Chart   CBC     Status: Abnormal   Collection Time: 04/09/18  6:32 AM  Result Value Ref Range   WBC 4.5 4.0 - 10.5 K/uL   RBC 5.55 (H) 3.87 - 5.11 MIL/uL   Hemoglobin 15.4 (H) 12.0 - 15.0 g/dL   HCT 50.5 (H) 36.0 - 46.0 %   MCV 91.0 78.0 - 100.0 fL   MCH 27.7 26.0 - 34.0 pg   MCHC 30.5 30.0 - 36.0 g/dL   RDW 14.4 11.5 - 15.5 %   Platelets 187 150 - 400 K/uL    Comment: Performed at [facility], [address], [place], [place] [address]  Basic metabolic panel     Status: Abnormal   Collection Time: 04/09/18  6:32 AM  Result Value Ref Range   Sodium 142 135 - 145 mmol/L   Potassium 4.3 3.5 - 5.1 mmol/L   Chloride 103 98 - 111 mmol/L    Comment: Please note change in reference range.   CO2 29 22 - 32 mmol/L   Glucose, Bld 144 (H) 70 - 99 mg/dL    Comment: Please note change in reference range.   BUN 19 6 - 20 mg/dL    Comment: Please  note change in reference range.   Creatinine, Ser 0.71 0.44 - 1.00 mg/dL   Calcium 9.6 8.9 - 10.3 mg/dL   GFR calc non Af Amer >60 >60 mL/min   GFR calc Af Amer >60 >60 mL/min    Comment: (NOTE) The eGFR has been  calculated using the CKD EPI equation. This calculation has not been validated in all clinical situations. eGFR's persistently <60 mL/min signify possible Chronic Kidney Disease.    Anion gap 10 5 - 15    Comment: Performed at [facility] [address], [place], [place] [address]   No results found.  Review of Systems  All other systems reviewed and are negative.   Blood pressure 134/82, pulse 81, temperature 98.7 F (37.1 C), temperature source Oral, resp. rate 20, height _0  (1.702 m), weight 136.1 kg (300 lb), SpO2 96 %. Physical Exam  Constitutional: She is oriented to person, place, and time. She appears well-developed and well-nourished.  HENT:  Head: Atraumatic.  Eyes: EOM are normal.  Cardiovascular: Intact distal pulses.  Respiratory: Effort normal.  Musculoskeletal:  R shoulder pain with RC testing, AC TTP. NVID.  Neurological: She is alert and oriented to person, place, and time.  Skin: Skin is warm and dry.  Psychiatric: She has a normal mood and affect.     Assessment/Plan The patient was injured at work in April of this year.  She has had right shoulder pain which is failed conservative management.  She has a partial-thickness rotator cuff tear and injury to the [facility] joint.  Indicated for surgery to try and decrease pain and restore function. Plan right shoulder arthroscopic debridement partial cuff tear, subacromial decompression, distal clavicle excision Risks / benefits of surgery discussed Consent on chart  NPO for OR Preop antibiotics   Isabella Stalling, MD 04/09/2018, 7:13 AM

## 2018-04-09 NOTE — Anesthesia Procedure Notes (Signed)
Anesthesia Regional Block: Supraclavicular block   Pre-Anesthetic Checklist: ,, timeout performed, Correct Patient, Correct Site, Correct Laterality, Correct Procedure, Correct Position, site marked, Risks and benefits discussed,  Surgical consent,  Pre-op evaluation,  At surgeon's request and post-op pain management  Laterality: Right and Upper  Prep: Maximum Sterile Barrier Precautions used, chloraprep       Needles:  Injection technique: Single-shot  Needle Type: Echogenic Stimulator Needle     Needle Length: 10cm      Additional Needles:   Procedures:,,,, ultrasound used (permanent image in chart),,,,  Narrative:  Start time: 04/09/2018 7:05 AM End time: 04/09/2018 7:11 AM Injection made incrementally with aspirations every 5 mL.  Performed by: Personally  Anesthesiologist: Montez Hageman, MD  Additional Notes: Risks, benefits and alternative to block explained extensively.  Patient tolerated procedure well, without complications.

## 2018-04-09 NOTE — Anesthesia Procedure Notes (Signed)
Procedure Name: Intubation Date/Time: 04/09/2018 7:45 AM Performed by: Marsa Aris, CRNA Pre-anesthesia Checklist: Patient identified, Emergency Drugs available, Suction available, Patient being monitored and Timeout performed Patient Re-evaluated:Patient Re-evaluated prior to induction Oxygen Delivery Method: Circle system utilized Preoxygenation: Pre-oxygenation with 100% oxygen Induction Type: IV induction Ventilation: Mask ventilation without difficulty and Oral airway inserted - appropriate to patient size Laryngoscope Size: Glidescope and 3 Grade View: Grade I Tube size: 7.5 mm Number of attempts: 1 Airway Equipment and Method: Patient positioned with wedge pillow and Stylet Placement Confirmation: ETT inserted through vocal cords under direct vision Secured at: 22 cm Tube secured with: Tape Dental Injury: Teeth and Oropharynx as per pre-operative assessment

## 2018-04-09 NOTE — Progress Notes (Signed)
Patient's medicines have not been reconciled prior to arrival.  Attempted to reach pharmacy multiple times with no answer.  Main pharmacy indicated that pharmacy tech does not arrive until 0700.  Patient provided medication list.  Medications reviewed by nurse and list updated to match medication list from provider.

## 2018-04-30 IMAGING — CR DG CHEST 2V
2 series · 2 of 2 positions shown · non-contrast
Comparison: Chest CTA 01/19/2017 and earlier.

CLINICAL DATA: 56-year-old female with nonproductive cough for 1
week. Wheezing, body ache, left ear pain.

EXAM:
CHEST  2 VIEW

[w chest pa]
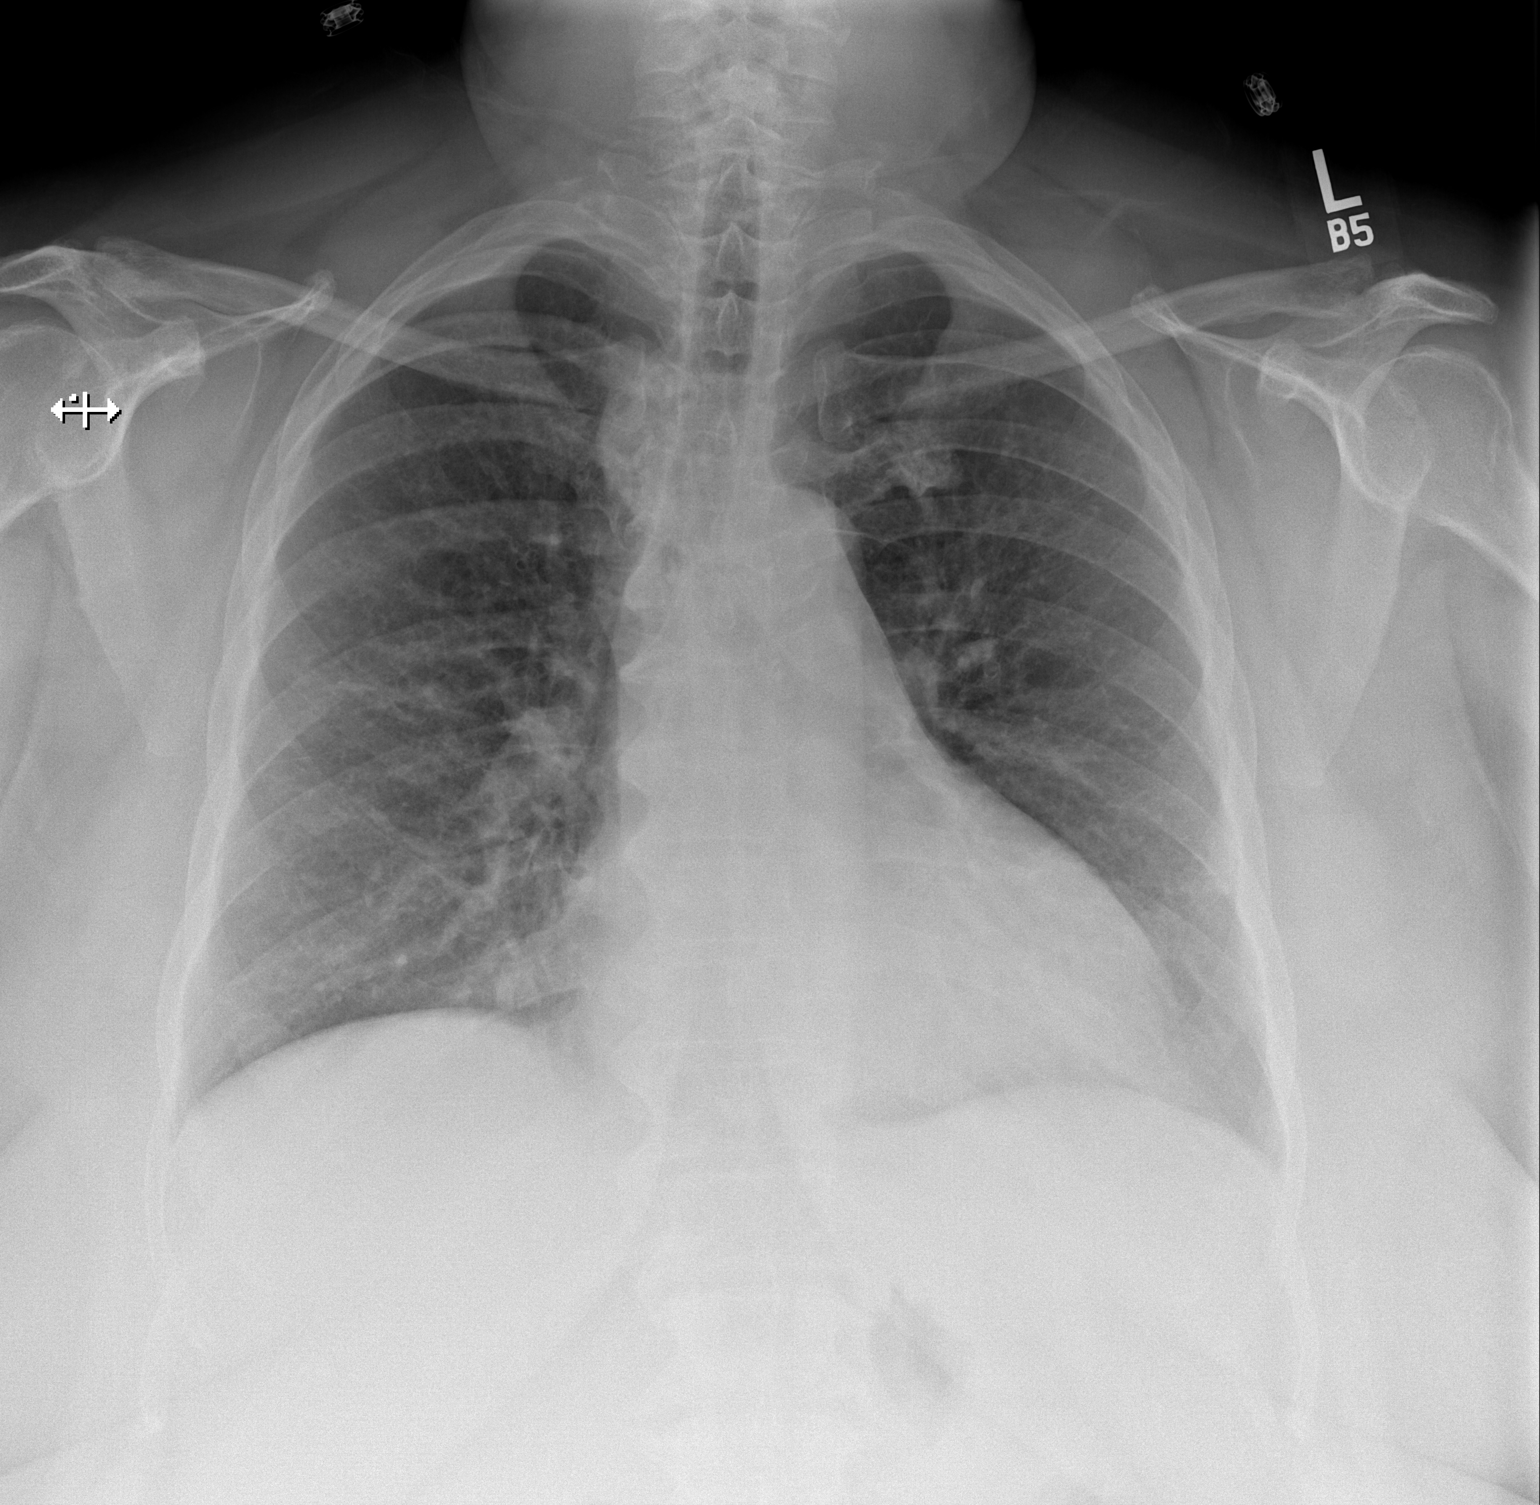

[w chest lat]
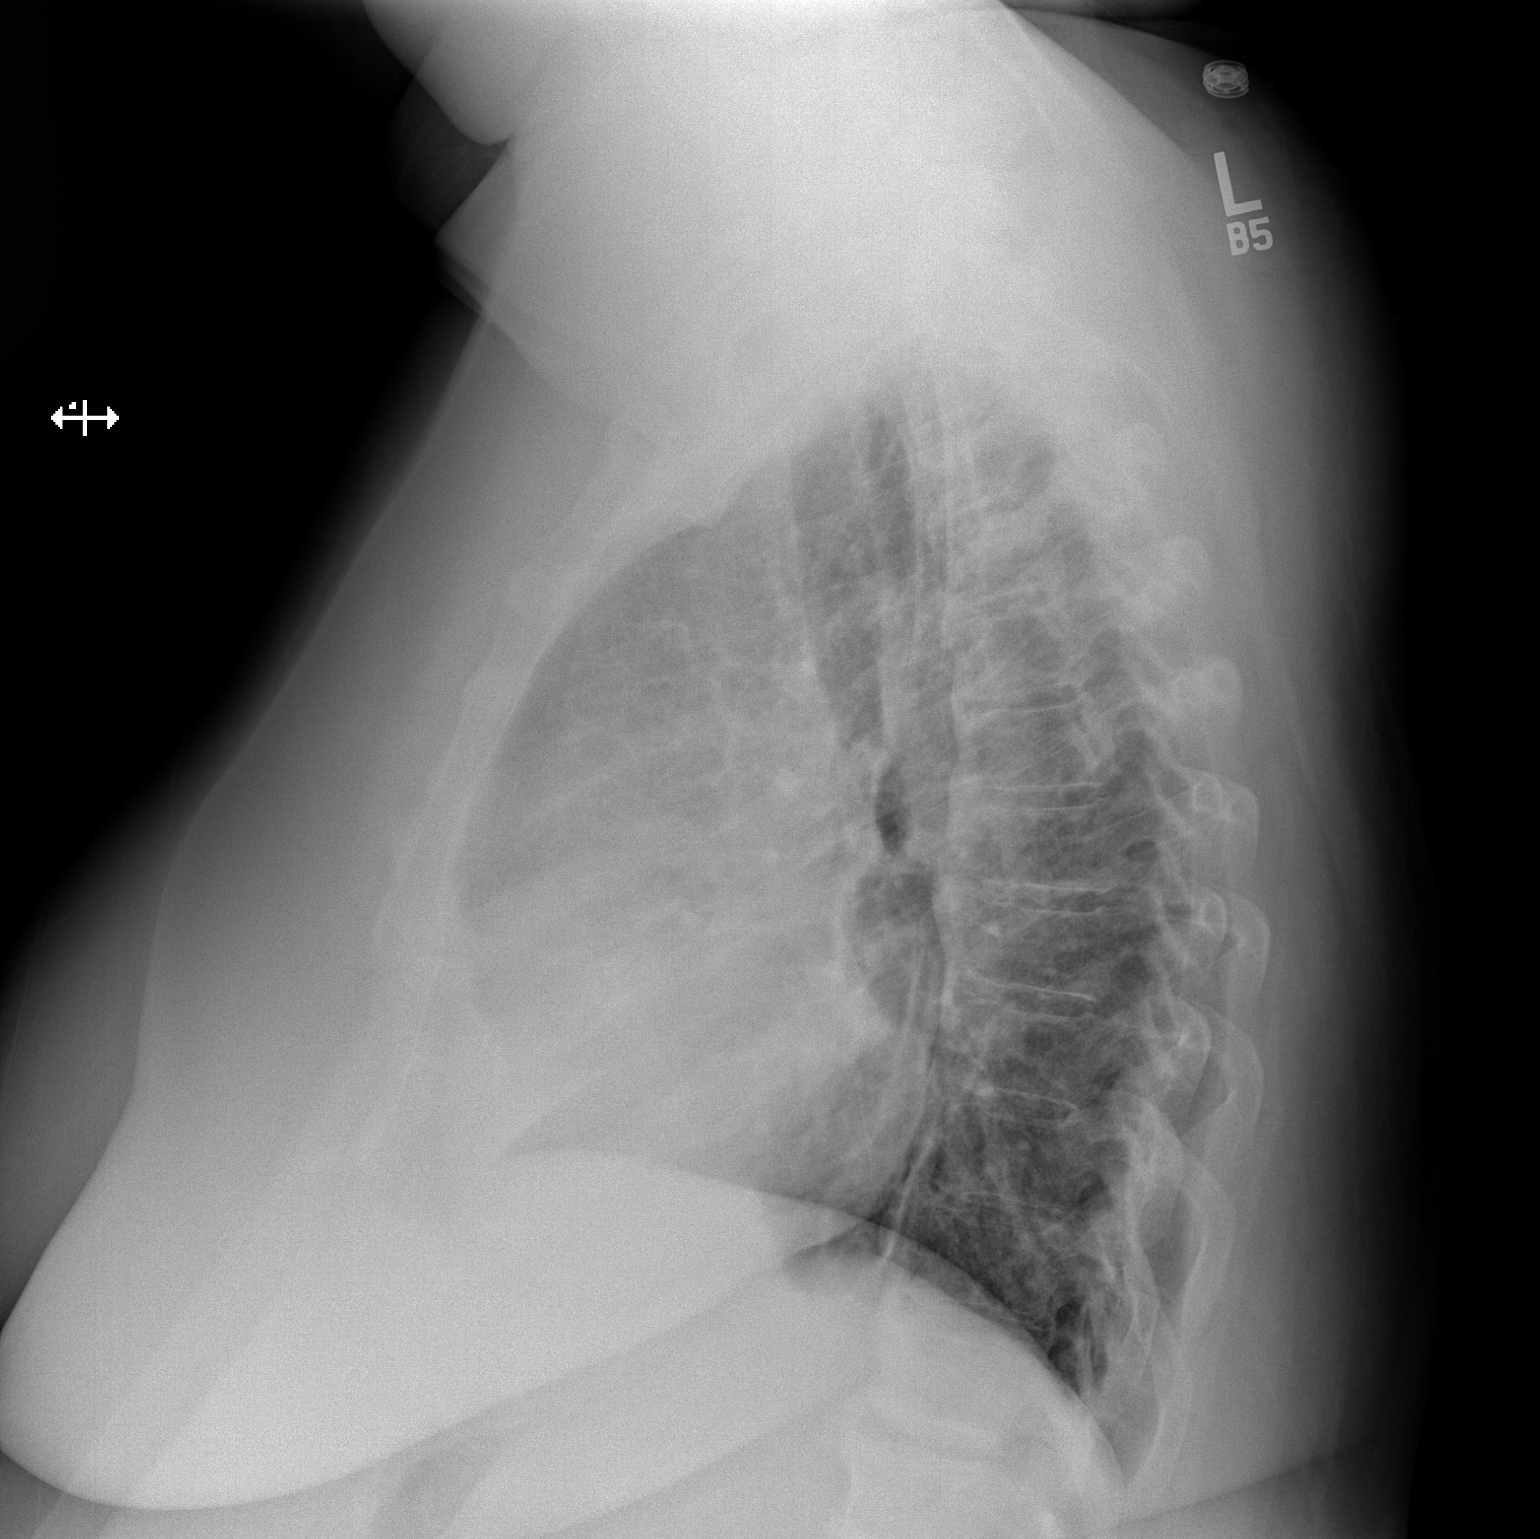

[2 of 2 positions shown; findings below may reference images not displayed]

FINDINGS: Stable mild-to-moderate cardiomegaly. Other mediastinal contours are
within normal limits. Visualized tracheal air column is within
normal limits. Lung volumes are within normal limits. No
pneumothorax, pulmonary edema, pleural effusion or confluent
pulmonary opacity. Interstitium appears stable. No acute osseous
abnormality identified. Negative visible bowel gas pattern.
IMPRESSION: No acute cardiopulmonary abnormality.

## 2018-07-02 ENCOUNTER — Encounter (HOSPITAL_COMMUNITY): Payer: Self-pay | Admitting: *Deleted

## 2018-07-02 ENCOUNTER — Other Ambulatory Visit: Payer: Self-pay

## 2018-07-02 ENCOUNTER — Emergency Department (HOSPITAL_COMMUNITY)
Admission: EM | Admit: 2018-07-02 | Discharge: 2018-07-02 | Disposition: A | Discharge status: Home / Self Care | Payer: BC Managed Care – PPO | Attending: Emergency Medicine | Admitting: Emergency Medicine

## 2018-07-02 DIAGNOSIS — I10 Essential (primary) hypertension: Secondary | ICD-10-CM | POA: Diagnosis not present

## 2018-07-02 DIAGNOSIS — B029 Zoster without complications: Secondary | ICD-10-CM

## 2018-07-02 DIAGNOSIS — F17210 Nicotine dependence, cigarettes, uncomplicated: Secondary | ICD-10-CM | POA: Diagnosis not present

## 2018-07-02 DIAGNOSIS — R21 Rash and other nonspecific skin eruption: Secondary | ICD-10-CM | POA: Diagnosis present

## 2018-07-02 MED ORDER — PREDNISONE 20 MG PO TABS: 40.0000 mg | ORAL_TABLET | Freq: Every day | ORAL | 0 refills | Status: DC

## 2018-07-02 MED ORDER — VALACYCLOVIR HCL 500 MG PO TABS
1000.0000 mg | ORAL_TABLET | Freq: Once | ORAL | Status: AC
  Administered 2018-07-02: 1000 mg via ORAL
  Filled 2018-07-02: qty 2

## 2018-07-02 MED ORDER — ALBUTEROL SULFATE HFA 108 (90 BASE) MCG/ACT IN AERS
2.0000 | INHALATION_SPRAY | Freq: Once | RESPIRATORY_TRACT | Status: AC
  Administered 2018-07-02: 2 via RESPIRATORY_TRACT
  Filled 2018-07-02: qty 6.7

## 2018-07-02 MED ORDER — IBUPROFEN 600 MG PO TABS: 600.0000 mg | ORAL_TABLET | Freq: Four times a day (QID) | ORAL | 0 refills | Status: DC | PRN

## 2018-07-02 MED ORDER — PREDNISONE 50 MG PO TABS
50.0000 mg | ORAL_TABLET | Freq: Once | ORAL | Status: AC
  Administered 2018-07-02: 50 mg via ORAL
  Filled 2018-07-02: qty 1

## 2018-07-02 MED ORDER — GABAPENTIN 300 MG PO CAPS: 300.0000 mg | ORAL_CAPSULE | Freq: Three times a day (TID) | ORAL | 0 refills | Status: DC

## 2018-07-02 MED ORDER — GABAPENTIN 300 MG PO CAPS
300.0000 mg | ORAL_CAPSULE | Freq: Once | ORAL | Status: AC
  Administered 2018-07-02: 300 mg via ORAL
  Filled 2018-07-02: qty 1

## 2018-07-02 MED ORDER — OXYCODONE-ACETAMINOPHEN 5-325 MG PO TABS: 1.0000 | ORAL_TABLET | Freq: Four times a day (QID) | ORAL | 0 refills | Status: DC | PRN

## 2018-07-02 MED ORDER — VALACYCLOVIR HCL 1 G PO TABS: 1000.0000 mg | ORAL_TABLET | Freq: Three times a day (TID) | ORAL | 0 refills | Status: AC

## 2018-07-02 MED ORDER — HYDROMORPHONE HCL 1 MG/ML IJ SOLN
1.0000 mg | Freq: Once | INTRAMUSCULAR | Status: AC
  Administered 2018-07-02: 1 mg via INTRAMUSCULAR
  Filled 2018-07-02: qty 1

## 2018-07-02 MED ORDER — KETOROLAC TROMETHAMINE 15 MG/ML IJ SOLN
15.0000 mg | Freq: Once | INTRAMUSCULAR | Status: AC
  Administered 2018-07-02: 15 mg via INTRAMUSCULAR
  Filled 2018-07-02: qty 1

## 2018-07-02 NOTE — ED Triage Notes (Addendum)
Pt with onset of SOB this morning, hx of COPD, wheezing throughout. Onset of left sided chest and back blistering rash painful that does not cross the midline since yesterday. Pt reports she is out of her inhaler.

## 2018-07-02 NOTE — ED Provider Notes (Signed)
[facility] DEPT Provider Note   CSN: [id number] Arrival date & time: 07/02/18  4825     History   Chief Complaint Chief Complaint  Patient presents with  . Shortness of Breath  . Rash    HPI Sharon Swanson is a 57 y.o. female.  HPI   57 year old female with a painful rash to her left side.  Symptom onset about 2 days ago.  Constant and progressive since then. Pain is worse to the touch and with certain movements and deep breathing.  Denies any acute trauma or strain.  This morning she woke up and she felt like her breathing was worse.  No cough.  No fevers or chills.  No unusual leg pain or swelling.  Past Medical History:  Diagnosis Date  . Acid reflux   . Asthma   . COPD (chronic obstructive pulmonary disease) ([place])   . Diabetes mellitus without complication ([place])    Type II  . Fatty liver 11/08/2011  . Fibroids 11/08/2011  . Hypertension   . Obesity (BMI 30-39.9) 11/08/2011  . Osteoarthritis of right acromioclavicular joint   . Pancreatitis 11/08/2011  . Rotator cuff tear, right   . Sleep apnea    Can not afford Cpap machine  . Ventral hernia     Patient Active Problem List   Diagnosis Date Noted  . Insulin-requiring or dependent type II diabetes mellitus ([place]) 01/19/2017  . Essential hypertension 01/19/2017  . Anxiety 01/19/2017  . Chest pain 01/19/2017  . OSA (obstructive sleep apnea) 01/19/2017  . COPD (chronic obstructive pulmonary disease) ([place])   . Acute respiratory failure ([place]) 08/17/2014  . Umbilical hernia 00/37/0488  . Pulmonary nodule, left 08/17/2014  . Constipation, chronic 08/15/2012  . GERD (gastroesophageal reflux disease) 11/08/2011  . Tobacco abuse 11/08/2011  . Fatty liver 11/08/2011  . BMI 45.0-49.9, adult ([place]) 11/08/2011  . Fibroids 11/08/2011    Past Surgical History:  Procedure Laterality Date  . ORTHOPEDIC SURGERY     R ankle, tendonitis     OB History    Gravida  0   Para  0   Term  0   Preterm  0   AB  0   Living  0     SAB  0   TAB  0   Ectopic  0   Multiple  0   Live Births               Home Medications    Prior to Admission medications   Medication Sig Start Date End Date Taking? Authorizing Provider  albuterol (PROVENTIL HFA;VENTOLIN HFA) 108 (90 Base) MCG/ACT inhaler Inhale 2 puffs into the lungs every 4 (four) hours as needed for wheezing or shortness of breath. 12/20/16   Mannam, Praveen, MD  albuterol (PROVENTIL) (2.5 MG/3ML) 0.083% nebulizer solution Take 3 mLs (2.5 mg total) by nebulization every 6 (six) hours as needed for wheezing or shortness of breath. 07/04/17   Varney Biles, MD  azithromycin (ZITHROMAX) 250 MG tablet Take 1 tablet (250 mg total) by mouth daily. Take first 2 tablets together, then 1 every day until finished. 07/04/17   Varney Biles, MD  budesonide-formoterol (SYMBICORT) 80-4.5 MCG/ACT inhaler Inhale 2 puffs into the lungs 2 (two) times daily.    [provider]  cloNIDine (CATAPRES) 0.1 MG tablet Take 0.1 mg by mouth 2 (two) times daily.    [provider]  docusate sodium (COLACE) 100 MG capsule Take 1 capsule (100 mg total) by mouth  3 (three) times daily as needed. 04/09/18   Grier Mitts, PA-C  fluticasone (FLONASE) 50 MCG/ACT nasal spray Place 1 spray into both nostrils daily. Patient taking differently: Place 1 spray into both nostrils daily as needed (for allergies.).  06/11/16   Hollice Gong, Mir Mohammed, MD  Fluticasone-Salmeterol (ADVAIR DISKUS) 250-50 MCG/DOSE AEPB Inhale 1 puff into the lungs 2 (two) times daily. 12/20/16   Mannam, Hart Robinsons, MD  glucose monitoring kit (FREESTYLE) monitoring kit 1 each by Does not apply route as needed for other. 06/10/16   Hollice Gong, Mir Earlie Server, MD  guaiFENesin (ROBITUSSIN) 100 MG/5ML SOLN Take 15 mLs (300 mg total) by mouth every 6 (six) hours as needed for cough or to loosen phlegm. 01/20/17   Johnson, Clanford L, MD  hydrochlorothiazide (MICROZIDE) 12.5  MG capsule Take 1 capsule (12.5 mg total) by mouth daily. 06/10/16   Hollice Gong, Mir Mohammed, MD  insulin aspart (NOVOLOG) 100 UNIT/ML injection Inject 5 Units into the skin 3 (three) times daily with meals. 06/10/16   Hollice Gong, Mir Mohammed, MD  insulin glargine (LANTUS) 100 UNIT/ML injection Inject 0.12 mLs (12 Units total) into the skin daily. 06/11/16   Hollice Gong, Mir Mohammed, MD  insulin glargine (LANTUS) 100 UNIT/ML injection Inject 12 Units into the skin at bedtime.    [provider]  ipratropium (ATROVENT) 0.02 % nebulizer solution Take 2.5 mLs (0.5 mg total) by nebulization 4 (four) times daily. 01/14/17   Charlesetta Shanks, MD  ipratropium-albuterol (DUONEB) 0.5-2.5 (3) MG/3ML SOLN Take 3 mLs by nebulization every 6 (six) hours. 08/22/14   Hosie Poisson, MD  liraglutide (VICTOZA) 18 MG/3ML SOPN Inject 0.6 mg into the skin daily. May increase up to 1.12m    [provider]  losartan-hydrochlorothiazide (HYZAAR) 100-25 MG tablet Take 1 tablet by mouth daily.    [provider]  meclizine (ANTIVERT) 25 MG tablet Take 1 tablet (25 mg total) by mouth 3 (three) times daily as needed for dizziness. 08/05/16   KLeo Grosser MD  metFORMIN (GLUCOPHAGE) 500 MG tablet Take 500 mg by mouth 2 (two) times daily with a meal. 04/27/16   [provider]  methocarbamol (ROBAXIN) 500 MG tablet Take 1 tablet (500 mg total) by mouth 3 (three) times daily. 04/09/18   LGrier Mitts PA-C  mometasone-formoterol (DULERA) 100-5 MCG/ACT AERO Inhale 2 puffs into the lungs 2 (two) times daily. 07/04/17   NVarney Biles MD  morphine (MSIR) 15 MG tablet Take 1 tablet (15 mg total) by mouth every 4 (four) hours as needed for severe pain. 12/20/17   FDeno Etienne DO  oxyCODONE-acetaminophen (PERCOCET) 5-325 MG tablet Take 1-2 tablets by mouth every 4 (four) hours as needed for severe pain. 04/09/18   LGrier Mitts PA-C  pravastatin (PRAVACHOL) 80 MG tablet Take 80 mg by mouth at  bedtime.    [provider]  predniSONE (DELTASONE) 10 MG tablet Take 6 tablets (60 mg total) by mouth daily. 07/04/17   NVarney Biles MD  ranitidine (ZANTAC) 150 MG tablet Take 150 mg by mouth 2 (two) times daily. 04/25/16 04/09/18  [provider]  sodium-potassium bicarbonate (ALKA-SELTZER GOLD) TBEF dissolvable tablet Take 1 tablet by mouth daily as needed.    [provider]  verapamil (CALAN-SR) 240 MG CR tablet Take 360 mg by mouth daily.  06/02/16   [provider]    Family History Family History  Problem Relation Age of Onset  . Hypertension Mother   . Diabetes Other     Social History Social History  Tobacco Use  . Smoking status: Current Every Day Smoker    Packs/day: 0.50    Types: Cigarettes  . Smokeless tobacco: Never Used  Substance Use Topics  . Alcohol use: No  . Drug use: No     Allergies   Aspirin   Review of Systems Review of Systems  All systems reviewed and negative, other than as noted in HPI.  Physical Exam Updated Vital Signs BP (!) 161/108 (BP Location: Right Arm)   Pulse (!) 109   Temp 97.9 F (36.6 C) (Oral)   Resp (!) 24   SpO2 100%   Physical Exam  Constitutional: She appears well-developed and well-nourished. No distress.  HENT:  Head: Normocephalic and atraumatic.  Eyes: Conjunctivae are normal. Right eye exhibits no discharge. Left eye exhibits no discharge.  Neck: Neck supple.  Cardiovascular: Normal rate, regular rhythm and normal heart sounds. Exam reveals no gallop and no friction rub.  No murmur heard. Pulmonary/Chest: Effort normal. No respiratory distress. She has wheezes.  Abdominal: Soft. She exhibits no distension. There is no tenderness.  Musculoskeletal: She exhibits no edema or tenderness.  Neurological: She is alert.  Skin: Skin is warm and dry. Rash noted.  Vesicular rash consistent with shingles to left ~T4 level.  Psychiatric: Her behavior is normal. Thought content  normal.  Nursing note and vitals reviewed.    ED Treatments / Results  Labs (all labs ordered are listed, but only abnormal results are displayed) Labs Reviewed - No data to display  EKG None  Radiology No results found.  Procedures Procedures (including critical care time)  Medications Ordered in ED Medications  ketorolac (TORADOL) 15 MG/ML injection 15 mg (has no administration in time range)  predniSONE (DELTASONE) tablet 50 mg (has no administration in time range)  HYDROmorphone (DILAUDID) injection 1 mg (has no administration in time range)  gabapentin (NEURONTIN) capsule 300 mg (has no administration in time range)  valACYclovir (VALTREX) tablet 1,000 mg (has no administration in time range)  albuterol (PROVENTIL HFA;VENTOLIN HFA) 108 (90 Base) MCG/ACT inhaler 2 puff (has no administration in time range)     Initial Impression / Assessment and Plan / ED Course  I have reviewed the triage vital signs and the nursing notes.  Pertinent labs & imaging results that were available during my care of the patient were reviewed by me and considered in my medical decision making (see chart for details).     57 year old female with shingles.  Will start treatment.  She is complaining some shortness of breath, but suspect that her symptoms are related to increased pain with tension on her skin with chest wall movement.  She sounds clear to me on exam.  Oxygen saturations are 100% on room air.  Mildly tachycardic but she is crying and in obvious pain.  Pain meds, anti-inflammatories, antivirals.  Steroid should theoretically help her breathing as well if there is some component of COPD exacerbation..  She is out of her albuterol inhaler and mom was provided for her.  Final Clinical Impressions(s) / ED Diagnoses   Final diagnoses:  Herpes zoster without complication    ED Discharge Orders    None       Virgel Manifold, MD 07/02/18 0710

## 2018-07-02 NOTE — ED Notes (Signed)
ED Provider at bedside. 

## 2018-09-30 ENCOUNTER — Encounter (HOSPITAL_COMMUNITY): Payer: Self-pay

## 2018-09-30 ENCOUNTER — Other Ambulatory Visit: Payer: Self-pay

## 2018-09-30 ENCOUNTER — Emergency Department (HOSPITAL_COMMUNITY): Payer: BC Managed Care – PPO

## 2018-09-30 DIAGNOSIS — F17210 Nicotine dependence, cigarettes, uncomplicated: Secondary | ICD-10-CM | POA: Diagnosis not present

## 2018-09-30 DIAGNOSIS — Z794 Long term (current) use of insulin: Secondary | ICD-10-CM | POA: Insufficient documentation

## 2018-09-30 DIAGNOSIS — M25511 Pain in right shoulder: Secondary | ICD-10-CM | POA: Insufficient documentation

## 2018-09-30 DIAGNOSIS — Z79899 Other long term (current) drug therapy: Secondary | ICD-10-CM | POA: Insufficient documentation

## 2018-09-30 DIAGNOSIS — R0789 Other chest pain: Secondary | ICD-10-CM | POA: Diagnosis present

## 2018-09-30 DIAGNOSIS — J449 Chronic obstructive pulmonary disease, unspecified: Secondary | ICD-10-CM | POA: Insufficient documentation

## 2018-09-30 DIAGNOSIS — E119 Type 2 diabetes mellitus without complications: Secondary | ICD-10-CM | POA: Insufficient documentation

## 2018-09-30 DIAGNOSIS — I10 Essential (primary) hypertension: Secondary | ICD-10-CM | POA: Insufficient documentation

## 2018-09-30 NOTE — ED Triage Notes (Signed)
Pt complains of pressure like chest pain since earlier today after getting a cortisone this afternoon, she states she had shoulder surgery in July

## 2018-10-01 ENCOUNTER — Emergency Department (HOSPITAL_COMMUNITY)
Admission: EM | Admit: 2018-10-01 | Discharge: 2018-10-01 | Disposition: A | Discharge status: Home / Self Care | Payer: BC Managed Care – PPO | Attending: Emergency Medicine | Admitting: Emergency Medicine

## 2018-10-01 ENCOUNTER — Other Ambulatory Visit: Payer: Self-pay

## 2018-10-01 DIAGNOSIS — J449 Chronic obstructive pulmonary disease, unspecified: Secondary | ICD-10-CM

## 2018-10-01 DIAGNOSIS — R0789 Other chest pain: Secondary | ICD-10-CM

## 2018-10-01 LAB — CBC
HEMATOCRIT: 49.9 % — AB (ref 36.0–46.0)
HEMOGLOBIN: 15.3 g/dL — AB (ref 12.0–15.0)
MCH: 28.7 pg (ref 26.0–34.0)
MCHC: 30.7 g/dL (ref 30.0–36.0)
MCV: 93.6 fL (ref 80.0–100.0)
NRBC: 0 % (ref 0.0–0.2)
Platelets: 188 10*3/uL (ref 150–400)
RBC: 5.33 MIL/uL — ABNORMAL HIGH (ref 3.87–5.11)
RDW: 14.3 % (ref 11.5–15.5)
WBC: 5.8 10*3/uL (ref 4.0–10.5)

## 2018-10-01 LAB — BASIC METABOLIC PANEL
Anion gap: 12 (ref 5–15)
BUN: 16 mg/dL (ref 6–20)
CHLORIDE: 100 mmol/L (ref 98–111)
CO2: 23 mmol/L (ref 22–32)
Calcium: 9.6 mg/dL (ref 8.9–10.3)
Creatinine, Ser: 0.81 mg/dL (ref 0.44–1.00)
GFR calc Af Amer: >60 mL/min (ref >60)
GFR calc non Af Amer: >60 mL/min (ref >60)
Glucose, Bld: 340 mg/dL — ABNORMAL HIGH (ref 70–99)
Potassium: 4.4 mmol/L (ref 3.5–5.1)
SODIUM: 135 mmol/L (ref 135–145)

## 2018-10-01 LAB — POCT I-STAT TROPONIN I
TROPONIN I, POC: 0.01 ng/mL (ref 0.00–0.08)
Troponin i, poc: 0.01 ng/mL (ref 0.00–0.08)

## 2018-10-01 MED ORDER — ALBUTEROL SULFATE (5 MG/ML) 0.5% IN NEBU: 5.0000 mg | INHALATION_SOLUTION | Freq: Four times a day (QID) | RESPIRATORY_TRACT | 12 refills | Status: DC | PRN

## 2018-10-01 MED ORDER — OXYCODONE-ACETAMINOPHEN 5-325 MG PO TABS
1.0000 | ORAL_TABLET | Freq: Once | ORAL | Status: AC
  Administered 2018-10-01: 1 via ORAL
  Filled 2018-10-01: qty 1

## 2018-10-01 MED ORDER — OXYCODONE-ACETAMINOPHEN 5-325 MG PO TABS: 1.0000 | ORAL_TABLET | ORAL | 0 refills | Status: DC | PRN

## 2018-10-01 MED ORDER — ALBUTEROL SULFATE (2.5 MG/3ML) 0.083% IN NEBU
5.0000 mg | INHALATION_SOLUTION | Freq: Once | RESPIRATORY_TRACT | Status: AC
  Administered 2018-10-01: 5 mg via RESPIRATORY_TRACT
  Filled 2018-10-01: qty 6

## 2018-10-01 NOTE — ED Provider Notes (Signed)
ED ECG REPORT   Date: 09/30/2018 23:16  Rate: 117  Rhythm: sinus tachycardia  QRS Axis: normal  Intervals: normal  ST/T Wave abnormalities: nonspecific ST changes  Conduction Disutrbances:none   I have personally reviewed the EKG tracing and agree with the computerized printout as noted.    Ripley Fraise, MD 10/01/18 [phone]

## 2018-10-01 NOTE — Discharge Instructions (Signed)
Follow up with your doctor this week for recheck. Return here with any worsening symptoms or new concerns.

## 2018-10-01 NOTE — ED Provider Notes (Signed)
[facility] DEPT Provider Note   CSN: [id number] Arrival date & time: 09/30/18  2307     History   Chief Complaint No chief complaint on file.   HPI Sharon Swanson is a 58 y.o. female.  Patient to ED with complaint of chest pain that started yesterday immediately after receiving a steroid injection in her right shoulder by Dr. Tamera Punt (orthopedics) for chronic pain since shoulder surgery last July (2019). The pain in her chest is in the center and left chest, radiates to neck and through her breast to the lower chest. She reports the pain is constant without alleviating or aggravating factors. No SOB that she would associate with her chest pain, but she reports a history of COPD and being out of Albuterol for her nebulizer for the past 2 months. No fever or vomiting. No diaphoresis.  The history is provided by the patient. No language interpreter was used.    Past Medical History:  Diagnosis Date  . Acid reflux   . Asthma   . COPD (chronic obstructive pulmonary disease) ([place])   . Diabetes mellitus without complication ([place])    Type II  . Fatty liver 11/08/2011  . Fibroids 11/08/2011  . Hypertension   . Obesity (BMI 30-39.9) 11/08/2011  . Osteoarthritis of right acromioclavicular joint   . Pancreatitis 11/08/2011  . Rotator cuff tear, right   . Sleep apnea    Can not afford Cpap machine  . Ventral hernia     Patient Active Problem List   Diagnosis Date Noted  . Insulin-requiring or dependent type II diabetes mellitus ([place]) 01/19/2017  . Essential hypertension 01/19/2017  . Anxiety 01/19/2017  . Chest pain 01/19/2017  . OSA (obstructive sleep apnea) 01/19/2017  . COPD (chronic obstructive pulmonary disease) ([place])   . Acute respiratory failure ([place]) 08/17/2014  . Umbilical hernia 91/63/8466  . Pulmonary nodule, left 08/17/2014  . Constipation, chronic 08/15/2012  . GERD (gastroesophageal reflux disease) 11/08/2011  . Tobacco abuse  11/08/2011  . Fatty liver 11/08/2011  . BMI 45.0-49.9, adult ([place]) 11/08/2011  . Fibroids 11/08/2011    Past Surgical History:  Procedure Laterality Date  . ORTHOPEDIC SURGERY     R ankle, tendonitis     OB History    Gravida  0   Para  0   Term  0   Preterm  0   AB  0   Living  0     SAB  0   TAB  0   Ectopic  0   Multiple  0   Live Births               Home Medications    Prior to Admission medications   Medication Sig Start Date End Date Taking? Authorizing Provider  albuterol (PROVENTIL HFA;VENTOLIN HFA) 108 (90 Base) MCG/ACT inhaler Inhale 2 puffs into the lungs every 4 (four) hours as needed for wheezing or shortness of breath. 12/20/16   Mannam, Praveen, MD  albuterol (PROVENTIL) (2.5 MG/3ML) 0.083% nebulizer solution Take 3 mLs (2.5 mg total) by nebulization every 6 (six) hours as needed for wheezing or shortness of breath. 07/04/17   Varney Biles, MD  azithromycin (ZITHROMAX) 250 MG tablet Take 1 tablet (250 mg total) by mouth daily. Take first 2 tablets together, then 1 every day until finished. 07/04/17   Varney Biles, MD  budesonide-formoterol (SYMBICORT) 80-4.5 MCG/ACT inhaler Inhale 2 puffs into the lungs 2 (two) times daily.    [provider]  cloNIDine (CATAPRES) 0.1 MG tablet Take 0.1 mg by mouth 2 (two) times daily.    [provider]  docusate sodium (COLACE) 100 MG capsule Take 1 capsule (100 mg total) by mouth 3 (three) times daily as needed. 04/09/18   Grier Mitts, PA-C  fluticasone (FLONASE) 50 MCG/ACT nasal spray Place 1 spray into both nostrils daily. Patient taking differently: Place 1 spray into both nostrils daily as needed (for allergies.).  06/11/16   Hollice Gong, Mir Mohammed, MD  Fluticasone-Salmeterol (ADVAIR DISKUS) 250-50 MCG/DOSE AEPB Inhale 1 puff into the lungs 2 (two) times daily. 12/20/16   Mannam, Hart Robinsons, MD  gabapentin (NEURONTIN) 300 MG capsule Take 1 capsule (300 mg total) by mouth 3  (three) times daily. 07/02/18   Virgel Manifold, MD  glucose monitoring kit (FREESTYLE) monitoring kit 1 each by Does not apply route as needed for other. 06/10/16   Hollice Gong, Mir Earlie Server, MD  guaiFENesin (ROBITUSSIN) 100 MG/5ML SOLN Take 15 mLs (300 mg total) by mouth every 6 (six) hours as needed for cough or to loosen phlegm. 01/20/17   Johnson, Clanford L, MD  hydrochlorothiazide (MICROZIDE) 12.5 MG capsule Take 1 capsule (12.5 mg total) by mouth daily. 06/10/16   Hollice Gong, Mir Earlie Server, MD  ibuprofen (ADVIL,MOTRIN) 600 MG tablet Take 1 tablet (600 mg total) by mouth every 6 (six) hours as needed. 07/02/18   Virgel Manifold, MD  insulin aspart (NOVOLOG) 100 UNIT/ML injection Inject 5 Units into the skin 3 (three) times daily with meals. 06/10/16   Hollice Gong, Mir Mohammed, MD  insulin glargine (LANTUS) 100 UNIT/ML injection Inject 0.12 mLs (12 Units total) into the skin daily. 06/11/16   Hollice Gong, Mir Mohammed, MD  insulin glargine (LANTUS) 100 UNIT/ML injection Inject 12 Units into the skin at bedtime.    [provider]  ipratropium (ATROVENT) 0.02 % nebulizer solution Take 2.5 mLs (0.5 mg total) by nebulization 4 (four) times daily. 01/14/17   Charlesetta Shanks, MD  ipratropium-albuterol (DUONEB) 0.5-2.5 (3) MG/3ML SOLN Take 3 mLs by nebulization every 6 (six) hours. 08/22/14   Hosie Poisson, MD  liraglutide (VICTOZA) 18 MG/3ML SOPN Inject 0.6 mg into the skin daily. May increase up to 1.62m    [provider]  losartan-hydrochlorothiazide (HYZAAR) 100-25 MG tablet Take 1 tablet by mouth daily.    [provider]  meclizine (ANTIVERT) 25 MG tablet Take 1 tablet (25 mg total) by mouth 3 (three) times daily as needed for dizziness. 08/05/16   KLeo Grosser MD  metFORMIN (GLUCOPHAGE) 500 MG tablet Take 500 mg by mouth 2 (two) times daily with a meal. 04/27/16   [provider]  methocarbamol (ROBAXIN) 500 MG tablet Take 1 tablet (500 mg total) by mouth 3 (three) times  daily. 04/09/18   LGrier Mitts PA-C  mometasone-formoterol (DULERA) 100-5 MCG/ACT AERO Inhale 2 puffs into the lungs 2 (two) times daily. 07/04/17   NVarney Biles MD  morphine (MSIR) 15 MG tablet Take 1 tablet (15 mg total) by mouth every 4 (four) hours as needed for severe pain. 12/20/17   FDeno Etienne DO  oxyCODONE-acetaminophen (PERCOCET/ROXICET) 5-325 MG tablet Take 1-2 tablets by mouth every 6 (six) hours as needed for severe pain. 07/02/18   KVirgel Manifold MD  pravastatin (PRAVACHOL) 80 MG tablet Take 80 mg by mouth at bedtime.    [provider]  predniSONE (DELTASONE) 20 MG tablet Take 2 tablets (40 mg total) by mouth daily. 07/02/18   KVirgel Manifold MD  ranitidine (ZANTAC) 150 MG tablet Take 150 mg  by mouth 2 (two) times daily. 04/25/16 04/09/18  [provider]  sodium-potassium bicarbonate (ALKA-SELTZER GOLD) TBEF dissolvable tablet Take 1 tablet by mouth daily as needed.    [provider]  verapamil (CALAN-SR) 240 MG CR tablet Take 360 mg by mouth daily.  06/02/16   [provider]    Family History Family History  Problem Relation Age of Onset  . Hypertension Mother   . Diabetes Other     Social History Social History   Tobacco Use  . Smoking status: Current Every Day Smoker    Packs/day: 0.50    Types: Cigarettes  . Smokeless tobacco: Never Used  Substance Use Topics  . Alcohol use: No  . Drug use: No     Allergies   Aspirin   Review of Systems Review of Systems  Constitutional: Negative for chills and fever.  HENT: Negative.   Respiratory: Positive for wheezing. Negative for shortness of breath.   Cardiovascular: Positive for chest pain.  Gastrointestinal: Negative.  Negative for vomiting.  Musculoskeletal: Negative.        C/O right shoulder pain, chronic  Skin: Negative.   Neurological: Negative.  Negative for numbness.     Physical Exam Updated Vital Signs BP (!) 149/112 (BP Location: Left Arm)   Pulse (!)  101   Temp 98.9 F (37.2 C) (Oral)   Resp 16   Ht 5' 7"  (1.702 m)   Wt (!) 141.1 kg   SpO2 94%   BMI 48.73 kg/m   Physical Exam Vitals signs and nursing note reviewed.  Constitutional:      Appearance: She is well-developed.  HENT:     Head: Normocephalic.  Neck:     Musculoskeletal: Normal range of motion and neck supple.  Cardiovascular:     Rate and Rhythm: Normal rate and regular rhythm.     Heart sounds: No murmur.  Pulmonary:     Effort: Pulmonary effort is normal.     Breath sounds: Wheezing (Mild expiratory wheezing.) present. No rhonchi or rales.  Chest:     Chest wall: Tenderness present.  Abdominal:     General: Bowel sounds are normal.     Palpations: Abdomen is soft.     Tenderness: There is no abdominal tenderness. There is no guarding or rebound.  Musculoskeletal: Normal range of motion.  Skin:    General: Skin is warm and dry.     Findings: No rash.  Neurological:     General: No focal deficit present.     Mental Status: She is alert and oriented to person, place, and time.      ED Treatments / Results  Labs (all labs ordered are listed, but only abnormal results are displayed) Labs Reviewed  BASIC METABOLIC PANEL - Abnormal; Notable for the following components:      Result Value   Glucose, Bld 340 (*)    All other components within normal limits  CBC - Abnormal; Notable for the following components:   RBC 5.33 (*)    Hemoglobin 15.3 (*)    HCT 49.9 (*)    All other components within normal limits  I-STAT TROPONIN, ED  POCT I-STAT TROPONIN I  I-STAT TROPONIN, ED    EKG None  Radiology Dg Chest 2 View  Result Date: 10/01/2018 CLINICAL DATA:  Chest pain. Fever. EXAM: CHEST - 2 VIEW COMPARISON:  07/04/2017 FINDINGS: Unchanged cardiomegaly. Mediastinal contours are unchanged. Mild chronic bronchitic change. Focal airspace disease, pleural effusion, pneumothorax or pulmonary edema.  Degenerative change throughout spine. IMPRESSION: Unchanged  cardiomegaly and chronic bronchitic change. No superimposed acute process. Electronically Signed   By: Keith Rake M.D.   On: 10/01/2018 00:38    Procedures Procedures (including critical care time)  Medications Ordered in ED Medications  albuterol (PROVENTIL) (2.5 MG/3ML) 0.083% nebulizer solution 5 mg (has no administration in time range)  oxyCODONE-acetaminophen (PERCOCET/ROXICET) 5-325 MG per tablet 1 tablet (has no administration in time range)     Initial Impression / Assessment and Plan / ED Course  I have reviewed the triage vital signs and the nursing notes.  Pertinent labs & imaging results that were available during my care of the patient were reviewed by me and considered in my medical decision making (see chart for details).     Patient to ED with concern for chest pain that started after receiving a steroid injection in her shoulder yesterday. The pain is constant, radiates to neck and left lower chest. No SOB she feels is associated with the onset of pain. No vomiting or diaphoresis. No history of ischemic heart disease.   Chest pain is reproducible to palpation. It has been constant and is atypical for ACS. Delta troponin obtained due to risk factors and both 1st and 2nd tests are normal. She was given percocet for pain with relief.   Nebulizer provided with improvement in wheezing. She feels she is breathing better. Will provide Rx albuterol for nebulizer as she is out.   Encouraged her to follow up with her doctor for recheck this week. She is seen and evaluated by Dr. Christy Gentles who agrees with plan of discharge.    Final Clinical Impressions(s) / ED Diagnoses   Final diagnoses:  None   1. Chest wall pain 2. COPD  ED Discharge Orders    None       Charlann Lange, Hershal Coria 10/01/18 [id number]    Ripley Fraise, MD 10/01/18 [phone]

## 2018-10-01 NOTE — ED Notes (Signed)
Patient ambulatory to restroom with steady gait.

## 2018-10-01 NOTE — ED Provider Notes (Signed)
Patient seen/examined in the Emergency Department in conjunction with Midlevel Provider [place] Patient reports chest pain after recent right shoulder injection Exam : awake/alert, left sided chest wall tenderness Mild bilateral wheezing noted No distress Plan: d/c home Low suspicion for ACS/PE/Dissection at this time     Ripley Fraise, MD 10/01/18 [phone]

## 2019-01-27 DIAGNOSIS — R509 Fever, unspecified: Secondary | ICD-10-CM | POA: Insufficient documentation

## 2019-08-02 ENCOUNTER — Inpatient Hospital Stay (HOSPITAL_COMMUNITY): Payer: Self-pay

## 2019-08-02 ENCOUNTER — Other Ambulatory Visit: Payer: Self-pay

## 2019-08-02 ENCOUNTER — Encounter (HOSPITAL_COMMUNITY): Payer: Self-pay

## 2019-08-02 ENCOUNTER — Inpatient Hospital Stay (HOSPITAL_COMMUNITY)
Admission: EM | Admit: 2019-08-02 | Discharge: 2019-08-12 | DRG: 291 | Disposition: A | Discharge status: Home Health | Payer: Self-pay | Attending: Internal Medicine | Admitting: Internal Medicine

## 2019-08-02 ENCOUNTER — Emergency Department (HOSPITAL_COMMUNITY): Payer: Self-pay

## 2019-08-02 ENCOUNTER — Encounter (HOSPITAL_COMMUNITY): Payer: Self-pay | Admitting: Emergency Medicine

## 2019-08-02 DIAGNOSIS — J189 Pneumonia, unspecified organism: Secondary | ICD-10-CM | POA: Diagnosis present

## 2019-08-02 DIAGNOSIS — L0390 Cellulitis, unspecified: Secondary | ICD-10-CM | POA: Diagnosis present

## 2019-08-02 DIAGNOSIS — B370 Candidal stomatitis: Secondary | ICD-10-CM | POA: Diagnosis not present

## 2019-08-02 DIAGNOSIS — K760 Fatty (change of) liver, not elsewhere classified: Secondary | ICD-10-CM | POA: Diagnosis present

## 2019-08-02 DIAGNOSIS — Z6841 Body Mass Index (BMI) 40.0 and over, adult: Secondary | ICD-10-CM

## 2019-08-02 DIAGNOSIS — I5189 Other ill-defined heart diseases: Secondary | ICD-10-CM

## 2019-08-02 DIAGNOSIS — J9621 Acute and chronic respiratory failure with hypoxia: Secondary | ICD-10-CM | POA: Diagnosis present

## 2019-08-02 DIAGNOSIS — R0600 Dyspnea, unspecified: Secondary | ICD-10-CM

## 2019-08-02 DIAGNOSIS — Z7952 Long term (current) use of systemic steroids: Secondary | ICD-10-CM

## 2019-08-02 DIAGNOSIS — Z79899 Other long term (current) drug therapy: Secondary | ICD-10-CM

## 2019-08-02 DIAGNOSIS — I5033 Acute on chronic diastolic (congestive) heart failure: Secondary | ICD-10-CM | POA: Diagnosis present

## 2019-08-02 DIAGNOSIS — R05 Cough: Secondary | ICD-10-CM

## 2019-08-02 DIAGNOSIS — E1165 Type 2 diabetes mellitus with hyperglycemia: Secondary | ICD-10-CM | POA: Diagnosis not present

## 2019-08-02 DIAGNOSIS — Z8249 Family history of ischemic heart disease and other diseases of the circulatory system: Secondary | ICD-10-CM

## 2019-08-02 DIAGNOSIS — J9610 Chronic respiratory failure, unspecified whether with hypoxia or hypercapnia: Secondary | ICD-10-CM | POA: Diagnosis present

## 2019-08-02 DIAGNOSIS — Z20828 Contact with and (suspected) exposure to other viral communicable diseases: Secondary | ICD-10-CM | POA: Diagnosis present

## 2019-08-02 DIAGNOSIS — Z794 Long term (current) use of insulin: Secondary | ICD-10-CM

## 2019-08-02 DIAGNOSIS — R509 Fever, unspecified: Secondary | ICD-10-CM

## 2019-08-02 DIAGNOSIS — T380X5A Adverse effect of glucocorticoids and synthetic analogues, initial encounter: Secondary | ICD-10-CM | POA: Diagnosis not present

## 2019-08-02 DIAGNOSIS — K219 Gastro-esophageal reflux disease without esophagitis: Secondary | ICD-10-CM | POA: Diagnosis present

## 2019-08-02 DIAGNOSIS — J810 Acute pulmonary edema: Secondary | ICD-10-CM

## 2019-08-02 DIAGNOSIS — I2781 Cor pulmonale (chronic): Secondary | ICD-10-CM | POA: Diagnosis present

## 2019-08-02 DIAGNOSIS — E662 Morbid (severe) obesity with alveolar hypoventilation: Secondary | ICD-10-CM | POA: Diagnosis present

## 2019-08-02 DIAGNOSIS — F17210 Nicotine dependence, cigarettes, uncomplicated: Secondary | ICD-10-CM | POA: Diagnosis present

## 2019-08-02 DIAGNOSIS — I509 Heart failure, unspecified: Secondary | ICD-10-CM

## 2019-08-02 DIAGNOSIS — R0789 Other chest pain: Secondary | ICD-10-CM

## 2019-08-02 DIAGNOSIS — I444 Left anterior fascicular block: Secondary | ICD-10-CM | POA: Diagnosis present

## 2019-08-02 DIAGNOSIS — R059 Cough, unspecified: Secondary | ICD-10-CM

## 2019-08-02 DIAGNOSIS — E119 Type 2 diabetes mellitus without complications: Secondary | ICD-10-CM

## 2019-08-02 DIAGNOSIS — R609 Edema, unspecified: Secondary | ICD-10-CM

## 2019-08-02 DIAGNOSIS — Z886 Allergy status to analgesic agent status: Secondary | ICD-10-CM

## 2019-08-02 DIAGNOSIS — L03115 Cellulitis of right lower limb: Secondary | ICD-10-CM | POA: Diagnosis present

## 2019-08-02 DIAGNOSIS — J9601 Acute respiratory failure with hypoxia: Secondary | ICD-10-CM

## 2019-08-02 DIAGNOSIS — Z7951 Long term (current) use of inhaled steroids: Secondary | ICD-10-CM

## 2019-08-02 DIAGNOSIS — J440 Chronic obstructive pulmonary disease with acute lower respiratory infection: Secondary | ICD-10-CM | POA: Diagnosis present

## 2019-08-02 DIAGNOSIS — J441 Chronic obstructive pulmonary disease with (acute) exacerbation: Secondary | ICD-10-CM | POA: Diagnosis present

## 2019-08-02 DIAGNOSIS — I161 Hypertensive emergency: Secondary | ICD-10-CM | POA: Diagnosis present

## 2019-08-02 DIAGNOSIS — J449 Chronic obstructive pulmonary disease, unspecified: Secondary | ICD-10-CM | POA: Diagnosis present

## 2019-08-02 DIAGNOSIS — Z833 Family history of diabetes mellitus: Secondary | ICD-10-CM

## 2019-08-02 DIAGNOSIS — R0902 Hypoxemia: Secondary | ICD-10-CM

## 2019-08-02 DIAGNOSIS — I10 Essential (primary) hypertension: Secondary | ICD-10-CM | POA: Diagnosis present

## 2019-08-02 DIAGNOSIS — I110 Hypertensive heart disease with heart failure: Principal | ICD-10-CM | POA: Diagnosis present

## 2019-08-02 LAB — CREATININE, SERUM
Creatinine, Ser: 0.55 mg/dL (ref 0.44–1.00)
GFR calc Af Amer: >60 mL/min (ref >60)
GFR calc non Af Amer: >60 mL/min (ref >60)

## 2019-08-02 LAB — URINALYSIS, ROUTINE W REFLEX MICROSCOPIC
Bacteria, UA: NONE SEEN
Bilirubin Urine: NEGATIVE
Glucose, UA: NEGATIVE mg/dL
Hgb urine dipstick: NEGATIVE
Ketones, ur: NEGATIVE mg/dL
Leukocytes,Ua: NEGATIVE
Nitrite: NEGATIVE
Protein, ur: 100 mg/dL — AB
Specific Gravity, Urine: 1.005 (ref 1.005–1.030)
pH: 7 (ref 5.0–8.0)

## 2019-08-02 LAB — CBC
HCT: 54.4 % — ABNORMAL HIGH (ref 36.0–46.0)
Hemoglobin: 16.6 g/dL — ABNORMAL HIGH (ref 12.0–15.0)
MCH: 27.9 pg (ref 26.0–34.0)
MCHC: 30.5 g/dL (ref 30.0–36.0)
MCV: 91.4 fL (ref 80.0–100.0)
Platelets: 172 10*3/uL (ref 150–400)
RBC: 5.95 MIL/uL — ABNORMAL HIGH (ref 3.87–5.11)
RDW: 15.6 % — ABNORMAL HIGH (ref 11.5–15.5)
WBC: 4.8 10*3/uL (ref 4.0–10.5)
nRBC: 0 % (ref 0.0–0.2)

## 2019-08-02 LAB — BASIC METABOLIC PANEL
Anion gap: 10 (ref 5–15)
BUN: 11 mg/dL (ref 6–20)
CO2: 30 mmol/L (ref 22–32)
Calcium: 9 mg/dL (ref 8.9–10.3)
Chloride: 100 mmol/L (ref 98–111)
Creatinine, Ser: 0.56 mg/dL (ref 0.44–1.00)
GFR calc Af Amer: >60 mL/min (ref >60)
GFR calc non Af Amer: >60 mL/min (ref >60)
Glucose, Bld: 133 mg/dL — ABNORMAL HIGH (ref 70–99)
Potassium: 4.3 mmol/L (ref 3.5–5.1)
Sodium: 140 mmol/L (ref 135–145)

## 2019-08-02 LAB — HEMOGLOBIN A1C
Hgb A1c MFr Bld: 8.4 % — ABNORMAL HIGH (ref 4.8–5.6)
Mean Plasma Glucose: 194.38 mg/dL

## 2019-08-02 LAB — CBC WITH DIFFERENTIAL/PLATELET
Abs Immature Granulocytes: 0.01 10*3/uL (ref 0.00–0.07)
Basophils Absolute: 0 10*3/uL (ref 0.0–0.1)
Basophils Relative: 1 %
Eosinophils Absolute: 0.1 10*3/uL (ref 0.0–0.5)
Eosinophils Relative: 2 %
HCT: 53.7 % — ABNORMAL HIGH (ref 36.0–46.0)
Hemoglobin: 16.3 g/dL — ABNORMAL HIGH (ref 12.0–15.0)
Immature Granulocytes: 0 %
Lymphocytes Relative: 21 %
Lymphs Abs: 1 10*3/uL (ref 0.7–4.0)
MCH: 28.2 pg (ref 26.0–34.0)
MCHC: 30.4 g/dL (ref 30.0–36.0)
MCV: 92.7 fL (ref 80.0–100.0)
Monocytes Absolute: 0.5 10*3/uL (ref 0.1–1.0)
Monocytes Relative: 11 %
Neutro Abs: 3 10*3/uL (ref 1.7–7.7)
Neutrophils Relative %: 65 %
Platelets: 170 10*3/uL (ref 150–400)
RBC: 5.79 MIL/uL — ABNORMAL HIGH (ref 3.87–5.11)
RDW: 15.6 % — ABNORMAL HIGH (ref 11.5–15.5)
WBC: 4.6 10*3/uL (ref 4.0–10.5)
nRBC: 0 % (ref 0.0–0.2)

## 2019-08-02 LAB — TROPONIN I (HIGH SENSITIVITY)
Troponin I (High Sensitivity): 7 ng/L (ref <18)
Troponin I (High Sensitivity): 7 ng/L (ref <18)

## 2019-08-02 LAB — SARS CORONAVIRUS 2 (TAT 6-24 HRS): SARS Coronavirus 2: NEGATIVE

## 2019-08-02 LAB — D-DIMER, QUANTITATIVE: D-Dimer, Quant: 0.82 ug/mL-FEU — ABNORMAL HIGH (ref 0.00–0.50)

## 2019-08-02 LAB — ECHOCARDIOGRAM COMPLETE
Height: 67 in
Weight: 5054.71 oz

## 2019-08-02 LAB — GLUCOSE, CAPILLARY
Glucose-Capillary: 226 mg/dL — ABNORMAL HIGH (ref 70–99)
Glucose-Capillary: 182 mg/dL — ABNORMAL HIGH (ref 70–99)

## 2019-08-02 LAB — BRAIN NATRIURETIC PEPTIDE: B Natriuretic Peptide: 119.1 pg/mL — ABNORMAL HIGH (ref 0.0–100.0)

## 2019-08-02 LAB — HIV ANTIBODY (ROUTINE TESTING W REFLEX): HIV Screen 4th Generation wRfx: NONREACTIVE

## 2019-08-02 LAB — CBG MONITORING, ED: Glucose-Capillary: 129 mg/dL — ABNORMAL HIGH (ref 70–99)

## 2019-08-02 MED ORDER — IPRATROPIUM-ALBUTEROL 0.5-2.5 (3) MG/3ML IN SOLN
3.0000 mL | RESPIRATORY_TRACT | Status: DC | PRN
  Administered 2019-08-02: 16:00:00 3 mL via RESPIRATORY_TRACT
  Filled 2019-08-02: qty 3

## 2019-08-02 MED ORDER — INSULIN ASPART 100 UNIT/ML ~~LOC~~ SOLN
0.0000 [IU] | Freq: Every day | SUBCUTANEOUS | Status: DC
  Administered 2019-08-03 – 2019-08-04 (×2): 2 [IU] via SUBCUTANEOUS
  Administered 2019-08-05: 3 [IU] via SUBCUTANEOUS
  Administered 2019-08-06: 22:00:00 4 [IU] via SUBCUTANEOUS
  Administered 2019-08-07: 3 [IU] via SUBCUTANEOUS
  Administered 2019-08-09 – 2019-08-11 (×2): 2 [IU] via SUBCUTANEOUS

## 2019-08-02 MED ORDER — INSULIN GLARGINE 100 UNIT/ML ~~LOC~~ SOLN
10.0000 [IU] | Freq: Every day | SUBCUTANEOUS | Status: DC
  Administered 2019-08-02: 10 [IU] via SUBCUTANEOUS
  Filled 2019-08-02: qty 0.1

## 2019-08-02 MED ORDER — INSULIN ASPART 100 UNIT/ML ~~LOC~~ SOLN
0.0000 [IU] | Freq: Three times a day (TID) | SUBCUTANEOUS | Status: DC
  Administered 2019-08-02: 5 [IU] via SUBCUTANEOUS
  Administered 2019-08-03 – 2019-08-04 (×3): 3 [IU] via SUBCUTANEOUS
  Administered 2019-08-04: 2 [IU] via SUBCUTANEOUS
  Administered 2019-08-04: 3 [IU] via SUBCUTANEOUS
  Administered 2019-08-05: 8 [IU] via SUBCUTANEOUS
  Administered 2019-08-05: 2 [IU] via SUBCUTANEOUS
  Administered 2019-08-05: 5 [IU] via SUBCUTANEOUS
  Administered 2019-08-06: 8 [IU] via SUBCUTANEOUS
  Administered 2019-08-06: 5 [IU] via SUBCUTANEOUS
  Administered 2019-08-06: 15 [IU] via SUBCUTANEOUS

## 2019-08-02 MED ORDER — LOSARTAN POTASSIUM 25 MG PO TABS
25.0000 mg | ORAL_TABLET | Freq: Every day | ORAL | Status: DC
  Administered 2019-08-02 – 2019-08-04 (×3): 25 mg via ORAL
  Filled 2019-08-02 (×3): qty 1

## 2019-08-02 MED ORDER — POLYVINYL ALCOHOL 1.4 % OP SOLN: 1.0000 [drp] | OPHTHALMIC | Status: DC | PRN

## 2019-08-02 MED ORDER — VARENICLINE TARTRATE 0.5 MG PO TABS
0.5000 mg | ORAL_TABLET | Freq: Every day | ORAL | Status: DC
  Administered 2019-08-02 – 2019-08-12 (×11): 0.5 mg via ORAL
  Filled 2019-08-02 (×11): qty 1

## 2019-08-02 MED ORDER — SODIUM CHLORIDE (PF) 0.9 % IJ SOLN
INTRAMUSCULAR | Status: AC
  Filled 2019-08-02: qty 50

## 2019-08-02 MED ORDER — ONDANSETRON HCL 4 MG/2ML IJ SOLN: 4.0000 mg | Freq: Four times a day (QID) | INTRAMUSCULAR | Status: DC | PRN

## 2019-08-02 MED ORDER — SODIUM CHLORIDE 0.9% FLUSH
3.0000 mL | Freq: Two times a day (BID) | INTRAVENOUS | Status: DC
  Administered 2019-08-02 – 2019-08-12 (×19): 3 mL via INTRAVENOUS

## 2019-08-02 MED ORDER — FUROSEMIDE 10 MG/ML IJ SOLN
40.0000 mg | Freq: Once | INTRAMUSCULAR | Status: AC
  Administered 2019-08-02: 40 mg via INTRAVENOUS
  Filled 2019-08-02: qty 4

## 2019-08-02 MED ORDER — SODIUM CHLORIDE 0.9 % IV SOLN: 250.0000 mL | INTRAVENOUS | Status: DC | PRN

## 2019-08-02 MED ORDER — IOHEXOL 350 MG/ML SOLN
100.0000 mL | Freq: Once | INTRAVENOUS | Status: AC | PRN
  Administered 2019-08-02: 18:00:00 100 mL via INTRAVENOUS

## 2019-08-02 MED ORDER — LABETALOL HCL 5 MG/ML IV SOLN
20.0000 mg | Freq: Once | INTRAVENOUS | Status: AC
  Administered 2019-08-02: 07:00:00 20 mg via INTRAVENOUS
  Filled 2019-08-02: qty 4

## 2019-08-02 MED ORDER — FLUTICASONE PROPIONATE 50 MCG/ACT NA SUSP
1.0000 | Freq: Every day | NASAL | Status: DC
  Administered 2019-08-02 – 2019-08-12 (×11): 1 via NASAL
  Filled 2019-08-02: qty 16

## 2019-08-02 MED ORDER — CARVEDILOL 3.125 MG PO TABS
3.1250 mg | ORAL_TABLET | Freq: Two times a day (BID) | ORAL | Status: DC
  Administered 2019-08-02 – 2019-08-03 (×2): 3.125 mg via ORAL
  Filled 2019-08-02 (×2): qty 1

## 2019-08-02 MED ORDER — ALBUTEROL SULFATE HFA 108 (90 BASE) MCG/ACT IN AERS
8.0000 | INHALATION_SPRAY | Freq: Once | RESPIRATORY_TRACT | Status: AC
  Administered 2019-08-02: 07:00:00 8 via RESPIRATORY_TRACT
  Filled 2019-08-02: qty 6.7

## 2019-08-02 MED ORDER — GABAPENTIN 300 MG PO CAPS
300.0000 mg | ORAL_CAPSULE | Freq: Three times a day (TID) | ORAL | Status: DC
  Administered 2019-08-02 – 2019-08-12 (×30): 300 mg via ORAL
  Filled 2019-08-02 (×30): qty 1

## 2019-08-02 MED ORDER — FUROSEMIDE 10 MG/ML IJ SOLN
40.0000 mg | Freq: Two times a day (BID) | INTRAMUSCULAR | Status: DC
  Administered 2019-08-02 – 2019-08-07 (×10): 40 mg via INTRAVENOUS
  Filled 2019-08-02 (×11): qty 4

## 2019-08-02 MED ORDER — ENOXAPARIN SODIUM 60 MG/0.6ML ~~LOC~~ SOLN
60.0000 mg | SUBCUTANEOUS | Status: DC
  Administered 2019-08-02 – 2019-08-11 (×10): 60 mg via SUBCUTANEOUS
  Filled 2019-08-02 (×10): qty 0.6

## 2019-08-02 MED ORDER — ACETAMINOPHEN 325 MG PO TABS
650.0000 mg | ORAL_TABLET | ORAL | Status: DC | PRN
  Administered 2019-08-03 – 2019-08-12 (×17): 650 mg via ORAL
  Filled 2019-08-02 (×17): qty 2

## 2019-08-02 MED ORDER — CEPHALEXIN 500 MG PO CAPS
500.0000 mg | ORAL_CAPSULE | Freq: Four times a day (QID) | ORAL | Status: DC
  Administered 2019-08-02 – 2019-08-04 (×7): 500 mg via ORAL
  Filled 2019-08-02 (×7): qty 1

## 2019-08-02 MED ORDER — SODIUM CHLORIDE 0.9% FLUSH
3.0000 mL | INTRAVENOUS | Status: DC | PRN
  Administered 2019-08-08: 3 mL via INTRAVENOUS
  Filled 2019-08-02: qty 3

## 2019-08-02 MED ORDER — AMLODIPINE BESYLATE 5 MG PO TABS
10.0000 mg | ORAL_TABLET | Freq: Once | ORAL | Status: AC
  Administered 2019-08-02: 10 mg via ORAL
  Filled 2019-08-02: qty 2

## 2019-08-02 NOTE — ED Provider Notes (Signed)
TIME SEEN: 6:09 AM  CHIEF COMPLAINT: Shortness of breath  HPI: Patient is a 58 year old female with history of hypertension, diabetes, COPD, asthma who presents to the emergency department with 2 weeks of worsening shortness of breath with exertion, orthopnea, bilateral lower extremity swelling.  Has had intermittent chest tightness but none currently.  Reports she thinks she had a fever of "100 something" tonight with cough.  No nausea or vomiting.  Has had diarrhea.  No Covid exposures.  States she has not had her blood pressure medication in many weeks.  She does not remember what she is supposed to be taking.  ROS: See HPI Constitutional:  fever  Eyes: no drainage  ENT: no runny nose   Cardiovascular:   chest pain  Resp:  SOB  GI: no vomiting GU: no dysuria Integumentary: no rash  Allergy: no hives  Musculoskeletal: no leg swelling  Neurological: no slurred speech ROS otherwise negative  PAST MEDICAL HISTORY/PAST SURGICAL HISTORY:  Past Medical History:  Diagnosis Date  . Acid reflux   . Asthma   . COPD (chronic obstructive pulmonary disease) ([place])   . Diabetes mellitus without complication ([place])    Type II  . Fatty liver 11/08/2011  . Fibroids 11/08/2011  . Hypertension   . Obesity (BMI 30-39.9) 11/08/2011  . Osteoarthritis of right acromioclavicular joint   . Pancreatitis 11/08/2011  . Rotator cuff tear, right   . Sleep apnea    Can not afford Cpap machine  . Ventral hernia     MEDICATIONS:  Prior to Admission medications   Medication Sig Start Date End Date Taking? Authorizing Provider  albuterol (PROVENTIL HFA;VENTOLIN HFA) 108 (90 Base) MCG/ACT inhaler Inhale 2 puffs into the lungs every 4 (four) hours as needed for wheezing or shortness of breath. 12/20/16   Mannam, Praveen, MD  albuterol (PROVENTIL) (2.5 MG/3ML) 0.083% nebulizer solution Take 3 mLs (2.5 mg total) by nebulization every 6 (six) hours as needed for wheezing or shortness of breath. 07/04/17   Varney Biles, MD  albuterol (PROVENTIL) (5 MG/ML) 0.5% nebulizer solution Take 1 mL (5 mg total) by nebulization every 6 (six) hours as needed for wheezing or shortness of breath. 10/01/18   Charlann Lange, PA-C  azithromycin (ZITHROMAX) 250 MG tablet Take 1 tablet (250 mg total) by mouth daily. Take first 2 tablets together, then 1 every day until finished. Patient not taking: Reported on 10/01/2018 07/04/17   Varney Biles, MD  budesonide-formoterol [facility]) 80-4.5 MCG/ACT inhaler Inhale 2 puffs into the lungs 2 (two) times daily.    [provider]  cloNIDine (CATAPRES) 0.1 MG tablet Take 0.1 mg by mouth 2 (two) times daily.    [provider]  docusate sodium (COLACE) 100 MG capsule Take 1 capsule (100 mg total) by mouth 3 (three) times daily as needed. Patient not taking: Reported on 10/01/2018 04/09/18   Grier Mitts, PA-C  fluticasone [facility]) 50 MCG/ACT nasal spray Place 1 spray into both nostrils daily. Patient taking differently: Place 1 spray into both nostrils daily as needed (for allergies.).  06/11/16   Hollice Gong, Mir Mohammed, MD  Fluticasone-Salmeterol (ADVAIR DISKUS) 250-50 MCG/DOSE AEPB Inhale 1 puff into the lungs 2 (two) times daily. Patient not taking: Reported on 10/01/2018 12/20/16   Marshell Garfinkel, MD  gabapentin (NEURONTIN) 300 MG capsule Take 1 capsule (300 mg total) by mouth 3 (three) times daily. 07/02/18   Virgel Manifold, MD  glucose monitoring kit (FREESTYLE) monitoring kit 1 each by Does not apply route as needed  for other. 06/10/16   Hollice Gong, Mir Earlie Server, MD  guaiFENesin (ROBITUSSIN) 100 MG/5ML SOLN Take 15 mLs (300 mg total) by mouth every 6 (six) hours as needed for cough or to loosen phlegm. Patient not taking: Reported on 10/01/2018 01/20/17   Murlean Iba, MD  hydrochlorothiazide (MICROZIDE) 12.5 MG capsule Take 1 capsule (12.5 mg total) by mouth daily. Patient not taking: Reported on 10/01/2018 06/10/16   Tomma Rakers, MD   ibuprofen (ADVIL,MOTRIN) 600 MG tablet Take 1 tablet (600 mg total) by mouth every 6 (six) hours as needed. Patient not taking: Reported on 10/01/2018 07/02/18   Virgel Manifold, MD  insulin aspart (NOVOLOG) 100 UNIT/ML injection Inject 5 Units into the skin 3 (three) times daily with meals. Patient not taking: Reported on 10/01/2018 06/10/16   Hollice Gong, Mir Mohammed, MD  insulin glargine (LANTUS) 100 UNIT/ML injection Inject 0.12 mLs (12 Units total) into the skin daily. Patient not taking: Reported on 10/01/2018 06/11/16   Hollice Gong, Mir Mohammed, MD  insulin glargine (LANTUS) 100 UNIT/ML injection Inject 12 Units into the skin at bedtime.    [provider]  ipratropium (ATROVENT) 0.02 % nebulizer solution Take 2.5 mLs (0.5 mg total) by nebulization 4 (four) times daily. Patient not taking: Reported on 10/01/2018 01/14/17   Charlesetta Shanks, MD  ipratropium-albuterol (DUONEB) 0.5-2.5 (3) MG/3ML SOLN Take 3 mLs by nebulization every 6 (six) hours. Patient not taking: Reported on 10/01/2018 08/22/14   Hosie Poisson, MD  liraglutide (VICTOZA) 18 MG/3ML SOPN Inject 0.6 mg into the skin daily. May increase up to 1.32m    [provider]  losartan-hydrochlorothiazide (HYZAAR) 100-25 MG tablet Take 1 tablet by mouth daily.    [provider]  meclizine (ANTIVERT) 25 MG tablet Take 1 tablet (25 mg total) by mouth 3 (three) times daily as needed for dizziness. Patient not taking: Reported on 10/01/2018 08/05/16   KLeo Grosser MD  metFORMIN (GLUCOPHAGE) 500 MG tablet Take 500 mg by mouth 2 (two) times daily with a meal. 04/27/16   [provider]  methocarbamol (ROBAXIN) 500 MG tablet Take 1 tablet (500 mg total) by mouth 3 (three) times daily. Patient not taking: Reported on 10/01/2018 04/09/18   LGrier Mitts PA-C  mometasone-formoterol (DULERA) 100-5 MCG/ACT AERO Inhale 2 puffs into the lungs 2 (two) times daily. Patient not taking: Reported on 10/01/2018 07/04/17   NVarney Biles MD  morphine (MSIR) 15 MG tablet Take 1 tablet (15 mg total) by mouth every 4 (four) hours as needed for severe pain. Patient not taking: Reported on 10/01/2018 12/20/17   FDeno Etienne DO  oxyCODONE-acetaminophen (PERCOCET/ROXICET) 5-325 MG tablet Take 1 tablet by mouth every 4 (four) hours as needed for severe pain. 10/01/18   UCharlann Lange PA-C  pravastatin (PRAVACHOL) 80 MG tablet Take 80 mg by mouth at bedtime.    [provider]  predniSONE (DELTASONE) 20 MG tablet Take 2 tablets (40 mg total) by mouth daily. Patient not taking: Reported on 10/01/2018 07/02/18   KVirgel Manifold MD  ranitidine (ZANTAC) 150 MG tablet Take 150 mg by mouth 2 (two) times daily. 04/25/16 10/01/18  [provider]  verapamil (VERELAN PM) 360 MG 24 hr capsule Take 360 mg by mouth at bedtime. 02/06/17 03/04/19  [provider]    ALLERGIES:  Allergies  Allergen Reactions  . Aspirin Other (See Comments)    Abdominal pain    SOCIAL HISTORY:  Social History   Tobacco Use  . Smoking status: Current Every Day Smoker  Packs/day: 0.50    Types: Cigarettes  . Smokeless tobacco: Never Used  Substance Use Topics  . Alcohol use: No    FAMILY HISTORY: Family History  Problem Relation Age of Onset  . Hypertension Mother   . Diabetes Other     EXAM: BP (!) 201/108 (BP Location: Right Arm)   Pulse (!) 110   Temp 98.6 F (37 C) (Oral)   Resp (!) 22   Ht _0  (1.702 m)   Wt (!) 147.4 kg   SpO2 92%   BMI 50.90 kg/m  CONSTITUTIONAL: Alert and oriented and responds appropriately to questions.  Obese, appears short of breath with ambulation HEAD: Normocephalic EYES: Conjunctivae clear, pupils appear equal, EOMI ENT: normal nose; moist mucous membranes NECK: Supple, no meningismus, no nuchal rigidity, no LAD  CARD: Regular and tachycardic; S1 and S2 appreciated; no murmurs, no clicks, no rubs, no gallops RESP: Patient is intermittently tachypneic worse with exertion.  Bibasilar  rales on exam.  No rhonchi or wheezing.  No hypoxia on room air. ABD/GI: Normal bowel sounds; non-distended; soft, non-tender, no rebound, no guarding, no peritoneal signs, no hepatosplenomegaly BACK:  The back appears normal and is non-tender to palpation, there is no CVA tenderness EXT: Peripheral nonpitting edema in bilateral lower extremities to the level of the knee.  2+ DP pulses bilaterally.  No redness or warmth noted.  Compartments soft.  No calf tenderness on exam. SKIN: Normal color for age and race; warm; no rash NEURO: Moves all extremities equally PSYCH: The patient's mood and manner are appropriate. Grooming and personal hygiene are appropriate.  MEDICAL DECISION MAKING: Patient here with hypertensive urgency/emergency.  Appears volume overloaded on exam.  Blood pressure, heart rate, respiratory rate elevated.  Will give IV labetalol, IV Lasix, obtain labs, chest x-ray and Covid swab.  Anticipate admission.  ED PROGRESS: Chest x-ray shows ill-defined opacity within the mid to lower lung fields likely pulmonary edema but infection is not excluded.  She does report fever at home but did not take any medication prior to arrival.  Rectal temperature here 98.8.  After checking her rectal temperature with having her lie flat for only 30 seconds, patient became increasingly short of breath and started wheezing.  Provided albuterol inhaler.  Sats 88% sitting upright in the room on room air at rest.  Placed on 2 L nasal cannula.  States she is supposed to be on oxygen but when she lost her insurance it was not provided for her.  She is still a smoker.  No history of PE or DVT.  Rectal temp normal.  No leukocytosis.  Doubt infectious etiology.  Covid pending.  Troponin and BNP pending.  Urine shows proteinuria but no hematuria.  Patient will need admission for hypertensive emergency.  She has received labetalol and Lasix and her blood pressure is improving.  We will also give oral amlodipine.  It  appears she previously was on multiple blood pressure medications but she cannot tell me if these were all at the same time, different times or when the last time she took them.  Signed out to Dr. Maryan Rued to follow-up on patient's labs and admit patient to the hospital.  Patient states she currently does not have a primary care provider.  It was previously Eloise Levels, nurse practitioner.   I reviewed all nursing notes and pertinent previous records as available.  I have interpreted any EKGs, lab and urine results, imaging (as available).   CRITICAL CARE Performed by: Cyril Mourning  Jonell Brumbaugh   Total critical care time: 53 minutes  Critical care time was exclusive of separately billable procedures and treating other patients.  Critical care was necessary to treat or prevent imminent or life-threatening deterioration.  Critical care was time spent personally by me on the following activities: development of treatment plan with patient and/or surrogate as well as nursing, discussions with consultants, evaluation of patient's response to treatment, examination of patient, obtaining history from patient or surrogate, ordering and performing treatments and interventions, ordering and review of laboratory studies, ordering and review of radiographic studies, pulse oximetry and re-evaluation of patient's condition.   Makesha Belitz was evaluated in Emergency Department on 08/02/2019 for the symptoms described in the history of present illness. She was evaluated in the context of the global COVID-19 pandemic, which necessitated consideration that the patient might be at risk for infection with the SARS-CoV-2 virus that causes COVID-19. Institutional protocols and algorithms that pertain to the evaluation of patients at risk for COVID-19 are in a state of rapid change based on information released by regulatory bodies including the CDC and federal and state organizations. These policies and algorithms were  followed during the patient's care in the ED.    EKG Interpretation  Date/Time:  Saturday August 02 2019 05:32:26 EST Ventricular Rate:  99 PR Interval:    QRS Duration: 92 QT Interval:  376 QTC Calculation: 483 R Axis:   -62 Text Interpretation: Sinus rhythm Multiple premature complexes, vent & supraven Aberrant conduction of SV complex(es) LAE, consider biatrial enlargement Left anterior fascicular block Anteroseptal infarct, old Baseline wander in lead(s) V4 Poor quality - need repeat Confirmed by Pryor Curia [phone]) on 08/02/2019 6:09:01 AM         EKG Interpretation  Date/Time:  Saturday August 02 2019 05:47:17 EST Ventricular Rate:  102 PR Interval:    QRS Duration: 83 QT Interval:  368 QTC Calculation: 480 R Axis:   -60 Text Interpretation: Sinus tachycardia Left anterior fascicular block Borderline T abnormalities, lateral leads No significant change since last tracing in January 2020 Confirmed by Pryor Curia [phone]) on 08/02/2019 6:09:43 AM          Karimah Winquist, Delice Bison, DO 08/02/19 [id number]

## 2019-08-02 NOTE — Progress Notes (Signed)
*  PRELIMINARY RESULTS* Echocardiogram 2D Echocardiogram has been performed.  Leavy Cella 08/02/2019, 3:25 PM

## 2019-08-02 NOTE — ED Triage Notes (Signed)
Complains of dyspnea upon exertion, shortness of breath, and bilateral lower leg swelling. Patient reports taking her water pill, but not her blood pressure medication because her insurance ran out. Denies N/V/D or chest pain. +2 pitting edema bilaterally, pedal pulses +2.

## 2019-08-02 NOTE — ED Notes (Signed)
RN Jillyn Ledger is providing care to another patient at this time and will contact RN Shannon Balthazar ASAP once completed care is finished in order to receive report form RN Ali Lowe.

## 2019-08-02 NOTE — H&P (Signed)
History and Physical        Hospital Admission Note Date: 08/02/2019  Patient name: Sharon Swanson Medical record number: [id number] Date of birth: [birth year] Age: 58 y.o. Gender: female  PCP: Eloise Levels, NP    Patient coming from: Home  I have reviewed all records in the [facility].    Chief Complaint:  Shortness of breath, worse with exertion, orthopnea, leg swelling for last 2 weeks  HPI: Patient is a 58 year old female with history of hypertension, COPD, diabetes mellitus, IDDM, smoking, just quit presented to ED with 2 weeks of worsening shortness of breath.  Per patient, she had stopped taking her medication 2 weeks ago as she lost her insurance.  She started noticing shortness of breath, worse with exertion, also has not been able to sleep, has been sitting up on the couch for last 2 weeks.  She also noticed swelling in her both lower legs, worsening. Right lower leg was becoming more tight, tender and red.  Patient reported that she had a fever of 101 F last night with coughing.  She also reports chest tightness with exertion.  No nausea or vomiting, stated she had diarrhea last week. No recent long distance travels.  Patient has been home due to work injury since April 2020. Patient reported that she has no Covid exposures, husband had Covid test done few days ago for the procedure and it was negative.   ED work-up/course:  Temp 98.6, respiratory 22, pulse 110, BP 201/108 at the time of triage Sodium 140, potassium 4.3, BUN 11, creatinine 0.56 Hemoglobin 16.3, hematocrit 53.7, troponin 7, BNP 119.1 EKG sinus tachycardia, no acute ST-T wave changes suggestive of ischemia, QTC 480 Patient was given IV labetalol, Norvasc, IV Lasix in ED  Review of Systems: Positives marked in 'bold' Constitutional: + fever, chills, poor appetite and fatigue.  HEENT: Denies  photophobia, eye pain, redness, hearing loss, ear pain, congestion, sore throat, rhinorrhea, sneezing, mouth sores, trouble swallowing, neck pain, neck stiffness and tinnitus.   Respiratory: See HPI Cardiovascular: Please see HPI Gastrointestinal: Denies nausea, vomiting, abdominal pain, blood in stool and abdominal distention. + Diarrhea Genitourinary: Denies dysuria, urgency, frequency, hematuria, flank pain and difficulty urinating.  Musculoskeletal: Denies myalgias, back pain, joint swelling, arthralgias and gait problem.  Skin: Denies pallor, rash and wound.  Neurological: Denies dizziness, seizures, syncope, weakness, light-headedness, numbness and headaches.  Hematological: Denies adenopathy. Easy bruising, personal or family bleeding history  Psychiatric/Behavioral: Denies suicidal ideation, mood changes, confusion, nervousness, sleep disturbance and agitation  Past Medical History: Past Medical History:  Diagnosis Date  . Acid reflux   . Asthma   . COPD (chronic obstructive pulmonary disease) ([place])   . Diabetes mellitus without complication ([place])    Type II  . Fatty liver 11/08/2011  . Fibroids 11/08/2011  . Hypertension   . Obesity (BMI 30-39.9) 11/08/2011  . Osteoarthritis of right acromioclavicular joint   . Pancreatitis 11/08/2011  . Rotator cuff tear, right   . Sleep apnea    Can not afford Cpap machine  . Ventral hernia     Past Surgical History:  Procedure Laterality Date  . ORTHOPEDIC SURGERY  R ankle, tendonitis    Medications: Prior to Admission medications   Medication Sig Start Date End Date Taking? Authorizing Provider  naproxen sodium (ALEVE) 220 MG tablet Take 440 mg by mouth 2 (two) times daily as needed (pain/headache).   Yes [provider]  polyvinyl alcohol (LIQUIFILM TEARS) 1.4 % ophthalmic solution Place 1 drop into both eyes as needed for dry eyes.   Yes [provider]  albuterol (PROVENTIL HFA;VENTOLIN HFA) 108 (90 Base)  MCG/ACT inhaler Inhale 2 puffs into the lungs every 4 (four) hours as needed for wheezing or shortness of breath. Patient not taking: Reported on 08/02/2019 12/20/16   Marshell Garfinkel, MD  albuterol (PROVENTIL) (2.5 MG/3ML) 0.083% nebulizer solution Take 3 mLs (2.5 mg total) by nebulization every 6 (six) hours as needed for wheezing or shortness of breath. Patient not taking: Reported on 08/02/2019 07/04/17   Varney Biles, MD  albuterol (PROVENTIL) (5 MG/ML) 0.5% nebulizer solution Take 1 mL (5 mg total) by nebulization every 6 (six) hours as needed for wheezing or shortness of breath. Patient not taking: Reported on 08/02/2019 10/01/18   Charlann Lange, PA-C  azithromycin (ZITHROMAX) 250 MG tablet Take 1 tablet (250 mg total) by mouth daily. Take first 2 tablets together, then 1 every day until finished. Patient not taking: Reported on 10/01/2018 07/04/17   Varney Biles, MD  docusate sodium (COLACE) 100 MG capsule Take 1 capsule (100 mg total) by mouth 3 (three) times daily as needed. Patient not taking: Reported on 10/01/2018 04/09/18   Grier Mitts, PA-C  fluticasone [facility]) 50 MCG/ACT nasal spray Place 1 spray into both nostrils daily. Patient not taking: Reported on 08/02/2019 06/11/16   Hollice Gong, Mir Mohammed, MD  Fluticasone-Salmeterol (ADVAIR DISKUS) 250-50 MCG/DOSE AEPB Inhale 1 puff into the lungs 2 (two) times daily. Patient not taking: Reported on 10/01/2018 12/20/16   Marshell Garfinkel, MD  gabapentin (NEURONTIN) 300 MG capsule Take 1 capsule (300 mg total) by mouth 3 (three) times daily. Patient not taking: Reported on 08/02/2019 07/02/18   Virgel Manifold, MD  glucose monitoring kit (FREESTYLE) monitoring kit 1 each by Does not apply route as needed for other. 06/10/16   Hollice Gong, Mir Earlie Server, MD  guaiFENesin (ROBITUSSIN) 100 MG/5ML SOLN Take 15 mLs (300 mg total) by mouth every 6 (six) hours as needed for cough or to loosen phlegm. Patient not taking: Reported on 10/01/2018 01/20/17    Murlean Iba, MD  hydrochlorothiazide (MICROZIDE) 12.5 MG capsule Take 1 capsule (12.5 mg total) by mouth daily. Patient not taking: Reported on 10/01/2018 06/10/16   Tomma Rakers, MD  ibuprofen (ADVIL,MOTRIN) 600 MG tablet Take 1 tablet (600 mg total) by mouth every 6 (six) hours as needed. Patient not taking: Reported on 10/01/2018 07/02/18   Virgel Manifold, MD  insulin aspart (NOVOLOG) 100 UNIT/ML injection Inject 5 Units into the skin 3 (three) times daily with meals. Patient not taking: Reported on 10/01/2018 06/10/16   Hollice Gong, Mir Mohammed, MD  insulin glargine (LANTUS) 100 UNIT/ML injection Inject 0.12 mLs (12 Units total) into the skin daily. Patient not taking: Reported on 10/01/2018 06/11/16   Hollice Gong, Mir Mohammed, MD  ipratropium (ATROVENT) 0.02 % nebulizer solution Take 2.5 mLs (0.5 mg total) by nebulization 4 (four) times daily. Patient not taking: Reported on 10/01/2018 01/14/17   Charlesetta Shanks, MD  ipratropium-albuterol (DUONEB) 0.5-2.5 (3) MG/3ML SOLN Take 3 mLs by nebulization every 6 (six) hours. Patient not taking: Reported on 10/01/2018 08/22/14   Hosie Poisson, MD  meclizine [facility]en)  25 MG tablet Take 1 tablet (25 mg total) by mouth 3 (three) times daily as needed for dizziness. Patient not taking: Reported on 10/01/2018 08/05/16   Leo Grosser, MD  methocarbamol (ROBAXIN) 500 MG tablet Take 1 tablet (500 mg total) by mouth 3 (three) times daily. Patient not taking: Reported on 10/01/2018 04/09/18   Grier Mitts, PA-C  mometasone-formoterol (DULERA) 100-5 MCG/ACT AERO Inhale 2 puffs into the lungs 2 (two) times daily. Patient not taking: Reported on 10/01/2018 07/04/17   Varney Biles, MD  morphine (MSIR) 15 MG tablet Take 1 tablet (15 mg total) by mouth every 4 (four) hours as needed for severe pain. Patient not taking: Reported on 10/01/2018 12/20/17   Deno Etienne, DO  oxyCODONE-acetaminophen (PERCOCET/ROXICET) 5-325 MG tablet Take 1 tablet by mouth every 4  (four) hours as needed for severe pain. Patient not taking: Reported on 08/02/2019 10/01/18   Charlann Lange, PA-C  predniSONE (DELTASONE) 20 MG tablet Take 2 tablets (40 mg total) by mouth daily. Patient not taking: Reported on 10/01/2018 07/02/18   Virgel Manifold, MD    Allergies:   Allergies  Allergen Reactions  . Aspirin Other (See Comments)    Abdominal pain    Social History:  reports that she has been smoking cigarettes. She has been smoking about 0.50 packs per day. She has never used smokeless tobacco. She reports that she does not drink alcohol or use drugs.  Family History: Family History  Problem Relation Age of Onset  . Hypertension Mother   . Diabetes Other     Physical Exam: Blood pressure (!) 167/91, pulse 82, temperature 98.8 F (37.1 C), temperature source Rectal, resp. rate (!) 21, height 5' 7"  (1.702 m), weight (!) 147.4 kg, SpO2 97 %. General: Alert, awake, oriented x3, in no acute distress. Eyes: pink conjunctiva,anicteric sclera, pupils equal and reactive to light and accomodation, HEENT: normocephalic, atraumatic, oropharynx clear Neck: supple, no masses or lymphadenopathy, no goiter, no bruits, + JVD CVS: Regular rate and rhythm, without murmurs, rubs or gallops.  2+ lower extremity edema to the level of the knee Resp : Bibasilar Rales, no wheezing GI : Morbidly obese, soft, nontender, nondistended, positive bowel sounds, no masses. No hepatomegaly. No hernia.  Musculoskeletal: No clubbing or cyanosis, positive pedal pulses. No contracture. ROM intact  Neuro: Grossly intact, no focal neurological deficits, strength 5/5 upper and lower extremities bilaterally Psych: alert and oriented x 3, normal mood and affect Skin: Right lower extremity somewhat warm, erythematous, tender, edema   LABS on Admission: I have personally reviewed all the labs and imagings below    Basic Metabolic Panel: Recent Labs  Lab 08/02/19 0610  NA 140  K 4.3  CL 100  CO2 30   GLUCOSE 133*  BUN 11  CREATININE 0.56  CALCIUM 9.0   Liver Function Tests: No results for input(s): AST, ALT, ALKPHOS, BILITOT, PROT, ALBUMIN in the last 168 hours. No results for input(s): LIPASE, AMYLASE in the last 168 hours. No results for input(s): AMMONIA in the last 168 hours. CBC: Recent Labs  Lab 08/02/19 0610  WBC 4.6  NEUTROABS 3.0  HGB 16.3*  HCT 53.7*  MCV 92.7  PLT 170   Cardiac Enzymes: No results for input(s): CKTOTAL, CKMB, CKMBINDEX, TROPONINI in the last 168 hours. BNP: Invalid input(s): POCBNP CBG: Recent Labs  Lab 08/02/19 0754  GLUCAP 129*    Radiological Exams on Admission:  Dg Chest Portable 1 View  Result Date: 08/02/2019 CLINICAL DATA:  Shortness of breath  with exertion, orthopnea, peripheral edema, hypertension. EXAM: PORTABLE CHEST 1 VIEW COMPARISON:  Chest radiograph 10/01/2018 FINDINGS: Redemonstrated cardiomegaly. Ill-defined opacity within the mid to lower lung fields (greater on the right). No definite pleural effusion. No evidence of pneumothorax. No acute bony abnormality. IMPRESSION: Cardiomegaly. Ill-defined opacity within the mid to lower lung fields likely reflecting pulmonary edema given provided history. Infection not excluded. Electronically Signed   By: Kellie Simmering DO   On: 08/02/2019 06:17      EKG: Independently reviewed.  Sinus tachycardia, prolonged QTC 480, no acute ST-T wave changes suggestive of ischemia   Assessment/Plan Principal Problem:   Acute respiratory failure with hypoxia (HCC) secondary to acute CHF, EF unknown -Patient was noted to have O2 sats 88%, sitting upright, on room air.  She was placed on 2 L O2 via nasal cannula. -Chest x-ray showed cardiomegaly with ill-defined opacity within the mid to lower lung fields likely pulmonary edema,  infection not excluded.  Peripheral edema with bibasilar Rales, BNP 119.1 -Feels slightly better after receiving IV Lasix in ED -Placed on CHF pathway, IV Lasix 40 mg  every 12 hours, Coreg 3.125 mg twice daily, losartan 25 mg daily -Strict I's and O's and daily weights, 2D echocardiogram -Given patient has been recuperating at home since April, will check D-dimer, if elevated, will rule out PE. -Given significant peripheral edema, rule out DVT  Active Problems: Cellulitis -Patient has significant pitting edema in both lower extremities up to the knees, right lower extremity however has erythema, tenderness, reported fevers at home -Placed on oral Keflex, rule out DVT  Morbid obesity -BMI 50.89 -Patient counseled on diet and weight control, heart healthy diet    COPD (chronic obstructive pulmonary disease) ([place]), with history of smoking -Currently no wheezing, continue duo nebs as needed, -Patient reports that she just quit smoking, wants to take Chantix, will start on low-dose    Insulin-requiring or dependent type II diabetes mellitus ([place]) -Obtain hemoglobin A1c -Placed on Lantus 10 units at bedtime, sliding scale insulin moderate  Accelerated hypertension with acute CHF -Patient has not been taking any of her antihypertensives at home due to loss of insurance and ran out of her meds. -Currently on IV Lasix, added Coreg, losartan, will follow closely -Placed on hydralazine IV as needed with parameters    DVT prophylaxis: Lovenox  CODE STATUS: Full CODE STATUS  Consults called: None  Family Communication: Admission, patients condition and plan of care including tests being ordered have been discussed with the patient who indicates understanding and agree with the plan and Code Status  Admission status: Inpatient telemetry  The medical decision making on this patient was of high complexity and the patient is at high risk for clinical deterioration, therefore this is a level 3 admission.  Severity of Illness:     The appropriate patient status for this patient is INPATIENT. Inpatient status is judged to be reasonable and necessary in order  to provide the required intensity of service to ensure the patient's safety. The patient's presenting symptoms, physical exam findings, and initial radiographic and laboratory data in the context of their chronic comorbidities is felt to place them at high risk for further clinical deterioration. Furthermore, it is not anticipated that the patient will be medically stable for discharge from the hospital within 2 midnights of admission. The following factors support the patient status of inpatient.   " The patient's presenting symptoms include acute respiratory failure with hypoxia congestive heart failure " The worrisome physical exam  findings include bibasilar Rales, pitting edema up to the knees, shortness of breath " The initial radiographic and laboratory data are worrisome because of elevated BNP, cardiomegaly with pulmonary edema on chest x-ray " The chronic co-morbidities include uncontrolled hypertension, morbid obesity, diabetes mellitus   * I certify that at the point of admission it is my clinical judgment that the patient will require inpatient hospital care spanning beyond 2 midnights from the point of admission due to high intensity of service, high risk for further deterioration and high frequency of surveillance required.    Time Spent on Admission: 70 minutes     Ripudeep Rai M.D. [facility] 08/02/2019, 11:07 AM

## 2019-08-03 ENCOUNTER — Inpatient Hospital Stay (HOSPITAL_COMMUNITY): Payer: Self-pay

## 2019-08-03 DIAGNOSIS — R609 Edema, unspecified: Secondary | ICD-10-CM

## 2019-08-03 LAB — BASIC METABOLIC PANEL
Anion gap: 9 (ref 5–15)
BUN: 12 mg/dL (ref 6–20)
CO2: 34 mmol/L — ABNORMAL HIGH (ref 22–32)
Calcium: 9.2 mg/dL (ref 8.9–10.3)
Chloride: 96 mmol/L — ABNORMAL LOW (ref 98–111)
Creatinine, Ser: 0.66 mg/dL (ref 0.44–1.00)
GFR calc Af Amer: >60 mL/min (ref >60)
GFR calc non Af Amer: >60 mL/min (ref >60)
Glucose, Bld: 152 mg/dL — ABNORMAL HIGH (ref 70–99)
Potassium: 4.5 mmol/L (ref 3.5–5.1)
Sodium: 139 mmol/L (ref 135–145)

## 2019-08-03 LAB — GLUCOSE, CAPILLARY
Glucose-Capillary: 172 mg/dL — ABNORMAL HIGH (ref 70–99)
Glucose-Capillary: 100 mg/dL — ABNORMAL HIGH (ref 70–99)
Glucose-Capillary: 192 mg/dL — ABNORMAL HIGH (ref 70–99)
Glucose-Capillary: 221 mg/dL — ABNORMAL HIGH (ref 70–99)

## 2019-08-03 MED ORDER — MOMETASONE FURO-FORMOTEROL FUM 100-5 MCG/ACT IN AERO
2.0000 | INHALATION_SPRAY | Freq: Two times a day (BID) | RESPIRATORY_TRACT | Status: DC
  Administered 2019-08-03 – 2019-08-04 (×4): 2 via RESPIRATORY_TRACT
  Filled 2019-08-03: qty 8.8

## 2019-08-03 MED ORDER — INSULIN GLARGINE 100 UNIT/ML ~~LOC~~ SOLN
12.0000 [IU] | Freq: Every day | SUBCUTANEOUS | Status: DC
  Administered 2019-08-03 – 2019-08-04 (×2): 12 [IU] via SUBCUTANEOUS
  Filled 2019-08-03 (×3): qty 0.12

## 2019-08-03 MED ORDER — SIMETHICONE 80 MG PO CHEW
80.0000 mg | CHEWABLE_TABLET | Freq: Four times a day (QID) | ORAL | Status: DC | PRN
  Administered 2019-08-03 – 2019-08-05 (×2): 80 mg via ORAL
  Filled 2019-08-03 (×2): qty 1

## 2019-08-03 MED ORDER — IPRATROPIUM-ALBUTEROL 0.5-2.5 (3) MG/3ML IN SOLN
3.0000 mL | Freq: Four times a day (QID) | RESPIRATORY_TRACT | Status: DC
  Administered 2019-08-03 – 2019-08-05 (×9): 3 mL via RESPIRATORY_TRACT
  Filled 2019-08-03 (×8): qty 3

## 2019-08-03 MED ORDER — CARVEDILOL 6.25 MG PO TABS
6.2500 mg | ORAL_TABLET | Freq: Two times a day (BID) | ORAL | Status: DC
  Administered 2019-08-03 – 2019-08-04 (×2): 6.25 mg via ORAL
  Filled 2019-08-03 (×3): qty 1

## 2019-08-03 MED ORDER — LABETALOL HCL 5 MG/ML IV SOLN
10.0000 mg | INTRAVENOUS | Status: DC | PRN
  Administered 2019-08-07: 10 mg via INTRAVENOUS
  Filled 2019-08-03 (×2): qty 4

## 2019-08-03 MED ORDER — HYDROCOD POLST-CPM POLST ER 10-8 MG/5ML PO SUER
5.0000 mL | Freq: Two times a day (BID) | ORAL | Status: DC | PRN
  Administered 2019-08-03 – 2019-08-05 (×2): 5 mL via ORAL
  Filled 2019-08-03 (×2): qty 5

## 2019-08-03 NOTE — Progress Notes (Signed)
Bilateral lower extremity venous duplex completed. Refer to "CV Proc" under chart review to view preliminary results.  08/03/2019 9:52 AM Kelby Aline., MHA, RVT, RDCS, RDMS

## 2019-08-03 NOTE — Progress Notes (Signed)
Pt noted coughing a lot. C/o gas pain. Pt's V/S: T-101.1*F,B/P-1187/85,P-108,R-20. On call E. Ouma paged, waiting for response .

## 2019-08-03 NOTE — Progress Notes (Signed)
[facility]                                                                              Patient Demographics  Sharon Swanson, is a 58 y.o. female, DOB - 1961/06/06, [id number]  Admit date - 08/02/2019   Admitting Physician Kalesha Irving Krystal Eaton, MD  Outpatient Primary MD for the patient is Eloise Levels, NP  Outpatient specialists:   LOS - 1  days   Medical records reviewed and are as summarized below:    Chief Complaint  Patient presents with   Shortness of Breath       Brief summary   Patient is a 58 year old female with history of hypertension, COPD, diabetes mellitus, IDDM, smoking, just quit presented to ED with 2 weeks of worsening shortness of breath.  Per patient, she had stopped taking her medication 2 weeks ago as she lost her insurance.  She started noticing shortness of breath, worse with exertion, also has not been able to sleep, has been sitting up on the couch for last 2 weeks.  She also noticed swelling in her both lower legs, worsening. Right lower leg was becoming more tight, tender and red.  Patient reported that she had a fever of 101 F last night with coughing.  She also reports chest tightness with exertion.  No nausea or vomiting, stated she had diarrhea last week. No recent long distance travels.  Patient has been home due to work injury since April 2020. COVID-19 test negative BP 201/108 at the time of admission.   Assessment & Plan    Principal Problem:   Acute respiratory failure with hypoxia (HCC) secondary to acute diastolic CHF -Patient presented with hypoxia, orthopnea, PND, placed on 2 L O2 via nasal cannula. -Continue IV Lasix 40 mg every 12 hours, Coreg, losartan -Strict I's and O's and daily weights,  negative balance of 4.0 L.  Weight 325lbs at the time of admission improved to 312lbs today. -2D echo showed EF 60 to 65%, normal LVEF, moderately increased LVH.  CT angiogram of the chest negative for pulmonary  embolism. -Strongly recommended diet and weight control, BP control -Doppler ultrasound of the lower extremity negative for DVT -Shortness of breath is improving, lower extremity edema is also improving.  Active Problems: Right lower extremity cellulitis -Improving, no fevers today, recommended to elevate leg. -Continue Keflex  Accelerated hypertension with acute CHF -Patient has not been taking any of her antihypertensives at home due to loss of insurance and ran out of her meds.  2D echo showed moderately increased LVH -Increased Coreg to 6.25 mg twice daily, continue Lasix, losartan.   -Continue IV hydralazine as needed with parameters  Morbid obesity -BMI 50.89 Patient counseled on diet and weight control, low-salt diet    COPD (chronic obstructive pulmonary disease) ([place]), with history of smoking -Currently mild wheezing -Placed on scheduled duo nebs, Dulera, continue Keflex    Insulin-requiring or dependent type II diabetes mellitus (HCC) -Hemoglobin A1c 8.4, CBGs slightly elevated -Increase Lantus to 12 units at bedtime, continue sliding scale insulin  Code Status: Full CODE STATUS DVT Prophylaxis:  Lovenox  Family Communication: Discussed all imaging  results, lab results, explained to the patient    Disposition Plan: If continues to improve, hopefully DC home in a.m.  Time Spent in minutes 25 minutes  Procedures:  2D echo CT angiogram chest  Consultants:   None  Antimicrobials:   Anti-infectives (From admission, onward)   Start     Dose/Rate Route Frequency Ordered Stop   08/02/19 1500  cephALEXin (KEFLEX) capsule 500 mg     500 mg Oral Every 6 hours 08/02/19 1306 08/07/19 1759          Medications  Scheduled Meds:  carvedilol  6.25 mg Oral BID WC   cephALEXin  500 mg Oral Q6H   enoxaparin (LOVENOX) injection  60 mg Subcutaneous Q24H   fluticasone  1 spray Each Nare Daily   furosemide  40 mg Intravenous Q12H   gabapentin  300 mg Oral  TID   insulin aspart  0-15 Units Subcutaneous TID WC   insulin aspart  0-5 Units Subcutaneous QHS   insulin glargine  12 Units Subcutaneous QHS   ipratropium-albuterol  3 mL Nebulization QID   losartan  25 mg Oral Daily   mometasone-formoterol  2 puff Inhalation BID   sodium chloride flush  3 mL Intravenous Q12H   varenicline  0.5 mg Oral Daily   Continuous Infusions:  sodium chloride     PRN Meds:.sodium chloride, acetaminophen, ondansetron (ZOFRAN) IV, polyvinyl alcohol, sodium chloride flush      Subjective:   Cosandra Khoshaba was seen and examined today.  Feels a lot better from yesterday, no chest tightness.  Leg swelling significantly improving.  Has wheezing in both lungs. Patient denies dizziness,  abdominal pain, N/V/D/C, new weakness, numbess, tingling. No acute events overnight.    Objective:   Vitals:   08/03/19 0605 08/03/19 0754 08/03/19 0857 08/03/19 1151  BP: (!) 155/86 (!) 152/80    Pulse: 98 96    Resp: 18     Temp: 98.7 F (37.1 C)     TempSrc: Oral     SpO2: 100%  (!) 89% 93%  Weight:      Height:        Intake/Output Summary (Last 24 hours) at 08/03/2019 1157 Last data filed at 08/03/2019 [id number] Gross per 24 hour  Intake 1120 ml  Output 4200 ml  Net -3080 ml     Wt Readings from Last 3 Encounters:  08/03/19 (!) 141.7 kg  09/30/18 (!) 141.1 kg  04/09/18 136.1 kg     Exam  General: Alert and oriented x 3, NAD  Eyes:   HEENT:  Atraumatic, normocephalic  Cardiovascular: S1 S2 auscultated, no murmurs, RRR  Respiratory: Bilateral expiratory wheezing  Gastrointestinal: Soft, nontender, nondistended, + bowel sounds  Ext: 1+ pedal edema bilaterally, right lower extremity redness improving  Neuro: AAOx3, Cr N's II- XII. Strength 5/5 upper and lower extremities bilaterally  Musculoskeletal: No digital cyanosis, clubbing  Skin: No rashes  Psych: Normal affect and demeanor, alert and oriented x3    Data Reviewed:  I have  personally reviewed following labs and imaging studies  Micro Results Recent Results (from the past 240 hour(s))  SARS CORONAVIRUS 2 (TAT 6-24 HRS) Nasopharyngeal Nasopharyngeal Swab     Status: None   Collection Time: 08/02/19  8:05 AM   Specimen: Nasopharyngeal Swab  Result Value Ref Range Status   SARS Coronavirus 2 NEGATIVE NEGATIVE Final    Comment: (NOTE) SARS-CoV-2 target nucleic acids are NOT DETECTED. The SARS-CoV-2 RNA is generally detectable in upper and lower  respiratory specimens during the acute phase of infection. Negative results do not preclude SARS-CoV-2 infection, do not rule out co-infections with other pathogens, and should not be used as the sole basis for treatment or other patient management decisions. Negative results must be combined with clinical observations, patient history, and epidemiological information. The expected result is Negative. Fact Sheet for Patients: SugarRoll.be Fact Sheet for Healthcare Providers: https://www.woods-[employer].com/ This test is not yet approved or cleared by the [place] FDA and  has been authorized for detection and/or diagnosis of SARS-CoV-2 by FDA under an Emergency Use Authorization (EUA). This EUA will remain  in effect (meaning this test can be used) for the duration of the COVID-19 declaration under Section 56 4(b)(1) of the Act, 21 U.S.C. section 360bbb-3(b)(1), unless the authorization is terminated or revoked sooner. Performed at [facility], [place] [address], [place], [place] [address]     Radiology Reports Ct Angio Chest Pe W Or Wo Contrast  Result Date: 08/02/2019 CLINICAL DATA:  Shortness of breath, cough, concern for PE EXAM: CT ANGIOGRAPHY CHEST WITH CONTRAST TECHNIQUE: Multidetector CT imaging of the chest was performed using the standard protocol during bolus administration of intravenous contrast. Multiplanar CT image reconstructions and MIPs were  obtained to evaluate the vascular anatomy. CONTRAST:  153mL OMNIPAQUE IOHEXOL 350 MG/ML SOLN COMPARISON:  01/19/2017 FINDINGS: Cardiovascular: Satisfactory opacification of the pulmonary arteries to the segmental level. No evidence of pulmonary embolism. Cardiomegaly. No pericardial effusion. Aortic atherosclerosis. Mediastinum/Nodes: No enlarged mediastinal, hilar, or axillary lymph nodes. Thyroid gland, trachea, and esophagus demonstrate no significant findings. Lungs/Pleura: Bandlike scarring or atelectasis of the left lung base and right upper lobe. No pleural effusion or pneumothorax. Upper Abdomen: No acute abnormality.  Hepatic steatosis. Musculoskeletal: No chest wall abnormality. No acute or significant osseous findings. Review of the MIP images confirms the above findings. IMPRESSION: 1.  Negative examination for pulmonary embolism. 2. Bandlike scarring or atelectasis of the left lung base and right upper lobe, unchanged compared to prior examination. 3.  Aortic Atherosclerosis (ICD10-I70.0). Electronically Signed   By: Eddie Candle M.D.   On: 08/02/2019 18:48   Dg Chest Portable 1 View  Result Date: 08/02/2019 CLINICAL DATA:  Shortness of breath with exertion, orthopnea, peripheral edema, hypertension. EXAM: PORTABLE CHEST 1 VIEW COMPARISON:  Chest radiograph 10/01/2018 FINDINGS: Redemonstrated cardiomegaly. Ill-defined opacity within the mid to lower lung fields (greater on the right). No definite pleural effusion. No evidence of pneumothorax. No acute bony abnormality. IMPRESSION: Cardiomegaly. Ill-defined opacity within the mid to lower lung fields likely reflecting pulmonary edema given provided history. Infection not excluded. Electronically Signed   By: Kellie Simmering DO   On: 08/02/2019 06:17   Vas [place] Lower Extremity Venous (dvt)  Result Date: 08/03/2019  Lower Venous Study Indications: Pain, and Swelling.  Limitations: Body habitus, poor ultrasound/tissue interface and unable to perform most  compression maneuvers secondary to patient in extreme pain. Comparison Study: No prior study. Performing Technologist: Maudry Mayhew MHA, RDMS, RVT, RDCS  Examination Guidelines: A complete evaluation includes B-mode imaging, spectral Doppler, color Doppler, and power Doppler as needed of all accessible portions of each vessel. Bilateral testing is considered an integral part of a complete examination. Limited examinations for reoccurring indications may be performed as noted.  +---------+---------------+---------+-----------+----------+--------------+  RIGHT     Compressibility Phasicity Spontaneity Properties Thrombus Aging  +---------+---------------+---------+-----------+----------+--------------+  CFV       Full            Yes  Yes                                    +---------+---------------+---------+-----------+----------+--------------+  SFJ       Full                                                             +---------+---------------+---------+-----------+----------+--------------+  FV Prox                             Yes                                    +---------+---------------+---------+-----------+----------+--------------+  FV Mid                              Yes                                    +---------+---------------+---------+-----------+----------+--------------+  FV Distal                                                  Not visualized  +---------+---------------+---------+-----------+----------+--------------+  PFV                                                        Not visualized  +---------+---------------+---------+-----------+----------+--------------+  POP       Full            Yes       Yes                                    +---------+---------------+---------+-----------+----------+--------------+  PTV       Full                      Yes                                    +---------+---------------+---------+-----------+----------+--------------+  PERO                                                        Not visualized  +---------+---------------+---------+-----------+----------+--------------+  +---------+---------------+---------+-----------+----------+--------------+  LEFT      Compressibility Phasicity Spontaneity Properties Thrombus Aging  +---------+---------------+---------+-----------+----------+--------------+  CFV       Full            Yes       Yes                                    +---------+---------------+---------+-----------+----------+--------------+  SFJ       Full                                                             +---------+---------------+---------+-----------+----------+--------------+  FV Prox                             Yes                                    +---------+---------------+---------+-----------+----------+--------------+  FV Mid                              Yes                                    +---------+---------------+---------+-----------+----------+--------------+  FV Distal                                                  Not visualized  +---------+---------------+---------+-----------+----------+--------------+  PFV                                                        Not visualized  +---------+---------------+---------+-----------+----------+--------------+  POP       Full            Yes       Yes                                    +---------+---------------+---------+-----------+----------+--------------+  PTV       Full                                                             +---------+---------------+---------+-----------+----------+--------------+  PERO      Full                                                             +---------+---------------+---------+-----------+----------+--------------+  Summary: Right: There is no obvious evidence of acute deep vein thrombosis by color Doppler in the lower extremity. However, portions of this examination were limited- see technologist comments above. No cystic  structure found in the popliteal fossa. Left: There is no obvious evidence of acute deep vein thrombosis by color Doppler in the lower extremity. However, portions of this examination were limited- see technologist comments above. No cystic structure found in  the popliteal fossa.  *See table(s) above for measurements and observations.    Preliminary     Lab Data:  CBC: Recent Labs  Lab 08/02/19 0610 08/02/19 1332  WBC 4.6 4.8  NEUTROABS 3.0  --   HGB 16.3* 16.6*  HCT 53.7* 54.4*  MCV 92.7 91.4  PLT 170 [id number]   Basic Metabolic Panel: Recent Labs  Lab 08/02/19 0610 08/02/19 1332 08/03/19 0440  NA 140  --  139  K 4.3  --  4.5  CL 100  --  96*  CO2 30  --  34*  GLUCOSE 133*  --  152*  BUN 11  --  12  CREATININE 0.56 0.55 0.66  CALCIUM 9.0  --  9.2   GFR: Estimated Creatinine Clearance: 113.3 mL/min (by C-G formula based on SCr of 0.66 mg/dL). Liver Function Tests: No results for input(s): AST, ALT, ALKPHOS, BILITOT, PROT, ALBUMIN in the last 168 hours. No results for input(s): LIPASE, AMYLASE in the last 168 hours. No results for input(s): AMMONIA in the last 168 hours. Coagulation Profile: No results for input(s): INR, PROTIME in the last 168 hours. Cardiac Enzymes: No results for input(s): CKTOTAL, CKMB, CKMBINDEX, TROPONINI in the last 168 hours. BNP (last 3 results) No results for input(s): PROBNP in the last 8760 hours. HbA1C: Recent Labs    08/02/19 1332  HGBA1C 8.4*   CBG: Recent Labs  Lab 08/02/19 0754 08/02/19 1639 08/02/19 1956 08/03/19 0748 08/03/19 1152  GLUCAP 129* 226* 182* 172* 100*   Lipid Profile: No results for input(s): CHOL, HDL, LDLCALC, TRIG, CHOLHDL, LDLDIRECT in the last 72 hours. Thyroid Function Tests: No results for input(s): TSH, T4TOTAL, FREET4, T3FREE, THYROIDAB in the last 72 hours. Anemia Panel: No results for input(s): VITAMINB12, FOLATE, FERRITIN, TIBC, IRON, RETICCTPCT in the last 72 hours. Urine analysis:    Component  Value Date/Time   COLORURINE STRAW (A) 08/02/2019 0610   APPEARANCEUR CLEAR 08/02/2019 0610   LABSPEC 1.005 08/02/2019 0610   PHURINE 7.0 08/02/2019 0610   GLUCOSEU NEGATIVE 08/02/2019 0610   GLUCOSEU NEGATIVE 08/12/2012 1516   HGBUR NEGATIVE 08/02/2019 0610   BILIRUBINUR NEGATIVE 08/02/2019 0610   KETONESUR NEGATIVE 08/02/2019 0610   PROTEINUR 100 (A) 08/02/2019 0610   UROBILINOGEN 1.0 08/17/2014 1522   NITRITE NEGATIVE 08/02/2019 0610   LEUKOCYTESUR NEGATIVE 08/02/2019 0610     Tamekia Rotter M.D. [facility] 08/03/2019, 11:57 AM  Pager: [phone] Between 7am to 7pm - call Pager - [phone]  After 7pm go to [email or web address] - password [email or web address]  Call night coverage person covering after 7pm

## 2019-08-04 ENCOUNTER — Inpatient Hospital Stay (HOSPITAL_COMMUNITY): Payer: Self-pay

## 2019-08-04 LAB — GLUCOSE, CAPILLARY
Glucose-Capillary: 136 mg/dL — ABNORMAL HIGH (ref 70–99)
Glucose-Capillary: 194 mg/dL — ABNORMAL HIGH (ref 70–99)
Glucose-Capillary: 171 mg/dL — ABNORMAL HIGH (ref 70–99)
Glucose-Capillary: 251 mg/dL — ABNORMAL HIGH (ref 70–99)

## 2019-08-04 LAB — PROCALCITONIN: Procalcitonin: <0.1 ng/mL

## 2019-08-04 LAB — BASIC METABOLIC PANEL
Anion gap: 11 (ref 5–15)
BUN: 19 mg/dL (ref 6–20)
CO2: 33 mmol/L — ABNORMAL HIGH (ref 22–32)
Calcium: 9.5 mg/dL (ref 8.9–10.3)
Chloride: 96 mmol/L — ABNORMAL LOW (ref 98–111)
Creatinine, Ser: 0.81 mg/dL (ref 0.44–1.00)
GFR calc Af Amer: >60 mL/min (ref >60)
GFR calc non Af Amer: >60 mL/min (ref >60)
Glucose, Bld: 162 mg/dL — ABNORMAL HIGH (ref 70–99)
Potassium: 4.5 mmol/L (ref 3.5–5.1)
Sodium: 140 mmol/L (ref 135–145)

## 2019-08-04 MED ORDER — SODIUM CHLORIDE 0.9 % IV SOLN
2.0000 g | INTRAVENOUS | Status: DC
  Administered 2019-08-04: 2 g via INTRAVENOUS
  Filled 2019-08-04: qty 20

## 2019-08-04 MED ORDER — CARVEDILOL 12.5 MG PO TABS
12.5000 mg | ORAL_TABLET | Freq: Two times a day (BID) | ORAL | Status: DC
  Administered 2019-08-04 – 2019-08-12 (×16): 12.5 mg via ORAL
  Filled 2019-08-04 (×16): qty 1

## 2019-08-04 MED ORDER — SODIUM CHLORIDE 0.9 % IV SOLN
500.0000 mg | INTRAVENOUS | Status: DC
  Administered 2019-08-04 – 2019-08-05 (×2): 500 mg via INTRAVENOUS
  Filled 2019-08-04 (×2): qty 500

## 2019-08-04 MED ORDER — LOSARTAN POTASSIUM 50 MG PO TABS
50.0000 mg | ORAL_TABLET | Freq: Every day | ORAL | Status: DC
  Administered 2019-08-05 – 2019-08-12 (×8): 50 mg via ORAL
  Filled 2019-08-04 (×8): qty 1

## 2019-08-04 MED ORDER — SODIUM CHLORIDE 0.9 % IV SOLN
2.0000 g | INTRAVENOUS | Status: DC
  Administered 2019-08-05 – 2019-08-10 (×6): 2 g via INTRAVENOUS
  Filled 2019-08-04 (×6): qty 2

## 2019-08-04 MED ORDER — BENZONATATE 100 MG PO CAPS
100.0000 mg | ORAL_CAPSULE | Freq: Three times a day (TID) | ORAL | Status: DC
  Administered 2019-08-04 – 2019-08-12 (×25): 100 mg via ORAL
  Filled 2019-08-04 (×25): qty 1

## 2019-08-04 NOTE — Plan of Care (Signed)

## 2019-08-04 NOTE — Progress Notes (Signed)
[facility]                                                                              Patient Demographics  Sharon Swanson, is a 58 y.o. female, DOB - [birth year], FO:[id number]  Admit date - 08/02/2019   Admitting Physician Sharon Donaho Krystal Eaton, MD  Outpatient Primary MD for the patient is Sharon Levels, NP  Outpatient specialists:   LOS - 2  days   Medical records reviewed and are as summarized below:    Chief Complaint  Patient presents with   Shortness of Breath       Brief summary   Patient is a 58 year old female with history of hypertension, COPD, diabetes mellitus, IDDM, smoking, just quit presented to ED with 2 weeks of worsening shortness of breath.  Per patient, she had stopped taking her medication 2 weeks ago as she lost her insurance.  She started noticing shortness of breath, worse with exertion, also has not been able to sleep, has been sitting up on the couch for last 2 weeks.  She also noticed swelling in her both lower legs, worsening. Right lower leg was becoming more tight, tender and red.  Patient reported that she had a fever of 101 F last night with coughing.  She also reports chest tightness with exertion.  No nausea or vomiting, stated she had diarrhea last week. No recent long distance travels.  Patient has been home due to work injury since April 2020. COVID-19 test negative BP 201/108 at the time of admission.   Assessment & Plan    Principal Problem:   Acute respiratory failure with hypoxia (HCC) secondary to acute diastolic CHF -Patient presented with hypoxia, orthopnea, PND, placed on 2 L O2 via nasal cannula. -Continue IV Lasix 40 mg every 12 hours, Coreg, losartan. -Patient has has responded nicely to diuresis, negative balance of 5.3 L.  Weight 325lbs at the time of admission, improved to 311 today -2D echo showed EF 60 to 65%, normal LVEF, moderately increased LVH.  CT angiogram of the chest negative for pulmonary  embolism. -Strongly recommended diet and weight control, BP control -Doppler ultrasound of the lower extremity negative for DVT -Overnight spiked fevers, coughing, congested and short of breath.  Chest x-ray showed low lung volumes and reticular opacities potentially combination of atelectasis and/or consolidation -Follow blood cultures  Active Problems: Community-acquired pneumonia -Overnight spiking fevers, temp of 101.1 F.  Follow blood cultures -Chest x-ray showed low lung volumes, reticular opacities potentially combination of atelectasis and/or consolidation -Right lower extremity cellulitis is significantly improved hence not likely the cause for spiking fevers. -will change antibiotics to IV Zithromax and Rocephin (will cover for cellulitis as well)  Right lower extremity cellulitis -Much improved from admission, change antibiotic to IV Rocephin due to #2   Accelerated hypertension with acute CHF -Patient has not been taking any of her antihypertensives at home due to loss of insurance and ran out of her meds.  2D echo showed moderately increased LVH -BP somewhat elevated, increase losartan to 50 mg daily, Coreg 12.5 mg twice daily.  Continue Lasix  Morbid obesity -BMI 50.89 Patient counseled on  diet and weight control, low-salt diet    COPD (chronic obstructive pulmonary disease) ([place]), with history of smoking -Continue scheduled duo nebs, Dulera, IV Zithromax, Rocephin    Insulin-requiring or dependent type II diabetes mellitus ([place]) -Hemoglobin A1c 8.4,  -CBGs fairly stable today, continue Lantus to 12 units nightly, sliding scale insulin  Code Status: Full CODE STATUS DVT Prophylaxis:  Lovenox  Family Communication: Discussed all imaging results, lab results, explained to the patient    Disposition Plan: Possible DC home tomorrow, will transition to oral Lasix pending clinical improvement, no fevers.  Time Spent in minutes 25 minutes  Procedures:  2D  echo CT angiogram chest  Consultants:   None  Antimicrobials:   Anti-infectives (From admission, onward)   Start     Dose/Rate Route Frequency Ordered Stop   08/04/19 1000  cefTRIAXone (ROCEPHIN) 2 g in sodium chloride 0.9 % 100 mL IVPB     2 g 200 mL/hr over 30 Minutes Intravenous Every 24 hours 08/04/19 0906     08/04/19 0915  azithromycin (ZITHROMAX) 500 mg in sodium chloride 0.9 % 250 mL IVPB     500 mg 250 mL/hr over 60 Minutes Intravenous Every 24 hours 08/04/19 0907     08/02/19 1500  cephALEXin (KEFLEX) capsule 500 mg  Status:  Discontinued     500 mg Oral Every 6 hours 08/02/19 1306 08/04/19 0906         Medications  Scheduled Meds:  benzonatate  100 mg Oral TID   carvedilol  6.25 mg Oral BID WC   enoxaparin (LOVENOX) injection  60 mg Subcutaneous Q24H   fluticasone  1 spray Each Nare Daily   furosemide  40 mg Intravenous Q12H   gabapentin  300 mg Oral TID   insulin aspart  0-15 Units Subcutaneous TID WC   insulin aspart  0-5 Units Subcutaneous QHS   insulin glargine  12 Units Subcutaneous QHS   ipratropium-albuterol  3 mL Nebulization QID   losartan  25 mg Oral Daily   mometasone-formoterol  2 puff Inhalation BID   sodium chloride flush  3 mL Intravenous Q12H   varenicline  0.5 mg Oral Daily   Continuous Infusions:  sodium chloride     azithromycin 500 mg (08/04/19 1205)   cefTRIAXone (ROCEPHIN)  IV     PRN Meds:.sodium chloride, acetaminophen, chlorpheniramine-HYDROcodone, labetalol, ondansetron (ZOFRAN) IV, polyvinyl alcohol, simethicone, sodium chloride flush      Subjective:   Sharon Swanson was seen and examined today.  Overnight spiked fever 101 F.  Feels coughing and congested today.  Leg swelling and redness improved.  Patient denies dizziness,  abdominal pain, N/V/D/C, new weakness, numbess, tingling  Objective:   Vitals:   08/04/19 0500 08/04/19 0516 08/04/19 0745 08/04/19 1209  BP:  (!) 144/90    Pulse:  (!) 110     Resp:  (!) 22    Temp:  99.1 F (37.3 C)    TempSrc:  Oral    SpO2:  90% 92% 95%  Weight: (!) 141.1 kg     Height:        Intake/Output Summary (Last 24 hours) at 08/04/2019 1428 Last data filed at 08/04/2019 0732 Gross per 24 hour  Intake 513 ml  Output 2200 ml  Net -1687 ml     Wt Readings from Last 3 Encounters:  08/04/19 (!) 141.1 kg  09/30/18 (!) 141.1 kg  04/09/18 136.1 kg   Physical Exam  General: Alert and oriented x 3, NAD  Eyes:  HEENT:  Atraumatic, normocephalic  Cardiovascular: S1 S2 clear, no murmurs, RRR. 1+ pedal edema b/l  Respiratory: Coarse breath sounds bilaterally  Gastrointestinal: Soft, nontender, nondistended, NBS  Ext: 1+ pedal edema bilaterally  Neuro: no new deficits  Musculoskeletal: No cyanosis, clubbing  Skin: Right lower leg cellulitis significantly improved  Psych: Normal affect and demeanor, alert and oriented x3   Data Reviewed:  I have personally reviewed following labs and imaging studies  Micro Results Recent Results (from the past 240 hour(s))  SARS CORONAVIRUS 2 (TAT 6-24 HRS) Nasopharyngeal Nasopharyngeal Swab     Status: None   Collection Time: 08/02/19  8:05 AM   Specimen: Nasopharyngeal Swab  Result Value Ref Range Status   SARS Coronavirus 2 NEGATIVE NEGATIVE Final    Comment: (NOTE) SARS-CoV-2 target nucleic acids are NOT DETECTED. The SARS-CoV-2 RNA is generally detectable in upper and lower respiratory specimens during the acute phase of infection. Negative results do not preclude SARS-CoV-2 infection, do not rule out co-infections with other pathogens, and should not be used as the sole basis for treatment or other patient management decisions. Negative results must be combined with clinical observations, patient history, and epidemiological information. The expected result is Negative. Fact Sheet for Patients: SugarRoll.be Fact Sheet for Healthcare  Providers: https://www.[employer].com/ This test is not yet approved or cleared by the [place] FDA and  has been authorized for detection and/or diagnosis of SARS-CoV-2 by FDA under an Emergency Use Authorization (EUA). This EUA will remain  in effect (meaning this test can be used) for the duration of the COVID-19 declaration under Section 56 4(b)(1) of the Act, 21 U.S.C. section 360bbb-3(b)(1), unless the authorization is terminated or revoked sooner. Performed at [facility], [place] [address], [place], [place] [address]     Radiology Reports Dg Chest 2 View  Result Date: 08/04/2019 CLINICAL DATA:  58 year old female with hypertension and shortness of breath EXAM: CHEST - 2 VIEW COMPARISON:  08/02/2019 CT 08/02/2019 FINDINGS: Cardiomediastinal silhouette unchanged in size and contour. Low lung volumes persist with reticular opacities throughout the lungs, similar to the prior plain film. No pneumothorax or pleural effusion. No new confluent airspace disease. IMPRESSION: Similar appearance of low lung volumes and reticular opacities, potentially combination of atelectasis and/or consolidation. Electronically Signed   By: Corrie Mckusick D.O.   On: 08/04/2019 09:44   Ct Angio Chest Pe W Or Wo Contrast  Result Date: 08/02/2019 CLINICAL DATA:  Shortness of breath, cough, concern for PE EXAM: CT ANGIOGRAPHY CHEST WITH CONTRAST TECHNIQUE: Multidetector CT imaging of the chest was performed using the standard protocol during bolus administration of intravenous contrast. Multiplanar CT image reconstructions and MIPs were obtained to evaluate the vascular anatomy. CONTRAST:  160mL OMNIPAQUE IOHEXOL 350 MG/ML SOLN COMPARISON:  01/19/2017 FINDINGS: Cardiovascular: Satisfactory opacification of the pulmonary arteries to the segmental level. No evidence of pulmonary embolism. Cardiomegaly. No pericardial effusion. Aortic atherosclerosis. Mediastinum/Nodes: No enlarged mediastinal,  hilar, or axillary lymph nodes. Thyroid gland, trachea, and esophagus demonstrate no significant findings. Lungs/Pleura: Bandlike scarring or atelectasis of the left lung base and right upper lobe. No pleural effusion or pneumothorax. Upper Abdomen: No acute abnormality.  Hepatic steatosis. Musculoskeletal: No chest wall abnormality. No acute or significant osseous findings. Review of the MIP images confirms the above findings. IMPRESSION: 1.  Negative examination for pulmonary embolism. 2. Bandlike scarring or atelectasis of the left lung base and right upper lobe, unchanged compared to prior examination. 3.  Aortic Atherosclerosis (ICD10-I70.0). Electronically Signed  By: Eddie Candle M.D.   On: 08/02/2019 18:48   Dg Chest Portable 1 View  Result Date: 08/02/2019 CLINICAL DATA:  Shortness of breath with exertion, orthopnea, peripheral edema, hypertension. EXAM: PORTABLE CHEST 1 VIEW COMPARISON:  Chest radiograph 10/01/2018 FINDINGS: Redemonstrated cardiomegaly. Ill-defined opacity within the mid to lower lung fields (greater on the right). No definite pleural effusion. No evidence of pneumothorax. No acute bony abnormality. IMPRESSION: Cardiomegaly. Ill-defined opacity within the mid to lower lung fields likely reflecting pulmonary edema given provided history. Infection not excluded. Electronically Signed   By: Kellie Simmering DO   On: 08/02/2019 06:17   Vas [place] Lower Extremity Venous (dvt)  Result Date: 08/03/2019  Lower Venous Study Indications: Pain, and Swelling.  Limitations: Body habitus, poor ultrasound/tissue interface and unable to perform most compression maneuvers secondary to patient in extreme pain. Comparison Study: No prior study. Performing Technologist: Maudry Mayhew MHA, RDMS, RVT, RDCS  Examination Guidelines: A complete evaluation includes B-mode imaging, spectral Doppler, color Doppler, and power Doppler as needed of all accessible portions of each vessel. Bilateral testing is  considered an integral part of a complete examination. Limited examinations for reoccurring indications may be performed as noted.  +---------+---------------+---------+-----------+----------+--------------+  RIGHT     Compressibility Phasicity Spontaneity Properties Thrombus Aging  +---------+---------------+---------+-----------+----------+--------------+  CFV       Full            Yes       Yes                                    +---------+---------------+---------+-----------+----------+--------------+  SFJ       Full                                                             +---------+---------------+---------+-----------+----------+--------------+  FV Prox                             Yes                                    +---------+---------------+---------+-----------+----------+--------------+  FV Mid                              Yes                                    +---------+---------------+---------+-----------+----------+--------------+  FV Distal                                                  Not visualized  +---------+---------------+---------+-----------+----------+--------------+  PFV  Not visualized  +---------+---------------+---------+-----------+----------+--------------+  POP       Full            Yes       Yes                                    +---------+---------------+---------+-----------+----------+--------------+  PTV       Full                      Yes                                    +---------+---------------+---------+-----------+----------+--------------+  PERO                                                       Not visualized  +---------+---------------+---------+-----------+----------+--------------+  +---------+---------------+---------+-----------+----------+--------------+  LEFT      Compressibility Phasicity Spontaneity Properties Thrombus Aging   +---------+---------------+---------+-----------+----------+--------------+  CFV       Full            Yes       Yes                                    +---------+---------------+---------+-----------+----------+--------------+  SFJ       Full                                                             +---------+---------------+---------+-----------+----------+--------------+  FV Prox                             Yes                                    +---------+---------------+---------+-----------+----------+--------------+  FV Mid                              Yes                                    +---------+---------------+---------+-----------+----------+--------------+  FV Distal                                                  Not visualized  +---------+---------------+---------+-----------+----------+--------------+  PFV                                                        Not visualized  +---------+---------------+---------+-----------+----------+--------------+  POP       Full            Yes       Yes                                    +---------+---------------+---------+-----------+----------+--------------+  PTV       Full                                                             +---------+---------------+---------+-----------+----------+--------------+  PERO      Full                                                             +---------+---------------+---------+-----------+----------+--------------+  Summary: Right: There is no obvious evidence of acute deep vein thrombosis by color Doppler in the lower extremity. However, portions of this examination were limited- see technologist comments above. No cystic structure found in the popliteal fossa. Left: There is no obvious evidence of acute deep vein thrombosis by color Doppler in the lower extremity. However, portions of this examination were limited- see technologist comments above. No cystic structure found in the popliteal fossa.  *See  table(s) above for measurements and observations.    Preliminary     Lab Data:  CBC: Recent Labs  Lab 08/02/19 0610 08/02/19 1332  WBC 4.6 4.8  NEUTROABS 3.0  --   HGB 16.3* 16.6*  HCT 53.7* 54.4*  MCV 92.7 91.4  PLT 170 [id number]   Basic Metabolic Panel: Recent Labs  Lab 08/02/19 0610 08/02/19 1332 08/03/19 0440 08/04/19 0350  NA 140  --  139 140  K 4.3  --  4.5 4.5  CL 100  --  96* 96*  CO2 30  --  34* 33*  GLUCOSE 133*  --  152* 162*  BUN 11  --  12 19  CREATININE 0.56 0.55 0.66 0.81  CALCIUM 9.0  --  9.2 9.5   GFR: Estimated Creatinine Clearance: 111.6 mL/min (by C-G formula based on SCr of 0.81 mg/dL). Liver Function Tests: No results for input(s): AST, ALT, ALKPHOS, BILITOT, PROT, ALBUMIN in the last 168 hours. No results for input(s): LIPASE, AMYLASE in the last 168 hours. No results for input(s): AMMONIA in the last 168 hours. Coagulation Profile: No results for input(s): INR, PROTIME in the last 168 hours. Cardiac Enzymes: No results for input(s): CKTOTAL, CKMB, CKMBINDEX, TROPONINI in the last 168 hours. BNP (last 3 results) No results for input(s): PROBNP in the last 8760 hours. HbA1C: Recent Labs    08/02/19 1332  HGBA1C 8.4*   CBG: Recent Labs  Lab 08/03/19 1152 08/03/19 1624 08/03/19 2010 08/04/19 0753 08/04/19 1150  GLUCAP 100* 192* 221* 136* 194*   Lipid Profile: No results for input(s): CHOL, HDL, LDLCALC, TRIG, CHOLHDL, LDLDIRECT in the last 72 hours. Thyroid Function Tests: No results for input(s): TSH, T4TOTAL, FREET4, T3FREE, THYROIDAB in the last 72 hours. Anemia Panel: No results for input(s): VITAMINB12, FOLATE, FERRITIN, TIBC, IRON, RETICCTPCT in the last 72 hours. Urine analysis:  Component Value Date/Time   COLORURINE STRAW (A) 08/02/2019 0610   APPEARANCEUR CLEAR 08/02/2019 0610   LABSPEC 1.005 08/02/2019 0610   PHURINE 7.0 08/02/2019 0610   GLUCOSEU NEGATIVE 08/02/2019 0610   GLUCOSEU NEGATIVE 08/12/2012 1516   HGBUR  NEGATIVE 08/02/2019 0610   BILIRUBINUR NEGATIVE 08/02/2019 0610   KETONESUR NEGATIVE 08/02/2019 0610   PROTEINUR 100 (A) 08/02/2019 0610   UROBILINOGEN 1.0 08/17/2014 1522   NITRITE NEGATIVE 08/02/2019 0610   LEUKOCYTESUR NEGATIVE 08/02/2019 0610     Nessie Nong M.D. [facility] 08/04/2019, 2:28 PM  Pager: [phone] Between 7am to 7pm - call Pager - [phone]  After 7pm go to [email or web address] - password [email or web address]  Call night coverage person covering after 7pm

## 2019-08-05 LAB — CBC
HCT: 54.2 % — ABNORMAL HIGH (ref 36.0–46.0)
Hemoglobin: 15.5 g/dL — ABNORMAL HIGH (ref 12.0–15.0)
MCH: 27.4 pg (ref 26.0–34.0)
MCHC: 28.6 g/dL — ABNORMAL LOW (ref 30.0–36.0)
MCV: 95.8 fL (ref 80.0–100.0)
Platelets: 177 10*3/uL (ref 150–400)
RBC: 5.66 MIL/uL — ABNORMAL HIGH (ref 3.87–5.11)
RDW: 15.2 % (ref 11.5–15.5)
WBC: 4.8 10*3/uL (ref 4.0–10.5)
nRBC: 0 % (ref 0.0–0.2)

## 2019-08-05 LAB — GLUCOSE, CAPILLARY
Glucose-Capillary: 143 mg/dL — ABNORMAL HIGH (ref 70–99)
Glucose-Capillary: 230 mg/dL — ABNORMAL HIGH (ref 70–99)
Glucose-Capillary: 284 mg/dL — ABNORMAL HIGH (ref 70–99)
Glucose-Capillary: 286 mg/dL — ABNORMAL HIGH (ref 70–99)

## 2019-08-05 LAB — BASIC METABOLIC PANEL
Anion gap: 10 (ref 5–15)
BUN: 17 mg/dL (ref 6–20)
CO2: 34 mmol/L — ABNORMAL HIGH (ref 22–32)
Calcium: 8.9 mg/dL (ref 8.9–10.3)
Chloride: 94 mmol/L — ABNORMAL LOW (ref 98–111)
Creatinine, Ser: 0.63 mg/dL (ref 0.44–1.00)
GFR calc Af Amer: >60 mL/min (ref >60)
GFR calc non Af Amer: >60 mL/min (ref >60)
Glucose, Bld: 152 mg/dL — ABNORMAL HIGH (ref 70–99)
Potassium: 4.1 mmol/L (ref 3.5–5.1)
Sodium: 138 mmol/L (ref 135–145)

## 2019-08-05 MED ORDER — AZITHROMYCIN 250 MG PO TABS
500.0000 mg | ORAL_TABLET | Freq: Every day | ORAL | Status: DC
  Administered 2019-08-06 – 2019-08-10 (×5): 500 mg via ORAL
  Filled 2019-08-05 (×5): qty 2

## 2019-08-05 MED ORDER — IPRATROPIUM-ALBUTEROL 0.5-2.5 (3) MG/3ML IN SOLN
3.0000 mL | Freq: Three times a day (TID) | RESPIRATORY_TRACT | Status: DC
  Administered 2019-08-05 – 2019-08-06 (×3): 3 mL via RESPIRATORY_TRACT
  Filled 2019-08-05 (×3): qty 3

## 2019-08-05 MED ORDER — INSULIN GLARGINE 100 UNIT/ML ~~LOC~~ SOLN
15.0000 [IU] | Freq: Every day | SUBCUTANEOUS | Status: DC
  Administered 2019-08-05: 15 [IU] via SUBCUTANEOUS
  Filled 2019-08-05: qty 0.15

## 2019-08-05 MED ORDER — BUDESONIDE 0.25 MG/2ML IN SUSP
0.2500 mg | Freq: Two times a day (BID) | RESPIRATORY_TRACT | Status: DC
  Administered 2019-08-05 – 2019-08-07 (×5): 0.25 mg via RESPIRATORY_TRACT
  Filled 2019-08-05 (×5): qty 2

## 2019-08-05 MED ORDER — ARFORMOTEROL TARTRATE 15 MCG/2ML IN NEBU
15.0000 ug | INHALATION_SOLUTION | Freq: Two times a day (BID) | RESPIRATORY_TRACT | Status: DC
  Administered 2019-08-05 – 2019-08-07 (×5): 15 ug via RESPIRATORY_TRACT
  Filled 2019-08-05 (×4): qty 2

## 2019-08-05 MED ORDER — INSULIN ASPART 100 UNIT/ML ~~LOC~~ SOLN
3.0000 [IU] | Freq: Three times a day (TID) | SUBCUTANEOUS | Status: DC
  Administered 2019-08-05 (×2): 3 [IU] via SUBCUTANEOUS

## 2019-08-05 MED ORDER — METHYLPREDNISOLONE SODIUM SUCC 40 MG IJ SOLR
40.0000 mg | Freq: Two times a day (BID) | INTRAMUSCULAR | Status: DC
  Administered 2019-08-05 (×2): 40 mg via INTRAVENOUS
  Filled 2019-08-05 (×2): qty 1

## 2019-08-05 NOTE — Progress Notes (Signed)
Pt oxygen sat dropped to 87% on 1.5 L via [place]. Increased oxygen to 2.5L to get sat to 90%.  Stacey Drain

## 2019-08-05 NOTE — Plan of Care (Signed)
  Problem: Activity: Goal: Capacity to carry out activities will improve Outcome: Progressing   Problem: Education: Goal: Knowledge of General Education information will improve Description: Including pain rating scale, medication(s)/side effects and non-pharmacologic comfort measures Outcome: Completed/Met   Problem: Nutrition: Goal: Adequate nutrition will be maintained Outcome: Completed/Met   Problem: Pain Managment: Goal: General experience of comfort will improve Outcome: Completed/Met   Problem: Skin Integrity: Goal: Risk for impaired skin integrity will decrease Outcome: Completed/Met

## 2019-08-05 NOTE — Progress Notes (Signed)
[facility]                                                                              Patient Demographics  Sharon Swanson, is a 58 y.o. female, DOB - [birth year], [id number]  Admit date - 08/02/2019   Admitting Physician Darrol Brandenburg Krystal Eaton, MD  Outpatient Primary MD for the patient is Eloise Levels, NP  Outpatient specialists:   LOS - 3  days   Medical records reviewed and are as summarized below:    Chief Complaint  Patient presents with   Shortness of Breath       Brief summary   Patient is a 58 year old female with history of hypertension, COPD, diabetes mellitus, IDDM, smoking, just quit presented to ED with 2 weeks of worsening shortness of breath.  Per patient, she had stopped taking her medication 2 weeks ago as she lost her insurance.  She started noticing shortness of breath, worse with exertion, also has not been able to sleep, has been sitting up on the couch for last 2 weeks.  She also noticed swelling in her both lower legs, worsening. Right lower leg was becoming more tight, tender and red.  Patient reported that she had a fever of 101 F last night with coughing.  She also reports chest tightness with exertion.  No nausea or vomiting, stated she had diarrhea last week. No recent long distance travels.  Patient has been home due to work injury since April 2020. COVID-19 test negative BP 201/108 at the time of admission.   Assessment & Plan    Principal Problem:   Acute respiratory failure with hypoxia (HCC) secondary to acute diastolic CHF -Patient presented with hypoxia, orthopnea, PND, placed on 2 L O2 via nasal cannula.  Started on IV Lasix -2D echo showed EF 60 to 65%, normal LVEF, moderately increased LVH.  CT angiogram of the chest negative for pulmonary embolism. -Strongly recommended diet and weight control, BP control -Doppler ultrasound of the lower extremity negative for DVT -Continue IV Lasix 40 mg every 12 hours, Coreg,  losartan, if continues to improve, and wean off O2, will transition to oral Lasix in a.m. -Continue Coreg, losartan -Improving with diuresis, negative balance of 5.5 L, weight 325lbs on admission, improved to 307 lbs today  -O2 sats 92% on 4 L at the time of my examination, wean O2 as tolerated.  Patient was not on O2 PTA. -Will need home O2 evaluation prior to discharge.  Active Problems: Community-acquired pneumonia -Afebrile this morning, blood cultures negative so far.  -Chest x-ray 11/9 showed low lung volumes, reticular opacities potentially combination of atelectasis and/or consolidation -Right lower extremity cellulitis is significantly improved hence not likely the cause for spiking fevers. -Continue Zithromax, Rocephin (will cover for cellulitis as well)  Right lower extremity cellulitis -Much improved from admission, change antibiotic to IV Rocephin due to #2  Accelerated hypertension with acute CHF -Patient has not been taking any of her antihypertensives at home due to loss of insurance and ran out of her meds.  2D echo showed moderately increased LVH -Increase losartan to 50 mg daily, Coreg 12.5 mg twice daily, continue  Lasix   Morbid obesity -BMI 50.89 Patient counseled on diet and weight control, low-salt diet    COPD (chronic obstructive pulmonary disease) ([place]), with history of smoking -Continue scheduled duo nebs, Dulera, Zithromax, Rocephin    Insulin-requiring or dependent type II diabetes mellitus ([place]) -Hemoglobin A1c 8.4,  -CBGs still elevated, increase Lantus to 15 units daily, add NovoLog meal coverage 3 units 3 times daily AC, sliding scale insulin   Code Status: Full CODE STATUS DVT Prophylaxis:  Lovenox  Family Communication: Discussed all imaging results, lab results, explained to the patient    Disposition Plan: Still on 4 L O2, wean O2 as tolerated.   will continue IV Lasix today, likely will need home O2 evaluation prior to discharge, likely  DC in a.m. if improving and off O2  Time Spent in minutes 25 minutes  Procedures:  2D echo CT angiogram chest  Consultants:   None  Antimicrobials:   Anti-infectives (From admission, onward)   Start     Dose/Rate Route Frequency Ordered Stop   08/06/19 1000  azithromycin (ZITHROMAX) tablet 500 mg     500 mg Oral Daily 08/05/19 1319     08/05/19 1600  cefTRIAXone (ROCEPHIN) 2 g in sodium chloride 0.9 % 100 mL IVPB     2 g 200 mL/hr over 30 Minutes Intravenous Every 24 hours 08/04/19 1525     08/04/19 1000  cefTRIAXone (ROCEPHIN) 2 g in sodium chloride 0.9 % 100 mL IVPB  Status:  Discontinued     2 g 200 mL/hr over 30 Minutes Intravenous Every 24 hours 08/04/19 0906 08/04/19 1525   08/04/19 0915  azithromycin (ZITHROMAX) 500 mg in sodium chloride 0.9 % 250 mL IVPB  Status:  Discontinued     500 mg 250 mL/hr over 60 Minutes Intravenous Every 24 hours 08/04/19 0907 08/05/19 1319   08/02/19 1500  cephALEXin (KEFLEX) capsule 500 mg  Status:  Discontinued     500 mg Oral Every 6 hours 08/02/19 1306 08/04/19 0906         Medications  Scheduled Meds:  arformoterol  15 mcg Nebulization BID   [START ON 08/06/2019] azithromycin  500 mg Oral Daily   benzonatate  100 mg Oral TID   budesonide (PULMICORT) nebulizer solution  0.25 mg Nebulization BID   carvedilol  12.5 mg Oral BID WC   enoxaparin (LOVENOX) injection  60 mg Subcutaneous Q24H   fluticasone  1 spray Each Nare Daily   furosemide  40 mg Intravenous Q12H   gabapentin  300 mg Oral TID   insulin aspart  0-15 Units Subcutaneous TID WC   insulin aspart  0-5 Units Subcutaneous QHS   insulin aspart  3 Units Subcutaneous TID WC   insulin glargine  15 Units Subcutaneous QHS   ipratropium-albuterol  3 mL Nebulization TID   losartan  50 mg Oral Daily   methylPREDNISolone (SOLU-MEDROL) injection  40 mg Intravenous Q12H   sodium chloride flush  3 mL Intravenous Q12H   varenicline  0.5 mg Oral Daily   Continuous  Infusions:  sodium chloride     cefTRIAXone (ROCEPHIN)  IV     PRN Meds:.sodium chloride, acetaminophen, chlorpheniramine-HYDROcodone, labetalol, ondansetron (ZOFRAN) IV, polyvinyl alcohol, simethicone, sodium chloride flush      Subjective:   Aynsley Benge was seen and examined today.  No fevers overnight, overall feeling better, leg swelling, cellulitis has improved.  Still on 4 L O2.  Shortness of breath is improving.  No chest pain.  Patient denies dizziness,  abdominal pain, N/V/D/C, new weakness, numbess, tingling  Objective:   Vitals:   08/05/19 0814 08/05/19 0821 08/05/19 1240 08/05/19 1354  BP:   (!) 166/85   Pulse:   91   Resp:   18   Temp:   98.7 F (37.1 C)   TempSrc:   Oral   SpO2: 90% 90% 91% 91%  Weight:      Height:        Intake/Output Summary (Last 24 hours) at 08/05/2019 1403 Last data filed at 08/05/2019 [id number] Gross per 24 hour  Intake 590 ml  Output 500 ml  Net 90 ml     Wt Readings from Last 3 Encounters:  08/05/19 (!) 139.5 kg  09/30/18 (!) 141.1 kg  04/09/18 136.1 kg   Physical Exam  General: Alert and oriented x 3, NAD  Eyes:   HEENT:  Atraumatic, normocephalic  Cardiovascular: S1 S2 clear, no murmurs, RRR. No pedal edema b/l  Respiratory: Mild expiratory wheezing, improving  Gastrointestinal: Soft, nontender, nondistended, NBS  Ext: no pedal edema bilaterally  Neuro: no new deficits  Musculoskeletal: No cyanosis, clubbing  Skin: Right lower extremity cellulitis much improved  Psych: Normal affect and demeanor, alert and oriented x3     Data Reviewed:  I have personally reviewed following labs and imaging studies  Micro Results Recent Results (from the past 240 hour(s))  SARS CORONAVIRUS 2 (TAT 6-24 HRS) Nasopharyngeal Nasopharyngeal Swab     Status: None   Collection Time: 08/02/19  8:05 AM   Specimen: Nasopharyngeal Swab  Result Value Ref Range Status   SARS Coronavirus 2 NEGATIVE NEGATIVE Final    Comment:  (NOTE) SARS-CoV-2 target nucleic acids are NOT DETECTED. The SARS-CoV-2 RNA is generally detectable in upper and lower respiratory specimens during the acute phase of infection. Negative results do not preclude SARS-CoV-2 infection, do not rule out co-infections with other pathogens, and should not be used as the sole basis for treatment or other patient management decisions. Negative results must be combined with clinical observations, patient history, and epidemiological information. The expected result is Negative. Fact Sheet for Patients: SugarRoll.be Fact Sheet for Healthcare Providers: https://www.woods-Idris.com/ This test is not yet approved or cleared by the [place] FDA and  has been authorized for detection and/or diagnosis of SARS-CoV-2 by FDA under an Emergency Use Authorization (EUA). This EUA will remain  in effect (meaning this test can be used) for the duration of the COVID-19 declaration under Section 56 4(b)(1) of the Act, 21 U.S.C. section 360bbb-3(b)(1), unless the authorization is terminated or revoked sooner. Performed at [facility], [place] [address], [place], [place] [address]   Culture, blood (routine x 2)     Status: None (Preliminary result)   Collection Time: 08/04/19  7:34 AM   Specimen: BLOOD  Result Value Ref Range Status   Specimen Description   Final    BLOOD LEFT ARM Performed at [facility] [address], [place], [place] [address]    Special Requests   Final    BOTTLES DRAWN AEROBIC AND ANAEROBIC Blood Culture results may not be optimal due to an inadequate volume of blood received in culture bottles Performed at [facility] [address] [place], [place] [address]    Culture   Final    NO GROWTH < 24 HOURS Performed at [facility] [address], [place], [place] [address]    Report Status PENDING  Incomplete  Culture, blood (routine  x 2)     Status: None (Preliminary result)   Collection Time: 08/04/19  7:34 AM   Specimen: BLOOD LEFT HAND  Result Value Ref Range Status   Specimen Description   Final    BLOOD LEFT HAND Performed at [facility] [address], [place], [place] [address]    Special Requests   Final    BOTTLES DRAWN AEROBIC ONLY Blood Culture results may not be optimal due to an inadequate volume of blood received in culture bottles Performed at [facility] [address], [place], [place] [address]    Culture   Final    NO GROWTH < 24 HOURS Performed at [facility] [address], [place], [place] [address]    Report Status PENDING  Incomplete    Radiology Reports Dg Chest 2 View  Result Date: 08/04/2019 CLINICAL DATA:  58 year old female with hypertension and shortness of breath EXAM: CHEST - 2 VIEW COMPARISON:  08/02/2019 CT 08/02/2019 FINDINGS: Cardiomediastinal silhouette unchanged in size and contour. Low lung volumes persist with reticular opacities throughout the lungs, similar to the prior plain film. No pneumothorax or pleural effusion. No new confluent airspace disease. IMPRESSION: Similar appearance of low lung volumes and reticular opacities, potentially combination of atelectasis and/or consolidation. Electronically Signed   By: Corrie Mckusick D.O.   On: 08/04/2019 09:44   Ct Angio Chest Pe W Or Wo Contrast  Result Date: 08/02/2019 CLINICAL DATA:  Shortness of breath, cough, concern for PE EXAM: CT ANGIOGRAPHY CHEST WITH CONTRAST TECHNIQUE: Multidetector CT imaging of the chest was performed using the standard protocol during bolus administration of intravenous contrast. Multiplanar CT image reconstructions and MIPs were obtained to evaluate the vascular anatomy. CONTRAST:  11mL OMNIPAQUE IOHEXOL 350 MG/ML SOLN COMPARISON:  01/19/2017 FINDINGS: Cardiovascular: Satisfactory opacification of the pulmonary arteries to the segmental level. No  evidence of pulmonary embolism. Cardiomegaly. No pericardial effusion. Aortic atherosclerosis. Mediastinum/Nodes: No enlarged mediastinal, hilar, or axillary lymph nodes. Thyroid gland, trachea, and esophagus demonstrate no significant findings. Lungs/Pleura: Bandlike scarring or atelectasis of the left lung base and right upper lobe. No pleural effusion or pneumothorax. Upper Abdomen: No acute abnormality.  Hepatic steatosis. Musculoskeletal: No chest wall abnormality. No acute or significant osseous findings. Review of the MIP images confirms the above findings. IMPRESSION: 1.  Negative examination for pulmonary embolism. 2. Bandlike scarring or atelectasis of the left lung base and right upper lobe, unchanged compared to prior examination. 3.  Aortic Atherosclerosis (ICD10-I70.0). Electronically Signed   By: Eddie Candle M.D.   On: 08/02/2019 18:48   Dg Chest Portable 1 View  Result Date: 08/02/2019 CLINICAL DATA:  Shortness of breath with exertion, orthopnea, peripheral edema, hypertension. EXAM: PORTABLE CHEST 1 VIEW COMPARISON:  Chest radiograph 10/01/2018 FINDINGS: Redemonstrated cardiomegaly. Ill-defined opacity within the mid to lower lung fields (greater on the right). No definite pleural effusion. No evidence of pneumothorax. No acute bony abnormality. IMPRESSION: Cardiomegaly. Ill-defined opacity within the mid to lower lung fields likely reflecting pulmonary edema given provided history. Infection not excluded. Electronically Signed   By: Kellie Simmering DO   On: 08/02/2019 06:17   Vas [place] Lower Extremity Venous (dvt)  Result Date: 08/03/2019  Lower Venous Study Indications: Pain, and Swelling.  Limitations: Body habitus, poor ultrasound/tissue interface and unable to perform most compression maneuvers secondary to patient in extreme pain. Comparison Study: No prior study. Performing Technologist: Maudry Mayhew MHA, RDMS, RVT, RDCS  Examination Guidelines: A complete evaluation includes  B-mode  imaging, spectral Doppler, color Doppler, and power Doppler as needed of all accessible portions of each vessel. Bilateral testing is considered an integral part of a complete examination. Limited examinations for reoccurring indications may be performed as noted.  +---------+---------------+---------+-----------+----------+--------------+  RIGHT     Compressibility Phasicity Spontaneity Properties Thrombus Aging  +---------+---------------+---------+-----------+----------+--------------+  CFV       Full            Yes       Yes                                    +---------+---------------+---------+-----------+----------+--------------+  SFJ       Full                                                             +---------+---------------+---------+-----------+----------+--------------+  FV Prox                             Yes                                    +---------+---------------+---------+-----------+----------+--------------+  FV Mid                              Yes                                    +---------+---------------+---------+-----------+----------+--------------+  FV Distal                                                  Not visualized  +---------+---------------+---------+-----------+----------+--------------+  PFV                                                        Not visualized  +---------+---------------+---------+-----------+----------+--------------+  POP       Full            Yes       Yes                                    +---------+---------------+---------+-----------+----------+--------------+  PTV       Full                      Yes                                    +---------+---------------+---------+-----------+----------+--------------+  PERO  Not visualized  +---------+---------------+---------+-----------+----------+--------------+  +---------+---------------+---------+-----------+----------+--------------+  LEFT       Compressibility Phasicity Spontaneity Properties Thrombus Aging  +---------+---------------+---------+-----------+----------+--------------+  CFV       Full            Yes       Yes                                    +---------+---------------+---------+-----------+----------+--------------+  SFJ       Full                                                             +---------+---------------+---------+-----------+----------+--------------+  FV Prox                             Yes                                    +---------+---------------+---------+-----------+----------+--------------+  FV Mid                              Yes                                    +---------+---------------+---------+-----------+----------+--------------+  FV Distal                                                  Not visualized  +---------+---------------+---------+-----------+----------+--------------+  PFV                                                        Not visualized  +---------+---------------+---------+-----------+----------+--------------+  POP       Full            Yes       Yes                                    +---------+---------------+---------+-----------+----------+--------------+  PTV       Full                                                             +---------+---------------+---------+-----------+----------+--------------+  PERO      Full                                                             +---------+---------------+---------+-----------+----------+--------------+  Summary: Right: There is no obvious evidence of acute deep vein thrombosis by color Doppler in the lower extremity. However, portions of this examination were limited- see technologist comments above. No cystic structure found in the popliteal fossa. Left: There is no obvious evidence of acute deep vein thrombosis by color Doppler in the lower extremity. However, portions of this examination were limited- see technologist comments  above. No cystic structure found in the popliteal fossa.  *See table(s) above for measurements and observations.    Preliminary     Lab Data:  CBC: Recent Labs  Lab 08/02/19 0610 08/02/19 1332 08/05/19 0355  WBC 4.6 4.8 4.8  NEUTROABS 3.0  --   --   HGB 16.3* 16.6* 15.5*  HCT 53.7* 54.4* 54.2*  MCV 92.7 91.4 95.8  PLT 170 172 [id number]   Basic Metabolic Panel: Recent Labs  Lab 08/02/19 0610 08/02/19 1332 08/03/19 0440 08/04/19 0350 08/05/19 0355  NA 140  --  139 140 138  K 4.3  --  4.5 4.5 4.1  CL 100  --  96* 96* 94*  CO2 30  --  34* 33* 34*  GLUCOSE 133*  --  152* 162* 152*  BUN 11  --  12 19 17   CREATININE 0.56 0.55 0.66 0.81 0.63  CALCIUM 9.0  --  9.2 9.5 8.9   GFR: Estimated Creatinine Clearance: 112.3 mL/min (by C-G formula based on SCr of 0.63 mg/dL). Liver Function Tests: No results for input(s): AST, ALT, ALKPHOS, BILITOT, PROT, ALBUMIN in the last 168 hours. No results for input(s): LIPASE, AMYLASE in the last 168 hours. No results for input(s): AMMONIA in the last 168 hours. Coagulation Profile: No results for input(s): INR, PROTIME in the last 168 hours. Cardiac Enzymes: No results for input(s): CKTOTAL, CKMB, CKMBINDEX, TROPONINI in the last 168 hours. BNP (last 3 results) No results for input(s): PROBNP in the last 8760 hours. HbA1C: No results for input(s): HGBA1C in the last 72 hours. CBG: Recent Labs  Lab 08/04/19 1150 08/04/19 1740 08/04/19 2056 08/05/19 0734 08/05/19 1153  GLUCAP 194* 171* 251* 143* 230*   Lipid Profile: No results for input(s): CHOL, HDL, LDLCALC, TRIG, CHOLHDL, LDLDIRECT in the last 72 hours. Thyroid Function Tests: No results for input(s): TSH, T4TOTAL, FREET4, T3FREE, THYROIDAB in the last 72 hours. Anemia Panel: No results for input(s): VITAMINB12, FOLATE, FERRITIN, TIBC, IRON, RETICCTPCT in the last 72 hours. Urine analysis:    Component Value Date/Time   COLORURINE STRAW (A) 08/02/2019 0610   APPEARANCEUR CLEAR  08/02/2019 0610   LABSPEC 1.005 08/02/2019 0610   PHURINE 7.0 08/02/2019 0610   GLUCOSEU NEGATIVE 08/02/2019 0610   GLUCOSEU NEGATIVE 08/12/2012 1516   HGBUR NEGATIVE 08/02/2019 0610   BILIRUBINUR NEGATIVE 08/02/2019 0610   KETONESUR NEGATIVE 08/02/2019 0610   PROTEINUR 100 (A) 08/02/2019 0610   UROBILINOGEN 1.0 08/17/2014 1522   NITRITE NEGATIVE 08/02/2019 0610   LEUKOCYTESUR NEGATIVE 08/02/2019 0610     Betzaira Mentel M.D. [facility] 08/05/2019, 2:03 PM  Pager: [phone] Between 7am to 7pm - call Pager - [phone]  After 7pm go to [email or web address] - password [email or web address]  Call night coverage person covering after 7pm

## 2019-08-05 NOTE — Progress Notes (Signed)
Pharmacy IV to PO conversion  This patient is receiving Azithromycin by the intravenous route. Based on criteria approved by the Pharmacy and Therapeutics Committee, and the Infectious Disease Division, the antibiotic(s) is/are being converted to equivalent oral dose form(s). These criteria include:   Patient being treated for a respiratory tract infection, urinary tract infection, cellulitis, or Clostridium Difficile Associated Diarrhea  The patient is not neutropenic and does not exhibit a GI malabsorption state  The patient is eating (either orally or per tube) and/or has been taking other orally administered medications for at least 24 hours.  The patient is improving clinically (physician assessment and a 24-hour Tmax of <=100.5 F)  If you have any questions about this conversion, please contact the Pharmacy Department (ext [phone]).  Thank you.  Reuel Boom, PharmD, BCPS [phone] 08/05/2019, 1:20 PM

## 2019-08-06 ENCOUNTER — Inpatient Hospital Stay (HOSPITAL_COMMUNITY): Payer: Self-pay

## 2019-08-06 LAB — BASIC METABOLIC PANEL
Anion gap: 11 (ref 5–15)
BUN: 22 mg/dL — ABNORMAL HIGH (ref 6–20)
CO2: 32 mmol/L (ref 22–32)
Calcium: 9.2 mg/dL (ref 8.9–10.3)
Chloride: 92 mmol/L — ABNORMAL LOW (ref 98–111)
Creatinine, Ser: 0.6 mg/dL (ref 0.44–1.00)
GFR calc Af Amer: >60 mL/min (ref >60)
GFR calc non Af Amer: >60 mL/min (ref >60)
Glucose, Bld: 270 mg/dL — ABNORMAL HIGH (ref 70–99)
Potassium: 4.5 mmol/L (ref 3.5–5.1)
Sodium: 135 mmol/L (ref 135–145)

## 2019-08-06 LAB — GLUCOSE, CAPILLARY
Glucose-Capillary: 414 mg/dL — ABNORMAL HIGH (ref 70–99)
Glucose-Capillary: 223 mg/dL — ABNORMAL HIGH (ref 70–99)
Glucose-Capillary: 260 mg/dL — ABNORMAL HIGH (ref 70–99)
Glucose-Capillary: 302 mg/dL — ABNORMAL HIGH (ref 70–99)

## 2019-08-06 MED ORDER — PREDNISONE 50 MG PO TABS
50.0000 mg | ORAL_TABLET | Freq: Every day | ORAL | Status: DC
  Administered 2019-08-06 – 2019-08-07 (×2): 50 mg via ORAL
  Filled 2019-08-06 (×2): qty 1

## 2019-08-06 MED ORDER — INSULIN ASPART 100 UNIT/ML ~~LOC~~ SOLN
6.0000 [IU] | Freq: Three times a day (TID) | SUBCUTANEOUS | Status: DC
  Administered 2019-08-06 – 2019-08-07 (×4): 6 [IU] via SUBCUTANEOUS

## 2019-08-06 MED ORDER — IPRATROPIUM-ALBUTEROL 0.5-2.5 (3) MG/3ML IN SOLN
3.0000 mL | Freq: Four times a day (QID) | RESPIRATORY_TRACT | Status: DC
  Administered 2019-08-06 (×2): 3 mL via RESPIRATORY_TRACT
  Filled 2019-08-06 (×2): qty 3

## 2019-08-06 MED ORDER — INSULIN GLARGINE 100 UNIT/ML ~~LOC~~ SOLN
20.0000 [IU] | Freq: Every day | SUBCUTANEOUS | Status: DC
  Administered 2019-08-06: 20 [IU] via SUBCUTANEOUS
  Filled 2019-08-06: qty 0.2

## 2019-08-06 NOTE — Progress Notes (Signed)
Patient continues to decline nocturnal CPAP.  

## 2019-08-06 NOTE — Progress Notes (Signed)
PROGRESS NOTE    Sharon Swanson    Code Status: Full Code  [id number] DOB: 05-15-1961 DOA: 08/02/2019  PCP: Eloise Levels, NP    Hospital Summary  Patient is a 58 year old female with history of hypertension, COPD, diabetes mellitus, IDDM, smoking, just quit presented to ED with 2 weeks of worsening shortness of breath. Per patient, she had stopped taking her medication 2 weeks ago as she lost her insurance. She started noticing shortness of breath, worse with exertion, also has not been able to sleep, has been sitting up on the couch for last 2 weeks. She also noticed swelling in her both lower legs, worsening. Right lower leg was becoming more tight, tender and red. Patient reported that she had a fever of 101 F last night with coughing. She also reports chest tightness with exertion. No nausea or vomiting, stated she had diarrhea last week. No recent long distance travels. Patient has been home due to work injury since April 2020. COVID-19 test negative BP 201/108 at the time of admission.  Since being hospitalized patient has been treated with IV Lasix for diastolic heart failure exacerbation as well as antibiotics for CAP and right lower extremity cellulitis.  Symptoms have improved but not resolved  A & P   Principal Problem:   Acute respiratory failure with hypoxia (HCC) Active Problems:   BMI 45.0-49.9, adult (HCC)   COPD (chronic obstructive pulmonary disease) (HCC)   Insulin-requiring or dependent type II diabetes mellitus (HCC)   Essential hypertension   Acute CHF (congestive heart failure) (HCC)   Cellulitis   Acute hypoxic respiratory failure secondary to Acute Diastolic Heart Failure Exacerbation and community-acquired pneumonia and COPD exacerbation  Afebrile, O2 titrated down from 3 L to 1.5 L this a.m. SPO2 remained at 95%.  Still volume overloaded on exam.  Chest x-ray today: Retrocardiac density which could be atelectasis/consolidation or artifact given  body habitus.  CTA chest negative for PE 11/7 -Continue IV Lasix 40 mg twice daily -Continue Rocephin/azithromycin -Sputum culture -Change IV Solu-Medrol to p.o. prednisone -Continue to titrate oxygen as needed -Increase frequency of duo nebs -will need ambulatory pulse ox assessment prior to discharge  Community-acquired pneumonia afebrile, CBC not drawn today, procalcitonin negative.  Day 3/5 azithromycin/doxycycline. Clinically still appears toxic.   - Continue CAP therapy -If no improvement will consider viral panel  COPD exacerbation secondary to CAP and heart failure exacerbation -Change IV steroids to p.o. steroids for total of 5 days duration -Increase frequency of DuoNeb -Continue aformoterol and Pulmicort and Tessalon Perles -Sputum culture  Cellulitis improved -Continue IV Rocephin  Accelerated hypertension with acute heart failure was not taking any of her antihypertensives at home due to lack of insurance.  Echo with LVH -Continue increased losartan 50 mg daily -Continue Coreg 12.5 mg twice daily -Continue IV Lasix consider switching to p.o. in a.m. -Social work consult  Morbid obesity -Counseled on diet and weight control  [facility] cannot afford her home CPAP machine -CPAP nightly while inpatient -Social work consulted  Steroid induced hyperglycemia in setting of poorly controlled diabetes HA1C 8.4.  -Increase basal bolus dosing: Lantus 20 units nightly, NovoLog 6 units 3 times daily with meals - continue mealtime sliding scale and nighttime sliding scale -Plan to discharge on insulin 70/30 due to lack of insurance  Tobacco use quit smoking in prior to admission  -Chantix   DVT prophylaxis: Lovenox Diet: Heart healthy carb modified Family Communication: No family at bedside Disposition Plan: Pending clinical stability, might be able  to discharge in a.m. with home O2   Subjective   Patient seen and examined sitting upright.  States she still has shortness  of breath which is improved since her admission.  Denies chest pain but admits to chest discomfort.  Admits to persistent cough.  States that her leg swelling has improved significantly and denies any other complaints.  Objective   Vitals:   08/06/19 0815 08/06/19 0824 08/06/19 1241 08/06/19 1405  BP:   (!) 142/69   Pulse:   83   Resp:   18   Temp:   98.5 F (36.9 C)   TempSrc:   Oral   SpO2: 90% 90%  95%  Weight:      Height:        Intake/Output Summary (Last 24 hours) at 08/06/2019 1655 Last data filed at 08/06/2019 1230 Gross per 24 hour  Intake 535.8 ml  Output 2900 ml  Net -2364.2 ml   Filed Weights   08/04/19 0500 08/05/19 0500 08/06/19 0459  Weight: (!) 141.1 kg (!) 139.5 kg (!) 139.4 kg    Examination:  Physical Exam Vitals signs and nursing note reviewed.  Constitutional:      Appearance: Normal appearance. She is obese.  HENT:     Head: Normocephalic and atraumatic.     Nose: Nose normal.     Mouth/Throat:     Mouth: Mucous membranes are moist.  Eyes:     Extraocular Movements: Extraocular movements intact.  Neck:     Musculoskeletal: Normal range of motion. No neck rigidity.  Cardiovascular:     Rate and Rhythm: Normal rate and regular rhythm.  Pulmonary:     Effort: Pulmonary effort is normal.     Breath sounds: Wheezing present.     Comments: Conversational dyspnea Diffuse wheeze Cough Abdominal:     General: Abdomen is flat.     Palpations: Abdomen is soft.  Musculoskeletal: Normal range of motion.        General: No swelling.  Neurological:     General: No focal deficit present.     Mental Status: She is alert. Mental status is at baseline.  Psychiatric:        Mood and Affect: Mood normal.        Behavior: Behavior normal.     Data Reviewed: I have personally reviewed following labs and imaging studies  CBC: Recent Labs  Lab 08/02/19 0610 08/02/19 1332 08/05/19 0355  WBC 4.6 4.8 4.8  NEUTROABS 3.0  --   --   HGB 16.3* 16.6*  15.5*  HCT 53.7* 54.4* 54.2*  MCV 92.7 91.4 95.8  PLT 170 172 [id number]   Basic Metabolic Panel: Recent Labs  Lab 08/02/19 0610 08/02/19 1332 08/03/19 0440 08/04/19 0350 08/05/19 0355 08/06/19 0422  NA 140  --  139 140 138 135  K 4.3  --  4.5 4.5 4.1 4.5  CL 100  --  96* 96* 94* 92*  CO2 30  --  34* 33* 34* 32  GLUCOSE 133*  --  152* 162* 152* 270*  BUN 11  --  12 19 17  22*  CREATININE 0.56 0.55 0.66 0.81 0.63 0.60  CALCIUM 9.0  --  9.2 9.5 8.9 9.2   GFR: Estimated Creatinine Clearance: 112.2 mL/min (by C-G formula based on SCr of 0.6 mg/dL). Liver Function Tests: No results for input(s): AST, ALT, ALKPHOS, BILITOT, PROT, ALBUMIN in the last 168 hours. No results for input(s): LIPASE, AMYLASE in the last 168 hours. No results  for input(s): AMMONIA in the last 168 hours. Coagulation Profile: No results for input(s): INR, PROTIME in the last 168 hours. Cardiac Enzymes: No results for input(s): CKTOTAL, CKMB, CKMBINDEX, TROPONINI in the last 168 hours. BNP (last 3 results) No results for input(s): PROBNP in the last 8760 hours. HbA1C: No results for input(s): HGBA1C in the last 72 hours. CBG: Recent Labs  Lab 08/05/19 1638 08/05/19 1931 08/06/19 0739 08/06/19 1238 08/06/19 1626  GLUCAP 284* 286* 414* 223* 260*   Lipid Profile: No results for input(s): CHOL, HDL, LDLCALC, TRIG, CHOLHDL, LDLDIRECT in the last 72 hours. Thyroid Function Tests: No results for input(s): TSH, T4TOTAL, FREET4, T3FREE, THYROIDAB in the last 72 hours. Anemia Panel: No results for input(s): VITAMINB12, FOLATE, FERRITIN, TIBC, IRON, RETICCTPCT in the last 72 hours. Sepsis Labs: Recent Labs  Lab 08/04/19 0350  PROCALCITON <0.10    Recent Results (from the past 240 hour(s))  SARS CORONAVIRUS 2 (TAT 6-24 HRS) Nasopharyngeal Nasopharyngeal Swab     Status: None   Collection Time: 08/02/19  8:05 AM   Specimen: Nasopharyngeal Swab  Result Value Ref Range Status   SARS Coronavirus 2 NEGATIVE  NEGATIVE Final    Comment: (NOTE) SARS-CoV-2 target nucleic acids are NOT DETECTED. The SARS-CoV-2 RNA is generally detectable in upper and lower respiratory specimens during the acute phase of infection. Negative results do not preclude SARS-CoV-2 infection, do not rule out co-infections with other pathogens, and should not be used as the sole basis for treatment or other patient management decisions. Negative results must be combined with clinical observations, patient history, and epidemiological information. The expected result is Negative. Fact Sheet for Patients: SugarRoll.be Fact Sheet for Healthcare Providers: https://www.woods-[place].com/ This test is not yet approved or cleared by the [place] FDA and  has been authorized for detection and/or diagnosis of SARS-CoV-2 by FDA under an Emergency Use Authorization (EUA). This EUA will remain  in effect (meaning this test can be used) for the duration of the COVID-19 declaration under Section 56 4(b)(1) of the Act, 21 U.S.C. section 360bbb-3(b)(1), unless the authorization is terminated or revoked sooner. Performed at [facility], [place] [address], [place], [place] [address]   Culture, blood (routine x 2)     Status: None (Preliminary result)   Collection Time: 08/04/19  7:34 AM   Specimen: BLOOD  Result Value Ref Range Status   Specimen Description   Final    BLOOD LEFT ARM Performed at [facility] [address] [place], [place] [address]    Special Requests   Final    BOTTLES DRAWN AEROBIC AND ANAEROBIC Blood Culture results may not be optimal due to an inadequate volume of blood received in culture bottles Performed at [place] [address], [place], [place] [address]    Culture   Final    NO GROWTH 2 DAYS Performed at [facility] [address] [place], [place] [address]    Report Status PENDING  Incomplete   Culture, blood (routine x 2)     Status: None (Preliminary result)   Collection Time: 08/04/19  7:34 AM   Specimen: BLOOD LEFT HAND  Result Value Ref Range Status   Specimen Description   Final    BLOOD LEFT HAND Performed at [facility] [address], [place], [place] [address]    Special Requests   Final    BOTTLES DRAWN AEROBIC ONLY Blood Culture results may not be optimal due  to an inadequate volume of blood received in culture bottles Performed at [facility] [address], [place], [place] [address]    Culture   Final    NO GROWTH 2 DAYS Performed at [facility] [address] [place], [place] [address]    Report Status PENDING  Incomplete         Radiology Studies: Dg Chest Port 1 View  Result Date: 08/06/2019 CLINICAL DATA:  Cough, shortness of breath EXAM: PORTABLE CHEST 1 VIEW COMPARISON:  01/28/2019 FINDINGS: Increased retrocardiac density. No large pleural effusion. No pneumothorax. Mild cardiomegaly. IMPRESSION: Retrocardiac density, which may reflect atelectasis/consolidation. This could also be artifactual given portable technique and body habitus. Mild cardiomegaly. Electronically Signed   By: Macy Mis M.D.   On: 08/06/2019 13:16        Scheduled Meds: . arformoterol  15 mcg Nebulization BID  . azithromycin  500 mg Oral Daily  . benzonatate  100 mg Oral TID  . budesonide (PULMICORT) nebulizer solution  0.25 mg Nebulization BID  . carvedilol  12.5 mg Oral BID WC  . enoxaparin (LOVENOX) injection  60 mg Subcutaneous Q24H  . fluticasone  1 spray Each Nare Daily  . furosemide  40 mg Intravenous Q12H  . gabapentin  300 mg Oral TID  . insulin aspart  0-15 Units Subcutaneous TID WC  . insulin aspart  0-5 Units Subcutaneous QHS  . insulin aspart  6 Units Subcutaneous TID WC  . insulin glargine  20 Units Subcutaneous QHS  . ipratropium-albuterol  3 mL Nebulization Q6H  . losartan  50 mg Oral Daily  .  predniSONE  50 mg Oral Q breakfast  . sodium chloride flush  3 mL Intravenous Q12H  . varenicline  0.5 mg Oral Daily   Continuous Infusions: . sodium chloride    . cefTRIAXone (ROCEPHIN)  IV 2 g (08/06/19 1532)     LOS: 4 days    Time spent: 30 minutes with over 50% of the time coordinating the patient's care    Harold Hedge, DO [facility] Pager [phone]  If 7PM-7AM, please contact night-coverage [email or web address] Password [email or web address] 08/06/2019, 4:55 PM

## 2019-08-06 NOTE — Progress Notes (Signed)
SATURATION QUALIFICATIONS: (This note is used to comply with regulatory documentation for home oxygen)  Patient Saturations on Room Air at Rest = 90%  Patient Saturations on Room Air while Ambulating = 82%  Patient Saturations on 2.5 Liters of oxygen while Ambulating = 92%  Please briefly explain why patient needs home oxygen:

## 2019-08-06 NOTE — TOC Initial Note (Signed)
Transition of Care [facility]) - Initial/Assessment Note    Patient Details  Name: Sharon Swanson MRN: [id number] Date of Birth: 1961-05-23  Transition of Care [facility]) CM/SW Contact:    Wende Neighbors, LCSW Phone Number: 08/06/2019, 1:18 PM  Clinical Narrative:      CSW met patient at bedside to discuss  discharge plans. Patient stated she lives at home with her amazing husband. Patient stated that her husband is super supportive and helps her when she needs it. Patient stated she does not have a problem getting around due to her husband being able to drive her to where she needs to go. Patient spoke to [place] about her concerns on being able to afford her medications. Patient stated she is no longer working and is only getting 200/week. Patient also stated that her husband is on a fix income so its hard for them to manage financially.   Patient stated that she also has a hard time affording her PCP and CSW offered to set her up with [facility] for a new PCP. Patient has a new PCP appointment for November 18 at 9:20am (information has been placed on AVS). CSW also contacted Financial Counselor to make a referral for them to come up and assess patient for Medicaid.  CSW will continue to follow for discharge needs. Patient will possibly need a [place] letter to assist with medications at discharge           Expected Discharge Plan: Home/Self Care Barriers to Discharge: Inadequate or no insurance, Continued Medical Work up   Patient Goals and CMS Choice        Expected Discharge Plan and Services Expected Discharge Plan: Home/Self Care In-house Referral: Clinical Social Work     Living arrangements for the past 2 months: Single Family Home                                      Prior Living Arrangements/Services Living arrangements for the past 2 months: Single Family Home Lives with:: Self, Spouse Patient language and need for interpreter reviewed:: Yes Do you feel safe  going back to the place where you live?: Yes      Need for Family Participation in Patient Care: Yes (Comment) Care giver support system in place?: Yes (comment)   Criminal Activity/Legal Involvement Pertinent to Current Situation/Hospitalization: No - Comment as needed  Activities of Daily Living Home Assistive Devices/Equipment: CBG Meter, Blood pressure cuff ADL Screening (condition at time of admission) Patient's cognitive ability adequate to safely complete daily activities?: Yes Is the patient deaf or have difficulty hearing?: No Does the patient have difficulty seeing, even when wearing glasses/contacts?: No Does the patient have difficulty concentrating, remembering, or making decisions?: No Patient able to express need for assistance with ADLs?: Yes Does the patient have difficulty dressing or bathing?: No Independently performs ADLs?: Yes (appropriate for developmental age) Does the patient have difficulty walking or climbing stairs?: No Weakness of Legs: Both Weakness of Arms/Hands: None  Permission Sought/Granted Permission sought to share information with : Family Supports    Share Information with NAME: Arlan Organ     Permission granted to share info w Relationship: sister  Permission granted to share info w Contact Information: [phone]  Emotional Assessment Appearance:: Appears stated age Attitude/Demeanor/Rapport: Engaged Affect (typically observed): Accepting, Stable Orientation: : Oriented to Self, Oriented to Place, Oriented to  Time, Oriented to Situation  Alcohol / Substance Use: Not Applicable Psych Involvement: No (comment)  Admission diagnosis:  Acute pulmonary edema (HCC) [J81.0] Peripheral edema [R60.9] Hypoxia [R09.02] Hypertensive emergency [I16.1] Intermittent left-sided chest pain [R07.89] Patient Active Problem List   Diagnosis Date Noted  . Acute respiratory failure with hypoxia ([place]) 08/02/2019  . Acute CHF (congestive heart failure)  ([place]) 08/02/2019  . Cellulitis 08/02/2019  . Insulin-requiring or dependent type II diabetes mellitus ([place]) 01/19/2017  . Essential hypertension 01/19/2017  . Anxiety 01/19/2017  . Chest pain 01/19/2017  . OSA (obstructive sleep apnea) 01/19/2017  . COPD (chronic obstructive pulmonary disease) ([place])   . Acute respiratory failure ([place]) 08/17/2014  . Umbilical hernia 33/82/5053  . Pulmonary nodule, left 08/17/2014  . Constipation, chronic 08/15/2012  . GERD (gastroesophageal reflux disease) 11/08/2011  . Tobacco abuse 11/08/2011  . Fatty liver 11/08/2011  . BMI 45.0-49.9, adult ([place]) 11/08/2011  . Fibroids 11/08/2011   PCP:  Eloise Levels, NP Pharmacy:   [facility] [phone] [place], [place] AT [place] [address] [place] [address] Phone: [phone] Fax: [phone]     Social Determinants of Health (SDOH) Interventions    Readmission Risk Interventions No flowsheet data found.

## 2019-08-06 NOTE — Plan of Care (Signed)
  Problem: Health Behavior/Discharge Planning: Goal: Ability to manage health-related needs will improve Outcome: Progressing   Problem: Clinical Measurements: Goal: Ability to maintain clinical measurements within normal limits will improve Outcome: Progressing Goal: Will remain free from infection Outcome: Progressing Goal: Diagnostic test results will improve Outcome: Progressing Goal: Respiratory complications will improve Outcome: Progressing Goal: Cardiovascular complication will be avoided Outcome: Progressing   Problem: Activity: Goal: Risk for activity intolerance will decrease Outcome: Progressing   Problem: Coping: Goal: Level of anxiety will decrease Outcome: Progressing   Problem: Elimination: Goal: Will not experience complications related to bowel motility Outcome: Progressing Goal: Will not experience complications related to urinary retention Outcome: Progressing   Problem: Safety: Goal: Ability to remain free from injury will improve Outcome: Progressing   Problem: Activity: Goal: Capacity to carry out activities will improve Outcome: Progressing   Problem: Cardiac: Goal: Ability to achieve and maintain adequate cardiopulmonary perfusion will improve Outcome: Progressing   Problem: Education: Goal: Ability to demonstrate management of disease process will improve Outcome: Adequate for Discharge Goal: Ability to verbalize understanding of medication therapies will improve Outcome: Adequate for Discharge   Problem: Education: Goal: Individualized Educational Video(s) Outcome: Not Applicable

## 2019-08-06 NOTE — Progress Notes (Signed)
Inpatient Diabetes Program Recommendations  AACE/ADA: New Consensus Statement on Inpatient Glycemic Control (2015)  Target Ranges:  Prepandial:   less than 140 mg/dL      Peak postprandial:   less than 180 mg/dL (1-2 hours)      Critically ill patients:  140 - 180 mg/dL   Lab Results  Component Value Date   GLUCAP 260 (H) 08/06/2019   HGBA1C 8.4 (H) 08/02/2019    Review of Glycemic Control  Diabetes history: DM2 Outpatient Diabetes medications: None Current orders for Inpatient glycemic control: Lantus 20 units QHS, Novolog 0-15 units tidwc and 0-5 units QHS + 6 units tidwc  HgbA1C - 8.4% - poor glycemic control Pt will need affordable insulin since lost insurance several weeks ago.  Inpatient Diabetes Program Recommendations:     Change to 70/30 20 units bid Increase Novolog to 0-20 units tidwc and 0-5 units QHS  For discharge:  Novolin 70/30 20 units bid Metformin 500 mg bid Blood glucose meter and supplies  Care management assistance with making appt at [facility] for diabetes management.  Will speak with pt in am regarding importance of good glycemic control and reducing HgbA1C to 7% to avoid risk of complications.  Continue to follow.  Thank you. Lorenda Peck, RD, LDN, CDE Inpatient Diabetes Coordinator [phone]

## 2019-08-07 DIAGNOSIS — L03119 Cellulitis of unspecified part of limb: Secondary | ICD-10-CM

## 2019-08-07 DIAGNOSIS — J9601 Acute respiratory failure with hypoxia: Secondary | ICD-10-CM

## 2019-08-07 LAB — CBC
HCT: 56.2 % — ABNORMAL HIGH (ref 36.0–46.0)
Hemoglobin: 16.6 g/dL — ABNORMAL HIGH (ref 12.0–15.0)
MCH: 27.9 pg (ref 26.0–34.0)
MCHC: 29.5 g/dL — ABNORMAL LOW (ref 30.0–36.0)
MCV: 94.5 fL (ref 80.0–100.0)
Platelets: 187 10*3/uL (ref 150–400)
RBC: 5.95 MIL/uL — ABNORMAL HIGH (ref 3.87–5.11)
RDW: 14.8 % (ref 11.5–15.5)
WBC: 5.9 10*3/uL (ref 4.0–10.5)
nRBC: 0 % (ref 0.0–0.2)

## 2019-08-07 LAB — GLUCOSE, CAPILLARY
Glucose-Capillary: 108 mg/dL — ABNORMAL HIGH (ref 70–99)
Glucose-Capillary: 240 mg/dL — ABNORMAL HIGH (ref 70–99)
Glucose-Capillary: 325 mg/dL — ABNORMAL HIGH (ref 70–99)
Glucose-Capillary: 260 mg/dL — ABNORMAL HIGH (ref 70–99)

## 2019-08-07 LAB — BASIC METABOLIC PANEL
Anion gap: 11 (ref 5–15)
BUN: 27 mg/dL — ABNORMAL HIGH (ref 6–20)
CO2: 33 mmol/L — ABNORMAL HIGH (ref 22–32)
Calcium: 9.1 mg/dL (ref 8.9–10.3)
Chloride: 94 mmol/L — ABNORMAL LOW (ref 98–111)
Creatinine, Ser: 0.64 mg/dL (ref 0.44–1.00)
GFR calc Af Amer: >60 mL/min (ref >60)
GFR calc non Af Amer: >60 mL/min (ref >60)
Glucose, Bld: 240 mg/dL — ABNORMAL HIGH (ref 70–99)
Potassium: 4.2 mmol/L (ref 3.5–5.1)
Sodium: 138 mmol/L (ref 135–145)

## 2019-08-07 LAB — MAGNESIUM: Magnesium: 2.3 mg/dL (ref 1.7–2.4)

## 2019-08-07 MED ORDER — IPRATROPIUM-ALBUTEROL 0.5-2.5 (3) MG/3ML IN SOLN
3.0000 mL | Freq: Three times a day (TID) | RESPIRATORY_TRACT | Status: DC
  Administered 2019-08-07: 3 mL via RESPIRATORY_TRACT
  Filled 2019-08-07: qty 3

## 2019-08-07 MED ORDER — POTASSIUM CHLORIDE CRYS ER 20 MEQ PO TBCR
40.0000 meq | EXTENDED_RELEASE_TABLET | Freq: Once | ORAL | Status: AC
  Administered 2019-08-07: 40 meq via ORAL
  Filled 2019-08-07: qty 2

## 2019-08-07 MED ORDER — INSULIN ASPART PROT & ASPART (70-30 MIX) 100 UNIT/ML ~~LOC~~ SUSP
20.0000 [IU] | Freq: Two times a day (BID) | SUBCUTANEOUS | Status: DC
  Administered 2019-08-07 – 2019-08-12 (×9): 20 [IU] via SUBCUTANEOUS
  Filled 2019-08-07: qty 10

## 2019-08-07 MED ORDER — IPRATROPIUM-ALBUTEROL 0.5-2.5 (3) MG/3ML IN SOLN
3.0000 mL | Freq: Two times a day (BID) | RESPIRATORY_TRACT | Status: DC
  Administered 2019-08-07 – 2019-08-10 (×6): 3 mL via RESPIRATORY_TRACT
  Filled 2019-08-07 (×6): qty 3

## 2019-08-07 MED ORDER — FUROSEMIDE 40 MG PO TABS
40.0000 mg | ORAL_TABLET | Freq: Every day | ORAL | Status: DC
  Administered 2019-08-07: 40 mg via ORAL
  Filled 2019-08-07: qty 1

## 2019-08-07 MED ORDER — FUROSEMIDE 10 MG/ML IJ SOLN
40.0000 mg | Freq: Two times a day (BID) | INTRAMUSCULAR | Status: DC
  Administered 2019-08-07 – 2019-08-10 (×6): 40 mg via INTRAVENOUS
  Filled 2019-08-07 (×6): qty 4

## 2019-08-07 MED ORDER — SALINE SPRAY 0.65 % NA SOLN
1.0000 | NASAL | Status: DC | PRN
  Administered 2019-08-07: 1 via NASAL
  Filled 2019-08-07: qty 44

## 2019-08-07 MED ORDER — PANTOPRAZOLE SODIUM 40 MG PO TBEC
40.0000 mg | DELAYED_RELEASE_TABLET | Freq: Two times a day (BID) | ORAL | Status: DC
  Administered 2019-08-07 – 2019-08-08 (×3): 40 mg via ORAL
  Filled 2019-08-07 (×3): qty 1

## 2019-08-07 MED ORDER — INSULIN ASPART 100 UNIT/ML ~~LOC~~ SOLN
0.0000 [IU] | Freq: Three times a day (TID) | SUBCUTANEOUS | Status: DC
  Administered 2019-08-07: 7 [IU] via SUBCUTANEOUS

## 2019-08-07 NOTE — Plan of Care (Signed)
  Problem: Health Behavior/Discharge Planning: Goal: Ability to manage health-related needs will improve Outcome: Progressing   Problem: Clinical Measurements: Goal: Respiratory complications will improve Outcome: Progressing Goal: Cardiovascular complication will be avoided Outcome: Progressing   Problem: Activity: Goal: Risk for activity intolerance will decrease Outcome: Progressing   Problem: Elimination: Goal: Will not experience complications related to bowel motility Outcome: Progressing Goal: Will not experience complications related to urinary retention Outcome: Progressing   Problem: Safety: Goal: Ability to remain free from injury will improve Outcome: Progressing   Problem: Activity: Goal: Capacity to carry out activities will improve Outcome: Progressing

## 2019-08-07 NOTE — Consult Note (Signed)
NAME:  Sharon Swanson, MRN:  [id number], DOB:  1961/09/14, LOS: 5 ADMISSION DATE:  08/02/2019, CONSULTATION DATE:  08/07/2019 REFERRING MD:  Marva Panda, MD CHIEF COMPLAINT:  SOB   Brief History   58 y/o morbidly obese female with acute hypoxic respiratory failure,  admitted 11/7 accelerated HTN, decompensated OSA/OHS, w/ decompensated cor pulmonale and diastolic dysfxn.   ->had not been on meds for over 2 months ->previously advised to be on CPAP but could not afford co-pay History of present illness   This 58 y/o morbidly obese female with PMH as below presented to the ED 08/02/19 with c/o SOB, DOE, orthopnea and bilat lower leg swelling x 2 weeks.  She also reported fever of 101 and nonproductive cough x 1 day.  Of note, pt is unemployed, thus has lost her medical insurance and reports having stopped taking her medications a few months ago, and she is unable to afford her CPAP machine.  Her BP was 201/108 upon arrival to the ED and she was mildly hypoxic, requiring 2 L O2 to maintain spO2 > 90%.  Since admission, she has been treated with empiric abx to cover for CAP and RLE cellulitis.  CTA neg for PE.  She has been diuresed for CHF exacerbation.   She remained hypoxic w/ upper airway wheezing and therefore pulmonary asked to eval.  ->on time of evaluation: 5 liter [place] 95% (supine position)  Past Medical History  HTN COPD Type 2 DM, insulin-dependent Asthma Obesity BMI 48.41 OSA  Consults:  11/12 >> PCCM   Significant Diagnostic Tests:  CTA Chest 11/7 >> neg for PE 2D Echo 11/7 >> EF 60-65%, moderate LVH, normal LV and RV function LE Venous Duplex 11/7 >> neg for DVT PCXR 11/11 >> Atelectasis vs consolidation vs effusion LLL  Micro Data:  SARS-CoV2 11/7 >> neg Blood cx 11/7 >> no growth 3 days   Antimicrobials:  11/7 PO Keflex >> stopped 11/9 11/9 zithromax >>  11/9 rocephin >>   Interim history/subjective:  Reports that WOB is "a little better" today but continues to  endorse DOE and SOB when coughing.  States, "I want to go home but not until I'm better."  Objective   Blood pressure (Abnormal) 159/98, pulse 86, temperature 97.9 F (36.6 C), temperature source Oral, resp. rate 19, height 5' 7"  (1.702 m), weight (Abnormal) 140.2 kg, SpO2 94 %.    FiO2 (%):  [89 %] 89 %   Intake/Output Summary (Last 24 hours) at 08/07/2019 1135 Last data filed at 08/07/2019 1045 Gross per 24 hour  Intake 1303 ml  Output 2600 ml  Net -1297 ml  sats 95% 5 liters  Filed Weights   08/05/19 0500 08/06/19 0459 08/07/19 0500  Weight: (Abnormal) 139.5 kg (Abnormal) 139.4 kg (Abnormal) 140.2 kg    Examination: General: Obese woman resting comfortably in bed, no apparent distress. HENT: PERRL, nares clear, expiratory wheezes heard over throat Lungs: Resp effort WNL, scattered wheezes, on 5 L O2 with spO2 95%. Cardiovascular: RRR, no MGR Abdomen: Obese, soft, nontender. Extremities: No edema, distal pulses intact. RLE TTP. Neuro: Alert and oriented, pleasant.   Resolved Hospital Problem list   Cellulitis resolved s/p ceftriaxone  Assessment & Plan:   Acute hypoxic respiratory failure in the setting of pulmonary edema due to  decompensated diastolic HF and cor pulmonale secondary to untreated OSA, and accelerated HTN  ->she has h/o air trapping on CT but no obstruction on spirometry  Plan: Aggressive diuresis as renal function and BP  will allow Change PO lasix to 40 mg IV BID Continue antihypertensives CPAP qhs Wean O2 as able though she will likely need oxygen at home (will need walking oximetry prior to dc) Repeat PSG upon discharge; she needs a CPAP but financial barriers will be challenging  We will set her up for close f/u  Wheezing w/ possible CAP.  Her wheezing is predominately upper airway and she freely endorses reflux. I am not convinced she ever had PNA. Further more we know that she did not have obstruction on PFTs.  Plan Increase PPI Stop steroids  Could complete 5 days abx  Can use PRN Nebs   HTN Plan Continue antihypertensives Case management working on plan for medications after discharge  Morbid obesity Plan Counseled on importance of weight loss to gain control of comorbidities   Labs   CBC: Recent Labs  Lab 08/02/19 0610 08/02/19 1332 08/05/19 0355 08/07/19 0332  WBC 4.6 4.8 4.8 5.9  NEUTROABS 3.0  --   --   --   HGB 16.3* 16.6* 15.5* 16.6*  HCT 53.7* 54.4* 54.2* 56.2*  MCV 92.7 91.4 95.8 94.5  PLT 170 172 177 629    Basic Metabolic Panel: Recent Labs  Lab 08/03/19 0440 08/04/19 0350 08/05/19 0355 08/06/19 0422 08/07/19 0332  NA 139 140 138 135 138  K 4.5 4.5 4.1 4.5 4.2  CL 96* 96* 94* 92* 94*  CO2 34* 33* 34* 32 33*  GLUCOSE 152* 162* 152* 270* 240*  BUN 12 19 17  22* 27*  CREATININE 0.66 0.81 0.63 0.60 0.64  CALCIUM 9.2 9.5 8.9 9.2 9.1  MG  --   --   --   --  2.3   GFR: Estimated Creatinine Clearance: 112.5 mL/min (by C-G formula based on SCr of 0.64 mg/dL). Recent Labs  Lab 08/02/19 0610 08/02/19 1332 08/04/19 0350 08/05/19 0355 08/07/19 0332  PROCALCITON  --   --  <0.10  --   --   WBC 4.6 4.8  --  4.8 5.9    Liver Function Tests: No results for input(s): AST, ALT, ALKPHOS, BILITOT, PROT, ALBUMIN in the last 168 hours. No results for input(s): LIPASE, AMYLASE in the last 168 hours. No results for input(s): AMMONIA in the last 168 hours.  ABG    Component Value Date/Time   TCO2 29 08/04/2016 1728     Coagulation Profile: No results for input(s): INR, PROTIME in the last 168 hours.  Cardiac Enzymes: No results for input(s): CKTOTAL, CKMB, CKMBINDEX, TROPONINI in the last 168 hours.  HbA1C: Hgb A1c MFr Bld  Date/Time Value Ref Range Status  08/02/2019 01:32 PM 8.4 (H) 4.8 - 5.6 % Final    Comment:    (NOTE) Pre diabetes:          5.7%-6.4% Diabetes:              >6.4% Glycemic control for   <7.0% adults with diabetes   01/19/2017 07:50 PM 7.0 (H) 4.8 - 5.6 % Final     Comment:    (NOTE)         Pre-diabetes: 5.7 - 6.4         Diabetes: >6.4         Glycemic control for adults with diabetes: <7.0     CBG: Recent Labs  Lab 08/06/19 1238 08/06/19 1626 08/06/19 2005 08/07/19 0723 08/07/19 1106  GLUCAP 223* 260* 302* 108* 240*    Review of Systems:   Review of Systems  Constitutional: Negative  for chills, fever and malaise/fatigue.  HENT: Negative for congestion, sinus pain and sore throat.   Eyes: Negative.   Respiratory: Positive for cough, shortness of breath and wheezing. Negative for sputum production.        Upper airway expiratory wheezing  Cardiovascular: Positive for orthopnea and leg swelling. Negative for chest pain and palpitations.  Gastrointestinal: Positive for heartburn. Negative for abdominal pain, nausea and vomiting.  Genitourinary: Negative.   Musculoskeletal: Negative.   Skin: Positive for rash.       RLE cellulitis  Neurological: Negative.   Endo/Heme/Allergies: Negative.   Psychiatric/Behavioral: Negative.     Past Medical History  She,  has a past medical history of Acid reflux, Asthma, COPD (chronic obstructive pulmonary disease) ([place]), Diabetes mellitus without complication ([place]), Fatty liver (11/08/2011), Fibroids (11/08/2011), Hypertension, Obesity (BMI 30-39.9) (11/08/2011), Osteoarthritis of right acromioclavicular joint, Pancreatitis (11/08/2011), Rotator cuff tear, right, Sleep apnea, and Ventral hernia.   Surgical History    Past Surgical History:  Procedure Laterality Date  . ORTHOPEDIC SURGERY     R ankle, tendonitis     Social History   reports that she has been smoking cigarettes. She has been smoking about 0.50 packs per day. She has never used smokeless tobacco. She reports that she does not drink alcohol or use drugs.   Family History   Her family history includes Diabetes in an other family member; Hypertension in her mother.   Allergies Allergies  Allergen Reactions  . Aspirin Other (See  Comments)    Abdominal pain     Home Medications  Prior to Admission medications   Medication Sig Start Date End Date Taking? Authorizing Provider  naproxen sodium (ALEVE) 220 MG tablet Take 440 mg by mouth 2 (two) times daily as needed (pain/headache).   Yes [provider]  polyvinyl alcohol (LIQUIFILM TEARS) 1.4 % ophthalmic solution Place 1 drop into both eyes as needed for dry eyes.   Yes [provider]  albuterol (PROVENTIL HFA;VENTOLIN HFA) 108 (90 Base) MCG/ACT inhaler Inhale 2 puffs into the lungs every 4 (four) hours as needed for wheezing or shortness of breath. Patient not taking: Reported on 08/02/2019 12/20/16   Marshell Garfinkel, MD  albuterol (PROVENTIL) (2.5 MG/3ML) 0.083% nebulizer solution Take 3 mLs (2.5 mg total) by nebulization every 6 (six) hours as needed for wheezing or shortness of breath. Patient not taking: Reported on 08/02/2019 07/04/17   Varney Biles, MD  albuterol (PROVENTIL) (5 MG/ML) 0.5% nebulizer solution Take 1 mL (5 mg total) by nebulization every 6 (six) hours as needed for wheezing or shortness of breath. Patient not taking: Reported on 08/02/2019 10/01/18   Charlann Lange, PA-C  azithromycin (ZITHROMAX) 250 MG tablet Take 1 tablet (250 mg total) by mouth daily. Take first 2 tablets together, then 1 every day until finished. Patient not taking: Reported on 10/01/2018 07/04/17   Varney Biles, MD  docusate sodium (COLACE) 100 MG capsule Take 1 capsule (100 mg total) by mouth 3 (three) times daily as needed. Patient not taking: Reported on 10/01/2018 04/09/18   Grier Mitts, PA-C  fluticasone [facility]) 50 MCG/ACT nasal spray Place 1 spray into both nostrils daily. Patient not taking: Reported on 08/02/2019 06/11/16   Hollice Gong, Mir Mohammed, MD  Fluticasone-Salmeterol (ADVAIR DISKUS) 250-50 MCG/DOSE AEPB Inhale 1 puff into the lungs 2 (two) times daily. Patient not taking: Reported on 10/01/2018 12/20/16   Marshell Garfinkel, MD  gabapentin  (NEURONTIN) 300 MG capsule Take 1 capsule (300 mg total) by mouth 3 (  three) times daily. Patient not taking: Reported on 08/02/2019 07/02/18   Virgel Manifold, MD  glucose monitoring kit (FREESTYLE) monitoring kit 1 each by Does not apply route as needed for other. 06/10/16   Hollice Gong, Mir Earlie Server, MD  guaiFENesin (ROBITUSSIN) 100 MG/5ML SOLN Take 15 mLs (300 mg total) by mouth every 6 (six) hours as needed for cough or to loosen phlegm. Patient not taking: Reported on 10/01/2018 01/20/17   Murlean Iba, MD  hydrochlorothiazide (MICROZIDE) 12.5 MG capsule Take 1 capsule (12.5 mg total) by mouth daily. Patient not taking: Reported on 10/01/2018 06/10/16   Tomma Rakers, MD  ibuprofen (ADVIL,MOTRIN) 600 MG tablet Take 1 tablet (600 mg total) by mouth every 6 (six) hours as needed. Patient not taking: Reported on 10/01/2018 07/02/18   Virgel Manifold, MD  insulin aspart (NOVOLOG) 100 UNIT/ML injection Inject 5 Units into the skin 3 (three) times daily with meals. Patient not taking: Reported on 10/01/2018 06/10/16   Hollice Gong, Mir Mohammed, MD  insulin glargine (LANTUS) 100 UNIT/ML injection Inject 0.12 mLs (12 Units total) into the skin daily. Patient not taking: Reported on 10/01/2018 06/11/16   Hollice Gong, Mir Mohammed, MD  ipratropium (ATROVENT) 0.02 % nebulizer solution Take 2.5 mLs (0.5 mg total) by nebulization 4 (four) times daily. Patient not taking: Reported on 10/01/2018 01/14/17   Charlesetta Shanks, MD  ipratropium-albuterol (DUONEB) 0.5-2.5 (3) MG/3ML SOLN Take 3 mLs by nebulization every 6 (six) hours. Patient not taking: Reported on 10/01/2018 08/22/14   Hosie Poisson, MD  meclizine (ANTIVERT) 25 MG tablet Take 1 tablet (25 mg total) by mouth 3 (three) times daily as needed for dizziness. Patient not taking: Reported on 10/01/2018 08/05/16   Leo Grosser, MD  methocarbamol (ROBAXIN) 500 MG tablet Take 1 tablet (500 mg total) by mouth 3 (three) times daily. Patient not taking: Reported on  10/01/2018 04/09/18   Grier Mitts, PA-C  mometasone-formoterol (DULERA) 100-5 MCG/ACT AERO Inhale 2 puffs into the lungs 2 (two) times daily. Patient not taking: Reported on 10/01/2018 07/04/17   Varney Biles, MD  morphine (MSIR) 15 MG tablet Take 1 tablet (15 mg total) by mouth every 4 (four) hours as needed for severe pain. Patient not taking: Reported on 10/01/2018 12/20/17   Deno Etienne, DO  oxyCODONE-acetaminophen (PERCOCET/ROXICET) 5-325 MG tablet Take 1 tablet by mouth every 4 (four) hours as needed for severe pain. Patient not taking: Reported on 08/02/2019 10/01/18   Charlann Lange, PA-C  predniSONE (DELTASONE) 20 MG tablet Take 2 tablets (40 mg total) by mouth daily. Patient not taking: Reported on 10/01/2018 07/02/18   Virgel Manifold, MD     Erick Colace ACNP-BC [place] Pager # [phone] OR # [phone] if no answer

## 2019-08-07 NOTE — Progress Notes (Signed)
   08/07/19 0045 08/07/19 0046 08/07/19 0050  Oxygen Therapy  SpO2 (!) 82 % (!) 85 % 90 %  O2 Device Nasal Cannula Nasal Cannula Nasal Cannula  O2 Flow Rate (L/min) 2 L/min 3 L/min 3.5 L/min    08/07/19 0059  Oxygen Therapy  SpO2 92 %  O2 Device Nasal Cannula  O2 Flow Rate (L/min) 4 L/min   Pt's oxygen saturation dropped to 82% while asleep . RN gradually increased the Oxygen to 4L . RN asked patient if she would like to try the CPAP, she said no. RN will continue to monitor pt's oxygen saturation.

## 2019-08-07 NOTE — Plan of Care (Signed)
Patient lying in bed this morning; pain controlled at this time. No needs expressed. Will continue to monitor.  

## 2019-08-07 NOTE — Progress Notes (Signed)
Assessment agreement from prior RN.

## 2019-08-07 NOTE — Progress Notes (Signed)
Inpatient Diabetes Program Recommendations  AACE/ADA: New Consensus Statement on Inpatient Glycemic Control (2015)  Target Ranges:  Prepandial:   less than 140 mg/dL      Peak postprandial:   less than 180 mg/dL (1-2 hours)      Critically ill patients:  140 - 180 mg/dL   Lab Results  Component Value Date   GLUCAP 240 (H) 08/07/2019   HGBA1C 8.4 (H) 08/02/2019    Review of Glycemic Control  Long discussion with pt regarding her glycemic control and importance of taking insulin as prescribed and obtaining PCP for her diabetes management. Pt states she lost her job and insurance and has been without insulin for approx 1 year. Has blood glucose meter and strips at home to check blood sugars. Discussed diet, portion control, getting exercise and stress management - lifestyle modifications, to control blood sugars. Stressed importance of weight loss. Discussed HgbA1C of 8.4% and goal of 7%.  TOC assisted with getting PCP. We discussed being discharged on Novolin 70/30 (ReliOn brand) and pt states she can afford the $25 cost of prescription at [facility].  TOC Social Worker has made appt for pt at [facility] on 11/18 at 9:20 am.  Inpatient Diabetes Program Recommendations:     To start 70/30 20 units bid today at 1700.  Will follow closely for titration needs.   Thank you. Lorenda Peck, RD, LDN, CDE Inpatient Diabetes Coordinator [phone]

## 2019-08-07 NOTE — Progress Notes (Addendum)
PROGRESS NOTE    Sharon Swanson    Code Status: Full Code  [id number] DOB: December 12, 1960 DOA: 08/02/2019  PCP: Eloise Levels, NP    Hospital Summary  Patient is a 58 year old female with history of hypertension, COPD, diabetes mellitus, IDDM, smoking, just quit presented to ED with 2 weeks of worsening shortness of breath. Per patient, she had stopped taking her medication 2 weeks ago as she lost her insurance. She started noticing shortness of breath, worse with exertion, also has not been able to sleep, has been sitting up on the couch for last 2 weeks. She also noticed swelling in her both lower legs, worsening. Right lower leg was becoming more tight, tender and red. Patient reported that she had a fever of 101 F last night with coughing. She also reports chest tightness with exertion. No nausea or vomiting, stated she had diarrhea last week. No recent long distance travels. Patient has been home due to work injury since April 2020. COVID-19 test negative BP 201/108 at the time of admission.  Since being hospitalized patient has been treated with IV Lasix for diastolic heart failure exacerbation as well as antibiotics for CAP and right lower extremity cellulitis.  Symptoms have improved but not resolved  A & P   Principal Problem:   Acute respiratory failure with hypoxia (HCC) Active Problems:   BMI 45.0-49.9, adult (HCC)   COPD (chronic obstructive pulmonary disease) (HCC)   Insulin-requiring or dependent type II diabetes mellitus (HCC)   Essential hypertension   Acute CHF (congestive heart failure) (HCC)   Cellulitis   Acute hypoxic respiratory failure secondary to Acute Diastolic Heart Failure Exacerbation and community-acquired pneumonia and COPD exacerbation  Desat overnight to 80s and reportedly refused CPAP. Chest x-ray today: Retrocardiac density which could be atelectasis/consolidation or artifact given body habitus.  CTA chest negative for PE 11/7.  -Pulmonology  consult -Consider discontinuing Rocephin/azithromycin -Sputum culture -Continue prednisone -Continue to titrate oxygen as needed -Continue nebs  Community-acquired pneumonia afebrile, CBC not drawn today, procalcitonin negative, may have been viral in etiology. Had a fever on presentaiton.  Day 4/5 azithromycin/doxycycline.   procalcitonin negative, afebrile and without leukocytosis - Consider discontinuing CAP therapy. Appreciate pulm recommendations - pulm consulted   COPD exacerbation secondary to CAP and heart failure exacerbation - continue Nebs and steroids - Pulm consulted -Sputum culture  Cellulitis resolved with ceftriaxone  Accelerated hypertension with acute heart failure was not taking any of her antihypertensives at home due to lack of insurance.  Echo with LVH. Appears euvolemic on exam -Continue increased losartan 50 mg daily -Continue Coreg 12.5 mg twice daily -Change to PO lasix  -Social work consult  Morbid obesity -Counseled on diet and weight control  [facility] cannot afford her home CPAP machine. Miscommunication regarding CPAP overnight: Nursing reported patient refused CPAP, however patient states this is not the case.  She desatted to 27s.  -CPAP nightly while inpatient -Social work consulted  Steroid induced hyperglycemia in setting of poorly controlled diabetes HA1C 8.4.  -Diabetic coordinator consulted: Change insulin to Insulin 70/30 20 u BID and continue night sliding scale -Plan to discharge on insulin 70/30 20 u BID and metformin BID due to lack of insurance   Tobacco use quit smoking in prior to admission  -Chantix   DVT prophylaxis: Lovenox Diet: Heart healthy carb modified Family Communication: No family at bedside Disposition Plan: Pending clinical stability, might be able to discharge in a.m. with home O2   Subjective   Seen and examined  at bedside sitting at an angle in no acute distress. Overnight patient had an episode of desaturation to  80s but refused CPAP per nursing. Patient states she did not refuse her CPAP and states this must have been a miscommunication. She is willing to try CPAP tonight.   Patient admits to persistent SOB on exertion and wheeze. States that she gets tired with the pulmonology exam.   Objective   Vitals:   08/07/19 0140 08/07/19 0400 08/07/19 0500 08/07/19 0754  BP:  (!) 159/98    Pulse:  86    Resp:  19    Temp:  97.9 F (36.6 C)    TempSrc:  Oral    SpO2: 92% 93%  94%  Weight:   (!) 140.2 kg   Height:        Intake/Output Summary (Last 24 hours) at 08/07/2019 1144 Last data filed at 08/07/2019 1045 Gross per 24 hour  Intake 1303 ml  Output 2600 ml  Net -1297 ml   Filed Weights   08/05/19 0500 08/06/19 0459 08/07/19 0500  Weight: (!) 139.5 kg (!) 139.4 kg (!) 140.2 kg    Examination:  Physical Exam Vitals signs and nursing note reviewed.  Constitutional:      Appearance: Normal appearance.  HENT:     Head: Normocephalic and atraumatic.     Nose: Nose normal.     Mouth/Throat:     Mouth: Mucous membranes are moist.  Neck:     Musculoskeletal: Normal range of motion. No neck rigidity.  Cardiovascular:     Rate and Rhythm: Normal rate and regular rhythm.  Pulmonary:     Effort: Pulmonary effort is normal.     Breath sounds: Wheezing present.     Comments: Diffuse wheeze On Nasal canula  Abdominal:     General: Abdomen is flat.     Palpations: Abdomen is soft.  Musculoskeletal:        General: No swelling.     Right lower leg: No edema.     Left lower leg: No edema.     Comments: No erythema  Neurological:     General: No focal deficit present.     Mental Status: She is alert. Mental status is at baseline.  Psychiatric:        Mood and Affect: Mood normal.        Behavior: Behavior normal.     Data Reviewed: I have personally reviewed following labs and imaging studies  CBC: Recent Labs  Lab 08/02/19 0610 08/02/19 1332 08/05/19 0355 08/07/19 0332   WBC 4.6 4.8 4.8 5.9  NEUTROABS 3.0  --   --   --   HGB 16.3* 16.6* 15.5* 16.6*  HCT 53.7* 54.4* 54.2* 56.2*  MCV 92.7 91.4 95.8 94.5  PLT 170 172 177 [id number]   Basic Metabolic Panel: Recent Labs  Lab 08/03/19 0440 08/04/19 0350 08/05/19 0355 08/06/19 0422 08/07/19 0332  NA 139 140 138 135 138  K 4.5 4.5 4.1 4.5 4.2  CL 96* 96* 94* 92* 94*  CO2 34* 33* 34* 32 33*  GLUCOSE 152* 162* 152* 270* 240*  BUN 12 19 17  22* 27*  CREATININE 0.66 0.81 0.63 0.60 0.64  CALCIUM 9.2 9.5 8.9 9.2 9.1  MG  --   --   --   --  2.3   GFR: Estimated Creatinine Clearance: 112.5 mL/min (by C-G formula based on SCr of 0.64 mg/dL). Liver Function Tests: No results for input(s): AST, ALT, ALKPHOS, BILITOT, PROT,  ALBUMIN in the last 168 hours. No results for input(s): LIPASE, AMYLASE in the last 168 hours. No results for input(s): AMMONIA in the last 168 hours. Coagulation Profile: No results for input(s): INR, PROTIME in the last 168 hours. Cardiac Enzymes: No results for input(s): CKTOTAL, CKMB, CKMBINDEX, TROPONINI in the last 168 hours. BNP (last 3 results) No results for input(s): PROBNP in the last 8760 hours. HbA1C: No results for input(s): HGBA1C in the last 72 hours. CBG: Recent Labs  Lab 08/06/19 1238 08/06/19 1626 08/06/19 2005 08/07/19 0723 08/07/19 1106  GLUCAP 223* 260* 302* 108* 240*   Lipid Profile: No results for input(s): CHOL, HDL, LDLCALC, TRIG, CHOLHDL, LDLDIRECT in the last 72 hours. Thyroid Function Tests: No results for input(s): TSH, T4TOTAL, FREET4, T3FREE, THYROIDAB in the last 72 hours. Anemia Panel: No results for input(s): VITAMINB12, FOLATE, FERRITIN, TIBC, IRON, RETICCTPCT in the last 72 hours. Sepsis Labs: Recent Labs  Lab 08/04/19 0350  PROCALCITON <0.10    Recent Results (from the past 240 hour(s))  SARS CORONAVIRUS 2 (TAT 6-24 HRS) Nasopharyngeal Nasopharyngeal Swab     Status: None   Collection Time: 08/02/19  8:05 AM   Specimen: Nasopharyngeal  Swab  Result Value Ref Range Status   SARS Coronavirus 2 NEGATIVE NEGATIVE Final    Comment: (NOTE) SARS-CoV-2 target nucleic acids are NOT DETECTED. The SARS-CoV-2 RNA is generally detectable in upper and lower respiratory specimens during the acute phase of infection. Negative results do not preclude SARS-CoV-2 infection, do not rule out co-infections with other pathogens, and should not be used as the sole basis for treatment or other patient management decisions. Negative results must be combined with clinical observations, patient history, and epidemiological information. The expected result is Negative. Fact Sheet for Patients: SugarRoll.be Fact Sheet for Healthcare Providers: https://www.woods-[place].com/ This test is not yet approved or cleared by the [place] FDA and  has been authorized for detection and/or diagnosis of SARS-CoV-2 by FDA under an Emergency Use Authorization (EUA). This EUA will remain  in effect (meaning this test can be used) for the duration of the COVID-19 declaration under Section 56 4(b)(1) of the Act, 21 U.S.C. section 360bbb-3(b)(1), unless the authorization is terminated or revoked sooner. Performed at [facility], [place] [address], [place], [place] [address]   Culture, blood (routine x 2)     Status: None (Preliminary result)   Collection Time: 08/04/19  7:34 AM   Specimen: BLOOD  Result Value Ref Range Status   Specimen Description   Final    BLOOD LEFT ARM Performed at [facility] [address], [place], [place] [address]    Special Requests   Final    BOTTLES DRAWN AEROBIC AND ANAEROBIC Blood Culture results may not be optimal due to an inadequate volume of blood received in culture bottles Performed at [facility] [address], [place], [place] [address]    Culture   Final    NO GROWTH 3 DAYS Performed at [facility], [place] [address], [place], [place] [address]    Report Status PENDING  Incomplete  Culture, blood (routine x 2)     Status: None (Preliminary result)   Collection Time: 08/04/19  7:34 AM   Specimen: BLOOD LEFT HAND  Result Value Ref Range Status   Specimen Description   Final    BLOOD LEFT HAND Performed at [facility] [address], [place], [place] [address]    Special  Requests   Final    BOTTLES DRAWN AEROBIC ONLY Blood Culture results may not be optimal due to an inadequate volume of blood received in culture bottles Performed at [facility] [address] [place], [place] [address]    Culture   Final    NO GROWTH 3 DAYS Performed at [facility], [place] [address] [place], [place] [address]    Report Status PENDING  Incomplete         Radiology Studies: Dg Chest Port 1 View  Result Date: 08/06/2019 CLINICAL DATA:  Cough, shortness of breath EXAM: PORTABLE CHEST 1 VIEW COMPARISON:  01/28/2019 FINDINGS: Increased retrocardiac density. No large pleural effusion. No pneumothorax. Mild cardiomegaly. IMPRESSION: Retrocardiac density, which may reflect atelectasis/consolidation. This could also be artifactual given portable technique and body habitus. Mild cardiomegaly. Electronically Signed   By: Macy Mis M.D.   On: 08/06/2019 13:16        Scheduled Meds: . arformoterol  15 mcg Nebulization BID  . azithromycin  500 mg Oral Daily  . benzonatate  100 mg Oral TID  . budesonide (PULMICORT) nebulizer solution  0.25 mg Nebulization BID  . carvedilol  12.5 mg Oral BID WC  . enoxaparin (LOVENOX) injection  60 mg Subcutaneous Q24H  . fluticasone  1 spray Each Nare Daily  . furosemide  40 mg Intravenous Q12H  . gabapentin  300 mg Oral TID  . insulin aspart  0-20 Units Subcutaneous TID WC  . insulin aspart  0-5 Units Subcutaneous QHS  . insulin aspart protamine- aspart  20 Units Subcutaneous BID WC  . ipratropium-albuterol  3 mL Nebulization  BID  . losartan  50 mg Oral Daily  . predniSONE  50 mg Oral Q breakfast  . sodium chloride flush  3 mL Intravenous Q12H  . varenicline  0.5 mg Oral Daily   Continuous Infusions: . sodium chloride    . cefTRIAXone (ROCEPHIN)  IV 2 g (08/06/19 1532)     LOS: 5 days    Time spent: 30 minutes with over 50% of the time coordinating the patient's care    Harold Hedge, DO [facility] Pager [phone]  If 7PM-7AM, please contact night-coverage [email or web address] Password [facility] 08/07/2019, 11:44 AM
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
## 2019-08-08 LAB — BASIC METABOLIC PANEL
Anion gap: 11 (ref 5–15)
BUN: 30 mg/dL — ABNORMAL HIGH (ref 6–20)
CO2: 37 mmol/L — ABNORMAL HIGH (ref 22–32)
Calcium: 9 mg/dL (ref 8.9–10.3)
Chloride: 92 mmol/L — ABNORMAL LOW (ref 98–111)
Creatinine, Ser: 0.73 mg/dL (ref 0.44–1.00)
GFR calc Af Amer: >60 mL/min (ref >60)
GFR calc non Af Amer: >60 mL/min (ref >60)
Glucose, Bld: 105 mg/dL — ABNORMAL HIGH (ref 70–99)
Potassium: 4.3 mmol/L (ref 3.5–5.1)
Sodium: 140 mmol/L (ref 135–145)

## 2019-08-08 LAB — CBC
HCT: 56.3 % — ABNORMAL HIGH (ref 36.0–46.0)
Hemoglobin: 16.5 g/dL — ABNORMAL HIGH (ref 12.0–15.0)
MCH: 27.7 pg (ref 26.0–34.0)
MCHC: 29.3 g/dL — ABNORMAL LOW (ref 30.0–36.0)
MCV: 94.5 fL (ref 80.0–100.0)
Platelets: 195 10*3/uL (ref 150–400)
RBC: 5.96 MIL/uL — ABNORMAL HIGH (ref 3.87–5.11)
RDW: 14.9 % (ref 11.5–15.5)
WBC: 5.2 10*3/uL (ref 4.0–10.5)
nRBC: 0 % (ref 0.0–0.2)

## 2019-08-08 LAB — GLUCOSE, CAPILLARY
Glucose-Capillary: 127 mg/dL — ABNORMAL HIGH (ref 70–99)
Glucose-Capillary: 131 mg/dL — ABNORMAL HIGH (ref 70–99)
Glucose-Capillary: 196 mg/dL — ABNORMAL HIGH (ref 70–99)
Glucose-Capillary: 188 mg/dL — ABNORMAL HIGH (ref 70–99)

## 2019-08-08 MED ORDER — NYSTATIN 100000 UNIT/ML MT SUSP
5.0000 mL | Freq: Four times a day (QID) | OROMUCOSAL | Status: DC
  Administered 2019-08-08 – 2019-08-10 (×10): 500000 [IU] via ORAL
  Filled 2019-08-08 (×13): qty 5

## 2019-08-08 MED ORDER — TRAMADOL HCL 50 MG PO TABS
50.0000 mg | ORAL_TABLET | Freq: Once | ORAL | Status: AC
  Administered 2019-08-09: 50 mg via ORAL
  Filled 2019-08-08: qty 1

## 2019-08-08 MED ORDER — PANTOPRAZOLE SODIUM 40 MG PO TBEC
40.0000 mg | DELAYED_RELEASE_TABLET | Freq: Two times a day (BID) | ORAL | Status: DC
  Administered 2019-08-08 – 2019-08-12 (×8): 40 mg via ORAL
  Filled 2019-08-08 (×7): qty 1

## 2019-08-08 NOTE — Progress Notes (Signed)
NAME:  Sharon Swanson, MRN:  [id number], DOB:  October 31, 1960, LOS: 6 ADMISSION DATE:  08/02/2019, CONSULTATION DATE:  08/07/2019 REFERRING MD:  Marva Panda, MD CHIEF COMPLAINT:  SOB   Brief History   58 y/o morbidly obese female with acute hypoxic respiratory failure,  admitted 11/7 accelerated HTN, decompensated OSA/OHS, w/ decompensated cor pulmonale and diastolic dysfxn.   ->had not been on meds for over 2 months ->previously advised to be on CPAP but could not afford co-pay History of present illness   This 58 y/o morbidly obese female with PMH as below presented to the ED 08/02/19 with c/o SOB, DOE, orthopnea and bilat lower leg swelling x 2 weeks.  She also reported fever of 101 and nonproductive cough x 1 day.  Of note, pt is unemployed, thus has lost her medical insurance and reports having stopped taking her medications a few months pta, and she is unable to afford her CPAP machine.  Her BP was 201/108 upon arrival to the ED and she was mildly hypoxic, requiring 2 L O2 to maintain spO2 > 90%.  Since admission, she has been treated with empiric abx to cover for CAP and RLE cellulitis.  CTA neg for PE.  She has been diuresed for CHF exacerbation.   She remained hypoxic w/ upper airway wheezing and therefore pulmonary asked to eval.  ->on time of evaluation: 5 liter Swisher 95% (supine position)  Past Medical History  HTN COPD Type 2 DM, insulin-dependent Asthma Obesity BMI 48.41 OSA  Consults:  11/12 >> PCCM   Significant Diagnostic Tests:  CTA Chest 11/7 >> neg for PE/ PNA  2D Echo 11/7 >> EF 60-65%, moderate LVH, normal LV and RV function LE Venous Duplex 11/7 >> neg for DVT PCXR 11/11 >> Atelectasis vs consolidation vs effusion LLL  Micro Data:  SARS-CoV2 11/7 >> neg Blood cx 11/9 >>     Antimicrobials:  11/7 PO Keflex >> stopped 11/9 11/9 zithromax >>  11/9 rocephin >>   Interim history/subjective:  Able to walk to BR and back to bed off 02 by hold onto sides of bed and  walls   Objective   Blood pressure (!) 149/80, pulse 74, temperature 98.5 F (36.9 C), temperature source Oral, resp. rate 16, height 5\' 7"  (1.702 m), weight (!) 137.8 kg, SpO2 96 %. 5lpm         Intake/Output Summary (Last 24 hours) at 08/08/2019 1127 Last data filed at 08/08/2019 1000 Gross per 24 hour  Intake 220 ml  Output 2850 ml  Net -2630 ml  sats 95% 5 liters  Filed Weights   08/06/19 0459 08/07/19 0500 08/08/19 0432  Weight: (!) 139.4 kg (!) 140.2 kg (!) 137.8 kg    Examination:  Pt alert, approp nad @ 45 degrees hob  No jvd Oropharynx clear,  mucosa nl Neck supple Lungs with prominent upper airway "wheeze"  RRR no s3 or or sign murmur Abd obese with limited  excursion  Extr warm with no edema or clubbing noted - hyperpigmented changes le's c/w venous stasis  Neuro  Sensorium intact,  no apparent motor deficits    Resolved Hospital Problem list   Cellulitis resolved s/p ceftriaxone  Assessment & Plan:   Acute hypoxic respiratory failure superimposed on chronic hypercarbia  in the setting of pulmonary edema due to  decompensated diastolic HF and cor pulmonale secondary to untreated OSA, and accelerated HTN  ->  Spirometry restrictive 11/2026 with very low erv c/w obesity with nl f/v loop  Plan: Continue diuresis as renal function and BP will allow per [facility]   Continue antihypertensives CPAP qhs Goal for 02 is sats > 90% but no need to push higher as she is hypercarbic Will need osa/ohs f/u at d/c Dr Vaughan Browner    Wheezing w/ possible CAP.  - c/w Upper airway cough syndrome (previously labeled PNDS),  is so named because it's frequently impossible to sort out how much is  CR/sinusitis with freq throat clearing (which can be related to primary GERD)   vs  causing  secondary (" extra esophageal")  GERD from wide swings in gastric pressure that occur with throat clearing, often  promoting self use of mint and menthol lozenges that reduce the lower esophageal sphincter  tone and exacerbate the problem further in a cyclical fashion.   These are the same pts (now being labeled as having "irritable larynx syndrome" by some cough centers) who not infrequently have a history of having failed to tolerate ace inhibitors,  dry powder inhalers(especially advair)  or biphosphonates or report having atypical/extraesophageal reflux symptoms that don't respond to standard doses of PPI  and are easily confused as having aecopd or asthma flares by even experienced allergists/ pulmonologists (myself included).  Plan Continue max rx for gerd  No more advair, just use neb prn pending f/u with [facility]       HTN Plan Continue antihypertensives Case management working on plan for medications after discharge  Morbid obesity Plan Etiology and pathophysiology of osa/ohs  including relationship to obesity reviewed in detail    Smoker: I reviewed the Fletcher curve with the patient that basically indicates  if you quit smoking when your best day FEV1 is still well preserved (as is clearly  the case here)  it is highly unlikely you will progress to severe disease and informed the patient there was  no medication on the market that has proven to alter the curve/ its downward trajectory  or the likelihood of progression of their disease(unlike other chronic medical conditions such as atheroclerosis where we do think we can change the natural hx with risk reducing meds)    Therefore stopping smoking and maintaining abstinence are  the most important aspects of care, not choice of inhalers or for that matter, doctors.   Treatment other than smoking cessation  is entirely directed by severity of symptoms and focused also on reducing exacerbations, not attempting to change the natural history of the disease.       Labs   CBC: Recent Labs  Lab 08/02/19 0610 08/02/19 1332 08/05/19 0355 08/07/19 0332 08/08/19 0424  WBC 4.6 4.8 4.8 5.9 5.2  NEUTROABS 3.0  --   --   --   --   HGB  16.3* 16.6* 15.5* 16.6* 16.5*  HCT 53.7* 54.4* 54.2* 56.2* 56.3*  MCV 92.7 91.4 95.8 94.5 94.5  PLT 170 172 177 187 0000000    Basic Metabolic Panel: Recent Labs  Lab 08/04/19 0350 08/05/19 0355 08/06/19 0422 08/07/19 0332 08/08/19 0424  NA 140 138 135 138 140  K 4.5 4.1 4.5 4.2 4.3  CL 96* 94* 92* 94* 92*  CO2 33* 34* 32 33* 37*  GLUCOSE 162* 152* 270* 240* 105*  BUN 19 17 22* 27* 30*  CREATININE 0.81 0.63 0.60 0.64 0.73  CALCIUM 9.5 8.9 9.2 9.1 9.0  MG  --   --   --  2.3  --    GFR: Estimated Creatinine Clearance: 111.4 mL/min (by C-G formula based on SCr  of 0.73 mg/dL). Recent Labs  Lab 08/02/19 1332 08/04/19 0350 08/05/19 0355 08/07/19 0332 08/08/19 0424  PROCALCITON  --  <0.10  --   --   --   WBC 4.8  --  4.8 5.9 5.2    Liver Function Tests: No results for input(s): AST, ALT, ALKPHOS, BILITOT, PROT, ALBUMIN in the last 168 hours. No results for input(s): LIPASE, AMYLASE in the last 168 hours. No results for input(s): AMMONIA in the last 168 hours.  ABG    Component Value Date/Time   TCO2 29 08/04/2016 1728     Coagulation Profile: No results for input(s): INR, PROTIME in the last 168 hours.  Cardiac Enzymes: No results for input(s): CKTOTAL, CKMB, CKMBINDEX, TROPONINI in the last 168 hours.  HbA1C: Hgb A1c MFr Bld  Date/Time Value Ref Range Status  08/02/2019 01:32 PM 8.4 (H) 4.8 - 5.6 % Final    Comment:    (NOTE) Pre diabetes:          5.7%-6.4% Diabetes:              >6.4% Glycemic control for   <7.0% adults with diabetes   01/19/2017 07:50 PM 7.0 (H) 4.8 - 5.6 % Final    Comment:    (NOTE)         Pre-diabetes: 5.7 - 6.4         Diabetes: >6.4         Glycemic control for adults with diabetes: <7.0     CBG: Recent Labs  Lab 08/07/19 0723 08/07/19 1106 08/07/19 1609 08/07/19 2113 08/08/19 0808  GLUCAP 108* 240* 325* 260* 127*

## 2019-08-08 NOTE — Progress Notes (Signed)
Pt c/o pain in groin area going around from front to her back and asked for something stronger after taking tylenol. MD made aware

## 2019-08-08 NOTE — Progress Notes (Signed)
PROGRESS NOTE    Sharon Swanson    Code Status: Full Code  [id number] DOB: [birth year] DOA: 08/02/2019  PCP: Eloise Levels, NP    Hospital Summary  Patient is a 58 year old female with history of hypertension, COPD, diabetes mellitus, IDDM, smoking, just quit presented to ED with 2 weeks of worsening shortness of breath. Per patient, she had stopped taking her medication 2 weeks ago as she lost her insurance. She started noticing shortness of breath, worse with exertion, also has not been able to sleep, has been sitting up on the couch for last 2 weeks. She also noticed swelling in her both lower legs, worsening. Right lower leg was becoming more tight, tender and red. Patient reported that she had a fever of 101 F last night with coughing. She also reports chest tightness with exertion. No nausea or vomiting, stated she had diarrhea last week. No recent long distance travels. Patient has been home due to work injury since April 2020. COVID-19 test negative BP 201/108 at the time of admission.  Since being hospitalized patient has been treated with IV Lasix for diastolic heart failure exacerbation as well as antibiotics for CAP and right lower extremity cellulitis.  Symptoms have improved but not resolved.  Pulmonary was consulted on 11/12  A & P   Principal Problem:   Acute respiratory failure with hypoxia (HCC) Active Problems:   BMI 45.0-49.9, adult (HCC)   COPD (chronic obstructive pulmonary disease) (HCC)   Insulin-requiring or dependent type II diabetes mellitus (HCC)   Essential hypertension   Acute CHF (congestive heart failure) (HCC)   Cellulitis   Acute hypoxic respiratory failure with superimposed chronic hypercarbia in setting of pulmonary edema secondary to Acute Diastolic Heart Failure Exacerbation, cor pulmonale secondary to untreated OSA and accelerated hypertension spirometry consistent with obesity with normal flow volume loop.  Improving. -Per pulmonary:  Continue with diuresis, continue antihypertensives, CPAP nightly, goal SPO2 greater than 90% but no need to go higher if she is hypercarbic.  Will need OSA/OHS follow-up at discharge with Dr. Vaughan Browner  Wheezing with possible community-acquired pneumonia versus upper airway cough syndrome afebrile, CBC not drawn today, procalcitonin negative, may have been viral in etiology. Had a fever on presentaiton.  Day 5/5 azithromycin/ceftriaxone procalcitonin negative, afebrile and without leukocytosis.  More likely upper airway cough syndrome per pulmonary. -Discontinue antibiotics -Max therapy for GERD -Discontinue Advair, chest use neb as needed pending follow-up with Dr. Vaughan Browner  COPD exacerbation secondary to above -Plan as above  Cellulitis resolved with ceftriaxone  Accelerated hypertension with acute heart failure was not taking any of her antihypertensives at home due to lack of insurance.  Echo with LVH. Appears euvolemic on exam -Continue increased losartan 50 mg daily -Continue Coreg 12.5 mg twice daily -Social work consult  Morbid obesity -Counseled on diet and weight control  [facility] cannot afford her home CPAP machine.   -CPAP nightly while inpatient -Social work consulted.  We will try to get patient CPAP at discharge  Steroid induced hyperglycemia in setting of poorly controlled diabetes HA1C 8.4.  -Diabetic coordinator consulted: Change insulin to Insulin 70/30 20 u BID and continue night sliding scale -Plan to discharge on insulin 70/30 20 u BID and metformin BID due to lack of insurance   Tobacco use quit smoking in prior to admission  -Chantix   DVT prophylaxis: Lovenox Diet: Heart healthy carb modified Family Communication: Discussed with husband at bedside Disposition Plan: Pending clinical stability, might be able to discharge in a.m. with  home O2   Subjective   Seen and examined resting comfortably in bed.  States she feels improved today and was able to ambulate a  bit.  Denies any chest pain.  Admits to persistent but improving shortness of breath and wheezing.  Otherwise no complaints and remaining ROS negative. Objective   Vitals:   08/07/19 2117 08/08/19 0432 08/08/19 1035 08/08/19 1500  BP: (!) 146/69 (!) 149/80  124/60  Pulse: 80 74  82  Resp: 18 16  18   Temp: 98.6 F (37 C) 98.5 F (36.9 C)  99 F (37.2 C)  TempSrc: Oral Oral  Oral  SpO2: 94% 99% 96% 94%  Weight:  (!) 137.8 kg    Height:        Intake/Output Summary (Last 24 hours) at 08/08/2019 1820 Last data filed at 08/08/2019 1700 Gross per 24 hour  Intake 240 ml  Output 2375 ml  Net -2135 ml   Filed Weights   08/06/19 0459 08/07/19 0500 08/08/19 0432  Weight: (!) 139.4 kg (!) 140.2 kg (!) 137.8 kg    Examination:  Physical Exam Vitals signs and nursing note reviewed.  Constitutional:      Appearance: Normal appearance. She is obese.  HENT:     Head: Normocephalic and atraumatic.     Nose: Nose normal.     Mouth/Throat:     Mouth: Mucous membranes are moist.  Eyes:     Extraocular Movements: Extraocular movements intact.  Neck:     Musculoskeletal: Normal range of motion. No neck rigidity.  Cardiovascular:     Rate and Rhythm: Normal rate and regular rhythm.  Pulmonary:     Effort: Pulmonary effort is normal.     Breath sounds: Wheezing present.  Abdominal:     General: Abdomen is flat.     Palpations: Abdomen is soft.  Musculoskeletal: Normal range of motion.        General: No swelling.     Right lower leg: No edema.     Left lower leg: No edema.  Neurological:     General: No focal deficit present.     Mental Status: She is alert. Mental status is at baseline.  Psychiatric:        Mood and Affect: Mood normal.        Behavior: Behavior normal.     Data Reviewed: I have personally reviewed following labs and imaging studies  CBC: Recent Labs  Lab 08/02/19 0610 08/02/19 1332 08/05/19 0355 08/07/19 0332 08/08/19 0424  WBC 4.6 4.8 4.8 5.9 5.2   NEUTROABS 3.0  --   --   --   --   HGB 16.3* 16.6* 15.5* 16.6* 16.5*  HCT 53.7* 54.4* 54.2* 56.2* 56.3*  MCV 92.7 91.4 95.8 94.5 94.5  PLT 170 172 177 187 0000000   Basic Metabolic Panel: Recent Labs  Lab 08/04/19 0350 08/05/19 0355 08/06/19 0422 08/07/19 0332 08/08/19 0424  NA 140 138 135 138 140  K 4.5 4.1 4.5 4.2 4.3  CL 96* 94* 92* 94* 92*  CO2 33* 34* 32 33* 37*  GLUCOSE 162* 152* 270* 240* 105*  BUN 19 17 22* 27* 30*  CREATININE 0.81 0.63 0.60 0.64 0.73  CALCIUM 9.5 8.9 9.2 9.1 9.0  MG  --   --   --  2.3  --    GFR: Estimated Creatinine Clearance: 111.4 mL/min (by C-G formula based on SCr of 0.73 mg/dL). Liver Function Tests: No results for input(s): AST, ALT, ALKPHOS, BILITOT, PROT,  ALBUMIN in the last 168 hours. No results for input(s): LIPASE, AMYLASE in the last 168 hours. No results for input(s): AMMONIA in the last 168 hours. Coagulation Profile: No results for input(s): INR, PROTIME in the last 168 hours. Cardiac Enzymes: No results for input(s): CKTOTAL, CKMB, CKMBINDEX, TROPONINI in the last 168 hours. BNP (last 3 results) No results for input(s): PROBNP in the last 8760 hours. HbA1C: No results for input(s): HGBA1C in the last 72 hours. CBG: Recent Labs  Lab 08/07/19 1609 08/07/19 2113 08/08/19 0808 08/08/19 1159 08/08/19 1655  GLUCAP 325* 260* 127* 131* 196*   Lipid Profile: No results for input(s): CHOL, HDL, LDLCALC, TRIG, CHOLHDL, LDLDIRECT in the last 72 hours. Thyroid Function Tests: No results for input(s): TSH, T4TOTAL, FREET4, T3FREE, THYROIDAB in the last 72 hours. Anemia Panel: No results for input(s): VITAMINB12, FOLATE, FERRITIN, TIBC, IRON, RETICCTPCT in the last 72 hours. Sepsis Labs: Recent Labs  Lab 08/04/19 0350  PROCALCITON <0.10    Recent Results (from the past 240 hour(s))  SARS CORONAVIRUS 2 (TAT 6-24 HRS) Nasopharyngeal Nasopharyngeal Swab     Status: None   Collection Time: 08/02/19  8:05 AM   Specimen:  Nasopharyngeal Swab  Result Value Ref Range Status   SARS Coronavirus 2 NEGATIVE NEGATIVE Final    Comment: (NOTE) SARS-CoV-2 target nucleic acids are NOT DETECTED. The SARS-CoV-2 RNA is generally detectable in upper and lower respiratory specimens during the acute phase of infection. Negative results do not preclude SARS-CoV-2 infection, do not rule out co-infections with other pathogens, and should not be used as the sole basis for treatment or other patient management decisions. Negative results must be combined with clinical observations, patient history, and epidemiological information. The expected result is Negative. Fact Sheet for Patients: SugarRoll.be Fact Sheet for Healthcare Providers: https://www.woods-[place].com/ This test is not yet approved or cleared by the [place] FDA and  has been authorized for detection and/or diagnosis of SARS-CoV-2 by FDA under an Emergency Use Authorization (EUA). This EUA will remain  in effect (meaning this test can be used) for the duration of the COVID-19 declaration under Section 56 4(b)(1) of the Act, 21 U.S.C. section 360bbb-3(b)(1), unless the authorization is terminated or revoked sooner. Performed at [facility], [place] [address], [place], [place] [address]   Culture, blood (routine x 2)     Status: None (Preliminary result)   Collection Time: 08/04/19  7:34 AM   Specimen: BLOOD  Result Value Ref Range Status   Specimen Description   Final    BLOOD LEFT ARM Performed at [facility] [address] [place], [place] [address]    Special Requests   Final    BOTTLES DRAWN AEROBIC AND ANAEROBIC Blood Culture results may not be optimal due to an inadequate volume of blood received in culture bottles Performed at [facility] [address] [place], [place] [address]    Culture   Final    NO GROWTH 4 DAYS Performed at [facility], [place] [address] [place], [place] [address]    Report Status PENDING  Incomplete  Culture, blood (routine x 2)     Status: None (Preliminary result)   Collection Time: 08/04/19  7:34 AM   Specimen: BLOOD LEFT HAND  Result Value Ref Range Status   Specimen Description   Final    BLOOD LEFT HAND Performed at [facility] [address], [place], [place] [address]    Special  Requests   Final    BOTTLES DRAWN AEROBIC ONLY Blood Culture results may not be optimal due to an inadequate volume of blood received in culture bottles Performed at [facility] [address], [place], [place] [address]    Culture   Final    NO GROWTH 4 DAYS Performed at [facility], [place] [address] [place], [place] [address]    Report Status PENDING  Incomplete         Radiology Studies: No results found.      Scheduled Meds:  azithromycin  500 mg Oral Daily   benzonatate  100 mg Oral TID   carvedilol  12.5 mg Oral BID WC   enoxaparin (LOVENOX) injection  60 mg Subcutaneous Q24H   fluticasone  1 spray Each Nare Daily   furosemide  40 mg Intravenous Q12H   gabapentin  300 mg Oral TID   insulin aspart  0-5 Units Subcutaneous QHS   insulin aspart protamine- aspart  20 Units Subcutaneous BID WC   ipratropium-albuterol  3 mL Nebulization BID   losartan  50 mg Oral Daily   nystatin  5 mL Oral QID   pantoprazole  40 mg Oral BID AC   sodium chloride flush  3 mL Intravenous Q12H   varenicline  0.5 mg Oral Daily   Continuous Infusions:  sodium chloride     cefTRIAXone (ROCEPHIN)  IV 2 g (08/08/19 1649)     LOS: 6 days    Time spent: 25 minutes with over 50% of the time coordinating the patient's care    Harold Hedge, DO [facility] Pager [phone]  If 7PM-7AM, please contact night-coverage [email or web address] Password [email or web address] 08/08/2019, 6:20 PM

## 2019-08-08 NOTE — Progress Notes (Signed)
Pt tolerating well CPAP during this tour,

## 2019-08-08 NOTE — Progress Notes (Signed)
Pt. States that she does not want to wear CPAP tonight because of frequency of bathroom visits.  She states that she will wear tomorrow.

## 2019-08-09 LAB — CULTURE, BLOOD (ROUTINE X 2)
Culture: NO GROWTH
Culture: NO GROWTH

## 2019-08-09 LAB — CREATININE, SERUM
Creatinine, Ser: 0.77 mg/dL (ref 0.44–1.00)
GFR calc Af Amer: >60 mL/min (ref >60)
GFR calc non Af Amer: >60 mL/min (ref >60)

## 2019-08-09 LAB — GLUCOSE, CAPILLARY
Glucose-Capillary: 127 mg/dL — ABNORMAL HIGH (ref 70–99)
Glucose-Capillary: 211 mg/dL — ABNORMAL HIGH (ref 70–99)
Glucose-Capillary: 169 mg/dL — ABNORMAL HIGH (ref 70–99)
Glucose-Capillary: 212 mg/dL — ABNORMAL HIGH (ref 70–99)

## 2019-08-09 NOTE — Progress Notes (Signed)
Pt. Prefers not to wear CPAP again this shift.  States she did fine last night without out.  Will be available if she changes her mind.

## 2019-08-09 NOTE — Progress Notes (Signed)
NAME:  Sharon Swanson, MRN:  [id number], DOB:  April 06, 1961, LOS: 6 ADMISSION DATE:  08/02/2019, CONSULTATION DATE:  08/07/2019 REFERRING MD:  Marva Panda, MD CHIEF COMPLAINT:  SOB   Brief History   58 y/o morbidly obese female with acute hypoxic respiratory failure,  admitted 11/7 accelerated HTN, decompensated OSA/OHS, w/ decompensated cor pulmonale and diastolic dysfxn.   ->had not been on meds for over 2 months ->previously advised to be on CPAP but could not afford co-pay History of present illness   This 58 y/o morbidly obese female with PMH as below presented to the ED 08/02/19 with c/o SOB, DOE, orthopnea and bilat lower leg swelling x 2 weeks.  She also reported fever of 101 and nonproductive cough x 1 day.  Of note, pt is unemployed, thus has lost her medical insurance and reports having stopped taking her medications a few months pta, and she is unable to afford her CPAP machine.  Her BP was 201/108 upon arrival to the ED and she was mildly hypoxic, requiring 2 L O2 to maintain spO2 > 90%.  Since admission, she has been treated with empiric abx to cover for CAP and RLE cellulitis.  CTA neg for PE.  She has been diuresed for CHF exacerbation.   She remained hypoxic w/ upper airway wheezing and therefore pulmonary asked to eval.  ->on time of evaluation: 5 liter [place] 95% (supine position)  Past Medical History  HTN COPD Type 2 DM, insulin-dependent Asthma Obesity BMI 48.41 OSA  Consults:  11/12 >> PCCM   Significant Diagnostic Tests:  CTA Chest 11/7 >> neg for PE/ PNA  2D Echo 11/7 >> EF 60-65%, moderate LVH, normal LV and RV function LE Venous Duplex 11/7 >> neg for DVT PCXR 11/11 >> Atelectasis vs consolidation vs effusion LLL  Micro Data:  SARS-CoV2 11/7 >> neg Blood cx 11/9 >>     Antimicrobials:  11/7 PO Keflex >> stopped 11/9 11/9 zithromax >>  11/9 rocephin >>   Interim history/subjective:  Sitting in chair, all smiles today   Objective      Examination: Pt  alert, approp nad @ sitting in chair  No jvd Oropharynx clear,  mucosa nl Neck supple/ no more pseudowheeze  Lungs with a few scattered exp > insp rhonchi bilaterally RRR no s3 or or sign murmur Abd obese with limited  excursion  Extr warm with no edema or clubbing noted Neuro  Sensorium nl ,  no apparent motor deficits    Resolved Hospital Problem list   Cellulitis resolved s/p ceftriaxone  Assessment & Plan:   Acute hypoxic respiratory failure superimposed on chronic hypercarbia  in the setting of pulmonary edema due to  decompensated diastolic HF and cor pulmonale secondary to untreated OSA, and accelerated HTN  ->  Spirometry restrictive 11/2026 with very low erv c/w obesity with nl f/v loop   Plan:  Continue antihypertensives CPAP qhs Goal for 02 is sats > 90% but no need to push higher as she is hypercarbic Will need osa/ohs f/u at d/c Dr Vaughan Browner    Wheezing w/ possible CAP.  - improved 08/09/2019     Plan Continue max rx for gerd  Continue off  advair, just use neb prn pending f/u with [facility]       HTN Plan Continue antihypertensives Case management working on plan for medications after discharge  Morbid obesity Plan Etiology and pathophysiology of osa/ohs  including relationship to obesity reviewed in detail 11/13   Smoker:  challenge  will be maintaining off at d/c        Labs   CBC: Recent Labs  Lab 08/02/19 0610 08/02/19 1332 08/05/19 0355 08/07/19 0332 08/08/19 0424  WBC 4.6 4.8 4.8 5.9 5.2  NEUTROABS 3.0  --   --   --   --   HGB 16.3* 16.6* 15.5* 16.6* 16.5*  HCT 53.7* 54.4* 54.2* 56.2* 56.3*  MCV 92.7 91.4 95.8 94.5 94.5  PLT 170 172 177 187 0000000    Basic Metabolic Panel: Recent Labs  Lab 08/04/19 0350 08/05/19 0355 08/06/19 0422 08/07/19 0332 08/08/19 0424  NA 140 138 135 138 140  K 4.5 4.1 4.5 4.2 4.3  CL 96* 94* 92* 94* 92*  CO2 33* 34* 32 33* 37*  GLUCOSE 162* 152* 270* 240* 105*  BUN 19 17 22* 27* 30*  CREATININE 0.81 0.63  0.60 0.64 0.73  CALCIUM 9.5 8.9 9.2 9.1 9.0  MG  --   --   --  2.3  --    GFR: Estimated Creatinine Clearance: 111.4 mL/min (by C-G formula based on SCr of 0.73 mg/dL). Recent Labs  Lab 08/02/19 1332 08/04/19 0350 08/05/19 0355 08/07/19 0332 08/08/19 0424  PROCALCITON  --  <0.10  --   --   --   WBC 4.8  --  4.8 5.9 5.2    Liver Function Tests: No results for input(s): AST, ALT, ALKPHOS, BILITOT, PROT, ALBUMIN in the last 168 hours. No results for input(s): LIPASE, AMYLASE in the last 168 hours. No results for input(s): AMMONIA in the last 168 hours.  ABG    Component Value Date/Time   TCO2 29 08/04/2016 1728     Coagulation Profile: No results for input(s): INR, PROTIME in the last 168 hours.  Cardiac Enzymes: No results for input(s): CKTOTAL, CKMB, CKMBINDEX, TROPONINI in the last 168 hours.  HbA1C: Hgb A1c MFr Bld  Date/Time Value Ref Range Status  08/02/2019 01:32 PM 8.4 (H) 4.8 - 5.6 % Final    Comment:    (NOTE) Pre diabetes:          5.7%-6.4% Diabetes:              >6.4% Glycemic control for   <7.0% adults with diabetes   01/19/2017 07:50 PM 7.0 (H) 4.8 - 5.6 % Final    Comment:    (NOTE)         Pre-diabetes: 5.7 - 6.4         Diabetes: >6.4         Glycemic control for adults with diabetes: <7.0     CBG: Recent Labs  Lab 08/07/19 0723 08/07/19 1106 08/07/19 1609 08/07/19 2113 08/08/19 0808  GLUCAP 108* 240* 325* 260* 127*    Christinia Gully, MD [facility] [phone] After 5:30 PM or weekends, use [facility] [phone]

## 2019-08-09 NOTE — Progress Notes (Signed)
PROGRESS NOTE    Sharon Swanson    Code Status: Full Code  [id number] DOB: [birth year] DOA: 08/02/2019  PCP: Eloise Levels, NP    Hospital Summary  Patient is a 58 year old female with history of hypertension, COPD, diabetes mellitus, IDDM, smoking, just quit presented to ED with 2 weeks of worsening shortness of breath. Per patient, she had stopped taking her medication 2 weeks ago as she lost her insurance. She started noticing shortness of breath, worse with exertion, also has not been able to sleep, has been sitting up on the couch for last 2 weeks. She also noticed swelling in her both lower legs, worsening. Right lower leg was becoming more tight, tender and red. Patient reported that she had a fever of 101 F last night with coughing. She also reports chest tightness with exertion. No nausea or vomiting, stated she had diarrhea last week. No recent long distance travels. Patient has been home due to work injury since April 2020. COVID-19 test negative BP 201/108 at the time of admission.  Since being hospitalized patient has been treated with IV Lasix for diastolic heart failure exacerbation as well as antibiotics for CAP and right lower extremity cellulitis.  Symptoms have improved but not resolved.  Pulmonary was consulted on 11/12  A & P   Principal Problem:   Acute respiratory failure with hypoxia (HCC) Active Problems:   BMI 45.0-49.9, adult (HCC)   COPD (chronic obstructive pulmonary disease) (HCC)   Insulin-requiring or dependent type II diabetes mellitus (HCC)   Essential hypertension   Acute CHF (congestive heart failure) (HCC)   Cellulitis   Acute hypoxic respiratory failure with superimposed chronic hypercarbia in setting of pulmonary edema secondary to Acute Diastolic Heart Failure Exacerbation, cor pulmonale secondary to untreated OSA and accelerated hypertension spirometry consistent with obesity with normal flow volume loop.  Continues to improve. -Per  pulmonary: Continue with diuresis, continue antihypertensives, CPAP nightly, goal SPO2 greater than 90% but no need to go higher if she is hypercarbic.  Will need OSA/OHS follow-up at discharge with Dr. Vaughan Browner -Advised patient to use bedside Aerobika to help with cough/secretions  Wheezing with possible community-acquired pneumonia versus upper airway cough syndrome afebrile, CBC not drawn today, procalcitonin negative, may have been viral in etiology. Had a fever on presentaiton.  Completed 5 days azithromycin/ceftriaxone procalcitonin negative, afebrile and without leukocytosis.  More likely upper airway cough syndrome per pulmonary. -Max therapy for GERD -Discontinue Advair, chest use neb as needed pending follow-up with Dr. Vaughan Browner  COPD exacerbation secondary to above -Plan as above  Oral thrush - nystatin oral solution  Cellulitis resolved with ceftriaxone  Accelerated hypertension with acute heart failure was not taking any of her antihypertensives at home due to lack of insurance.  Echo with LVH. Appears euvolemic on exam. Now stable -Continue increased losartan 50 mg daily -Continue Coreg 12.5 mg twice daily -Social work consult  Morbid obesity -Counseled on diet and weight control  [facility] cannot afford her home CPAP machine.   -CPAP nightly while inpatient -Social work consulted.  We will try to get patient CPAP at discharge  Steroid induced hyperglycemia in setting of poorly controlled diabetes HA1C 8.4.  -Diabetic coordinator consulted: Change insulin to Insulin 70/30 20 u BID and continue night sliding scale -Plan to discharge on insulin 70/30 20 u BID and metformin BID due to lack of insurance   Tobacco use quit smoking in prior to admission  -Chantix   DVT prophylaxis: Lovenox Diet: Heart healthy carb modified  Family Communication: Discussed with husband at bedside yesterday. No family at bedside today Disposition Plan: Pending clinical stability, Likely DC Monday.  Will need Rest and Ambulatory O2 requirements checked prior to DC.    Subjective   Patient seen and examined at bedside no acute distress and resting comfortably. Refused CPAP last night.  Tolerating diet.  Admits to improving shortness of breath. Denies any chest pain, fever, nausea, vomiting, Otherwise 10 point ROS negative   Objective   Vitals:   08/09/19 0626 08/09/19 0817 08/09/19 1017 08/09/19 1516  BP: (!) 145/61   139/68  Pulse: 75  83 89  Resp: 18   (!) 21  Temp: 98.2 F (36.8 C)   98.6 F (37 C)  TempSrc: Oral   Oral  SpO2: 95% 96%  98%  Weight: (!) 138.1 kg     Height:        Intake/Output Summary (Last 24 hours) at 08/09/2019 1625 Last data filed at 08/09/2019 1357 Gross per 24 hour  Intake 480 ml  Output 2375 ml  Net -1895 ml   Filed Weights   08/07/19 0500 08/08/19 0432 08/09/19 0626  Weight: (!) 140.2 kg (!) 137.8 kg (!) 138.1 kg    Examination:  Physical Exam Vitals signs and nursing note reviewed.  Constitutional:      Appearance: Normal appearance.  HENT:     Head: Normocephalic and atraumatic.     Nose: Nose normal.     Mouth/Throat:     Comments: White patches noted Eyes:     Extraocular Movements: Extraocular movements intact.  Neck:     Musculoskeletal: Normal range of motion. No neck rigidity.  Cardiovascular:     Rate and Rhythm: Normal rate and regular rhythm.  Pulmonary:     Comments: Productive cough with [place] use Improved wheeze Abdominal:     General: Abdomen is flat. Bowel sounds are normal.     Palpations: Abdomen is soft.  Musculoskeletal: Normal range of motion.        General: No swelling.  Neurological:     Mental Status: She is alert. Mental status is at baseline.  Psychiatric:        Mood and Affect: Mood normal.        Behavior: Behavior normal.     Data Reviewed: I have personally reviewed following labs and imaging studies  CBC: Recent Labs  Lab 08/05/19 0355 08/07/19 0332 08/08/19 0424  WBC 4.8  5.9 5.2  HGB 15.5* 16.6* 16.5*  HCT 54.2* 56.2* 56.3*  MCV 95.8 94.5 94.5  PLT 177 187 0000000   Basic Metabolic Panel: Recent Labs  Lab 08/04/19 0350 08/05/19 0355 08/06/19 0422 08/07/19 0332 08/08/19 0424 08/09/19 0504  NA 140 138 135 138 140  --   K 4.5 4.1 4.5 4.2 4.3  --   CL 96* 94* 92* 94* 92*  --   CO2 33* 34* 32 33* 37*  --   GLUCOSE 162* 152* 270* 240* 105*  --   BUN 19 17 22* 27* 30*  --   CREATININE 0.81 0.63 0.60 0.64 0.73 0.77  CALCIUM 9.5 8.9 9.2 9.1 9.0  --   MG  --   --   --  2.3  --   --    GFR: Estimated Creatinine Clearance: 111.6 mL/min (by C-G formula based on SCr of 0.77 mg/dL). Liver Function Tests: No results for input(s): AST, ALT, ALKPHOS, BILITOT, PROT, ALBUMIN in the last 168 hours. No results for input(s): LIPASE, AMYLASE  in the last 168 hours. No results for input(s): AMMONIA in the last 168 hours. Coagulation Profile: No results for input(s): INR, PROTIME in the last 168 hours. Cardiac Enzymes: No results for input(s): CKTOTAL, CKMB, CKMBINDEX, TROPONINI in the last 168 hours. BNP (last 3 results) No results for input(s): PROBNP in the last 8760 hours. HbA1C: No results for input(s): HGBA1C in the last 72 hours. CBG: Recent Labs  Lab 08/08/19 1159 08/08/19 1655 08/08/19 2123 08/09/19 0811 08/09/19 1225  GLUCAP 131* 196* 188* 127* 211*   Lipid Profile: No results for input(s): CHOL, HDL, LDLCALC, TRIG, CHOLHDL, LDLDIRECT in the last 72 hours. Thyroid Function Tests: No results for input(s): TSH, T4TOTAL, FREET4, T3FREE, THYROIDAB in the last 72 hours. Anemia Panel: No results for input(s): VITAMINB12, FOLATE, FERRITIN, TIBC, IRON, RETICCTPCT in the last 72 hours. Sepsis Labs: Recent Labs  Lab 08/04/19 0350  PROCALCITON <0.10    Recent Results (from the past 240 hour(s))  SARS CORONAVIRUS 2 (TAT 6-24 HRS) Nasopharyngeal Nasopharyngeal Swab     Status: None   Collection Time: 08/02/19  8:05 AM   Specimen: Nasopharyngeal Swab    Result Value Ref Range Status   SARS Coronavirus 2 NEGATIVE NEGATIVE Final    Comment: (NOTE) SARS-CoV-2 target nucleic acids are NOT DETECTED. The SARS-CoV-2 RNA is generally detectable in upper and lower respiratory specimens during the acute phase of infection. Negative results do not preclude SARS-CoV-2 infection, do not rule out co-infections with other pathogens, and should not be used as the sole basis for treatment or other patient management decisions. Negative results must be combined with clinical observations, patient history, and epidemiological information. The expected result is Negative. Fact Sheet for Patients: SugarRoll.be Fact Sheet for Healthcare Providers: https://www.woods-Fossum.com/ This test is not yet approved or cleared by the [place] FDA and  has been authorized for detection and/or diagnosis of SARS-CoV-2 by FDA under an Emergency Use Authorization (EUA). This EUA will remain  in effect (meaning this test can be used) for the duration of the COVID-19 declaration under Section 56 4(b)(1) of the Act, 21 U.S.C. section 360bbb-3(b)(1), unless the authorization is terminated or revoked sooner. Performed at [facility], [place] [address] [place], [place] [address]   Culture, blood (routine x 2)     Status: None   Collection Time: 08/04/19  7:34 AM   Specimen: BLOOD  Result Value Ref Range Status   Specimen Description   Final    BLOOD LEFT ARM Performed at [facility] [address] [place], [place] [address]    Special Requests   Final    BOTTLES DRAWN AEROBIC AND ANAEROBIC Blood Culture results may not be optimal due to an inadequate volume of blood received in culture bottles Performed at [facility] [address], [place], [place] [address]    Culture   Final    NO GROWTH 5 DAYS Performed at [facility], [place] [address], [place], [place]  [address]    Report Status 08/09/2019 FINAL  Final  Culture, blood (routine x 2)     Status: None   Collection Time: 08/04/19  7:34 AM   Specimen: BLOOD LEFT HAND  Result Value Ref Range Status   Specimen Description   Final    BLOOD LEFT HAND Performed at [facility] [address] [place], [place] [address]    Special Requests   Final    BOTTLES DRAWN AEROBIC ONLY Blood Culture results  may not be optimal due to an inadequate volume of blood received in culture bottles Performed at [facility] [address], [place], [place] [address]    Culture   Final    NO GROWTH 5 DAYS Performed at [facility], [place] [address], [place], [place] [address]    Report Status 08/09/2019 FINAL  Final         Radiology Studies: No results found.      Scheduled Meds:  azithromycin  500 mg Oral Daily   benzonatate  100 mg Oral TID   carvedilol  12.5 mg Oral BID WC   enoxaparin (LOVENOX) injection  60 mg Subcutaneous Q24H   fluticasone  1 spray Each Nare Daily   furosemide  40 mg Intravenous Q12H   gabapentin  300 mg Oral TID   insulin aspart  0-5 Units Subcutaneous QHS   insulin aspart protamine- aspart  20 Units Subcutaneous BID WC   ipratropium-albuterol  3 mL Nebulization BID   losartan  50 mg Oral Daily   nystatin  5 mL Oral QID   pantoprazole  40 mg Oral BID AC   sodium chloride flush  3 mL Intravenous Q12H   varenicline  0.5 mg Oral Daily   Continuous Infusions:  sodium chloride     cefTRIAXone (ROCEPHIN)  IV 2 g (08/08/19 1649)     LOS: 7 days    Time spent: 20 minutes with over 50% of the time coordinating the patient's care    Harold Hedge, DO [facility] Pager [phone]  If 7PM-7AM, please contact night-coverage [email or web address] Password [email or web address] 08/09/2019, 4:25 PM

## 2019-08-10 ENCOUNTER — Inpatient Hospital Stay (HOSPITAL_COMMUNITY): Payer: Self-pay

## 2019-08-10 LAB — CBC
HCT: 51.9 % — ABNORMAL HIGH (ref 36.0–46.0)
Hemoglobin: 15.4 g/dL — ABNORMAL HIGH (ref 12.0–15.0)
MCH: 28.3 pg (ref 26.0–34.0)
MCHC: 29.7 g/dL — ABNORMAL LOW (ref 30.0–36.0)
MCV: 95.2 fL (ref 80.0–100.0)
Platelets: 173 10*3/uL (ref 150–400)
RBC: 5.45 MIL/uL — ABNORMAL HIGH (ref 3.87–5.11)
RDW: 14.6 % (ref 11.5–15.5)
WBC: 5.3 10*3/uL (ref 4.0–10.5)
nRBC: 0 % (ref 0.0–0.2)

## 2019-08-10 LAB — GLUCOSE, CAPILLARY
Glucose-Capillary: 169 mg/dL — ABNORMAL HIGH (ref 70–99)
Glucose-Capillary: 156 mg/dL — ABNORMAL HIGH (ref 70–99)
Glucose-Capillary: 183 mg/dL — ABNORMAL HIGH (ref 70–99)
Glucose-Capillary: 190 mg/dL — ABNORMAL HIGH (ref 70–99)

## 2019-08-10 LAB — BASIC METABOLIC PANEL
Anion gap: 10 (ref 5–15)
BUN: 31 mg/dL — ABNORMAL HIGH (ref 6–20)
CO2: 34 mmol/L — ABNORMAL HIGH (ref 22–32)
Calcium: 9 mg/dL (ref 8.9–10.3)
Chloride: 91 mmol/L — ABNORMAL LOW (ref 98–111)
Creatinine, Ser: 0.78 mg/dL (ref 0.44–1.00)
GFR calc Af Amer: >60 mL/min (ref >60)
GFR calc non Af Amer: >60 mL/min (ref >60)
Glucose, Bld: 216 mg/dL — ABNORMAL HIGH (ref 70–99)
Potassium: 4.3 mmol/L (ref 3.5–5.1)
Sodium: 135 mmol/L (ref 135–145)

## 2019-08-10 LAB — C-REACTIVE PROTEIN: CRP: 1.6 mg/dL — ABNORMAL HIGH (ref <1.0)

## 2019-08-10 LAB — SEDIMENTATION RATE: Sed Rate: 0 mm/hr (ref 0–22)

## 2019-08-10 LAB — D-DIMER, QUANTITATIVE: D-Dimer, Quant: 0.33 ug/mL-FEU (ref 0.00–0.50)

## 2019-08-10 LAB — MAGNESIUM: Magnesium: 2.1 mg/dL (ref 1.7–2.4)

## 2019-08-10 MED ORDER — FUROSEMIDE 10 MG/ML IJ SOLN
60.0000 mg | Freq: Two times a day (BID) | INTRAMUSCULAR | Status: DC
  Administered 2019-08-10 – 2019-08-12 (×4): 60 mg via INTRAVENOUS
  Filled 2019-08-10 (×4): qty 6

## 2019-08-10 MED ORDER — IPRATROPIUM-ALBUTEROL 0.5-2.5 (3) MG/3ML IN SOLN: 3.0000 mL | Freq: Four times a day (QID) | RESPIRATORY_TRACT | Status: DC | PRN

## 2019-08-10 MED ORDER — DICLOFENAC SODIUM 1 % EX GEL
2.0000 g | Freq: Four times a day (QID) | CUTANEOUS | Status: DC
  Administered 2019-08-10 (×3): 2 g via TOPICAL
  Filled 2019-08-10: qty 100

## 2019-08-10 NOTE — Progress Notes (Signed)
This writer woke patient up to see if she wanted to wear CPAP, patient shook her head no.  Will be available if she changes her mind.

## 2019-08-10 NOTE — Plan of Care (Signed)
Patient continues to diurese, sat up in chair for part of shift.  Ambulatory to bathroom.  Maintains oxygen saturation in the 90's on 5 liters.  Husband at bedside.

## 2019-08-10 NOTE — Progress Notes (Signed)
Called back to patients room because patient now wants to wear CPAP.  Patient claims that she doesn't remember telling me no earlier.  Placed pt. On cpap at this time

## 2019-08-10 NOTE — Progress Notes (Signed)
Pt presented to hospital with acute respiratory failure with hypoxia. Pt has history of COPD, CHF, cellulitis and OSA. Chest x-ray shows retro density that may be atelectasis/consoilidation and mild cardiomegaly. Pt x-rays have been consistently the same. RT assessment shows pt has no increased work of breathing and pt states that she does not feel short of breath. Breath sounds are diminished, pt is on 5L [place] and vitals are normal. Current therapy includes: duo nebs BID, CPT Q4 while awake, flutter valve as needed, CPAP QHS (pt refused the past two nights) and oxygen. Home regimen shows pt is taking albuterol nebs Q6 as needed, albuterol inhaler Q6 as needed and Advair BID. Plan for pt includes Duo Q6 PRN, flutter valve every hour while awake, oxygen and CPAP QHS. RT changed therapy due to x-ray and bilateral breath sounds. RT placed pt on home regimen. There is no indication at this time for CPT Q4 while wake. RT will continue to monitor.

## 2019-08-10 NOTE — Progress Notes (Signed)
PROGRESS NOTE    Sharon Swanson    Code Status: Full Code  XIP:[id number] DOB: 11/10/1960 DOA: 08/02/2019  PCP: Eloise Levels, NP    Hospital Summary  Patient is a 58 year old female with history of hypertension, COPD, diabetes mellitus, IDDM, smoking, just quit presented to ED with 2 weeks of worsening shortness of breath. Per patient, she had stopped taking her medication 2 weeks ago as she lost her insurance. She started noticing shortness of breath, worse with exertion, also has not been able to sleep, has been sitting up on the couch for last 2 weeks. She also noticed swelling in her both lower legs, worsening. Right lower leg was becoming more tight, tender and red. Patient reported that she had a fever of 101 F last night with coughing. She also reports chest tightness with exertion. No nausea or vomiting, stated she had diarrhea last week. No recent long distance travels. Patient has been home due to work injury since April 2020. COVID-19 test negative BP 201/108 at the time of admission.  Since being hospitalized patient has been treated with IV Lasix for diastolic heart failure exacerbation as well as antibiotics for CAP and right lower extremity cellulitis.  Symptoms have improved but not resolved.  Pulmonary was consulted on 11/12  A & P   Principal Problem:   Acute respiratory failure with hypoxia (HCC) Active Problems:   BMI 45.0-49.9, adult (HCC)   COPD (chronic obstructive pulmonary disease) (HCC)   Insulin-requiring or dependent type II diabetes mellitus (HCC)   Essential hypertension   Acute CHF (congestive heart failure) (HCC)   Cellulitis   Acute hypoxic respiratory failure with superimposed chronic hypercarbia in setting of pulmonary edema secondary to Acute Diastolic Heart Failure Exacerbation, cor pulmonale secondary to untreated OSA and accelerated hypertension  Refusing CPAP overnight. Not tolerating O2 wean, requiring 5L [place], with clear sputum  production. -Per pulmonary: Follow up with pulm -Will need OSA/OHS follow-up at discharge with Dr. Vaughan Browner -Advised patient to use bedside Aerobika to help with cough/secretions -Strongly advised to use CPAP at night, patient expressed understanding -Increase lasix to 60 mg IV bid   Wheezing with possible CAP versus upper airway cough syndrome Completed 5 days azithromycin/ceftriaxone. More likely upper airway cough syndrome per pulmonary. -Max therapy for GERD -Discontinue Advair, use neb as needed pending follow-up with Dr. Vaughan Browner -chest PT  COPD exacerbation secondary to above -as above  Oral thrush - nystatin oral solution  Cellulitis of RLE resolved with ceftriaxone  Bilateral leg pain, R>L skin around shins burning sensation to touch, without erythema or warmth. S/p Ceftriaxone for cellulitis. On gabapentin. On lovenox for DVT prophylaxis - On gabapentin - Check ESR/CRP, could be gout with diuresis but unlikely - D dimer, [place] for DVT: -1 and on Lovenox, I doubt this is a DVT - Voltaren Gel - likely musculoskeletal  Accelerated hypertension with acute heart failure now stable -Continue increased losartan 50 mg daily -Continue Coreg 12.5 mg twice daily -Social work consult for meds at DC  Morbid obesity -Counseled on diet and weight control  OSA  Refusing CPAP, admits to AM headache, frequent night awakenings with SOB -strongly advised to use CPAP every night, patient expressed understanding -Social work consulted.  Needs CPAP at DC as shecannot afford her home CPAP machine.   Steroid induced hyperglycemia in setting of poorly controlled diabetes HA1C 8.4. now off steroids -Diabetic coordinator consulted: Change insulin to Insulin 70/30 20 u BID and continue night sliding scale -Plan to discharge on  insulin 70/30 20 u BID and metformin BID due to lack of insurance   Tobacco use quit smoking in prior to admission  -Chantix   DVT prophylaxis: Lovenox Diet: Heart  healthy carb modified Family Communication: Discussed with husband at bedside yesterday. No family at bedside today Disposition Plan: Needs SW assistance for CPAP, meds and insulin at DC.    Subjective   Continues to have shortness of breath at rest, not tolerating O2 5->4L at bedside, drops to high 80s. Using [place] and having clear sputum production. Complaining of persistent bilateral R>L lower extremity pain around shins described as burning to touch, calf not hurting. Admits to waking up in the middle of the night short of breath here and at home and has headaches in the mornings since refusing CPAP over the past two nights, did not seem to understand that she needs to wear this every night despite our previous talks  Denies any other complaints, remaining 10 points ROS negative.   Objective   Vitals:   08/09/19 2022 08/09/19 2059 08/10/19 0601 08/10/19 0756  BP:  (!) 112/53 109/69   Pulse:  78 89   Resp:  18 18   Temp:  98.2 F (36.8 C) 99.8 F (37.7 C)   TempSrc:  Oral Oral   SpO2: 94% 90% (!) 89% 91%  Weight:   135.4 kg   Height:        Intake/Output Summary (Last 24 hours) at 08/10/2019 0951 Last data filed at 08/10/2019 7494 Gross per 24 hour  Intake 490.2 ml  Output 3800 ml  Net -3309.8 ml   Filed Weights   08/08/19 0432 08/09/19 0626 08/10/19 0601  Weight: (!) 137.8 kg (!) 138.1 kg 135.4 kg    Examination:  Physical Exam Vitals signs and nursing note reviewed.  Constitutional:      Appearance: Normal appearance. She is obese.  HENT:     Head: Normocephalic and atraumatic.     Nose: Nose normal.     Mouth/Throat:     Mouth: Mucous membranes are moist.  Eyes:     Extraocular Movements: Extraocular movements intact.  Neck:     Musculoskeletal: Normal range of motion. No neck rigidity.  Cardiovascular:     Rate and Rhythm: Normal rate and regular rhythm.  Pulmonary:     Breath sounds: Wheezing present.     Comments: Productive cough with clear  sputum Increased WOB with decreasing O2 to 4L and slight drop in SpO2, increased back to 5L Abdominal:     General: Abdomen is flat.     Palpations: Abdomen is soft.  Musculoskeletal:        General: No swelling.     Comments: Bilateral lower extremity tenderness to palpation of skin overlying shin and ankles.  Neurological:     General: No focal deficit present.     Mental Status: She is alert. Mental status is at baseline.  Psychiatric:        Mood and Affect: Mood normal.        Behavior: Behavior normal.     Data Reviewed: I have personally reviewed following labs and imaging studies  CBC: Recent Labs  Lab 08/05/19 0355 08/07/19 0332 08/08/19 0424  WBC 4.8 5.9 5.2  HGB 15.5* 16.6* 16.5*  HCT 54.2* 56.2* 56.3*  MCV 95.8 94.5 94.5  PLT 177 187 496   Basic Metabolic Panel: Recent Labs  Lab 08/04/19 0350 08/05/19 0355 08/06/19 0422 08/07/19 0332 08/08/19 0424 08/09/19 0504  NA 140  138 135 138 140  --   K 4.5 4.1 4.5 4.2 4.3  --   CL 96* 94* 92* 94* 92*  --   CO2 33* 34* 32 33* 37*  --   GLUCOSE 162* 152* 270* 240* 105*  --   BUN 19 17 22* 27* 30*  --   CREATININE 0.81 0.63 0.60 0.64 0.73 0.77  CALCIUM 9.5 8.9 9.2 9.1 9.0  --   MG  --   --   --  2.3  --   --    GFR: Estimated Creatinine Clearance: 110.2 mL/min (by C-G formula based on SCr of 0.77 mg/dL). Liver Function Tests: No results for input(s): AST, ALT, ALKPHOS, BILITOT, PROT, ALBUMIN in the last 168 hours. No results for input(s): LIPASE, AMYLASE in the last 168 hours. No results for input(s): AMMONIA in the last 168 hours. Coagulation Profile: No results for input(s): INR, PROTIME in the last 168 hours. Cardiac Enzymes: No results for input(s): CKTOTAL, CKMB, CKMBINDEX, TROPONINI in the last 168 hours. BNP (last 3 results) No results for input(s): PROBNP in the last 8760 hours. HbA1C: No results for input(s): HGBA1C in the last 72 hours. CBG: Recent Labs  Lab 08/09/19 0811 08/09/19 1225  08/09/19 1655 08/09/19 2056 08/10/19 0801  GLUCAP 127* 211* 169* 212* 169*   Lipid Profile: No results for input(s): CHOL, HDL, LDLCALC, TRIG, CHOLHDL, LDLDIRECT in the last 72 hours. Thyroid Function Tests: No results for input(s): TSH, T4TOTAL, FREET4, T3FREE, THYROIDAB in the last 72 hours. Anemia Panel: No results for input(s): VITAMINB12, FOLATE, FERRITIN, TIBC, IRON, RETICCTPCT in the last 72 hours. Sepsis Labs: Recent Labs  Lab 08/04/19 0350  PROCALCITON <0.10    Recent Results (from the past 240 hour(s))  SARS CORONAVIRUS 2 (TAT 6-24 HRS) Nasopharyngeal Nasopharyngeal Swab     Status: None   Collection Time: 08/02/19  8:05 AM   Specimen: Nasopharyngeal Swab  Result Value Ref Range Status   SARS Coronavirus 2 NEGATIVE NEGATIVE Final    Comment: (NOTE) SARS-CoV-2 target nucleic acids are NOT DETECTED. The SARS-CoV-2 RNA is generally detectable in upper and lower respiratory specimens during the acute phase of infection. Negative results do not preclude SARS-CoV-2 infection, do not rule out co-infections with other pathogens, and should not be used as the sole basis for treatment or other patient management decisions. Negative results must be combined with clinical observations, patient history, and epidemiological information. The expected result is Negative. Fact Sheet for Patients: SugarRoll.be Fact Sheet for Healthcare Providers: https://www.woods-[place]ordia.com/ This test is not yet approved or cleared by the [place] FDA and  has been authorized for detection and/or diagnosis of SARS-CoV-2 by FDA under an Emergency Use Authorization (EUA). This EUA will remain  in effect (meaning this test can be used) for the duration of the COVID-19 declaration under Section 56 4(b)(1) of the Act, 21 U.S.C. section 360bbb-3(b)(1), unless the authorization is terminated or revoked sooner. Performed at [facility], [place]  [address] [place], [place] [address]   Culture, blood (routine x 2)     Status: None   Collection Time: 08/04/19  7:34 AM   Specimen: BLOOD  Result Value Ref Range Status   Specimen Description   Final    BLOOD LEFT ARM Performed at [facility] [address], [place], [place] [address]    Special Requests   Final    BOTTLES DRAWN AEROBIC AND ANAEROBIC Blood Culture results may not be optimal due to an  inadequate volume of blood received in culture bottles Performed at [facility] [address] [place], [place] [address]    Culture   Final    NO GROWTH 5 DAYS Performed at [facility], [place] [address], [place], [place] [address]    Report Status 08/09/2019 FINAL  Final  Culture, blood (routine x 2)     Status: None   Collection Time: 08/04/19  7:34 AM   Specimen: BLOOD LEFT HAND  Result Value Ref Range Status   Specimen Description   Final    BLOOD LEFT HAND Performed at [facility] [address], [place], [place] [address]    Special Requests   Final    BOTTLES DRAWN AEROBIC ONLY Blood Culture results may not be optimal due to an inadequate volume of blood received in culture bottles Performed at [facility] [address] [place], [place] [address]    Culture   Final    NO GROWTH 5 DAYS Performed at [facility], [place] [address], [place], [place] [address]    Report Status 08/09/2019 FINAL  Final         Radiology Studies: No results found.      Scheduled Meds: . azithromycin  500 mg Oral Daily  . benzonatate  100 mg Oral TID  . carvedilol  12.5 mg Oral BID WC  . enoxaparin (LOVENOX) injection  60 mg Subcutaneous Q24H  . fluticasone  1 spray Each Nare Daily  . furosemide  60 mg Intravenous Q12H  . gabapentin  300 mg Oral TID  . insulin aspart  0-5 Units Subcutaneous QHS  . insulin aspart protamine- aspart  20 Units Subcutaneous BID WC  . ipratropium-albuterol  3 mL  Nebulization BID  . losartan  50 mg Oral Daily  . nystatin  5 mL Oral QID  . pantoprazole  40 mg Oral BID AC  . sodium chloride flush  3 mL Intravenous Q12H  . varenicline  0.5 mg Oral Daily   Continuous Infusions: . sodium chloride    . cefTRIAXone (ROCEPHIN)  IV Stopped (08/09/19 1844)     LOS: 8 days    Time spent: 30 minutes with over 50% of the time coordinating the patient's care    Harold Hedge, DO [facility] Pager [phone]  If 7PM-7AM, please contact night-coverage [email or web address] Password [email or web address] 08/10/2019, 9:51 AM

## 2019-08-11 DIAGNOSIS — I5031 Acute diastolic (congestive) heart failure: Secondary | ICD-10-CM

## 2019-08-11 LAB — CBC
HCT: 53.7 % — ABNORMAL HIGH (ref 36.0–46.0)
Hemoglobin: 15.7 g/dL — ABNORMAL HIGH (ref 12.0–15.0)
MCH: 28 pg (ref 26.0–34.0)
MCHC: 29.2 g/dL — ABNORMAL LOW (ref 30.0–36.0)
MCV: 95.7 fL (ref 80.0–100.0)
Platelets: 169 10*3/uL (ref 150–400)
RBC: 5.61 MIL/uL — ABNORMAL HIGH (ref 3.87–5.11)
RDW: 14.6 % (ref 11.5–15.5)
WBC: 4.9 10*3/uL (ref 4.0–10.5)
nRBC: 0 % (ref 0.0–0.2)

## 2019-08-11 LAB — MAGNESIUM: Magnesium: 2 mg/dL (ref 1.7–2.4)

## 2019-08-11 LAB — BASIC METABOLIC PANEL
Anion gap: 10 (ref 5–15)
BUN: 28 mg/dL — ABNORMAL HIGH (ref 6–20)
CO2: 34 mmol/L — ABNORMAL HIGH (ref 22–32)
Calcium: 9 mg/dL (ref 8.9–10.3)
Chloride: 92 mmol/L — ABNORMAL LOW (ref 98–111)
Creatinine, Ser: 0.67 mg/dL (ref 0.44–1.00)
GFR calc Af Amer: >60 mL/min (ref >60)
GFR calc non Af Amer: >60 mL/min (ref >60)
Glucose, Bld: 156 mg/dL — ABNORMAL HIGH (ref 70–99)
Potassium: 4.3 mmol/L (ref 3.5–5.1)
Sodium: 136 mmol/L (ref 135–145)

## 2019-08-11 LAB — GLUCOSE, CAPILLARY
Glucose-Capillary: 164 mg/dL — ABNORMAL HIGH (ref 70–99)
Glucose-Capillary: 109 mg/dL — ABNORMAL HIGH (ref 70–99)
Glucose-Capillary: 169 mg/dL — ABNORMAL HIGH (ref 70–99)
Glucose-Capillary: 229 mg/dL — ABNORMAL HIGH (ref 70–99)

## 2019-08-11 MED ORDER — FUROSEMIDE 40 MG PO TABS
60.0000 mg | ORAL_TABLET | Freq: Every day | ORAL | Status: DC
  Administered 2019-08-12: 60 mg via ORAL
  Filled 2019-08-11: qty 1

## 2019-08-11 MED ORDER — BARIATRIC ROLLATOR MISC: 1.0000 | Freq: Every day | 0 refills | Status: DC

## 2019-08-11 NOTE — Progress Notes (Signed)
NAME:  Sharon Swanson, MRN:  [id number], DOB:  10-05-1960, LOS: 9 ADMISSION DATE:  08/02/2019, CONSULTATION DATE: 11/13 REFERRING MD:  Dr. Neysa Bonito, CHIEF COMPLAINT:  SOB   Brief History   58 y/o morbidly obese female with acute hypoxic respiratory failure,  admitted 11/7 accelerated HTN, decompensated OSA/OHS, w/ decompensated cor pulmonale and diastolic dysfxn.   ->had not been on meds for over 2 months ->previously advised to be on CPAP but could not afford co-pay  History of present illness   58 y/o morbidly obese female with PMH as below presented to the ED 08/02/19 with c/o SOB, DOE, orthopnea and bilat lower leg swelling x 2 weeks.  She also reported fever of 101 and nonproductive cough x 1 day.  Of note, pt is unemployed, thus has lost her medical insurance and reports having stopped taking her medications a few months pta, and she is unable to afford a CPAP machine.  Her BP was 201/108 upon arrival to the ED and she was mildly hypoxic, requiring 2 L O2 to maintain spO2 > 90%.  Since admission, she has been treated with empiric abx to cover for CAP and RLE cellulitis.  CTA neg for PE. She has been diuresed for CHF exacerbation.  She remained hypoxic w/ upper airway wheezing and therefore pulmonary asked to eval. At time of evaluation: 5 liter [place] 95% (supine position)  Past Medical History  HTN COPD Type 2 DM, insulin-dependent Asthma Obesity BMI 48.41 OSA  Significant Hospital Events   11/07 Admit   Consults:  11/12 PCCM consulted   Procedures:    Significant Diagnostic Tests:     Micro Data:  SARS-CoV2 11/7 >> neg Blood cx 11/9 >>   Antimicrobials:  Keflex 11/7 >> 11/9 Azithro 11/9 >>  Rocephin 11/9 >>   Interim history/subjective:  Pt reports she feels ready to go home.  Is interested in any outpatient resources she can have.   Objective   Blood pressure 122/65, pulse 76, temperature 98.6 F (37 C), temperature source Oral, resp. rate 18, height 5\' 7"  (1.702 m),  weight (!) 138.2 kg, SpO2 94 %.        Intake/Output Summary (Last 24 hours) at 08/11/2019 1318 Last data filed at 08/11/2019 [id number] Gross per 24 hour  Intake 600 ml  Output 2250 ml  Net -1650 ml   Filed Weights   08/09/19 0626 08/10/19 0601 08/11/19 0513  Weight: (!) 138.1 kg 135.4 kg (!) 138.2 kg    Examination: General: adult female sitting up in chair in NAD HEENT: MM pink/moist, tolerating PO's, anicteric Neuro: AAOx4, speech clear, MAE  CV: s1s2 rrr, no m/r/g, radial pulses +2 symmetrical  PULM:  Even/non-labored on 2L at rest, sats 98% at rest, 91% with exertion on 2L, lungs bilaterally clear anterior, diminished bases  GI: soft, bsx4 active  Extremities: warm/dry, no edema  Skin: no rashes or lesions  Resolved Hospital Problem list      Assessment & Plan:   Acute Hypoxemic Respiratory Failure superimposed on Chronic Hypercarbia Secondary to pulmonary edema due to decompensated diastolic HF, cor pulmonale in setting of untreated OSA, accelerated HTN.    P: Ambulatory O2 needs assessment > desats to 84% on RA, will need 2L O2 to maintain sats  Will need outpatient follow up for sleep evaluation  Note elevated CO2 on BMP CM working toward getting patient a CPAP for home Home O2 arranged  Defer home diuretic regimen to primary  Mobilize / PT efforts   Possible  CAP  Wheezing  P: Completed abx Monitor fever curve / WBC trend   HTN P: Continue cozaar, coreg  Best practice:  Diet: per primary  DVT prophylaxis: enoxaparin  GI prophylaxis: PPI  Glucose control: SSI  Mobility: As tolerated  Code Status: Full Code  Family Communication: Patient updated on plan of care 11/16 Disposition: Per primary.  Patient given information on [employer].   PCCM will sign off.  Please call back if new needs arise.   Labs   CBC: Recent Labs  Lab 08/05/19 0355 08/07/19 0332 08/08/19 0424 08/10/19 1030 08/11/19 0820  WBC 4.8 5.9 5.2 5.3 4.9   HGB 15.5* 16.6* 16.5* 15.4* 15.7*  HCT 54.2* 56.2* 56.3* 51.9* 53.7*  MCV 95.8 94.5 94.5 95.2 95.7  PLT 177 187 195 173 [id number]    Basic Metabolic Panel: Recent Labs  Lab 08/06/19 0422 08/07/19 0332 08/08/19 0424 08/09/19 0504 08/10/19 1030 08/11/19 0820  NA 135 138 140  --  135 136  K 4.5 4.2 4.3  --  4.3 4.3  CL 92* 94* 92*  --  91* 92*  CO2 32 33* 37*  --  34* 34*  GLUCOSE 270* 240* 105*  --  216* 156*  BUN 22* 27* 30*  --  31* 28*  CREATININE 0.60 0.64 0.73 0.77 0.78 0.67  CALCIUM 9.2 9.1 9.0  --  9.0 9.0  MG  --  2.3  --   --  2.1 2.0   GFR: Estimated Creatinine Clearance: 111.6 mL/min (by C-G formula based on SCr of 0.67 mg/dL). Recent Labs  Lab 08/07/19 0332 08/08/19 0424 08/10/19 1030 08/11/19 0820  WBC 5.9 5.2 5.3 4.9    Liver Function Tests: No results for input(s): AST, ALT, ALKPHOS, BILITOT, PROT, ALBUMIN in the last 168 hours. No results for input(s): LIPASE, AMYLASE in the last 168 hours. No results for input(s): AMMONIA in the last 168 hours.  ABG    Component Value Date/Time   TCO2 29 08/04/2016 1728     Coagulation Profile: No results for input(s): INR, PROTIME in the last 168 hours.  Cardiac Enzymes: No results for input(s): CKTOTAL, CKMB, CKMBINDEX, TROPONINI in the last 168 hours.  HbA1C: Hgb A1c MFr Bld  Date/Time Value Ref Range Status  08/02/2019 01:32 PM 8.4 (H) 4.8 - 5.6 % Final    Comment:    (NOTE) Pre diabetes:          5.7%-6.4% Diabetes:              >6.4% Glycemic control for   <7.0% adults with diabetes   01/19/2017 07:50 PM 7.0 (H) 4.8 - 5.6 % Final    Comment:    (NOTE)         Pre-diabetes: 5.7 - 6.4         Diabetes: >6.4         Glycemic control for adults with diabetes: <7.0     CBG: Recent Labs  Lab 08/10/19 0801 08/10/19 1223 08/10/19 1707 08/10/19 2131 08/11/19 0734  GLUCAP 169* 156* 183* 190* 164*    Critical care time: n/a    Noe Gens, NP-C [facility] 08/11/2019,  1:18 PM

## 2019-08-11 NOTE — TOC Progression Note (Signed)
Transition of Care [facility]) - Progression Note    Patient Details  Name: Sharon Swanson MRN: [id number] Date of Birth: [birth year]  Transition of Care [facility]) CM/SW Contact  Purcell Mouton, RN Phone Number: 08/11/2019, 3:30 PM  Clinical Narrative:    Pt's PCP follow up on Weds 11/18 at 9:20 AM at [facility].  Following up on CPAP. HHPT with [facility] at home, home O2 with [employer].   Expected Discharge Plan: Home/Self Care Barriers to Discharge: Inadequate or no insurance, Continued Medical Work up  Expected Discharge Plan and Services Expected Discharge Plan: Home/Self Care In-house Referral: Clinical Social Work     Living arrangements for the past 2 months: Single Family Home                                       Social Determinants of Health (SDOH) Interventions    Readmission Risk Interventions No flowsheet data found.

## 2019-08-11 NOTE — Progress Notes (Signed)
Physical Therapy Treatment Patient Details Name: Sharon Swanson MRN: [id number] DOB: 07-16-1961 Today's Date: 08/11/2019    SATURATION QUALIFICATIONS: (This note is used to comply with regulatory documentation for home oxygen)  Patient Saturations on Room Air at Rest = 86%  Patient Saturations on [employer] while Ambulating = 84%  Patient Saturations on 2 Liters of oxygen while Ambulating = 91%      History of Present Illness Patient is a 58 year old female with history of hypertension, COPD, diabetes mellitus, IDDM, just quit smoking who presented to ED with 2 weeks of worsening shortness of breath.  She was admitted with resp failure, acute CHF exacerbation.  CTA chest negative for PE and doppler negative for DVT. ,    PT Comments    Pt reports fatigue and general weakness. Bil LE instability/weakness noted during ambulation, even with use of rollator. One seated rest break taken during session. Dyspnea 2/4 with activity. Will continue to follow and progress activity as tolerated.    Follow Up Recommendations  Home health PT(may need to consider cardiopulmonary rehab at some point???)     Equipment Recommendations  bariatric rollator (4 wheeled walker)    Recommendations for Other Services       Precautions / Restrictions Precautions Precautions: Fall Precaution Comments: monitor O2 Restrictions Weight Bearing Restrictions: No    Mobility  Bed Mobility               General bed mobility comments: oob in recliner  Transfers Overall transfer level: Needs assistance Equipment used: 4-wheeled walker;None Transfers: Sit to/from Stand Sit to Stand: Min guard         General transfer comment: for safety.  Ambulation/Gait Ambulation/Gait assistance: Min guard Gait Distance (Feet): 70 Feet(x2) Assistive device: 4-wheeled walker Gait Pattern/deviations: Step-through pattern;Decreased stride length     General Gait Details: close guard for safety. LE  weakness/instability noted even with use of RW. 1 seated rest break taken/need. Cues for safety, pursed lip breathing, monitoring need for rest breaks. Slow gait speed. Pt c/o feeling very fatigued.   Stairs             Wheelchair Mobility    Modified Rankin (Stroke Patients Only)       Balance Overall balance assessment: Needs assistance         Standing balance support: Bilateral upper extremity supported Standing balance-Leahy Scale: Poor                              Cognition Arousal/Alertness: Awake/alert Behavior During Therapy: WFL for tasks assessed/performed Overall Cognitive Status: Within Functional Limits for tasks assessed                                        Exercises      General Comments        Pertinent Vitals/Pain Pain Assessment: 0-10 Pain Score: 7  Pain Location: headache Pain Descriptors / Indicators: Aching Pain Intervention(s): Limited activity within patient's tolerance;Monitored during session    Home Living                      Prior Function            PT Goals (current goals can now be found in the care plan section) Progress towards PT goals: Progressing toward goals    Frequency  Min 3X/week      PT Plan Current plan remains appropriate    Co-evaluation              AM-PAC PT "6 Clicks" Mobility   Outcome Measure  Help needed turning from your back to your side while in a flat bed without using bedrails?: None Help needed moving from lying on your back to sitting on the side of a flat bed without using bedrails?: None Help needed moving to and from a bed to a chair (including a wheelchair)?: A Little Help needed standing up from a chair using your arms (e.g., wheelchair or bedside chair)?: A Little Help needed to walk in hospital room?: A Little Help needed climbing 3-5 steps with a railing? : A Little 6 Click Score: 20    End of Session Equipment Utilized  During Treatment: Oxygen Activity Tolerance: Patient limited by fatigue Patient left: in chair;with call bell/phone within reach   PT Visit Diagnosis: Muscle weakness (generalized) (M62.81);Unsteadiness on feet (R26.81)     Time: [id number] PT Time Calculation (min) (ACUTE ONLY): 24 min  Charges:  $Gait Training: 23-37 mins                      Weston Anna, PT Acute Rehabilitation Services Pager: [phone] Office: [phone]

## 2019-08-11 NOTE — Progress Notes (Signed)
PROGRESS NOTE    Sharon Swanson    Code Status: Full Code  [id number] DOB: Jun 28, 1961 DOA: 08/02/2019  PCP: Eloise Levels, NP    Hospital Summary  Patient is a 58 year old female with history of hypertension, COPD, diabetes mellitus, IDDM, smoking, just quit presented to ED with 2 weeks of worsening shortness of breath. Per patient, she had stopped taking her medication 2 weeks ago as she lost her insurance. She started noticing shortness of breath, worse with exertion, also has not been able to sleep, has been sitting up on the couch for last 2 weeks. She also noticed swelling in her both lower legs, worsening. Right lower leg was becoming more tight, tender and red. Patient reported that she had a fever of 101 F last night with coughing. She also reports chest tightness with exertion. No nausea or vomiting, stated she had diarrhea last week. No recent long distance travels. Patient has been home due to work injury since April 2020. COVID-19 test negative BP 201/108 at the time of admission.  Since being hospitalized patient has been treated with IV Lasix for diastolic heart failure exacerbation as well as antibiotics for CAP and right lower extremity cellulitis.  Symptoms have improved but not resolved.  Pulmonary was consulted on 11/12  A & P   Principal Problem:   Acute respiratory failure with hypoxia (HCC) Active Problems:   BMI 45.0-49.9, adult (HCC)   COPD (chronic obstructive pulmonary disease) (HCC)   Insulin-requiring or dependent type II diabetes mellitus (HCC)   Essential hypertension   Acute CHF (congestive heart failure) (HCC)   Cellulitis   Acute hypoxic respiratory failure with superimposed chronic hypercarbia in setting of pulmonary edema secondary to Acute Diastolic Heart Failure Exacerbation, cor pulmonale secondary to untreated OSA and accelerated hypertension  improved today -Per pulmonary: Qualifies for home oxygen, needs a CPAP nightly on discharge  given high risk of readmission -Will need OSA/OHS follow-up at discharge with Dr. Vaughan Browner  -Continue flutter valve -IV Lasix to p.o. 60 mg   Wheezing with possible CAP versus upper airway cough syndrome Completed 5 days azithromycin/ceftriaxone. More likely upper airway cough syndrome per pulmonary. -Max therapy for GERD -Discontinue Advair, use neb as needed pending follow-up with Dr. Vaughan Browner  COPD exacerbation secondary to above -as above  Oral thrush improved - nystatin oral solution  Cellulitis of RLE resolved with ceftriaxone  Bilateral leg pain, R>L negative D-dimer low likelihood DVT, minimally elevated inflammatory markers unlikely gout.  Likely musculoskeletal - gabapentin - Voltaren Gel   Accelerated hypertension with acute heart failure now stable -Continue increased losartan 50 mg daily -Continue Coreg 12.5 mg twice daily  Morbid obesity -Counseled on diet and weight control  OSA   RT notes indicating patient is refusing CPAP however patient is adamant that she is not refusing CPAP.  Last night used CPAP and tolerated very well.  She is very interested in using this machine at home.  Positive sleep study in 2017 for severe OSA -Case manager working to get CPAP machine at discharge  Steroid induced hyperglycemia in setting of poorly controlled diabetes HA1C 8.4. now off steroids -Diabetic coordinator consulted: Change insulin to Insulin 70/30 20 u BID and continue night sliding scale -Plan to discharge on insulin 70/30 20 u BID and metformin BID due to lack of insurance   Tobacco use quit smoking in prior to admission  -Chantix   DVT prophylaxis: Lovenox Diet: Heart healthy carb modified Family Communication: No family at bedside today  disposition  Plan: Case manager on board, has set up PCP appointment on Wednesday 11/18 at 9:20 AM [facility]  Home health PT with [facility] and home O2 with adapt.  DC currently pending CPAP  approval patient needs this at discharge   Subjective   Seen and examined sitting upright in chair and resting comfortably.  Significantly improved respiratory status today.  States that she used her CPAP last night and tolerated it well.  She completely understands she needs to use this machine at home and is very willing to try.  Denies any other acute complaints, 10 point review of systems negative.  Objective   Vitals:   08/11/19 0450 08/11/19 0513 08/11/19 0922 08/11/19 1450  BP: 122/65   131/67  Pulse: 76   89  Resp: 18   16  Temp: 98.6 F (37 C)   98.8 F (37.1 C)  TempSrc: Oral   Oral  SpO2: 100%  94% 95%  Weight:  (!) 138.2 kg    Height:        Intake/Output Summary (Last 24 hours) at 08/11/2019 1843 Last data filed at 08/11/2019 1730 Gross per 24 hour  Intake 1200 ml  Output 2700 ml  Net -1500 ml   Filed Weights   08/09/19 0626 08/10/19 0601 08/11/19 0513  Weight: (!) 138.1 kg 135.4 kg (!) 138.2 kg    Examination:  Physical Exam Vitals signs and nursing note reviewed.  Constitutional:      Appearance: Normal appearance. She is obese.     Comments: No conversational dyspnea  HENT:     Head: Normocephalic and atraumatic.     Nose: Nose normal.     Mouth/Throat:     Mouth: Mucous membranes are moist.  Eyes:     Extraocular Movements: Extraocular movements intact.  Neck:     Musculoskeletal: Normal range of motion. No neck rigidity.  Cardiovascular:     Rate and Rhythm: Normal rate and regular rhythm.  Pulmonary:     Effort: Pulmonary effort is normal.     Breath sounds: Normal breath sounds.     Comments: Nasal cannula Abdominal:     General: Abdomen is flat.     Palpations: Abdomen is soft.  Musculoskeletal: Normal range of motion.        General: No swelling.     Right lower leg: She exhibits tenderness.     Left lower leg: She exhibits tenderness.  Neurological:     General: No focal deficit present.     Mental Status: She is alert. Mental  status is at baseline.  Psychiatric:        Mood and Affect: Mood normal.        Behavior: Behavior normal.     Data Reviewed: I have personally reviewed following labs and imaging studies  CBC: Recent Labs  Lab 08/05/19 0355 08/07/19 0332 08/08/19 0424 08/10/19 1030 08/11/19 0820  WBC 4.8 5.9 5.2 5.3 4.9  HGB 15.5* 16.6* 16.5* 15.4* 15.7*  HCT 54.2* 56.2* 56.3* 51.9* 53.7*  MCV 95.8 94.5 94.5 95.2 95.7  PLT 177 187 195 173 [id number]   Basic Metabolic Panel: Recent Labs  Lab 08/06/19 0422 08/07/19 0332 08/08/19 0424 08/09/19 0504 08/10/19 1030 08/11/19 0820  NA 135 138 140  --  135 136  K 4.5 4.2 4.3  --  4.3 4.3  CL 92* 94* 92*  --  91* 92*  CO2 32 33* 37*  --  34* 34*  GLUCOSE 270* 240* 105*  --  216* 156*  BUN 22* 27* 30*  --  31* 28*  CREATININE 0.60 0.64 0.73 0.77 0.78 0.67  CALCIUM 9.2 9.1 9.0  --  9.0 9.0  MG  --  2.3  --   --  2.1 2.0   GFR: Estimated Creatinine Clearance: 111.6 mL/min (by C-G formula based on SCr of 0.67 mg/dL). Liver Function Tests: No results for input(s): AST, ALT, ALKPHOS, BILITOT, PROT, ALBUMIN in the last 168 hours. No results for input(s): LIPASE, AMYLASE in the last 168 hours. No results for input(s): AMMONIA in the last 168 hours. Coagulation Profile: No results for input(s): INR, PROTIME in the last 168 hours. Cardiac Enzymes: No results for input(s): CKTOTAL, CKMB, CKMBINDEX, TROPONINI in the last 168 hours. BNP (last 3 results) No results for input(s): PROBNP in the last 8760 hours. HbA1C: No results for input(s): HGBA1C in the last 72 hours. CBG: Recent Labs  Lab 08/10/19 1707 08/10/19 2131 08/11/19 0734 08/11/19 1255 08/11/19 1649  GLUCAP 183* 190* 164* 109* 169*   Lipid Profile: No results for input(s): CHOL, HDL, LDLCALC, TRIG, CHOLHDL, LDLDIRECT in the last 72 hours. Thyroid Function Tests: No results for input(s): TSH, T4TOTAL, FREET4, T3FREE, THYROIDAB in the last 72 hours. Anemia Panel: No results for  input(s): VITAMINB12, FOLATE, FERRITIN, TIBC, IRON, RETICCTPCT in the last 72 hours. Sepsis Labs: No results for input(s): PROCALCITON, LATICACIDVEN in the last 168 hours.  Recent Results (from the past 240 hour(s))  SARS CORONAVIRUS 2 (TAT 6-24 HRS) Nasopharyngeal Nasopharyngeal Swab     Status: None   Collection Time: 08/02/19  8:05 AM   Specimen: Nasopharyngeal Swab  Result Value Ref Range Status   SARS Coronavirus 2 NEGATIVE NEGATIVE Final    Comment: (NOTE) SARS-CoV-2 target nucleic acids are NOT DETECTED. The SARS-CoV-2 RNA is generally detectable in upper and lower respiratory specimens during the acute phase of infection. Negative results do not preclude SARS-CoV-2 infection, do not rule out co-infections with other pathogens, and should not be used as the sole basis for treatment or other patient management decisions. Negative results must be combined with clinical observations, patient history, and epidemiological information. The expected result is Negative. Fact Sheet for Patients: SugarRoll.be Fact Sheet for Healthcare Providers: https://www.woods-Mallick.com/ This test is not yet approved or cleared by the [place] FDA and  has been authorized for detection and/or diagnosis of SARS-CoV-2 by FDA under an Emergency Use Authorization (EUA). This EUA will remain  in effect (meaning this test can be used) for the duration of the COVID-19 declaration under Section 56 4(b)(1) of the Act, 21 U.S.C. section 360bbb-3(b)(1), unless the authorization is terminated or revoked sooner. Performed at [facility], [place] [address] [place], [place] [address]   Culture, blood (routine x 2)     Status: None   Collection Time: 08/04/19  7:34 AM   Specimen: BLOOD  Result Value Ref Range Status   Specimen Description   Final    BLOOD LEFT ARM Performed at [facility] [address] [place], [place] [address]     Special Requests   Final    BOTTLES DRAWN AEROBIC AND ANAEROBIC Blood Culture results may not be optimal due to an inadequate volume of blood received in culture bottles Performed at [facility] [address] [place], [place] [address]    Culture   Final    NO GROWTH 5 DAYS Performed at [facility], [address] [place], [place] [address]    Report Status 08/09/2019 FINAL  Final  Culture, blood (routine x 2)     Status: None   Collection Time: 08/04/19  7:34 AM   Specimen: BLOOD LEFT HAND  Result Value Ref Range Status   Specimen Description   Final    BLOOD LEFT HAND Performed at [facility] [address] [place], [place] [address]    Special Requests   Final    BOTTLES DRAWN AEROBIC ONLY Blood Culture results may not be optimal due to an inadequate volume of blood received in culture bottles Performed at [facility] [address] [place], [place] [address]    Culture   Final    NO GROWTH 5 DAYS Performed at [facility], [place] [address], [place], [place] [address]    Report Status 08/09/2019 FINAL  Final         Radiology Studies: Dg Chest Port 1 View  Result Date: 08/10/2019 CLINICAL DATA:  Worsening shortness of breath, cough EXAM: PORTABLE CHEST 1 VIEW COMPARISON:  08/06/2019 FINDINGS: Cardiomegaly. No significant change in AP portable examination with mild diffuse interstitial opacity and left basilar atelectasis or consolidation. The visualized skeletal structures are unremarkable. IMPRESSION: 1. No significant change in AP portable examination with mild diffuse interstitial opacity and left basilar atelectasis or consolidation, consistent with edema or infection. 2.  Cardiomegaly. Electronically Signed   By: Eddie Candle M.D.   On: 08/10/2019 17:00        Scheduled Meds: . benzonatate  100 mg Oral TID  . carvedilol  12.5 mg Oral BID WC  . diclofenac Sodium  2 g Topical QID  .  enoxaparin (LOVENOX) injection  60 mg Subcutaneous Q24H  . fluticasone  1 spray Each Nare Daily  . furosemide  60 mg Intravenous Q12H  . furosemide  60 mg Oral Daily  . gabapentin  300 mg Oral TID  . insulin aspart  0-5 Units Subcutaneous QHS  . insulin aspart protamine- aspart  20 Units Subcutaneous BID WC  . losartan  50 mg Oral Daily  . nystatin  5 mL Oral QID  . pantoprazole  40 mg Oral BID AC  . sodium chloride flush  3 mL Intravenous Q12H  . varenicline  0.5 mg Oral Daily   Continuous Infusions: . sodium chloride       LOS: 9 days    Time spent: 30 minutes with over 50% of the time coordinating the patient's care    Harold Hedge, DO [facility] Pager [phone]  If 7PM-7AM, please contact night-coverage [email or web address] Password [facility] 08/11/2019, 6:43 PM

## 2019-08-12 LAB — GLUCOSE, CAPILLARY
Glucose-Capillary: 147 mg/dL — ABNORMAL HIGH (ref 70–99)
Glucose-Capillary: 190 mg/dL — ABNORMAL HIGH (ref 70–99)

## 2019-08-12 LAB — CBC
HCT: 53.5 % — ABNORMAL HIGH (ref 36.0–46.0)
Hemoglobin: 15.6 g/dL — ABNORMAL HIGH (ref 12.0–15.0)
MCH: 27.8 pg (ref 26.0–34.0)
MCHC: 29.2 g/dL — ABNORMAL LOW (ref 30.0–36.0)
MCV: 95.2 fL (ref 80.0–100.0)
Platelets: 187 10*3/uL (ref 150–400)
RBC: 5.62 MIL/uL — ABNORMAL HIGH (ref 3.87–5.11)
RDW: 14.5 % (ref 11.5–15.5)
WBC: 4.4 10*3/uL (ref 4.0–10.5)
nRBC: 0 % (ref 0.0–0.2)

## 2019-08-12 LAB — BASIC METABOLIC PANEL
Anion gap: 10 (ref 5–15)
BUN: 27 mg/dL — ABNORMAL HIGH (ref 6–20)
CO2: 36 mmol/L — ABNORMAL HIGH (ref 22–32)
Calcium: 9.2 mg/dL (ref 8.9–10.3)
Chloride: 91 mmol/L — ABNORMAL LOW (ref 98–111)
Creatinine, Ser: 0.83 mg/dL (ref 0.44–1.00)
GFR calc Af Amer: >60 mL/min (ref >60)
GFR calc non Af Amer: >60 mL/min (ref >60)
Glucose, Bld: 155 mg/dL — ABNORMAL HIGH (ref 70–99)
Potassium: 4.5 mmol/L (ref 3.5–5.1)
Sodium: 137 mmol/L (ref 135–145)

## 2019-08-12 MED ORDER — FUROSEMIDE 20 MG PO TABS: 60.0000 mg | ORAL_TABLET | Freq: Every day | ORAL | 1 refills | Status: DC

## 2019-08-12 MED ORDER — INSULIN ASPART PROT & ASPART (70-30 MIX) 100 UNIT/ML ~~LOC~~ SUSP: 20.0000 [IU] | Freq: Two times a day (BID) | SUBCUTANEOUS | 11 refills | Status: DC

## 2019-08-12 MED ORDER — LOSARTAN POTASSIUM 50 MG PO TABS: 50.0000 mg | ORAL_TABLET | Freq: Every day | ORAL | 0 refills | Status: DC

## 2019-08-12 MED ORDER — IPRATROPIUM-ALBUTEROL 0.5-2.5 (3) MG/3ML IN SOLN: 3.0000 mL | Freq: Four times a day (QID) | RESPIRATORY_TRACT | 1 refills | Status: DC | PRN

## 2019-08-12 MED ORDER — PANTOPRAZOLE SODIUM 40 MG PO TBEC: 40.0000 mg | DELAYED_RELEASE_TABLET | Freq: Two times a day (BID) | ORAL | 1 refills | Status: DC

## 2019-08-12 MED ORDER — VARENICLINE TARTRATE 0.5 MG PO TABS: 0.5000 mg | ORAL_TABLET | Freq: Two times a day (BID) | ORAL | 1 refills | Status: DC

## 2019-08-12 MED ORDER — CARVEDILOL 12.5 MG PO TABS: 12.5000 mg | ORAL_TABLET | Freq: Two times a day (BID) | ORAL | 1 refills | Status: DC

## 2019-08-12 MED ORDER — INSULIN ASPART 100 UNIT/ML ~~LOC~~ SOLN: 0.0000 [IU] | Freq: Every day | SUBCUTANEOUS | 11 refills | Status: DC

## 2019-08-12 NOTE — TOC Progression Note (Addendum)
Transition of Care [facility]) - Progression Note    Patient Details  Name: Sharon Swanson MRN: [id number] Date of Birth: 1961-08-04  [facility] CM/SW Contact  Purcell Mouton, RN Phone Number: 08/12/2019, 11:16 AM  Clinical Narrative:     Pt will discharge home with LOG for CPAP, Home O2 from [facility] and [facility] for HH[facility]. Pt has an appointment at [facility] in AM.   Expected Discharge Plan: Home/Self Care Barriers to Discharge: Inadequate or no insurance, Continued Medical Work up  Expected Discharge Plan and Services Expected Discharge Plan: Home/Self Care In-house Referral: Clinical Social Work     Living arrangements for the past 2 months: Single Family Home                                       Social Determinants of Health (SDOH) Interventions    Readmission Risk Interventions No flowsheet data found.

## 2019-08-12 NOTE — Discharge Summary (Signed)
Physician Discharge Summary  Sharon Swanson RDE:[id number] DOB: September 25, 1961 DOA: 08/02/2019  PCP: Eloise Levels, NP  Admit date: 08/02/2019 Discharge date: 08/12/2019   Code Status: Prior  Admitted From: Home Discharged to: [place]: PT Equipment/Devices: Bariatric Rollator, O2 Discharge Condition: Improving  Recommendations for Outpatient Follow-up   1. Please follow up BMP/CBC 2. Pt will discharge home with LOG for CPAP, Home O2 from [facility] and [facility] for HHPT. Pt has an appointment at [facility] in AM 3. Recommend starting Metformin 500 mg twice daily 4. Follow-up volume status on Lasix 5. Follow-up with pulmonology on 11/30.  Hospital Summary  Patient is a 58 year old female with history of hypertension, COPD, diabetes mellitus, IDDM, smoking, just quit presented to ED with 2 weeks of worsening shortness of breath. Per patient, she had stopped taking her medication 2 weeks ago as she lost her insurance. She started noticing shortness of breath, worse with exertion, also has not been able to sleep, has been sitting up on the couch for last 2 weeks. She also noticed swelling in her both lower legs, worsening. Right lower leg was becoming more tight, tender and red. Patient reported that she had a fever of 101 F prior to admission night with coughing. She also reports chest tightness with exertion. No nausea or vomiting, stated she had diarrhea week prior. No recent long distance travels. Patient has been home due to work injury since April 2020. COVID-19 test negative BP 201/108 at the time of admission.  Since being hospitalized patient has been treated with IV Lasix for diastolic heart failure exacerbation as well as 5 days antibiotics for CAP and right lower extremity cellulitis, both of which resolved.  Pulmonary was consulted on 11/12 due to persistent O2 requirements.  See below for further details  Patient was discharged in improving and stable condition on 11/17  with 2 L nasal cannula and plan to get CPAP at home.  Also had home appointment and primary care appointment.  A & P   Principal Problem:   Acute respiratory failure with hypoxia (HCC) Active Problems:   BMI 45.0-49.9, adult (HCC)   COPD (chronic obstructive pulmonary disease) (HCC)   Insulin-requiring or dependent type II diabetes mellitus (HCC)   Essential hypertension   Acute CHF (congestive heart failure) (HCC)   Cellulitis   Acute hypoxic respiratory failure with superimposed chronic hypercarbia in setting of pulmonary edema secondary to Acute Diastolic Heart Failure Exacerbation, cor pulmonale secondary to untreated OSA and accelerated hypertension   continues to improve well especially when sitting upright without any conversational dyspnea today and still requiring 2 L nasal cannula. -Per pulmonary: Will need OSA/OHS follow-up at discharge with Dr. Vaughan Browner  -Discharged on Lasix p.o. 60 mg.  Follow-up on lab work and volume status  Wheezing with possible CAP versus upper airway cough syndrome Completed 5 days azithromycin/ceftriaxone. More likely upper airway cough syndrome per pulmonary. -Max therapy for GERD -Discontinued Advair, use neb as needed pending follow-up with pulmonology  COPD exacerbation secondary to above -as above  Oral thrush  resolved with nystatin  Cellulitis of RLE resolved with ceftriaxone  Bilateral leg pain, R>L  Suspect neuropathic - gabapentin  Accelerated hypertension with acute heart failure  now stable -Continue increased losartan 50 mg daily -Continue Coreg 12.5 mg twice daily  Morbid obesity -Counseled on diet and weight control  OSA   Sleep study in 2017 and records.  Patient was not using CPAP machine at home and she could not afford.  Did very well with CPAP during her hospitalization.  Case management worked very hard to help patient get approved and set up for home.  See details above  Steroid induced hyperglycemia in  setting of poorly controlled diabetes  HA1C 8.4. now off steroids -Diabetic coordinator consulted: Discharged on insulin 70/30 20 u BID -Recommend starting Metformin  Tobacco use quit smoking in prior to admission  -Chantix    Consultants  . Pulmonology  Procedures  . None  Antibiotics  5 days azithromycin/ceftriaxone   Subjective  Patient seen and examined sitting upright in bedside chair on nasal cannula with no conversational dyspnea.  Denies any acute events.  Used CPAP last night and tolerated very well.  She is excited and anticipating discharge today.  Tolerating diet and ambulating comfortably with O2.  Denies any chest pain, nausea, vomiting.  Any other complaints.   Objective   Discharge Exam: Vitals:   08/11/19 2223 08/12/19 0535  BP:  125/71  Pulse: 80 84  Resp: 20 20  Temp:  98.5 F (36.9 C)  SpO2: 98% 94%   Vitals:   08/11/19 2052 08/11/19 2223 08/12/19 0525 08/12/19 0535  BP: 113/65   125/71  Pulse: 87 80  84  Resp: _0 Temp: 98.4 F (36.9 C)   98.5 F (36.9 C)  TempSrc:    Oral  SpO2: 92% 98%  94%  Weight:   (!) 139 kg   Height:        Physical Exam Vitals signs and nursing note reviewed.  Constitutional:      Appearance: Normal appearance. She is obese.  HENT:     Head: Normocephalic and atraumatic.     Nose: Nose normal.     Mouth/Throat:     Mouth: Mucous membranes are moist.  Eyes:     Extraocular Movements: Extraocular movements intact.  Neck:     Musculoskeletal: Normal range of motion. No neck rigidity.  Cardiovascular:     Rate and Rhythm: Normal rate and regular rhythm.  Pulmonary:     Effort: Pulmonary effort is normal.     Breath sounds: Wheezing present.  Abdominal:     General: Abdomen is flat.     Palpations: Abdomen is soft.  Musculoskeletal: Normal range of motion.        General: No swelling.  Neurological:     General: No focal deficit present.     Mental Status: She is alert. Mental status is at  baseline.  Psychiatric:        Mood and Affect: Mood normal.        Behavior: Behavior normal.       The results of significant diagnostics from this hospitalization (including imaging, microbiology, ancillary and laboratory) are listed below for reference.     Microbiology: Recent Results (from the past 240 hour(s))  Culture, blood (routine x 2)     Status: None   Collection Time: 08/04/19  7:34 AM   Specimen: BLOOD  Result Value Ref Range Status   Specimen Description   Final    BLOOD LEFT ARM Performed at [facility] [address] [place], [place] [address]    Special Requests   Final    BOTTLES DRAWN AEROBIC AND ANAEROBIC Blood Culture results may not be optimal due to an inadequate volume of blood received in culture bottles Performed at [facility] [address] [place], [place] [address]    Culture   Final  NO GROWTH 5 DAYS Performed at [facility], [place] [address], [place], [place] [address]    Report Status 08/09/2019 FINAL  Final  Culture, blood (routine x 2)     Status: None   Collection Time: 08/04/19  7:34 AM   Specimen: BLOOD LEFT HAND  Result Value Ref Range Status   Specimen Description   Final    BLOOD LEFT HAND Performed at [facility] [address], [place], [place] [address]    Special Requests   Final    BOTTLES DRAWN AEROBIC ONLY Blood Culture results may not be optimal due to an inadequate volume of blood received in culture bottles Performed at [facility] [address], [place], [place] [address]    Culture   Final    NO GROWTH 5 DAYS Performed at [facility], [place] [address] [place], [place] [address]    Report Status 08/09/2019 FINAL  Final     Labs: BNP (last 3 results) Recent Labs    08/02/19 0610  BNP 295.6*   Basic Metabolic Panel: Recent Labs  Lab 08/07/19 0332 08/08/19 0424 08/09/19 0504 08/10/19 1030 08/11/19 0820  08/12/19 0531  NA 138 140  --  135 136 137  K 4.2 4.3  --  4.3 4.3 4.5  CL 94* 92*  --  91* 92* 91*  CO2 33* 37*  --  34* 34* 36*  GLUCOSE 240* 105*  --  216* 156* 155*  BUN 27* 30*  --  31* 28* 27*  CREATININE 0.64 0.73 0.77 0.78 0.67 0.83  CALCIUM 9.1 9.0  --  9.0 9.0 9.2  MG 2.3  --   --  2.1 2.0  --    Liver Function Tests: No results for input(s): AST, ALT, ALKPHOS, BILITOT, PROT, ALBUMIN in the last 168 hours. No results for input(s): LIPASE, AMYLASE in the last 168 hours. No results for input(s): AMMONIA in the last 168 hours. CBC: Recent Labs  Lab 08/07/19 0332 08/08/19 0424 08/10/19 1030 08/11/19 0820 08/12/19 0531  WBC 5.9 5.2 5.3 4.9 4.4  HGB 16.6* 16.5* 15.4* 15.7* 15.6*  HCT 56.2* 56.3* 51.9* 53.7* 53.5*  MCV 94.5 94.5 95.2 95.7 95.2  PLT 187 195 173 169 187   Cardiac Enzymes: No results for input(s): CKTOTAL, CKMB, CKMBINDEX, TROPONINI in the last 168 hours. BNP: Invalid input(s): POCBNP CBG: Recent Labs  Lab 08/11/19 1255 08/11/19 1649 08/11/19 2049 08/12/19 0824 08/12/19 1207  GLUCAP 109* 169* 229* 147* 190*   D-Dimer Recent Labs    08/10/19 1030  DDIMER 0.33   Hgb A1c No results for input(s): HGBA1C in the last 72 hours. Lipid Profile No results for input(s): CHOL, HDL, LDLCALC, TRIG, CHOLHDL, LDLDIRECT in the last 72 hours. Thyroid function studies No results for input(s): TSH, T4TOTAL, T3FREE, THYROIDAB in the last 72 hours.  Invalid input(s): FREET3 Anemia work up No results for input(s): VITAMINB12, FOLATE, FERRITIN, TIBC, IRON, RETICCTPCT in the last 72 hours. Urinalysis    Component Value Date/Time   COLORURINE STRAW (A) 08/02/2019 0610   APPEARANCEUR CLEAR 08/02/2019 0610   LABSPEC 1.005 08/02/2019 0610   PHURINE 7.0 08/02/2019 0610   GLUCOSEU NEGATIVE 08/02/2019 0610   GLUCOSEU NEGATIVE 08/12/2012 1516   HGBUR NEGATIVE 08/02/2019 0610   BILIRUBINUR NEGATIVE 08/02/2019 0610   KETONESUR NEGATIVE 08/02/2019 0610   PROTEINUR  100 (A) 08/02/2019 0610   UROBILINOGEN 1.0 08/17/2014 1522   NITRITE NEGATIVE 08/02/2019 0610   LEUKOCYTESUR  NEGATIVE 08/02/2019 0610   Sepsis Labs Invalid input(s): PROCALCITONIN,  WBC,  LACTICIDVEN Microbiology Recent Results (from the past 240 hour(s))  Culture, blood (routine x 2)     Status: None   Collection Time: 08/04/19  7:34 AM   Specimen: BLOOD  Result Value Ref Range Status   Specimen Description   Final    BLOOD LEFT ARM Performed at [facility] [address], [place], [place] [address]    Special Requests   Final    BOTTLES DRAWN AEROBIC AND ANAEROBIC Blood Culture results may not be optimal due to an inadequate volume of blood received in culture bottles Performed at [facility] [address] [place], [place] [address]    Culture   Final    NO GROWTH 5 DAYS Performed at [facility], [place] [address], [place], [place] [address]    Report Status 08/09/2019 FINAL  Final  Culture, blood (routine x 2)     Status: None   Collection Time: 08/04/19  7:34 AM   Specimen: BLOOD LEFT HAND  Result Value Ref Range Status   Specimen Description   Final    BLOOD LEFT HAND Performed at [facility] [address] [place], [place] [address]    Special Requests   Final    BOTTLES DRAWN AEROBIC ONLY Blood Culture results may not be optimal due to an inadequate volume of blood received in culture bottles Performed at [facility] [address], [place], [place] [address]    Culture   Final    NO GROWTH 5 DAYS Performed at [facility], [place] [address] [place], [place] [address]    Report Status 08/09/2019 FINAL  Final    Discharge Instructions     Discharge Instructions    Diet - low sodium heart healthy   Complete by: As directed    Diet Carb Modified   Complete by: As directed    Discharge instructions   Complete by: As directed    You were seen and examined in the  hospital for shortness of breath and cared for by a hospitalist and your lung doctor  Upon Discharge:  -Review your medications at discharge as they were multiple medication adjustments -You have a new PCP and have an appointment set up on 11/18.  Make sure you go to this appointment -Use your CPAP machine nightly -Follow-up with Dr. Vaughan Browner, your lung doctor within the next 2-4 weeks -Use 2 L of oxygen throughout the day and discussed this with your new primary care physician and pulmonologist  Bring all home medications to your appointment to review Request that your primary physician go over all hospital tests and procedures/radiological results at the follow up.   Please get all hospital records sent to your physician by signing a hospital release before you go home.     Read the complete instructions along with all the possible side effects for all the medicines you take and that have been prescribed to you. Take any new medicines after you have completely understood and accept all the possible adverse reactions/side effects.   If you have any questions about your discharge medications or the care you received while you were in the hospital, you can call the unit and asked to speak with the hospitalist on call. Once you are discharged, your primary care physician will handle any further medical issues. Please note that NO REFILLS for any discharge medications will  be authorized, as it is imperative that you return to your primary care physician (or establish a relationship with a primary care physician if you do not have one) for your aftercare needs so that they can reassess your need for medications and monitor your lab values.   Do not drive, operate heavy machinery, perform activities at heights, swimming or participation in water activities or provide baby sitting services if your were admitted for loss of consciousness/seizures or if you are on sedating medications including, but not  limited to benzodiazepines, sleep medications, narcotic pain medications, etc., until you have been cleared to do so by a medical doctor.   Do not take more than prescribed medications.   Wear a seat belt while driving.  If you have smoked or chewed Tobacco in the last 2 years please stop smoking; also stop any regular Alcohol and/or any Recreational drug use including marijuana.  If you experience worsening of your admission symptoms or develop shortness of breath, chest pain, suicidal or homicidal thoughts or experience a life threatening emergency, you must seek medical attention immediately by calling 911 or calling your PCP immediately.   Heart Failure patients record your daily weight using the same scale at the same time of day   Complete by: As directed    Increase activity slowly   Complete by: As directed      Allergies as of 08/12/2019      Reactions   Aspirin Other (See Comments)   Abdominal pain      Medication List    STOP taking these medications   azithromycin 250 MG tablet Commonly known as: ZITHROMAX   Fluticasone-Salmeterol 250-50 MCG/DOSE Aepb Commonly known as: Advair Diskus   guaiFENesin 100 MG/5ML Soln Commonly known as: ROBITUSSIN   hydrochlorothiazide 12.5 MG capsule Commonly known as: MICROZIDE   ibuprofen 600 MG tablet Commonly known as: ADVIL   insulin glargine 100 UNIT/ML injection Commonly known as: LANTUS   ipratropium 0.02 % nebulizer solution Commonly known as: ATROVENT   meclizine 25 MG tablet Commonly known as: ANTIVERT   mometasone-formoterol 100-5 MCG/ACT Aero Commonly known as: Dulera   morphine 15 MG tablet Commonly known as: MSIR   naproxen sodium 220 MG tablet Commonly known as: ALEVE   oxyCODONE-acetaminophen 5-325 MG tablet Commonly known as: PERCOCET/ROXICET   polyvinyl alcohol 1.4 % ophthalmic solution Commonly known as: LIQUIFILM TEARS   predniSONE 20 MG tablet Commonly known as: DELTASONE     TAKE  these medications   albuterol 108 (90 Base) MCG/ACT inhaler Commonly known as: VENTOLIN HFA Inhale 2 puffs into the lungs every 4 (four) hours as needed for wheezing or shortness of breath. What changed: Another medication with the same name was removed. Continue taking this medication, and follow the directions you see here.   Bariatric Rollator Misc 1 Device by Does not apply route daily.   carvedilol 12.5 MG tablet Commonly known as: COREG Take 1 tablet (12.5 mg total) by mouth 2 (two) times daily with a meal.   docusate sodium 100 MG capsule Commonly known as: Colace Take 1 capsule (100 mg total) by mouth 3 (three) times daily as needed.   fluticasone 50 MCG/ACT nasal spray Commonly known as: FLONASE Place 1 spray into both nostrils daily.   furosemide 20 MG tablet Commonly known as: LASIX Take 3 tablets (60 mg total) by mouth daily. Start taking on: August 13, 2019   gabapentin 300 MG capsule Commonly known as: Neurontin Take 1 capsule (300 mg total)  by mouth 3 (three) times daily.   glucose monitoring kit monitoring kit 1 each by Does not apply route as needed for other.   insulin aspart 100 UNIT/ML injection Commonly known as: novoLOG Inject 0-5 Units into the skin at bedtime. What changed:   how much to take  when to take this   insulin aspart protamine- aspart (70-30) 100 UNIT/ML injection Commonly known as: NOVOLOG MIX 70/30 Inject 0.2 mLs (20 Units total) into the skin 2 (two) times daily with a meal.   ipratropium-albuterol 0.5-2.5 (3) MG/3ML Soln Commonly known as: DUONEB Take 3 mLs by nebulization every 6 (six) hours as needed (shortness of breath, wheeze). What changed:   when to take this  reasons to take this   losartan 50 MG tablet Commonly known as: COZAAR Take 1 tablet (50 mg total) by mouth daily. Start taking on: August 13, 2019   methocarbamol 500 MG tablet Commonly known as: Robaxin Take 1 tablet (500 mg total) by mouth 3  (three) times daily.   pantoprazole 40 MG tablet Commonly known as: PROTONIX Take 1 tablet (40 mg total) by mouth 2 (two) times daily before a meal.   varenicline 0.5 MG tablet Commonly known as: CHANTIX Take 1 tablet (0.5 mg total) by mouth 2 (two) times daily.      Follow-up [facility] Follow up.   Specialty: Internal Medicine Why: Please follow up for a new PCP appointment for November 18 at 9:20am. PLease arrive 15 minutes early to fill out paperwork  Contact information: [place] [id number] [place]       Martyn Ehrich, NP Follow up on 08/25/2019.   Specialty: Pulmonary Disease Why: Appt at 10:00 AM.  Please arrive at 9:45 am for hospital follow up.  Contact information: [facility] [address] [place] [address] [phone]          Allergies  Allergen Reactions  . Aspirin Other (See Comments)    Abdominal pain    Time coordinating discharge: Over 30 minutes   SIGNED:   Harold Hedge, D.O. [facility] Pager: [phone]  08/12/2019, 9:44 PM

## 2019-08-13 ENCOUNTER — Telehealth: Payer: Self-pay | Admitting: Family Medicine

## 2019-08-13 ENCOUNTER — Other Ambulatory Visit: Payer: Self-pay

## 2019-08-13 ENCOUNTER — Ambulatory Visit (INDEPENDENT_AMBULATORY_CARE_PROVIDER_SITE_OTHER): Payer: Self-pay | Admitting: Family Medicine

## 2019-08-13 DIAGNOSIS — Z5321 Procedure and treatment not carried out due to patient leaving prior to being seen by health care provider: Secondary | ICD-10-CM

## 2019-08-13 DIAGNOSIS — I10 Essential (primary) hypertension: Secondary | ICD-10-CM

## 2019-08-13 DIAGNOSIS — I5031 Acute diastolic (congestive) heart failure: Secondary | ICD-10-CM

## 2019-08-13 DIAGNOSIS — J9601 Acute respiratory failure with hypoxia: Secondary | ICD-10-CM

## 2019-08-13 DIAGNOSIS — J449 Chronic obstructive pulmonary disease, unspecified: Secondary | ICD-10-CM

## 2019-08-13 DIAGNOSIS — G4733 Obstructive sleep apnea (adult) (pediatric): Secondary | ICD-10-CM

## 2019-08-13 DIAGNOSIS — Z09 Encounter for follow-up examination after completed treatment for conditions other than malignant neoplasm: Secondary | ICD-10-CM

## 2019-08-13 DIAGNOSIS — Z7689 Persons encountering health services in other specified circumstances: Secondary | ICD-10-CM

## 2019-08-13 NOTE — Progress Notes (Deleted)
Virtual Visit via Telephone Note  I connected with Sharon Swanson on 08/13/19 at  9:20 AM EST by telephone and verified that I am speaking with the correct person using two identifiers.   I discussed the limitations, risks, security and privacy concerns of performing an evaluation and management service by telephone and the availability of in person appointments. I also discussed with the patient that there may be a patient responsible charge related to this service. The patient expressed understanding and agreed to proceed.   History of Present Illness:  Past Medical History:  Diagnosis Date  . Acid reflux   . Asthma   . COPD (chronic obstructive pulmonary disease) ([place])   . Diabetes mellitus without complication ([place])    Type II  . Fatty liver 11/08/2011  . Fibroids 11/08/2011  . Hypertension   . Obesity (BMI 30-39.9) 11/08/2011  . Osteoarthritis of right acromioclavicular joint   . Pancreatitis 11/08/2011  . Rotator cuff tear, right   . Sleep apnea    Can not afford Cpap machine  . Ventral hernia     Family History  Problem Relation Age of Onset  . Hypertension Mother   . Diabetes Other     Social History   Socioeconomic History  . Marital status: Single    Spouse name: Not on file  . Number of children: Not on file  . Years of education: Not on file  . Highest education level: Not on file  Occupational History  . Not on file  Social Needs  . Financial resource strain: Not on file  . Food insecurity    Worry: Not on file    Inability: Not on file  . Transportation needs    Medical: Not on file    Non-medical: Not on file  Tobacco Use  . Smoking status: Current Every Day Smoker    Packs/day: 0.50    Types: Cigarettes  . Smokeless tobacco: Never Used  Substance and Sexual Activity  . Alcohol use: No  . Drug use: No  . Sexual activity: Never  Lifestyle  . Physical activity    Days per week: Not on file    Minutes per session: Not on file  . Stress: Not on  file  Relationships  . Social Herbalist on phone: Not on file    Gets together: Not on file    Attends religious service: Not on file    Active member of club or organization: Not on file    Attends meetings of clubs or organizations: Not on file    Relationship status: Not on file  . Intimate partner violence    Fear of current or ex partner: Not on file    Emotionally abused: Not on file    Physically abused: Not on file    Forced sexual activity: Not on file  Other Topics Concern  . Not on file  Social History Narrative  . Not on file    Allergies  Allergen Reactions  . Aspirin Other (See Comments)    Abdominal pain    Current Outpatient Medications on File Prior to Visit  Medication Sig Dispense Refill  . albuterol (PROVENTIL HFA;VENTOLIN HFA) 108 (90 Base) MCG/ACT inhaler Inhale 2 puffs into the lungs every 4 (four) hours as needed for wheezing or shortness of breath. (Patient not taking: Reported on 08/02/2019) 1 Inhaler 2  . carvedilol (COREG) 12.5 MG tablet Take 1 tablet (12.5 mg total) by mouth 2 (two) times daily with  a meal. 60 tablet 1  . docusate sodium (COLACE) 100 MG capsule Take 1 capsule (100 mg total) by mouth 3 (three) times daily as needed. (Patient not taking: Reported on 10/01/2018) 20 capsule 0  . fluticasone (FLONASE) 50 MCG/ACT nasal spray Place 1 spray into both nostrils daily. (Patient not taking: Reported on 08/02/2019) 16 g 0  . furosemide (LASIX) 20 MG tablet Take 3 tablets (60 mg total) by mouth daily. 30 tablet 1  . gabapentin (NEURONTIN) 300 MG capsule Take 1 capsule (300 mg total) by mouth 3 (three) times daily. (Patient not taking: Reported on 08/02/2019) 21 capsule 0  . glucose monitoring kit (FREESTYLE) monitoring kit 1 each by Does not apply route as needed for other. 1 each 0  . insulin aspart (NOVOLOG) 100 UNIT/ML injection Inject 0-5 Units into the skin at bedtime. 10 mL 11  . insulin aspart protamine- aspart (NOVOLOG MIX 70/30)  (70-30) 100 UNIT/ML injection Inject 0.2 mLs (20 Units total) into the skin 2 (two) times daily with a meal. 10 mL 11  . ipratropium-albuterol (DUONEB) 0.5-2.5 (3) MG/3ML SOLN Take 3 mLs by nebulization every 6 (six) hours as needed (shortness of breath, wheeze). 360 mL 1  . losartan (COZAAR) 50 MG tablet Take 1 tablet (50 mg total) by mouth daily. 30 tablet 0  . methocarbamol (ROBAXIN) 500 MG tablet Take 1 tablet (500 mg total) by mouth 3 (three) times daily. (Patient not taking: Reported on 10/01/2018) 30 tablet 0  . Misc. Devices (BARIATRIC ROLLATOR) MISC 1 Device by Does not apply route daily. 1 each 0  . pantoprazole (PROTONIX) 40 MG tablet Take 1 tablet (40 mg total) by mouth 2 (two) times daily before a meal. 60 tablet 1  . varenicline (CHANTIX) 0.5 MG tablet Take 1 tablet (0.5 mg total) by mouth 2 (two) times daily. 60 tablet 1   No current facility-administered medications on file prior to visit.    Current Status: This will be *** initial office visit with me. *** was previously seeing *** for *** PCP needs. Since *** last office visit, she has been admitted to hospital from 07/02/2019-08/12/2019 for Acute Respiratory Failure with Hypoxia. She was tested for COVID19 o 08/02/2019 and found to be negative at that time. Denies chest pain, heart palpitations, cough and shortness of breath.    Today, she is doing well with no complaints. *** denies fevers, chills, fatigue, recent infections, weight loss, and night sweats. *** has not had any headaches, visual changes, dizziness, and falls. No reports of GI problems such as nausea, vomiting, diarrhea, and constipation. *** has no reports of blood in stools, dysuria and hematuria. No depression or anxiety, and denies suicidal ideations, homicidal ideations, or auditory hallucinations. *** denies pain today.     Observations/Objective:  Telephone Virtual Visit   Assessment and Plan:   Follow Up Instructions:    I discussed the assessment  and treatment plan with the patient. The patient was provided an opportunity to ask questions and all were answered. The patient agreed with the plan and demonstrated an understanding of the instructions.   The patient was advised to call back or seek an in-person evaluation if the symptoms worsen or if the condition fails to improve as anticipated.  I provided *** minutes of non-face-to-face time during this encounter.   Azzie Glatter, FNP

## 2019-08-13 NOTE — Telephone Encounter (Signed)
New patient to our office, had recent Hospital stay from 08/02/2019-11/17/202 for Acute Respiratory Failure and Hypoxia. She was previously tested for COVID19 on 08/02/2019 with negative results. Telephone Virtual Visit set up for follow up. Multiple attempts to contact patient via phone, unsuccessful. Unable to leave message.

## 2019-08-25 ENCOUNTER — Inpatient Hospital Stay: Payer: Self-pay | Admitting: Primary Care

## 2019-09-16 ENCOUNTER — Encounter: Payer: Self-pay | Admitting: Family Medicine

## 2019-09-16 ENCOUNTER — Ambulatory Visit (INDEPENDENT_AMBULATORY_CARE_PROVIDER_SITE_OTHER): Payer: Worker's Compensation | Admitting: Family Medicine

## 2019-09-16 ENCOUNTER — Other Ambulatory Visit: Payer: Self-pay

## 2019-09-16 VITALS — BP 168/85 | HR 96 | Temp 98.1°F | Ht 67.0 in | Wt 311.8 lb

## 2019-09-16 DIAGNOSIS — Z6841 Body Mass Index (BMI) 40.0 and over, adult: Secondary | ICD-10-CM

## 2019-09-16 DIAGNOSIS — E1165 Type 2 diabetes mellitus with hyperglycemia: Secondary | ICD-10-CM

## 2019-09-16 DIAGNOSIS — E66813 Obesity, class 3: Secondary | ICD-10-CM

## 2019-09-16 DIAGNOSIS — R7303 Prediabetes: Secondary | ICD-10-CM

## 2019-09-16 DIAGNOSIS — Z0000 Encounter for general adult medical examination without abnormal findings: Secondary | ICD-10-CM

## 2019-09-16 DIAGNOSIS — F419 Anxiety disorder, unspecified: Secondary | ICD-10-CM

## 2019-09-16 DIAGNOSIS — I10 Essential (primary) hypertension: Secondary | ICD-10-CM

## 2019-09-16 DIAGNOSIS — E119 Type 2 diabetes mellitus without complications: Secondary | ICD-10-CM

## 2019-09-16 DIAGNOSIS — Z09 Encounter for follow-up examination after completed treatment for conditions other than malignant neoplasm: Secondary | ICD-10-CM

## 2019-09-16 DIAGNOSIS — Z7689 Persons encountering health services in other specified circumstances: Secondary | ICD-10-CM

## 2019-09-16 DIAGNOSIS — Z794 Long term (current) use of insulin: Secondary | ICD-10-CM

## 2019-09-16 LAB — POCT URINALYSIS DIPSTICK
Bilirubin, UA: NEGATIVE
Blood, UA: NEGATIVE
Glucose, UA: NEGATIVE
Ketones, UA: NEGATIVE
Leukocytes, UA: NEGATIVE
Nitrite, UA: NEGATIVE
Protein, UA: POSITIVE — AB
Spec Grav, UA: 1.025 (ref 1.010–1.025)
Urobilinogen, UA: 0.2 E.U./dL
pH, UA: 6 (ref 5.0–8.0)

## 2019-09-16 LAB — GLUCOSE, POCT (MANUAL RESULT ENTRY): POC Glucose: 184 mg/dl — AB (ref 70–99)

## 2019-09-16 MED ORDER — INSULIN ASPART 100 UNIT/ML ~~LOC~~ SOLN: 0.0000 [IU] | Freq: Every day | SUBCUTANEOUS | 11 refills | Status: DC

## 2019-09-16 MED ORDER — GABAPENTIN 300 MG PO CAPS: 300.0000 mg | ORAL_CAPSULE | Freq: Three times a day (TID) | ORAL | 3 refills | Status: DC

## 2019-09-16 MED ORDER — PANTOPRAZOLE SODIUM 40 MG PO TBEC: 40.0000 mg | DELAYED_RELEASE_TABLET | Freq: Two times a day (BID) | ORAL | 3 refills | Status: DC

## 2019-09-16 MED ORDER — CARVEDILOL 12.5 MG PO TABS: 12.5000 mg | ORAL_TABLET | Freq: Two times a day (BID) | ORAL | 3 refills | Status: DC

## 2019-09-16 MED ORDER — ALBUTEROL SULFATE HFA 108 (90 BASE) MCG/ACT IN AERS: 2.0000 | INHALATION_SPRAY | RESPIRATORY_TRACT | 12 refills | Status: DC | PRN

## 2019-09-16 MED ORDER — VARENICLINE TARTRATE 0.5 MG PO TABS: 0.5000 mg | ORAL_TABLET | Freq: Two times a day (BID) | ORAL | 3 refills | Status: DC

## 2019-09-16 MED ORDER — METHOCARBAMOL 500 MG PO TABS: 500.0000 mg | ORAL_TABLET | Freq: Three times a day (TID) | ORAL | 3 refills | Status: DC

## 2019-09-16 MED ORDER — DOCUSATE SODIUM 100 MG PO CAPS: 100.0000 mg | ORAL_CAPSULE | Freq: Three times a day (TID) | ORAL | 3 refills | Status: DC | PRN

## 2019-09-16 MED ORDER — FLUTICASONE PROPIONATE 50 MCG/ACT NA SUSP: 1.0000 | Freq: Every day | NASAL | 3 refills | Status: DC

## 2019-09-16 MED ORDER — FUROSEMIDE 20 MG PO TABS: 60.0000 mg | ORAL_TABLET | Freq: Every day | ORAL | 3 refills | Status: DC

## 2019-09-16 MED ORDER — FREESTYLE SYSTEM KIT: 1.0000 | PACK | 0 refills | Status: DC | PRN

## 2019-09-16 MED ORDER — IPRATROPIUM-ALBUTEROL 0.5-2.5 (3) MG/3ML IN SOLN: 3.0000 mL | Freq: Four times a day (QID) | RESPIRATORY_TRACT | 12 refills | Status: DC | PRN

## 2019-09-16 MED ORDER — LOSARTAN POTASSIUM 50 MG PO TABS: 50.0000 mg | ORAL_TABLET | Freq: Every day | ORAL | 3 refills | Status: DC

## 2019-09-16 MED ORDER — INSULIN ASPART PROT & ASPART (70-30 MIX) 100 UNIT/ML ~~LOC~~ SUSP: 20.0000 [IU] | Freq: Two times a day (BID) | SUBCUTANEOUS | 12 refills | Status: DC

## 2019-09-16 MED FILL — METHOCARBAMOL 500 MG TABS: 500 | 10 days supply | Qty: 30 | Fill #0

## 2019-09-16 MED FILL — FUROSEMIDE 20 MG TABS: 20 | 10 days supply | Qty: 30 | Fill #0

## 2019-09-16 MED FILL — PANTOPRAZOLE SOD DR 40 MG T: 40 | 30 days supply | Qty: 60 | Fill #0

## 2019-09-16 MED FILL — !NOVOLOG MIX 70/30 VIAL: 70-30/ML | 25 days supply | Qty: 10 | Fill #0

## 2019-09-16 MED FILL — !VENTOLIN HFA INHALER: 108 (90 BAS | 16 days supply | Qty: 18 | Fill #0

## 2019-09-16 MED FILL — LOSARTAN POTASSIUM 50 MG TA: 50 | 30 days supply | Qty: 30 | Fill #0

## 2019-09-16 MED FILL — !TRUE METRIX BLOOD GLUCOSE: 1 days supply | Qty: 1 | Fill #0

## 2019-09-16 MED FILL — !NOVOLOG 100UNITS/ML VIAL: 100/ML | 28 days supply | Qty: 10 | Fill #0

## 2019-09-16 MED FILL — IPRAT-ALBUT 0.5-3(2.5) MG/3: 0.5-2.5 (3) | 30 days supply | Qty: 360 | Fill #0

## 2019-09-16 MED FILL — GABAPENTIN 300 MG CAPSULE: 300 | 30 days supply | Qty: 90 | Fill #0

## 2019-09-16 MED FILL — CARVEDILOL 12.5 MG TABLET: 12.5 | 30 days supply | Qty: 60 | Fill #0

## 2019-09-16 MED FILL — FLUTICASONE PROP 50 MCG SPR: 50 | 30 days supply | Qty: 16 | Fill #0

## 2019-09-16 NOTE — Patient Instructions (Signed)
DASH Eating Plan DASH stands for "Dietary Approaches to Stop Hypertension." The DASH eating plan is a healthy eating plan that has been shown to reduce high blood pressure (hypertension). It may also reduce your risk for type 2 diabetes, heart disease, and stroke. The DASH eating plan may also help with weight loss. What are tips for following this plan?  General guidelines  Avoid eating more than 2,300 mg (milligrams) of salt (sodium) a day. If you have hypertension, you may need to reduce your sodium intake to 1,500 mg a day.  Limit alcohol intake to no more than 1 drink a day for nonpregnant women and 2 drinks a day for men. One drink equals 12 oz of beer, 5 oz of wine, or 1 oz of hard liquor.  Work with your health care provider to maintain a healthy body weight or to lose weight. Ask what an ideal weight is for you.  Get at least 30 minutes of exercise that causes your heart to beat faster (aerobic exercise) most days of the week. Activities may include walking, swimming, or biking.  Work with your health care provider or diet and nutrition specialist (dietitian) to adjust your eating plan to your individual calorie needs. Reading food labels   Check food labels for the amount of sodium per serving. Choose foods with less than 5 percent of the Daily Value of sodium. Generally, foods with less than 300 mg of sodium per serving fit into this eating plan.  To find whole grains, look for the word "whole" as the first word in the ingredient list. Shopping  Buy products labeled as "low-sodium" or "no salt added."  Buy fresh foods. Avoid canned foods and premade or frozen meals. Cooking  Avoid adding salt when cooking. Use salt-free seasonings or herbs instead of table salt or sea salt. Check with your health care provider or pharmacist before using salt substitutes.  Do not fry foods. Cook foods using healthy methods such as baking, boiling, grilling, and broiling instead.  Cook with  heart-healthy oils, such as olive, canola, soybean, or sunflower oil. Meal planning  Eat a balanced diet that includes: ? 5 or more servings of fruits and vegetables each day. At each meal, try to fill half of your plate with fruits and vegetables. ? Up to 6-8 servings of whole grains each day. ? Less than 6 oz of lean meat, poultry, or fish each day. A 3-oz serving of meat is about the same size as a deck of cards. One egg equals 1 oz. ? 2 servings of low-fat dairy each day. ? A serving of nuts, seeds, or beans 5 times each week. ? Heart-healthy fats. Healthy fats called Omega-3 fatty acids are found in foods such as flaxseeds and coldwater fish, like sardines, salmon, and mackerel.  Limit how much you eat of the following: ? Canned or prepackaged foods. ? Food that is high in trans fat, such as fried foods. ? Food that is high in saturated fat, such as fatty meat. ? Sweets, desserts, sugary drinks, and other foods with added sugar. ? Full-fat dairy products.  Do not salt foods before eating.  Try to eat at least 2 vegetarian meals each week.  Eat more home-cooked food and less restaurant, buffet, and fast food.  When eating at a restaurant, ask that your food be prepared with less salt or no salt, if possible. What foods are recommended? The items listed may not be a complete list. Talk with your dietitian about   what dietary choices are best for you. Grains Whole-grain or whole-wheat bread. Whole-grain or whole-wheat pasta. Brown rice. Oatmeal. Quinoa. Bulgur. Whole-grain and low-sodium cereals. Pita bread. Low-fat, low-sodium crackers. Whole-wheat flour tortillas. Vegetables Fresh or frozen vegetables (raw, steamed, roasted, or grilled). Low-sodium or reduced-sodium tomato and vegetable juice. Low-sodium or reduced-sodium tomato sauce and tomato paste. Low-sodium or reduced-sodium canned vegetables. Fruits All fresh, dried, or frozen fruit. Canned fruit in natural juice (without  added sugar). Meat and other protein foods Skinless chicken or turkey. Ground chicken or turkey. Pork with fat trimmed off. Fish and seafood. Egg whites. Dried beans, peas, or lentils. Unsalted nuts, nut butters, and seeds. Unsalted canned beans. Lean cuts of beef with fat trimmed off. Low-sodium, lean deli meat. Dairy Low-fat (1%) or fat-free (skim) milk. Fat-free, low-fat, or reduced-fat cheeses. Nonfat, low-sodium ricotta or cottage cheese. Low-fat or nonfat yogurt. Low-fat, low-sodium cheese. Fats and oils Soft margarine without trans fats. Vegetable oil. Low-fat, reduced-fat, or light mayonnaise and salad dressings (reduced-sodium). Canola, safflower, olive, soybean, and sunflower oils. Avocado. Seasoning and other foods Herbs. Spices. Seasoning mixes without salt. Unsalted popcorn and pretzels. Fat-free sweets. What foods are not recommended? The items listed may not be a complete list. Talk with your dietitian about what dietary choices are best for you. Grains Baked goods made with fat, such as croissants, muffins, or some breads. Dry pasta or rice meal packs. Vegetables Creamed or fried vegetables. Vegetables in a cheese sauce. Regular canned vegetables (not low-sodium or reduced-sodium). Regular canned tomato sauce and paste (not low-sodium or reduced-sodium). Regular tomato and vegetable juice (not low-sodium or reduced-sodium). Pickles. Olives. Fruits Canned fruit in a light or heavy syrup. Fried fruit. Fruit in cream or butter sauce. Meat and other protein foods Fatty cuts of meat. Ribs. Fried meat. Bacon. Sausage. Bologna and other processed lunch meats. Salami. Fatback. Hotdogs. Bratwurst. Salted nuts and seeds. Canned beans with added salt. Canned or smoked fish. Whole eggs or egg yolks. Chicken or turkey with skin. Dairy Whole or 2% milk, cream, and half-and-half. Whole or full-fat cream cheese. Whole-fat or sweetened yogurt. Full-fat cheese. Nondairy creamers. Whipped toppings.  Processed cheese and cheese spreads. Fats and oils Butter. Stick margarine. Lard. Shortening. Ghee. Bacon fat. Tropical oils, such as coconut, palm kernel, or palm oil. Seasoning and other foods Salted popcorn and pretzels. Onion salt, garlic salt, seasoned salt, table salt, and sea salt. Worcestershire sauce. Tartar sauce. Barbecue sauce. Teriyaki sauce. Soy sauce, including reduced-sodium. Steak sauce. Canned and packaged gravies. Fish sauce. Oyster sauce. Cocktail sauce. Horseradish that you find on the shelf. Ketchup. Mustard. Meat flavorings and tenderizers. Bouillon cubes. Hot sauce and Tabasco sauce. Premade or packaged marinades. Premade or packaged taco seasonings. Relishes. Regular salad dressings. Where to find more information:  National Heart, Lung, and Blood Institute: www.nhlbi.nih.gov  American Heart Association: www.heart.org Summary  The DASH eating plan is a healthy eating plan that has been shown to reduce high blood pressure (hypertension). It may also reduce your risk for type 2 diabetes, heart disease, and stroke.  With the DASH eating plan, you should limit salt (sodium) intake to 2,300 mg a day. If you have hypertension, you may need to reduce your sodium intake to 1,500 mg a day.  When on the DASH eating plan, aim to eat more fresh fruits and vegetables, whole grains, lean proteins, low-fat dairy, and heart-healthy fats.  Work with your health care provider or diet and nutrition specialist (dietitian) to adjust your eating plan to your   individual calorie needs. This information is not intended to replace advice given to you by your health care provider. Make sure you discuss any questions you have with your health care provider. Document Released: 08/31/2011 Document Revised: 08/24/2017 Document Reviewed: 09/04/2016 Elsevier Patient Education  2020 [employer]. Diabetes Basics  Diabetes (diabetes mellitus) is a long-term (chronic) disease. It occurs when the body  does not properly use sugar (glucose) that is released from food after you eat. Diabetes may be caused by one or both of these problems:  Your pancreas does not make enough of a hormone called insulin.  Your body does not react in a normal way to insulin that it makes. Insulin lets sugars (glucose) go into cells in your body. This gives you energy. If you have diabetes, sugars cannot get into cells. This causes high blood sugar (hyperglycemia). Follow these instructions at home: How is diabetes treated? You may need to take insulin or other diabetes medicines daily to keep your blood sugar in balance. Take your diabetes medicines every day as told by your doctor. List your diabetes medicines here: Diabetes medicines  Name of medicine: ______________________________ ? Amount (dose): _______________ Time (a.m./p.m.): _______________ Notes: ___________________________________  Name of medicine: ______________________________ ? Amount (dose): _______________ Time (a.m./p.m.): _______________ Notes: ___________________________________  Name of medicine: ______________________________ ? Amount (dose): _______________ Time (a.m./p.m.): _______________ Notes: ___________________________________ If you use insulin, you will learn how to give yourself insulin by injection. You may need to adjust the amount based on the food that you eat. List the types of insulin you use here: Insulin  Insulin type: ______________________________ ? Amount (dose): _______________ Time (a.m./p.m.): _______________ Notes: ___________________________________  Insulin type: ______________________________ ? Amount (dose): _______________ Time (a.m./p.m.): _______________ Notes: ___________________________________  Insulin type: ______________________________ ? Amount (dose): _______________ Time (a.m./p.m.): _______________ Notes: ___________________________________  Insulin type:  ______________________________ ? Amount (dose): _______________ Time (a.m./p.m.): _______________ Notes: ___________________________________  Insulin type: ______________________________ ? Amount (dose): _______________ Time (a.m./p.m.): _______________ Notes: ___________________________________ How do I manage my blood sugar?  Check your blood sugar levels using a blood glucose monitor as directed by your doctor. Your doctor will set treatment goals for you. Generally, you should have these blood sugar levels:  Before meals (preprandial): 80-130 mg/dL (4.4-7.2 mmol/L).  After meals (postprandial): below 180 mg/dL (10 mmol/L).  A1c level: less than 7%. Write down the times that you will check your blood sugar levels: Blood sugar checks  Time: _______________ Notes: ___________________________________  Time: _______________ Notes: ___________________________________  Time: _______________ Notes: ___________________________________  Time: _______________ Notes: ___________________________________  Time: _______________ Notes: ___________________________________  Time: _______________ Notes: ___________________________________  What do I need to know about low blood sugar? Low blood sugar is called hypoglycemia. This is when blood sugar is at or below 70 mg/dL (3.9 mmol/L). Symptoms may include:  Feeling: ? Hungry. ? Worried or nervous (anxious). ? Sweaty and clammy. ? Confused. ? Dizzy. ? Sleepy. ? Sick to your stomach (nauseous).  Having: ? A fast heartbeat. ? A headache. ? A change in your vision. ? Tingling or no feeling (numbness) around the mouth, lips, or tongue. ? Jerky movements that you cannot control (seizure).  Having trouble with: ? Moving (coordination). ? Sleeping. ? Passing out (fainting). ? Getting upset easily (irritability). Treating low blood sugar To treat low blood sugar, eat or drink something sugary right away. If you can think clearly and  swallow safely, follow the 15:15 rule:  Take 15 grams of a fast-acting carb (carbohydrate). Talk with your doctor about how much you  should take.  Some fast-acting carbs are: ? Sugar tablets (glucose pills). Take 3-4 glucose pills. ? 6-8 pieces of hard candy. ? 4-6 oz (120-150 mL) of fruit juice. ? 4-6 oz (120-150 mL) of regular (not diet) soda. ? 1 Tbsp (15 mL) honey or sugar.  Check your blood sugar 15 minutes after you take the carb.  If your blood sugar is still at or below 70 mg/dL (3.9 mmol/L), take 15 grams of a carb again.  If your blood sugar does not go above 70 mg/dL (3.9 mmol/L) after 3 tries, get help right away.  After your blood sugar goes back to normal, eat a meal or a snack within 1 hour. Treating very low blood sugar If your blood sugar is at or below 54 mg/dL (3 mmol/L), you have very low blood sugar (severe hypoglycemia). This is an emergency. Do not wait to see if the symptoms will go away. Get medical help right away. Call your local emergency services (911 in the [place] Do not drive yourself to the hospital. Questions to ask your health care provider  Do I need to meet with a diabetes educator?  What equipment will I need to care for myself at home?  What diabetes medicines do I need? When should I take them?  How often do I need to check my blood sugar?  What number can I call if I have questions?  When is my next doctor's visit?  Where can I find a support group for people with diabetes? Where to find more information  American Diabetes Association: www.diabetes.org  American Association of Diabetes Educators: www.diabeteseducator.org/patient-resources Contact a doctor if:  Your blood sugar is at or above 240 mg/dL (13.3 mmol/L) for 2 days in a row.  You have been sick or have had a fever for 2 days or more, and you are not getting better.  You have any of these problems for more than 6 hours: ? You cannot eat or drink. ? You feel sick to your  stomach (nauseous). ? You throw up (vomit). ? You have watery poop (diarrhea). Get help right away if:  Your blood sugar is lower than 54 mg/dL (3 mmol/L).  You get confused.  You have trouble: ? Thinking clearly. ? Breathing. Summary  Diabetes (diabetes mellitus) is a long-term (chronic) disease. It occurs when the body does not properly use sugar (glucose) that is released from food after digestion.  Take insulin and diabetes medicines as told.  Check your blood sugar every day, as often as told.  Keep all follow-up visits as told by your doctor. This is important. This information is not intended to replace advice given to you by your health care provider. Make sure you discuss any questions you have with your health care provider. Document Released: 12/14/2017 Document Revised: 11/01/2018 Document Reviewed: 12/14/2017 Elsevier Patient Education  2020 [employer].

## 2019-09-16 NOTE — Progress Notes (Signed)
Patient [facility]  New Patient--Hospital Follow Up--Establish Care  Subjective:  Patient ID: Sharon Swanson, female    DOB: 1961-06-15  Age: 58 y.o. MRN: [id number]  CC:  Chief Complaint  Patient presents with  . Follow-up    HTN    HPI Sharon Swanson is a 58 year old female who presents for Hospital Follow Up and to Establish Care today.   Past Medical History:  Diagnosis Date  . Acid reflux   . Asthma   . COPD (chronic obstructive pulmonary disease) ([place])   . Diabetes mellitus without complication ([place])    Type II  . Fatty liver 11/08/2011  . Fibroids 11/08/2011  . Hypertension   . Obesity (BMI 30-39.9) 11/08/2011  . Osteoarthritis of right acromioclavicular joint   . Pancreatitis 11/08/2011  . Rotator cuff tear, right   . Sleep apnea    Can not afford Cpap machine  . Ventral hernia    Current Status: This will be Sharon Swanson's initial office visit with me. She previously had an appointment at our office on 09/12/2019, but left without being seen. She was admitted into the ED for Respiratory Failure on 08/02/2019-08/12/2019. She was prescribed antihypertensive medications at that time, which she states that she has not been began to purchase her medications because of financial reasons. She was previously seeing Dr. Rosalyn Gess for her PCP needs. Since her last office visit, she is doing well with no complaints. She denies fevers, chills, fatigue, recent infections, weight loss, and night sweats. She has not had any headaches, visual changes, dizziness, and falls. No chest pain, heart palpitations, cough and shortness of breath reported. No reports of GI problems such as nausea, vomiting, diarrhea, and constipation. She has no reports of blood in stools, dysuria and hematuria. No depression or anxiety, and denies suicidal ideations, homicidal ideations, or auditory hallucinations. She denies pain today. She is accompanied by her husband  today, who is waiting in the lobby. She has c/o C-PAP machine and not receiving enough oxygen at night. She is currently on 2 liters of continuous oxygen. She is requesting to have oxygen increased at night. Her blood pressures are elevated today, as she states that she has not taken her hypertensive medications as of yet this morning. She has not been monitoring her pre and post prandial blood glucose levels. She. she has seen low range of 160 and high of 310 since his last office visit. She denies fatigue, frequent urination, blurred vision, excessive hunger, excessive thirst, weight gain, weight loss, and poor wound healing. She continues to check her feet regularly. She denies chest pain, cough, shortness of breath, heart palpitations, and falls. She has occasional headaches and dizziness with position changes. Denies severe headaches, confusion, seizures, double vision, and blurred vision, nausea and vomiting.  Past Surgical History:  Procedure Laterality Date  . ORTHOPEDIC SURGERY     R ankle, tendonitis    Family History  Problem Relation Age of Onset  . Hypertension Mother   . Diabetes Other     Social History   Socioeconomic History  . Marital status: Single    Spouse name: Not on file  . Number of children: Not on file  . Years of education: Not on file  . Highest education level: Not on file  Occupational History  . Not on file  Tobacco Use  . Smoking status: Current Every Day Smoker    Packs/day: 0.50    Types: Cigarettes  .  Smokeless tobacco: Never Used  Substance and Sexual Activity  . Alcohol use: No  . Drug use: No  . Sexual activity: Not Currently  Other Topics Concern  . Not on file  Social History Narrative  . Not on file   Social Determinants of Health   Financial Resource Strain:   . Difficulty of Paying Living Expenses: Not on file  Food Insecurity:   . Worried About Charity fundraiser in the Last Year: Not on file  . Ran Out of Food in the Last  Year: Not on file  Transportation Needs:   . Lack of Transportation (Medical): Not on file  . Lack of Transportation (Non-Medical): Not on file  Physical Activity:   . Days of Exercise per Week: Not on file  . Minutes of Exercise per Session: Not on file  Stress:   . Feeling of Stress : Not on file  Social Connections:   . Frequency of Communication with Friends and Family: Not on file  . Frequency of Social Gatherings with Friends and Family: Not on file  . Attends Religious Services: Not on file  . Active Member of Clubs or Organizations: Not on file  . Attends Archivist Meetings: Not on file  . Marital Status: Not on file  Intimate Partner Violence:   . Fear of Current or Ex-Partner: Not on file  . Emotionally Abused: Not on file  . Physically Abused: Not on file  . Sexually Abused: Not on file    Outpatient Medications Prior to Visit  Medication Sig Dispense Refill  . Misc. Devices (BARIATRIC ROLLATOR) MISC 1 Device by Does not apply route daily. (Patient not taking: Reported on 09/16/2019) 1 each 0  . albuterol (PROVENTIL HFA;VENTOLIN HFA) 108 (90 Base) MCG/ACT inhaler Inhale 2 puffs into the lungs every 4 (four) hours as needed for wheezing or shortness of breath. (Patient not taking: Reported on 09/16/2019) 1 Inhaler 2  . carvedilol (COREG) 12.5 MG tablet Take 1 tablet (12.5 mg total) by mouth 2 (two) times daily with a meal. (Patient not taking: Reported on 09/16/2019) 60 tablet 1  . docusate sodium (COLACE) 100 MG capsule Take 1 capsule (100 mg total) by mouth 3 (three) times daily as needed. (Patient not taking: Reported on 09/16/2019) 20 capsule 0  . fluticasone (FLONASE) 50 MCG/ACT nasal spray Place 1 spray into both nostrils daily. (Patient not taking: Reported on 09/16/2019) 16 g 0  . furosemide (LASIX) 20 MG tablet Take 3 tablets (60 mg total) by mouth daily. (Patient not taking: Reported on 09/16/2019) 30 tablet 1  . gabapentin (NEURONTIN) 300 MG capsule Take  1 capsule (300 mg total) by mouth 3 (three) times daily. (Patient not taking: Reported on 09/16/2019) 21 capsule 0  . glucose monitoring kit (FREESTYLE) monitoring kit 1 each by Does not apply route as needed for other. (Patient not taking: Reported on 09/16/2019) 1 each 0  . insulin aspart (NOVOLOG) 100 UNIT/ML injection Inject 0-5 Units into the skin at bedtime. (Patient not taking: Reported on 09/16/2019) 10 mL 11  . insulin aspart protamine- aspart (NOVOLOG MIX 70/30) (70-30) 100 UNIT/ML injection Inject 0.2 mLs (20 Units total) into the skin 2 (two) times daily with a meal. (Patient not taking: Reported on 09/16/2019) 10 mL 11  . ipratropium-albuterol (DUONEB) 0.5-2.5 (3) MG/3ML SOLN Take 3 mLs by nebulization every 6 (six) hours as needed (shortness of breath, wheeze). (Patient not taking: Reported on 09/16/2019) 360 mL 1  . losartan (COZAAR) 50  MG tablet Take 1 tablet (50 mg total) by mouth daily. (Patient not taking: Reported on 09/16/2019) 30 tablet 0  . methocarbamol (ROBAXIN) 500 MG tablet Take 1 tablet (500 mg total) by mouth 3 (three) times daily. (Patient not taking: Reported on 09/16/2019) 30 tablet 0  . pantoprazole (PROTONIX) 40 MG tablet Take 1 tablet (40 mg total) by mouth 2 (two) times daily before a meal. (Patient not taking: Reported on 09/16/2019) 60 tablet 1  . varenicline (CHANTIX) 0.5 MG tablet Take 1 tablet (0.5 mg total) by mouth 2 (two) times daily. (Patient not taking: Reported on 09/16/2019) 60 tablet 1   No facility-administered medications prior to visit.    Allergies  Allergen Reactions  . Aspirin Other (See Comments)    Abdominal pain    ROS Review of Systems  Constitutional: Positive for fatigue.  HENT: Negative.   Eyes: Negative.   Respiratory: Negative.   Cardiovascular: Negative.   Gastrointestinal: Positive for abdominal distention.  Endocrine: Positive for polydipsia and polyuria.  Genitourinary: Negative.   Musculoskeletal: Positive for  arthralgias (generalized ).  Skin: Negative.   Allergic/Immunologic: Negative.   Neurological: Positive for dizziness (occasional ) and headaches (occasional).  Hematological: Negative.   Psychiatric/Behavioral: Negative.       Objective:    Physical Exam  Constitutional: She is oriented to person, place, and time. She appears well-developed and well-nourished.  HENT:  Head: Normocephalic and atraumatic.  Eyes: Conjunctivae are normal.  Cardiovascular: Normal rate, regular rhythm, normal heart sounds and intact distal pulses.  Pulmonary/Chest: Effort normal and breath sounds normal.  Abdominal: Soft. Bowel sounds are normal. She exhibits distension (obese).  Musculoskeletal:        General: Normal range of motion.     Cervical back: Normal range of motion and neck supple.  Neurological: She is alert and oriented to person, place, and time. She has normal reflexes.  Skin: Skin is warm and dry.  Psychiatric: She has a normal mood and affect. Her behavior is normal. Judgment and thought content normal.  Nursing note and vitals reviewed.   BP (!) 168/85   Pulse 96   Temp 98.1 F (36.7 C) (Oral)   Ht 5' 7" (1.702 m)   Wt (!) 311 lb 12.8 oz (141.4 kg)   SpO2 95% Comment: 2 liters of O2  BMI 48.83 kg/m  Wt Readings from Last 3 Encounters:  09/16/19 (!) 311 lb 12.8 oz (141.4 kg)  08/12/19 (!) 306 lb 5.4 oz (139 kg)  09/30/18 (!) 311 lb 1.6 oz (141.1 kg)     Health Maintenance Due  Topic Date Due  . Hepatitis C Screening  [birth year]  . FOOT EXAM  02/02/1971  . OPHTHALMOLOGY EXAM  02/02/1971  . TETANUS/TDAP  02/02/1980  . PAP SMEAR-Modifier  02/01/1982  . MAMMOGRAM  02/02/2011  . INFLUENZA VACCINE  04/26/2019    There are no preventive care reminders to display for this patient.  Lab Results  Component Value Date   TSH 0.491 08/17/2014   Lab Results  Component Value Date   WBC 4.8 09/16/2019   HGB 15.6 09/16/2019   HCT 46.6 09/16/2019   MCV 84 09/16/2019   PLT  198 09/16/2019   Lab Results  Component Value Date   NA 141 09/16/2019   K 4.5 09/16/2019   CO2 22 09/16/2019   GLUCOSE 170 (H) 09/16/2019   BUN 13 09/16/2019   CREATININE 0.59 09/16/2019   BILITOT 0.3 09/16/2019   ALKPHOS 70 09/16/2019  AST 15 09/16/2019   ALT 18 09/16/2019   PROT 6.9 09/16/2019   ALBUMIN 4.1 09/16/2019   CALCIUM 9.8 09/16/2019   ANIONGAP 10 08/12/2019   Lab Results  Component Value Date   CHOL 197 11/07/2011   Lab Results  Component Value Date   HDL 70 11/07/2011   Lab Results  Component Value Date   LDLCALC 107 (H) 11/07/2011   Lab Results  Component Value Date   TRIG 99 11/07/2011   Lab Results  Component Value Date   CHOLHDL 2.8 11/07/2011   Lab Results  Component Value Date   HGBA1C 8.4 (H) 08/02/2019      Assessment & Plan:   1. Hospital discharge follow-up  2. Encounter to establish care  3. Type 2 diabetes mellitus with hyperglycemia, with long-term current use of insulin (HCC) Stable today. Blood glucose level is at 184 today. She will continue medication as prescribed, to decrease foods/beverages high in sugars and carbs and follow Heart Healthy or DASH diet. Increase physical activity to at least 30 minutes cardio exercise daily.   4. Hemoglobin A1C between 7% and 9% indicating borderline diabetic control Hgb A1c at 8.4 today.   5. Essential hypertension Blood pressure is elevated today. She will begin to take hypertensive medications as prescribed. She will continue to take medications as prescribed, to decrease high sodium intake, excessive alcohol intake, increase potassium intake, smoking cessation, and increase physical activity of at least 30 minutes of cardio activity daily. She will continue to follow Heart Healthy or DASH diet.  - Urinalysis Dipstick - CBC with Differential - Comp Met (CMET); Future - Comp Met (CMET)  6. Class 3 obesity with BMI between 45.5-49.9 Body mass index is 48.83 kg/m. Goal BMI  is <30.  Encouraged efforts to reduce weight include engaging in physical activity as tolerated with goal of 150 minutes per week. Improve dietary choices and eat a meal regimen consistent with a Mediterranean or DASH diet. Reduce simple carbohydrates. Do not skip meals and eat healthy snacks throughout the day to avoid over-eating at dinner. Set a goal weight loss that is achievable for you.  7. Anxiety Stable today.  7. Healthcare maintenance - Glucose (CBG)  8. Follow up She will follow up in 1 month.   Meds ordered this encounter  Medications  . albuterol (VENTOLIN HFA) 108 (90 Base) MCG/ACT inhaler    Sig: Inhale 2 puffs into the lungs every 4 (four) hours as needed for wheezing or shortness of breath.    Dispense:  8 g    Refill:  12  . carvedilol (COREG) 12.5 MG tablet    Sig: Take 1 tablet (12.5 mg total) by mouth 2 (two) times daily with a meal.    Dispense:  60 tablet    Refill:  3  . docusate sodium (COLACE) 100 MG capsule    Sig: Take 1 capsule (100 mg total) by mouth 3 (three) times daily as needed.    Dispense:  30 capsule    Refill:  3  . fluticasone (FLONASE) 50 MCG/ACT nasal spray    Sig: Place 1 spray into both nostrils daily.    Dispense:  16 g    Refill:  3  . furosemide (LASIX) 20 MG tablet    Sig: Take 3 tablets (60 mg total) by mouth daily.    Dispense:  30 tablet    Refill:  3  . gabapentin (NEURONTIN) 300 MG capsule    Sig: Take 1 capsule (300  mg total) by mouth 3 (three) times daily.    Dispense:  90 capsule    Refill:  3  . glucose monitoring kit (FREESTYLE) monitoring kit    Sig: 1 each by Does not apply route as needed for other.    Dispense:  1 each    Refill:  0  . insulin aspart (NOVOLOG) 100 UNIT/ML injection    Sig: Inject 0-5 Units into the skin at bedtime.    Dispense:  10 mL    Refill:  11  . insulin aspart protamine- aspart (NOVOLOG MIX 70/30) (70-30) 100 UNIT/ML injection    Sig: Inject 0.2 mLs (20 Units total) into the skin 2 (two) times  daily with a meal.    Dispense:  10 mL    Refill:  12  . ipratropium-albuterol (DUONEB) 0.5-2.5 (3) MG/3ML SOLN    Sig: Take 3 mLs by nebulization every 6 (six) hours as needed (shortness of breath, wheeze).    Dispense:  360 mL    Refill:  12  . losartan (COZAAR) 50 MG tablet    Sig: Take 1 tablet (50 mg total) by mouth daily.    Dispense:  30 tablet    Refill:  3  . methocarbamol (ROBAXIN) 500 MG tablet    Sig: Take 1 tablet (500 mg total) by mouth 3 (three) times daily.    Dispense:  30 tablet    Refill:  3  . pantoprazole (PROTONIX) 40 MG tablet    Sig: Take 1 tablet (40 mg total) by mouth 2 (two) times daily before a meal.    Dispense:  60 tablet    Refill:  3  . varenicline (CHANTIX) 0.5 MG tablet    Sig: Take 1 tablet (0.5 mg total) by mouth 2 (two) times daily.    Dispense:  60 tablet    Refill:  3    Orders Placed This Encounter  Procedures  . CBC with Differential  . Comp Met (CMET)  . Urinalysis Dipstick  . Glucose (CBG)    Referral Orders  No referral(s) requested today    Kathe Becton,  MSN, FNP-BC [facility] [address] [place], [place] [address] [phone] fax   Meds ordered this encounter  Medications  . albuterol (VENTOLIN HFA) 108 (90 Base) MCG/ACT inhaler    Sig: Inhale 2 puffs into the lungs every 4 (four) hours as needed for wheezing or shortness of breath.    Dispense:  8 g    Refill:  12  . carvedilol (COREG) 12.5 MG tablet    Sig: Take 1 tablet (12.5 mg total) by mouth 2 (two) times daily with a meal.    Dispense:  60 tablet    Refill:  3  . docusate sodium (COLACE) 100 MG capsule    Sig: Take 1 capsule (100 mg total) by mouth 3 (three) times daily as needed.    Dispense:  30 capsule    Refill:  3  . fluticasone (FLONASE) 50 MCG/ACT nasal spray    Sig: Place 1 spray into both nostrils daily.    Dispense:  16 g    Refill:  3  . furosemide  (LASIX) 20 MG tablet    Sig: Take 3 tablets (60 mg total) by mouth daily.    Dispense:  30 tablet    Refill:  3  . gabapentin (NEURONTIN) 300 MG capsule    Sig: Take 1 capsule (300  mg total) by mouth 3 (three) times daily.    Dispense:  90 capsule    Refill:  3  . glucose monitoring kit (FREESTYLE) monitoring kit    Sig: 1 each by Does not apply route as needed for other.    Dispense:  1 each    Refill:  0  . insulin aspart (NOVOLOG) 100 UNIT/ML injection    Sig: Inject 0-5 Units into the skin at bedtime.    Dispense:  10 mL    Refill:  11  . insulin aspart protamine- aspart (NOVOLOG MIX 70/30) (70-30) 100 UNIT/ML injection    Sig: Inject 0.2 mLs (20 Units total) into the skin 2 (two) times daily with a meal.    Dispense:  10 mL    Refill:  12  . ipratropium-albuterol (DUONEB) 0.5-2.5 (3) MG/3ML SOLN    Sig: Take 3 mLs by nebulization every 6 (six) hours as needed (shortness of breath, wheeze).    Dispense:  360 mL    Refill:  12  . losartan (COZAAR) 50 MG tablet    Sig: Take 1 tablet (50 mg total) by mouth daily.    Dispense:  30 tablet    Refill:  3  . methocarbamol (ROBAXIN) 500 MG tablet    Sig: Take 1 tablet (500 mg total) by mouth 3 (three) times daily.    Dispense:  30 tablet    Refill:  3  . pantoprazole (PROTONIX) 40 MG tablet    Sig: Take 1 tablet (40 mg total) by mouth 2 (two) times daily before a meal.    Dispense:  60 tablet    Refill:  3  . varenicline (CHANTIX) 0.5 MG tablet    Sig: Take 1 tablet (0.5 mg total) by mouth 2 (two) times daily.    Dispense:  60 tablet    Refill:  3   Orders Placed This Encounter  Procedures  . CBC with Differential  . Comp Met (CMET)  . Urinalysis Dipstick  . Glucose (CBG)    Referral Orders  No referral(s) requested today    Kathe Becton,  MSN, FNP-BC [place], [place] [address] [phone]  fax   Problem List Items Addressed This Visit      Cardiovascular and Mediastinum   Essential hypertension   Relevant Medications   carvedilol (COREG) 12.5 MG tablet   furosemide (LASIX) 20 MG tablet   losartan (COZAAR) 50 MG tablet   Other Relevant Orders   Urinalysis Dipstick (Completed)   CBC with Differential (Completed)   Comp Met (CMET) (Completed)     Other   Anxiety    Other Visit Diagnoses    Hospital discharge follow-up    -  Primary   Encounter to establish care       Type 2 diabetes mellitus with hyperglycemia, with long-term current use of insulin (HCC)       Relevant Medications   insulin aspart (NOVOLOG) 100 UNIT/ML injection   insulin aspart protamine- aspart (NOVOLOG MIX 70/30) (70-30) 100 UNIT/ML injection   losartan (COZAAR) 50 MG tablet   Healthcare maintenance       Relevant Orders   Glucose (CBG) (Completed)   Follow up          Meds ordered this encounter  Medications  . albuterol (VENTOLIN HFA) 108 (90 Base) MCG/ACT inhaler    Sig: Inhale 2 puffs into the lungs every 4 (four)  hours as needed for wheezing or shortness of breath.    Dispense:  8 g    Refill:  12  . carvedilol (COREG) 12.5 MG tablet    Sig: Take 1 tablet (12.5 mg total) by mouth 2 (two) times daily with a meal.    Dispense:  60 tablet    Refill:  3  . docusate sodium (COLACE) 100 MG capsule    Sig: Take 1 capsule (100 mg total) by mouth 3 (three) times daily as needed.    Dispense:  30 capsule    Refill:  3  . fluticasone (FLONASE) 50 MCG/ACT nasal spray    Sig: Place 1 spray into both nostrils daily.    Dispense:  16 g    Refill:  3  . furosemide (LASIX) 20 MG tablet    Sig: Take 3 tablets (60 mg total) by mouth daily.    Dispense:  30 tablet    Refill:  3  . gabapentin (NEURONTIN) 300 MG capsule    Sig: Take 1 capsule (300 mg total) by mouth 3 (three) times daily.    Dispense:  90 capsule    Refill:  3  . glucose monitoring kit (FREESTYLE) monitoring kit    Sig: 1  each by Does not apply route as needed for other.    Dispense:  1 each    Refill:  0  . insulin aspart (NOVOLOG) 100 UNIT/ML injection    Sig: Inject 0-5 Units into the skin at bedtime.    Dispense:  10 mL    Refill:  11  . insulin aspart protamine- aspart (NOVOLOG MIX 70/30) (70-30) 100 UNIT/ML injection    Sig: Inject 0.2 mLs (20 Units total) into the skin 2 (two) times daily with a meal.    Dispense:  10 mL    Refill:  12  . ipratropium-albuterol (DUONEB) 0.5-2.5 (3) MG/3ML SOLN    Sig: Take 3 mLs by nebulization every 6 (six) hours as needed (shortness of breath, wheeze).    Dispense:  360 mL    Refill:  12  . losartan (COZAAR) 50 MG tablet    Sig: Take 1 tablet (50 mg total) by mouth daily.    Dispense:  30 tablet    Refill:  3  . methocarbamol (ROBAXIN) 500 MG tablet    Sig: Take 1 tablet (500 mg total) by mouth 3 (three) times daily.    Dispense:  30 tablet    Refill:  3  . pantoprazole (PROTONIX) 40 MG tablet    Sig: Take 1 tablet (40 mg total) by mouth 2 (two) times daily before a meal.    Dispense:  60 tablet    Refill:  3  . varenicline (CHANTIX) 0.5 MG tablet    Sig: Take 1 tablet (0.5 mg total) by mouth 2 (two) times daily.    Dispense:  60 tablet    Refill:  3    Follow-up: Return in about 1 month (around 10/17/2019).    Azzie Glatter, FNP

## 2019-09-17 LAB — CBC WITH DIFFERENTIAL/PLATELET
Basophils Absolute: 0 10*3/uL (ref 0.0–0.2)
Basos: 0 %
EOS (ABSOLUTE): 0.1 10*3/uL (ref 0.0–0.4)
Eos: 3 %
Hematocrit: 46.6 % (ref 34.0–46.6)
Hemoglobin: 15.6 g/dL (ref 11.1–15.9)
Immature Grans (Abs): 0 10*3/uL (ref 0.0–0.1)
Immature Granulocytes: 0 %
Lymphocytes Absolute: 1.2 10*3/uL (ref 0.7–3.1)
Lymphs: 24 %
MCH: 28.2 pg (ref 26.6–33.0)
MCHC: 33.5 g/dL (ref 31.5–35.7)
MCV: 84 fL (ref 79–97)
Monocytes Absolute: 0.5 10*3/uL (ref 0.1–0.9)
Monocytes: 10 %
Neutrophils Absolute: 3.1 10*3/uL (ref 1.4–7.0)
Neutrophils: 63 %
Platelets: 198 10*3/uL (ref 150–450)
RBC: 5.53 x10E6/uL — ABNORMAL HIGH (ref 3.77–5.28)
RDW: 13.4 % (ref 11.7–15.4)
WBC: 4.8 10*3/uL (ref 3.4–10.8)

## 2019-09-17 LAB — COMPREHENSIVE METABOLIC PANEL
ALT: 18 IU/L (ref 0–32)
AST: 15 IU/L (ref 0–40)
Albumin/Globulin Ratio: 1.5 (ref 1.2–2.2)
Albumin: 4.1 g/dL (ref 3.8–4.9)
Alkaline Phosphatase: 70 IU/L (ref 39–117)
BUN/Creatinine Ratio: 22 (ref 9–23)
BUN: 13 mg/dL (ref 6–24)
Bilirubin Total: 0.3 mg/dL (ref 0.0–1.2)
CO2: 22 mmol/L (ref 20–29)
Calcium: 9.8 mg/dL (ref 8.7–10.2)
Chloride: 101 mmol/L (ref 96–106)
Creatinine, Ser: 0.59 mg/dL (ref 0.57–1.00)
GFR calc Af Amer: 117 mL/min/{1.73_m2} (ref >59)
GFR calc non Af Amer: 101 mL/min/{1.73_m2} (ref >59)
Globulin, Total: 2.8 g/dL (ref 1.5–4.5)
Glucose: 170 mg/dL — ABNORMAL HIGH (ref 65–99)
Potassium: 4.5 mmol/L (ref 3.5–5.2)
Sodium: 141 mmol/L (ref 134–144)
Total Protein: 6.9 g/dL (ref 6.0–8.5)

## 2019-09-21 ENCOUNTER — Encounter: Payer: Self-pay | Admitting: Family Medicine

## 2019-09-21 DIAGNOSIS — R7303 Prediabetes: Secondary | ICD-10-CM | POA: Insufficient documentation

## 2019-09-21 DIAGNOSIS — E119 Type 2 diabetes mellitus without complications: Secondary | ICD-10-CM | POA: Insufficient documentation

## 2019-09-21 DIAGNOSIS — E66813 Obesity, class 3: Secondary | ICD-10-CM | POA: Insufficient documentation

## 2019-09-21 DIAGNOSIS — Z6841 Body Mass Index (BMI) 40.0 and over, adult: Secondary | ICD-10-CM | POA: Insufficient documentation

## 2019-09-25 ENCOUNTER — Telehealth: Payer: Self-pay

## 2019-09-25 NOTE — Telephone Encounter (Signed)
Patient [phone] is not currently in service. Will try alternate phone number [phone]. No able to leave message on [phone]. Will try again later.

## 2019-09-29 MED FILL — ?FUROSEMIDE 20 MG TABLET: 20 | 30 days supply | Qty: 90 | Fill #1

## 2019-09-29 NOTE — Telephone Encounter (Signed)
-----   Message from Azzie Glatter, FNP sent at 09/25/2019 11:23 AM EST ----- All labs are stable. Keep follow up appointment. Please inform patient. Thank you.

## 2019-09-29 NOTE — Telephone Encounter (Signed)
Still not able to reach patient by phone.  *All labs are stable. Keep follow up appointment. Please inform patient. Thank you.

## 2019-10-17 ENCOUNTER — Ambulatory Visit (INDEPENDENT_AMBULATORY_CARE_PROVIDER_SITE_OTHER): Payer: Worker's Compensation | Admitting: Family Medicine

## 2019-10-17 ENCOUNTER — Encounter: Payer: Self-pay | Admitting: Family Medicine

## 2019-10-17 ENCOUNTER — Other Ambulatory Visit: Payer: Self-pay

## 2019-10-17 VITALS — BP 171/86 | HR 84 | Temp 98.0°F | Ht 67.0 in | Wt 307.0 lb

## 2019-10-17 DIAGNOSIS — Z09 Encounter for follow-up examination after completed treatment for conditions other than malignant neoplasm: Secondary | ICD-10-CM

## 2019-10-17 DIAGNOSIS — R7309 Other abnormal glucose: Secondary | ICD-10-CM

## 2019-10-17 DIAGNOSIS — K429 Umbilical hernia without obstruction or gangrene: Secondary | ICD-10-CM

## 2019-10-17 DIAGNOSIS — R1033 Periumbilical pain: Secondary | ICD-10-CM

## 2019-10-17 DIAGNOSIS — I10 Essential (primary) hypertension: Secondary | ICD-10-CM

## 2019-10-17 DIAGNOSIS — G4733 Obstructive sleep apnea (adult) (pediatric): Secondary | ICD-10-CM

## 2019-10-17 DIAGNOSIS — Z794 Long term (current) use of insulin: Secondary | ICD-10-CM

## 2019-10-17 DIAGNOSIS — E1165 Type 2 diabetes mellitus with hyperglycemia: Secondary | ICD-10-CM

## 2019-10-17 LAB — POCT GLYCOSYLATED HEMOGLOBIN (HGB A1C): Hemoglobin A1C: 9.2 % — AB (ref 4.0–5.6)

## 2019-10-17 LAB — POCT URINALYSIS DIPSTICK
Bilirubin, UA: NEGATIVE
Blood, UA: NEGATIVE
Glucose, UA: NEGATIVE
Ketones, UA: NEGATIVE
Leukocytes, UA: NEGATIVE
Nitrite, UA: NEGATIVE
Protein, UA: POSITIVE — AB
Spec Grav, UA: 1.02 (ref 1.010–1.025)
Urobilinogen, UA: 0.2 E.U./dL
pH, UA: 6.5 (ref 5.0–8.0)

## 2019-10-17 LAB — GLUCOSE, POCT (MANUAL RESULT ENTRY): POC Glucose: 131 mg/dl — AB (ref 70–99)

## 2019-10-17 NOTE — Progress Notes (Signed)
Established Patient Office Visit  Subjective:  Patient ID: Sharon Swanson, female    DOB: 10/30/1960  Age: 59 y.o. MRN: [id number]  CC:  Chief Complaint  Patient presents with  . Follow-up    DM    HPI Sharon Swanson is a 59 year old female who presents for Follow Up today.   Past Medical History:  Diagnosis Date  . Acid reflux   . Asthma   . COPD (chronic obstructive pulmonary disease) ([place])   . Diabetes ([place])   . Diabetes mellitus without complication ([place])    Type II  . Fatty liver 11/08/2011  . Fibroids 11/08/2011  . Hyperglycemia   . Hypertension   . Obesity (BMI 30-39.9) 11/08/2011  . Osteoarthritis of right acromioclavicular joint   . Pancreatitis 11/08/2011  . Rotator cuff tear, right   . Sleep apnea    Can not afford Cpap machine  . Ventral hernia    Current Status: Since her last office visit, she has elevated blood pressures today, which she monitors at home and reports that they are about the same. She denies visual changes, chest pain, cough, shortness of breath, heart palpitations, and falls. She has occasional headaches and dizziness with position changes. Denies severe headaches, confusion, seizures, double vision, and blurred vision, nausea and vomiting. Her most recent normal range of preprandial blood glucose levels have been between 150-160. She has seen low range of 140 and high of 165 since his last office visit. She denies fatigue, frequent urination, blurred vision, excessive hunger, excessive thirst, weight gain, weight loss, and poor wound healing. She continues to check her feet regularly. Her anxiety is stable today. She denies suicidal ideations, homicidal ideations, or auditory hallucinations. She continues to smoke 2 cigarettes daily. She denies fevers, chills, recent infections, weight loss, and night sweats. No reports of GI problems such as nausea, vomiting, diarrhea, and constipation. She has no reports of blood in stools, dysuria and hematuria.  She denies pain today.   Past Surgical History:  Procedure Laterality Date  . ORTHOPEDIC SURGERY     R ankle, tendonitis    Family History  Problem Relation Age of Onset  . Hypertension Mother   . Diabetes Other     Social History   Socioeconomic History  . Marital status: Single    Spouse name: Not on file  . Number of children: Not on file  . Years of education: Not on file  . Highest education level: Not on file  Occupational History  . Not on file  Tobacco Use  . Smoking status: Current Every Day Smoker    Packs/day: 0.50    Types: Cigarettes  . Smokeless tobacco: Never Used  Substance and Sexual Activity  . Alcohol use: No  . Drug use: No  . Sexual activity: Not Currently  Other Topics Concern  . Not on file  Social History Narrative  . Not on file   Social Determinants of Health   Financial Resource Strain:   . Difficulty of Paying Living Expenses: Not on file  Food Insecurity:   . Worried About Charity fundraiser in the Last Year: Not on file  . Ran Out of Food in the Last Year: Not on file  Transportation Needs:   . Lack of Transportation (Medical): Not on file  . Lack of Transportation (Non-Medical): Not on file  Physical Activity:   . Days of Exercise per Week: Not on file  . Minutes of Exercise per Session: Not on  file  Stress:   . Feeling of Stress : Not on file  Social Connections:   . Frequency of Communication with Friends and Family: Not on file  . Frequency of Social Gatherings with Friends and Family: Not on file  . Attends Religious Services: Not on file  . Active Member of Clubs or Organizations: Not on file  . Attends Archivist Meetings: Not on file  . Marital Status: Not on file  Intimate Partner Violence:   . Fear of Current or Ex-Partner: Not on file  . Emotionally Abused: Not on file  . Physically Abused: Not on file  . Sexually Abused: Not on file    Outpatient Medications Prior to Visit  Medication Sig Dispense  Refill  . albuterol (VENTOLIN HFA) 108 (90 Base) MCG/ACT inhaler Inhale 2 puffs into the lungs every 4 (four) hours as needed for wheezing or shortness of breath. 8 g 12  . carvedilol (COREG) 12.5 MG tablet Take 1 tablet (12.5 mg total) by mouth 2 (two) times daily with a meal. 60 tablet 3  . docusate sodium (COLACE) 100 MG capsule Take 1 capsule (100 mg total) by mouth 3 (three) times daily as needed. 30 capsule 3  . fluticasone (FLONASE) 50 MCG/ACT nasal spray Place 1 spray into both nostrils daily. 16 g 3  . furosemide (LASIX) 20 MG tablet Take 3 tablets (60 mg total) by mouth daily. 30 tablet 3  . gabapentin (NEURONTIN) 300 MG capsule Take 1 capsule (300 mg total) by mouth 3 (three) times daily. 90 capsule 3  . glucose monitoring kit (FREESTYLE) monitoring kit 1 each by Does not apply route as needed for other. 1 each 0  . insulin aspart (NOVOLOG) 100 UNIT/ML injection Inject 0-5 Units into the skin at bedtime. 10 mL 11  . insulin aspart protamine- aspart (NOVOLOG MIX 70/30) (70-30) 100 UNIT/ML injection Inject 0.2 mLs (20 Units total) into the skin 2 (two) times daily with a meal. 10 mL 12  . ipratropium-albuterol (DUONEB) 0.5-2.5 (3) MG/3ML SOLN Take 3 mLs by nebulization every 6 (six) hours as needed (shortness of breath, wheeze). 360 mL 12  . losartan (COZAAR) 50 MG tablet Take 1 tablet (50 mg total) by mouth daily. 30 tablet 3  . methocarbamol (ROBAXIN) 500 MG tablet Take 1 tablet (500 mg total) by mouth 3 (three) times daily. 30 tablet 3  . Misc. Devices (BARIATRIC ROLLATOR) MISC 1 Device by Does not apply route daily. (Patient not taking: Reported on 09/16/2019) 1 each 0  . pantoprazole (PROTONIX) 40 MG tablet Take 1 tablet (40 mg total) by mouth 2 (two) times daily before a meal. 60 tablet 3  . varenicline (CHANTIX) 0.5 MG tablet Take 1 tablet (0.5 mg total) by mouth 2 (two) times daily. 60 tablet 3   No facility-administered medications prior to visit.    Allergies  Allergen  Reactions  . Aspirin Other (See Comments)    Abdominal pain    ROS Review of Systems    Objective:    Physical Exam  Constitutional:  She arrives on 3 liters of continuous oxygen today. She arrives with Rollator.     BP (!) 171/86   Pulse 84   Temp 98 F (36.7 C) (Oral)   Ht 5' 7"  (1.702 m)   Wt (!) 307 lb (139.3 kg)   SpO2 100% Comment: 3 LITERS OF O2  BMI 48.08 kg/m  Wt Readings from Last 3 Encounters:  10/17/19 (!) 307 lb (139.3 kg)  09/16/19 (!) 311 lb 12.8 oz (141.4 kg)  08/12/19 (!) 306 lb 5.4 oz (139 kg)     Health Maintenance Due  Topic Date Due  . Hepatitis C Screening  1961/04/07  . FOOT EXAM  02/02/1971  . OPHTHALMOLOGY EXAM  02/02/1971  . TETANUS/TDAP  02/02/1980  . PAP SMEAR-Modifier  02/01/1982  . MAMMOGRAM  02/02/2011  . INFLUENZA VACCINE  04/26/2019    There are no preventive care reminders to display for this patient.  Lab Results  Component Value Date   TSH 0.491 08/17/2014   Lab Results  Component Value Date   WBC 4.8 09/16/2019   HGB 15.6 09/16/2019   HCT 46.6 09/16/2019   MCV 84 09/16/2019   PLT 198 09/16/2019   Lab Results  Component Value Date   NA 141 09/16/2019   K 4.5 09/16/2019   CO2 22 09/16/2019   GLUCOSE 170 (H) 09/16/2019   BUN 13 09/16/2019   CREATININE 0.59 09/16/2019   BILITOT 0.3 09/16/2019   ALKPHOS 70 09/16/2019   AST 15 09/16/2019   ALT 18 09/16/2019   PROT 6.9 09/16/2019   ALBUMIN 4.1 09/16/2019   CALCIUM 9.8 09/16/2019   ANIONGAP 10 08/12/2019   Lab Results  Component Value Date   CHOL 197 11/07/2011   Lab Results  Component Value Date   HDL 70 11/07/2011   Lab Results  Component Value Date   LDLCALC 107 (H) 11/07/2011   Lab Results  Component Value Date   TRIG 99 11/07/2011   Lab Results  Component Value Date   CHOLHDL 2.8 11/07/2011   Lab Results  Component Value Date   HGBA1C 9.2 (A) 10/17/2019      Assessment & Plan:   1. Type 2 diabetes mellitus with hyperglycemia, with  long-term current use of insulin (HCC) She will continue medication as prescribed, to decrease foods/beverages high in sugars and carbs and follow Heart Healthy or DASH diet. Increase physical activity to at least 30 minutes cardio exercise daily.  - POCT urinalysis dipstick - POCT glycosylated hemoglobin (Hb A1C) - POCT glucose (manual entry)  2. Hgb A1c > 9% indicating poor diabetic control Hgb at 9.2 today. Monitor.   3. OSA (obstructive sleep apnea)  4. Umbilical hernia without obstruction and without gangrene - US Abdomen Complete; Future  5. Abdominal pain, acute, periumbilical - US Abdomen Complete; Future  6. Essential hypertension She will continue present plan and medications as prescribed. She will continue to take medications as prescribed, to decrease high sodium intake, excessive alcohol intake, increase potassium intake, smoking cessation, and increase physical activity of at least 30 minutes of cardio activity daily. She will continue to follow Heart Healthy or DASH diet.  7. Follow up She will follow up in 3 months.  No orders of the defined types were placed in this encounter.   Orders Placed This Encounter  Procedures  . US Abdomen Complete  . POCT urinalysis dipstick  . POCT glycosylated hemoglobin (Hb A1C)  . POCT glucose (manual entry)    Referral Orders  No referral(s) requested today    Kathe Becton,  MSN, FNP-BC [address] [place], [place] [address] [phone] fax    Problem List Items Addressed This Visit      Cardiovascular and Mediastinum   Essential hypertension     Respiratory   OSA (obstructive sleep apnea)     Other   Umbilical hernia  Relevant Orders   US Abdomen Complete    Other Visit Diagnoses    Type 2 diabetes mellitus with hyperglycemia, with long-term current use of insulin ([place])    -  Primary   Relevant Orders    POCT urinalysis dipstick (Completed)   POCT glycosylated hemoglobin (Hb A1C) (Completed)   POCT glucose (manual entry) (Completed)   Abdominal pain, acute, periumbilical       Relevant Orders   US Abdomen Complete   Follow up          No orders of the defined types were placed in this encounter.   Follow-up: Return in about 3 months (around 01/15/2020).    Azzie Glatter, FNP

## 2019-10-19 DIAGNOSIS — R7309 Other abnormal glucose: Secondary | ICD-10-CM | POA: Insufficient documentation

## 2019-10-19 DIAGNOSIS — R1033 Periumbilical pain: Secondary | ICD-10-CM | POA: Insufficient documentation

## 2019-10-20 ENCOUNTER — Other Ambulatory Visit: Payer: Self-pay | Admitting: Family Medicine

## 2019-10-21 MED FILL — ALBUTEROL SULFATE HFA 108 (: 108 (90 BAS | 16 days supply | Qty: 18 | Fill #1

## 2019-10-21 MED FILL — LOSARTAN POTASSIUM 50 MG TA: 50 | 30 days supply | Qty: 30 | Fill #1

## 2019-10-21 MED FILL — FLUTICASONE PROP 50 MCG SPR: 50 | 30 days supply | Qty: 16 | Fill #1

## 2019-10-21 MED FILL — GABAPENTIN 300 MG CAPSULE: 300 | 30 days supply | Qty: 90 | Fill #1

## 2019-10-21 MED FILL — IPRAT-ALBUT 0.5-3(2.5) MG/3: 0.5-2.5 (3) | 30 days supply | Qty: 360 | Fill #1

## 2019-10-21 MED FILL — PANTOPRAZOLE SOD DR 40 MG T: 40 | 30 days supply | Qty: 60 | Fill #1

## 2019-10-21 MED FILL — NOVOLOG MIX 70/30 VIAL: (70-30) 100 | 25 days supply | Qty: 10 | Fill #1

## 2019-10-23 ENCOUNTER — Other Ambulatory Visit: Payer: Self-pay | Admitting: Family Medicine

## 2019-10-23 ENCOUNTER — Telehealth: Payer: Self-pay | Admitting: Family Medicine

## 2019-10-23 DIAGNOSIS — I10 Essential (primary) hypertension: Secondary | ICD-10-CM

## 2019-10-23 MED FILL — ?CARVEDILOL 12.5 MG TABLET: 12.5 | 30 days supply | Qty: 60 | Fill #1

## 2019-10-23 NOTE — Progress Notes (Unsigned)
l °

## 2019-10-23 NOTE — Telephone Encounter (Signed)
Mrs. Sharon Swanson stated you where going to add another blood pressure medication. If so please send to [facility].  Thank you,  Jorja Loa

## 2019-10-28 ENCOUNTER — Ambulatory Visit: Payer: Worker's Compensation

## 2019-10-28 ENCOUNTER — Other Ambulatory Visit: Payer: Self-pay

## 2019-10-28 DIAGNOSIS — I10 Essential (primary) hypertension: Secondary | ICD-10-CM

## 2019-10-28 NOTE — Progress Notes (Signed)
Sharon Swanson arrived for her Nurse Visit blood pressure check. She reported taking her medication at 6 AM. At 8:10 bp was 147/81. HR 87,  then 141/74, HR 83. (No complaints).

## 2019-11-07 MED FILL — ?FUROSEMIDE 20 MG TABLET: 20 | 30 days supply | Qty: 90 | Fill #0

## 2019-11-07 MED FILL — METHOCARBAMOL 500 MG TABS: 500 | 10 days supply | Qty: 30 | Fill #1

## 2019-11-26 ENCOUNTER — Other Ambulatory Visit: Payer: Self-pay | Admitting: Family Medicine

## 2019-11-26 MED FILL — LOSARTAN POTASSIUM 50 MG TA: 50 | 30 days supply | Qty: 30 | Fill #2

## 2019-11-26 MED FILL — GABAPENTIN 300 MG CAPSULE: 300 | 30 days supply | Qty: 90 | Fill #2

## 2019-11-26 MED FILL — METHOCARBAMOL 500 MG TABS: 500 | 10 days supply | Qty: 30 | Fill #0

## 2019-11-26 MED FILL — PANTOPRAZOLE SOD DR 40 MG T: 40 | 30 days supply | Qty: 60 | Fill #2

## 2019-12-03 MED FILL — NOVOLOG MIX 70/30 VIAL: (70-30) 100 | 25 days supply | Qty: 10 | Fill #2

## 2019-12-08 MED FILL — METHOCARBAMOL 500 MG TABS: 500 | 10 days supply | Qty: 30 | Fill #1

## 2019-12-08 MED FILL — ?CARVEDILOL 12.5 MG TABLET: 12.5 | 30 days supply | Qty: 60 | Fill #2

## 2019-12-17 MED FILL — METHOCARBAMOL 500 MG TABS: 500 | 10 days supply | Qty: 30 | Fill #2

## 2019-12-17 MED FILL — FUROSEMIDE 20 MG TABS: 20 | 30 days supply | Qty: 90 | Fill #1

## 2019-12-29 MED FILL — LOSARTAN POTASSIUM 50 MG TA: 50 | 30 days supply | Qty: 30 | Fill #3

## 2019-12-29 MED FILL — METHOCARBAMOL 500 MG TABS: 500 | 10 days supply | Qty: 30 | Fill #3

## 2019-12-29 MED FILL — ?PANTOPRAZOLE SO DR 40MG TA: 40 | 30 days supply | Qty: 60 | Fill #3

## 2019-12-31 MED FILL — GABAPENTIN 300 MG CAPSULE: 300 | 30 days supply | Qty: 90 | Fill #3

## 2020-01-19 ENCOUNTER — Other Ambulatory Visit: Payer: Self-pay

## 2020-01-19 ENCOUNTER — Ambulatory Visit (INDEPENDENT_AMBULATORY_CARE_PROVIDER_SITE_OTHER): Payer: Self-pay | Admitting: Family Medicine

## 2020-01-19 ENCOUNTER — Encounter: Payer: Self-pay | Admitting: Family Medicine

## 2020-01-19 VITALS — BP 148/90 | HR 88 | Temp 97.8°F | Ht 67.0 in | Wt 313.8 lb

## 2020-01-19 DIAGNOSIS — E1165 Type 2 diabetes mellitus with hyperglycemia: Secondary | ICD-10-CM

## 2020-01-19 DIAGNOSIS — R0602 Shortness of breath: Secondary | ICD-10-CM

## 2020-01-19 DIAGNOSIS — Z794 Long term (current) use of insulin: Secondary | ICD-10-CM

## 2020-01-19 DIAGNOSIS — I10 Essential (primary) hypertension: Secondary | ICD-10-CM

## 2020-01-19 DIAGNOSIS — G4733 Obstructive sleep apnea (adult) (pediatric): Secondary | ICD-10-CM

## 2020-01-19 DIAGNOSIS — R7303 Prediabetes: Secondary | ICD-10-CM

## 2020-01-19 DIAGNOSIS — R1033 Periumbilical pain: Secondary | ICD-10-CM

## 2020-01-19 DIAGNOSIS — R809 Proteinuria, unspecified: Secondary | ICD-10-CM

## 2020-01-19 DIAGNOSIS — J45909 Unspecified asthma, uncomplicated: Secondary | ICD-10-CM

## 2020-01-19 DIAGNOSIS — R062 Wheezing: Secondary | ICD-10-CM

## 2020-01-19 DIAGNOSIS — Z09 Encounter for follow-up examination after completed treatment for conditions other than malignant neoplasm: Secondary | ICD-10-CM

## 2020-01-19 DIAGNOSIS — J302 Other seasonal allergic rhinitis: Secondary | ICD-10-CM

## 2020-01-19 DIAGNOSIS — R739 Hyperglycemia, unspecified: Secondary | ICD-10-CM

## 2020-01-19 DIAGNOSIS — E119 Type 2 diabetes mellitus without complications: Secondary | ICD-10-CM

## 2020-01-19 DIAGNOSIS — R600 Localized edema: Secondary | ICD-10-CM

## 2020-01-19 DIAGNOSIS — R635 Abnormal weight gain: Secondary | ICD-10-CM

## 2020-01-19 LAB — POCT URINALYSIS DIPSTICK
Bilirubin, UA: NEGATIVE
Blood, UA: NEGATIVE
Glucose, UA: NEGATIVE
Ketones, UA: NEGATIVE
Leukocytes, UA: NEGATIVE
Nitrite, UA: NEGATIVE
Protein, UA: POSITIVE — AB
Spec Grav, UA: 1.025 (ref 1.010–1.025)
Urobilinogen, UA: 0.2 E.U./dL
pH, UA: 7 (ref 5.0–8.0)

## 2020-01-19 LAB — GLUCOSE, POCT (MANUAL RESULT ENTRY): POC Glucose: 148 mg/dl — AB (ref 70–99)

## 2020-01-19 LAB — POCT GLYCOSYLATED HEMOGLOBIN (HGB A1C): Hemoglobin A1C: 8 % — AB (ref 4.0–5.6)

## 2020-01-19 MED ORDER — CETIRIZINE HCL 10 MG PO TABS: 10.0000 mg | ORAL_TABLET | Freq: Every morning | ORAL | 11 refills | Status: DC

## 2020-01-19 MED ORDER — MONTELUKAST SODIUM 10 MG PO TABS: 10.0000 mg | ORAL_TABLET | Freq: Every day | ORAL | 3 refills | Status: DC

## 2020-01-19 MED ORDER — FUROSEMIDE 40 MG PO TABS: 40.0000 mg | ORAL_TABLET | Freq: Two times a day (BID) | ORAL | 1 refills | Status: DC

## 2020-01-19 MED ORDER — METHOCARBAMOL 500 MG PO TABS: 500.0000 mg | ORAL_TABLET | Freq: Three times a day (TID) | ORAL | 3 refills | Status: DC

## 2020-01-19 MED FILL — METHOCARBAMOL 500 MG TABS: 500 | 30 days supply | Qty: 90 | Fill #0

## 2020-01-19 MED FILL — ?FUROSEMIDE 40 MG TABLET: 40 | 30 days supply | Qty: 60 | Fill #0

## 2020-01-19 MED FILL — ?CARVEDILOL 12.5 MG TABLET: 12.5 | 30 days supply | Qty: 60 | Fill #3

## 2020-01-19 MED FILL — FLUTICASONE PROP 50 MCG SPR: 50 | 30 days supply | Qty: 16 | Fill #2

## 2020-01-19 MED FILL — ?MONTELUKAST SOD 10MG TA: 10 | 30 days supply | Qty: 30 | Fill #0

## 2020-01-19 NOTE — Patient Instructions (Signed)
Shortness of Breath, Adult Shortness of breath means you have trouble breathing. Shortness of breath could be a sign of a medical problem. Follow these instructions at home:   Watch for any changes in your symptoms.  Do not use any products that contain nicotine or tobacco, such as cigarettes, e-cigarettes, and chewing tobacco.  Do not smoke. Smoking can cause shortness of breath. If you need help to quit smoking, ask your doctor.  Avoid things that can make it harder to breathe, such as: ? Mold. ? Dust. ? Air pollution. ? Chemical smells. ? Things that can cause allergy symptoms (allergens), if you have allergies.  Keep your living space clean. Use products that help remove mold and dust.  Rest as needed. Slowly return to your normal activities.  Take over-the-counter and prescription medicines only as told by your doctor. This includes oxygen therapy and inhaled medicines.  Keep all follow-up visits as told by your doctor. This is important. Contact a doctor if:  Your condition does not get better as soon as expected.  You have a hard time doing your normal activities, even after you rest.  You have new symptoms. Get help right away if:  Your shortness of breath gets worse.  You have trouble breathing when you are resting.  You feel light-headed or you pass out (faint).  You have a cough that is not helped by medicines.  You cough up blood.  You have pain with breathing.  You have pain in your chest, arms, shoulders, or belly (abdomen).  You have a fever.  You cannot walk up stairs.  You cannot exercise the way you normally do. These symptoms may represent a serious problem that is an emergency. Do not wait to see if the symptoms will go away. Get medical help right away. Call your local emergency services (911 in the [place] Do not drive yourself to the hospital. Summary  Shortness of breath is when you have trouble breathing enough air. It can be a sign of a  medical problem.  Avoid things that make it hard for you to breathe, such as smoking, pollution, mold, and dust.  Watch for any changes in your symptoms. Contact your doctor if you do not get better or you get worse. This information is not intended to replace advice given to you by your health care provider. Make sure you discuss any questions you have with your health care provider. Document Revised: 02/11/2018 Document Reviewed: 02/11/2018 Elsevier Patient Education  [place]. Montelukast oral tablets What is this medicine? MONTELUKAST (mon te LOO kast) is used to prevent and treat the symptoms of asthma. It is also used to treat allergies. Do not use for an acute asthma attack. This medicine may be used for other purposes; ask your health care provider or pharmacist if you have questions. COMMON BRAND NAME(S): Singulair What should I tell my health care provider before I take this medicine? They need to know if you have any of these conditions:  liver disease  an unusual or allergic reaction to montelukast, other medicines, foods, dyes, or preservatives  pregnant or trying to get pregnant  breast-feeding How should I use this medicine? This medicine should be given by mouth. Follow the directions on the prescription label. Take this medicine at the same time every day. You may take this medicine with or without meals. Do not chew the tablets. Do not stop taking your medicine unless your doctor tells you to. Talk to your pediatrician regarding  the use of this medicine in children. Special care may be needed. While this drug may be prescribed for children as young as 39 years of age for selected conditions, precautions do apply. Overdosage: If you think you have taken too much of this medicine contact a poison control center or emergency room at once. NOTE: This medicine is only for you. Do not share this medicine with others. What if I miss a dose? If you miss a dose, skip  it. Take your next dose at the normal time. Do not take extra or 2 doses at the same time to make up for the missed dose. What may interact with this medicine?  anti-infectives like rifampin and rifabutin  medicines for seizures like phenytoin, phenobarbital, and carbamazepine This list may not describe all possible interactions. Give your health care provider a list of all the medicines, herbs, non-prescription drugs, or dietary supplements you use. Also tell them if you smoke, drink alcohol, or use illegal drugs. Some items may interact with your medicine. What should I watch for while using this medicine? Visit your doctor or health care professional for regular checks on your progress. Tell your doctor or health care professional if your allergy or asthma symptoms do not improve. Take your medicine even when you do not have symptoms. Do not stop taking any of your medicine(s) unless your doctor tells you to. If you have asthma, talk to your doctor about what to do in an acute asthma attack. Always have your inhaled rescue medicine for asthma attacks with you. Patients and their families should watch for new or worsening thoughts of suicide or depression. Also watch for sudden changes in feelings such as feeling anxious, agitated, panicky, irritable, hostile, aggressive, impulsive, severely restless, overly excited and hyperactive, or not being able to sleep. Any worsening of mood or thoughts of suicide or dying should be reported to your health care professional right away. What side effects may I notice from receiving this medicine? Side effects that you should report to your doctor or health care professional as soon as possible:  allergic reactions like skin rash or hives, or swelling of the face, lips, or tongue  breathing problems  changes in emotions or moods  confusion  depressed mood  fever or infection  hallucinations  joint pain  painful lumps under the skin  pain,  tingling, numbness in the hands or feet  redness, blistering, peeling, or loosening of the skin, including inside the mouth  restlessness  seizures  sleep walking  signs and symptoms of infection like fever; chills; cough; sore throat; flu-like illness  signs and symptoms of liver injury like dark yellow or brown urine; general ill feeling or flu-like symptoms; light-colored stools; loss of appetite; nausea; right upper belly pain; unusually weak or tired; yellowing of the eyes or skin  sinus pain or swelling  stuttering  suicidal thoughts or other mood changes  tremors  trouble sleeping  uncontrolled muscle movements  unusual bleeding or bruising  vivid or bad dreams Side effects that usually do not require medical attention (report to your doctor or health care professional if they continue or are bothersome):  dizziness  drowsiness  headache  runny nose  stomach upset  tiredness This list may not describe all possible side effects. Call your doctor for medical advice about side effects. You may report side effects to FDA at 1-800-FDA-1088. Where should I keep my medicine? Keep out of the reach of children. Store at room temperature between  15 and 30 degrees C (59 and 86 degrees F). Protect from light and moisture. Keep this medicine in the original bottle. Throw away any unused medicine after the expiration date. NOTE: This sheet is a summary. It may not cover all possible information. If you have questions about this medicine, talk to your doctor, pharmacist, or health care provider.  2020 Elsevier/Gold Standard (2019-01-10 12:54:33) Cetirizine tablets What is this medicine? CETIRIZINE (se TI ra zeen) is an antihistamine. This medicine is used to treat or prevent symptoms of allergies. It is also used to help reduce itchy skin rash and hives. This medicine may be used for other purposes; ask your health care provider or pharmacist if you have questions. COMMON  BRAND NAME(S): All Day Allergy, Allergy Relief, Zyrtec, Zyrtec Hives Relief What should I tell my health care provider before I take this medicine? They need to know if you have any of these conditions:  kidney disease  liver disease  an unusual or allergic reaction to cetirizine, hydroxyzine, other medicines, foods, dyes, or preservatives  pregnant or trying to get pregnant  breast-feeding How should I use this medicine? Take this medicine by mouth with a glass of water. Follow the directions on the prescription label. You can take this medicine with food or on an empty stomach. Take your medicine at regular times. Do not take more often than directed. You may need to take this medicine for several days before your symptoms improve. Talk to your pediatrician regarding the use of this medicine in children. Special care may be needed. While this drug may be prescribed for children as young as 88 years of age for selected conditions, precautions do apply. Overdosage: If you think you have taken too much of this medicine contact a poison control center or emergency room at once. NOTE: This medicine is only for you. Do not share this medicine with others. What if I miss a dose? If you miss a dose, take it as soon as you can. If it is almost time for your next dose, take only that dose. Do not take double or extra doses. What may interact with this medicine?  alcohol  certain medicines for anxiety or sleep  narcotic medicines for pain  other medicines for colds or allergies This list may not describe all possible interactions. Give your health care provider a list of all the medicines, herbs, non-prescription drugs, or dietary supplements you use. Also tell them if you smoke, drink alcohol, or use illegal drugs. Some items may interact with your medicine. What should I watch for while using this medicine? Visit your doctor or health care professional for regular checks on your health. Tell  your doctor if your symptoms do not improve. You may get drowsy or dizzy. Do not drive, use machinery, or do anything that needs mental alertness until you know how this medicine affects you. Do not stand or sit up quickly, especially if you are an older patient. This reduces the risk of dizzy or fainting spells. Your mouth may get dry. Chewing sugarless gum or sucking hard candy, and drinking plenty of water may help. Contact your doctor if the problem does not go away or is severe. What side effects may I notice from receiving this medicine? Side effects that you should report to your doctor or health care professional as soon as possible:  allergic reactions like skin rash, itching or hives, swelling of the face, lips, or tongue  changes in vision or hearing  fast  or irregular heartbeat  trouble passing urine or change in the amount of urine Side effects that usually do not require medical attention (report to your doctor or health care professional if they continue or are bothersome):  dizziness  dry mouth  irritability  sore throat  stomach pain  tiredness This list may not describe all possible side effects. Call your doctor for medical advice about side effects. You may report side effects to FDA at 1-800-FDA-1088. Where should I keep my medicine? Keep out of the reach of children. Store at room temperature between 15 and 30 degrees C (59 and 86 degrees F). Throw away any unused medicine after the expiration date. NOTE: This sheet is a summary. It may not cover all possible information. If you have questions about this medicine, talk to your doctor, pharmacist, or health care provider.  2020 Elsevier/Gold Standard (2014-10-06 13:44:42)

## 2020-01-19 NOTE — Progress Notes (Addendum)
Patient [facility]    Established Patient Office Visit  Subjective:  Patient ID: Sharon Swanson, female    DOB: 1961/06/21  Age: 59 y.o. MRN: [id number]  CC:  Chief Complaint  Patient presents with  . Follow-up    DM    HPI Sharon Swanson is a 59 year old female who presents for Follow Up today.   Past Medical History:  Diagnosis Date  . Acid reflux   . Asthma   . COPD (chronic obstructive pulmonary disease) ([place])   . Diabetes ([place])   . Diabetes mellitus without complication ([place])    Type II  . Fatty liver 11/08/2011  . Fibroids 11/08/2011  . Hyperglycemia   . Hypertension   . Obesity (BMI 30-39.9) 11/08/2011  . Osteoarthritis of right acromioclavicular joint   . Pancreatitis 11/08/2011  . Rotator cuff tear, right   . Sleep apnea    Can not afford Cpap machine  . Ventral hernia    Current Status: Since her last office visit, she has c/o shortness of breath on exertion lately, which she is currently on 3 liters of oxygen a day. She has been having increased fluid in her legs. She continues to smoke 2 cigarettes a day. She has seen low range of 112 and high of 216 since his last office visit. She denies fatigue, frequent urination, blurred vision, excessive hunger, excessive thirst, weight gain, weight loss, and poor wound healing. She continues to check her feet regularly. She denies visual changes, chest pain, cough, shortness of breath, heart palpitations, and falls. She has occasional headaches and dizziness with position changes. Denies severe headaches, confusion, seizures, double vision, and blurred vision, nausea and vomiting. Her anxiety is stable. She denies suicidal ideations, homicidal ideations, or auditory hallucinations. She denies fevers, chills, recent infections, weight loss, and night sweats. Denies GI problems such as nausea, vomiting, diarrhea, and constipation. She has no reports of blood in stools, dysuria and  hematuria. She is taking all medications as prescribed. She denies pain today.   Past Surgical History:  Procedure Laterality Date  . ORTHOPEDIC SURGERY     R ankle, tendonitis    Family History  Problem Relation Age of Onset  . Hypertension Mother   . Diabetes Other     Social History   Socioeconomic History  . Marital status: Single    Spouse name: Not on file  . Number of children: Not on file  . Years of education: Not on file  . Highest education level: Not on file  Occupational History  . Not on file  Tobacco Use  . Smoking status: Current Every Day Smoker    Packs/day: 0.50    Types: Cigarettes  . Smokeless tobacco: Never Used  Substance and Sexual Activity  . Alcohol use: No  . Drug use: No  . Sexual activity: Not Currently  Other Topics Concern  . Not on file  Social History Narrative  . Not on file   Social Determinants of Health   Financial Resource Strain:   . Difficulty of Paying Living Expenses:   Food Insecurity:   . Worried About Charity fundraiser in the Last Year:   . Arboriculturist in the Last Year:   Transportation Needs:   . Film/video editor (Medical):   Marland Kitchen Lack of Transportation (Non-Medical):   Physical Activity:   . Days of Exercise per Week:   . Minutes of Exercise per Session:  Stress:   . Feeling of Stress :   Social Connections:   . Frequency of Communication with Friends and Family:   . Frequency of Social Gatherings with Friends and Family:   . Attends Religious Services:   . Active Member of Clubs or Organizations:   . Attends Archivist Meetings:   Marland Kitchen Marital Status:   Intimate Partner Violence:   . Fear of Current or Ex-Partner:   . Emotionally Abused:   Marland Kitchen Physically Abused:   . Sexually Abused:     Outpatient Medications Prior to Visit  Medication Sig Dispense Refill  . albuterol (VENTOLIN HFA) 108 (90 Base) MCG/ACT inhaler Inhale 2 puffs into the lungs every 4 (four) hours as needed for wheezing  or shortness of breath. 8 g 12  . carvedilol (COREG) 12.5 MG tablet Take 1 tablet (12.5 mg total) by mouth 2 (two) times daily with a meal. 60 tablet 3  . docusate sodium (COLACE) 100 MG capsule Take 1 capsule (100 mg total) by mouth 3 (three) times daily as needed. 30 capsule 3  . fluticasone (FLONASE) 50 MCG/ACT nasal spray Place 1 spray into both nostrils daily. 16 g 3  . gabapentin (NEURONTIN) 300 MG capsule Take 1 capsule (300 mg total) by mouth 3 (three) times daily. 90 capsule 3  . glucose monitoring kit (FREESTYLE) monitoring kit 1 each by Does not apply route as needed for other. 1 each 0  . insulin aspart (NOVOLOG) 100 UNIT/ML injection Inject 0-5 Units into the skin at bedtime. 10 mL 11  . insulin aspart protamine- aspart (NOVOLOG MIX 70/30) (70-30) 100 UNIT/ML injection Inject 0.2 mLs (20 Units total) into the skin 2 (two) times daily with a meal. 10 mL 12  . ipratropium-albuterol (DUONEB) 0.5-2.5 (3) MG/3ML SOLN Take 3 mLs by nebulization every 6 (six) hours as needed (shortness of breath, wheeze). 360 mL 12  . losartan (COZAAR) 50 MG tablet Take 1 tablet (50 mg total) by mouth daily. 30 tablet 3  . Misc. Devices (BARIATRIC ROLLATOR) MISC 1 Device by Does not apply route daily. 1 each 0  . pantoprazole (PROTONIX) 40 MG tablet Take 1 tablet (40 mg total) by mouth 2 (two) times daily before a meal. 60 tablet 3  . varenicline (CHANTIX) 0.5 MG tablet Take 1 tablet (0.5 mg total) by mouth 2 (two) times daily. 60 tablet 3  . furosemide (LASIX) 20 MG tablet TAKE 3 TABLETS (60 MG TOTAL) BY MOUTH DAILY. 90 tablet 3  . methocarbamol (ROBAXIN) 500 MG tablet TAKE 1 TABLET (500 MG TOTAL) BY MOUTH 3 (THREE) TIMES DAILY. 30 tablet 3   No facility-administered medications prior to visit.    Allergies  Allergen Reactions  . Aspirin Other (See Comments)    Abdominal pain    ROS Review of Systems  Constitutional: Negative.   Eyes: Negative.   Respiratory: Positive for shortness of breath (on  exertion) and wheezing.   Cardiovascular: Negative.   Gastrointestinal: Negative.   Genitourinary: Negative.   Musculoskeletal: Positive for arthralgias (generalized joint pain. ).  Skin: Negative.   Allergic/Immunologic: Negative.   Neurological: Positive for dizziness (occasional ) and headaches (occasional ).  Hematological: Negative.   Psychiatric/Behavioral: The patient is nervous/anxious.    Objective:    Physical Exam  Constitutional: She is oriented to person, place, and time. She appears well-developed and well-nourished.  HENT:  Head: Normocephalic and atraumatic.  Eyes: Conjunctivae are normal.  Cardiovascular: Normal rate, regular rhythm, normal heart sounds and intact  distal pulses.  Pulmonary/Chest: Effort normal. She has wheezes (all 4 lung lobes).  Abdominal: Soft. Bowel sounds are normal. She exhibits distension (obese).  Musculoskeletal:        General: Normal range of motion.     Cervical back: Normal range of motion and neck supple.  Neurological: She is alert and oriented to person, place, and time. She has normal reflexes.  Skin: Skin is warm and dry.  Psychiatric: She has a normal mood and affect. Her behavior is normal. Judgment and thought content normal.  Nursing note and vitals reviewed.   BP (!) 148/90   Pulse 88   Temp 97.8 F (36.6 C)   Ht 5' 7"  (1.702 m)   Wt (!) 313 lb 12.8 oz (142.3 kg)   SpO2 97% Comment: 3lt liters for O2  BMI 49.15 kg/m  Wt Readings from Last 3 Encounters:  01/19/20 (!) 313 lb 12.8 oz (142.3 kg)  10/17/19 (!) 307 lb (139.3 kg)  09/16/19 (!) 311 lb 12.8 oz (141.4 kg)     Health Maintenance Due  Topic Date Due  . Hepatitis C Screening  Never done  . FOOT EXAM  Never done  . OPHTHALMOLOGY EXAM  Never done  . URINE MICROALBUMIN  Never done  . COVID-19 Vaccine (1) Never done  . TETANUS/TDAP  Never done  . PAP SMEAR-Modifier  Never done  . MAMMOGRAM  Never done    There are no preventive care reminders to display  for this patient.  Lab Results  Component Value Date   TSH 0.491 08/17/2014   Lab Results  Component Value Date   WBC 4.8 09/16/2019   HGB 15.6 09/16/2019   HCT 46.6 09/16/2019   MCV 84 09/16/2019   PLT 198 09/16/2019   Lab Results  Component Value Date   NA 141 09/16/2019   K 4.5 09/16/2019   CO2 22 09/16/2019   GLUCOSE 170 (H) 09/16/2019   BUN 13 09/16/2019   CREATININE 0.59 09/16/2019   BILITOT 0.3 09/16/2019   ALKPHOS 70 09/16/2019   AST 15 09/16/2019   ALT 18 09/16/2019   PROT 6.9 09/16/2019   ALBUMIN 4.1 09/16/2019   CALCIUM 9.8 09/16/2019   ANIONGAP 10 08/12/2019   Lab Results  Component Value Date   CHOL 197 11/07/2011   Lab Results  Component Value Date   HDL 70 11/07/2011   Lab Results  Component Value Date   LDLCALC 107 (H) 11/07/2011   Lab Results  Component Value Date   TRIG 99 11/07/2011   Lab Results  Component Value Date   CHOLHDL 2.8 11/07/2011   Lab Results  Component Value Date   HGBA1C 8.0 (A) 01/19/2020      Assessment & Plan:   1. Type 2 diabetes mellitus with hyperglycemia, with long-term current use of insulin (HCC) She will continue medication as prescribed, to decrease foods/beverages high in sugars and carbs and follow Heart Healthy or DASH diet. Increase physical activity to at least 30 minutes cardio exercise daily.  - glycosylated hemoglobin (Hb A1C) - POCT urinalysis dipstick - POCT glucose (manual entry)  2. Hemoglobin A1C between 7% and 9% indicating borderline diabetic control Hgb A1c at 8.0 today. Monitor.   3. Hyperglycemia  4. Proteinuria, unspecified type - Microalbumin/Creatinine Ratio, Urine  5. Essential hypertension The current medical regimen is effective; blood pressure is stable at 148/90 today; continue present plan and medications as prescribed. She denies visual changes, chest pain, cough, shortness of breath, heart palpitations, and  falls. She has occasional headaches and dizziness with position  changes. Denies severe headaches, confusion, seizures, double vision, and blurred vision, nausea and vomiting.  6. Bilateral lower extremity edema We will increase Lasix to 40 mg BID.  - furosemide (LASIX) 40 MG tablet; Take 1 tablet (40 mg total) by mouth 2 (two) times daily.  Dispense: 60 tablet; Refill: 1  7. Weight gain  8. Moderate asthma without complication, unspecified whether persistent  9. Shortness of breath on exertion - cetirizine (ZYRTEC) 10 MG tablet; Take 1 tablet (10 mg total) by mouth in the morning.  Dispense: 30 tablet; Refill: 11 - montelukast (SINGULAIR) 10 MG tablet; Take 1 tablet (10 mg total) by mouth at bedtime.  Dispense: 30 tablet; Refill: 3  10. Wheezes She will use nebulizer treatments up to 4 times a day as needed.   11. Seasonal allergies We will initiate Cetirizine daily.  - cetirizine (ZYRTEC) 10 MG tablet; Take 1 tablet (10 mg total) by mouth in the morning.  Dispense: 30 tablet; Refill: 11 - montelukast (SINGULAIR) 10 MG tablet; Take 1 tablet (10 mg total) by mouth at bedtime.  Dispense: 30 tablet; Refill: 3  12. OSA (obstructive sleep apnea) Stable. Continue nightly CPAP use.   13. Abdominal pain, acute, periumbilical  14. Follow up She will follow up in 1 months.   Meds ordered this encounter  Medications  . methocarbamol (ROBAXIN) 500 MG tablet    Sig: Take 1 tablet (500 mg total) by mouth 3 (three) times daily.    Dispense:  90 tablet    Refill:  3  . furosemide (LASIX) 40 MG tablet    Sig: Take 1 tablet (40 mg total) by mouth 2 (two) times daily.    Dispense:  60 tablet    Refill:  1  . cetirizine (ZYRTEC) 10 MG tablet    Sig: Take 1 tablet (10 mg total) by mouth in the morning.    Dispense:  30 tablet    Refill:  11  . montelukast (SINGULAIR) 10 MG tablet    Sig: Take 1 tablet (10 mg total) by mouth at bedtime.    Dispense:  30 tablet    Refill:  3   Orders Placed This Encounter  Procedures  . Microalbumin/Creatinine Ratio,  Urine  . POCT glycosylated hemoglobin (Hb A1C)  . POCT urinalysis dipstick  . POCT glucose (manual entry)    Referral Orders  No referral(s) requested today    Kathe Becton,  MSN, FNP-BC [address] [place], [place] [address] [phone] fax   Problem List Items Addressed This Visit      Cardiovascular and Mediastinum   Essential hypertension   Relevant Medications   furosemide (LASIX) 40 MG tablet     Respiratory   OSA (obstructive sleep apnea)     Endocrine   Hemoglobin A1C greater than 9%, indicating poor diabetic control     Other   Abdominal pain, acute, periumbilical    Other Visit Diagnoses    Type 2 diabetes mellitus with hyperglycemia, with long-term current use of insulin (HCC)    -  Primary   Relevant Orders   POCT glycosylated hemoglobin (Hb A1C) (Completed)   POCT urinalysis dipstick (Completed)   POCT glucose (manual entry) (Completed)   Hyperglycemia       Proteinuria, unspecified type       Relevant Orders   Microalbumin/Creatinine Ratio, Urine   Bilateral  lower extremity edema       Relevant Medications   furosemide (LASIX) 40 MG tablet   Weight gain       Moderate asthma without complication, unspecified whether persistent       Relevant Medications   montelukast (SINGULAIR) 10 MG tablet   Shortness of breath on exertion       Relevant Medications   cetirizine (ZYRTEC) 10 MG tablet   montelukast (SINGULAIR) 10 MG tablet   Wheezes       Seasonal allergies       Relevant Medications   cetirizine (ZYRTEC) 10 MG tablet   montelukast (SINGULAIR) 10 MG tablet   Follow up          Meds ordered this encounter  Medications  . methocarbamol (ROBAXIN) 500 MG tablet    Sig: Take 1 tablet (500 mg total) by mouth 3 (three) times daily.    Dispense:  90 tablet    Refill:  3  . furosemide (LASIX) 40 MG tablet    Sig: Take 1 tablet (40 mg total)  by mouth 2 (two) times daily.    Dispense:  60 tablet    Refill:  1  . cetirizine (ZYRTEC) 10 MG tablet    Sig: Take 1 tablet (10 mg total) by mouth in the morning.    Dispense:  30 tablet    Refill:  11  . montelukast (SINGULAIR) 10 MG tablet    Sig: Take 1 tablet (10 mg total) by mouth at bedtime.    Dispense:  30 tablet    Refill:  3    Follow-up: Return in about 1 month (around 02/18/2020).    Azzie Glatter, FNP

## 2020-01-20 LAB — MICROALBUMIN / CREATININE URINE RATIO
Creatinine, Urine: 40.8 mg/dL
Microalb/Creat Ratio: 878 mg/g creat — ABNORMAL HIGH (ref 0–29)
Microalbumin, Urine: 358.2 ug/mL

## 2020-01-24 DIAGNOSIS — R600 Localized edema: Secondary | ICD-10-CM

## 2020-01-24 DIAGNOSIS — R809 Proteinuria, unspecified: Secondary | ICD-10-CM

## 2020-01-24 DIAGNOSIS — R0602 Shortness of breath: Secondary | ICD-10-CM

## 2020-01-24 HISTORY — DX: Shortness of breath: R06.02

## 2020-01-24 HISTORY — DX: Proteinuria, unspecified: R80.9

## 2020-01-24 HISTORY — DX: Localized edema: R60.0

## 2020-02-02 ENCOUNTER — Other Ambulatory Visit: Payer: Self-pay | Admitting: Family Medicine

## 2020-02-02 ENCOUNTER — Encounter: Payer: Self-pay | Admitting: Family Medicine

## 2020-02-02 DIAGNOSIS — R809 Proteinuria, unspecified: Secondary | ICD-10-CM

## 2020-02-18 ENCOUNTER — Encounter: Payer: Self-pay | Admitting: Family Medicine

## 2020-02-18 ENCOUNTER — Ambulatory Visit (INDEPENDENT_AMBULATORY_CARE_PROVIDER_SITE_OTHER): Payer: Worker's Compensation | Admitting: Family Medicine

## 2020-02-18 ENCOUNTER — Other Ambulatory Visit: Payer: Self-pay

## 2020-02-18 VITALS — BP 183/88 | HR 91 | Temp 98.4°F | Ht 67.0 in | Wt 315.4 lb

## 2020-02-18 DIAGNOSIS — E1165 Type 2 diabetes mellitus with hyperglycemia: Secondary | ICD-10-CM

## 2020-02-18 DIAGNOSIS — E119 Type 2 diabetes mellitus without complications: Secondary | ICD-10-CM

## 2020-02-18 DIAGNOSIS — R739 Hyperglycemia, unspecified: Secondary | ICD-10-CM

## 2020-02-18 DIAGNOSIS — Z09 Encounter for follow-up examination after completed treatment for conditions other than malignant neoplasm: Secondary | ICD-10-CM

## 2020-02-18 DIAGNOSIS — R0602 Shortness of breath: Secondary | ICD-10-CM

## 2020-02-18 DIAGNOSIS — R7303 Prediabetes: Secondary | ICD-10-CM

## 2020-02-18 DIAGNOSIS — R600 Localized edema: Secondary | ICD-10-CM

## 2020-02-18 DIAGNOSIS — I10 Essential (primary) hypertension: Secondary | ICD-10-CM

## 2020-02-18 DIAGNOSIS — Z794 Long term (current) use of insulin: Secondary | ICD-10-CM

## 2020-02-18 DIAGNOSIS — Z9981 Dependence on supplemental oxygen: Secondary | ICD-10-CM

## 2020-02-18 LAB — POCT URINALYSIS DIPSTICK
Bilirubin, UA: NEGATIVE
Blood, UA: NEGATIVE
Glucose, UA: NEGATIVE
Ketones, UA: NEGATIVE
Leukocytes, UA: NEGATIVE
Nitrite, UA: NEGATIVE
Protein, UA: POSITIVE — AB
Spec Grav, UA: 1.025 (ref 1.010–1.025)
Urobilinogen, UA: 1 E.U./dL
pH, UA: 7 (ref 5.0–8.0)

## 2020-02-18 LAB — GLUCOSE, POCT (MANUAL RESULT ENTRY): POC Glucose: 150 mg/dl — AB (ref 70–99)

## 2020-02-18 MED ORDER — METHOCARBAMOL 750 MG PO TABS: 750.0000 mg | ORAL_TABLET | Freq: Three times a day (TID) | ORAL | 3 refills | Status: DC

## 2020-02-18 MED FILL — METHOCARBAMOL 750 MG TABS: 750 | 30 days supply | Qty: 90 | Fill #0

## 2020-02-18 NOTE — Progress Notes (Signed)
Patient [facility]   Established Patient Office Visit  Subjective:  Patient ID: Sharon Swanson, female    DOB: 1961-01-31  Age: 59 y.o. MRN: [id number]  CC:  Chief Complaint  Patient presents with  . Follow-up    DM    HPI Sharon Swanson is a 59 year old female wh presents for Follow Up today.   Past Medical History:  Diagnosis Date  . Acid reflux   . Asthma   . COPD (chronic obstructive pulmonary disease) ([place])   . Diabetes ([place])   . Diabetes mellitus without complication ([place])    Type II  . Fatty liver 11/08/2011  . Fibroids 11/08/2011  . Hyperglycemia   . Hypertension   . Microalbuminuria 01/2020  . Obesity (BMI 30-39.9) 11/08/2011  . Osteoarthritis of right acromioclavicular joint   . Pancreatitis 11/08/2011  . Rotator cuff tear, right   . Sleep apnea    Can not afford Cpap machine  . Ventral hernia    Patient Active Problem List   Diagnosis Date Noted  . Abdominal pain, acute, periumbilical 54/49/2010  . Hemoglobin A1C greater than 9%, indicating poor diabetic control 10/19/2019  . Class 3 severe obesity due to excess calories with serious comorbidity and body mass index (BMI) of 45.0 to 49.9 in adult [facility]) 09/21/2019  . Hemoglobin A1C between 7% and 9% indicating borderline diabetic control 09/21/2019  . Acute respiratory failure with hypoxia ([place]) 08/02/2019  . Acute CHF (congestive heart failure) ([place]) 08/02/2019  . Cellulitis 08/02/2019  . Insulin-requiring or dependent type II diabetes mellitus ([place]) 01/19/2017  . Essential hypertension 01/19/2017  . Anxiety 01/19/2017  . Chest pain 01/19/2017  . OSA (obstructive sleep apnea) 01/19/2017  . COPD (chronic obstructive pulmonary disease) ([place])   . Acute respiratory failure ([place]) 08/17/2014  . Umbilical hernia 04/05/1974  . Pulmonary nodule, left 08/17/2014  . Constipation, chronic 08/15/2012  . GERD (gastroesophageal reflux disease) 11/08/2011  . Tobacco abuse  11/08/2011  . Fatty liver 11/08/2011  . BMI 45.0-49.9, adult ([place]) 11/08/2011  . Fibroids 11/08/2011    Current Status: Since her last office visit, she states that she is doing well with no complaints. Her blood pressures are elevated today. She states that she is taking hypertensive medications as prescribed. Microalbumin/Creatinin ratio is elevated at 878, which patient was referred to Nephrology. Patient has not followed up with Nephrologist as advised. She denies visual changes, chest pain, cough, shortness of breath, heart palpitations, and falls. She has occasional headaches and dizziness with position changes. Denies severe headaches, confusion, seizures, double vision, and blurred vision, nausea and vomiting. She has not been monitoring her blood glucose levels regularly. She denies fatigue, frequent urination, excessive hunger, excessive thirst, weight gain, weight loss, and poor wound healing. She continues to check her feet regularly. She denies fevers, chills, recent infections, weight loss, and night sweats. Denies GI problems such as diarrhea, and constipation. She has no reports of blood in stools, dysuria and hematuria. No depression or anxiety reported today She denies suicidal ideations, homicidal ideations, or auditory hallucinations. She is taking all medications as prescribed. She has moderated pain today.   Past Surgical History:  Procedure Laterality Date  . ORTHOPEDIC SURGERY     R ankle, tendonitis    Family History  Problem Relation Age of Onset  . Hypertension Mother   . Diabetes Other     Social History   Socioeconomic History  . Marital status: Single  Spouse name: Not on file  . Number of children: Not on file  . Years of education: Not on file  . Highest education level: Not on file  Occupational History  . Not on file  Tobacco Use  . Smoking status: Current Every Day Smoker    Packs/day: 0.50    Types: Cigarettes  . Smokeless tobacco: Never Used    Substance and Sexual Activity  . Alcohol use: No  . Drug use: No  . Sexual activity: Not Currently  Other Topics Concern  . Not on file  Social History Narrative  . Not on file   Social Determinants of Health   Financial Resource Strain:   . Difficulty of Paying Living Expenses:   Food Insecurity:   . Worried About Charity fundraiser in the Last Year:   . Arboriculturist in the Last Year:   Transportation Needs:   . Film/video editor (Medical):   Marland Kitchen Lack of Transportation (Non-Medical):   Physical Activity:   . Days of Exercise per Week:   . Minutes of Exercise per Session:   Stress:   . Feeling of Stress :   Social Connections:   . Frequency of Communication with Friends and Family:   . Frequency of Social Gatherings with Friends and Family:   . Attends Religious Services:   . Active Member of Clubs or Organizations:   . Attends Archivist Meetings:   Marland Kitchen Marital Status:   Intimate Partner Violence:   . Fear of Current or Ex-Partner:   . Emotionally Abused:   Marland Kitchen Physically Abused:   . Sexually Abused:     Outpatient Medications Prior to Visit  Medication Sig Dispense Refill  . albuterol (VENTOLIN HFA) 108 (90 Base) MCG/ACT inhaler Inhale 2 puffs into the lungs every 4 (four) hours as needed for wheezing or shortness of breath. 8 g 12  . carvedilol (COREG) 12.5 MG tablet Take 1 tablet (12.5 mg total) by mouth 2 (two) times daily with a meal. 60 tablet 3  . cetirizine (ZYRTEC) 10 MG tablet Take 1 tablet (10 mg total) by mouth in the morning. 30 tablet 11  . docusate sodium (COLACE) 100 MG capsule Take 1 capsule (100 mg total) by mouth 3 (three) times daily as needed. 30 capsule 3  . fluticasone (FLONASE) 50 MCG/ACT nasal spray Place 1 spray into both nostrils daily. 16 g 3  . furosemide (LASIX) 40 MG tablet Take 1 tablet (40 mg total) by mouth 2 (two) times daily. 60 tablet 1  . gabapentin (NEURONTIN) 300 MG capsule Take 1 capsule (300 mg total) by mouth 3  (three) times daily. 90 capsule 3  . glucose monitoring kit (FREESTYLE) monitoring kit 1 each by Does not apply route as needed for other. 1 each 0  . insulin aspart (NOVOLOG) 100 UNIT/ML injection Inject 0-5 Units into the skin at bedtime. 10 mL 11  . insulin aspart protamine- aspart (NOVOLOG MIX 70/30) (70-30) 100 UNIT/ML injection Inject 0.2 mLs (20 Units total) into the skin 2 (two) times daily with a meal. 10 mL 12  . ipratropium-albuterol (DUONEB) 0.5-2.5 (3) MG/3ML SOLN Take 3 mLs by nebulization every 6 (six) hours as needed (shortness of breath, wheeze). 360 mL 12  . losartan (COZAAR) 50 MG tablet TAKE 1 TABLET (50 MG TOTAL) BY MOUTH DAILY. 30 tablet 3  . methocarbamol (ROBAXIN) 500 MG tablet Take 1 tablet (500 mg total) by mouth 3 (three) times daily. 90 tablet 3  .  Misc. Devices (BARIATRIC ROLLATOR) MISC 1 Device by Does not apply route daily. 1 each 0  . montelukast (SINGULAIR) 10 MG tablet Take 1 tablet (10 mg total) by mouth at bedtime. 30 tablet 3  . pantoprazole (PROTONIX) 40 MG tablet Take 1 tablet (40 mg total) by mouth 2 (two) times daily before a meal. 60 tablet 3  . varenicline (CHANTIX) 0.5 MG tablet Take 1 tablet (0.5 mg total) by mouth 2 (two) times daily. 60 tablet 3   No facility-administered medications prior to visit.    Allergies  Allergen Reactions  . Aspirin Other (See Comments)    Abdominal pain    ROS Review of Systems  Constitutional: Negative.   HENT: Negative.   Eyes: Negative.   Respiratory: Positive for shortness of breath (occasional ).   Cardiovascular: Negative.   Gastrointestinal: Positive for abdominal distention.  Endocrine: Negative.   Genitourinary: Negative.   Musculoskeletal: Positive for arthralgias (generalized joint pain) and back pain (chronic ).  Skin: Negative.   Allergic/Immunologic: Negative.   Neurological: Positive for dizziness (occasional ), weakness (generalized. ) and headaches (occasional ).  Hematological: Negative.     Psychiatric/Behavioral: Positive for sleep disturbance (Sleep Apnea; CPAP use).      Objective:    Physical Exam  Constitutional: She is oriented to person, place, and time. She appears well-developed and well-nourished.  Rollator   HENT:  Head: Normocephalic and atraumatic.  Eyes: Conjunctivae are normal.  Cardiovascular: Normal rate, regular rhythm, normal heart sounds and intact distal pulses.  Pulmonary/Chest: Effort normal and breath sounds normal.  Abdominal: Soft. Bowel sounds are normal. She exhibits distension (obese).  Musculoskeletal:        General: Normal range of motion.     Cervical back: Normal range of motion and neck supple.     Comments: Use of wheelchair   Neurological: She is alert and oriented to person, place, and time.  Skin: Skin is warm and dry.  Psychiatric: She has a normal mood and affect. Her behavior is normal. Judgment and thought content normal.  Nursing note and vitals reviewed.   Ht 5' 7" (1.702 m)   Wt (!) 315 lb 6.4 oz (143.1 kg)   BMI 49.40 kg/m  Wt Readings from Last 3 Encounters:  02/18/20 (!) 315 lb 6.4 oz (143.1 kg)  01/19/20 (!) 313 lb 12.8 oz (142.3 kg)  10/17/19 (!) 307 lb (139.3 kg)     Health Maintenance Due  Topic Date Due  . Hepatitis C Screening  Never done  . FOOT EXAM  Never done  . OPHTHALMOLOGY EXAM  Never done  . COVID-19 Vaccine (1) Never done  . TETANUS/TDAP  Never done  . PAP SMEAR-Modifier  Never done  . MAMMOGRAM  Never done    There are no preventive care reminders to display for this patient.  Lab Results  Component Value Date   TSH 0.491 08/17/2014   Lab Results  Component Value Date   WBC 4.8 09/16/2019   HGB 15.6 09/16/2019   HCT 46.6 09/16/2019   MCV 84 09/16/2019   PLT 198 09/16/2019   Lab Results  Component Value Date   NA 141 09/16/2019   K 4.5 09/16/2019   CO2 22 09/16/2019   GLUCOSE 170 (H) 09/16/2019   BUN 13 09/16/2019   CREATININE 0.59 09/16/2019   BILITOT 0.3 09/16/2019    ALKPHOS 70 09/16/2019   AST 15 09/16/2019   ALT 18 09/16/2019   PROT 6.9 09/16/2019   ALBUMIN 4.1  09/16/2019   CALCIUM 9.8 09/16/2019   ANIONGAP 10 08/12/2019   Lab Results  Component Value Date   CHOL 197 11/07/2011   Lab Results  Component Value Date   HDL 70 11/07/2011   Lab Results  Component Value Date   LDLCALC 107 (H) 11/07/2011   Lab Results  Component Value Date   TRIG 99 11/07/2011   Lab Results  Component Value Date   CHOLHDL 2.8 11/07/2011   Lab Results  Component Value Date   HGBA1C 8.0 (A) 01/19/2020    Assessment & Plan:   1. Type 2 diabetes mellitus with hyperglycemia, with long-term current use of insulin (HCC) She will continue medication as prescribed, to decrease foods/beverages high in sugars and carbs and follow Heart Healthy or DASH diet. Increase physical activity to at least 30 minutes cardio exercise daily.  - POCT urinalysis dipstick - POCT glucose (manual entry)  2. Hemoglobin A1C between 7% and 9% indicating borderline diabetic control Hgb A1c decreased at 8.0, from 9.2. monitor.   3. Hyperglycemia  4. Elevated systolic blood pressure reading with diagnosis of hypertension Systolic blood pressure continues to be elevated today. She will report to ED if she eperiences severe headaches, confusion, seizures, double vision, and blurred vision, nausea and vomiting. She will continue to take hypertensive medications as prescribed. Follow up with Nephrology, [facility].  5. Essential hypertension  6. Shortness of breath on exertion Follow up with [facility], Dr. Vaughan Browner. Continue oxygen as needed.   7. Bilateral lower extremity edema Stable. Not worsening.   8. Dependence on continuous supplemental oxygen  9. Follow up She will follow up in 1 month.    Problem List Items Addressed This Visit      Cardiovascular and Mediastinum   Essential hypertension     Endocrine   Hemoglobin A1C between 7% and 9% indicating  borderline diabetic control    Other Visit Diagnoses    Type 2 diabetes mellitus with hyperglycemia, with long-term current use of insulin (HCC)    -  Primary   Relevant Orders   POCT urinalysis dipstick   POCT glucose (manual entry) (Completed)   Hyperglycemia       Follow up          No orders of the defined types were placed in this encounter.   Follow-up: No follow-ups on file.    Azzie Glatter, FNP

## 2020-02-24 ENCOUNTER — Other Ambulatory Visit: Payer: Self-pay | Admitting: Family Medicine

## 2020-02-24 MED FILL — FUROSEMIDE 40 MG TAB: 40 | 30 days supply | Qty: 60 | Fill #1

## 2020-02-24 NOTE — Telephone Encounter (Signed)
Gabapentin refill

## 2020-02-25 ENCOUNTER — Other Ambulatory Visit: Payer: Self-pay

## 2020-02-25 ENCOUNTER — Encounter: Payer: Self-pay | Admitting: Family Medicine

## 2020-02-25 ENCOUNTER — Ambulatory Visit (INDEPENDENT_AMBULATORY_CARE_PROVIDER_SITE_OTHER): Payer: Self-pay | Admitting: Family Medicine

## 2020-02-25 VITALS — BP 153/78 | HR 91 | Temp 97.9°F | Ht 67.0 in | Wt 314.2 lb

## 2020-02-25 DIAGNOSIS — I10 Essential (primary) hypertension: Secondary | ICD-10-CM

## 2020-02-25 DIAGNOSIS — R600 Localized edema: Secondary | ICD-10-CM | POA: Insufficient documentation

## 2020-02-25 DIAGNOSIS — R7303 Prediabetes: Secondary | ICD-10-CM

## 2020-02-25 DIAGNOSIS — Z09 Encounter for follow-up examination after completed treatment for conditions other than malignant neoplasm: Secondary | ICD-10-CM

## 2020-02-25 DIAGNOSIS — R0602 Shortness of breath: Secondary | ICD-10-CM | POA: Insufficient documentation

## 2020-02-25 DIAGNOSIS — Z794 Long term (current) use of insulin: Secondary | ICD-10-CM

## 2020-02-25 DIAGNOSIS — Z9981 Dependence on supplemental oxygen: Secondary | ICD-10-CM | POA: Insufficient documentation

## 2020-02-25 DIAGNOSIS — E119 Type 2 diabetes mellitus without complications: Secondary | ICD-10-CM

## 2020-02-25 DIAGNOSIS — R739 Hyperglycemia, unspecified: Secondary | ICD-10-CM

## 2020-02-25 DIAGNOSIS — E1165 Type 2 diabetes mellitus with hyperglycemia: Secondary | ICD-10-CM

## 2020-02-25 MED ORDER — SPIRONOLACTONE 25 MG PO TABS: 25.0000 mg | ORAL_TABLET | Freq: Every day | ORAL | 1 refills | Status: DC

## 2020-02-25 MED ORDER — PANTOPRAZOLE SODIUM 40 MG PO TBEC: 40.0000 mg | DELAYED_RELEASE_TABLET | Freq: Two times a day (BID) | ORAL | 6 refills | Status: DC

## 2020-02-25 MED ORDER — LOSARTAN POTASSIUM 50 MG PO TABS: 50.0000 mg | ORAL_TABLET | Freq: Every day | ORAL | 6 refills | Status: DC

## 2020-02-25 MED ORDER — FUROSEMIDE 40 MG PO TABS: 40.0000 mg | ORAL_TABLET | Freq: Two times a day (BID) | ORAL | 6 refills | Status: DC

## 2020-02-25 MED FILL — ?SPIRONOLACTONE 25 MG TABLE: 25 | 30 days supply | Qty: 30 | Fill #0

## 2020-02-25 MED FILL — ?PANTOPRAZOLE SO DR 40MG TA: 40 | 30 days supply | Qty: 60 | Fill #0

## 2020-02-25 MED FILL — GABAPENTIN 300 MG CAPSULE: 300 | 30 days supply | Qty: 90 | Fill #0

## 2020-02-25 NOTE — Progress Notes (Signed)
Patient [facility]    Established Patient Office Visit  Subjective:  Patient ID: Sharon Swanson, female    DOB: 1961-04-25  Age: 59 y.o. MRN: [id number]  CC:  Chief Complaint  Patient presents with  . Follow-up    2 week f/u  . Leg Swelling  . Dizziness    HPI Sharon Swanson is a 59 year old female who presents for Follow Up today.   Past Medical History:  Diagnosis Date  . Acid reflux   . Asthma   . COPD (chronic obstructive pulmonary disease) ([place])   . CPAP (continuous positive airway pressure) dependence   . Diabetes ([place])   . Diabetes mellitus without complication ([place])    Type II  . Fatty liver 11/08/2011  . Fibroids 11/08/2011  . Hyperglycemia   . Hypertension   . Microalbuminuria 01/2020  . Obesity (BMI 30-39.9) 11/08/2011  . Osteoarthritis of right acromioclavicular joint   . Pancreatitis 11/08/2011  . Rotator cuff tear, right   . Sleep apnea    Can not afford Cpap machine  . Ventral hernia    Current Status: Since her last office visit, she is doing well with no complaints. She continues to have dyspnea, and has appointment with Dr. Warner Mccreedy with [facility] on 03/08/2020. She is currently on 3 Liters of oxygen at this time. She has not been able to schedule with Nephrology as of yet, she is awaiting a phone call back to schedule. She denies visual changes, chest pain, cough, shortness of breath, heart palpitations, and falls. She has occasional headaches and dizziness with position changes. Denies severe headaches, confusion, seizures, double vision, and blurred vision, nausea and vomiting. She denies fatigue, frequent urination, blurred vision, excessive hunger, excessive thirst, weight gain, weight loss, and poor wound healing. She continues to check her feet regularly.  She denies fevers, chills, recent infections, weight loss, and night sweats. Denies GI problems such as diarrhea, and constipation. She has no reports of blood in  stools, dysuria and hematuria. No depression or anxiety reported today. She is taking all medications as prescribed.  Past Surgical History:  Procedure Laterality Date  . ORTHOPEDIC SURGERY     R ankle, tendonitis    Family History  Problem Relation Age of Onset  . Hypertension Mother   . Diabetes Other     Social History   Socioeconomic History  . Marital status: Single    Spouse name: Not on file  . Number of children: Not on file  . Years of education: Not on file  . Highest education level: Not on file  Occupational History  . Not on file  Tobacco Use  . Smoking status: Current Every Day Smoker    Packs/day: 0.50    Types: Cigarettes  . Smokeless tobacco: Never Used  Substance and Sexual Activity  . Alcohol use: No  . Drug use: No  . Sexual activity: Not Currently  Other Topics Concern  . Not on file  Social History Narrative  . Not on file   Social Determinants of Health   Financial Resource Strain:   . Difficulty of Paying Living Expenses:   Food Insecurity:   . Worried About Charity fundraiser in the Last Year:   . Arboriculturist in the Last Year:   Transportation Needs:   . Film/video editor (Medical):   Marland Kitchen Lack of Transportation (Non-Medical):   Physical Activity:   . Days of Exercise per Week:   .  Minutes of Exercise per Session:   Stress:   . Feeling of Stress :   Social Connections:   . Frequency of Communication with Friends and Family:   . Frequency of Social Gatherings with Friends and Family:   . Attends Religious Services:   . Active Member of Clubs or Organizations:   . Attends Archivist Meetings:   Marland Kitchen Marital Status:   Intimate Partner Violence:   . Fear of Current or Ex-Partner:   . Emotionally Abused:   Marland Kitchen Physically Abused:   . Sexually Abused:     Outpatient Medications Prior to Visit  Medication Sig Dispense Refill  . albuterol (VENTOLIN HFA) 108 (90 Base) MCG/ACT inhaler Inhale 2 puffs into the lungs every 4  (four) hours as needed for wheezing or shortness of breath. 8 g 12  . cetirizine (ZYRTEC) 10 MG tablet Take 1 tablet (10 mg total) by mouth in the morning. 30 tablet 11  . docusate sodium (COLACE) 100 MG capsule Take 1 capsule (100 mg total) by mouth 3 (three) times daily as needed. 30 capsule 3  . fluticasone (FLONASE) 50 MCG/ACT nasal spray Place 1 spray into both nostrils daily. 16 g 3  . gabapentin (NEURONTIN) 300 MG capsule TAKE 1 CAPSULE (300 MG TOTAL) BY MOUTH 3 (THREE) TIMES DAILY. 90 capsule 3  . glucose monitoring kit (FREESTYLE) monitoring kit 1 each by Does not apply route as needed for other. 1 each 0  . insulin aspart (NOVOLOG) 100 UNIT/ML injection Inject 0-5 Units into the skin at bedtime. 10 mL 11  . insulin aspart protamine- aspart (NOVOLOG MIX 70/30) (70-30) 100 UNIT/ML injection Inject 0.2 mLs (20 Units total) into the skin 2 (two) times daily with a meal. 10 mL 12  . ipratropium-albuterol (DUONEB) 0.5-2.5 (3) MG/3ML SOLN Take 3 mLs by nebulization every 6 (six) hours as needed (shortness of breath, wheeze). 360 mL 12  . methocarbamol (ROBAXIN) 750 MG tablet Take 1 tablet (750 mg total) by mouth 3 (three) times daily. 90 tablet 3  . Misc. Devices (BARIATRIC ROLLATOR) MISC 1 Device by Does not apply route daily. 1 each 0  . montelukast (SINGULAIR) 10 MG tablet Take 1 tablet (10 mg total) by mouth at bedtime. 30 tablet 3  . furosemide (LASIX) 40 MG tablet Take 1 tablet (40 mg total) by mouth 2 (two) times daily. 60 tablet 1  . losartan (COZAAR) 50 MG tablet TAKE 1 TABLET (50 MG TOTAL) BY MOUTH DAILY. 30 tablet 3  . pantoprazole (PROTONIX) 40 MG tablet TAKE 1 TABLET (40 MG TOTAL) BY MOUTH 2 (TWO) TIMES DAILY BEFORE A MEAL. 60 tablet 3  . varenicline (CHANTIX) 0.5 MG tablet Take 1 tablet (0.5 mg total) by mouth 2 (two) times daily. (Patient not taking: Reported on 02/25/2020) 60 tablet 3  . carvedilol (COREG) 12.5 MG tablet Take 1 tablet (12.5 mg total) by mouth 2 (two) times daily with  a meal. (Patient not taking: Reported on 02/25/2020) 60 tablet 3   No facility-administered medications prior to visit.    Allergies  Allergen Reactions  . Aspirin Other (See Comments)    Abdominal pain    ROS Review of Systems  Constitutional: Positive for fatigue.       Rollator  HENT: Negative.   Eyes: Negative.   Respiratory: Positive for shortness of breath (frequent).   Cardiovascular: Negative.   Gastrointestinal: Positive for abdominal distention.  Endocrine: Negative.   Genitourinary: Negative.   Musculoskeletal: Positive for arthralgias (generalized.) and  back pain (chronic ).  Skin: Negative.   Allergic/Immunologic: Negative.   Neurological: Positive for dizziness (occasional ) and headaches (occasional ).  Hematological: Negative.   Psychiatric/Behavioral: Negative.       Objective:    Physical Exam  Constitutional: She is oriented to person, place, and time. She appears well-developed and well-nourished.  HENT:  Head: Normocephalic and atraumatic.  Eyes: Conjunctivae are normal.  Cardiovascular: Normal rate, regular rhythm, normal heart sounds and intact distal pulses.  Pulmonary/Chest: Effort normal and breath sounds normal.  Abdominal: Soft. Bowel sounds are normal. She exhibits distension (obese).  Musculoskeletal:        General: Edema (bilaterl lower extremity ) present.     Cervical back: Normal range of motion and neck supple.     Comments: Limited ROM in spine  Neurological: She is alert and oriented to person, place, and time. She has normal reflexes.  Skin: Skin is warm and dry.  Psychiatric: She has a normal mood and affect. Her behavior is normal. Judgment and thought content normal.  Nursing note and vitals reviewed.   BP (!) 153/78   Pulse 91   Temp 97.9 F (36.6 C)   Ht 5' 7"  (1.702 m)   Wt (!) 314 lb 3.2 oz (142.5 kg)   SpO2 99%   BMI 49.21 kg/m  Wt Readings from Last 3 Encounters:  02/25/20 (!) 314 lb 3.2 oz (142.5 kg)    02/18/20 (!) 315 lb 6.4 oz (143.1 kg)  01/19/20 (!) 313 lb 12.8 oz (142.3 kg)     Health Maintenance Due  Topic Date Due  . Hepatitis C Screening  Never done  . FOOT EXAM  Never done  . OPHTHALMOLOGY EXAM  Never done  . TETANUS/TDAP  Never done  . PAP SMEAR-Modifier  Never done  . MAMMOGRAM  Never done    There are no preventive care reminders to display for this patient.  Lab Results  Component Value Date   TSH 0.491 08/17/2014   Lab Results  Component Value Date   WBC 4.8 09/16/2019   HGB 15.6 09/16/2019   HCT 46.6 09/16/2019   MCV 84 09/16/2019   PLT 198 09/16/2019   Lab Results  Component Value Date   NA 141 09/16/2019   K 4.5 09/16/2019   CO2 22 09/16/2019   GLUCOSE 170 (H) 09/16/2019   BUN 13 09/16/2019   CREATININE 0.59 09/16/2019   BILITOT 0.3 09/16/2019   ALKPHOS 70 09/16/2019   AST 15 09/16/2019   ALT 18 09/16/2019   PROT 6.9 09/16/2019   ALBUMIN 4.1 09/16/2019   CALCIUM 9.8 09/16/2019   ANIONGAP 10 08/12/2019   Lab Results  Component Value Date   CHOL 197 11/07/2011   Lab Results  Component Value Date   HDL 70 11/07/2011   Lab Results  Component Value Date   LDLCALC 107 (H) 11/07/2011   Lab Results  Component Value Date   TRIG 99 11/07/2011   Lab Results  Component Value Date   CHOLHDL 2.8 11/07/2011   Lab Results  Component Value Date   HGBA1C 8.0 (A) 01/19/2020    Assessment & Plan:   1. Type 2 diabetes mellitus with hyperglycemia, with long-term current use of insulin (HCC) She will continue medication as prescribed, to decrease foods/beverages high in sugars and carbs and follow Heart Healthy or DASH diet. Increase physical activity to at least 30 minutes cardio exercise daily.   2. Hemoglobin A1C between 7% and 9% indicating borderline diabetic  control Hgb A1c is stable at 8.0 . Monitor.   3. Hyperglycemia  4. Essential hypertension Blood pressure is stable today; continue present plan and medications as prescribed.  She will continue to take medications as prescribed, to decrease high sodium intake, excessive alcohol intake, increase potassium intake, smoking cessation, and increase physical activity of at least 30 minutes of cardio activity daily. She will continue to follow Heart Healthy or DASH diet.  5. Bilateral lower extremity edema - spironolactone (ALDACTONE) 25 MG tablet; Take 1 tablet (25 mg total) by mouth daily.  Dispense: 30 tablet; Refill: 1 - furosemide (LASIX) 40 MG tablet; Take 1 tablet (40 mg total) by mouth 2 (two) times daily.  Dispense: 60 tablet; Refill: 6  6. Shortness of breath on exertion No signs or symptoms of respiratory distress noted or reported today.   7. Dependence on continuous supplemental oxygen Currently on 3 liters of continuous oxygen.   8. Follow up She will follow up 3 months.  Meds ordered this encounter  Medications  . spironolactone (ALDACTONE) 25 MG tablet    Sig: Take 1 tablet (25 mg total) by mouth daily.    Dispense:  30 tablet    Refill:  1  . furosemide (LASIX) 40 MG tablet    Sig: Take 1 tablet (40 mg total) by mouth 2 (two) times daily.    Dispense:  60 tablet    Refill:  6  . losartan (COZAAR) 50 MG tablet    Sig: Take 1 tablet (50 mg total) by mouth daily.    Dispense:  30 tablet    Refill:  6  . pantoprazole (PROTONIX) 40 MG tablet    Sig: Take 1 tablet (40 mg total) by mouth 2 (two) times daily before a meal.    Dispense:  60 tablet    Refill:  6    No orders of the defined types were placed in this encounter.   Referral Orders  No referral(s) requested today    Kathe Becton,  MSN, FNP-BC [address] [place], [place] [address] [phone] fax   Problem List Items Addressed This Visit      Cardiovascular and Mediastinum   Essential hypertension   Relevant Medications   spironolactone (ALDACTONE) 25 MG tablet   furosemide  (LASIX) 40 MG tablet   losartan (COZAAR) 50 MG tablet     Endocrine   Hemoglobin A1C between 7% and 9% indicating borderline diabetic control   Relevant Medications   losartan (COZAAR) 50 MG tablet    Other Visit Diagnoses    Type 2 diabetes mellitus with hyperglycemia, with long-term current use of insulin (HCC)    -  Primary   Relevant Medications   losartan (COZAAR) 50 MG tablet   Hyperglycemia       Bilateral lower extremity edema       Relevant Medications   spironolactone (ALDACTONE) 25 MG tablet   furosemide (LASIX) 40 MG tablet   Shortness of breath on exertion       Dependence on continuous supplemental oxygen       Follow up          Meds ordered this encounter  Medications  . spironolactone (ALDACTONE) 25 MG tablet    Sig: Take 1 tablet (25 mg total) by mouth daily.    Dispense:  30 tablet    Refill:  1  . furosemide (LASIX) 40 MG tablet  Sig: Take 1 tablet (40 mg total) by mouth 2 (two) times daily.    Dispense:  60 tablet    Refill:  6  . losartan (COZAAR) 50 MG tablet    Sig: Take 1 tablet (50 mg total) by mouth daily.    Dispense:  30 tablet    Refill:  6  . pantoprazole (PROTONIX) 40 MG tablet    Sig: Take 1 tablet (40 mg total) by mouth 2 (two) times daily before a meal.    Dispense:  60 tablet    Refill:  6    Follow-up: Return in about 3 months (around 05/27/2020).    Azzie Glatter, FNP

## 2020-02-27 ENCOUNTER — Ambulatory Visit: Payer: Self-pay | Admitting: Pulmonary Disease

## 2020-03-01 ENCOUNTER — Telehealth: Payer: Self-pay | Admitting: Family Medicine

## 2020-03-01 ENCOUNTER — Ambulatory Visit (INDEPENDENT_AMBULATORY_CARE_PROVIDER_SITE_OTHER)
Admission: RE | Admit: 2020-03-01 | Discharge: 2020-03-01 | Disposition: A | Discharge status: Home / Self Care | Payer: Self-pay | Source: Ambulatory Visit | Attending: Pulmonary Disease | Admitting: Pulmonary Disease

## 2020-03-01 ENCOUNTER — Ambulatory Visit (INDEPENDENT_AMBULATORY_CARE_PROVIDER_SITE_OTHER): Payer: Self-pay | Admitting: Pulmonary Disease

## 2020-03-01 ENCOUNTER — Encounter: Payer: Self-pay | Admitting: Pulmonary Disease

## 2020-03-01 ENCOUNTER — Other Ambulatory Visit: Payer: Self-pay

## 2020-03-01 VITALS — BP 138/84 | HR 88 | Temp 99.1°F | Ht 67.0 in | Wt 314.0 lb

## 2020-03-01 DIAGNOSIS — Z720 Tobacco use: Secondary | ICD-10-CM

## 2020-03-01 DIAGNOSIS — R0602 Shortness of breath: Secondary | ICD-10-CM

## 2020-03-01 DIAGNOSIS — I5189 Other ill-defined heart diseases: Secondary | ICD-10-CM

## 2020-03-01 DIAGNOSIS — G4733 Obstructive sleep apnea (adult) (pediatric): Secondary | ICD-10-CM

## 2020-03-01 DIAGNOSIS — R600 Localized edema: Secondary | ICD-10-CM

## 2020-03-01 DIAGNOSIS — J9610 Chronic respiratory failure, unspecified whether with hypoxia or hypercapnia: Secondary | ICD-10-CM

## 2020-03-01 LAB — D-DIMER, QUANTITATIVE: D-Dimer, Quant: 0.38 mcg/mL FEU (ref <0.50)

## 2020-03-01 LAB — CBC WITH DIFFERENTIAL/PLATELET
Basophils Absolute: 0 10*3/uL (ref 0.0–0.1)
Basophils Relative: 0.6 % (ref 0.0–3.0)
Eosinophils Absolute: 0.1 10*3/uL (ref 0.0–0.7)
Eosinophils Relative: 2.7 % (ref 0.0–5.0)
HCT: 43.9 % (ref 36.0–46.0)
Hemoglobin: 14.5 g/dL (ref 12.0–15.0)
Lymphocytes Relative: 25.1 % (ref 12.0–46.0)
Lymphs Abs: 1.3 10*3/uL (ref 0.7–4.0)
MCHC: 33 g/dL (ref 30.0–36.0)
MCV: 87.8 fl (ref 78.0–100.0)
Monocytes Absolute: 0.6 10*3/uL (ref 0.1–1.0)
Monocytes Relative: 11.5 % (ref 3.0–12.0)
Neutro Abs: 3.1 10*3/uL (ref 1.4–7.7)
Neutrophils Relative %: 60.1 % (ref 43.0–77.0)
Platelets: 173 10*3/uL (ref 150.0–400.0)
RBC: 5 Mil/uL (ref 3.87–5.11)
RDW: 14.4 % (ref 11.5–15.5)
WBC: 5.1 10*3/uL (ref 4.0–10.5)

## 2020-03-01 LAB — COMPREHENSIVE METABOLIC PANEL
ALT: 32 U/L (ref 0–35)
AST: 21 U/L (ref 0–37)
Albumin: 4.2 g/dL (ref 3.5–5.2)
Alkaline Phosphatase: 74 U/L (ref 39–117)
BUN: 16 mg/dL (ref 6–23)
CO2: 34 mEq/L — ABNORMAL HIGH (ref 19–32)
Calcium: 9.7 mg/dL (ref 8.4–10.5)
Chloride: 97 mEq/L (ref 96–112)
Creatinine, Ser: 0.58 mg/dL (ref 0.40–1.20)
GFR: 128.72 mL/min (ref >60.00)
Glucose, Bld: 196 mg/dL — ABNORMAL HIGH (ref 70–99)
Potassium: 4.1 mEq/L (ref 3.5–5.1)
Sodium: 136 mEq/L (ref 135–145)
Total Bilirubin: 0.4 mg/dL (ref 0.2–1.2)
Total Protein: 7.6 g/dL (ref 6.0–8.3)

## 2020-03-01 LAB — BRAIN NATRIURETIC PEPTIDE: Pro B Natriuretic peptide (BNP): 33 pg/mL (ref 0.0–100.0)

## 2020-03-01 MED ORDER — FLUTICASONE FUROATE-VILANTEROL 200-25 MCG/INH IN AEPB
1.0000 | INHALATION_SPRAY | Freq: Once | RESPIRATORY_TRACT | 0 refills | Status: AC
Start: 2020-03-01 — End: 2020-03-01

## 2020-03-01 NOTE — Addendum Note (Signed)
Addended by: Lauraine Rinne on: 03/01/2020 03:08 PM   Modules accepted: Orders

## 2020-03-01 NOTE — Assessment & Plan Note (Addendum)
Plan: Emphasized need to stop smoking We will set up clinical pharmacy team appointment for smoking cessation Chest x-ray today

## 2020-03-01 NOTE — Progress Notes (Signed)
@Patient  ID: Sharon Swanson, female    DOB: 1960/09/28, 59 y.o.   MRN: [id number]  Chief Complaint  Patient presents with  . Follow-up    pt states when doing activities sob.pt states using rescue inhaler every 4 hours    Referring provider: Eloise Levels, NP  HPI:  59 year old female current everyday smoker followed in our office for dyspnea on exertion and obstructive sleep apnea managed on CPAP  PMH: GERD, fatty liver, morbid obesity, anxiety, diastolic dysfunction, type 2 diabetes, fatty liver Smoker/ Smoking History: Current smoker.  Smoking 2 cigarettes a day.  19.5-pack-year smoking history Maintenance: None Pt of: Dr. Vaughan Browner  03/01/2020  - Visit   59 year old female current smoker last evaluated in our office by Dr. Vaughan Browner in July/2018.  At that time patient was evaluated for obstructive sleep apnea as well as dyspnea on exertion.  It was recommended that she continue CPAP therapy which she struggled to afford this and needed patient assistance.  She was also recommended to continue on Advair.  He was also emphasized that she needs to stop smoking.  Patient was requested to have follow-up with our office by primary care.  Patient presenting to office today.  She is unsure when she stopped taking Advair but she is not currently taking it.  She has no idea when this ran out.  She does report she is using a CPAP.  Her DME is [employer].  We will request a download.  She continues to be maintained on 3 L continuous 24/7.  Patient is concerned regarding his of her worsened shortness of breath.  She does continue to smoke.  She is only smoking 2 cigarettes a day.  19.5-pack-year smoking history.  Wells Criteria Modified Wells criteria: Clinical assessment for PE Clinical symptoms of DVT (leg swelling, pain with palpation) - 3 Other diagnoses less likely than pulmonary embolism - 3 Heart rate greater than 100 - 1.5 Immobilization (disease greater in 3 days) or surgery in the previous 4  weeks - 1.5 Previous DVT/PE - 1.5 Hemoptysis - 1 Malignancy - 1  Probability Traditional clinical probability assessment (Wells criteria) High - greater than 6 Moderate - 2 to 6 Low less than 2  Simplify clinical probability assessment (modified Wells criteria) PE likely-greater than 4 PE unlikely little less than or equal to 4  Pretest probability of DVT (Wells score)  Clinical features: Active cancer (treatment ongoing or within the previous 6 months or palliative) -1 Paralysis, paralysis, or recent plaster immobilization of the lower extremities -1 Recently bedridden for more than 3 days or major surgery within 4 weeks -1 Localized tenderness along the distribution of the deep vein system -1 Entire leg swollen -1 Calf swelling by more than 3 cm when compared to the asymptomatic leg (measured below tibial tuberosity) -1 Pitting edema (greater and symptomatic leg) -1 Collateral superficial veins -1 Alternative diagnosis as likely or more than likely that of a deep vein thrombosis    -2   High probability - 3 or greater Moderate probability -  1 or 2 Low probability - 0 or less  Modification  DVT likely-2 or greater DVT unlikely-1 or less  Score: 2   Questionaires / Pulmonary Flowsheets:   MMRC: mMRC Dyspnea Scale mMRC Score  03/01/2020 3    Tests:   08/02/2019-CTA chest-negative exam for PE, bandlike scarring or atelectasis, aortic arthrosclerosis  12/20/2016-pulmonary function test-FVC 1.27 (40% predicted), postbronchodilator ratio 95, postbronchodilator FEV1 1.19 (48% predicted), no bronchodilator response, DLCO 17.85 (63% predicted),  ERV 0.11 (15% predicted)  06/24/2016-sleep study-moderate obstructive sleep apnea with an AHI of 18, SaO2 low 65%   FENO:  Lab Results  Component Value Date   NITRICOXIDE 5 04/09/2017    PFT: PFT Results Latest Ref Rng & Units 12/20/2016  FVC-Pre L 1.27  FVC-Predicted Pre % 40  FVC-Post L 1.26  FVC-Predicted Post % 40   Pre FEV1/FVC % % 91  Post FEV1/FCV % % 95  FEV1-Pre L 1.16  FEV1-Predicted Pre % 47  FEV1-Post L 1.19  DLCO UNC% % 63  DLCO COR %Predicted % 116  TLC L 3.24  TLC % Predicted % 58  RV % Predicted % 66    WALK:  No flowsheet data found.  Imaging: No results found.  Lab Results:  CBC    Component Value Date/Time   WBC 4.8 09/16/2019 1144   WBC 4.4 08/12/2019 0531   RBC 5.53 (H) 09/16/2019 1144   RBC 5.62 (H) 08/12/2019 0531   HGB 15.6 09/16/2019 1144   HCT 46.6 09/16/2019 1144   PLT 198 09/16/2019 1144   MCV 84 09/16/2019 1144   MCH 28.2 09/16/2019 1144   MCH 27.8 08/12/2019 0531   MCHC 33.5 09/16/2019 1144   MCHC 29.2 (L) 08/12/2019 0531   RDW 13.4 09/16/2019 1144   LYMPHSABS 1.2 09/16/2019 1144   MONOABS 0.5 08/02/2019 0610   EOSABS 0.1 09/16/2019 1144   BASOSABS 0.0 09/16/2019 1144    BMET    Component Value Date/Time   NA 141 09/16/2019 1144   K 4.5 09/16/2019 1144   CL 101 09/16/2019 1144   CO2 22 09/16/2019 1144   GLUCOSE 170 (H) 09/16/2019 1144   GLUCOSE 155 (H) 08/12/2019 0531   BUN 13 09/16/2019 1144   CREATININE 0.59 09/16/2019 1144   CALCIUM 9.8 09/16/2019 1144   GFRNONAA 101 09/16/2019 1144   GFRAA 117 09/16/2019 1144    BNP    Component Value Date/Time   BNP 119.1 (H) 08/02/2019 0610    ProBNP No results found for: PROBNP  Specialty Problems      Pulmonary Problems   Acute respiratory failure (HCC)   Pulmonary nodule, left   COPD (chronic obstructive pulmonary disease) (HCC)   OSA (obstructive sleep apnea)   Chronic respiratory failure (HCC)   Dependence on continuous supplemental oxygen   Shortness of breath on exertion      Allergies  Allergen Reactions  . Aspirin Other (See Comments)    Abdominal pain    Immunization History  Administered Date(s) Administered  . Influenza,inj,Quad PF,6+ Mos 08/18/2014, 06/20/2016  . Moderna SARS-COVID-2 Vaccination 12/12/2019, 01/13/2020  . Pneumococcal Polysaccharide-23  08/18/2014  . Pneumococcal-Unspecified 05/29/2016    Past Medical History:  Diagnosis Date  . Acid reflux   . Asthma   . Bilateral lower extremity edema 01/2020  . COPD (chronic obstructive pulmonary disease) ([place])   . CPAP (continuous positive airway pressure) dependence   . Diabetes ([place])   . Diabetes mellitus without complication ([place])    Type II  . Fatty liver 11/08/2011  . Fibroids 11/08/2011  . Hyperglycemia   . Hypertension   . Microalbuminuria 01/2020  . Obesity (BMI 30-39.9) 11/08/2011  . Osteoarthritis of right acromioclavicular joint   . Pancreatitis 11/08/2011  . Rotator cuff tear, right   . Shortness of breath on exertion 01/2020  . Sleep apnea    Can not afford Cpap machine  . Ventral hernia     Tobacco History: Social History   Tobacco Use  Smoking Status Current Every Day Smoker  . Packs/day: 0.50  . Years: 39.00  . Pack years: 19.50  . Types: Cigarettes  . Start date: 09/25/1980  Smokeless Tobacco Never Used  Tobacco Comment   pt states she smoke1-2 cigerettes a day   Ready to quit: No Counseling given: Yes Comment: pt states she smoke1-2 cigerettes a day  Smoking assessment and cessation counseling  Patient currently smoking: 2 cigarettes a day I have advised the patient to quit/stop smoking as soon as possible due to high risk for multiple medical problems.  It will also be very difficult for [place] to manage patient's  respiratory symptoms and status if we continue to expose her lungs to a known irritant.  We do not advise e-cigarettes as a form of stopping smoking.  Patient is  willing to quit smoking.  Is willing to meet with the clinical pharmacy team.  To discuss smoking cessation.  I have advised the patient that we can assist and have options of nicotine replacement therapy, provided smoking cessation education today, provided smoking cessation counseling, and provided cessation resources.  Follow-up next office visit office visit for assessment  of smoking cessation.    Smoking cessation counseling advised for: 4 minutes    Outpatient Encounter Medications as of 03/01/2020  Medication Sig  . albuterol (VENTOLIN HFA) 108 (90 Base) MCG/ACT inhaler Inhale 2 puffs into the lungs every 4 (four) hours as needed for wheezing or shortness of breath.  . cetirizine (ZYRTEC) 10 MG tablet Take 1 tablet (10 mg total) by mouth in the morning.  . docusate sodium (COLACE) 100 MG capsule Take 1 capsule (100 mg total) by mouth 3 (three) times daily as needed.  . fluticasone (FLONASE) 50 MCG/ACT nasal spray Place 1 spray into both nostrils daily.  . furosemide (LASIX) 40 MG tablet Take 1 tablet (40 mg total) by mouth 2 (two) times daily.  Marland Kitchen gabapentin (NEURONTIN) 300 MG capsule TAKE 1 CAPSULE (300 MG TOTAL) BY MOUTH 3 (THREE) TIMES DAILY.  Marland Kitchen glucose monitoring kit (FREESTYLE) monitoring kit 1 each by Does not apply route as needed for other.  . insulin aspart (NOVOLOG) 100 UNIT/ML injection Inject 0-5 Units into the skin at bedtime.  . insulin aspart protamine- aspart (NOVOLOG MIX 70/30) (70-30) 100 UNIT/ML injection Inject 0.2 mLs (20 Units total) into the skin 2 (two) times daily with a meal.  . ipratropium-albuterol (DUONEB) 0.5-2.5 (3) MG/3ML SOLN Take 3 mLs by nebulization every 6 (six) hours as needed (shortness of breath, wheeze).  Marland Kitchen losartan (COZAAR) 50 MG tablet Take 1 tablet (50 mg total) by mouth daily.  . methocarbamol (ROBAXIN) 750 MG tablet Take 1 tablet (750 mg total) by mouth 3 (three) times daily.  . Misc. Devices (BARIATRIC ROLLATOR) MISC 1 Device by Does not apply route daily.  . montelukast (SINGULAIR) 10 MG tablet Take 1 tablet (10 mg total) by mouth at bedtime.  . pantoprazole (PROTONIX) 40 MG tablet Take 1 tablet (40 mg total) by mouth 2 (two) times daily before a meal.  . spironolactone (ALDACTONE) 25 MG tablet Take 1 tablet (25 mg total) by mouth daily.  . varenicline (CHANTIX) 0.5 MG tablet Take 1 tablet (0.5 mg total) by  mouth 2 (two) times daily.  . fluticasone furoate-vilanterol (BREO ELLIPTA) 200-25 MCG/INH AEPB Inhale 1 puff into the lungs once for 1 dose.   No facility-administered encounter medications on file as of 03/01/2020.     Review of Systems  Review of Systems  Constitutional:  Positive for fatigue. Negative for activity change and fever.  HENT: Negative for sinus pressure, sinus pain and sore throat.   Respiratory: Positive for shortness of breath and wheezing. Negative for cough.   Cardiovascular: Negative for chest pain and palpitations.  Gastrointestinal: Negative for diarrhea, nausea and vomiting.  Musculoskeletal: Negative for arthralgias.  Neurological: Negative for dizziness.  Psychiatric/Behavioral: Negative for sleep disturbance. The patient is not nervous/anxious.      Physical Exam  BP 138/84 (BP Location: Left Arm, Cuff Size: Normal)   Pulse 88   Temp 99.1 F (37.3 C) (Oral)   Ht 5' 7"  (1.702 m)   Wt (!) 314 lb (142.4 kg)   SpO2 98%   BMI 49.18 kg/m   Wt Readings from Last 5 Encounters:  03/01/20 (!) 314 lb (142.4 kg)  02/25/20 (!) 314 lb 3.2 oz (142.5 kg)  02/18/20 (!) 315 lb 6.4 oz (143.1 kg)  01/19/20 (!) 313 lb 12.8 oz (142.3 kg)  10/17/19 (!) 307 lb (139.3 kg)    BMI Readings from Last 5 Encounters:  03/01/20 49.18 kg/m  02/25/20 49.21 kg/m  02/18/20 49.40 kg/m  01/19/20 49.15 kg/m  10/17/19 48.08 kg/m     Physical Exam Vitals and nursing note reviewed.  Constitutional:      General: She is not in acute distress.    Appearance: Normal appearance. She is obese.  HENT:     Head: Normocephalic and atraumatic.     Right Ear: External ear normal.     Left Ear: External ear normal.     Nose: Nose normal. No congestion.     Mouth/Throat:     Mouth: Mucous membranes are moist.     Pharynx: Oropharynx is clear.  Eyes:     Pupils: Pupils are equal, round, and reactive to light.  Cardiovascular:     Rate and Rhythm: Normal rate and regular  rhythm.     Pulses: Normal pulses.     Heart sounds: Normal heart sounds. No murmur.  Pulmonary:     Effort: Pulmonary effort is normal. No respiratory distress.     Breath sounds: No decreased air movement. Wheezing (exp ) present. No decreased breath sounds or rales.  Abdominal:     General: Abdomen is flat. Bowel sounds are normal.     Palpations: Abdomen is soft.  Musculoskeletal:        General: Swelling and tenderness present.     Cervical back: Normal range of motion.     Right lower leg: Edema present.     Left lower leg: Edema present.  Skin:    General: Skin is warm and dry.     Capillary Refill: Capillary refill takes less than 2 seconds.  Neurological:     General: No focal deficit present.     Mental Status: She is alert and oriented to person, place, and time. Mental status is at baseline.     Gait: Gait abnormal (Tolerated walk in office, completed 3 laps, slow pace, with walker).  Psychiatric:        Mood and Affect: Mood normal.        Behavior: Behavior normal.        Thought Content: Thought content normal.        Judgment: Judgment normal.       Assessment & Plan:   Diastolic dysfunction Suspected diastolic dysfunction Not established with cardiology Bilateral lower extremity edema  Plan: We will refer to cardiologist  Shortness of breath on exertion Worsening shortness  of breath on exertion This is almost certainly multifactorial given the fact the patient is morbidly obese, severe restriction on pulmonary function testing, current tobacco smoker, not on any maintenance inhalers, likely diastolic dysfunction, likely fluid overloaded  Low Wells score for PE Moderate Wells score for DVT Bilateral lower extremity swelling with tenderness  Plan: Lab work today Chest x-ray Walk today in office Emphasized need to stop smoking Start Breo Ellipta 200 4-week follow-up with Dr. Vaughan Browner  Bilateral lower extremity edema Plan: Continue Lasix Lab work  Scientist, forensic with cardiology  Tobacco abuse Plan: Emphasized need to stop smoking We will set up clinical pharmacy team appointment for smoking cessation Chest x-ray today   OSA (obstructive sleep apnea) Plan: Continue CPAP  Chronic respiratory failure ([place]) Plan: Continue 3 L O2 24/7 Walk today in office Chest x-ray today Lab work today     Return in about 4 weeks (around 03/29/2020), or if symptoms worsen or fail to improve, for Follow up with Dr. Vaughan Browner.   Lauraine Rinne, NP 03/01/2020   This appointment required 42 minutes of patient care (this includes precharting, chart review, review of results, face-to-face care, etc.).

## 2020-03-01 NOTE — Assessment & Plan Note (Signed)
Plan: Continue CPAP 

## 2020-03-01 NOTE — Assessment & Plan Note (Signed)
Suspected diastolic dysfunction Not established with cardiology Bilateral lower extremity edema  Plan: We will refer to cardiologist

## 2020-03-01 NOTE — Assessment & Plan Note (Signed)
Plan: Continue 3 L O2 24/7 Walk today in office Chest x-ray today Lab work today

## 2020-03-01 NOTE — Assessment & Plan Note (Addendum)
Plan: Continue Lasix Lab work Scientist, forensic with cardiology

## 2020-03-01 NOTE — Patient Instructions (Addendum)
You were seen today by Lauraine Rinne, NP  for:   I am glad that you came into our office today.  I am concerned regarding your lower extremity swelling as well as painful to touch lower extremities.  We will get you restarted on a maintenance inhaler.  We will also do lab work today, a chest x-ray, walk today in office.  Based off your lab work results we may need to consider further imaging such as a CT of your chest or lower extremity Dopplers to check for clots in your legs.  We will refer you to cardiology today.  I agree with proceeding forward with the nephrology referral as primary care had discussed with you.  You need to keep your follow-up visits with our office.  We will get you scheduled for close follow-up with Dr. Vaughan Browner.  We also will have you set up an appointment with the clinical pharmacy team to work on smoking cessation, inhaler teaching as well as a med reconciliation.  Take care and stay safe,  Sharon Swanson  1. Shortness of breath on exertion  - D-Dimer, Quantitative; Future - B Nat Peptide; Future - Comp Met (CMET); Future - CBC with Differential/Platelet; Future - Ambulatory referral to Cardiology - DG Chest 2 View; Future  Walk today in office  Start Breo Ellipta 200 >>> Take 1 puff daily in the morning right when you wake up >>>Rinse your mouth out after use >>>This is a daily maintenance inhaler, NOT a rescue inhaler >>>Contact our office if you are having difficulties affording or obtaining this medication >>>It is important for you to be able to take this daily and not miss any doses   Samples provided today  Please present to our office in 2-4 weeks for an appointment with the clinical pharmacy team for:  . Smoking cessation . Medication Management  . Medication reconciliation  . Medication Access  . Inhaler teaching - ellipta device   2. Bilateral lower extremity edema  - Ambulatory referral to Cardiology  3. Diastolic dysfunction  - B Nat Peptide;  Future - Ambulatory referral to Cardiology  4. Tobacco abuse  - DG Chest 2 View; Future  We recommend that you stop smoking.  >>>You need to set a quit date >>>If you have friends or family who smoke, let them know you are trying to quit and not to smoke around you or in your living environment  Smoking Cessation Resources:  1 800 QUIT NOW  >>> Patient to call this resource and utilize it to help support her quit smoking >>> Keep up your hard work with stopping smoking  You can also contact the [facility] >>>For smoking cessation classes call [phone]  We do not recommend using e-cigarettes as a form of stopping smoking  You can sign up for smoking cessation support texts and information:  >>>https://smokefree.gov/smokefreetxt  5. OSA (obstructive sleep apnea)  - CBC with Differential/Platelet; Future  We recommend that you continue using your CPAP daily >>>Keep up the hard work using your device >>> Goal should be wearing this for the entire night that you are sleeping, at least 4 to 6 hours  Remember:  . Do not drive or operate heavy machinery if tired or drowsy.  . Please notify the supply company and office if you are unable to use your device regularly due to missing supplies or machine being broken.  . Work on maintaining a healthy weight and following your recommended nutrition plan  . Maintain  proper daily exercise and movement  . Maintaining proper use of your device can also help improve management of other chronic illnesses such as: Blood pressure, blood sugars, and weight management.   BiPAP/ CPAP Cleaning:  >>>Clean weekly, with Dawn soap, and bottle brush.  Set up to air dry. >>> Wipe mask out daily with wet wipe or towelette    We recommend today:  Orders Placed This Encounter  Procedures  . DG Chest 2 View    Standing Status:   Future    Standing Expiration Date:   07/01/2020    Order Specific Question:   Reason for Exam (SYMPTOM   OR DIAGNOSIS REQUIRED)    Answer:   doe, smoker    Order Specific Question:   Preferred imaging location?    Answer:   Internal    Order Specific Question:   Radiology Contrast Protocol - do NOT remove file path    Answer:   [email or web address]  . D-Dimer, Quantitative    Standing Status:   Future    Standing Expiration Date:   03/01/2021  . B Nat Peptide    Standing Status:   Future    Standing Expiration Date:   03/01/2021  . Comp Met (CMET)    Standing Status:   Future    Standing Expiration Date:   03/01/2021  . CBC with Differential/Platelet    Standing Status:   Future    Standing Expiration Date:   03/01/2021    Order Specific Question:   Release to patient    Answer:   Immediate  . Ambulatory referral to Cardiology    Referral Priority:   Urgent    Referral Type:   Consultation    Referral Reason:   Specialty Services Required    Requested Specialty:   Cardiology    Number of Visits Requested:   1   Orders Placed This Encounter  Procedures  . DG Chest 2 View  . D-Dimer, Quantitative  . B Nat Peptide  . Comp Met (CMET)  . CBC with Differential/Platelet  . Ambulatory referral to Cardiology   No orders of the defined types were placed in this encounter.   Follow Up:    Return in about 4 weeks (around 03/29/2020), or if symptoms worsen or fail to improve, for Follow up with Dr. Vaughan Browner.   Please present to our office in 2-4 weeks for an appointment with the clinical pharmacy team for:  . Smoking cessation . Medication Management  . Medication reconciliation  . Medication Access  . Inhaler teaching - Ellipta device  ;   Please do your part to reduce the spread of COVID-19:      Reduce your risk of any infection  and COVID19 by using the similar precautions used for avoiding the common cold or flu:  Marland Kitchen Wash your hands often with soap and warm water for at least 20 seconds.  If soap and water are not readily available, use an  alcohol-based hand sanitizer with at least 60% alcohol.  . If coughing or sneezing, cover your mouth and nose by coughing or sneezing into the elbow areas of your shirt or coat, into a tissue or into your sleeve (not your hands). Langley Gauss A MASK when in public  . Avoid shaking hands with others and consider head nods or verbal greetings only. . Avoid touching your eyes, nose, or mouth with unwashed hands.  . Avoid close contact with people who are sick. . Avoid places or events with  large numbers of people in one location, like concerts or sporting events. . If you have some symptoms but not all symptoms, continue to monitor at home and seek medical attention if your symptoms worsen. . If you are having a medical emergency, call 911.   [place] / e-Visit: [email or web address]         [facility]: [facility]: [phone]                   [facility]: [phone]     It is flu season:   >>> Best ways to protect herself from the flu: Receive the yearly flu vaccine, practice good hand hygiene washing with soap and also using hand sanitizer when available, eat a nutritious meals, get adequate rest, hydrate appropriately   Please contact the office if your symptoms worsen or you have concerns that you are not improving.   Thank you for choosing [facility] for your healthcare, and for allowing [place] to partner with you on your healthcare journey. I am thankful to be able to provide care to you today.   Wyn Quaker FNP-C

## 2020-03-01 NOTE — Assessment & Plan Note (Signed)
Worsening shortness of breath on exertion This is almost certainly multifactorial given the fact the patient is morbidly obese, severe restriction on pulmonary function testing, current tobacco smoker, not on any maintenance inhalers, likely diastolic dysfunction, likely fluid overloaded  Low Wells score for PE Moderate Wells score for DVT Bilateral lower extremity swelling with tenderness  Plan: Lab work today Chest x-ray Walk today in office Emphasized need to stop smoking Start Breo Ellipta 200 4-week follow-up with Dr. Vaughan Browner

## 2020-03-09 NOTE — Telephone Encounter (Signed)
Done

## 2020-03-16 ENCOUNTER — Telehealth: Payer: Self-pay | Admitting: Pulmonary Disease

## 2020-03-16 NOTE — Telephone Encounter (Signed)
Called patient and she is reporting not using CPAP because she feels like she can not breath with it on. She wants to talk to Dr. Vaughan Browner about it at her next appointment in July.

## 2020-03-16 NOTE — Telephone Encounter (Signed)
03/16/2020  Can we please contact the patient and notify her that we have heard back from her DME company.  They are reporting that patient has not had adequate compliance with using her CPAP.  We can discuss this at the upcoming office visit.  If the patient is interested in a earlier office visit please offer that to her.  Is very important that she resume CPAP therapy and use it every night.  Wyn Quaker, FNP

## 2020-03-19 ENCOUNTER — Other Ambulatory Visit: Payer: Self-pay

## 2020-03-19 ENCOUNTER — Ambulatory Visit (INDEPENDENT_AMBULATORY_CARE_PROVIDER_SITE_OTHER): Payer: Self-pay | Admitting: Interventional Cardiology

## 2020-03-19 ENCOUNTER — Encounter: Payer: Self-pay | Admitting: Interventional Cardiology

## 2020-03-19 VITALS — BP 156/98 | HR 107 | Ht 67.0 in | Wt 315.6 lb

## 2020-03-19 DIAGNOSIS — I5032 Chronic diastolic (congestive) heart failure: Secondary | ICD-10-CM

## 2020-03-19 DIAGNOSIS — R600 Localized edema: Secondary | ICD-10-CM

## 2020-03-19 DIAGNOSIS — I110 Hypertensive heart disease with heart failure: Secondary | ICD-10-CM

## 2020-03-19 DIAGNOSIS — Z720 Tobacco use: Secondary | ICD-10-CM

## 2020-03-19 MED ORDER — CARVEDILOL 6.25 MG PO TABS
6.2500 mg | ORAL_TABLET | Freq: Two times a day (BID) | ORAL | 3 refills | Status: DC
Start: 2020-03-19 — End: 2020-05-31

## 2020-03-19 MED ORDER — FUROSEMIDE 40 MG PO TABS
ORAL_TABLET | ORAL | 0 refills | Status: DC
Start: 2020-03-19 — End: 2020-06-28

## 2020-03-19 NOTE — Patient Instructions (Addendum)
Medication Instructions:  Your physician has recommended you make the following change in your medication:   1) Take Metoprolol 80mg  twice daily for 3 days then take 40mg  twice daily. 2) Start Carvedilol 6.25mg  twice daily  *If you need a refill on your cardiac medications before your next appointment, please call your pharmacy*   Lab Work: BMET when you come to see Pharmacist @ Hypertension [facility]  If you have labs (blood work) drawn today and your tests are completely normal, you will receive your results only by: Marland Kitchen MyChart Message (if you have MyChart) OR . A paper copy in the mail If you have any lab test that is abnormal or we need to change your treatment, we will call you to review the results.   Testing/Procedures: None   Follow-Up: At [facility], you and your health needs are our priority.  As part of our continuing mission to provide you with exceptional heart care, we have created designated Provider Care Teams.  These Care Teams include your primary Cardiologist (physician) and Advanced Practice Providers (APPs -  Physician Assistants and Nurse Practitioners) who all work together to provide you with the care you need, when you need it.  We recommend signing up for the patient portal called "MyChart".  Sign up information is provided on this After Visit Summary.  MyChart is used to connect with patients for Virtual Visits (Telemedicine).  Patients are able to view lab/test results, encounter notes, upcoming appointments, etc.  Non-urgent messages can be sent to your provider as well.   To learn more about what you can do with MyChart, go to [email or web address].    Your next appointment:   Next week with Hypertension clinic.  Other Instructions Dr Irish Lack would like for you to see our Pharmacist in the Hypertension Clinic next week! You can have your bloodwork done the same day as that appt.   Low-Sodium Eating Plan Sodium, which is an element that makes up  salt, helps you maintain a healthy balance of fluids in your body. Too much sodium can increase your blood pressure and cause fluid and waste to be held in your body. Your health care provider or dietitian may recommend following this plan if you have high blood pressure (hypertension), kidney disease, liver disease, or heart failure. Eating less sodium can help lower your blood pressure, reduce swelling, and protect your heart, liver, and kidneys. What are tips for following this plan? General guidelines  Most people on this plan should limit their sodium intake to 1,500-2,000 mg (milligrams) of sodium each day. Reading food labels   The Nutrition Facts label lists the amount of sodium in one serving of the food. If you eat more than one serving, you must multiply the listed amount of sodium by the number of servings.  Choose foods with less than 140 mg of sodium per serving.  Avoid foods with 300 mg of sodium or more per serving. Shopping  Look for lower-sodium products, often labeled as "low-sodium" or "no salt added."  Always check the sodium content even if foods are labeled as "unsalted" or "no salt added".  Buy fresh foods. ? Avoid canned foods and premade or frozen meals. ? Avoid canned, cured, or processed meats  Buy breads that have less than 80 mg of sodium per slice. Cooking  Eat more home-cooked food and less restaurant, buffet, and fast food.  Avoid adding salt when cooking. Use salt-free seasonings or herbs instead of table salt or sea salt. Check with  your health care provider or pharmacist before using salt substitutes.  Cook with plant-based oils, such as canola, sunflower, or olive oil. Meal planning  When eating at a restaurant, ask that your food be prepared with less salt or no salt, if possible.  Avoid foods that contain MSG (monosodium glutamate). MSG is sometimes added to [place] food, bouillon, and some canned foods. What foods are recommended? The  items listed may not be a complete list. Talk with your dietitian about what dietary choices are best for you. Grains Low-sodium cereals, including oats, puffed wheat and rice, and shredded wheat. Low-sodium crackers. Unsalted rice. Unsalted pasta. Low-sodium bread. Whole-grain breads and whole-grain pasta. Vegetables Fresh or frozen vegetables. "No salt added" canned vegetables. "No salt added" tomato sauce and paste. Low-sodium or reduced-sodium tomato and vegetable juice. Fruits Fresh, frozen, or canned fruit. Fruit juice. Meats and other protein foods Fresh or frozen (no salt added) meat, poultry, seafood, and fish. Low-sodium canned tuna and salmon. Unsalted nuts. Dried peas, beans, and lentils without added salt. Unsalted canned beans. Eggs. Unsalted nut butters. Dairy Milk. Soy milk. Cheese that is naturally low in sodium, such as ricotta cheese, fresh mozzarella, or Swiss cheese Low-sodium or reduced-sodium cheese. Cream cheese. Yogurt. Fats and oils Unsalted butter. Unsalted margarine with no trans fat. Vegetable oils such as canola or olive oils. Seasonings and other foods Fresh and dried herbs and spices. Salt-free seasonings. Low-sodium mustard and ketchup. Sodium-free salad dressing. Sodium-free light mayonnaise. Fresh or refrigerated horseradish. Lemon juice. Vinegar. Homemade, reduced-sodium, or low-sodium soups. Unsalted popcorn and pretzels. Low-salt or salt-free chips. What foods are not recommended? The items listed may not be a complete list. Talk with your dietitian about what dietary choices are best for you. Grains Instant hot cereals. Bread stuffing, pancake, and biscuit mixes. Croutons. Seasoned rice or pasta mixes. Noodle soup cups. Boxed or frozen macaroni and cheese. Regular salted crackers. Self-rising flour. Vegetables Sauerkraut, pickled vegetables, and relishes. Olives. [place] fries. Onion rings. Regular canned vegetables (not low-sodium or reduced-sodium). Regular  canned tomato sauce and paste (not low-sodium or reduced-sodium). Regular tomato and vegetable juice (not low-sodium or reduced-sodium). Frozen vegetables in sauces. Meats and other protein foods Meat or fish that is salted, canned, smoked, spiced, or pickled. Bacon, ham, sausage, hotdogs, corned beef, chipped beef, packaged lunch meats, salt pork, jerky, pickled herring, anchovies, regular canned tuna, sardines, salted nuts. Dairy Processed cheese and cheese spreads. Cheese curds. Blue cheese. Feta cheese. String cheese. Regular cottage cheese. Buttermilk. Canned milk. Fats and oils Salted butter. Regular margarine. Ghee. Bacon fat. Seasonings and other foods Onion salt, garlic salt, seasoned salt, table salt, and sea salt. Canned and packaged gravies. Worcestershire sauce. Tartar sauce. Barbecue sauce. Teriyaki sauce. Soy sauce, including reduced-sodium. Steak sauce. Fish sauce. Oyster sauce. Cocktail sauce. Horseradish that you find on the shelf. Regular ketchup and mustard. Meat flavorings and tenderizers. Bouillon cubes. Hot sauce and Tabasco sauce. Premade or packaged marinades. Premade or packaged taco seasonings. Relishes. Regular salad dressings. Salsa. Potato and tortilla chips. Corn chips and puffs. Salted popcorn and pretzels. Canned or dried soups. Pizza. Frozen entrees and pot pies. Summary  Eating less sodium can help lower your blood pressure, reduce swelling, and protect your heart, liver, and kidneys.  Most people on this plan should limit their sodium intake to 1,500-2,000 mg (milligrams) of sodium each day.  Canned, boxed, and frozen foods are high in sodium. Restaurant foods, fast foods, and pizza are also very high in sodium. You also  get sodium by adding salt to food.  Try to cook at home, eat more fresh fruits and vegetables, and eat less fast food, canned, processed, or prepared foods. This information is not intended to replace advice given to you by your health care  provider. Make sure you discuss any questions you have with your health care provider. Document Revised: 08/24/2017 Document Reviewed: 09/04/2016 Elsevier Patient Education  2020 [employer].

## 2020-03-19 NOTE — Progress Notes (Signed)
Cardiology Office Note   Date:  03/19/2020   ID:  Sharon Swanson, DOB 13-Oct-1960, MRN [id number]  PCP:  Azzie Glatter, FNP    No chief complaint on file.  Leg swelling  Wt Readings from Last 3 Encounters:  03/19/20 (!) 315 lb 9.6 oz (143.2 kg)  03/01/20 (!) 314 lb (142.4 kg)  02/25/20 (!) 314 lb 3.2 oz (142.5 kg)       History of Present Illness: Sharon Swanson is a 59 y.o. female who is being seen today for the evaluation of LE edema at the request of Lauraine Rinne, NP.  Records from 07/2019 show: "59 year old female with history of hypertension, COPD, diabetes mellitus, IDDM, smoking, just quit presented to ED with 2 weeks of worsening shortness of breath. Per patient, she had stopped taking her medication 2 weeks ago as she lost her insurance. She started noticing shortness of breath, worse with exertion, also has not been able to sleep, has been sitting up on the couch for last 2 weeks. She also noticed swelling in her both lower legs, worsening. Right lower leg was becoming more tight, tender and red. Patient reported that she had a fever of 101 F prior to admission night with coughing. She also reports chest tightness with exertion. No nausea or vomiting, stated she had diarrhea week prior. No recent long distance travels. Patient has been home due to work injury since April 2020. COVID-19 test negative BP 201/108 at the time of admission.  Since being hospitalized patient has been treated with IV Lasix for diastolic heart failure exacerbation as well as 5 days antibiotics for CAP and right lower extremity cellulitis, both of which resolved. Pulmonary was consulted on 11/12 due to persistent O2 requirements.  See below for further details  Patient was discharged in improving and stable condition on 11/17 with 2 L nasal cannula and plan to get CPAP at home.  Also had home appointment and primary care appointment."  Echo in 11/20 showed: "Left ventricular  ejection fraction, by visual estimation, is 60 to  65%. The left ventricle has normal function. There is moderately increased  left ventricular hypertrophy.  2. Global right ventricle has normal systolic function.The right  ventricular size is normal. No increase in right ventricular wall  thickness.  3. Left atrial size was mildly dilated.  4. Right atrial size was not well visualized.  5. The pericardial effusion is circumferential.  6. Trivial pericardial effusion is present.  7. Mild aortic valve annular calcification.  8. The mitral valve is normal in structure. Trace mitral valve  regurgitation. No evidence of mitral stenosis.  9. The tricuspid valve is normal in structure. Tricuspid valve  regurgitation is not demonstrated.  10. The aortic valve is normal in structure. Aortic valve regurgitation is  not visualized. No evidence of aortic valve sclerosis or stenosis.  11. There is Mild calcification of the aortic valve.  12. There is Mild thickening of the aortic valve.  13. The pulmonic valve was not well visualized. Pulmonic valve  regurgitation is not visualized.  14. The inferior vena cava is normal in size with greater than 50%  respiratory variability, suggesting right atrial pressure of 3 mmHg.  15. The interatrial septum was not well visualized. "  She states she is taking her meds.  Past Medical History:  Diagnosis Date  . Acid reflux   . Asthma   . Bilateral lower extremity edema 01/2020  . COPD (chronic obstructive pulmonary disease) ([place])   .  CPAP (continuous positive airway pressure) dependence   . Diabetes ([place])   . Diabetes mellitus without complication ([place])    Type II  . Fatty liver 11/08/2011  . Fibroids 11/08/2011  . Hyperglycemia   . Hypertension   . Microalbuminuria 01/2020  . Obesity (BMI 30-39.9) 11/08/2011  . Osteoarthritis of right acromioclavicular joint   . Pancreatitis 11/08/2011  . Rotator cuff tear, right   . Shortness of breath on  exertion 01/2020  . Sleep apnea    Can not afford Cpap machine  . Ventral hernia     Past Surgical History:  Procedure Laterality Date  . ORTHOPEDIC SURGERY     R ankle, tendonitis     Current Outpatient Medications  Medication Sig Dispense Refill  . albuterol (VENTOLIN HFA) 108 (90 Base) MCG/ACT inhaler Inhale 2 puffs into the lungs every 4 (four) hours as needed for wheezing or shortness of breath. 8 g 12  . cetirizine (ZYRTEC) 10 MG tablet Take 1 tablet (10 mg total) by mouth in the morning. 30 tablet 11  . docusate sodium (COLACE) 100 MG capsule Take 1 capsule (100 mg total) by mouth 3 (three) times daily as needed. 30 capsule 3  . fluticasone (FLONASE) 50 MCG/ACT nasal spray Place 1 spray into both nostrils daily. 16 g 3  . furosemide (LASIX) 40 MG tablet Take 1 tablet (40 mg total) by mouth 2 (two) times daily. 60 tablet 6  . gabapentin (NEURONTIN) 300 MG capsule TAKE 1 CAPSULE (300 MG TOTAL) BY MOUTH 3 (THREE) TIMES DAILY. 90 capsule 3  . glucose monitoring kit (FREESTYLE) monitoring kit 1 each by Does not apply route as needed for other. 1 each 0  . insulin aspart (NOVOLOG) 100 UNIT/ML injection Inject 0-5 Units into the skin at bedtime. 10 mL 11  . insulin aspart protamine- aspart (NOVOLOG MIX 70/30) (70-30) 100 UNIT/ML injection Inject 0.2 mLs (20 Units total) into the skin 2 (two) times daily with a meal. 10 mL 12  . ipratropium-albuterol (DUONEB) 0.5-2.5 (3) MG/3ML SOLN Take 3 mLs by nebulization every 6 (six) hours as needed (shortness of breath, wheeze). 360 mL 12  . losartan (COZAAR) 50 MG tablet Take 1 tablet (50 mg total) by mouth daily. 30 tablet 6  . methocarbamol (ROBAXIN) 750 MG tablet Take 1 tablet (750 mg total) by mouth 3 (three) times daily. 90 tablet 3  . Misc. Devices (BARIATRIC ROLLATOR) MISC 1 Device by Does not apply route daily. 1 each 0  . montelukast (SINGULAIR) 10 MG tablet Take 1 tablet (10 mg total) by mouth at bedtime. 30 tablet 3  . pantoprazole  (PROTONIX) 40 MG tablet Take 1 tablet (40 mg total) by mouth 2 (two) times daily before a meal. 60 tablet 6  . spironolactone (ALDACTONE) 25 MG tablet Take 1 tablet (25 mg total) by mouth daily. 30 tablet 1  . varenicline (CHANTIX) 0.5 MG tablet Take 1 tablet (0.5 mg total) by mouth 2 (two) times daily. 60 tablet 3   No current facility-administered medications for this visit.    Allergies:   Aspirin    Social History:  The patient  reports that she has been smoking cigarettes. She started smoking about 39 years ago. She has a 19.50 pack-year smoking history. She has never used smokeless tobacco. She reports that she does not drink alcohol and does not use drugs.   Family History:  The patient's family history includes Diabetes in an other family member; Hypertension in her mother.  ROS:  Please see the history of present illness.   Otherwise, review of systems are positive for LE edema.   All other systems are reviewed and negative.    PHYSICAL EXAM: VS:  BP (!) 156/98   Pulse (!) 107 Comment: elevated dur to DOB  Ht _0  (1.702 m)   Wt (!) 315 lb 9.6 oz (143.2 kg)   SpO2 93%   BMI 49.43 kg/m  , BMI Body mass index is 49.43 kg/m. GEN: Well nourished, well developed, in no acute distress  HEENT: normal  Neck: no JVD, carotid bruits, or masses Cardiac: RRR; no murmurs, rubs, or gallops,no edema  Respiratory:  clear to auscultation bilaterally, normal work of breathing GI: soft, nontender, nondistended, + BS MS: no deformity or atrophy  Skin: warm and dry, no rash Neuro:  Strength and sensation are intact Psych: euthymic mood, full affect   EKG:   The ekg ordered in November 2020 demonstrates sinus tachycardia   Recent Labs: 08/02/2019: B Natriuretic Peptide 119.1 08/11/2019: Magnesium 2.0 03/01/2020: ALT 32; BUN 16; Creatinine, Ser 0.58; Hemoglobin 14.5; Platelets 173.0; Potassium 4.1; Pro B Natriuretic peptide (BNP) 33.0; Sodium 136   Lipid Panel    Component Value  Date/Time   CHOL 197 11/07/2011 0341   TRIG 99 11/07/2011 0341   HDL 70 11/07/2011 0341   CHOLHDL 2.8 11/07/2011 0341   VLDL 20 11/07/2011 0341   LDLCALC 107 (H) 11/07/2011 0341     Other studies Reviewed: Additional studies/ records that were reviewed today with results demonstrating: Labs reviewed, normal renal function.   ASSESSMENT AND PLAN:  1. Chronic diastolic heart failure: Mildly volume overloaded.  Increase Lasix to 80 mg twice daily for 3 days.  Then go back to 40 mg twice daily.  Will check electrolytes next week. 2. Hypertensive heart disease: Moderate LVH noted on her echocardiogram.  Start carvedilol 6.25 mg twice daily.  If her blood pressure is still up at her next visit, would increase losartan.  Follow-up in the hypertension Pharm.D. clinic. 3. Tobacco abuse: She needs to stop smoking. 4. Morbid obesity: Whole food, plant-based diet recommended. 5. Lower extremity edema: Elevate her legs.  Hopefully, the additional diuretic will help.   Current medicines are reviewed at length with the patient today.  The patient concerns regarding her medicines were addressed.  The following changes have been made:  No change  Labs/ tests ordered today include:  No orders of the defined types were placed in this encounter.   Recommend 150 minutes/week of aerobic exercise Low fat, low carb, high fiber diet recommended  Disposition:   FU in 1 week   Signed, Larae Grooms, MD  03/19/2020 4:03 PM    [facility] [address], [place], [place]  [address] Phone: [phone]; Fax: [phone]

## 2020-03-22 MED FILL — LOSARTAN POTASSIUM 50 MG TA: 50 | 30 days supply | Qty: 30 | Fill #1

## 2020-03-22 MED FILL — ?FUROSEMIDE 4OMG TABLETS: 40 | 30 days supply | Qty: 60 | Fill #0

## 2020-03-22 MED FILL — ?PANTOPRAZOLE SO DR 40MG TA: 40 | 30 days supply | Qty: 60 | Fill #0

## 2020-03-25 ENCOUNTER — Other Ambulatory Visit: Payer: Self-pay | Admitting: *Deleted

## 2020-03-25 ENCOUNTER — Ambulatory Visit (INDEPENDENT_AMBULATORY_CARE_PROVIDER_SITE_OTHER): Payer: Self-pay | Admitting: Pharmacist

## 2020-03-25 ENCOUNTER — Encounter: Payer: Self-pay | Admitting: Pharmacist

## 2020-03-25 ENCOUNTER — Other Ambulatory Visit: Payer: Self-pay

## 2020-03-25 VITALS — BP 138/88 | HR 88 | Wt 312.6 lb

## 2020-03-25 DIAGNOSIS — I110 Hypertensive heart disease with heart failure: Secondary | ICD-10-CM

## 2020-03-25 DIAGNOSIS — I5032 Chronic diastolic (congestive) heart failure: Secondary | ICD-10-CM

## 2020-03-25 NOTE — Patient Instructions (Addendum)
It was nice meeting you today!  Your blood pressure goal is less than 130/80  Continue your carvedilol 6.25 mg twice daily, furosemide 40 mg twice daily, and spironolactone 25 mg daily  After your bloodwork comes back I will call you and we will discuss discontinuing the losartan 50 mg and starting losartan 100 mg daily  Ask your lung doctor about inhalers for your COPD  I will see you in 1 month!  Karren Cobble, PharmD, BCACP, [place] [address], [place], [place] [address] Phone: [phone]; Fax: [phone] 03/25/2020 3:49 PM

## 2020-03-25 NOTE — Progress Notes (Signed)
Patient ID: Sharon Swanson                 DOB: Sep 19, 1961                      MRN: [id number]     HPI: Sharon Swanson is a 59 y.o. female referred by Dr. Irish Swanson to HTN clinic. PMH is significant for CHF, HTN, smoking, obesity, T2DM with microalbuminuria, COPD, and lower extremity edema.  Presents today with walker and on 3L of oxygen.    Seen on 6/26 by Dr Sharon Swanson for edema and Lasix was increased to 80 mg BID for 3 days.  Has been urinating often and lost 3#, however legs are still swollen and she is uncomfortable.  Reports difficulty breathing, shortness of breath, and difficulty walking short and long distances.  Has been checking BP at home but did not bring log or machine.  Reports readings are high.  Is not checking blood sugar at home.  Has cut down to two cigarettes a day and is not adding any salt to her food.  Has applied for Medicare and becomes active on 04/25/20.  Interested in gastric bypass surgery.  Current HTN meds:  Carvedilol 6.75m BID, furosemide 40 mg daily, losartan 50 mg daily, spironolactone 25 mg daily  Previously tried: amlodipine 10 mg daily, carvedilol 12.5 mg BID, clonidine 0.1 mg BID, hydralazine 50 mg TID, lisinopril 2.5 mg daily, losartan/hctz 100/25 daily, verapamil 240 mg, verapamil 360 mg  BP goal: <130/80  Social History: smoking 2 cigarettes a day  Diet: Reports has weaned herself off of salt  Exercise: unable to exercsie  Wt Readings from Last 3 Encounters:  03/19/20 (!) 315 lb 9.6 oz (143.2 kg)  03/01/20 (!) 314 lb (142.4 kg)  02/25/20 (!) 314 lb 3.2 oz (142.5 kg)   BP Readings from Last 3 Encounters:  03/19/20 (!) 156/98  03/01/20 138/84  02/25/20 (!) 153/78   Pulse Readings from Last 3 Encounters:  03/19/20 (!) 107  03/01/20 88  02/25/20 91    Renal function: CrCl cannot be calculated (Patient's most recent lab result is older than the maximum 21 days allowed.).  Past Medical History:  Diagnosis Date  . Acid reflux   . Asthma   .  Bilateral lower extremity edema 01/2020  . COPD [place] obstructive pulmonary disease) [place]   . CPAP (continuous positive airway pressure) dependence   . Diabetes ([place]   . Diabetes mellitus without complication ([place]    Type II  . Fatty liver 11/08/2011  . Fibroids 11/08/2011  . Hyperglycemia   . Hypertension   . Microalbuminuria 01/2020  . Obesity (BMI 30-39.9) 11/08/2011  . Osteoarthritis of right acromioclavicular joint   . Pancreatitis 11/08/2011  . Rotator cuff tear, right   . Shortness of breath on exertion 01/2020  . Sleep apnea    Can not afford Cpap machine  . Ventral hernia     Current Outpatient Medications on File Prior to Visit  Medication Sig Dispense Refill  . albuterol (VENTOLIN HFA) 108 (90 Base) MCG/ACT inhaler Inhale 2 puffs into the lungs every 4 (four) hours as needed for wheezing or shortness of breath. 8 g 12  . carvedilol (COREG) 6.25 MG tablet Take 1 tablet (6.25 mg total) by mouth 2 (two) times daily. 180 tablet 3  . cetirizine (ZYRTEC) 10 MG tablet Take 1 tablet (10 mg total) by mouth in the morning. 30 tablet 11  . docusate sodium (COLACE) 100 MG  capsule Take 1 capsule (100 mg total) by mouth 3 (three) times daily as needed. 30 capsule 3  . fluticasone (FLONASE) 50 MCG/ACT nasal spray Place 1 spray into both nostrils daily. 16 g 3  . furosemide (LASIX) 40 MG tablet Take 89m twice daily for 3 days then take 432mtwice daily. 70 tablet 0  . gabapentin (NEURONTIN) 300 MG capsule TAKE 1 CAPSULE (300 MG TOTAL) BY MOUTH 3 (THREE) TIMES DAILY. 90 capsule 3  . glucose monitoring kit (FREESTYLE) monitoring kit 1 each by Does not apply route as needed for other. 1 each 0  . insulin aspart (NOVOLOG) 100 UNIT/ML injection Inject 0-5 Units into the skin at bedtime. 10 mL 11  . insulin aspart protamine- aspart (NOVOLOG MIX 70/30) (70-30) 100 UNIT/ML injection Inject 0.2 mLs (20 Units total) into the skin 2 (two) times daily with a meal. 10 mL 12  . ipratropium-albuterol  (DUONEB) 0.5-2.5 (3) MG/3ML SOLN Take 3 mLs by nebulization every 6 (six) hours as needed (shortness of breath, wheeze). 360 mL 12  . losartan (COZAAR) 50 MG tablet Take 1 tablet (50 mg total) by mouth daily. 30 tablet 6  . methocarbamol (ROBAXIN) 750 MG tablet Take 1 tablet (750 mg total) by mouth 3 (three) times daily. 90 tablet 3  . Misc. Devices (BARIATRIC ROLLATOR) MISC 1 Device by Does not apply route daily. 1 each 0  . montelukast (SINGULAIR) 10 MG tablet Take 1 tablet (10 mg total) by mouth at bedtime. 30 tablet 3  . pantoprazole (PROTONIX) 40 MG tablet Take 1 tablet (40 mg total) by mouth 2 (two) times daily before a meal. 60 tablet 6  . spironolactone (ALDACTONE) 25 MG tablet Take 1 tablet (25 mg total) by mouth daily. 30 tablet 1  . varenicline (CHANTIX) 0.5 MG tablet Take 1 tablet (0.5 mg total) by mouth 2 (two) times daily. 60 tablet 3   No current facility-administered medications on file prior to visit.    Allergies  Allergen Reactions  . Aspirin Other (See Comments)    Abdominal pain     Assessment/Plan:  1. Hypertension/CHF - BP today 138/88 which is above goal of <130/80.  Patient's legs remain swollen despite 80 mg of furosemide twice a day for last three days.  She is having bloodwork drawn after appointment.  Patient SOB likely a combination of CHF and COPD.  Recommended she speak with pulmonologist regarding maintenance COPD treatments.   -Pending bloodwork results, will increase losartan to 100 mg daily and encouraged patient to continue a heart healthy diet.  -Follow up scheduled in 1 month to consider Entresto and farxiga.  -Continue carvedilol 6.2559mID, furosemide 40 mg BID, and spironolactone 25 mg daily  ChrKarren CobbleharmD, BCACP, [id number] [id number]: [phone]: [phone]/2021 4:18 PM

## 2020-03-26 ENCOUNTER — Telehealth: Payer: Self-pay | Admitting: Interventional Cardiology

## 2020-03-26 DIAGNOSIS — I5032 Chronic diastolic (congestive) heart failure: Secondary | ICD-10-CM

## 2020-03-26 LAB — BASIC METABOLIC PANEL
BUN/Creatinine Ratio: 23 (ref 9–23)
BUN: 18 mg/dL (ref 6–24)
CO2: 28 mmol/L (ref 20–29)
Calcium: 9.8 mg/dL (ref 8.7–10.2)
Chloride: 96 mmol/L (ref 96–106)
Creatinine, Ser: 0.79 mg/dL (ref 0.57–1.00)
GFR calc Af Amer: 95 mL/min/{1.73_m2} (ref >59)
GFR calc non Af Amer: 82 mL/min/{1.73_m2} (ref >59)
Glucose: 309 mg/dL — ABNORMAL HIGH (ref 65–99)
Potassium: 4.6 mmol/L (ref 3.5–5.2)
Sodium: 139 mmol/L (ref 134–144)

## 2020-03-26 NOTE — Telephone Encounter (Signed)
    Went to chart to check who called pt. Adv it's Rollen Sox and per his notes will call her back next week

## 2020-03-30 MED ORDER — LOSARTAN POTASSIUM 100 MG PO TABS: 100.0000 mg | ORAL_TABLET | Freq: Every day | ORAL | 1 refills | Status: DC

## 2020-03-30 MED FILL — NOVOLOG MIX 70/30 VIAL: (70-30) 100 | 25 days supply | Qty: 10 | Fill #4

## 2020-03-30 NOTE — Addendum Note (Signed)
Addended by: Rollen Sox on: 03/30/2020 01:15 PM   Modules accepted: Orders

## 2020-03-30 NOTE — Telephone Encounter (Signed)
Spoke with patient.  Advised electrolytes and renal function wnl.  Increasing losartan to 100 mg and sending to pharmacy.

## 2020-04-01 MED FILL — LOSARTAN POTASSIUM 100 MG T: 100 | 30 days supply | Qty: 30 | Fill #0

## 2020-04-06 MED FILL — ?SPIRONOLACTONE 25 MG TABLE: 25 | 30 days supply | Qty: 30 | Fill #1

## 2020-04-08 ENCOUNTER — Encounter: Payer: Self-pay | Admitting: Pulmonary Disease

## 2020-04-08 ENCOUNTER — Ambulatory Visit (INDEPENDENT_AMBULATORY_CARE_PROVIDER_SITE_OTHER): Payer: Self-pay | Admitting: Pulmonary Disease

## 2020-04-08 ENCOUNTER — Other Ambulatory Visit: Payer: Self-pay

## 2020-04-08 VITALS — BP 126/74 | HR 111 | Temp 98.0°F | Ht 67.0 in | Wt 312.8 lb

## 2020-04-08 DIAGNOSIS — G4733 Obstructive sleep apnea (adult) (pediatric): Secondary | ICD-10-CM

## 2020-04-08 DIAGNOSIS — Z720 Tobacco use: Secondary | ICD-10-CM

## 2020-04-08 DIAGNOSIS — J449 Chronic obstructive pulmonary disease, unspecified: Secondary | ICD-10-CM

## 2020-04-08 MED ORDER — VARENICLINE TARTRATE 0.5 MG PO TABS: 0.5000 mg | ORAL_TABLET | Freq: Two times a day (BID) | ORAL | 3 refills | Status: DC

## 2020-04-08 MED ORDER — NICOTINE 21 MG/24HR TD PT24: 21.0000 mg | MEDICATED_PATCH | Freq: Every day | TRANSDERMAL | 0 refills | Status: DC

## 2020-04-08 MED ORDER — STIOLTO RESPIMAT 2.5-2.5 MCG/ACT IN AERS
2.0000 | INHALATION_SPRAY | Freq: Every day | RESPIRATORY_TRACT | 0 refills | Status: DC
Start: 2020-04-08 — End: 2020-05-26

## 2020-04-08 MED FILL — NICOTINE 21 MG/24HR PATCH: 21 | 28 days supply | Qty: 28 | Fill #0

## 2020-04-08 NOTE — Progress Notes (Signed)
Sharon Swanson    [id number]    [birth year]  Primary Care Physician:Stroud, Ellie Lunch, FNP  Referring Physician: Azzie Glatter, FNP [place],  [place] [address]  Problem list: Follow up for COPD OSA Diastolic heart failure  Obesity Active smoker  HPI: 59 y/o morbidly obese female, smoker (1/2 ppd for 35 years, began at age 47) with PMH of HTN, HLD, DM, fatty liver, fibroids, pancreatitis, ventral hernia childhood asthma & GERD who was admitted to [facility] on 06/04/16 with RLL CAP, COPD exacerbation.  Interim History: Seen by me in clinic after gap of 3 years.  Last seen by Wyn Quaker, NP earlier this year. She was given Breo inhaler but had to stop due to thrush No follow-up with cardiology for management of diastolic heart failure. Still struggling with smoking cessation continues to smoke 2 cigarettes/day  Has enrolled in weight loss clinic at [facility]  Outpatient Encounter Medications as of 04/08/2020  Medication Sig  . albuterol (VENTOLIN HFA) 108 (90 Base) MCG/ACT inhaler Inhale 2 puffs into the lungs every 4 (four) hours as needed for wheezing or shortness of breath.  . carvedilol (COREG) 6.25 MG tablet Take 1 tablet (6.25 mg total) by mouth 2 (two) times daily.  . cetirizine (ZYRTEC) 10 MG tablet Take 1 tablet (10 mg total) by mouth in the morning.  . docusate sodium (COLACE) 100 MG capsule Take 1 capsule (100 mg total) by mouth 3 (three) times daily as needed.  . fluticasone (FLONASE) 50 MCG/ACT nasal spray Place 1 spray into both nostrils daily.  . furosemide (LASIX) 40 MG tablet Take 39m twice daily for 3 days then take 466mtwice daily.  . Marland Kitchenabapentin (NEURONTIN) 300 MG capsule TAKE 1 CAPSULE (300 MG TOTAL) BY MOUTH 3 (THREE) TIMES DAILY.  . Marland Kitchenlucose monitoring kit (FREESTYLE) monitoring kit 1 each by Does not apply route as needed for other.  . insulin aspart (NOVOLOG) 100 UNIT/ML injection Inject 0-5 Units into the skin at bedtime.  . insulin  aspart protamine- aspart (NOVOLOG MIX 70/30) (70-30) 100 UNIT/ML injection Inject 0.2 mLs (20 Units total) into the skin 2 (two) times daily with a meal.  . ipratropium-albuterol (DUONEB) 0.5-2.5 (3) MG/3ML SOLN Take 3 mLs by nebulization every 6 (six) hours as needed (shortness of breath, wheeze).  . Marland Kitchenosartan (COZAAR) 100 MG tablet Take 1 tablet (100 mg total) by mouth daily.  . methocarbamol (ROBAXIN) 750 MG tablet Take 1 tablet (750 mg total) by mouth 3 (three) times daily.  . Misc. Devices (BARIATRIC ROLLATOR) MISC 1 Device by Does not apply route daily.  . montelukast (SINGULAIR) 10 MG tablet Take 1 tablet (10 mg total) by mouth at bedtime.  . pantoprazole (PROTONIX) 40 MG tablet Take 1 tablet (40 mg total) by mouth 2 (two) times daily before a meal.  . spironolactone (ALDACTONE) 25 MG tablet Take 1 tablet (25 mg total) by mouth daily.  . varenicline (CHANTIX) 0.5 MG tablet Take 1 tablet (0.5 mg total) by mouth 2 (two) times daily.   No facility-administered encounter medications on file as of 04/08/2020.    Physical Exam: Blood pressure 126/74, pulse (!) 111, temperature 98 F (36.7 C), temperature source Oral, height 5' 7"  (1.702 m), weight (!) 312 lb 12.8 oz (141.9 kg), SpO2 97 %. Gen:      No acute distress HEENT:  EOMI, sclera anicteric Neck:     No masses; no thyromegaly Lungs:    Clear to  auscultation bilaterally; normal respiratory effort CV:         Regular rate and rhythm; no murmurs Abd:      + bowel sounds; soft, non-tender; no palpable masses, no distension Ext:    No edema; adequate peripheral perfusion Skin:      Warm and dry; no rash Neuro: alert and oriented x 3 Psych: normal mood and affect  Data Reviewed: Imaging: CT chest 08/17/14- multifocal groundglass opacities, left upper lobe nodule or changes. CXR 06/04/16-right lower lobe opacity suggestive of pneumonia. Echo 06/05/16- moderate LV concentric hypertrophy, normal systolic function, LVEF 29-02%, no RWMA, grade  1 diastolic dysfunction CT scan 06/29/16-resolution of consolidation and lung opacities, mild bronchiectasis in the right middle lobe. Suggestion of airtrapping CTA 08/02/2019-bandlike scarring, atelectasis at the lung base.  Sleep study 06/16/16 Moderate OSA, AHI 18 Severe O2 desaturations.  PFTs 12/20/16 FVC 1.26 (40%) FEV1 1.19 (48%) F/F 95 TLC 58% DLCO 63%, corrected DLCO 116% Moderate restriction with diffusion defect.  FENO 04/09/16- < 5  Assessment:   Dyspnea, COPD Dyspnea is multifactorial secondary to obesity, diastolic heart failure, COPD She is a diagnosis of childhood asthma but lung function tests do not show any obstruction. There is a suggestion of small airway disease given air trapping on CT scan and reduction in mid flow rates.   The PFTs also show restriction with DLCO impairment. However the DLCO corrects for alveolar volume. There is no evidence of interstitial lung disease on CT scan. I believe the PFT abnormalities are secondary to obesity and body habitus which are contributing to dyspnea..  Has not tolerated inhaled corticosteroid inhaler due to thrush.  We will switch her to LABA/LAMA with Stiolto. Repeat PFTs   OSA Moderate OSA with desaturation.  Noncompliant with CPAP due to issues with mask DME order for mask fitting  Diastolic heart failure Follows with cardiology.  Continues on diuretics, hypertension medications  Morbid obesity Weight loss discussed with patient.  She has enrolled in [facility] program at [facility]  Active smoker. Smoking cessation discussed.  Encouraged to quit smoking Prescribe nicotine, Chantix.  Time spent counseling deformities Reassess at return visit  Refer for low-dose screening CT chest  Plan/Recommendations: Start Stiolto CPAP mask fitting Weight loss Chantix, nicotine patches Screening CT chest  Marshell Garfinkel MD [facility] 04/08/2020, 8:40 AM  CC: Azzie Glatter, FNP

## 2020-04-08 NOTE — Addendum Note (Signed)
Addended by: Elton Sin on: 04/08/2020 03:54 PM   Modules accepted: Orders

## 2020-04-08 NOTE — Patient Instructions (Signed)
We will start you on new inhaler called Stiolto which does not have a side effect of thrush Remember to wash her mouth after every use  We will place an order to DME company for CPAP mask fitting Prescribe nicotine patches and Chantix for smoking cessation Continue with weight loss  Follow-up in 3 to 6 months.

## 2020-04-14 ENCOUNTER — Encounter (HOSPITAL_COMMUNITY): Payer: Self-pay

## 2020-04-14 ENCOUNTER — Other Ambulatory Visit: Payer: Self-pay

## 2020-04-14 DIAGNOSIS — F17210 Nicotine dependence, cigarettes, uncomplicated: Secondary | ICD-10-CM | POA: Insufficient documentation

## 2020-04-14 DIAGNOSIS — Z20822 Contact with and (suspected) exposure to covid-19: Secondary | ICD-10-CM | POA: Insufficient documentation

## 2020-04-14 DIAGNOSIS — J45909 Unspecified asthma, uncomplicated: Secondary | ICD-10-CM | POA: Insufficient documentation

## 2020-04-14 DIAGNOSIS — J449 Chronic obstructive pulmonary disease, unspecified: Secondary | ICD-10-CM | POA: Insufficient documentation

## 2020-04-14 DIAGNOSIS — Z79899 Other long term (current) drug therapy: Secondary | ICD-10-CM | POA: Insufficient documentation

## 2020-04-14 DIAGNOSIS — Z794 Long term (current) use of insulin: Secondary | ICD-10-CM | POA: Insufficient documentation

## 2020-04-14 DIAGNOSIS — K429 Umbilical hernia without obstruction or gangrene: Secondary | ICD-10-CM | POA: Insufficient documentation

## 2020-04-14 DIAGNOSIS — I10 Essential (primary) hypertension: Secondary | ICD-10-CM | POA: Insufficient documentation

## 2020-04-14 DIAGNOSIS — E119 Type 2 diabetes mellitus without complications: Secondary | ICD-10-CM | POA: Insufficient documentation

## 2020-04-14 NOTE — ED Triage Notes (Signed)
Pt complains of abdominal pain for three hours, denies vomiting or diarrhea Pt has hx or a hernia

## 2020-04-15 ENCOUNTER — Encounter (HOSPITAL_COMMUNITY): Payer: Self-pay | Admitting: Radiology

## 2020-04-15 ENCOUNTER — Emergency Department (HOSPITAL_COMMUNITY): Payer: Self-pay

## 2020-04-15 ENCOUNTER — Emergency Department (HOSPITAL_COMMUNITY)
Admission: EM | Admit: 2020-04-15 | Discharge: 2020-04-15 | Disposition: A | Discharge status: Home / Self Care | Payer: Self-pay | Attending: Emergency Medicine | Admitting: Emergency Medicine

## 2020-04-15 DIAGNOSIS — K429 Umbilical hernia without obstruction or gangrene: Secondary | ICD-10-CM

## 2020-04-15 LAB — URINALYSIS, ROUTINE W REFLEX MICROSCOPIC
Bilirubin Urine: NEGATIVE
Glucose, UA: >=500 mg/dL — AB
Hgb urine dipstick: NEGATIVE
Ketones, ur: NEGATIVE mg/dL
Nitrite: NEGATIVE
Protein, ur: 30 mg/dL — AB
Specific Gravity, Urine: 1.012 (ref 1.005–1.030)
Squamous Epithelial / HPF: >50 — ABNORMAL HIGH (ref 0–5)
pH: 6 (ref 5.0–8.0)

## 2020-04-15 LAB — COMPREHENSIVE METABOLIC PANEL
ALT: 37 U/L (ref 0–44)
AST: 18 U/L (ref 15–41)
Albumin: 3.9 g/dL (ref 3.5–5.0)
Alkaline Phosphatase: 76 U/L (ref 38–126)
Anion gap: 14 (ref 5–15)
BUN: 33 mg/dL — ABNORMAL HIGH (ref 6–20)
CO2: 32 mmol/L (ref 22–32)
Calcium: 9.5 mg/dL (ref 8.9–10.3)
Chloride: 91 mmol/L — ABNORMAL LOW (ref 98–111)
Creatinine, Ser: 0.92 mg/dL (ref 0.44–1.00)
GFR calc Af Amer: >60 mL/min (ref >60)
GFR calc non Af Amer: >60 mL/min (ref >60)
Glucose, Bld: 278 mg/dL — ABNORMAL HIGH (ref 70–99)
Potassium: 4.8 mmol/L (ref 3.5–5.1)
Sodium: 137 mmol/L (ref 135–145)
Total Bilirubin: 0.7 mg/dL (ref 0.3–1.2)
Total Protein: 8.1 g/dL (ref 6.5–8.1)

## 2020-04-15 LAB — CBC WITH DIFFERENTIAL/PLATELET
Abs Immature Granulocytes: 0.02 10*3/uL (ref 0.00–0.07)
Basophils Absolute: 0 10*3/uL (ref 0.0–0.1)
Basophils Relative: 0 %
Eosinophils Absolute: 0 10*3/uL (ref 0.0–0.5)
Eosinophils Relative: 1 %
HCT: 46.5 % — ABNORMAL HIGH (ref 36.0–46.0)
Hemoglobin: 14.4 g/dL (ref 12.0–15.0)
Immature Granulocytes: 0 %
Lymphocytes Relative: 16 %
Lymphs Abs: 1.2 10*3/uL (ref 0.7–4.0)
MCH: 28.6 pg (ref 26.0–34.0)
MCHC: 31 g/dL (ref 30.0–36.0)
MCV: 92.3 fL (ref 80.0–100.0)
Monocytes Absolute: 0.6 10*3/uL (ref 0.1–1.0)
Monocytes Relative: 8 %
Neutro Abs: 5.4 10*3/uL (ref 1.7–7.7)
Neutrophils Relative %: 75 %
Platelets: 202 10*3/uL (ref 150–400)
RBC: 5.04 MIL/uL (ref 3.87–5.11)
RDW: 14.6 % (ref 11.5–15.5)
WBC: 7.2 10*3/uL (ref 4.0–10.5)
nRBC: 0 % (ref 0.0–0.2)

## 2020-04-15 LAB — SARS CORONAVIRUS 2 BY RT PCR (HOSPITAL ORDER, PERFORMED IN ~~LOC~~ HOSPITAL LAB): SARS Coronavirus 2: NEGATIVE

## 2020-04-15 MED ORDER — LORAZEPAM 2 MG/ML IJ SOLN
0.5000 mg | Freq: Once | INTRAMUSCULAR | Status: AC
  Administered 2020-04-15: 0.5 mg via INTRAVENOUS
  Filled 2020-04-15: qty 1

## 2020-04-15 MED ORDER — SODIUM CHLORIDE (PF) 0.9 % IJ SOLN
INTRAMUSCULAR | Status: AC
  Filled 2020-04-15: qty 50

## 2020-04-15 MED ORDER — HYDROCODONE-ACETAMINOPHEN 5-325 MG PO TABS: 1.0000 | ORAL_TABLET | Freq: Four times a day (QID) | ORAL | 0 refills | Status: DC | PRN

## 2020-04-15 MED ORDER — LORAZEPAM 2 MG/ML IJ SOLN: 0.5000 mg | Freq: Once | INTRAMUSCULAR | Status: DC

## 2020-04-15 MED ORDER — HYDROMORPHONE HCL 1 MG/ML IJ SOLN
1.0000 mg | Freq: Once | INTRAMUSCULAR | Status: AC
  Administered 2020-04-15: 1 mg via INTRAVENOUS
  Filled 2020-04-15: qty 1

## 2020-04-15 MED ORDER — SODIUM CHLORIDE 0.9 % IV BOLUS
1000.0000 mL | Freq: Once | INTRAVENOUS | Status: AC
  Administered 2020-04-15: 1000 mL via INTRAVENOUS

## 2020-04-15 MED ORDER — IOHEXOL 300 MG/ML  SOLN
100.0000 mL | Freq: Once | INTRAMUSCULAR | Status: AC | PRN
  Administered 2020-04-15: 100 mL via INTRAVENOUS

## 2020-04-15 MED ORDER — HYDROMORPHONE HCL 1 MG/ML IJ SOLN: 1.0000 mg | Freq: Once | INTRAMUSCULAR | Status: DC

## 2020-04-15 MED ORDER — CEPHALEXIN 500 MG PO CAPS
500.0000 mg | ORAL_CAPSULE | Freq: Four times a day (QID) | ORAL | 0 refills | Status: DC
Start: 2020-04-15 — End: 2020-06-28

## 2020-04-15 NOTE — ED Provider Notes (Signed)
[facility] DEPT Provider Note   CSN: [id number] Arrival date & time: 04/14/20  2236     History No chief complaint on file.   Sharon Swanson is a 59 y.o. female.  Patient complains of painful umbilical hernia.  She states that she has had this for years and she used to be able to reduce it and it would be fine now she reduces it and just returned immediately.  The history is provided by the patient and medical records. No language interpreter was used.  Abdominal Pain Pain location:  Periumbilical Pain quality: aching   Pain radiates to:  Does not radiate Pain severity:  Moderate Onset quality:  Sudden Timing:  Constant Progression:  Unchanged Chronicity:  New Context: not alcohol use   Relieved by:  Nothing Associated symptoms: no chest pain, no cough, no diarrhea, no fatigue and no hematuria        Past Medical History:  Diagnosis Date  . Acid reflux   . Asthma   . Bilateral lower extremity edema 01/2020  . COPD (chronic obstructive pulmonary disease) ([place])   . CPAP (continuous positive airway pressure) dependence   . Diabetes ([place])   . Diabetes mellitus without complication ([place])    Type II  . Fatty liver 11/08/2011  . Fibroids 11/08/2011  . Hyperglycemia   . Hypertension   . Microalbuminuria 01/2020  . Obesity (BMI 30-39.9) 11/08/2011  . Osteoarthritis of right acromioclavicular joint   . Pancreatitis 11/08/2011  . Rotator cuff tear, right   . Shortness of breath on exertion 01/2020  . Sleep apnea    Can not afford Cpap machine  . Ventral hernia     Patient Active Problem List   Diagnosis Date Noted  . Bilateral lower extremity edema 02/25/2020  . Shortness of breath on exertion 02/25/2020  . Dependence on continuous supplemental oxygen 02/25/2020  . Abdominal pain, acute, periumbilical 49/44/9675  . Hemoglobin A1C greater than 9%, indicating poor diabetic control 10/19/2019  . Class 3 severe obesity due to excess  calories with serious comorbidity and body mass index (BMI) of 45.0 to 49.9 in adult [facility]) 09/21/2019  . Hemoglobin A1C between 7% and 9% indicating borderline diabetic control 09/21/2019  . Chronic respiratory failure ([place]) 08/02/2019  . Diastolic dysfunction 91/63/8466  . Cellulitis 08/02/2019  . Insulin-requiring or dependent type II diabetes mellitus ([place]) 01/19/2017  . Essential hypertension 01/19/2017  . Anxiety 01/19/2017  . Chest pain 01/19/2017  . OSA (obstructive sleep apnea) 01/19/2017  . COPD (chronic obstructive pulmonary disease) ([place])   . Acute respiratory failure ([place]) 08/17/2014  . Umbilical hernia 59/93/5701  . Pulmonary nodule, left 08/17/2014  . Constipation, chronic 08/15/2012  . GERD (gastroesophageal reflux disease) 11/08/2011  . Tobacco abuse 11/08/2011  . Fatty liver 11/08/2011  . BMI 45.0-49.9, adult ([place]) 11/08/2011  . Fibroids 11/08/2011    Past Surgical History:  Procedure Laterality Date  . ORTHOPEDIC SURGERY     R ankle, tendonitis     OB History    Gravida  1   Para  0   Term  0   Preterm  0   AB  0   Living  0     SAB  0   TAB  0   Ectopic  0   Multiple  0   Live Births              Family History  Problem Relation Age of Onset  . Hypertension Mother   .  Diabetes Other     Social History   Tobacco Use  . Smoking status: Current Every Day Smoker    Packs/day: 0.50    Years: 39.00    Pack years: 19.50    Types: Cigarettes    Start date: 09/25/1980  . Smokeless tobacco: Never Used  . Tobacco comment: pt states she smoke 2 cigerettes a day  Vaping Use  . Vaping Use: Never used  Substance Use Topics  . Alcohol use: No  . Drug use: No    Home Medications Prior to Admission medications   Medication Sig Start Date End Date Taking? Authorizing Provider  albuterol (VENTOLIN HFA) 108 (90 Base) MCG/ACT inhaler Inhale 2 puffs into the lungs every 4 (four) hours as needed for wheezing or shortness of breath. 09/16/19    Azzie Glatter, FNP  carvedilol (COREG) 6.25 MG tablet Take 1 tablet (6.25 mg total) by mouth 2 (two) times daily. 03/19/20   Jettie Booze, MD  cephALEXin (KEFLEX) 500 MG capsule Take 1 capsule (500 mg total) by mouth 4 (four) times daily. 04/15/20   Milton Ferguson, MD  cetirizine (ZYRTEC) 10 MG tablet Take 1 tablet (10 mg total) by mouth in the morning. 01/19/20   Azzie Glatter, FNP  docusate sodium (COLACE) 100 MG capsule Take 1 capsule (100 mg total) by mouth 3 (three) times daily as needed. 09/16/19   Azzie Glatter, FNP  fluticasone (FLONASE) 50 MCG/ACT nasal spray Place 1 spray into both nostrils daily. 09/16/19   Azzie Glatter, FNP  furosemide (LASIX) 40 MG tablet Take 10m twice daily for 3 days then take 411mtwice daily. 03/19/20   VaJettie BoozeMD  gabapentin (NEURONTIN) 300 MG capsule TAKE 1 CAPSULE (300 MG TOTAL) BY MOUTH 3 (THREE) TIMES DAILY. 02/25/20   StAzzie GlatterFNP  glucose monitoring kit (FREESTYLE) monitoring kit 1 each by Does not apply route as needed for other. 09/16/19   StAzzie GlatterFNP  HYDROcodone-acetaminophen (NORCO/VICODIN) 5-325 MG tablet Take 1 tablet by mouth every 6 (six) hours as needed for moderate pain. 04/15/20   ZaMilton FergusonMD  insulin aspart (NOVOLOG) 100 UNIT/ML injection Inject 0-5 Units into the skin at bedtime. 09/16/19   StAzzie GlatterFNP  insulin aspart protamine- aspart (NOVOLOG MIX 70/30) (70-30) 100 UNIT/ML injection Inject 0.2 mLs (20 Units total) into the skin 2 (two) times daily with a meal. 09/16/19   StAzzie GlatterFNP  ipratropium-albuterol (DUONEB) 0.5-2.5 (3) MG/3ML SOLN Take 3 mLs by nebulization every 6 (six) hours as needed (shortness of breath, wheeze). 09/16/19 10/10/20  StAzzie GlatterFNP  losartan (COZAAR) 100 MG tablet Take 1 tablet (100 mg total) by mouth daily. 03/30/20 05/29/20  VaJettie BoozeMD  methocarbamol (ROBAXIN) 750 MG tablet Take 1 tablet (750 mg total) by mouth 3  (three) times daily. 02/18/20   StAzzie GlatterFNP  Misc. Devices (BARIATRIC ROLLATOR) MISC 1 Device by Does not apply route daily. 08/11/19   SeHarold HedgeMD  montelukast (SINGULAIR) 10 MG tablet Take 1 tablet (10 mg total) by mouth at bedtime. 01/19/20   StAzzie GlatterFNP  nicotine (NICODERM CQ - DOSED IN MG/24 HOURS) 21 mg/24hr patch Place 1 patch (21 mg total) onto the skin daily. 04/08/20   Mannam, PrHart RobinsonsMD  pantoprazole (PROTONIX) 40 MG tablet Take 1 tablet (40 mg total) by mouth 2 (two) times daily before a meal. 02/25/20 09/22/20  StAzzie GlatterFNP  spironolactone (ALDACTONE) 25 MG tablet Take 1 tablet (25 mg total) by mouth daily. 02/25/20   Azzie Glatter, FNP  Tiotropium Bromide-Olodaterol (STIOLTO RESPIMAT) 2.5-2.5 MCG/ACT AERS Inhale 2 puffs into the lungs daily. 04/08/20   Mannam, Hart Robinsons, MD  varenicline (CHANTIX) 0.5 MG tablet Take 1 tablet (0.5 mg total) by mouth 2 (two) times daily. 04/08/20   Marshell Garfinkel, MD    Allergies    Aspirin  Review of Systems   Review of Systems  Constitutional: Negative for appetite change and fatigue.  HENT: Negative for congestion, ear discharge and sinus pressure.   Eyes: Negative for discharge.  Respiratory: Negative for cough.   Cardiovascular: Negative for chest pain.  Gastrointestinal: Positive for abdominal pain. Negative for diarrhea.  Genitourinary: Negative for frequency and hematuria.  Musculoskeletal: Negative for back pain.  Skin: Negative for rash.  Neurological: Negative for seizures and headaches.  Psychiatric/Behavioral: Negative for hallucinations.    Physical Exam Updated Vital Signs BP (!) 149/69   Pulse 84   Temp 98.9 F (37.2 C) (Oral)   Resp 13   SpO2 95%   Physical Exam Vitals and nursing note reviewed.  Constitutional:      Appearance: She is well-developed.  HENT:     Head: Normocephalic.     Mouth/Throat:     Mouth: Mucous membranes are moist.  Eyes:     General: No scleral  icterus.    Conjunctiva/sclera: Conjunctivae normal.  Neck:     Thyroid: No thyromegaly.  Cardiovascular:     Rate and Rhythm: Normal rate and regular rhythm.     Heart sounds: No murmur heard.  No friction rub. No gallop.   Pulmonary:     Breath sounds: No stridor. No wheezing or rales.  Chest:     Chest wall: No tenderness.  Abdominal:     General: There is no distension.     Tenderness: There is no abdominal tenderness. There is no rebound.     Comments: Patient with tenderness to the umbilical hernia which also shows redness.  The hernia is easily reducible for comfort    Musculoskeletal:        General: Normal range of motion.     Cervical back: Neck supple.  Lymphadenopathy:     Cervical: No cervical adenopathy.  Skin:    Findings: No erythema or rash.  Neurological:     Mental Status: She is alert and oriented to person, place, and time.     Motor: No abnormal muscle tone.     Coordination: Coordination normal.  Psychiatric:        Behavior: Behavior normal.     ED Results / Procedures / Treatments   Labs (all labs ordered are listed, but only abnormal results are displayed) Labs Reviewed  CBC WITH DIFFERENTIAL/PLATELET - Abnormal; Notable for the following components:      Result Value   HCT 46.5 (*)    All other components within normal limits  COMPREHENSIVE METABOLIC PANEL - Abnormal; Notable for the following components:   Chloride 91 (*)    Glucose, Bld 278 (*)    BUN 33 (*)    All other components within normal limits  URINALYSIS, ROUTINE W REFLEX MICROSCOPIC - Abnormal; Notable for the following components:   APPearance CLOUDY (*)    Glucose, UA >=500 (*)    Protein, ur 30 (*)    Leukocytes,Ua SMALL (*)    Bacteria, UA MANY (*)    Squamous Epithelial / LPF >50 (*)  Non Squamous Epithelial 0-5 (*)    All other components within normal limits  SARS CORONAVIRUS 2 BY RT PCR (HOSPITAL ORDER, [place])     EKG None  Radiology CT ABDOMEN PELVIS W CONTRAST  Result Date: 04/15/2020 CLINICAL DATA:  Mid abdominal pain for several years. Pain became severe last night. EXAM: CT ABDOMEN AND PELVIS WITH CONTRAST TECHNIQUE: Multidetector CT imaging of the abdomen and pelvis was performed using the standard protocol following bolus administration of intravenous contrast. CONTRAST:  168m OMNIPAQUE IOHEXOL 300 MG/ML  SOLN COMPARISON:  CT scan from 2013. FINDINGS: Lower chest: Bibasilar atelectasis. No definite infiltrates or pulmonary lesions. No pleural effusion. The heart is mildly enlarged but overall stable. Coronary artery calcifications are noted. Hepatobiliary: Diffuse and fairly marked fatty infiltration of the liver but no focal hepatic lesions. The gallbladder is unremarkable. No common bile duct dilatation. Pancreas: No mass, inflammation or ductal dilatation. Spleen: Normal size. No worrisome lesions. Adrenals/Urinary Tract: The adrenal glands and kidneys are unremarkable. The bladder is unremarkable. Stomach/Bowel: The stomach, duodenum, small bowel and colon are unremarkable. No acute inflammatory changes, mass lesions or obstructive findings. The terminal ileum is normal. The appendix is normal. Vascular/Lymphatic: Moderate scattered atherosclerotic calcifications involving the aorta and branch vessels. No aneurysm. Small scattered mesenteric and retroperitoneal lymph nodes but no mass or adenopathy. Reproductive: Enlarged fibroid uterus is noted. The ovaries are normal Other: No pelvic mass or free pelvic fluid collections. No inguinal mass or adenopathy. There is a moderate-sized anterior abdominal wall hernia containing fat, vessels and a loop of small bowel. The fat demonstrates mild interstitial changes and there is a small amount of fluid. This is likely the source of the patient's abdominal pain. I do not see any evidence for bowel compromise. No pneumatosis or obstructive findings.  Musculoskeletal: No significant bony findings. IMPRESSION: 1. Moderate-sized periumbilical abdominal wall hernia containing fat, vessels and a loop of small bowel. The fat demonstrates mild interstitial changes and there is a small amount of fluid. This is likely the source of the patient's abdominal pain. I do not see any evidence for bowel compromise. 2. Diffuse and fairly marked fatty infiltration of the liver. 3. Enlarged fibroid uterus. Aortic Atherosclerosis (ICD10-I70.0). Electronically Signed   By: PMarijo SanesM.D.   On: 04/15/2020 11:25    Procedures Procedures (including critical care time)  Medications Ordered in ED Medications  sodium chloride (PF) 0.9 % injection (0 mLs  Hold 04/15/20 1131)  sodium chloride 0.9 % bolus 1,000 mL (0 mLs Intravenous Stopped 04/15/20 1107)  HYDROmorphone (DILAUDID) injection 1 mg (1 mg Intravenous Given 04/15/20 0955)  LORazepam (ATIVAN) injection 0.5 mg (0.5 mg Intravenous Given 04/15/20 0954)  iohexol (OMNIPAQUE) 300 MG/ML solution 100 mL (100 mLs Intravenous Contrast Given 04/15/20 1100)  LORazepam (ATIVAN) injection 0.5 mg (0.5 mg Intravenous Given 04/15/20 1139)  HYDROmorphone (DILAUDID) injection 1 mg (1 mg Intravenous Given 04/15/20 1139)    ED Course  I have reviewed the triage vital signs and the nursing notes.  Pertinent labs & imaging results that were available during my care of the patient were reviewed by me and considered in my medical decision making (see chart for details).    MDM Rules/Calculators/A&P                          Patient with inflamed umbilical hernia does not show any incarceration but does show some inflamed fat.  She will be  placed on Keflex and will follow up with general surgery.  General surgery was consulted and felt the patient could be followed up as an outpatient       This patient presents to the ED for concern  of abdominal pain this involves an extensive number of treatment options, and is a complaint  that carries with it a high risk of complications and morbidity.  The differential diagnosis includes hernia gastritis   Lab Tests:   I Ordered, reviewed, and interpreted labs, which included CBC chemistries which were unremarkable  Medicines ordered:   I ordered medication Dilaudid for pain  Imaging Studies ordered:   I ordered imaging studies which included CT of the abdomen with contrast and  I independently visualized and interpreted imaging which showed periumbilical hernia which showed no incarceration  Additional history obtained:   Additional history obtained from records  Previous records obtained and reviewed.  Consultations Obtained:   I consulted general surgery and discussed lab and imaging findings  Reevaluation:  After the interventions stated above, I reevaluated the patient and found improved  Critical Interventions:  .  Final Clinical Impression(s) / ED Diagnoses Final diagnoses:  Umbilical hernia without obstruction and without gangrene    Rx / DC Orders ED Discharge Orders         Ordered    cephALEXin (KEFLEX) 500 MG capsule  4 times daily     Discontinue  Reprint     04/15/20 1357    HYDROcodone-acetaminophen (NORCO/VICODIN) 5-325 MG tablet  Every 6 hours PRN     Discontinue  Reprint     04/15/20 1359           Milton Ferguson, MD 04/19/20 1707

## 2020-04-15 NOTE — ED Notes (Signed)
Pt to CT

## 2020-04-15 NOTE — ED Notes (Signed)
Pt is aware UA needed. Purewick is in place

## 2020-04-15 NOTE — Discharge Instructions (Signed)
Call make an appointment for [facility] surgery next week.  Return here if any problem

## 2020-04-16 MED FILL — GABAPENTIN 300 MG CAPSULE: 300 | 30 days supply | Qty: 90 | Fill #1

## 2020-04-16 MED FILL — METHOCARBAMOL 750 MG TABS: 750 | 30 days supply | Qty: 90 | Fill #1

## 2020-04-16 MED FILL — LOSARTAN POTASSIUM 50 MG TA: 50 | 30 days supply | Qty: 30 | Fill #2

## 2020-04-26 ENCOUNTER — Ambulatory Visit (INDEPENDENT_AMBULATORY_CARE_PROVIDER_SITE_OTHER): Payer: Medicare Other | Admitting: Pharmacist

## 2020-04-26 ENCOUNTER — Encounter: Payer: Self-pay | Admitting: Pharmacist

## 2020-04-26 ENCOUNTER — Other Ambulatory Visit: Payer: Self-pay

## 2020-04-26 VITALS — BP 142/84 | HR 82 | Wt 310.8 lb

## 2020-04-26 DIAGNOSIS — I5032 Chronic diastolic (congestive) heart failure: Secondary | ICD-10-CM | POA: Diagnosis not present

## 2020-04-26 DIAGNOSIS — I110 Hypertensive heart disease with heart failure: Secondary | ICD-10-CM

## 2020-04-26 MED ORDER — ENTRESTO 49-51 MG PO TABS: 1.0000 | ORAL_TABLET | Freq: Two times a day (BID) | ORAL | 0 refills | Status: DC

## 2020-04-26 NOTE — Patient Instructions (Signed)
It was good seeing you again!  Your blood pressure goal is less than 130/80  We are going to discontinue your losartan 100 mg and start you on Entresto 49/51  Please have your bloodwork recheck on 8/18  Call with any questions  Karren Cobble, PharmD, BCACP, [place] [address], [place], [place] [address] Phone: [phone]; Fax: [phone] 04/26/2020 9:09 AM

## 2020-04-26 NOTE — Progress Notes (Signed)
Patient ID: Sharon Swanson                 DOB: August 18, 1961                      MRN: [id number]     HPI: [name] is a 59 y.o. female referred by Dr. Irish Lack to HTN clinic. PMH is significant for HTN, COPD, DM, smoking, SOB, and CHF.  Seen 1 month ago at HTN clinic.  Legs were still swollen and patient still had SOB.  Losartan was increased to 100 mg once daily and patient had repeat labs which were WNL.    Reports today in good spirits.  Had pulmonology appointment who switched inhalers to [facility] and patient believes she is breathing better.  Still has swelling in her legs despite furosemide 40 mg daily.  Is smoking ~ 2 cigarettes a day.  Pulmonology prescribed Chantix but patient says the pharmacy never received Rx.  Has been using a nicotine patch.    Medicare became active yesterday 8/1.  Current HTN meds:  Carvedilol 6.6m BID, furosemide 40 mg daily, losartan 100 mg daily, spironolactone 25 mg daily  Previously tried: amlodipine 10 mg daily, carvedilol 12.5 mg BID, clonidine 0.1 mg BID, hydralazine 50 mg TID, lisinopril 2.5 mg daily, losartan/hctz 100/25 daily, verapamil 240 mg, verapamil 360 mg  BP goal: <130/80  Social History: smokes 2 cigarettes a dat   Wt Readings from Last 3 Encounters:  04/08/20 (!) 312 lb 12.8 oz (141.9 kg)  03/25/20 (!) 312 lb 9.6 oz (141.8 kg)  03/19/20 (!) 315 lb 9.6 oz (143.2 kg)   BP Readings from Last 3 Encounters:  04/15/20 (!) 149/69  04/08/20 126/74  03/25/20 138/88   Pulse Readings from Last 3 Encounters:  04/15/20 84  04/08/20 (!) 111  03/25/20 88    Renal function: CrCl cannot be calculated (Unknown ideal weight.).  Past Medical History:  Diagnosis Date   Acid reflux    Asthma    Bilateral lower extremity edema 01/2020   COPD (chronic obstructive pulmonary disease) (HCC)    CPAP (continuous positive airway pressure) dependence    Diabetes ([place]    Diabetes mellitus without complication ([place]    Type II    Fatty liver 11/08/2011   Fibroids 11/08/2011   Hyperglycemia    Hypertension    Microalbuminuria 01/2020   Obesity (BMI 30-39.9) 11/08/2011   Osteoarthritis of right acromioclavicular joint    Pancreatitis 11/08/2011   Rotator cuff tear, right    Shortness of breath on exertion 01/2020   Sleep apnea    Can not afford Cpap machine   Ventral hernia     Current Outpatient Medications on File Prior to Visit  Medication Sig Dispense Refill   albuterol (VENTOLIN HFA) 108 (90 Base) MCG/ACT inhaler Inhale 2 puffs into the lungs every 4 (four) hours as needed for wheezing or shortness of breath. 8 g 12   carvedilol (COREG) 6.25 MG tablet Take 1 tablet (6.25 mg total) by mouth 2 (two) times daily. 180 tablet 3   cephALEXin (KEFLEX) 500 MG capsule Take 1 capsule (500 mg total) by mouth 4 (four) times daily. 28 capsule 0   cetirizine (ZYRTEC) 10 MG tablet Take 1 tablet (10 mg total) by mouth in the morning. 30 tablet 11   docusate sodium (COLACE) 100 MG capsule Take 1 capsule (100 mg total) by mouth 3 (three) times daily as needed. 30 capsule 3   fluticasone (FLONASE) 50  MCG/ACT nasal spray Place 1 spray into both nostrils daily. 16 g 3   furosemide (LASIX) 40 MG tablet Take 93m twice daily for 3 days then take 425mtwice daily. 70 tablet 0   gabapentin (NEURONTIN) 300 MG capsule TAKE 1 CAPSULE (300 MG TOTAL) BY MOUTH 3 (THREE) TIMES DAILY. 90 capsule 3   glucose monitoring kit (FREESTYLE) monitoring kit 1 each by Does not apply route as needed for other. 1 each 0   HYDROcodone-acetaminophen (NORCO/VICODIN) 5-325 MG tablet Take 1 tablet by mouth every 6 (six) hours as needed for moderate pain. 20 tablet 0   insulin aspart (NOVOLOG) 100 UNIT/ML injection Inject 0-5 Units into the skin at bedtime. 10 mL 11   insulin aspart protamine- aspart (NOVOLOG MIX 70/30) (70-30) 100 UNIT/ML injection Inject 0.2 mLs (20 Units total) into the skin 2 (two) times daily with a meal. 10 mL 12    ipratropium-albuterol (DUONEB) 0.5-2.5 (3) MG/3ML SOLN Take 3 mLs by nebulization every 6 (six) hours as needed (shortness of breath, wheeze). 360 mL 12   losartan (COZAAR) 100 MG tablet Take 1 tablet (100 mg total) by mouth daily. 30 tablet 1   methocarbamol (ROBAXIN) 750 MG tablet Take 1 tablet (750 mg total) by mouth 3 (three) times daily. 90 tablet 3   Misc. Devices (BARIATRIC ROLLATOR) MISC 1 Device by Does not apply route daily. 1 each 0   montelukast (SINGULAIR) 10 MG tablet Take 1 tablet (10 mg total) by mouth at bedtime. 30 tablet 3   nicotine (NICODERM CQ - DOSED IN MG/24 HOURS) 21 mg/24hr patch Place 1 patch (21 mg total) onto the skin daily. 28 patch 0   pantoprazole (PROTONIX) 40 MG tablet Take 1 tablet (40 mg total) by mouth 2 (two) times daily before a meal. 60 tablet 6   spironolactone (ALDACTONE) 25 MG tablet Take 1 tablet (25 mg total) by mouth daily. 30 tablet 1   Tiotropium Bromide-Olodaterol (STIOLTO RESPIMAT) 2.5-2.5 MCG/ACT AERS Inhale 2 puffs into the lungs daily. 4 g 0   varenicline (CHANTIX) 0.5 MG tablet Take 1 tablet (0.5 mg total) by mouth 2 (two) times daily. 60 tablet 3   No current facility-administered medications on file prior to visit.    Allergies  Allergen Reactions   Aspirin Other (See Comments)    Abdominal pain     Assessment/Plan:  1. Hypertension/CHF - BP today 142/84 which is above goal of of <130/80.  Unclear what patient's home readings are because she does not remember and did not bring cuff.   Will switch from losartan 100 mg to Entresto 49/51 BID and advised patient to d/c losartan when she picks up new medication.   30 day trial coupon printed for patient and will recheck BMP in 2 weeks with goal of titrating up to max dose in 1 month.    Recommended patient double check with pharmacy regarding Chantix since it appears Rx was sent on 7/15.    Will follow up in 1 month to check BP and titrate EnSheppard PentonPharmD,  BCACP, [id number]. [address] AvenueGrSeattleNC [id number]: [phone]: [phone]/10/2019 9:39 AM

## 2020-04-27 MED FILL — ENTRESTO 49 MG-51 MG TABLET: 49-51 | 30 days supply | Qty: 60 | Fill #0

## 2020-04-28 MED FILL — NOVOLOG MIX 70/30 VIAL: (70-30) 100 | 25 days supply | Qty: 10 | Fill #5

## 2020-04-29 ENCOUNTER — Other Ambulatory Visit: Payer: Self-pay | Admitting: *Deleted

## 2020-04-29 DIAGNOSIS — E1122 Type 2 diabetes mellitus with diabetic chronic kidney disease: Secondary | ICD-10-CM | POA: Diagnosis not present

## 2020-04-29 DIAGNOSIS — R809 Proteinuria, unspecified: Secondary | ICD-10-CM | POA: Diagnosis not present

## 2020-04-29 DIAGNOSIS — I5189 Other ill-defined heart diseases: Secondary | ICD-10-CM | POA: Diagnosis not present

## 2020-04-29 DIAGNOSIS — I129 Hypertensive chronic kidney disease with stage 1 through stage 4 chronic kidney disease, or unspecified chronic kidney disease: Secondary | ICD-10-CM | POA: Diagnosis not present

## 2020-04-29 DIAGNOSIS — F17210 Nicotine dependence, cigarettes, uncomplicated: Secondary | ICD-10-CM

## 2020-04-29 DIAGNOSIS — K219 Gastro-esophageal reflux disease without esophagitis: Secondary | ICD-10-CM | POA: Diagnosis not present

## 2020-04-29 DIAGNOSIS — Z87891 Personal history of nicotine dependence: Secondary | ICD-10-CM

## 2020-05-04 ENCOUNTER — Emergency Department (HOSPITAL_COMMUNITY)
Admission: EM | Admit: 2020-05-04 | Discharge: 2020-05-04 | Disposition: A | Discharge status: Home / Self Care | Payer: Medicare Other | Attending: Emergency Medicine | Admitting: Emergency Medicine

## 2020-05-04 ENCOUNTER — Emergency Department (HOSPITAL_COMMUNITY): Payer: Medicare Other

## 2020-05-04 ENCOUNTER — Telehealth: Payer: Medicare Other | Admitting: Family Medicine

## 2020-05-04 ENCOUNTER — Encounter (HOSPITAL_COMMUNITY): Payer: Self-pay | Admitting: Emergency Medicine

## 2020-05-04 ENCOUNTER — Other Ambulatory Visit: Payer: Self-pay

## 2020-05-04 DIAGNOSIS — K5730 Diverticulosis of large intestine without perforation or abscess without bleeding: Secondary | ICD-10-CM | POA: Diagnosis not present

## 2020-05-04 DIAGNOSIS — Z794 Long term (current) use of insulin: Secondary | ICD-10-CM | POA: Diagnosis not present

## 2020-05-04 DIAGNOSIS — R1033 Periumbilical pain: Secondary | ICD-10-CM | POA: Diagnosis not present

## 2020-05-04 DIAGNOSIS — Z7951 Long term (current) use of inhaled steroids: Secondary | ICD-10-CM | POA: Insufficient documentation

## 2020-05-04 DIAGNOSIS — J449 Chronic obstructive pulmonary disease, unspecified: Secondary | ICD-10-CM | POA: Insufficient documentation

## 2020-05-04 DIAGNOSIS — I700 Atherosclerosis of aorta: Secondary | ICD-10-CM | POA: Diagnosis not present

## 2020-05-04 DIAGNOSIS — Z79899 Other long term (current) drug therapy: Secondary | ICD-10-CM | POA: Insufficient documentation

## 2020-05-04 DIAGNOSIS — E1165 Type 2 diabetes mellitus with hyperglycemia: Secondary | ICD-10-CM | POA: Diagnosis not present

## 2020-05-04 DIAGNOSIS — J45909 Unspecified asthma, uncomplicated: Secondary | ICD-10-CM | POA: Insufficient documentation

## 2020-05-04 DIAGNOSIS — F17210 Nicotine dependence, cigarettes, uncomplicated: Secondary | ICD-10-CM | POA: Diagnosis not present

## 2020-05-04 DIAGNOSIS — K439 Ventral hernia without obstruction or gangrene: Secondary | ICD-10-CM | POA: Diagnosis not present

## 2020-05-04 DIAGNOSIS — I10 Essential (primary) hypertension: Secondary | ICD-10-CM | POA: Diagnosis not present

## 2020-05-04 DIAGNOSIS — E875 Hyperkalemia: Secondary | ICD-10-CM

## 2020-05-04 DIAGNOSIS — R739 Hyperglycemia, unspecified: Secondary | ICD-10-CM

## 2020-05-04 LAB — CBG MONITORING, ED
Glucose-Capillary: 583 mg/dL (ref 70–99)
Glucose-Capillary: 505 mg/dL (ref 70–99)
Glucose-Capillary: 366 mg/dL — ABNORMAL HIGH (ref 70–99)
Glucose-Capillary: 242 mg/dL — ABNORMAL HIGH (ref 70–99)

## 2020-05-04 LAB — BASIC METABOLIC PANEL
Anion gap: 14 (ref 5–15)
Anion gap: 8 (ref 5–15)
BUN: 33 mg/dL — ABNORMAL HIGH (ref 6–20)
BUN: 22 mg/dL — ABNORMAL HIGH (ref 6–20)
CO2: 29 mmol/L (ref 22–32)
CO2: 27 mmol/L (ref 22–32)
Calcium: 10.6 mg/dL — ABNORMAL HIGH (ref 8.9–10.3)
Calcium: 8 mg/dL — ABNORMAL LOW (ref 8.9–10.3)
Chloride: 90 mmol/L — ABNORMAL LOW (ref 98–111)
Chloride: 105 mmol/L (ref 98–111)
Creatinine, Ser: 1.02 mg/dL — ABNORMAL HIGH (ref 0.44–1.00)
Creatinine, Ser: 0.54 mg/dL (ref 0.44–1.00)
GFR calc Af Amer: >60 mL/min (ref >60)
GFR calc Af Amer: >60 mL/min (ref >60)
GFR calc non Af Amer: >60 mL/min (ref >60)
GFR calc non Af Amer: >60 mL/min (ref >60)
Glucose, Bld: 534 mg/dL (ref 70–99)
Glucose, Bld: 230 mg/dL — ABNORMAL HIGH (ref 70–99)
Potassium: 6 mmol/L — ABNORMAL HIGH (ref 3.5–5.1)
Potassium: 4.1 mmol/L (ref 3.5–5.1)
Sodium: 133 mmol/L — ABNORMAL LOW (ref 135–145)
Sodium: 140 mmol/L (ref 135–145)

## 2020-05-04 LAB — URINALYSIS, ROUTINE W REFLEX MICROSCOPIC
Bacteria, UA: NONE SEEN
Bilirubin Urine: NEGATIVE
Glucose, UA: >=500 mg/dL — AB
Hgb urine dipstick: NEGATIVE
Ketones, ur: NEGATIVE mg/dL
Leukocytes,Ua: NEGATIVE
Nitrite: NEGATIVE
Protein, ur: NEGATIVE mg/dL
Specific Gravity, Urine: 1.024 (ref 1.005–1.030)
pH: 8 (ref 5.0–8.0)

## 2020-05-04 LAB — HEPATIC FUNCTION PANEL
ALT: 47 U/L — ABNORMAL HIGH (ref 0–44)
AST: 31 U/L (ref 15–41)
Albumin: 4.4 g/dL (ref 3.5–5.0)
Alkaline Phosphatase: 84 U/L (ref 38–126)
Bilirubin, Direct: 0.1 mg/dL (ref 0.0–0.2)
Indirect Bilirubin: 0.3 mg/dL (ref 0.3–0.9)
Total Bilirubin: 0.4 mg/dL (ref 0.3–1.2)
Total Protein: 8.4 g/dL — ABNORMAL HIGH (ref 6.5–8.1)

## 2020-05-04 LAB — CBC
HCT: 45 % (ref 36.0–46.0)
Hemoglobin: 14.5 g/dL (ref 12.0–15.0)
MCH: 29.3 pg (ref 26.0–34.0)
MCHC: 32.2 g/dL (ref 30.0–36.0)
MCV: 90.9 fL (ref 80.0–100.0)
Platelets: 192 10*3/uL (ref 150–400)
RBC: 4.95 MIL/uL (ref 3.87–5.11)
RDW: 14.5 % (ref 11.5–15.5)
WBC: 5.4 10*3/uL (ref 4.0–10.5)
nRBC: 0 % (ref 0.0–0.2)

## 2020-05-04 LAB — LIPASE, BLOOD: Lipase: 64 U/L — ABNORMAL HIGH (ref 11–51)

## 2020-05-04 MED ORDER — SODIUM CHLORIDE 0.9 % IV SOLN: INTRAVENOUS | Status: DC

## 2020-05-04 MED ORDER — IOHEXOL 300 MG/ML  SOLN
100.0000 mL | Freq: Once | INTRAMUSCULAR | Status: AC | PRN
  Administered 2020-05-04: 100 mL via INTRAVENOUS

## 2020-05-04 MED ORDER — INSULIN ASPART 100 UNIT/ML ~~LOC~~ SOLN
12.0000 [IU] | Freq: Once | SUBCUTANEOUS | Status: AC
  Administered 2020-05-04: 12 [IU] via SUBCUTANEOUS
  Filled 2020-05-04: qty 0.12

## 2020-05-04 MED ORDER — SODIUM CHLORIDE 0.9 % IV BOLUS
1000.0000 mL | Freq: Once | INTRAVENOUS | Status: AC
  Administered 2020-05-04: 1000 mL via INTRAVENOUS

## 2020-05-04 MED ORDER — SODIUM CHLORIDE (PF) 0.9 % IJ SOLN
INTRAMUSCULAR | Status: AC
  Filled 2020-05-04: qty 50

## 2020-05-04 MED ORDER — SODIUM ZIRCONIUM CYCLOSILICATE 5 G PO PACK
5.0000 g | PACK | Freq: Once | ORAL | Status: AC
  Administered 2020-05-04: 5 g via ORAL
  Filled 2020-05-04: qty 1

## 2020-05-04 MED ORDER — METOCLOPRAMIDE HCL 5 MG/ML IJ SOLN
5.0000 mg | Freq: Once | INTRAMUSCULAR | Status: AC
  Administered 2020-05-04: 5 mg via INTRAVENOUS
  Filled 2020-05-04: qty 2

## 2020-05-04 NOTE — ED Provider Notes (Addendum)
[facility] DEPT Provider Note   CSN: [id number] Arrival date & time: 05/04/20  9163     History Chief Complaint  Patient presents with  . Hyperglycemia    Sharon Swanson is a 59 y.o. female.  59 year old female presents with increased blood sugars times several days.  Patient states that she has not had her insulin dose adjusted recently.  Notes that she does not follow a strict diabetic diet.  Has had some nonbilious emesis x4 without fever or chills.  Also has a history of umbilical hernia.  Notes profound weakness.  Blood sugars at home been running in the 500s.  No treatment for this has been used prior to arrival.        Past Medical History:  Diagnosis Date  . Acid reflux   . Asthma   . Bilateral lower extremity edema 01/2020  . COPD (chronic obstructive pulmonary disease) ([place])   . CPAP (continuous positive airway pressure) dependence   . Diabetes ([place])   . Diabetes mellitus without complication ([place])    Type II  . Fatty liver 11/08/2011  . Fibroids 11/08/2011  . Hyperglycemia   . Hypertension   . Microalbuminuria 01/2020  . Obesity (BMI 30-39.9) 11/08/2011  . Osteoarthritis of right acromioclavicular joint   . Pancreatitis 11/08/2011  . Rotator cuff tear, right   . Shortness of breath on exertion 01/2020  . Sleep apnea    Can not afford Cpap machine  . Ventral hernia     Patient Active Problem List   Diagnosis Date Noted  . Bilateral lower extremity edema 02/25/2020  . Shortness of breath on exertion 02/25/2020  . Dependence on continuous supplemental oxygen 02/25/2020  . Abdominal pain, acute, periumbilical 84/66/5993  . Hemoglobin A1C greater than 9%, indicating poor diabetic control 10/19/2019  . Class 3 severe obesity due to excess calories with serious comorbidity and body mass index (BMI) of 45.0 to 49.9 in adult [facility]) 09/21/2019  . Hemoglobin A1C between 7% and 9% indicating borderline diabetic control 09/21/2019  .  Chronic respiratory failure ([place]) 08/02/2019  . Diastolic dysfunction 57/09/7791  . Cellulitis 08/02/2019  . Insulin-requiring or dependent type II diabetes mellitus ([place]) 01/19/2017  . Essential hypertension 01/19/2017  . Anxiety 01/19/2017  . Chest pain 01/19/2017  . OSA (obstructive sleep apnea) 01/19/2017  . COPD (chronic obstructive pulmonary disease) ([place])   . Acute respiratory failure ([place]) 08/17/2014  . Umbilical hernia 90/30/0923  . Pulmonary nodule, left 08/17/2014  . Constipation, chronic 08/15/2012  . GERD (gastroesophageal reflux disease) 11/08/2011  . Tobacco abuse 11/08/2011  . Fatty liver 11/08/2011  . BMI 45.0-49.9, adult ([place]) 11/08/2011  . Fibroids 11/08/2011    Past Surgical History:  Procedure Laterality Date  . ORTHOPEDIC SURGERY     R ankle, tendonitis     OB History    Gravida  1   Para  0   Term  0   Preterm  0   AB  0   Living  0     SAB  0   TAB  0   Ectopic  0   Multiple  0   Live Births              Family History  Problem Relation Age of Onset  . Hypertension Mother   . Diabetes Other     Social History   Tobacco Use  . Smoking status: Current Every Day Smoker    Packs/day: 0.50    Years: 39.00  Pack years: 19.50    Types: Cigarettes    Start date: 09/25/1980  . Smokeless tobacco: Never Used  . Tobacco comment: pt states she smoke 2 cigerettes a day  Vaping Use  . Vaping Use: Never used  Substance Use Topics  . Alcohol use: No  . Drug use: No    Home Medications Prior to Admission medications   Medication Sig Start Date End Date Taking? Authorizing Provider  albuterol (VENTOLIN HFA) 108 (90 Base) MCG/ACT inhaler Inhale 2 puffs into the lungs every 4 (four) hours as needed for wheezing or shortness of breath. 09/16/19   Azzie Glatter, FNP  carvedilol (COREG) 6.25 MG tablet Take 1 tablet (6.25 mg total) by mouth 2 (two) times daily. 03/19/20   Jettie Booze, MD  cephALEXin (KEFLEX) 500 MG capsule  Take 1 capsule (500 mg total) by mouth 4 (four) times daily. Patient not taking: Reported on 04/26/2020 04/15/20   Milton Ferguson, MD  cetirizine (ZYRTEC) 10 MG tablet Take 1 tablet (10 mg total) by mouth in the morning. 01/19/20   Azzie Glatter, FNP  docusate sodium (COLACE) 100 MG capsule Take 1 capsule (100 mg total) by mouth 3 (three) times daily as needed. 09/16/19   Azzie Glatter, FNP  fluticasone (FLONASE) 50 MCG/ACT nasal spray Place 1 spray into both nostrils daily. 09/16/19   Azzie Glatter, FNP  furosemide (LASIX) 40 MG tablet Take 2m twice daily for 3 days then take 475mtwice daily. 03/19/20   VaJettie BoozeMD  gabapentin (NEURONTIN) 300 MG capsule TAKE 1 CAPSULE (300 MG TOTAL) BY MOUTH 3 (THREE) TIMES DAILY. 02/25/20   StAzzie GlatterFNP  glucose monitoring kit (FREESTYLE) monitoring kit 1 each by Does not apply route as needed for other. 09/16/19   StAzzie GlatterFNP  HYDROcodone-acetaminophen (NORCO/VICODIN) 5-325 MG tablet Take 1 tablet by mouth every 6 (six) hours as needed for moderate pain. 04/15/20   ZaMilton FergusonMD  insulin aspart (NOVOLOG) 100 UNIT/ML injection Inject 0-5 Units into the skin at bedtime. 09/16/19   StAzzie GlatterFNP  insulin aspart protamine- aspart (NOVOLOG MIX 70/30) (70-30) 100 UNIT/ML injection Inject 0.2 mLs (20 Units total) into the skin 2 (two) times daily with a meal. 09/16/19   StAzzie GlatterFNP  ipratropium-albuterol (DUONEB) 0.5-2.5 (3) MG/3ML SOLN Take 3 mLs by nebulization every 6 (six) hours as needed (shortness of breath, wheeze). 09/16/19 10/10/20  StAzzie GlatterFNP  methocarbamol (ROBAXIN) 750 MG tablet Take 1 tablet (750 mg total) by mouth 3 (three) times daily. 02/18/20   StAzzie GlatterFNP  Misc. Devices (BARIATRIC ROLLATOR) MISC 1 Device by Does not apply route daily. 08/11/19   SeHarold HedgeMD  montelukast (SINGULAIR) 10 MG tablet Take 1 tablet (10 mg total) by mouth at bedtime. 01/19/20   StAzzie GlatterFNP  nicotine (NICODERM CQ - DOSED IN MG/24 HOURS) 21 mg/24hr patch Place 1 patch (21 mg total) onto the skin daily. 04/08/20   Mannam, PrHart RobinsonsMD  pantoprazole (PROTONIX) 40 MG tablet Take 1 tablet (40 mg total) by mouth 2 (two) times daily before a meal. 02/25/20 09/22/20  StAzzie GlatterFNP  sacubitril-valsartan (ENTRESTO) 49-51 MG Take 1 tablet by mouth 2 (two) times daily. 04/26/20 05/26/20  VaJettie BoozeMD  spironolactone (ALDACTONE) 25 MG tablet Take 1 tablet (25 mg total) by mouth daily. 02/25/20   StAzzie GlatterFNP  Tiotropium Bromide-Olodaterol (STIOLTO RESPIMAT) 2.5-2.5  MCG/ACT AERS Inhale 2 puffs into the lungs daily. 04/08/20   Mannam, Hart Robinsons, MD  varenicline (CHANTIX) 0.5 MG tablet Take 1 tablet (0.5 mg total) by mouth 2 (two) times daily. 04/08/20   Marshell Garfinkel, MD    Allergies    Aspirin  Review of Systems   Review of Systems  All other systems reviewed and are negative.   Physical Exam Updated Vital Signs BP (!) 144/73   Pulse 93   Temp 98.6 F (37 C) (Oral)   Resp 14   Ht 1.702 m (5' 7" )   Wt (!) 141.1 kg   SpO2 97%   BMI 48.71 kg/m   Physical Exam Vitals and nursing note reviewed.  Constitutional:      General: She is not in acute distress.    Appearance: Normal appearance. She is well-developed. She is not toxic-appearing.  HENT:     Head: Normocephalic and atraumatic.  Eyes:     General: Lids are normal.     Conjunctiva/sclera: Conjunctivae normal.     Pupils: Pupils are equal, round, and reactive to light.  Neck:     Thyroid: No thyroid mass.     Trachea: No tracheal deviation.  Cardiovascular:     Rate and Rhythm: Normal rate and regular rhythm.     Heart sounds: Normal heart sounds. No murmur heard.  No gallop.   Pulmonary:     Effort: Pulmonary effort is normal. No respiratory distress.     Breath sounds: Normal breath sounds. No stridor. No decreased breath sounds, wheezing, rhonchi or rales.  Abdominal:      General: Bowel sounds are normal. There is no distension.     Palpations: Abdomen is soft.     Tenderness: There is abdominal tenderness in the periumbilical area. There is no rebound.    Musculoskeletal:        General: No tenderness. Normal range of motion.     Cervical back: Normal range of motion and neck supple.  Skin:    General: Skin is warm and dry.     Findings: No abrasion or rash.  Neurological:     Mental Status: She is alert and oriented to person, place, and time.     GCS: GCS eye subscore is 4. GCS verbal subscore is 5. GCS motor subscore is 6.     Cranial Nerves: No cranial nerve deficit.     Sensory: No sensory deficit.  Psychiatric:        Speech: Speech normal.        Behavior: Behavior normal.     ED Results / Procedures / Treatments   Labs (all labs ordered are listed, but only abnormal results are displayed) Labs Reviewed  BASIC METABOLIC PANEL - Abnormal; Notable for the following components:      Result Value   Sodium 133 (*)    Potassium 6.0 (*)    Chloride 90 (*)    Glucose, Bld 534 (*)    BUN 33 (*)    Creatinine, Ser 1.02 (*)    Calcium 10.6 (*)    All other components within normal limits  CBG MONITORING, ED - Abnormal; Notable for the following components:   Glucose-Capillary 583 (*)    All other components within normal limits  CBG MONITORING, ED - Abnormal; Notable for the following components:   Glucose-Capillary 505 (*)    All other components within normal limits  CBC  URINALYSIS, ROUTINE W REFLEX MICROSCOPIC  LIPASE, BLOOD  HEPATIC FUNCTION PANEL  EKG None  Radiology No results found.  Procedures Procedures (including critical care time)  Medications Ordered in ED Medications  metoCLOPramide (REGLAN) injection 5 mg (has no administration in time range)  sodium chloride 0.9 % bolus 1,000 mL (has no administration in time range)  0.9 %  sodium chloride infusion (has no administration in time range)  insulin aspart  (novoLOG) injection 12 Units (has no administration in time range)    ED Course  I have reviewed the triage vital signs and the nursing notes.  Pertinent labs & imaging results that were available during my care of the patient were reviewed by me and considered in my medical decision making (see chart for details).    MDM Rules/Calculators/A&P                          Patient here with hyperglycemia and treat with IV fluids as well as insulin. Repeat blood sugars have gradually improved. No evidence of DKA. Potassium was elevated initially and treated with [facility] and repeat potassium shows good improvement.  Patient on Aldactone and will stop this.  Will send inbox message to her provider to have her follow-up for her insulin regulation. Return precautions given Final Clinical Impression(s) / ED Diagnoses Final diagnoses:  None    Rx / DC Orders ED Discharge Orders    None       Lacretia Leigh, MD 05/04/20 1456    Lacretia Leigh, MD 05/04/20 1505

## 2020-05-04 NOTE — Discharge Instructions (Signed)
Stop taking your Aldactone.  Call your provider to schedule an appointment to be seen sooner.

## 2020-05-04 NOTE — ED Notes (Signed)
CRITICAL VALUE ALERT  Critical Value:  Glucose 534  Date & Time Notied:  05/04/2020 0859  Provider Notified: Zenia Resides, EDP  Orders Received/Actions taken: already have orders

## 2020-05-04 NOTE — ED Triage Notes (Signed)
Pt reports having elevated blood glucose for the last 3 days of over 500.

## 2020-05-05 ENCOUNTER — Telehealth: Payer: Self-pay | Admitting: Family Medicine

## 2020-05-19 ENCOUNTER — Telehealth: Payer: Self-pay | Admitting: Acute Care

## 2020-05-19 NOTE — Telephone Encounter (Signed)
Pt called back - no longer needs to r/s appt-pr

## 2020-05-24 ENCOUNTER — Other Ambulatory Visit: Payer: Self-pay | Admitting: Interventional Cardiology

## 2020-05-24 ENCOUNTER — Other Ambulatory Visit: Payer: Self-pay | Admitting: Family Medicine

## 2020-05-24 DIAGNOSIS — I5032 Chronic diastolic (congestive) heart failure: Secondary | ICD-10-CM

## 2020-05-24 DIAGNOSIS — R600 Localized edema: Secondary | ICD-10-CM

## 2020-05-25 ENCOUNTER — Other Ambulatory Visit: Payer: Self-pay | Admitting: Family Medicine

## 2020-05-25 DIAGNOSIS — J449 Chronic obstructive pulmonary disease, unspecified: Secondary | ICD-10-CM | POA: Diagnosis not present

## 2020-05-25 DIAGNOSIS — L0390 Cellulitis, unspecified: Secondary | ICD-10-CM | POA: Diagnosis not present

## 2020-05-25 DIAGNOSIS — G4733 Obstructive sleep apnea (adult) (pediatric): Secondary | ICD-10-CM | POA: Diagnosis not present

## 2020-05-25 MED ORDER — ENTRESTO 49-51 MG PO TABS: 1.0000 | ORAL_TABLET | Freq: Two times a day (BID) | ORAL | 9 refills | Status: DC

## 2020-05-26 ENCOUNTER — Inpatient Hospital Stay: Admission: RE | Admit: 2020-05-26 | Payer: Medicare Other | Source: Ambulatory Visit

## 2020-05-26 ENCOUNTER — Ambulatory Visit (INDEPENDENT_AMBULATORY_CARE_PROVIDER_SITE_OTHER)
Admission: RE | Admit: 2020-05-26 | Discharge: 2020-05-26 | Disposition: A | Discharge status: Home / Self Care | Payer: Medicare HMO | Source: Ambulatory Visit | Attending: Acute Care | Admitting: Acute Care

## 2020-05-26 ENCOUNTER — Ambulatory Visit: Payer: Medicare Other

## 2020-05-26 ENCOUNTER — Other Ambulatory Visit: Payer: Self-pay

## 2020-05-26 ENCOUNTER — Encounter: Payer: Self-pay | Admitting: Acute Care

## 2020-05-26 ENCOUNTER — Ambulatory Visit (INDEPENDENT_AMBULATORY_CARE_PROVIDER_SITE_OTHER): Payer: Medicare HMO | Admitting: Acute Care

## 2020-05-26 VITALS — BP 137/80 | HR 86 | Temp 98.1°F | Ht 67.0 in | Wt 310.0 lb

## 2020-05-26 DIAGNOSIS — F17210 Nicotine dependence, cigarettes, uncomplicated: Secondary | ICD-10-CM

## 2020-05-26 DIAGNOSIS — Z122 Encounter for screening for malignant neoplasm of respiratory organs: Secondary | ICD-10-CM

## 2020-05-26 DIAGNOSIS — Z87891 Personal history of nicotine dependence: Secondary | ICD-10-CM | POA: Diagnosis not present

## 2020-05-26 DIAGNOSIS — E559 Vitamin D deficiency, unspecified: Secondary | ICD-10-CM

## 2020-05-26 DIAGNOSIS — E785 Hyperlipidemia, unspecified: Secondary | ICD-10-CM

## 2020-05-26 HISTORY — DX: Hyperlipidemia, unspecified: E78.5

## 2020-05-26 HISTORY — DX: Vitamin D deficiency, unspecified: E55.9

## 2020-05-26 MED ORDER — STIOLTO RESPIMAT 2.5-2.5 MCG/ACT IN AERS: 2.0000 | INHALATION_SPRAY | Freq: Every day | RESPIRATORY_TRACT | 0 refills | Status: DC

## 2020-05-26 MED FILL — GABAPENTIN 300 MG CAPSULE: 300 | 30 days supply | Qty: 90 | Fill #2

## 2020-05-26 MED FILL — ENTRESTO 49 MG-51 MG TABLET: 49-51 | 30 days supply | Qty: 60 | Fill #0

## 2020-05-26 MED FILL — SPIRONOLACTONE 25 MG TABLET: 25 | 30 days supply | Qty: 30 | Fill #0

## 2020-05-26 MED FILL — FUROSEMIDE 40 MG TAB: 40 | 30 days supply | Qty: 60 | Fill #1

## 2020-05-26 MED FILL — METHOCARBAMOL 750 MG TABS: 750 | 30 days supply | Qty: 90 | Fill #2

## 2020-05-26 MED FILL — MONTELUKAST SOD 10 MG TAB: 10 | 30 days supply | Qty: 30 | Fill #1

## 2020-05-26 MED FILL — PANTOPRAZOLE SOD DR 40 MG T: 40 | 30 days supply | Qty: 60 | Fill #1

## 2020-05-26 NOTE — Progress Notes (Deleted)
Patient ID: Sharon Swanson                 DOB: [birth year]                      MRN: [id number]     HPI: Sharon Swanson is a 59 y.o. female referred by Dr. Irish Lack to HTN clinic. PMH is significant for HTN, COPD, DM, smoking, SOB, and CHF.  Current HTN meds: Carvedilol 6.76m BID, furosemide 40 mg daily, losartan 100 mg daily, spironolactone 25 mg daily Previously tried: amlodipine 10 mg daily, carvedilol 12.5 mg BID, clonidine 0.1 mg BID, hydralazine 50 mg TID, lisinopril 2.5 mg daily, losartan/hctz 100/25 daily, verapamil 240 mg, verapamil 360 mg BP goal: <130/80  Family History:   Social History:   Diet:   Exercise:   Home BP readings:   Wt Readings from Last 3 Encounters:  05/04/20 (!) 311 lb (141.1 kg)  04/26/20 (!) 310 lb 12.8 oz (141 kg)  04/08/20 (!) 312 lb 12.8 oz (141.9 kg)   BP Readings from Last 3 Encounters:  05/04/20 (!) 162/85  04/26/20 (!) 142/84  04/15/20 (!) 149/69   Pulse Readings from Last 3 Encounters:  05/04/20 94  04/26/20 82  04/15/20 84    Renal function: CrCl cannot be calculated (Patient's most recent lab result is older than the maximum 21 days allowed.).  Past Medical History:  Diagnosis Date  . Acid reflux   . Asthma   . Bilateral lower extremity edema 01/2020  . COPD (chronic obstructive pulmonary disease) ([place]   . CPAP (continuous positive airway pressure) dependence   . Diabetes ([facility]   . Diabetes mellitus without complication ([place]    Type II  . Fatty liver 11/08/2011  . Fibroids 11/08/2011  . Hyperglycemia   . Hypertension   . Microalbuminuria 01/2020  . Obesity (BMI 30-39.9) 11/08/2011  . Osteoarthritis of right acromioclavicular joint   . Pancreatitis 11/08/2011  . Rotator cuff tear, right   . Shortness of breath on exertion 01/2020  . Sleep apnea    Can not afford Cpap machine  . Ventral hernia     Current Outpatient Medications on File Prior to Visit  Medication Sig Dispense Refill  . albuterol (VENTOLIN HFA) 108  (90 Base) MCG/ACT inhaler Inhale 2 puffs into the lungs every 4 (four) hours as needed for wheezing or shortness of breath. 8 g 12  . carvedilol (COREG) 6.25 MG tablet Take 1 tablet (6.25 mg total) by mouth 2 (two) times daily. 180 tablet 3  . cephALEXin (KEFLEX) 500 MG capsule Take 1 capsule (500 mg total) by mouth 4 (four) times daily. (Patient not taking: Reported on 04/26/2020) 28 capsule 0  . cetirizine (ZYRTEC) 10 MG tablet Take 1 tablet (10 mg total) by mouth in the morning. 30 tablet 11  . docusate sodium (COLACE) 100 MG capsule Take 1 capsule (100 mg total) by mouth 3 (three) times daily as needed. 30 capsule 3  . fluticasone (FLONASE) 50 MCG/ACT nasal spray Place 1 spray into both nostrils daily. 16 g 3  . furosemide (LASIX) 20 MG tablet Take 60 mg by mouth daily.    . furosemide (LASIX) 40 MG tablet Take 831mtwice daily for 3 days then take 4097mwice daily. 70 tablet 0  . gabapentin (NEURONTIN) 300 MG capsule TAKE 1 CAPSULE (300 MG TOTAL) BY MOUTH 3 (THREE) TIMES DAILY. 90 capsule 3  . glucose monitoring kit (FREESTYLE) monitoring kit 1 each by Does  not apply route as needed for other. 1 each 0  . HYDROcodone-acetaminophen (NORCO/VICODIN) 5-325 MG tablet Take 1 tablet by mouth every 6 (six) hours as needed for moderate pain. 20 tablet 0  . insulin aspart (NOVOLOG) 100 UNIT/ML injection Inject 0-5 Units into the skin at bedtime. 10 mL 11  . insulin aspart protamine- aspart (NOVOLOG MIX 70/30) (70-30) 100 UNIT/ML injection Inject 0.2 mLs (20 Units total) into the skin 2 (two) times daily with a meal. 10 mL 12  . ipratropium-albuterol (DUONEB) 0.5-2.5 (3) MG/3ML SOLN Take 3 mLs by nebulization every 6 (six) hours as needed (shortness of breath, wheeze). 360 mL 12  . losartan (COZAAR) 50 MG tablet Take 50 mg by mouth daily.    . methocarbamol (ROBAXIN) 750 MG tablet Take 1 tablet (750 mg total) by mouth 3 (three) times daily. 90 tablet 3  . Misc. Devices (BARIATRIC ROLLATOR) MISC 1 Device by  Does not apply route daily. 1 each 0  . montelukast (SINGULAIR) 10 MG tablet Take 1 tablet (10 mg total) by mouth at bedtime. 30 tablet 3  . nicotine (NICODERM CQ - DOSED IN MG/24 HOURS) 21 mg/24hr patch Place 1 patch (21 mg total) onto the skin daily. 28 patch 0  . pantoprazole (PROTONIX) 40 MG tablet Take 1 tablet (40 mg total) by mouth 2 (two) times daily before a meal. 60 tablet 6  . sacubitril-valsartan (ENTRESTO) 49-51 MG Take 1 tablet by mouth 2 (two) times daily. 60 tablet 9  . spironolactone (ALDACTONE) 25 MG tablet TAKE 1 TABLET (25 MG TOTAL) BY MOUTH DAILY. 30 tablet 6  . Tiotropium Bromide-Olodaterol (STIOLTO RESPIMAT) 2.5-2.5 MCG/ACT AERS Inhale 2 puffs into the lungs daily. 4 g 0  . varenicline (CHANTIX) 0.5 MG tablet Take 1 tablet (0.5 mg total) by mouth 2 (two) times daily. 60 tablet 3   No current facility-administered medications on file prior to visit.    Allergies  Allergen Reactions  . Aspirin Other (See Comments)    Abdominal pain     Assessment/Plan:  1. Hypertension -

## 2020-05-26 NOTE — Progress Notes (Signed)
Shared Decision Making Visit Lung Cancer Screening Program [phone])   Eligibility:  Age 59 y.o.  Pack Years Smoking History Calculation 30 pack year smoking history (# packs/per year x # years smoked)  Recent History of coughing up blood  no  Unexplained weight loss? no ( >Than 15 pounds within the last 6 months )  Prior History Lung / other cancer no (Diagnosis within the last 5 years already requiring surveillance chest CT Scans).  Smoking Status Current Smoker  Former Smokers: Years since quit: NA  Quit Date: NA  Visit Components:  Discussion included one or more decision making aids. yes  Discussion included risk/benefits of screening. yes  Discussion included potential follow up diagnostic testing for abnormal scans. yes  Discussion included meaning and risk of over diagnosis. yes  Discussion included meaning and risk of False Positives. yes  Discussion included meaning of total radiation exposure. yes  Counseling Included:  Importance of adherence to annual lung cancer LDCT screening. yes  Impact of comorbidities on ability to participate in the program. yes  Ability and willingness to under diagnostic treatment. yes  Smoking Cessation Counseling:  Current Smokers:   Discussed importance of smoking cessation. yes  Information about tobacco cessation classes and interventions provided to patient. yes  Patient provided with "ticket" for LDCT Scan. yes  Symptomatic Patient. no  Counseling  Diagnosis Code: Tobacco Use Z72.0  Asymptomatic Patient yes  Counseling (Intermediate counseling: > three minutes counseling) F0932  Former Smokers:   Discussed the importance of maintaining cigarette abstinence. yes  Diagnosis Code: Personal History of Nicotine Dependence. T55.732  Information about tobacco cessation classes and interventions provided to patient. Yes  Patient provided with "ticket" for LDCT Scan. yes  Written Order for Lung Cancer  Screening with LDCT placed in Epic. Yes (CT Chest Lung Cancer Screening Low Dose W/O CM) [id number] Z12.2-Screening of respiratory organs Z87.891-Personal history of nicotine dependence  BP 137/80 (BP Location: Left Arm, Cuff Size: Normal)   Pulse 86   Temp 98.1 F (36.7 C) (Oral)   Ht 5\' 7"  (1.702 m)   Wt (!) 310 lb (140.6 kg)   SpO2 98%   BMI 48.55 kg/m    I have spent 25 minutes of face to face time with Ms. Lorman discussing the risks and benefits of lung cancer screening. We viewed a power point together that explained in detail the above noted topics. We paused at intervals to allow for questions to be asked and answered to ensure understanding.We discussed that the single most powerful action that she can take to decrease her risk of developing lung cancer is to quit smoking. We discussed whether or not she is ready to commit to setting a quit date. We discussed options for tools to aid in quitting smoking including nicotine replacement therapy, non-nicotine medications, support groups, Quit Smart classes, and behavior modification. We discussed that often times setting smaller, more achievable goals, such as eliminating 1 cigarette a day for a week and then 2 cigarettes a day for a week can be helpful in slowly decreasing the number of cigarettes smoked. This allows for a sense of accomplishment as well as providing a clinical benefit. I gave her the " Be Stronger Than Your Excuses" card with contact information for community resources, classes, free nicotine replacement therapy, and access to mobile apps, text messaging, and on-line smoking cessation help. I have also given her my card and contact information in the event she needs to contact me. We discussed the time  and location of the scan, and that either Doroteo Glassman RN or I will call with the results within 24-48 hours of receiving them. I have offered her  a copy of the power point we viewed  as a resource in the event they need  reinforcement of the concepts we discussed today in the office. The patient verbalized understanding of all of  the above and had no further questions upon leaving the office. They have my contact information in the event they have any further questions.  I spent 4 minutes counseling on smoking cessation and the health risks of continued tobacco abuse.  She is taking Chantix and has cut down to 2 cigarettes daily.She is also using nicotine patches.   I explained to the patient that there has been a high incidence of coronary artery disease noted on these exams. I explained that this is a non-gated exam therefore degree or severity cannot be determined. This patient is not on statin therapy. I have asked the patient to follow-up with their PCP regarding any incidental finding of coronary artery disease and management with diet or medication as their PCP  feels is clinically indicated. The patient verbalized understanding of the above and had no further questions upon completion of the visit.      Magdalen Spatz, NP 05/26/2020 10:09 AM

## 2020-05-26 NOTE — Patient Instructions (Signed)
Thank you for participating in the [facility]. It was our pleasure to meet you today. We will call you with the results of your scan within the next few days. Your scan will be assigned a Lung RADS category score by the physicians reading the scans.  This Lung RADS score determines follow up scanning.  See below for description of categories, and follow up screening recommendations. We will be in touch to schedule your follow up screening annually or based on recommendations of our providers. We will fax a copy of your scan results to your Primary Care Physician, or the physician who referred you to the program, to ensure they have the results. Please call the office if you have any questions or concerns regarding your scanning experience or results.  Our office number is [phone]. Please speak with Denise Phelps, RN. She is our Lung Cancer Screening RN. If she is unavailable when you call, please have the office staff send her a message. She will return your call at her earliest convenience. Remember, if your scan is normal, we will scan you annually as long as you continue to meet the criteria for the program. (Age 55-77, Current smoker or smoker who has quit within the last 15 years). If you are a smoker, remember, quitting is the single most powerful action that you can take to decrease your risk of lung cancer and other pulmonary, breathing related problems. We know quitting is hard, and we are here to help.  Please let us know if there is anything we can do to help you meet your goal of quitting. If you are a former smoker, congratulations. We are proud of you! Remain smoke free! Remember you can refer friends or family members through the number above.  We will screen them to make sure they meet criteria for the program. Thank you for helping us take better care of you by participating in Lung Screening.  Lung RADS Categories:  Lung RADS 1: no nodules  or definitely non-concerning nodules.  Recommendation is for a repeat annual scan in 12 months.  Lung RADS 2:  nodules that are non-concerning in appearance and behavior with a very low likelihood of becoming an active cancer. Recommendation is for a repeat annual scan in 12 months.  Lung RADS 3: nodules that are probably non-concerning , includes nodules with a low likelihood of becoming an active cancer.  Recommendation is for a 6-month repeat screening scan. Often noted after an upper respiratory illness. We will be in touch to make sure you have no questions, and to schedule your 6-month scan.  Lung RADS 4 A: nodules with concerning findings, recommendation is most often for a follow up scan in 3 months or additional testing based on our provider's assessment of the scan. We will be in touch to make sure you have no questions and to schedule the recommended 3 month follow up scan.  Lung RADS 4 B:  indicates findings that are concerning. We will be in touch with you to schedule additional diagnostic testing based on our provider's  assessment of the scan.   

## 2020-05-27 ENCOUNTER — Inpatient Hospital Stay: Admission: RE | Admit: 2020-05-27 | Payer: Medicare Other | Source: Ambulatory Visit

## 2020-05-27 DIAGNOSIS — G4733 Obstructive sleep apnea (adult) (pediatric): Secondary | ICD-10-CM | POA: Diagnosis not present

## 2020-05-28 ENCOUNTER — Encounter: Payer: Self-pay | Admitting: Family Medicine

## 2020-05-28 ENCOUNTER — Ambulatory Visit (INDEPENDENT_AMBULATORY_CARE_PROVIDER_SITE_OTHER): Payer: Medicare HMO | Admitting: Family Medicine

## 2020-05-28 ENCOUNTER — Other Ambulatory Visit: Payer: Self-pay

## 2020-05-28 ENCOUNTER — Other Ambulatory Visit: Payer: Self-pay | Admitting: Family Medicine

## 2020-05-28 VITALS — BP 146/62 | HR 87 | Temp 98.2°F | Resp 17 | Ht 67.0 in | Wt 311.8 lb

## 2020-05-28 DIAGNOSIS — Z23 Encounter for immunization: Secondary | ICD-10-CM

## 2020-05-28 DIAGNOSIS — Z0000 Encounter for general adult medical examination without abnormal findings: Secondary | ICD-10-CM | POA: Diagnosis not present

## 2020-05-28 DIAGNOSIS — I10 Essential (primary) hypertension: Secondary | ICD-10-CM | POA: Diagnosis not present

## 2020-05-28 DIAGNOSIS — Z09 Encounter for follow-up examination after completed treatment for conditions other than malignant neoplasm: Secondary | ICD-10-CM

## 2020-05-28 DIAGNOSIS — Z9981 Dependence on supplemental oxygen: Secondary | ICD-10-CM | POA: Diagnosis not present

## 2020-05-28 DIAGNOSIS — I5032 Chronic diastolic (congestive) heart failure: Secondary | ICD-10-CM

## 2020-05-28 DIAGNOSIS — R0602 Shortness of breath: Secondary | ICD-10-CM

## 2020-05-28 DIAGNOSIS — G4733 Obstructive sleep apnea (adult) (pediatric): Secondary | ICD-10-CM

## 2020-05-28 DIAGNOSIS — R600 Localized edema: Secondary | ICD-10-CM | POA: Diagnosis not present

## 2020-05-28 DIAGNOSIS — I110 Hypertensive heart disease with heart failure: Secondary | ICD-10-CM

## 2020-05-28 DIAGNOSIS — R739 Hyperglycemia, unspecified: Secondary | ICD-10-CM

## 2020-05-28 DIAGNOSIS — Z794 Long term (current) use of insulin: Secondary | ICD-10-CM

## 2020-05-28 DIAGNOSIS — J302 Other seasonal allergic rhinitis: Secondary | ICD-10-CM | POA: Diagnosis not present

## 2020-05-28 DIAGNOSIS — R7303 Prediabetes: Secondary | ICD-10-CM | POA: Diagnosis not present

## 2020-05-28 DIAGNOSIS — E1165 Type 2 diabetes mellitus with hyperglycemia: Secondary | ICD-10-CM

## 2020-05-28 DIAGNOSIS — J449 Chronic obstructive pulmonary disease, unspecified: Secondary | ICD-10-CM

## 2020-05-28 DIAGNOSIS — E119 Type 2 diabetes mellitus without complications: Secondary | ICD-10-CM

## 2020-05-28 LAB — GLUCOSE, POCT (MANUAL RESULT ENTRY): POC Glucose: 343 mg/dl — AB (ref 70–99)

## 2020-05-28 LAB — POCT URINALYSIS DIPSTICK
Bilirubin, UA: NEGATIVE
Blood, UA: NEGATIVE
Glucose, UA: POSITIVE — AB
Ketones, UA: NEGATIVE
Leukocytes, UA: NEGATIVE
Nitrite, UA: NEGATIVE
Protein, UA: POSITIVE — AB
Spec Grav, UA: 1.015 (ref 1.010–1.025)
Urobilinogen, UA: 0.2 E.U./dL
pH, UA: 5 (ref 5.0–8.0)

## 2020-05-28 LAB — POCT GLYCOSYLATED HEMOGLOBIN (HGB A1C)
HbA1c POC (<> result, manual entry): 12.1 % (ref 4.0–5.6)
HbA1c, POC (controlled diabetic range): 12.1 % — AB (ref 0.0–7.0)
HbA1c, POC (prediabetic range): 12.1 % — AB (ref 5.7–6.4)
Hemoglobin A1C: 12.1 % — AB (ref 4.0–5.6)

## 2020-05-28 MED ORDER — INSULIN ASPART 100 UNIT/ML ~~LOC~~ SOLN: 20.0000 [IU] | Freq: Three times a day (TID) | SUBCUTANEOUS | 11 refills | Status: DC

## 2020-05-28 MED ORDER — DIPHTH-ACELL PERTUSSIS-TETANUS 25-58-10 LF-MCG/0.5 IM SUSP
0.5000 mL | Freq: Once | INTRAMUSCULAR | Status: AC
  Administered 2020-05-28: 0.5 mL via INTRAMUSCULAR

## 2020-05-28 MED ORDER — LANTUS SOLOSTAR 100 UNIT/ML ~~LOC~~ SOPN: 50.0000 [IU] | PEN_INJECTOR | Freq: Every day | SUBCUTANEOUS | 99 refills | Status: DC

## 2020-05-28 MED ORDER — FLUMAZENIL 0.5 MG/5ML IV SOLN
0.5000 mg | Freq: Once | INTRAVENOUS | Status: AC
  Administered 2020-06-02: 0.5 mg via INTRAVENOUS

## 2020-05-28 NOTE — Progress Notes (Signed)
Patient [facility]   Annual Physical/Hospital Follow Up  Subjective:  Patient ID: Sharon Swanson, female    DOB: 12-30-1960  Age: 59 y.o. MRN: [id number]  CC:  Chief Complaint  Patient presents with  . Follow-up    Pt states she was in the hospital X2wks ago. Pt states was blood sugar was over 500    HPI Sharon Swanson is a 59 year old female who presents for Hospital Follow Up today.   Past Medical History:  Diagnosis Date  . Acid reflux   . Asthma   . Bilateral lower extremity edema 01/2020  . COPD (chronic obstructive pulmonary disease) ([place])   . CPAP (continuous positive airway pressure) dependence   . Diabetes ([place])   . Diabetes mellitus without complication ([place])    Type II  . Fatty liver 11/08/2011  . Fibroids 11/08/2011  . Hyperglycemia   . Hyperlipidemia 05/2020  . Hypertension   . Microalbuminuria 01/2020  . Obesity (BMI 30-39.9) 11/08/2011  . Osteoarthritis of right acromioclavicular joint   . Pancreatitis 11/08/2011  . Rotator cuff tear, right   . Shortness of breath on exertion 01/2020  . Sleep apnea   . Ventral hernia   . Vitamin D deficiency 05/2020    Past Surgical History:  Procedure Laterality Date  . ORTHOPEDIC SURGERY     R ankle, tendonitis   Current Status: Since her last office visit, she is doing well with no complaints. She is ambulating with Rollator and is using Oxygen continuously at 2-3 liters. Her most recent normal range of preprandial blood glucose levels have been between 150-250. She has seen low range of 298 and high of 425 since his last office visit. She denies fatigue, frequent urination, blurred vision, excessive hunger, excessive thirst, weight gain, weight loss, and poor wound healing. She continues to check her feet regularly. She continues to have moderate episodes of shortness of breath, which she is on 2-3 liters of continuous oxygen. She recently began to use her CPAP and states that  she is adjusting well. She denies visual changes, chest pain, cough, heart palpitations, and falls. She has occasional headaches and dizziness with position changes. Denies severe headaches, confusion, seizures, double vision, and blurred vision, nausea and vomiting. She denies fevers, chills, recent infections, weight loss, and night sweats. Denies GI problems such as diarrhea, and constipation. She has no reports of blood in stools, dysuria and hematuria. She does have moderate anxiety r/t her currently health status. She is taking all medications as prescribed. She denies pain today.   Family History  Problem Relation Age of Onset  . Hypertension Mother   . Diabetes Other     Social History   Socioeconomic History  . Marital status: Married    Spouse name: Not on file  . Number of children: Not on file  . Years of education: Not on file  . Highest education level: Not on file  Occupational History  . Not on file  Tobacco Use  . Smoking status: Current Every Day Smoker    Packs/day: 0.50    Years: 39.00    Pack years: 19.50    Types: Cigarettes    Start date: 09/25/1980  . Smokeless tobacco: Never Used  . Tobacco comment: pt states she smoke 2 cigerettes a day  Vaping Use  . Vaping Use: Never used  Substance and Sexual Activity  . Alcohol use: No  . Drug use: No  . Sexual activity:  Not Currently  Other Topics Concern  . Not on file  Social History Narrative  . Not on file   Social Determinants of Health   Financial Resource Strain:   . Difficulty of Paying Living Expenses: Not on file  Food Insecurity:   . Worried About Charity fundraiser in the Last Year: Not on file  . Ran Out of Food in the Last Year: Not on file  Transportation Needs:   . Lack of Transportation (Medical): Not on file  . Lack of Transportation (Non-Medical): Not on file  Physical Activity:   . Days of Exercise per Week: Not on file  . Minutes of Exercise per Session: Not on file  Stress:   .  Feeling of Stress : Not on file  Social Connections:   . Frequency of Communication with Friends and Family: Not on file  . Frequency of Social Gatherings with Friends and Family: Not on file  . Attends Religious Services: Not on file  . Active Member of Clubs or Organizations: Not on file  . Attends Archivist Meetings: Not on file  . Marital Status: Not on file  Intimate Partner Violence:   . Fear of Current or Ex-Partner: Not on file  . Emotionally Abused: Not on file  . Physically Abused: Not on file  . Sexually Abused: Not on file    Outpatient Medications Prior to Visit  Medication Sig Dispense Refill  . cephALEXin (KEFLEX) 500 MG capsule Take 1 capsule (500 mg total) by mouth 4 (four) times daily. 28 capsule 0  . docusate sodium (COLACE) 100 MG capsule Take 1 capsule (100 mg total) by mouth 3 (three) times daily as needed. 30 capsule 3  . furosemide (LASIX) 20 MG tablet Take 60 mg by mouth daily.    . furosemide (LASIX) 40 MG tablet Take 60m twice daily for 3 days then take 480mtwice daily. 70 tablet 0  . gabapentin (NEURONTIN) 300 MG capsule TAKE 1 CAPSULE (300 MG TOTAL) BY MOUTH 3 (THREE) TIMES DAILY. 90 capsule 3  . glucose monitoring kit (FREESTYLE) monitoring kit 1 each by Does not apply route as needed for other. 1 each 0  . methocarbamol (ROBAXIN) 750 MG tablet Take 1 tablet (750 mg total) by mouth 3 (three) times daily. 90 tablet 3  . Misc. Devices (BARIATRIC ROLLATOR) MISC 1 Device by Does not apply route daily. 1 each 0  . nicotine (NICODERM CQ - DOSED IN MG/24 HOURS) 21 mg/24hr patch Place 1 patch (21 mg total) onto the skin daily. 28 patch 0  . Tiotropium Bromide-Olodaterol (STIOLTO RESPIMAT) 2.5-2.5 MCG/ACT AERS Inhale 2 puffs into the lungs daily. 4 g 0  . varenicline (CHANTIX) 0.5 MG tablet Take 1 tablet (0.5 mg total) by mouth 2 (two) times daily. 60 tablet 3  . albuterol (VENTOLIN HFA) 108 (90 Base) MCG/ACT inhaler Inhale 2 puffs into the lungs every 4  (four) hours as needed for wheezing or shortness of breath. 8 g 12  . carvedilol (COREG) 6.25 MG tablet Take 1 tablet (6.25 mg total) by mouth 2 (two) times daily. 180 tablet 3  . cetirizine (ZYRTEC) 10 MG tablet Take 1 tablet (10 mg total) by mouth in the morning. 30 tablet 11  . fluticasone (FLONASE) 50 MCG/ACT nasal spray Place 1 spray into both nostrils daily. 16 g 3  . insulin aspart (NOVOLOG) 100 UNIT/ML injection Inject 0-5 Units into the skin at bedtime. 10 mL 11  . insulin aspart protamine- aspart (  NOVOLOG MIX 70/30) (70-30) 100 UNIT/ML injection Inject 0.2 mLs (20 Units total) into the skin 2 (two) times daily with a meal. 10 mL 12  . ipratropium-albuterol (DUONEB) 0.5-2.5 (3) MG/3ML SOLN Take 3 mLs by nebulization every 6 (six) hours as needed (shortness of breath, wheeze). 360 mL 12  . losartan (COZAAR) 50 MG tablet Take 50 mg by mouth daily.    . montelukast (SINGULAIR) 10 MG tablet Take 1 tablet (10 mg total) by mouth at bedtime. 30 tablet 3  . pantoprazole (PROTONIX) 40 MG tablet Take 1 tablet (40 mg total) by mouth 2 (two) times daily before a meal. 60 tablet 6  . sacubitril-valsartan (ENTRESTO) 49-51 MG Take 1 tablet by mouth 2 (two) times daily. 60 tablet 9  . spironolactone (ALDACTONE) 25 MG tablet TAKE 1 TABLET (25 MG TOTAL) BY MOUTH DAILY. 30 tablet 6  . HYDROcodone-acetaminophen (NORCO/VICODIN) 5-325 MG tablet Take 1 tablet by mouth every 6 (six) hours as needed for moderate pain. (Patient not taking: Reported on 05/28/2020) 20 tablet 0   No facility-administered medications prior to visit.    Allergies  Allergen Reactions  . Aspirin Other (See Comments)    Abdominal pain    ROS Review of Systems  Constitutional: Negative.   HENT: Negative.   Eyes: Negative.   Respiratory: Negative.   Cardiovascular: Negative.   Gastrointestinal: Negative.   Endocrine: Negative.   Genitourinary: Negative.   Musculoskeletal: Positive for arthralgias (generalized joint pain).    Skin: Negative.   Allergic/Immunologic: Negative.   Neurological: Positive for dizziness (occasional ) and headaches (occasional ).  Hematological: Negative.   Psychiatric/Behavioral: Negative.    Objective:    Physical Exam Vitals and nursing note reviewed.  Constitutional:      Appearance: Normal appearance.  HENT:     Head: Normocephalic and atraumatic.     Nose: Nose normal.     Mouth/Throat:     Mouth: Mucous membranes are moist.     Pharynx: Oropharynx is clear.  Cardiovascular:     Rate and Rhythm: Normal rate and regular rhythm.     Pulses: Normal pulses.     Heart sounds: Normal heart sounds.  Pulmonary:     Effort: Pulmonary effort is normal.     Breath sounds: Normal breath sounds.  Abdominal:     General: Bowel sounds are normal. There is distension.     Palpations: Abdomen is soft.  Musculoskeletal:        General: Normal range of motion.     Cervical back: Normal range of motion and neck supple.  Skin:    General: Skin is warm and dry.  Neurological:     General: No focal deficit present.     Mental Status: She is alert and oriented to person, place, and time.  Psychiatric:        Mood and Affect: Mood normal.        Behavior: Behavior normal.        Thought Content: Thought content normal.        Judgment: Judgment normal.     BP (!) 146/62 (BP Location: Right Arm, Patient Position: Sitting, Cuff Size: Large)   Pulse 87   Temp 98.2 F (36.8 C)   Resp 17   Ht 5' 7" (1.702 m)   Wt (!) 311 lb 12.8 oz (141.4 kg)   SpO2 100%   BMI 48.83 kg/m  Wt Readings from Last 3 Encounters:  05/28/20 (!) 311 lb 12.8 oz (141.4 kg)  05/26/20 (!) 310 lb (140.6 kg)  05/04/20 (!) 311 lb (141.1 kg)    Health Maintenance Due  Topic Date Due  . Hepatitis C Screening  Never done  . FOOT EXAM  Never done  . OPHTHALMOLOGY EXAM  Never done  . TETANUS/TDAP  Never done  . PAP SMEAR-Modifier  Never done  . MAMMOGRAM  Never done  . INFLUENZA VACCINE  04/25/2020     There are no preventive care reminders to display for this patient.  Lab Results  Component Value Date   TSH 1.020 05/28/2020   Lab Results  Component Value Date   WBC 4.5 05/28/2020   HGB 14.0 05/28/2020   HCT 43.0 05/28/2020   MCV 91 05/28/2020   PLT 181 05/28/2020   Lab Results  Component Value Date   NA 137 05/28/2020   K 4.3 05/28/2020   CO2 25 05/28/2020   GLUCOSE 310 (H) 05/28/2020   BUN 13 05/28/2020   CREATININE 0.75 05/28/2020   BILITOT 0.3 05/28/2020   ALKPHOS 90 05/28/2020   AST 22 05/28/2020   ALT 27 05/28/2020   PROT 7.2 05/28/2020   ALBUMIN 4.4 05/28/2020   CALCIUM 9.5 05/28/2020   ANIONGAP 8 05/04/2020   GFR 128.72 03/01/2020   Lab Results  Component Value Date   CHOL 250 (H) 05/28/2020   Lab Results  Component Value Date   HDL 60 05/28/2020   Lab Results  Component Value Date   LDLCALC 162 (H) 05/28/2020   Lab Results  Component Value Date   TRIG 157 (H) 05/28/2020   Lab Results  Component Value Date   CHOLHDL 4.2 05/28/2020   Lab Results  Component Value Date   HGBA1C 12.1 (A) 05/28/2020   HGBA1C 12.1 05/28/2020   HGBA1C 12.1 (A) 05/28/2020   HGBA1C 12.1 (A) 05/28/2020    Assessment & Plan:        1. Type 2 diabetes mellitus with hyperglycemia, with long-term current use of insulin (HCC) She will continue medication as prescribed, to decrease foods/beverages high in sugars and carbs and follow Heart Healthy or DASH diet. Increase physical activity to at least 30 minutes cardio exercise daily.  - Urinalysis Dipstick - POC Glucose (CBG) - POC HgB A1c - insulin glargine (LANTUS SOLOSTAR) 100 UNIT/ML Solostar Pen; Inject 50 Units into the skin at bedtime.  Dispense: 15 mL; Refill: PRN - CBC with Differential - Comprehensive metabolic panel - Lipid Panel - TSH - Vitamin D, 25-hydroxy - Vitamin B12 - insulin aspart (NOVOLOG) 100 UNIT/ML injection; Inject 20 Units into the skin 3 (three) times daily before meals.   Dispense: 10 mL; Refill: 11  2. Hemoglobin A1C between 7% and 9% indicating borderline diabetic control Worsened. Hgb A1c increased at 12.1 today, from 8.0 on 01/19/2020. We will add Lantus and increase Novolog today. Monitor.   3. Hyperglycemia  4. Hypertensive heart disease with chronic diastolic congestive heart failure (HCC) - sacubitril-valsartan (ENTRESTO) 49-51 MG; Take 1 tablet by mouth 2 (two) times daily.  Dispense: 180 tablet; Refill: 3  5. Essential hypertension The current medical regimen is effective; blood pressure is stable at 146/62 today; continue present plan and medications as prescribed. She will continue to take medications as prescribed, to decrease high sodium intake, excessive alcohol intake, increase potassium intake, smoking cessation, and increase physical activity of at least 30 minutes of cardio activity daily. She will continue to follow Heart Healthy or DASH diet.  6. Dependence on continuous supplemental oxygen Continue continuous oxygen at  2 liters, increased at 3 liters as needed.   7. Bilateral lower extremity edema - spironolactone (ALDACTONE) 25 MG tablet; Take 1 tablet (25 mg total) by mouth daily.  Dispense: 90 tablet; Refill: 3  8. Shortness of breath on exertion Pulmonary Functions Testing Results:  TLC  Date Value Ref Range Status  12/20/2016 3.24 L Final   She is requesting smaller portable O2 machine that makes its own Oxygen 2-3 liters ([employer]). Stable. No signs or symptoms of respiratory distress noted today.  - cetirizine (ZYRTEC) 10 MG tablet; Take 1 tablet (10 mg total) by mouth in the morning.  Dispense: 90 tablet; Refill: 3 - montelukast (SINGULAIR) 10 MG tablet; Take 1 tablet (10 mg total) by mouth at bedtime.  Dispense: 90 tablet; Refill: 3  9. Seasonal allergies - cetirizine (ZYRTEC) 10 MG tablet; Take 1 tablet (10 mg total) by mouth in the morning.  Dispense: 90 tablet; Refill: 3 - montelukast (SINGULAIR) 10 MG tablet; Take 1  tablet (10 mg total) by mouth at bedtime.  Dispense: 90 tablet; Refill: 3  10. Healthcare maintenance - flumazenil (ROMAZICON) injection 0.5 mg - diphtheria-acellular pertussis-tetanus (INFANRIX) injection 0.5 mL - CBC with Differential - Comprehensive metabolic panel - Lipid Panel - TSH - Vitamin D, 25-hydroxy - Vitamin B12 - Flu Vaccine QUAD 6+ mos PF IM (Fluarix Quad PF)  11. Follow up She will follow up in 1 month.   Meds ordered this encounter  Medications  . flumazenil (ROMAZICON) injection 0.5 mg  . diphtheria-acellular pertussis-tetanus (INFANRIX) injection 0.5 mL  . insulin glargine (LANTUS SOLOSTAR) 100 UNIT/ML Solostar Pen    Sig: Inject 50 Units into the skin at bedtime.    Dispense:  15 mL    Refill:  PRN  . insulin aspart (NOVOLOG) 100 UNIT/ML injection    Sig: Inject 20 Units into the skin 3 (three) times daily before meals.    Dispense:  10 mL    Refill:  11  . albuterol (VENTOLIN HFA) 108 (90 Base) MCG/ACT inhaler    Sig: Inhale 2 puffs into the lungs every 4 (four) hours as needed for wheezing or shortness of breath.    Dispense:  8 g    Refill:  12  . carvedilol (COREG) 6.25 MG tablet    Sig: Take 1 tablet (6.25 mg total) by mouth 2 (two) times daily.    Dispense:  180 tablet    Refill:  3  . cetirizine (ZYRTEC) 10 MG tablet    Sig: Take 1 tablet (10 mg total) by mouth in the morning.    Dispense:  90 tablet    Refill:  3  . fluticasone (FLONASE) 50 MCG/ACT nasal spray    Sig: Place 1 spray into both nostrils daily.    Dispense:  16 g    Refill:  3  . ipratropium-albuterol (DUONEB) 0.5-2.5 (3) MG/3ML SOLN    Sig: Take 3 mLs by nebulization every 6 (six) hours as needed (shortness of breath, wheeze).    Dispense:  360 mL    Refill:  12  . losartan (COZAAR) 50 MG tablet    Sig: Take 1 tablet (50 mg total) by mouth daily.    Dispense:  90 tablet    Refill:  3  . montelukast (SINGULAIR) 10 MG tablet    Sig: Take 1 tablet (10 mg total) by mouth at  bedtime.    Dispense:  90 tablet    Refill:  3  . pantoprazole (PROTONIX) 40 MG tablet  Sig: Take 1 tablet (40 mg total) by mouth 2 (two) times daily before a meal.    Dispense:  180 tablet    Refill:  3  . sacubitril-valsartan (ENTRESTO) 49-51 MG    Sig: Take 1 tablet by mouth 2 (two) times daily.    Dispense:  180 tablet    Refill:  3  . spironolactone (ALDACTONE) 25 MG tablet    Sig: Take 1 tablet (25 mg total) by mouth daily.    Dispense:  90 tablet    Refill:  3    Orders Placed This Encounter  Procedures  . Flu Vaccine QUAD 6+ mos PF IM (Fluarix Quad PF)  . CBC with Differential  . Comprehensive metabolic panel  . Lipid Panel  . TSH  . Vitamin D, 25-hydroxy  . Vitamin B12  . Urinalysis Dipstick  . POC Glucose (CBG)  . POC HgB A1c    Referral Orders  No referral(s) requested today    Kathe Becton,  MSN, FNP-BC [place], [place] [address] [phone]   Problem List Items Addressed This Visit      Cardiovascular and Mediastinum   Essential hypertension   Relevant Medications   carvedilol (COREG) 6.25 MG tablet   losartan (COZAAR) 50 MG tablet   sacubitril-valsartan (ENTRESTO) 49-51 MG   spironolactone (ALDACTONE) 25 MG tablet     Endocrine   Hemoglobin A1C between 7% and 9% indicating borderline diabetic control   Relevant Medications   insulin glargine (LANTUS SOLOSTAR) 100 UNIT/ML Solostar Pen   insulin aspart (NOVOLOG) 100 UNIT/ML injection   losartan (COZAAR) 50 MG tablet     Other   Bilateral lower extremity edema   Relevant Medications   spironolactone (ALDACTONE) 25 MG tablet   Dependence on continuous supplemental oxygen   Shortness of breath on exertion   Relevant Medications   cetirizine (ZYRTEC) 10 MG tablet   montelukast (SINGULAIR) 10 MG tablet    Other Visit Diagnoses    Type 2 diabetes mellitus  with hyperglycemia, with long-term current use of insulin (HCC)    -  Primary   Relevant Medications   insulin glargine (LANTUS SOLOSTAR) 100 UNIT/ML Solostar Pen   insulin aspart (NOVOLOG) 100 UNIT/ML injection   losartan (COZAAR) 50 MG tablet   Other Relevant Orders   Urinalysis Dipstick (Completed)   POC Glucose (CBG) (Completed)   POC HgB A1c (Completed)   CBC with Differential (Completed)   Comprehensive metabolic panel (Completed)   Lipid Panel (Completed)   TSH (Completed)   Vitamin D, 25-hydroxy (Completed)   Vitamin B12 (Completed)   Hyperglycemia       Hypertensive heart disease with chronic diastolic congestive heart failure (HCC)       Relevant Medications   carvedilol (COREG) 6.25 MG tablet   losartan (COZAAR) 50 MG tablet   sacubitril-valsartan (ENTRESTO) 49-51 MG   spironolactone (ALDACTONE) 25 MG tablet   Seasonal allergies       Relevant Medications   cetirizine (ZYRTEC) 10 MG tablet   montelukast (SINGULAIR) 10 MG tablet   Healthcare maintenance       Relevant Medications   flumazenil (ROMAZICON) injection 0.5 mg   diphtheria-acellular pertussis-tetanus (INFANRIX) injection 0.5 mL (Completed)   Other Relevant Orders   CBC with Differential (Completed)   Comprehensive metabolic panel (Completed)   Lipid Panel (Completed)   TSH (Completed)   Vitamin D, 25-hydroxy (Completed)  Vitamin B12 (Completed)   Flu Vaccine QUAD 6+ mos PF IM (Fluarix Quad PF)   Follow up          Meds ordered this encounter  Medications  . flumazenil (ROMAZICON) injection 0.5 mg  . diphtheria-acellular pertussis-tetanus (INFANRIX) injection 0.5 mL  . insulin glargine (LANTUS SOLOSTAR) 100 UNIT/ML Solostar Pen    Sig: Inject 50 Units into the skin at bedtime.    Dispense:  15 mL    Refill:  PRN  . insulin aspart (NOVOLOG) 100 UNIT/ML injection    Sig: Inject 20 Units into the skin 3 (three) times daily before meals.    Dispense:  10 mL    Refill:  11  . albuterol (VENTOLIN  HFA) 108 (90 Base) MCG/ACT inhaler    Sig: Inhale 2 puffs into the lungs every 4 (four) hours as needed for wheezing or shortness of breath.    Dispense:  8 g    Refill:  12  . carvedilol (COREG) 6.25 MG tablet    Sig: Take 1 tablet (6.25 mg total) by mouth 2 (two) times daily.    Dispense:  180 tablet    Refill:  3  . cetirizine (ZYRTEC) 10 MG tablet    Sig: Take 1 tablet (10 mg total) by mouth in the morning.    Dispense:  90 tablet    Refill:  3  . fluticasone (FLONASE) 50 MCG/ACT nasal spray    Sig: Place 1 spray into both nostrils daily.    Dispense:  16 g    Refill:  3  . ipratropium-albuterol (DUONEB) 0.5-2.5 (3) MG/3ML SOLN    Sig: Take 3 mLs by nebulization every 6 (six) hours as needed (shortness of breath, wheeze).    Dispense:  360 mL    Refill:  12  . losartan (COZAAR) 50 MG tablet    Sig: Take 1 tablet (50 mg total) by mouth daily.    Dispense:  90 tablet    Refill:  3  . montelukast (SINGULAIR) 10 MG tablet    Sig: Take 1 tablet (10 mg total) by mouth at bedtime.    Dispense:  90 tablet    Refill:  3  . pantoprazole (PROTONIX) 40 MG tablet    Sig: Take 1 tablet (40 mg total) by mouth 2 (two) times daily before a meal.    Dispense:  180 tablet    Refill:  3  . sacubitril-valsartan (ENTRESTO) 49-51 MG    Sig: Take 1 tablet by mouth 2 (two) times daily.    Dispense:  180 tablet    Refill:  3  . spironolactone (ALDACTONE) 25 MG tablet    Sig: Take 1 tablet (25 mg total) by mouth daily.    Dispense:  90 tablet    Refill:  3    Follow-up: Return in about 1 month (around 06/27/2020).    Azzie Glatter, FNP

## 2020-05-29 ENCOUNTER — Encounter: Payer: Self-pay | Admitting: Family Medicine

## 2020-05-29 ENCOUNTER — Other Ambulatory Visit: Payer: Self-pay | Admitting: Family Medicine

## 2020-05-29 DIAGNOSIS — E559 Vitamin D deficiency, unspecified: Secondary | ICD-10-CM

## 2020-05-29 DIAGNOSIS — E785 Hyperlipidemia, unspecified: Secondary | ICD-10-CM

## 2020-05-29 LAB — CBC WITH DIFFERENTIAL/PLATELET
Basophils Absolute: 0 10*3/uL (ref 0.0–0.2)
Basos: 1 %
EOS (ABSOLUTE): 0.1 10*3/uL (ref 0.0–0.4)
Eos: 2 %
Hematocrit: 43 % (ref 34.0–46.6)
Hemoglobin: 14 g/dL (ref 11.1–15.9)
Immature Grans (Abs): 0 10*3/uL (ref 0.0–0.1)
Immature Granulocytes: 0 %
Lymphocytes Absolute: 0.9 10*3/uL (ref 0.7–3.1)
Lymphs: 20 %
MCH: 29.6 pg (ref 26.6–33.0)
MCHC: 32.6 g/dL (ref 31.5–35.7)
MCV: 91 fL (ref 79–97)
Monocytes Absolute: 0.5 10*3/uL (ref 0.1–0.9)
Monocytes: 10 %
Neutrophils Absolute: 3.1 10*3/uL (ref 1.4–7.0)
Neutrophils: 67 %
Platelets: 181 10*3/uL (ref 150–450)
RBC: 4.73 x10E6/uL (ref 3.77–5.28)
RDW: 13.3 % (ref 11.7–15.4)
WBC: 4.5 10*3/uL (ref 3.4–10.8)

## 2020-05-29 LAB — COMPREHENSIVE METABOLIC PANEL
ALT: 27 IU/L (ref 0–32)
AST: 22 IU/L (ref 0–40)
Albumin/Globulin Ratio: 1.6 (ref 1.2–2.2)
Albumin: 4.4 g/dL (ref 3.8–4.9)
Alkaline Phosphatase: 90 IU/L (ref 48–121)
BUN/Creatinine Ratio: 17 (ref 9–23)
BUN: 13 mg/dL (ref 6–24)
Bilirubin Total: 0.3 mg/dL (ref 0.0–1.2)
CO2: 25 mmol/L (ref 20–29)
Calcium: 9.5 mg/dL (ref 8.7–10.2)
Chloride: 96 mmol/L (ref 96–106)
Creatinine, Ser: 0.75 mg/dL (ref 0.57–1.00)
GFR calc Af Amer: 101 mL/min/{1.73_m2} (ref >59)
GFR calc non Af Amer: 88 mL/min/{1.73_m2} (ref >59)
Globulin, Total: 2.8 g/dL (ref 1.5–4.5)
Glucose: 310 mg/dL — ABNORMAL HIGH (ref 65–99)
Potassium: 4.3 mmol/L (ref 3.5–5.2)
Sodium: 137 mmol/L (ref 134–144)
Total Protein: 7.2 g/dL (ref 6.0–8.5)

## 2020-05-29 LAB — VITAMIN B12: Vitamin B-12: 726 pg/mL (ref 232–1245)

## 2020-05-29 LAB — LIPID PANEL
Chol/HDL Ratio: 4.2 ratio (ref 0.0–4.4)
Cholesterol, Total: 250 mg/dL — ABNORMAL HIGH (ref 100–199)
HDL: 60 mg/dL (ref >39)
LDL Chol Calc (NIH): 162 mg/dL — ABNORMAL HIGH (ref 0–99)
Triglycerides: 157 mg/dL — ABNORMAL HIGH (ref 0–149)
VLDL Cholesterol Cal: 28 mg/dL (ref 5–40)

## 2020-05-29 LAB — VITAMIN D 25 HYDROXY (VIT D DEFICIENCY, FRACTURES): Vit D, 25-Hydroxy: 11.7 ng/mL — ABNORMAL LOW (ref 30.0–100.0)

## 2020-05-29 LAB — TSH: TSH: 1.02 u[IU]/mL (ref 0.450–4.500)

## 2020-05-29 MED ORDER — VITAMIN D (ERGOCALCIFEROL) 1.25 MG (50000 UNIT) PO CAPS: 50000.0000 [IU] | ORAL_CAPSULE | ORAL | 3 refills | Status: DC

## 2020-05-29 MED ORDER — ATORVASTATIN CALCIUM 20 MG PO TABS: 20.0000 mg | ORAL_TABLET | Freq: Every day | ORAL | 3 refills | Status: DC

## 2020-05-31 MED ORDER — ENTRESTO 49-51 MG PO TABS: 1.0000 | ORAL_TABLET | Freq: Two times a day (BID) | ORAL | 3 refills | Status: DC

## 2020-05-31 MED ORDER — PANTOPRAZOLE SODIUM 40 MG PO TBEC: 40.0000 mg | DELAYED_RELEASE_TABLET | Freq: Two times a day (BID) | ORAL | 3 refills | Status: DC

## 2020-05-31 MED ORDER — MONTELUKAST SODIUM 10 MG PO TABS: 10.0000 mg | ORAL_TABLET | Freq: Every day | ORAL | 3 refills | Status: DC

## 2020-05-31 MED ORDER — FLUTICASONE PROPIONATE 50 MCG/ACT NA SUSP: 1.0000 | Freq: Every day | NASAL | 3 refills | Status: DC

## 2020-05-31 MED ORDER — CETIRIZINE HCL 10 MG PO TABS: 10.0000 mg | ORAL_TABLET | Freq: Every morning | ORAL | 3 refills | Status: DC

## 2020-05-31 MED ORDER — SPIRONOLACTONE 25 MG PO TABS: 25.0000 mg | ORAL_TABLET | Freq: Every day | ORAL | 3 refills | Status: DC

## 2020-05-31 MED ORDER — IPRATROPIUM-ALBUTEROL 0.5-2.5 (3) MG/3ML IN SOLN: 3.0000 mL | Freq: Four times a day (QID) | RESPIRATORY_TRACT | 12 refills | Status: DC | PRN

## 2020-05-31 MED ORDER — ALBUTEROL SULFATE HFA 108 (90 BASE) MCG/ACT IN AERS: 2.0000 | INHALATION_SPRAY | RESPIRATORY_TRACT | 12 refills | Status: DC | PRN

## 2020-05-31 MED ORDER — LOSARTAN POTASSIUM 50 MG PO TABS: 50.0000 mg | ORAL_TABLET | Freq: Every day | ORAL | 3 refills | Status: DC

## 2020-05-31 MED ORDER — CARVEDILOL 6.25 MG PO TABS: 6.2500 mg | ORAL_TABLET | Freq: Two times a day (BID) | ORAL | 3 refills | Status: DC

## 2020-06-01 ENCOUNTER — Encounter: Payer: Self-pay | Admitting: Family Medicine

## 2020-06-02 MED ORDER — FLUPHENAZINE DECANOATE 25 MG/ML IJ SOLN
5.0000 mg | Freq: Once | INTRAMUSCULAR | Status: DC
  Administered 2020-06-02: 5 mg via INTRAMUSCULAR

## 2020-06-02 NOTE — Progress Notes (Signed)
Pt  was called @ 3:48 pm to discuss her lab results. Pt states she understood and will keep her f/u.

## 2020-06-03 ENCOUNTER — Other Ambulatory Visit: Payer: Self-pay | Admitting: Interventional Cardiology

## 2020-06-03 ENCOUNTER — Ambulatory Visit (INDEPENDENT_AMBULATORY_CARE_PROVIDER_SITE_OTHER): Payer: Medicare HMO | Admitting: Pharmacist

## 2020-06-03 ENCOUNTER — Other Ambulatory Visit: Payer: Self-pay

## 2020-06-03 VITALS — BP 152/84 | HR 78 | Wt 316.4 lb

## 2020-06-03 DIAGNOSIS — I5032 Chronic diastolic (congestive) heart failure: Secondary | ICD-10-CM

## 2020-06-03 DIAGNOSIS — I110 Hypertensive heart disease with heart failure: Secondary | ICD-10-CM

## 2020-06-03 MED ORDER — SACUBITRIL-VALSARTAN 97-103 MG PO TABS: 1.0000 | ORAL_TABLET | Freq: Two times a day (BID) | ORAL | 2 refills | Status: DC

## 2020-06-03 NOTE — Patient Instructions (Addendum)
It was good seeing you again!    We would like your blood pressure to be less than 130/80  Continue your carvedilol 6.25 mg twice a day and spironolactone 25mg  once daily  I am going to increase your Entresto to 97-103 mg which is the dose we want to keep you at.  Once you start it we will check your lab work a week later.  When you pick it up you can stop your previous prescription of Entresto.  I will call you next week  Karren Cobble, PharmD, BCACP, [place] [address], [place], [place] [address] Phone: [phone]; Fax: [phone] 06/03/2020 2:14 PM

## 2020-06-03 NOTE — Progress Notes (Signed)
Please call patient and let them  know their  low dose Ct was read as a Lung RADS 1, negative study: no nodules or definitely benign nodules. Radiology recommendation is for a repeat LDCT in 12 months..Please let them  know we will order and schedule their  annual screening scan for 05/2021. Please let them  know there was notation of CAD on their  scan.  Please remind the patient  that this is a non-gated exam therefore degree or severity of disease  cannot be determined. Please have them  follow up with their PCP regarding potential risk factor modification, dietary therapy or pharmacologic therapy if clinically indicated. Pt.  is  currently on statin therapy. Please place order for annual  screening scan for  05/2021 and fax results to PCP. Thanks so much.  

## 2020-06-03 NOTE — Progress Notes (Signed)
Patient ID: Sharon Swanson                 DOB: [birth year]                      MRN: [id number]     HPI: Sharon Swanson is a 59 y.o. female referred by Dr. Irish Lack to HTN clinic. PMH is significant for HTN, COPD, uncontrolled DM, smoking, SOB, and CHF.  Seen last month at HTN clinic and losartan was transitioned to [facility].  Patient went to ED 1 week later for hyperglycemia and BG was >500.  At PCP visit on 9/3, patient was prescribed Novolog and Lantus, however patient reports her insurance will not cover them.  Reports blood sugar has not decreased and is remaining >300.  Is not checking her blood pressure at home.    Patient has been tolerating Entresto 49-51 with no adverse effects.  Reports she feels less SOB when walking up stairs.  Remains on carvedilol, spironolactone, and furosemide.  Reports less edema, no increase in urination.  Lab work on 9/3 showed renal function WNL.    Current HTN meds: Entresto 49-51 BID, carvedilol 6.62m BID, spironolactone 25 mg daily  BP goal:  <130/80  Diet: Reports has reduced carbohydrates and is eating proteins, vegetables and drinking water and diet tea.  Home BP readings: n/a  Wt Readings from Last 3 Encounters:  05/28/20 (!) 311 lb 12.8 oz (141.4 kg)  05/26/20 (!) 310 lb (140.6 kg)  05/04/20 (!) 311 lb (141.1 kg)   BP Readings from Last 3 Encounters:  05/28/20 (!) 146/62  05/26/20 137/80  05/04/20 (!) 162/85   Pulse Readings from Last 3 Encounters:  05/28/20 87  05/26/20 86  05/04/20 94    Renal function: Estimated Creatinine Clearance: 111.8 mL/min (by C-G formula based on SCr of 0.75 mg/dL).  Past Medical History:  Diagnosis Date  . Acid reflux   . Asthma   . Bilateral lower extremity edema 01/2020  . COPD (chronic obstructive pulmonary disease) ([place]   . CPAP (continuous positive airway pressure) dependence   . Diabetes [place]   . Diabetes mellitus [place] complication [place]    Type II  . Fatty liver 11/08/2011  .  Fibroids 11/08/2011  . Hyperglycemia   . Hyperlipidemia 05/2020  . Hypertension   . Microalbuminuria 01/2020  . Obesity (BMI 30-39.9) 11/08/2011  . Osteoarthritis of right acromioclavicular joint   . Pancreatitis 11/08/2011  . Rotator cuff tear, right   . Shortness of breath on exertion 01/2020  . Sleep apnea   . Ventral hernia   . Vitamin D deficiency 05/2020    Current Outpatient Medications on File Prior to Visit  Medication Sig Dispense Refill  . albuterol (VENTOLIN HFA) 108 (90 Base) MCG/ACT inhaler Inhale 2 puffs into the lungs every 4 (four) hours as needed for wheezing or shortness of breath. 8 g 12  . atorvastatin (LIPITOR) 20 MG tablet Take 1 tablet (20 mg total) by mouth daily. 90 tablet 3  . carvedilol (COREG) 6.25 MG tablet Take 1 tablet (6.25 mg total) by mouth 2 (two) times daily. 180 tablet 3  . cephALEXin (KEFLEX) 500 MG capsule Take 1 capsule (500 mg total) by mouth 4 (four) times daily. 28 capsule 0  . cetirizine (ZYRTEC) 10 MG tablet Take 1 tablet (10 mg total) by mouth in the morning. 90 tablet 3  . docusate sodium (COLACE) 100 MG capsule Take 1 capsule (100 mg total) by mouth 3 (  three) times daily as needed. 30 capsule 3  . fluticasone (FLONASE) 50 MCG/ACT nasal spray Place 1 spray into both nostrils daily. 16 g 3  . furosemide (LASIX) 20 MG tablet Take 60 mg by mouth daily.    . furosemide (LASIX) 40 MG tablet Take 63m twice daily for 3 days then take 417mtwice daily. 70 tablet 0  . gabapentin (NEURONTIN) 300 MG capsule TAKE 1 CAPSULE (300 MG TOTAL) BY MOUTH 3 (THREE) TIMES DAILY. 90 capsule 3  . glucose monitoring kit (FREESTYLE) monitoring kit 1 each by Does not apply route as needed for other. 1 each 0  . HYDROcodone-acetaminophen (NORCO/VICODIN) 5-325 MG tablet Take 1 tablet by mouth every 6 (six) hours as needed for moderate pain. (Patient not taking: Reported on 05/28/2020) 20 tablet 0  . insulin aspart (NOVOLOG) 100 UNIT/ML injection Inject 20 Units into the  skin 3 (three) times daily before meals. 10 mL 11  . insulin glargine (LANTUS SOLOSTAR) 100 UNIT/ML Solostar Pen Inject 50 Units into the skin at bedtime. 15 mL PRN  . ipratropium-albuterol (DUONEB) 0.5-2.5 (3) MG/3ML SOLN Take 3 mLs by nebulization every 6 (six) hours as needed (shortness of breath, wheeze). 360 mL 12  . losartan (COZAAR) 50 MG tablet Take 1 tablet (50 mg total) by mouth daily. 90 tablet 3  . methocarbamol (ROBAXIN) 750 MG tablet Take 1 tablet (750 mg total) by mouth 3 (three) times daily. 90 tablet 3  . Misc. Devices (BARIATRIC ROLLATOR) MISC 1 Device by Does not apply route daily. 1 each 0  . montelukast (SINGULAIR) 10 MG tablet Take 1 tablet (10 mg total) by mouth at bedtime. 90 tablet 3  . nicotine (NICODERM CQ - DOSED IN MG/24 HOURS) 21 mg/24hr patch Place 1 patch (21 mg total) onto the skin daily. 28 patch 0  . pantoprazole (PROTONIX) 40 MG tablet Take 1 tablet (40 mg total) by mouth 2 (two) times daily before a meal. 180 tablet 3  . sacubitril-valsartan (ENTRESTO) 49-51 MG Take 1 tablet by mouth 2 (two) times daily. 180 tablet 3  . spironolactone (ALDACTONE) 25 MG tablet Take 1 tablet (25 mg total) by mouth daily. 90 tablet 3  . Tiotropium Bromide-Olodaterol (STIOLTO RESPIMAT) 2.5-2.5 MCG/ACT AERS Inhale 2 puffs into the lungs daily. 4 g 0  . varenicline (CHANTIX) 0.5 MG tablet Take 1 tablet (0.5 mg total) by mouth 2 (two) times daily. 60 tablet 3  . Vitamin D, Ergocalciferol, (DRISDOL) 1.25 MG (50000 UNIT) CAPS capsule Take 1 capsule (50,000 Units total) by mouth every 7 (seven) days. 5 capsule 3   No current facility-administered medications on file prior to visit.    Allergies  Allergen Reactions  . Aspirin Other (See Comments)    Abdominal pain     Assessment/Plan:  1. Hypertension/CHF - Patients BP in room 152/84 which is above goal of <130/80.  Patient has many other comorbidities contributing to hypertension/CHF including uncontrolled DM, COPD, smoking, and  hyperlipidemia.  Patient not clear which medications she is on but reports she is compliant.  Explained it is unlikely her BP or lipids will get to goal with uncontrolled hyperglycemia.  Upon questioning, patient is injecting Novolog 70/30 before meals rather than Novolog.  Printed formulary list for patient and discussed medications her PCP desired her to take.  Recommended she pick up all her prescriptions from one pharmacy to avoid confusion.  Patient continues to tolerate Entresto and recently refilled [id number].  Is pleased she feels she can walk  up the stairs now without being out of breath.   Recommend titrating up Entresto to maximum dose.  However patient concerned she may not be able to afford it and does not know if she used trial card.  Advised to continue Entresto 49-51 BID until she is able to see what her copay is and if she can afford it.  Advised will need to recheck lab work 1 week after starting new Entresto dose.  Will contact patient on Tuesday to check status of prescription.  Follow up in 1 month.  Karren Cobble, PharmD, BCACP, [place] [address] [place],  [address] Phone: [phone]; Fax: [phone] 06/03/2020 5:11 PM

## 2020-06-04 ENCOUNTER — Telehealth: Payer: Self-pay | Admitting: Family Medicine

## 2020-06-04 ENCOUNTER — Other Ambulatory Visit: Payer: Self-pay | Admitting: *Deleted

## 2020-06-04 DIAGNOSIS — F17210 Nicotine dependence, cigarettes, uncomplicated: Secondary | ICD-10-CM

## 2020-06-04 NOTE — Telephone Encounter (Signed)
Sent to NP 

## 2020-06-07 MED FILL — ENTRESTO 97 MG-103 MG TAB: 97-103 | 30 days supply | Qty: 60 | Fill #0

## 2020-06-08 MED FILL — NovoLOG 100 UNIT/ML SOLN: 100 | 16 days supply | Qty: 10 | Fill #0

## 2020-06-21 DIAGNOSIS — R635 Abnormal weight gain: Secondary | ICD-10-CM | POA: Diagnosis not present

## 2020-06-26 DIAGNOSIS — G4733 Obstructive sleep apnea (adult) (pediatric): Secondary | ICD-10-CM | POA: Diagnosis not present

## 2020-06-28 ENCOUNTER — Ambulatory Visit (INDEPENDENT_AMBULATORY_CARE_PROVIDER_SITE_OTHER): Payer: Medicare HMO | Admitting: Family Medicine

## 2020-06-28 ENCOUNTER — Other Ambulatory Visit: Payer: Self-pay

## 2020-06-28 DIAGNOSIS — Z794 Long term (current) use of insulin: Secondary | ICD-10-CM | POA: Diagnosis not present

## 2020-06-28 DIAGNOSIS — Z09 Encounter for follow-up examination after completed treatment for conditions other than malignant neoplasm: Secondary | ICD-10-CM

## 2020-06-28 DIAGNOSIS — Z23 Encounter for immunization: Secondary | ICD-10-CM

## 2020-06-28 DIAGNOSIS — E1165 Type 2 diabetes mellitus with hyperglycemia: Secondary | ICD-10-CM

## 2020-06-28 DIAGNOSIS — R0602 Shortness of breath: Secondary | ICD-10-CM | POA: Diagnosis not present

## 2020-06-28 DIAGNOSIS — R739 Hyperglycemia, unspecified: Secondary | ICD-10-CM

## 2020-06-28 DIAGNOSIS — E785 Hyperlipidemia, unspecified: Secondary | ICD-10-CM

## 2020-06-28 DIAGNOSIS — R7309 Other abnormal glucose: Secondary | ICD-10-CM | POA: Diagnosis not present

## 2020-06-28 DIAGNOSIS — R600 Localized edema: Secondary | ICD-10-CM

## 2020-06-28 DIAGNOSIS — I10 Essential (primary) hypertension: Secondary | ICD-10-CM | POA: Diagnosis not present

## 2020-06-28 LAB — GLUCOSE, POCT (MANUAL RESULT ENTRY): POC Glucose: 204 mg/dl — AB (ref 70–99)

## 2020-06-28 MED ORDER — FUROSEMIDE 40 MG PO TABS: 40.0000 mg | ORAL_TABLET | Freq: Two times a day (BID) | ORAL | 2 refills | Status: DC

## 2020-06-28 NOTE — Progress Notes (Signed)
Patient [facility]    Established Patient Office Visit  Subjective:  Patient ID: Sharon Swanson, female    DOB: 09/29/1960  Age: 59 y.o. MRN: [id number]  CC:  Chief Complaint  Patient presents with  . Follow-up    Pt states she is here to discuss getting her new BS montior because her fingers are sore. Pt states she has some bumps on both legs that are turning her legs turn black. X 3-4 months.  . Back Pain    Pt states when she stands up for a long period of time she is having bad pain.X1MONTH.    HPI Sharon Swanson is a 59 year old female who presents for Follow Up today.  Patient Active Problem List   Diagnosis Date Noted  . Bilateral lower extremity edema 02/25/2020  . Shortness of breath on exertion 02/25/2020  . Dependence on continuous supplemental oxygen 02/25/2020  . Abdominal pain, acute, periumbilical 40/98/1191  . Hemoglobin A1C greater than 9%, indicating poor diabetic control 10/19/2019  . Class 3 severe obesity due to excess calories with serious comorbidity and body mass index (BMI) of 45.0 to 49.9 in adult [facility]) 09/21/2019  . Hemoglobin A1C between 7% and 9% indicating borderline diabetic control 09/21/2019  . Chronic respiratory failure ([place]) 08/02/2019  . Diastolic dysfunction 47/82/9562  . Cellulitis 08/02/2019  . Insulin-requiring or dependent type II diabetes mellitus ([place]) 01/19/2017  . Essential hypertension 01/19/2017  . Anxiety 01/19/2017  . Chest pain 01/19/2017  . OSA (obstructive sleep apnea) 01/19/2017  . COPD (chronic obstructive pulmonary disease) ([place])   . Acute respiratory failure ([place]) 08/17/2014  . Umbilical hernia 13/04/6577  . Pulmonary nodule, left 08/17/2014  . Constipation, chronic 08/15/2012  . GERD (gastroesophageal reflux disease) 11/08/2011  . Tobacco abuse 11/08/2011  . Fatty liver 11/08/2011  . BMI 45.0-49.9, adult ([place]) 11/08/2011  . Fibroids 11/08/2011   Current Status: Since  her last office visit, she is doing well with no complaints.  She has c/o back pain when she is standing and relieved when she is sitting down. She has currently joined weight loss class. Her goal is to loss weight in preparation for further back surgery. She has began to develop bilateral lower extremity edema and ulcers. Her most recent normal range of preprandial blood glucose levels have been between 200-225. She has seen low range of 200 and high of 300 since his last office visit. She denies fatigue, frequent urination, blurred vision, excessive hunger, excessive thirst, weight gain, weight loss, and poor wound healing. She continues to check her feet regularly. She denies visual changes, chest pain, cough, shortness of breath, heart palpitations, and falls. She has occasional headaches and dizziness with position changes. Denies severe headaches, confusion, seizures, double vision, and blurred vision, nausea and vomiting.    She denies fevers, chills, recent infections, weight loss, and night sweats. Denies GI problems such as diarrhea, and constipation. She has no reports of blood in stools, dysuria and hematuria. No depression or anxiety reported today. She is taking all medications as prescribed.   Past Medical History:  Diagnosis Date  . Acid reflux   . Asthma   . Bilateral lower extremity edema 01/2020  . COPD (chronic obstructive pulmonary disease) ([place])   . CPAP (continuous positive airway pressure) dependence   . Diabetes ([place])   . Diabetes mellitus without complication ([place])    Type II  . Fatty liver 11/08/2011  . Fibroids 11/08/2011  . Hyperglycemia   .  Hyperlipidemia 05/2020  . Hypertension   . Microalbuminuria 01/2020  . Obesity (BMI 30-39.9) 11/08/2011  . Osteoarthritis of right acromioclavicular joint   . Pancreatitis 11/08/2011  . Rotator cuff tear, right   . Shortness of breath on exertion 01/2020  . Sleep apnea   . Ventral hernia   . Vitamin D deficiency 05/2020     Past Surgical History:  Procedure Laterality Date  . ORTHOPEDIC SURGERY     R ankle, tendonitis    Family History  Problem Relation Age of Onset  . Hypertension Mother   . Diabetes Other     Social History   Socioeconomic History  . Marital status: Married    Spouse name: Not on file  . Number of children: Not on file  . Years of education: Not on file  . Highest education level: Not on file  Occupational History  . Not on file  Tobacco Use  . Smoking status: Current Every Day Smoker    Packs/day: 0.50    Years: 39.00    Pack years: 19.50    Types: Cigarettes    Start date: 09/25/1980  . Smokeless tobacco: Never Used  . Tobacco comment: pt states she smoke 2 cigerettes a day  Vaping Use  . Vaping Use: Never used  Substance and Sexual Activity  . Alcohol use: No  . Drug use: No  . Sexual activity: Not Currently  Other Topics Concern  . Not on file  Social History Narrative  . Not on file   Social Determinants of Health   Financial Resource Strain:   . Difficulty of Paying Living Expenses: Not on file  Food Insecurity:   . Worried About Charity fundraiser in the Last Year: Not on file  . Ran Out of Food in the Last Year: Not on file  Transportation Needs:   . Lack of Transportation (Medical): Not on file  . Lack of Transportation (Non-Medical): Not on file  Physical Activity:   . Days of Exercise per Week: Not on file  . Minutes of Exercise per Session: Not on file  Stress:   . Feeling of Stress : Not on file  Social Connections:   . Frequency of Communication with Friends and Family: Not on file  . Frequency of Social Gatherings with Friends and Family: Not on file  . Attends Religious Services: Not on file  . Active Member of Clubs or Organizations: Not on file  . Attends Archivist Meetings: Not on file  . Marital Status: Not on file  Intimate Partner Violence:   . Fear of Current or Ex-Partner: Not on file  . Emotionally Abused: Not  on file  . Physically Abused: Not on file  . Sexually Abused: Not on file    Outpatient Medications Prior to Visit  Medication Sig Dispense Refill  . albuterol (VENTOLIN HFA) 108 (90 Base) MCG/ACT inhaler Inhale 2 puffs into the lungs every 4 (four) hours as needed for wheezing or shortness of breath. 8 g 12  . carvedilol (COREG) 6.25 MG tablet Take 1 tablet (6.25 mg total) by mouth 2 (two) times daily. 180 tablet 3  . cetirizine (ZYRTEC) 10 MG tablet Take 1 tablet (10 mg total) by mouth in the morning. 90 tablet 3  . docusate sodium (COLACE) 100 MG capsule Take 1 capsule (100 mg total) by mouth 3 (three) times daily as needed. 30 capsule 3  . fluticasone (FLONASE) 50 MCG/ACT nasal spray Place 1 spray into both nostrils  daily. 16 g 3  . gabapentin (NEURONTIN) 300 MG capsule TAKE 1 CAPSULE (300 MG TOTAL) BY MOUTH 3 (THREE) TIMES DAILY. 90 capsule 3  . glucose monitoring kit (FREESTYLE) monitoring kit 1 each by Does not apply route as needed for other. 1 each 0  . insulin aspart (NOVOLOG) 100 UNIT/ML injection Inject 20 Units into the skin 3 (three) times daily before meals. 10 mL 11  . insulin glargine (LANTUS SOLOSTAR) 100 UNIT/ML Solostar Pen Inject 50 Units into the skin at bedtime. 15 mL PRN  . ipratropium-albuterol (DUONEB) 0.5-2.5 (3) MG/3ML SOLN Take 3 mLs by nebulization every 6 (six) hours as needed (shortness of breath, wheeze). 360 mL 12  . methocarbamol (ROBAXIN) 750 MG tablet Take 1 tablet (750 mg total) by mouth 3 (three) times daily. 90 tablet 3  . Misc. Devices (BARIATRIC ROLLATOR) MISC 1 Device by Does not apply route daily. 1 each 0  . montelukast (SINGULAIR) 10 MG tablet Take 1 tablet (10 mg total) by mouth at bedtime. 90 tablet 3  . nicotine (NICODERM CQ - DOSED IN MG/24 HOURS) 21 mg/24hr patch Place 1 patch (21 mg total) onto the skin daily. 28 patch 0  . pantoprazole (PROTONIX) 40 MG tablet Take 1 tablet (40 mg total) by mouth 2 (two) times daily before a meal. 180 tablet 3   . sacubitril-valsartan (ENTRESTO) 97-103 MG Take 1 tablet by mouth 2 (two) times daily for 180 doses. 60 tablet 2  . spironolactone (ALDACTONE) 25 MG tablet Take 1 tablet (25 mg total) by mouth daily. 90 tablet 3  . Tiotropium Bromide-Olodaterol (STIOLTO RESPIMAT) 2.5-2.5 MCG/ACT AERS Inhale 2 puffs into the lungs daily. 4 g 0  . varenicline (CHANTIX) 0.5 MG tablet Take 1 tablet (0.5 mg total) by mouth 2 (two) times daily. 60 tablet 3  . Vitamin D, Ergocalciferol, (DRISDOL) 1.25 MG (50000 UNIT) CAPS capsule Take 1 capsule (50,000 Units total) by mouth every 7 (seven) days. 5 capsule 3  . cephALEXin (KEFLEX) 500 MG capsule Take 1 capsule (500 mg total) by mouth 4 (four) times daily. 28 capsule 0  . furosemide (LASIX) 40 MG tablet Take $RemoveBef'80mg'zmfaitkkfD$  twice daily for 3 days then take $RemoveB'40mg'IGNRyPib$  twice daily. 70 tablet 0  . HYDROcodone-acetaminophen (NORCO/VICODIN) 5-325 MG tablet Take 1 tablet by mouth every 6 (six) hours as needed for moderate pain. 20 tablet 0  . atorvastatin (LIPITOR) 20 MG tablet Take 1 tablet (20 mg total) by mouth daily. (Patient not taking: Reported on 06/28/2020) 90 tablet 3   No facility-administered medications prior to visit.    Allergies  Allergen Reactions  . Aspirin Other (See Comments)    Abdominal pain    ROS Review of Systems  Constitutional: Negative.   HENT: Negative.   Eyes: Negative.   Respiratory: Negative.   Cardiovascular: Negative.   Gastrointestinal: Positive for abdominal distention.  Endocrine: Negative.   Genitourinary: Negative.   Musculoskeletal: Positive for arthralgias (generalized joint pain) and back pain.  Skin: Negative.   Allergic/Immunologic: Negative.   Neurological: Positive for dizziness (occasional ) and headaches (occasional ).  Hematological: Negative.   Psychiatric/Behavioral: Negative.       Objective:    Physical Exam Vitals and nursing note reviewed.  Constitutional:      Appearance: Normal appearance.  HENT:     Head:  Normocephalic and atraumatic.     Nose: Nose normal.     Mouth/Throat:     Mouth: Mucous membranes are moist.     Pharynx: Oropharynx  is clear.  Cardiovascular:     Rate and Rhythm: Normal rate and regular rhythm.     Pulses: Normal pulses.     Heart sounds: Normal heart sounds.  Pulmonary:     Effort: Pulmonary effort is normal.     Breath sounds: Normal breath sounds.  Abdominal:     General: Bowel sounds are normal. There is distension (obese).     Palpations: Abdomen is soft.  Musculoskeletal:        General: Normal range of motion.     Cervical back: Normal range of motion and neck supple.  Skin:    General: Skin is warm and dry.  Neurological:     General: No focal deficit present.     Mental Status: She is alert and oriented to person, place, and time.  Psychiatric:        Mood and Affect: Mood normal.        Behavior: Behavior normal.        Thought Content: Thought content normal.        Judgment: Judgment normal.     There were no vitals taken for this visit. Wt Readings from Last 3 Encounters:  06/03/20 (!) 316 lb 6.4 oz (143.5 kg)  05/28/20 (!) 311 lb 12.8 oz (141.4 kg)  05/26/20 (!) 310 lb (140.6 kg)     Health Maintenance Due  Topic Date Due  . Hepatitis C Screening  Never done  . FOOT EXAM  Never done  . OPHTHALMOLOGY EXAM  Never done  . TETANUS/TDAP  Never done  . PAP SMEAR-Modifier  Never done  . MAMMOGRAM  Never done    There are no preventive care reminders to display for this patient.  Lab Results  Component Value Date   TSH 1.020 05/28/2020   Lab Results  Component Value Date   WBC 4.5 05/28/2020   HGB 14.0 05/28/2020   HCT 43.0 05/28/2020   MCV 91 05/28/2020   PLT 181 05/28/2020   Lab Results  Component Value Date   NA 137 05/28/2020   K 4.3 05/28/2020   CO2 25 05/28/2020   GLUCOSE 310 (H) 05/28/2020   BUN 13 05/28/2020   CREATININE 0.75 05/28/2020   BILITOT 0.3 05/28/2020   ALKPHOS 90 05/28/2020   AST 22 05/28/2020   ALT  27 05/28/2020   PROT 7.2 05/28/2020   ALBUMIN 4.4 05/28/2020   CALCIUM 9.5 05/28/2020   ANIONGAP 8 05/04/2020   GFR 128.72 03/01/2020   Lab Results  Component Value Date   CHOL 250 (H) 05/28/2020   Lab Results  Component Value Date   HDL 60 05/28/2020   Lab Results  Component Value Date   LDLCALC 162 (H) 05/28/2020   Lab Results  Component Value Date   TRIG 157 (H) 05/28/2020   Lab Results  Component Value Date   CHOLHDL 4.2 05/28/2020   Lab Results  Component Value Date   HGBA1C 12.1 (A) 05/28/2020   HGBA1C 12.1 05/28/2020   HGBA1C 12.1 (A) 05/28/2020   HGBA1C 12.1 (A) 05/28/2020    Assessment & Plan:   1. Type 2 diabetes mellitus with hyperglycemia, with long-term current use of insulin (HCC) She will continue medication as prescribed, to decrease foods/beverages high in sugars and carbs and follow Heart Healthy or DASH diet. Increase physical activity to at least 30 minutes cardio exercise daily.  - POC Glucose (CBG)  2. Hemoglobin A1C greater than 9% indicating poor diabetic control Hgb A1c increased at 12.1 today,  from 8.0 on 01/19/2020. Monitor.  3. Hyperglycemia  4. Essential hypertension She will continue to take medications as prescribed, to decrease high sodium intake, excessive alcohol intake, increase potassium intake, smoking cessation, and increase physical activity of at least 30 minutes of cardio activity daily. She will continue to follow Heart Healthy or DASH diet.  5. Hyperlipidemia, unspecified hyperlipidemia type  6. Bilateral lower extremity edema  7. Shortness of breath on exertion Stable. No signs or symptoms of respiratory distress noted or reported.   8. Need for 23-polyvalent pneumococcal polysaccharide vaccine - Pneumococcal polysaccharide vaccine 23-valent greater than or equal to 2yo subcutaneous/IM  9. Follow up She will follow up in 3 months.   Meds ordered this encounter  Medications  . furosemide (LASIX) 40 MG tablet     Sig: Take 1 tablet (40 mg total) by mouth 2 (two) times daily.    Dispense:  60 tablet    Refill:  2    Orders Placed This Encounter  Procedures  . Pneumococcal polysaccharide vaccine 23-valent greater than or equal to 2yo subcutaneous/IM  . POC Glucose (CBG)    Referral Orders  No referral(s) requested today    Kathe Becton,  MSN, FNP-BC [address] [place], [place] [address] [phone] fax    Problem List Items Addressed This Visit      Cardiovascular and Mediastinum   Essential hypertension   Relevant Medications   furosemide (LASIX) 40 MG tablet     Endocrine   Hemoglobin A1C between 7% and 9% indicating borderline diabetic control     Other   Bilateral lower extremity edema   Shortness of breath on exertion    Other Visit Diagnoses    Type 2 diabetes mellitus with hyperglycemia, with long-term current use of insulin (HCC)    -  Primary   Relevant Orders   POC Glucose (CBG) (Completed)   Hyperglycemia       Hyperlipidemia, unspecified hyperlipidemia type       Relevant Medications   furosemide (LASIX) 40 MG tablet   Need for 23-polyvalent pneumococcal polysaccharide vaccine       Relevant Orders   Pneumococcal polysaccharide vaccine 23-valent greater than or equal to 2yo subcutaneous/IM (Completed)   Follow up          Meds ordered this encounter  Medications  . furosemide (LASIX) 40 MG tablet    Sig: Take 1 tablet (40 mg total) by mouth 2 (two) times daily.    Dispense:  60 tablet    Refill:  2    Follow-up: Return in about 2 months (around 08/28/2020).    Azzie Glatter, FNP

## 2020-06-29 ENCOUNTER — Other Ambulatory Visit: Payer: Self-pay | Admitting: Family Medicine

## 2020-06-29 MED FILL — MONTELUKAST SOD 10 MG TAB: 10 | 30 days supply | Qty: 30 | Fill #2

## 2020-06-29 MED FILL — ALBUTEROL SULFATE HFA 108 (: 108 (90 BAS | 16 days supply | Qty: 18 | Fill #2

## 2020-06-29 MED FILL — CETIRIZINE HCL 10 MG TABS: 10 | 30 days supply | Qty: 30 | Fill #0

## 2020-06-29 MED FILL — GABAPENTIN 300 MG CAPSULE: 300 | 30 days supply | Qty: 90 | Fill #3

## 2020-06-29 MED FILL — FLUTICASONE PROP 50 MCG SPR: 50 | 30 days supply | Qty: 16 | Fill #3

## 2020-06-29 MED FILL — METHOCARBAMOL 500 MG TABS: 500 | 30 days supply | Qty: 90 | Fill #1

## 2020-06-29 MED FILL — PANTOPRAZOLE SOD DR 40 MG T: 40 | 30 days supply | Qty: 60 | Fill #2

## 2020-06-29 MED FILL — IPRAT-ALBUT 0.5-3(2.5) MG/3: 0.5-2.5 (3) | 30 days supply | Qty: 360 | Fill #2

## 2020-07-01 ENCOUNTER — Encounter: Payer: Self-pay | Admitting: Family Medicine

## 2020-07-08 DIAGNOSIS — G4733 Obstructive sleep apnea (adult) (pediatric): Secondary | ICD-10-CM | POA: Diagnosis not present

## 2020-07-08 DIAGNOSIS — Z6841 Body Mass Index (BMI) 40.0 and over, adult: Secondary | ICD-10-CM | POA: Diagnosis not present

## 2020-07-08 DIAGNOSIS — Z720 Tobacco use: Secondary | ICD-10-CM | POA: Diagnosis not present

## 2020-07-08 DIAGNOSIS — E119 Type 2 diabetes mellitus without complications: Secondary | ICD-10-CM | POA: Diagnosis not present

## 2020-07-08 DIAGNOSIS — I10 Essential (primary) hypertension: Secondary | ICD-10-CM | POA: Diagnosis not present

## 2020-07-08 DIAGNOSIS — J431 Panlobular emphysema: Secondary | ICD-10-CM | POA: Diagnosis not present

## 2020-07-08 DIAGNOSIS — R635 Abnormal weight gain: Secondary | ICD-10-CM | POA: Diagnosis not present

## 2020-07-08 DIAGNOSIS — K760 Fatty (change of) liver, not elsewhere classified: Secondary | ICD-10-CM | POA: Diagnosis not present

## 2020-07-13 ENCOUNTER — Encounter: Payer: Self-pay | Admitting: Family Medicine

## 2020-07-27 DIAGNOSIS — G4733 Obstructive sleep apnea (adult) (pediatric): Secondary | ICD-10-CM | POA: Diagnosis not present

## 2020-08-11 ENCOUNTER — Other Ambulatory Visit: Payer: Self-pay | Admitting: Family Medicine

## 2020-08-25 MED FILL — SPIRONOLACTONE 25 MG TABLET: 25 | 30 days supply | Qty: 30 | Fill #1

## 2020-08-26 DIAGNOSIS — G4733 Obstructive sleep apnea (adult) (pediatric): Secondary | ICD-10-CM | POA: Diagnosis not present

## 2020-08-28 ENCOUNTER — Other Ambulatory Visit: Payer: Self-pay | Admitting: Family Medicine

## 2020-08-29 ENCOUNTER — Other Ambulatory Visit (HOSPITAL_COMMUNITY): Payer: Self-pay | Admitting: Emergency Medicine

## 2020-08-29 ENCOUNTER — Emergency Department (HOSPITAL_COMMUNITY)
Admission: EM | Admit: 2020-08-29 | Discharge: 2020-08-29 | Disposition: A | Discharge status: Home / Self Care | Payer: Medicare HMO | Attending: Emergency Medicine | Admitting: Emergency Medicine

## 2020-08-29 ENCOUNTER — Emergency Department (HOSPITAL_COMMUNITY): Payer: Medicare HMO

## 2020-08-29 ENCOUNTER — Other Ambulatory Visit: Payer: Self-pay

## 2020-08-29 DIAGNOSIS — J449 Chronic obstructive pulmonary disease, unspecified: Secondary | ICD-10-CM | POA: Insufficient documentation

## 2020-08-29 DIAGNOSIS — R202 Paresthesia of skin: Secondary | ICD-10-CM | POA: Diagnosis not present

## 2020-08-29 DIAGNOSIS — R531 Weakness: Secondary | ICD-10-CM | POA: Diagnosis not present

## 2020-08-29 DIAGNOSIS — M79605 Pain in left leg: Secondary | ICD-10-CM | POA: Diagnosis not present

## 2020-08-29 DIAGNOSIS — Z79899 Other long term (current) drug therapy: Secondary | ICD-10-CM | POA: Diagnosis not present

## 2020-08-29 DIAGNOSIS — Z794 Long term (current) use of insulin: Secondary | ICD-10-CM | POA: Insufficient documentation

## 2020-08-29 DIAGNOSIS — E1165 Type 2 diabetes mellitus with hyperglycemia: Secondary | ICD-10-CM | POA: Insufficient documentation

## 2020-08-29 DIAGNOSIS — E785 Hyperlipidemia, unspecified: Secondary | ICD-10-CM | POA: Insufficient documentation

## 2020-08-29 DIAGNOSIS — Z7951 Long term (current) use of inhaled steroids: Secondary | ICD-10-CM | POA: Diagnosis not present

## 2020-08-29 DIAGNOSIS — M5459 Other low back pain: Secondary | ICD-10-CM | POA: Diagnosis not present

## 2020-08-29 DIAGNOSIS — E1129 Type 2 diabetes mellitus with other diabetic kidney complication: Secondary | ICD-10-CM | POA: Diagnosis not present

## 2020-08-29 DIAGNOSIS — M545 Low back pain, unspecified: Secondary | ICD-10-CM | POA: Diagnosis not present

## 2020-08-29 DIAGNOSIS — F17210 Nicotine dependence, cigarettes, uncomplicated: Secondary | ICD-10-CM | POA: Diagnosis not present

## 2020-08-29 DIAGNOSIS — E1169 Type 2 diabetes mellitus with other specified complication: Secondary | ICD-10-CM | POA: Diagnosis not present

## 2020-08-29 LAB — CBC WITH DIFFERENTIAL/PLATELET
Abs Immature Granulocytes: 0.02 10*3/uL (ref 0.00–0.07)
Basophils Absolute: 0 10*3/uL (ref 0.0–0.1)
Basophils Relative: 1 %
Eosinophils Absolute: 0.3 10*3/uL (ref 0.0–0.5)
Eosinophils Relative: 5 %
HCT: 44.3 % (ref 36.0–46.0)
Hemoglobin: 14.1 g/dL (ref 12.0–15.0)
Immature Granulocytes: 0 %
Lymphocytes Relative: 18 %
Lymphs Abs: 1 10*3/uL (ref 0.7–4.0)
MCH: 29 pg (ref 26.0–34.0)
MCHC: 31.8 g/dL (ref 30.0–36.0)
MCV: 91 fL (ref 80.0–100.0)
Monocytes Absolute: 0.6 10*3/uL (ref 0.1–1.0)
Monocytes Relative: 10 %
Neutro Abs: 3.7 10*3/uL (ref 1.7–7.7)
Neutrophils Relative %: 66 %
Platelets: 184 10*3/uL (ref 150–400)
RBC: 4.87 MIL/uL (ref 3.87–5.11)
RDW: 12.8 % (ref 11.5–15.5)
WBC: 5.5 10*3/uL (ref 4.0–10.5)
nRBC: 0 % (ref 0.0–0.2)

## 2020-08-29 LAB — BASIC METABOLIC PANEL
Anion gap: 9 (ref 5–15)
BUN: 19 mg/dL (ref 6–20)
CO2: 30 mmol/L (ref 22–32)
Calcium: 9.6 mg/dL (ref 8.9–10.3)
Chloride: 93 mmol/L — ABNORMAL LOW (ref 98–111)
Creatinine, Ser: 0.76 mg/dL (ref 0.44–1.00)
GFR, Estimated: >60 mL/min (ref >60)
Glucose, Bld: 541 mg/dL (ref 70–99)
Potassium: 4.3 mmol/L (ref 3.5–5.1)
Sodium: 132 mmol/L — ABNORMAL LOW (ref 135–145)

## 2020-08-29 LAB — URINALYSIS, ROUTINE W REFLEX MICROSCOPIC
Bilirubin Urine: NEGATIVE
Glucose, UA: >=500 mg/dL — AB
Hgb urine dipstick: NEGATIVE
Ketones, ur: 5 mg/dL — AB
Nitrite: NEGATIVE
Protein, ur: NEGATIVE mg/dL
Specific Gravity, Urine: 1.025 (ref 1.005–1.030)
pH: 7 (ref 5.0–8.0)

## 2020-08-29 LAB — BLOOD GAS, VENOUS
Acid-Base Excess: 4.3 mmol/L — ABNORMAL HIGH (ref 0.0–2.0)
Bicarbonate: 30.7 mmol/L — ABNORMAL HIGH (ref 20.0–28.0)
O2 Saturation: 93.3 %
Patient temperature: 98.6
pCO2, Ven: 54.8 mmHg (ref 44.0–60.0)
pH, Ven: 7.366 (ref 7.250–7.430)
pO2, Ven: 70.3 mmHg — ABNORMAL HIGH (ref 32.0–45.0)

## 2020-08-29 LAB — CBG MONITORING, ED
Glucose-Capillary: 478 mg/dL — ABNORMAL HIGH (ref 70–99)
Glucose-Capillary: 421 mg/dL — ABNORMAL HIGH (ref 70–99)
Glucose-Capillary: 388 mg/dL — ABNORMAL HIGH (ref 70–99)

## 2020-08-29 MED ORDER — OXYCODONE-ACETAMINOPHEN 5-325 MG PO TABS
2.0000 | ORAL_TABLET | Freq: Once | ORAL | Status: AC
  Administered 2020-08-29: 2 via ORAL
  Filled 2020-08-29: qty 2

## 2020-08-29 MED ORDER — SODIUM CHLORIDE 0.9 % IV BOLUS
1000.0000 mL | Freq: Once | INTRAVENOUS | Status: AC
  Administered 2020-08-29: 1000 mL via INTRAVENOUS

## 2020-08-29 MED ORDER — METHOCARBAMOL 750 MG PO TABS: 750.0000 mg | ORAL_TABLET | Freq: Four times a day (QID) | ORAL | 0 refills | Status: DC

## 2020-08-29 MED ORDER — OXYCODONE-ACETAMINOPHEN 5-325 MG PO TABS
1.0000 | ORAL_TABLET | ORAL | 0 refills | Status: DC | PRN
Start: 2020-08-29 — End: 2020-10-04

## 2020-08-29 MED ORDER — METHOCARBAMOL 1000 MG/10ML IJ SOLN
1000.0000 mg | Freq: Once | INTRAMUSCULAR | Status: DC
  Filled 2020-08-29: qty 10

## 2020-08-29 MED ORDER — METHOCARBAMOL 1000 MG/10ML IJ SOLN: 1000.0000 mg | Freq: Once | INTRAMUSCULAR | Status: DC

## 2020-08-29 MED ORDER — INSULIN ASPART 100 UNIT/ML ~~LOC~~ SOLN
10.0000 [IU] | Freq: Once | SUBCUTANEOUS | Status: AC
  Administered 2020-08-29: 10 [IU] via SUBCUTANEOUS
  Filled 2020-08-29: qty 0.1

## 2020-08-29 MED ORDER — MORPHINE SULFATE (PF) 4 MG/ML IV SOLN
6.0000 mg | Freq: Once | INTRAVENOUS | Status: AC
  Administered 2020-08-29: 6 mg via INTRAVENOUS
  Filled 2020-08-29: qty 2

## 2020-08-29 MED ORDER — SODIUM CHLORIDE 0.9 % IV SOLN: INTRAVENOUS | Status: DC

## 2020-08-29 MED ORDER — METHOCARBAMOL 1000 MG/10ML IJ SOLN
1000.0000 mg | Freq: Once | INTRAVENOUS | Status: AC
  Administered 2020-08-29: 1000 mg via INTRAVENOUS
  Filled 2020-08-29: qty 1000

## 2020-08-29 NOTE — ED Triage Notes (Signed)
Patient reports right sided back pain. Patient reports the pain runs down her back to her feet. Patient reports her sugar was also over 500 this morning. Patient reports sitting up and laying down hurts really bad and she has not been able to go to the [facility],

## 2020-08-29 NOTE — ED Notes (Signed)
Date and time results received: 08/29/20 10:06 AM  (use smartphrase ".now" to insert current time)  Test: glucose Critical Value: 541  Name of Provider Notified: Zenia Resides  Orders Received? Or Actions Taken?: See orders

## 2020-08-29 NOTE — ED Provider Notes (Signed)
[facility] DEPT Provider Note   CSN: [id number] Arrival date & time: 08/29/20  0749     History Chief Complaint  Patient presents with  . Back Pain  . Hyperglycemia    Sharon Swanson is a 59 y.o. female.  59 year old female presents with 2 weeks of left leg pain and lower back discomfort.  States that her lower back pain is worse with sitting and when she tries to stand up.  Pain starts in her back and goes onto her right leg.  Denies any foot drop.  Has had history of slipped disc before in her back.  Denies any new injuries.  No bowel or bladder dysfunction.  Patient has not been using any treatments for this.  Symptoms are better with remaining still also notes right arm paresthesias x2 to 3 days.  Denies any associated weakness to her hand.  States that she is able to still grasp things appropriately.  Denies any other focal weakness.  No associated neck discomfort.        Past Medical History:  Diagnosis Date  . Acid reflux   . Asthma   . Bilateral lower extremity edema 01/2020  . COPD (chronic obstructive pulmonary disease) ([place])   . CPAP (continuous positive airway pressure) dependence   . Diabetes ([place])   . Diabetes mellitus without complication ([place])    Type II  . Fatty liver 11/08/2011  . Fibroids 11/08/2011  . Hyperglycemia   . Hyperlipidemia 05/2020  . Hypertension   . Microalbuminuria 01/2020  . Obesity (BMI 30-39.9) 11/08/2011  . Osteoarthritis of right acromioclavicular joint   . Pancreatitis 11/08/2011  . Rotator cuff tear, right   . Shortness of breath on exertion 01/2020  . Sleep apnea   . Ventral hernia   . Vitamin D deficiency 05/2020    Patient Active Problem List   Diagnosis Date Noted  . Bilateral lower extremity edema 02/25/2020  . Shortness of breath on exertion 02/25/2020  . Dependence on continuous supplemental oxygen 02/25/2020  . Abdominal pain, acute, periumbilical 57/89/7847  . Hemoglobin A1C greater  than 9%, indicating poor diabetic control 10/19/2019  . Class 3 severe obesity due to excess calories with serious comorbidity and body mass index (BMI) of 45.0 to 49.9 in adult [facility]) 09/21/2019  . Hemoglobin A1C between 7% and 9% indicating borderline diabetic control 09/21/2019  . Chronic respiratory failure ([place]) 08/02/2019  . Diastolic dysfunction 84/08/8207  . Cellulitis 08/02/2019  . Insulin-requiring or dependent type II diabetes mellitus ([place]) 01/19/2017  . Essential hypertension 01/19/2017  . Anxiety 01/19/2017  . Chest pain 01/19/2017  . OSA (obstructive sleep apnea) 01/19/2017  . COPD (chronic obstructive pulmonary disease) ([place])   . Acute respiratory failure ([place]) 08/17/2014  . Umbilical hernia 13/88/7195  . Pulmonary nodule, left 08/17/2014  . Constipation, chronic 08/15/2012  . GERD (gastroesophageal reflux disease) 11/08/2011  . Tobacco abuse 11/08/2011  . Fatty liver 11/08/2011  . BMI 45.0-49.9, adult ([place]) 11/08/2011  . Fibroids 11/08/2011    Past Surgical History:  Procedure Laterality Date  . ORTHOPEDIC SURGERY     R ankle, tendonitis     OB History    Gravida  1   Para  0   Term  0   Preterm  0   AB  0   Living  0     SAB  0   TAB  0   Ectopic  0   Multiple  0   Live Births  Family History  Problem Relation Age of Onset  . Hypertension Mother   . Diabetes Other     Social History   Tobacco Use  . Smoking status: Current Every Day Smoker    Packs/day: 0.50    Years: 39.00    Pack years: 19.50    Types: Cigarettes    Start date: 09/25/1980  . Smokeless tobacco: Never Used  . Tobacco comment: pt states she smoke 2 cigerettes a day  Vaping Use  . Vaping Use: Never used  Substance Use Topics  . Alcohol use: No  . Drug use: No    Home Medications Prior to Admission medications   Medication Sig Start Date End Date Taking? Authorizing Provider  albuterol (VENTOLIN HFA) 108 (90 Base) MCG/ACT inhaler Inhale 2  puffs into the lungs every 4 (four) hours as needed for wheezing or shortness of breath. 05/31/20   Azzie Glatter, FNP  atorvastatin (LIPITOR) 20 MG tablet Take 1 tablet (20 mg total) by mouth daily. Patient not taking: Reported on 06/28/2020 05/29/20   Azzie Glatter, FNP  carvedilol (COREG) 6.25 MG tablet Take 1 tablet (6.25 mg total) by mouth 2 (two) times daily. 05/31/20   Azzie Glatter, FNP  cetirizine (ZYRTEC) 10 MG tablet Take 1 tablet (10 mg total) by mouth in the morning. 05/31/20   Azzie Glatter, FNP  docusate sodium (COLACE) 100 MG capsule Take 1 capsule (100 mg total) by mouth 3 (three) times daily as needed. 09/16/19   Azzie Glatter, FNP  fluticasone (FLONASE) 50 MCG/ACT nasal spray Place 1 spray into both nostrils daily. 05/31/20   Azzie Glatter, FNP  furosemide (LASIX) 40 MG tablet Take 1 tablet (40 mg total) by mouth 2 (two) times daily. 06/28/20   Azzie Glatter, FNP  gabapentin (NEURONTIN) 300 MG capsule TAKE 1 CAPSULE (300 MG TOTAL) BY MOUTH 3 (THREE) TIMES DAILY. 02/25/20   Azzie Glatter, FNP  glucose monitoring kit (FREESTYLE) monitoring kit 1 each by Does not apply route as needed for other. 09/16/19   Azzie Glatter, FNP  insulin aspart (NOVOLOG) 100 UNIT/ML injection Inject 20 Units into the skin 3 (three) times daily before meals. 05/28/20   Azzie Glatter, FNP  insulin glargine (LANTUS SOLOSTAR) 100 UNIT/ML Solostar Pen Inject 50 Units into the skin at bedtime. 05/28/20   Azzie Glatter, FNP  ipratropium-albuterol (DUONEB) 0.5-2.5 (3) MG/3ML SOLN Take 3 mLs by nebulization every 6 (six) hours as needed (shortness of breath, wheeze). 05/31/20 06/25/21  Azzie Glatter, FNP  methocarbamol (ROBAXIN) 750 MG tablet Take 1 tablet (750 mg total) by mouth 3 (three) times daily. 02/18/20   Azzie Glatter, FNP  Misc. Devices (BARIATRIC ROLLATOR) MISC 1 Device by Does not apply route daily. 08/11/19   Harold Hedge, MD  montelukast (SINGULAIR) 10 MG tablet Take 1  tablet (10 mg total) by mouth at bedtime. 05/31/20   Azzie Glatter, FNP  nicotine (NICODERM CQ - DOSED IN MG/24 HOURS) 21 mg/24hr patch Place 1 patch (21 mg total) onto the skin daily. 04/08/20   Mannam, Hart Robinsons, MD  pantoprazole (PROTONIX) 40 MG tablet Take 1 tablet (40 mg total) by mouth 2 (two) times daily before a meal. 05/31/20 05/26/21  Azzie Glatter, FNP  sacubitril-valsartan (ENTRESTO) 97-103 MG Take 1 tablet by mouth 2 (two) times daily for 180 doses. 06/03/20 09/01/20  Jettie Booze, MD  spironolactone (ALDACTONE) 25 MG tablet Take 1 tablet (25 mg total)  by mouth daily. 05/31/20   Azzie Glatter, FNP  Tiotropium Bromide-Olodaterol (STIOLTO RESPIMAT) 2.5-2.5 MCG/ACT AERS Inhale 2 puffs into the lungs daily. 05/26/20   Magdalen Spatz, NP  varenicline (CHANTIX) 0.5 MG tablet Take 1 tablet (0.5 mg total) by mouth 2 (two) times daily. 04/08/20   Mannam, Hart Robinsons, MD  Vitamin D, Ergocalciferol, (DRISDOL) 1.25 MG (50000 UNIT) CAPS capsule Take 1 capsule (50,000 Units total) by mouth every 7 (seven) days. 05/29/20   Azzie Glatter, FNP    Allergies    Aspirin  Review of Systems   Review of Systems  All other systems reviewed and are negative.   Physical Exam Updated Vital Signs BP (!) 155/77 (BP Location: Left Arm)   Pulse (!) 103   Temp 98.2 F (36.8 C) (Oral)   Resp 16   SpO2 99%   Physical Exam Vitals and nursing note reviewed.  Constitutional:      General: She is not in acute distress.    Appearance: Normal appearance. She is well-developed. She is not toxic-appearing.  HENT:     Head: Normocephalic and atraumatic.  Eyes:     General: Lids are normal.     Conjunctiva/sclera: Conjunctivae normal.     Pupils: Pupils are equal, round, and reactive to light.  Neck:     Thyroid: No thyroid mass.     Trachea: No tracheal deviation.  Cardiovascular:     Rate and Rhythm: Normal rate and regular rhythm.     Heart sounds: Normal heart sounds. No murmur heard.  No gallop.     Pulmonary:     Effort: Pulmonary effort is normal. No respiratory distress.     Breath sounds: Normal breath sounds. No stridor. No decreased breath sounds, wheezing, rhonchi or rales.  Abdominal:     General: Bowel sounds are normal. There is no distension.     Palpations: Abdomen is soft.     Tenderness: There is no abdominal tenderness. There is no rebound.  Musculoskeletal:        General: No tenderness. Normal range of motion.     Cervical back: Normal range of motion and neck supple.       Back:     Comments: Patient with low back pain with right leg straight raise.  Neurovascular intact at right foot.  Skin:    General: Skin is warm and dry.     Findings: No abrasion or rash.  Neurological:     General: No focal deficit present.     Mental Status: She is alert and oriented to person, place, and time.     GCS: GCS eye subscore is 4. GCS verbal subscore is 5. GCS motor subscore is 6.     Cranial Nerves: No cranial nerve deficit.     Sensory: No sensory deficit.     Comments: Sensation intact in upper as well as lower extremities.  Strength is 5 of 5 in upper as well as lower extremities.  Psychiatric:        Speech: Speech normal.        Behavior: Behavior normal.     ED Results / Procedures / Treatments   Labs (all labs ordered are listed, but only abnormal results are displayed) Labs Reviewed  CBG MONITORING, ED - Abnormal; Notable for the following components:      Result Value   Glucose-Capillary 478 (*)    All other components within normal limits  CBC WITH DIFFERENTIAL/PLATELET  BASIC METABOLIC PANEL  URINALYSIS, ROUTINE W REFLEX MICROSCOPIC  BLOOD GAS, VENOUS    EKG None  Radiology No results found.  Procedures Procedures (including critical care time)  Medications Ordered in ED Medications  sodium chloride 0.9 % bolus 1,000 mL (has no administration in time range)  0.9 %  sodium chloride infusion (has no administration in time range)   methocarbamol (ROBAXIN) injection 1,000 mg (has no administration in time range)  morphine 4 MG/ML injection 6 mg (has no administration in time range)    ED Course  I have reviewed the triage vital signs and the nursing notes.  Pertinent labs & imaging results that were available during my care of the patient were reviewed by me and considered in my medical decision making (see chart for details).    MDM Rules/Calculators/A&P                          Blood sugar elevated here and treated with IV fluids as well as insulin with improvement of her blood sugar.  Head CT negative.  Extremely low suspicion for CVA as patient has no subjective findings of decrease in strength or sensation.  No bowel or bladder dysfunction and therefore do not feel as if needs to have an MRI is her lower back pain has been chronic will discharge patient home and have [facility]. Final Clinical Impression(s) / ED Diagnoses Final diagnoses:  None    Rx / DC Orders ED Discharge Orders    None       Lacretia Leigh, MD 08/29/20 1140

## 2020-08-30 ENCOUNTER — Other Ambulatory Visit: Payer: Self-pay | Admitting: Family Medicine

## 2020-08-30 ENCOUNTER — Ambulatory Visit: Payer: Self-pay | Admitting: Family Medicine

## 2020-08-30 MED ORDER — LOSARTAN POTASSIUM 50 MG PO TABS
50.0000 mg | ORAL_TABLET | Freq: Every day | ORAL | 3 refills | Status: DC
Start: 2020-08-30 — End: 2020-10-20

## 2020-08-30 MED FILL — METHOCARBAMOL 750 MG TABS: 750 | 7 days supply | Qty: 30 | Fill #0

## 2020-08-31 NOTE — Telephone Encounter (Signed)
Please see refill request.

## 2020-09-01 MED FILL — NovoLOG 100 UNIT/ML SOLN: 100 | 16 days supply | Qty: 10 | Fill #1

## 2020-09-01 MED FILL — ENTRESTO 97 MG-103 MG TAB: 97-103 | 30 days supply | Qty: 60 | Fill #1

## 2020-09-06 ENCOUNTER — Other Ambulatory Visit: Payer: Self-pay | Admitting: Family Medicine

## 2020-09-08 DIAGNOSIS — J449 Chronic obstructive pulmonary disease, unspecified: Secondary | ICD-10-CM | POA: Diagnosis not present

## 2020-09-08 DIAGNOSIS — G4733 Obstructive sleep apnea (adult) (pediatric): Secondary | ICD-10-CM | POA: Diagnosis not present

## 2020-10-04 ENCOUNTER — Other Ambulatory Visit: Payer: Self-pay | Admitting: Family Medicine

## 2020-10-04 ENCOUNTER — Other Ambulatory Visit: Payer: Self-pay

## 2020-10-04 ENCOUNTER — Encounter: Payer: Self-pay | Admitting: Family Medicine

## 2020-10-04 ENCOUNTER — Ambulatory Visit (INDEPENDENT_AMBULATORY_CARE_PROVIDER_SITE_OTHER): Payer: Medicare Other | Admitting: Family Medicine

## 2020-10-04 VITALS — BP 141/75 | HR 91 | Temp 97.2°F | Ht 67.0 in | Wt 312.0 lb

## 2020-10-04 DIAGNOSIS — R7309 Other abnormal glucose: Secondary | ICD-10-CM

## 2020-10-04 DIAGNOSIS — R739 Hyperglycemia, unspecified: Secondary | ICD-10-CM

## 2020-10-04 DIAGNOSIS — M79605 Pain in left leg: Secondary | ICD-10-CM

## 2020-10-04 DIAGNOSIS — Z9981 Dependence on supplemental oxygen: Secondary | ICD-10-CM

## 2020-10-04 DIAGNOSIS — L97919 Non-pressure chronic ulcer of unspecified part of right lower leg with unspecified severity: Secondary | ICD-10-CM | POA: Diagnosis not present

## 2020-10-04 DIAGNOSIS — F419 Anxiety disorder, unspecified: Secondary | ICD-10-CM

## 2020-10-04 DIAGNOSIS — E1165 Type 2 diabetes mellitus with hyperglycemia: Secondary | ICD-10-CM | POA: Diagnosis not present

## 2020-10-04 DIAGNOSIS — M79604 Pain in right leg: Secondary | ICD-10-CM

## 2020-10-04 DIAGNOSIS — Z720 Tobacco use: Secondary | ICD-10-CM

## 2020-10-04 DIAGNOSIS — I10 Essential (primary) hypertension: Secondary | ICD-10-CM

## 2020-10-04 DIAGNOSIS — Z794 Long term (current) use of insulin: Secondary | ICD-10-CM | POA: Diagnosis not present

## 2020-10-04 DIAGNOSIS — L97929 Non-pressure chronic ulcer of unspecified part of left lower leg with unspecified severity: Secondary | ICD-10-CM

## 2020-10-04 DIAGNOSIS — M25551 Pain in right hip: Secondary | ICD-10-CM

## 2020-10-04 DIAGNOSIS — R600 Localized edema: Secondary | ICD-10-CM

## 2020-10-04 DIAGNOSIS — Z09 Encounter for follow-up examination after completed treatment for conditions other than malignant neoplasm: Secondary | ICD-10-CM

## 2020-10-04 LAB — POCT URINALYSIS DIPSTICK
Bilirubin, UA: NEGATIVE
Blood, UA: NEGATIVE
Glucose, UA: NEGATIVE
Ketones, UA: NEGATIVE
Nitrite, UA: NEGATIVE
Protein, UA: NEGATIVE
Spec Grav, UA: 1.02 (ref 1.010–1.025)
Urobilinogen, UA: 0.2 E.U./dL
pH, UA: 7 (ref 5.0–8.0)

## 2020-10-04 LAB — POCT GLYCOSYLATED HEMOGLOBIN (HGB A1C)
HbA1c POC (<> result, manual entry): 14.5 % (ref 4.0–5.6)
HbA1c, POC (controlled diabetic range): 14.5 % — AB (ref 0.0–7.0)
HbA1c, POC (prediabetic range): 14.5 % — AB (ref 5.7–6.4)
Hemoglobin A1C: 14.5 % — AB (ref 4.0–5.6)

## 2020-10-04 MED ORDER — INSULIN GLARGINE 100 UNIT/ML ~~LOC~~ SOLN: 60.0000 [IU] | Freq: Every day | SUBCUTANEOUS | 11 refills | Status: DC

## 2020-10-04 MED ORDER — OXYCODONE-ACETAMINOPHEN 5-325 MG PO TABS: 1.0000 | ORAL_TABLET | ORAL | 0 refills | Status: DC | PRN

## 2020-10-04 MED ORDER — INSULIN ASPART 100 UNIT/ML ~~LOC~~ SOLN: 30.0000 [IU] | Freq: Three times a day (TID) | SUBCUTANEOUS | 11 refills | Status: DC

## 2020-10-04 MED ORDER — METFORMIN HCL 1000 MG PO TABS: 1000.0000 mg | ORAL_TABLET | Freq: Two times a day (BID) | ORAL | 3 refills | Status: DC

## 2020-10-04 MED ORDER — GLIPIZIDE 10 MG PO TABS: 10.0000 mg | ORAL_TABLET | Freq: Two times a day (BID) | ORAL | 3 refills | Status: DC

## 2020-10-04 MED FILL — glipiZIDE 10 MG TABS: 10 | 90 days supply | Qty: 180 | Fill #0

## 2020-10-04 MED FILL — LANTUS 100 UNITS/ML VIAL: 100 | 16 days supply | Qty: 10 | Fill #0

## 2020-10-04 MED FILL — METFORMIN HCL 1000 MG TABS: 1000 | 90 days supply | Qty: 180 | Fill #0

## 2020-10-04 MED FILL — INSULIN ASPART 100 UNIT/ML: 100 | 11 days supply | Qty: 10 | Fill #0

## 2020-10-04 NOTE — Progress Notes (Signed)
Patient [facility] Follow Up  Subjective:  Patient ID: Sharon Swanson, female    DOB: 04-Dec-1960  Age: 60 y.o. MRN: [id number]  CC:  Chief Complaint  Patient presents with  . Follow-up    HPI Sharon Swanson is a 60 year old female who presents for Hospital Follow Up today.   Patient Active Problem List   Diagnosis Date Noted  . Bilateral lower extremity edema 02/25/2020  . Shortness of breath on exertion 02/25/2020  . Dependence on continuous supplemental oxygen 02/25/2020  . Abdominal pain, acute, periumbilical 25/00/3704  . Hemoglobin A1C greater than 9%, indicating poor diabetic control 10/19/2019  . Class 3 severe obesity due to excess calories with serious comorbidity and body mass index (BMI) of 45.0 to 49.9 in adult [facility]) 09/21/2019  . Hemoglobin A1C between 7% and 9% indicating borderline diabetic control 09/21/2019  . Chronic respiratory failure ([place]) 08/02/2019  . Diastolic dysfunction 88/89/1694  . Cellulitis 08/02/2019  . Insulin-requiring or dependent type II diabetes mellitus ([place]) 01/19/2017  . Essential hypertension 01/19/2017  . Anxiety 01/19/2017  . Chest pain 01/19/2017  . OSA (obstructive sleep apnea) 01/19/2017  . COPD (chronic obstructive pulmonary disease) ([place])   . Acute respiratory failure ([place]) 08/17/2014  . Umbilical hernia 50/38/8828  . Pulmonary nodule, left 08/17/2014  . Constipation, chronic 08/15/2012  . GERD (gastroesophageal reflux disease) 11/08/2011  . Tobacco abuse 11/08/2011  . Fatty liver 11/08/2011  . BMI 45.0-49.9, adult ([place]) 11/08/2011  . Fibroids 11/08/2011   Current Status: Since her last office visit, has had and ED visit for Low Back Pain on 08/29/2020. Today she is doing well with no complaints. She continues to have chronic bilateral leg and back pain. She does have leg ulcers, which are worsening. She continues to rely on continues oxygen at 3 liters. She uses Rollator  for assistance ambulation. She denies visual changes, chest pain, cough, shortness of breath, heart palpitations, and falls. She has occasional headaches and dizziness with position changes. Denies severe headaches, confusion, seizures, double vision, and blurred vision, nausea and vomiting. Her most recent normal range of preprandial blood glucose levels have been between 313-400. She has seen low range of 300 and high of 480 since his last office visit. She denies fatigue, frequent urination, blurred vision, excessive hunger, excessive thirst, weight gain, weight loss, and poor wound healing. She continues to check her feet regularly. She is currently smoking 2 cigarettes a day. She denies fevers, chills, recent infections, weight loss, and night sweats. Denies GI problems such as diarrhea, and constipation. She has no reports of blood in stools, dysuria and hematuria. She is taking all medications as prescribed.   Past Medical History:  Diagnosis Date  . Acid reflux   . Asthma   . Bilateral lower extremity edema 01/2020  . COPD (chronic obstructive pulmonary disease) ([place])   . CPAP (continuous positive airway pressure) dependence   . Diabetes ([place])   . Diabetes mellitus without complication ([place])    Type II  . Fatty liver 11/08/2011  . Fibroids 11/08/2011  . Hyperglycemia   . Hyperlipidemia 05/2020  . Hypertension   . Microalbuminuria 01/2020  . Obesity (BMI 30-39.9) 11/08/2011  . Osteoarthritis of right acromioclavicular joint   . Pancreatitis 11/08/2011  . Rotator cuff tear, right   . Shortness of breath on exertion 01/2020  . Sleep apnea   . Ventral hernia   . Vitamin D deficiency 05/2020  Past Surgical History:  Procedure Laterality Date  . ORTHOPEDIC SURGERY     R ankle, tendonitis    Family History  Problem Relation Age of Onset  . Hypertension Mother   . Diabetes Other     Social History   Socioeconomic History  . Marital status: Married    Spouse name: Not on file   . Number of children: Not on file  . Years of education: Not on file  . Highest education level: Not on file  Occupational History  . Not on file  Tobacco Use  . Smoking status: Current Every Day Smoker    Packs/day: 0.50    Years: 39.00    Pack years: 19.50    Types: Cigarettes    Start date: 09/25/1980  . Smokeless tobacco: Never Used  . Tobacco comment: pt states she smoke 2 cigerettes a day  Vaping Use  . Vaping Use: Never used  Substance and Sexual Activity  . Alcohol use: No  . Drug use: No  . Sexual activity: Not Currently  Other Topics Concern  . Not on file  Social History Narrative  . Not on file   Social Determinants of Health   Financial Resource Strain: Not on file  Food Insecurity: Not on file  Transportation Needs: Not on file  Physical Activity: Not on file  Stress: Not on file  Social Connections: Not on file  Intimate Partner Violence: Not on file    Outpatient Medications Prior to Visit  Medication Sig Dispense Refill  . albuterol (VENTOLIN HFA) 108 (90 Base) MCG/ACT inhaler Inhale 2 puffs into the lungs every 4 (four) hours as needed for wheezing or shortness of breath. 8 g 12  . atorvastatin (LIPITOR) 20 MG tablet Take 1 tablet (20 mg total) by mouth daily. 90 tablet 3  . Blood Glucose Monitoring Suppl (TRUE METRIX METER) w/Device KIT USE AS DIRECTED 1 kit 0  . carvedilol (COREG) 6.25 MG tablet Take 1 tablet (6.25 mg total) by mouth 2 (two) times daily. 180 tablet 3  . cetirizine (ZYRTEC) 10 MG tablet Take 1 tablet (10 mg total) by mouth in the morning. 90 tablet 3  . docusate sodium (COLACE) 100 MG capsule Take 1 capsule (100 mg total) by mouth 3 (three) times daily as needed. (Patient taking differently: Take 100 mg by mouth 3 (three) times daily as needed for mild constipation.) 30 capsule 3  . ENTRESTO 97-103 MG Take 1 tablet by mouth 2 (two) times daily.    . fluticasone (FLONASE) 50 MCG/ACT nasal spray Place 1 spray into both nostrils daily.  (Patient taking differently: Place 1 spray into both nostrils as needed for allergies or rhinitis.) 16 g 3  . furosemide (LASIX) 40 MG tablet Take 1 tablet (40 mg total) by mouth 2 (two) times daily. 60 tablet 2  . gabapentin (NEURONTIN) 300 MG capsule TAKE 1 CAPSULE (300 MG TOTAL) BY MOUTH 3 (THREE) TIMES DAILY. 90 capsule 3  . glucose monitoring kit (FREESTYLE) monitoring kit 1 each by Does not apply route as needed for other. 1 each 0  . ipratropium-albuterol (DUONEB) 0.5-2.5 (3) MG/3ML SOLN Take 3 mLs by nebulization every 6 (six) hours as needed (shortness of breath, wheeze). 360 mL 12  . losartan (COZAAR) 50 MG tablet Take 1 tablet (50 mg total) by mouth daily. 90 tablet 3  . methocarbamol (ROBAXIN) 750 MG tablet Take 1 tablet (750 mg total) by mouth 3 (three) times daily. (Patient taking differently: Take 750 mg by  mouth 3 (three) times daily as needed for muscle spasms.) 90 tablet 3  . Misc. Devices (BARIATRIC ROLLATOR) MISC 1 Device by Does not apply route daily. 1 each 0  . montelukast (SINGULAIR) 10 MG tablet Take 1 tablet (10 mg total) by mouth at bedtime. 90 tablet 3  . nicotine (NICODERM CQ - DOSED IN MG/24 HOURS) 21 mg/24hr patch Place 1 patch (21 mg total) onto the skin daily. 28 patch 0  . pantoprazole (PROTONIX) 40 MG tablet TAKE 1 TABLET TWICE DAILY  BEFORE  A  MEAL 180 tablet 3  . spironolactone (ALDACTONE) 25 MG tablet Take 1 tablet (25 mg total) by mouth daily. 90 tablet 3  . Tiotropium Bromide-Olodaterol (STIOLTO RESPIMAT) 2.5-2.5 MCG/ACT AERS Inhale 2 puffs into the lungs daily. 4 g 0  . insulin aspart (NOVOLOG) 100 UNIT/ML injection Inject 20 Units into the skin 3 (three) times daily before meals. 10 mL 11  . insulin glargine (LANTUS SOLOSTAR) 100 UNIT/ML Solostar Pen Inject 50 Units into the skin at bedtime. 15 mL PRN  . varenicline (CHANTIX) 0.5 MG tablet Take 1 tablet (0.5 mg total) by mouth 2 (two) times daily. (Patient not taking: Reported on 10/04/2020) 60 tablet 3  .  Vitamin D, Ergocalciferol, (DRISDOL) 1.25 MG (50000 UNIT) CAPS capsule Take 1 capsule (50,000 Units total) by mouth every 7 (seven) days. (Patient not taking: Reported on 10/04/2020) 5 capsule 3  . methocarbamol (ROBAXIN-750) 750 MG tablet Take 1 tablet (750 mg total) by mouth 4 (four) times daily. (Patient not taking: Reported on 10/04/2020) 30 tablet 0  . oxyCODONE-acetaminophen (PERCOCET/ROXICET) 5-325 MG tablet Take 1-2 tablets by mouth every 4 (four) hours as needed for severe pain. (Patient not taking: Reported on 10/04/2020) 15 tablet 0   No facility-administered medications prior to visit.    Allergies  Allergen Reactions  . Aspirin Other (See Comments)    Abdominal pain    ROS Review of Systems  Constitutional: Negative.   HENT: Negative.   Eyes: Negative.   Respiratory: Negative.   Cardiovascular: Negative.   Gastrointestinal: Negative.   Endocrine: Negative.   Genitourinary: Negative.   Musculoskeletal: Positive for arthralgias (generalized joint pain).  Skin: Negative.   Allergic/Immunologic: Negative.   Neurological: Positive for dizziness (occasional ) and headaches (occasional ).  Hematological: Negative.   Psychiatric/Behavioral: Negative.       Objective:    Physical Exam Vitals and nursing note reviewed.  Constitutional:      Appearance: Normal appearance.  HENT:     Head: Normocephalic and atraumatic.     Nose: Nose normal.     Mouth/Throat:     Mouth: Mucous membranes are moist.     Pharynx: Oropharynx is clear.  Cardiovascular:     Rate and Rhythm: Normal rate and regular rhythm.     Pulses: Normal pulses.     Heart sounds: Normal heart sounds.  Pulmonary:     Effort: Pulmonary effort is normal.     Breath sounds: Wheezing (bilateral lower extremity) and rhonchi (bilateral lower extremity) present.  Abdominal:     General: Bowel sounds are normal.     Palpations: Abdomen is soft.  Musculoskeletal:        General: Normal range of motion.      Cervical back: Normal range of motion and neck supple.  Skin:    General: Skin is warm and dry.  Neurological:     General: No focal deficit present.     Mental Status: She is alert  and oriented to person, place, and time.  Psychiatric:        Mood and Affect: Mood normal.        Behavior: Behavior normal.        Thought Content: Thought content normal.        Judgment: Judgment normal.     BP (!) 141/75 (BP Location: Right Arm, Patient Position: Sitting, Cuff Size: Large)   Pulse 91   Temp (!) 97.2 F (36.2 C) (Temporal)   Ht 5' 7"  (1.702 m)   Wt (!) 312 lb (141.5 kg)   SpO2 98%   BMI 48.87 kg/m  Wt Readings from Last 3 Encounters:  10/04/20 (!) 312 lb (141.5 kg)  06/03/20 (!) 316 lb 6.4 oz (143.5 kg)  05/28/20 (!) 311 lb 12.8 oz (141.4 kg)     Health Maintenance Due  Topic Date Due  . Hepatitis C Screening  Never done  . FOOT EXAM  Never done  . OPHTHALMOLOGY EXAM  Never done  . TETANUS/TDAP  Never done  . PAP SMEAR-Modifier  Never done  . MAMMOGRAM  Never done  . COVID-19 Vaccine (3 - Booster for Moderna series) 07/14/2020    There are no preventive care reminders to display for this patient.  Lab Results  Component Value Date   TSH 1.060 10/04/2020   Lab Results  Component Value Date   WBC 4.5 10/04/2020   HGB 14.6 10/04/2020   HCT 43.5 10/04/2020   MCV 86 10/04/2020   PLT 195 10/04/2020   Lab Results  Component Value Date   NA 139 10/04/2020   K 3.6 10/04/2020   CO2 29 10/04/2020   GLUCOSE 284 (H) 10/04/2020   BUN 10 10/04/2020   CREATININE 0.67 10/04/2020   BILITOT 0.5 10/04/2020   ALKPHOS 89 10/04/2020   AST 15 10/04/2020   ALT 18 10/04/2020   PROT 6.9 10/04/2020   ALBUMIN 4.1 10/04/2020   CALCIUM 9.5 10/04/2020   ANIONGAP 9 08/29/2020   GFR 128.72 03/01/2020   Lab Results  Component Value Date   CHOL 171 10/04/2020   Lab Results  Component Value Date   HDL 60 10/04/2020   Lab Results  Component Value Date   LDLCALC 90  10/04/2020   Lab Results  Component Value Date   TRIG 121 10/04/2020   Lab Results  Component Value Date   CHOLHDL 2.9 10/04/2020   Lab Results  Component Value Date   HGBA1C 14.5 (A) 10/04/2020   HGBA1C 14.5 10/04/2020   HGBA1C 14.5 (A) 10/04/2020   HGBA1C 14.5 (A) 10/04/2020      Assessment & Plan:   1. Type 2 diabetes mellitus with hyperglycemia, with long-term current use of insulin (HCC) She will continue medication as prescribed, to decrease foods/beverages high in sugars and carbs and follow Heart Healthy or DASH diet. Increase physical activity to at least 30 minutes cardio exercise daily.  - HgB A1c - POCT Urinalysis Dipstick - Ambulatory referral to Vascular Surgery - insulin aspart (NOVOLOG) 100 UNIT/ML injection; Inject 30 Units into the skin 3 (three) times daily before meals.  Dispense: 10 mL; Refill: 11 - insulin glargine (LANTUS) 100 UNIT/ML injection; Inject 0.6 mLs (60 Units total) into the skin daily.  Dispense: 10 mL; Refill: 11 - CBC with Differential - Comprehensive metabolic panel - TSH - Lipid Panel - Vitamin B12 - Vitamin D, 25-hydroxy  2. Hemoglobin A1C greater than 9%, indicating poor diabetic control Hgb A1c increased at 14.5 today. We will increase  insulin dosage today.  - insulin aspart (NOVOLOG) 100 UNIT/ML injection; Inject 30 Units into the skin 3 (three) times daily before meals.  Dispense: 10 mL; Refill: 11 - insulin glargine (LANTUS) 100 UNIT/ML injection; Inject 0.6 mLs (60 Units total) into the skin daily.  Dispense: 10 mL; Refill: 11  3. Hyperglycemia - insulin aspart (NOVOLOG) 100 UNIT/ML injection; Inject 30 Units into the skin 3 (three) times daily before meals.  Dispense: 10 mL; Refill: 11 - insulin glargine (LANTUS) 100 UNIT/ML injection; Inject 0.6 mLs (60 Units total) into the skin daily.  Dispense: 10 mL; Refill: 11  4. Ulcers of both lower legs ([place]) - Ambulatory referral to Vascular Surgery  5. Bilateral lower extremity  pain Rx sent by ED physicians 08/2020 to [facility], which does not fill narcotics. Patient never had Rx filled. We will resend to to another pharmacy today.  - oxyCODONE-acetaminophen (PERCOCET/ROXICET) 5-325 MG tablet; Take 1-2 tablets by mouth every 4 (four) hours as needed for severe pain.  Dispense: 15 tablet; Refill: 0 - Ambulatory referral to Vascular Surgery  6. Essential hypertension The current medical regimen is effective; blood pressure is stable at 141/75 today; continue present plan and medications as prescribed. She will continue to take medications as prescribed, to decrease high sodium intake, excessive alcohol intake, increase potassium intake, smoking cessation, and increase physical activity of at least 30 minutes of cardio activity daily. She will continue to follow Heart Healthy or DASH diet.  7. Bilateral - Ambulatory referral to Vascular Surgery  8. Tobacco use  9. Right hip pain - oxyCODONE-acetaminophen (PERCOCET/ROXICET) 5-325 MG tablet; Take 1-2 tablets by mouth every 4 (four) hours as needed for severe pain.  Dispense: 15 tablet; Refill: 0  10. Dependence on continuous supplemental oxygen Continue oxygen use at 3 liters as prescribed.   11. Anxiety  12. Follow up She will follow up in 1 month.   Meds ordered this encounter  Medications  . oxyCODONE-acetaminophen (PERCOCET/ROXICET) 5-325 MG tablet    Sig: Take 1-2 tablets by mouth every 4 (four) hours as needed for severe pain.    Dispense:  15 tablet    Refill:  0    Order Specific Question:   Supervising Provider    Answer:   Tresa Garter [id number]  . insulin aspart (NOVOLOG) 100 UNIT/ML injection    Sig: Inject 30 Units into the skin 3 (three) times daily before meals.    Dispense:  10 mL    Refill:  11    Dose increase.  . insulin glargine (LANTUS) 100 UNIT/ML injection    Sig: Inject 0.6 mLs (60 Units total) into the skin daily.    Dispense:  10 mL    Refill:   11    Dose increased.  . metFORMIN (GLUCOPHAGE) 1000 MG tablet    Sig: Take 1 tablet (1,000 mg total) by mouth 2 (two) times daily with a meal.    Dispense:  180 tablet    Refill:  3  . glipiZIDE (GLUCOTROL) 10 MG tablet    Sig: Take 1 tablet (10 mg total) by mouth 2 (two) times daily before a meal.    Dispense:  180 tablet    Refill:  3    Orders Placed This Encounter  Procedures  . CBC with Differential  . Comprehensive metabolic panel  . TSH  . Lipid Panel  . Vitamin B12  . Vitamin D, 25-hydroxy  . Ambulatory referral to Vascular Surgery  .  HgB A1c  . POCT Urinalysis Dipstick     Referral Orders     Ambulatory referral to Vascular Surgery   Kathe Becton, MSN, ANE, FNP-BC [facility] [place], [place] [address] [phone] fax     Problem List Items Addressed This Visit      Cardiovascular and Mediastinum   Essential hypertension   Relevant Medications   ENTRESTO 97-103 MG     Endocrine   Hemoglobin A1C greater than 9%, indicating poor diabetic control   Relevant Medications   insulin aspart (NOVOLOG) 100 UNIT/ML injection   insulin glargine (LANTUS) 100 UNIT/ML injection   metFORMIN (GLUCOPHAGE) 1000 MG tablet   glipiZIDE (GLUCOTROL) 10 MG tablet     Other   Anxiety   Bilateral lower extremity edema   Relevant Orders   Ambulatory referral to Vascular Surgery   Dependence on continuous supplemental oxygen    Other Visit Diagnoses    Type 2 diabetes mellitus with hyperglycemia, with long-term current use of insulin (HCC)    -  Primary   Relevant Medications   insulin aspart (NOVOLOG) 100 UNIT/ML injection   insulin glargine (LANTUS) 100 UNIT/ML injection   metFORMIN (GLUCOPHAGE) 1000 MG tablet   glipiZIDE (GLUCOTROL) 10 MG tablet   Other Relevant Orders   HgB A1c (Completed)   POCT Urinalysis Dipstick (Completed)   Ambulatory  referral to Vascular Surgery   CBC with Differential (Completed)   Comprehensive metabolic panel (Completed)   TSH (Completed)   Lipid Panel (Completed)   Vitamin B12 (Completed)   Vitamin D, 25-hydroxy (Completed)   Hyperglycemia       Relevant Medications   insulin aspart (NOVOLOG) 100 UNIT/ML injection   insulin glargine (LANTUS) 100 UNIT/ML injection   Ulcers of both lower legs (HCC)       Relevant Orders   Ambulatory referral to Vascular Surgery   Bilateral lower extremity pain       Relevant Medications   oxyCODONE-acetaminophen (PERCOCET/ROXICET) 5-325 MG tablet   Other Relevant Orders   Ambulatory referral to Vascular Surgery   Tobacco use       Right hip pain       Relevant Medications   oxyCODONE-acetaminophen (PERCOCET/ROXICET) 5-325 MG tablet   Follow up          Meds ordered this encounter  Medications  . oxyCODONE-acetaminophen (PERCOCET/ROXICET) 5-325 MG tablet    Sig: Take 1-2 tablets by mouth every 4 (four) hours as needed for severe pain.    Dispense:  15 tablet    Refill:  0    Order Specific Question:   Supervising Provider    Answer:   Tresa Garter [id number]  . insulin aspart (NOVOLOG) 100 UNIT/ML injection    Sig: Inject 30 Units into the skin 3 (three) times daily before meals.    Dispense:  10 mL    Refill:  11    Dose increase.  . insulin glargine (LANTUS) 100 UNIT/ML injection    Sig: Inject 0.6 mLs (60 Units total) into the skin daily.    Dispense:  10 mL    Refill:  11    Dose increased.  . metFORMIN (GLUCOPHAGE) 1000 MG tablet    Sig: Take 1 tablet (1,000 mg total) by mouth 2 (two) times daily with a meal.    Dispense:  180 tablet    Refill:  3  . glipiZIDE (GLUCOTROL) 10 MG tablet  Sig: Take 1 tablet (10 mg total) by mouth 2 (two) times daily before a meal.    Dispense:  180 tablet    Refill:  3    Follow-up: No follow-ups on file.    Azzie Glatter, FNP

## 2020-10-05 LAB — LIPID PANEL
Chol/HDL Ratio: 2.9 ratio (ref 0.0–4.4)
Cholesterol, Total: 171 mg/dL (ref 100–199)
HDL: 60 mg/dL (ref >39)
LDL Chol Calc (NIH): 90 mg/dL (ref 0–99)
Triglycerides: 121 mg/dL (ref 0–149)
VLDL Cholesterol Cal: 21 mg/dL (ref 5–40)

## 2020-10-05 LAB — CBC WITH DIFFERENTIAL/PLATELET
Basophils Absolute: 0 10*3/uL (ref 0.0–0.2)
Basos: 1 %
EOS (ABSOLUTE): 0.1 10*3/uL (ref 0.0–0.4)
Eos: 3 %
Hematocrit: 43.5 % (ref 34.0–46.6)
Hemoglobin: 14.6 g/dL (ref 11.1–15.9)
Immature Grans (Abs): 0 10*3/uL (ref 0.0–0.1)
Immature Granulocytes: 0 %
Lymphocytes Absolute: 1 10*3/uL (ref 0.7–3.1)
Lymphs: 22 %
MCH: 28.7 pg (ref 26.6–33.0)
MCHC: 33.6 g/dL (ref 31.5–35.7)
MCV: 86 fL (ref 79–97)
Monocytes Absolute: 0.5 10*3/uL (ref 0.1–0.9)
Monocytes: 10 %
Neutrophils Absolute: 2.9 10*3/uL (ref 1.4–7.0)
Neutrophils: 64 %
Platelets: 195 10*3/uL (ref 150–450)
RBC: 5.08 x10E6/uL (ref 3.77–5.28)
RDW: 12.2 % (ref 11.7–15.4)
WBC: 4.5 10*3/uL (ref 3.4–10.8)

## 2020-10-05 LAB — COMPREHENSIVE METABOLIC PANEL
ALT: 18 IU/L (ref 0–32)
AST: 15 IU/L (ref 0–40)
Albumin/Globulin Ratio: 1.5 (ref 1.2–2.2)
Albumin: 4.1 g/dL (ref 3.8–4.9)
Alkaline Phosphatase: 89 IU/L (ref 44–121)
BUN/Creatinine Ratio: 15 (ref 9–23)
BUN: 10 mg/dL (ref 6–24)
Bilirubin Total: 0.5 mg/dL (ref 0.0–1.2)
CO2: 29 mmol/L (ref 20–29)
Calcium: 9.5 mg/dL (ref 8.7–10.2)
Chloride: 94 mmol/L — ABNORMAL LOW (ref 96–106)
Creatinine, Ser: 0.67 mg/dL (ref 0.57–1.00)
GFR calc Af Amer: 111 mL/min/{1.73_m2} (ref >59)
GFR calc non Af Amer: 97 mL/min/{1.73_m2} (ref >59)
Globulin, Total: 2.8 g/dL (ref 1.5–4.5)
Glucose: 284 mg/dL — ABNORMAL HIGH (ref 65–99)
Potassium: 3.6 mmol/L (ref 3.5–5.2)
Sodium: 139 mmol/L (ref 134–144)
Total Protein: 6.9 g/dL (ref 6.0–8.5)

## 2020-10-05 LAB — TSH: TSH: 1.06 u[IU]/mL (ref 0.450–4.500)

## 2020-10-05 LAB — VITAMIN D 25 HYDROXY (VIT D DEFICIENCY, FRACTURES): Vit D, 25-Hydroxy: 11.4 ng/mL — ABNORMAL LOW (ref 30.0–100.0)

## 2020-10-05 LAB — VITAMIN B12: Vitamin B-12: 565 pg/mL (ref 232–1245)

## 2020-10-07 ENCOUNTER — Encounter: Payer: Self-pay | Admitting: Family Medicine

## 2020-10-10 ENCOUNTER — Other Ambulatory Visit: Payer: Self-pay | Admitting: Family Medicine

## 2020-10-10 DIAGNOSIS — E559 Vitamin D deficiency, unspecified: Secondary | ICD-10-CM

## 2020-10-10 MED ORDER — VITAMIN D (ERGOCALCIFEROL) 1.25 MG (50000 UNIT) PO CAPS: 50000.0000 [IU] | ORAL_CAPSULE | ORAL | 6 refills | Status: DC

## 2020-10-11 MED FILL — VIT D2 1.25 MG (50,000 UNIT: 1.25 MG | 35 days supply | Qty: 5 | Fill #0

## 2020-10-13 ENCOUNTER — Other Ambulatory Visit: Payer: Self-pay | Admitting: Family Medicine

## 2020-10-13 DIAGNOSIS — Z1231 Encounter for screening mammogram for malignant neoplasm of breast: Secondary | ICD-10-CM

## 2020-10-15 ENCOUNTER — Telehealth (INDEPENDENT_AMBULATORY_CARE_PROVIDER_SITE_OTHER): Payer: Medicare Other | Admitting: Family Medicine

## 2020-10-15 DIAGNOSIS — I10 Essential (primary) hypertension: Secondary | ICD-10-CM

## 2020-10-15 DIAGNOSIS — Z09 Encounter for follow-up examination after completed treatment for conditions other than malignant neoplasm: Secondary | ICD-10-CM

## 2020-10-15 DIAGNOSIS — R7309 Other abnormal glucose: Secondary | ICD-10-CM | POA: Diagnosis not present

## 2020-10-15 DIAGNOSIS — L97919 Non-pressure chronic ulcer of unspecified part of right lower leg with unspecified severity: Secondary | ICD-10-CM

## 2020-10-15 DIAGNOSIS — R739 Hyperglycemia, unspecified: Secondary | ICD-10-CM | POA: Diagnosis not present

## 2020-10-15 DIAGNOSIS — L97929 Non-pressure chronic ulcer of unspecified part of left lower leg with unspecified severity: Secondary | ICD-10-CM

## 2020-10-15 DIAGNOSIS — Z794 Long term (current) use of insulin: Secondary | ICD-10-CM

## 2020-10-15 DIAGNOSIS — E1165 Type 2 diabetes mellitus with hyperglycemia: Secondary | ICD-10-CM

## 2020-10-19 ENCOUNTER — Other Ambulatory Visit: Payer: Self-pay | Admitting: Family Medicine

## 2020-10-19 ENCOUNTER — Encounter: Payer: Self-pay | Admitting: Family Medicine

## 2020-10-19 DIAGNOSIS — R739 Hyperglycemia, unspecified: Secondary | ICD-10-CM

## 2020-10-19 DIAGNOSIS — R7309 Other abnormal glucose: Secondary | ICD-10-CM

## 2020-10-19 DIAGNOSIS — E1165 Type 2 diabetes mellitus with hyperglycemia: Secondary | ICD-10-CM

## 2020-10-19 DIAGNOSIS — Z794 Long term (current) use of insulin: Secondary | ICD-10-CM

## 2020-10-19 NOTE — Progress Notes (Signed)
Virtual Visit via Telephone Note  I connected with Sharon Swanson on 10/19/20 at  9:20 AM EST by telephone and verified that I am speaking with the correct person using two identifiers.  Location: Patient: Home Provider: Office   I discussed the limitations, risks, security and privacy concerns of performing an evaluation and management service by telephone and the availability of in person appointments. I also discussed with the patient that there may be a patient responsible charge related to this service. The patient expressed understanding and agreed to proceed.   History of Present Illness:  Patient Active Problem List   Diagnosis Date Noted  . Bilateral lower extremity edema 02/25/2020  . Shortness of breath on exertion 02/25/2020  . Dependence on continuous supplemental oxygen 02/25/2020  . Abdominal pain, acute, periumbilical 25/01/3975  . Hemoglobin A1C greater than 9%, indicating poor diabetic control 10/19/2019  . Class 3 severe obesity due to excess calories with serious comorbidity and body mass index (BMI) of 45.0 to 49.9 in adult [facility]) 09/21/2019  . Hemoglobin A1C between 7% and 9% indicating borderline diabetic control 09/21/2019  . Chronic respiratory failure ([place]) 08/02/2019  . Diastolic dysfunction 73/41/9379  . Cellulitis 08/02/2019  . Insulin-requiring or dependent type II diabetes mellitus ([place]) 01/19/2017  . Essential hypertension 01/19/2017  . Anxiety 01/19/2017  . Chest pain 01/19/2017  . OSA (obstructive sleep apnea) 01/19/2017  . COPD (chronic obstructive pulmonary disease) ([place])   . Acute respiratory failure ([place]) 08/17/2014  . Umbilical hernia 02/40/9735  . Pulmonary nodule, left 08/17/2014  . Constipation, chronic 08/15/2012  . GERD (gastroesophageal reflux disease) 11/08/2011  . Tobacco abuse 11/08/2011  . Fatty liver 11/08/2011  . BMI 45.0-49.9, adult ([place]) 11/08/2011  . Fibroids 11/08/2011    Current Status: Since her last office visit, her  range of preprandial blood glucose levels continue to be > 300. She states that she is taking supportive medications as prescribed and decreasing her carbohydrates and sugars in her diet. She denies fatigue, frequent urination, blurred vision, excessive hunger, excessive thirst, weight gain, weight loss, and poor wound healing. She continues to check her feet regularly. She denies fevers, chills, fatigue, recent infections, weight loss, and night sweats. She has not had any headaches, dizziness, and falls. No chest pain, heart palpitations, cough and shortness of breath reported. Denies GI problems such as nausea, vomiting, diarrhea, and constipation. She has no reports of blood in stools, dysuria and hematuria. No depression or anxiety, and denies suicidal ideations, homicidal ideations, or auditory hallucinations. She is taking all medications as prescribed. She denies pain today.     Observations/Objective:  Telephone Virtual Visit   Assessment and Plan:  1. Type 2 diabetes mellitus with hyperglycemia, with long-term current use of insulin (HCC) We will refer her to Endocrinology today. She will continue medication as prescribed, to decrease foods/beverages high in sugars and carbs and follow Heart Healthy or DASH diet. Increase physical activity to at least 30 minutes cardio exercise daily.   2. Hemoglobin A1C greater than 9%, indicating poor diabetic control  3. Hyperglycemia  4. Ulcers of both lower legs ([place])  5. Essential hypertension She will continue to take medications as prescribed, to decrease high sodium intake, excessive alcohol intake, increase potassium intake, smoking cessation, and increase physical activity of at least 30 minutes of cardio activity daily. She will continue to follow Heart Healthy or DASH diet.  6. Follow up She will keep follow up appointment.   No orders of the defined types were  placed in this encounter.   No orders of the defined types were placed  in this encounter.   Referral Orders  No referral(s) requested today    Kathe Becton, MSN, ANE, FNP-BC [facility] [place] [address] [place], [name] [address] [phone]- fax     I discussed the assessment and treatment plan with the patient. The patient was provided an opportunity to ask questions and all were answered. The patient agreed with the plan and demonstrated an understanding of the instructions.   The patient was advised to call back or seek an in-person evaluation if the symptoms worsen or if the condition fails to improve as anticipated.  I provided 20 minutes of non-face-to-face time during this encounter.   Sharon Glatter, FNP

## 2020-10-20 ENCOUNTER — Other Ambulatory Visit: Payer: Self-pay | Admitting: Family Medicine

## 2020-10-20 DIAGNOSIS — R7309 Other abnormal glucose: Secondary | ICD-10-CM

## 2020-10-20 DIAGNOSIS — R0602 Shortness of breath: Secondary | ICD-10-CM

## 2020-10-20 DIAGNOSIS — E1165 Type 2 diabetes mellitus with hyperglycemia: Secondary | ICD-10-CM

## 2020-10-20 DIAGNOSIS — E785 Hyperlipidemia, unspecified: Secondary | ICD-10-CM

## 2020-10-20 DIAGNOSIS — J302 Other seasonal allergic rhinitis: Secondary | ICD-10-CM

## 2020-10-20 DIAGNOSIS — R600 Localized edema: Secondary | ICD-10-CM

## 2020-10-20 DIAGNOSIS — R739 Hyperglycemia, unspecified: Secondary | ICD-10-CM

## 2020-10-20 MED ORDER — LOSARTAN POTASSIUM 50 MG PO TABS: 50.0000 mg | ORAL_TABLET | Freq: Every day | ORAL | 3 refills | Status: DC

## 2020-10-20 MED ORDER — FLUTICASONE PROPIONATE 50 MCG/ACT NA SUSP: 1.0000 | NASAL | 3 refills | Status: DC | PRN

## 2020-10-20 MED ORDER — FUROSEMIDE 40 MG PO TABS: 40.0000 mg | ORAL_TABLET | Freq: Two times a day (BID) | ORAL | 3 refills | Status: DC

## 2020-10-20 MED ORDER — ATORVASTATIN CALCIUM 20 MG PO TABS: 20.0000 mg | ORAL_TABLET | Freq: Every day | ORAL | 3 refills | Status: DC

## 2020-10-20 MED ORDER — PANTOPRAZOLE SODIUM 40 MG PO TBEC: 40.0000 mg | DELAYED_RELEASE_TABLET | Freq: Every day | ORAL | 3 refills | Status: DC

## 2020-10-20 MED ORDER — GLIPIZIDE 10 MG PO TABS: 10.0000 mg | ORAL_TABLET | Freq: Two times a day (BID) | ORAL | 3 refills | Status: DC

## 2020-10-20 MED ORDER — DOCUSATE SODIUM 100 MG PO CAPS
100.0000 mg | ORAL_CAPSULE | Freq: Three times a day (TID) | ORAL | 3 refills | Status: DC | PRN
Start: 2020-10-20 — End: 2021-03-21

## 2020-10-20 MED ORDER — ALBUTEROL SULFATE HFA 108 (90 BASE) MCG/ACT IN AERS: 2.0000 | INHALATION_SPRAY | RESPIRATORY_TRACT | 3 refills | Status: DC | PRN

## 2020-10-20 MED ORDER — METFORMIN HCL 1000 MG PO TABS: 1000.0000 mg | ORAL_TABLET | Freq: Two times a day (BID) | ORAL | 3 refills | Status: DC

## 2020-10-20 MED ORDER — METHOCARBAMOL 750 MG PO TABS: 750.0000 mg | ORAL_TABLET | Freq: Three times a day (TID) | ORAL | 3 refills | Status: DC | PRN

## 2020-10-20 MED ORDER — SPIRONOLACTONE 25 MG PO TABS: 25.0000 mg | ORAL_TABLET | Freq: Every day | ORAL | 3 refills | Status: DC

## 2020-10-20 MED ORDER — CETIRIZINE HCL 10 MG PO TABS: 10.0000 mg | ORAL_TABLET | Freq: Every morning | ORAL | 3 refills | Status: DC

## 2020-10-20 MED ORDER — STIOLTO RESPIMAT 2.5-2.5 MCG/ACT IN AERS: 2.0000 | INHALATION_SPRAY | Freq: Every day | RESPIRATORY_TRACT | 3 refills | Status: DC

## 2020-10-20 MED ORDER — CARVEDILOL 6.25 MG PO TABS
6.2500 mg | ORAL_TABLET | Freq: Two times a day (BID) | ORAL | 3 refills | Status: DC
Start: 2020-10-20 — End: 2021-02-02

## 2020-10-20 MED ORDER — INSULIN GLARGINE 100 UNIT/ML ~~LOC~~ SOLN: 60.0000 [IU] | Freq: Every day | SUBCUTANEOUS | 3 refills | Status: DC

## 2020-10-20 MED ORDER — GABAPENTIN 300 MG PO CAPS
300.0000 mg | ORAL_CAPSULE | Freq: Three times a day (TID) | ORAL | 3 refills | Status: DC
Start: 2020-10-20 — End: 2021-03-21

## 2020-10-20 MED ORDER — INSULIN ASPART 100 UNIT/ML ~~LOC~~ SOLN: 30.0000 [IU] | Freq: Three times a day (TID) | SUBCUTANEOUS | 3 refills | Status: DC

## 2020-10-20 MED ORDER — MONTELUKAST SODIUM 10 MG PO TABS: 10.0000 mg | ORAL_TABLET | Freq: Every day | ORAL | 3 refills | Status: DC

## 2020-10-20 MED ORDER — IPRATROPIUM-ALBUTEROL 0.5-2.5 (3) MG/3ML IN SOLN: 3.0000 mL | Freq: Four times a day (QID) | RESPIRATORY_TRACT | 12 refills | Status: DC | PRN

## 2020-10-22 ENCOUNTER — Ambulatory Visit: Payer: Medicare Other

## 2020-10-22 ENCOUNTER — Ambulatory Visit (INDEPENDENT_AMBULATORY_CARE_PROVIDER_SITE_OTHER): Payer: Medicare Other | Admitting: Family Medicine

## 2020-10-22 ENCOUNTER — Other Ambulatory Visit: Payer: Self-pay

## 2020-10-22 ENCOUNTER — Encounter: Payer: Self-pay | Admitting: Family Medicine

## 2020-10-22 VITALS — BP 173/83 | HR 101 | Temp 98.1°F | Wt 309.0 lb

## 2020-10-22 DIAGNOSIS — J302 Other seasonal allergic rhinitis: Secondary | ICD-10-CM | POA: Diagnosis not present

## 2020-10-22 DIAGNOSIS — Z01419 Encounter for gynecological examination (general) (routine) without abnormal findings: Secondary | ICD-10-CM

## 2020-10-22 DIAGNOSIS — E1165 Type 2 diabetes mellitus with hyperglycemia: Secondary | ICD-10-CM | POA: Diagnosis not present

## 2020-10-22 DIAGNOSIS — Z09 Encounter for follow-up examination after completed treatment for conditions other than malignant neoplasm: Secondary | ICD-10-CM

## 2020-10-22 DIAGNOSIS — Z794 Long term (current) use of insulin: Secondary | ICD-10-CM

## 2020-10-22 MED ORDER — FLUTICASONE PROPIONATE 50 MCG/ACT NA SUSP
1.0000 | Freq: Every day | NASAL | 11 refills | Status: DC
Start: 2020-10-22 — End: 2021-03-21

## 2020-10-22 NOTE — Progress Notes (Signed)
Patient [facility]   Pap Smear  Subjective:  Patient ID: Sharon Sharon, female    DOB: [birth year]  Age: 60 y.o. MRN: [id number]  CC:  Chief Complaint  Patient presents with  . Gynecologic Exam  . Annual Exam    HPI Sharon Sharon is a 60 year old who presents for her Pap Smear today.  Gynecological Exam  Patient here for routine gynecological exam. She has previously had many Pap smears.  Patient states that she has not had a history of an abnormal Pap smear. She denies abnormal vaginal discharge, vaginal  itching, vaginal burning, or dyspareunia. Patient states that she does not perform monthly self breast exams. She does have any family history of cancer. She typically does not follow a balanced diet and does not exercise routinely.   Past Medical History:  Diagnosis Date  . Acid reflux   . Asthma   . Bilateral lower extremity edema 01/2020  . COPD (chronic obstructive pulmonary disease) ([place])   . CPAP (continuous positive airway pressure) dependence   . Diabetes ([place])   . Diabetes mellitus without complication ([place])    Type II  . Fatty liver 11/08/2011  . Fibroids 11/08/2011  . Hyperglycemia   . Hyperlipidemia 05/2020  . Hypertension   . Microalbuminuria 01/2020  . Obesity (BMI 30-39.9) 11/08/2011  . Osteoarthritis of right acromioclavicular joint   . Pancreatitis 11/08/2011  . Rotator cuff tear, right   . Shortness of breath on exertion 01/2020  . Sleep apnea   . Ventral hernia   . Vitamin D deficiency 05/2020    Past Surgical History:  Procedure Laterality Date  . ORTHOPEDIC SURGERY     R ankle, tendonitis    Family History  Problem Relation Age of Onset  . Hypertension Mother   . Diabetes Other     Social History   Socioeconomic History  . Marital status: Married    Spouse name: Not on file  . Number of children: Not on file  . Years of education: Not on file  . Highest education level: Not on file   Occupational History  . Not on file  Tobacco Use  . Smoking status: Current Every Day Smoker    Packs/day: 0.50    Years: 39.00    Pack years: 19.50    Types: Cigarettes    Start date: 09/25/1980  . Smokeless tobacco: Never Used  . Tobacco comment: pt states she smoke 2 cigerettes a day  Vaping Use  . Vaping Use: Never used  Substance and Sexual Activity  . Alcohol use: No  . Drug use: No  . Sexual activity: Not Currently  Other Topics Concern  . Not on file  Social History Narrative  . Not on file   Social Determinants of Health   Financial Resource Strain: Not on file  Food Insecurity: Not on file  Transportation Needs: Not on file  Physical Activity: Not on file  Stress: Not on file  Social Connections: Not on file  Intimate Partner Violence: Not on file    Outpatient Medications Prior to Visit  Medication Sig Dispense Refill  . albuterol (VENTOLIN HFA) 108 (90 Base) MCG/ACT inhaler Inhale 2 puffs into the lungs every 4 (four) hours as needed for wheezing or shortness of breath. 24 g 3  . atorvastatin (LIPITOR) 20 MG tablet Take 1 tablet (20 mg total) by mouth daily. 90 tablet 3  . Blood Glucose Monitoring Suppl (TRUE METRIX METER) w/Device  KIT USE AS DIRECTED 1 kit 0  . carvedilol (COREG) 6.25 MG tablet Take 1 tablet (6.25 mg total) by mouth 2 (two) times daily. 180 tablet 3  . cetirizine (ZYRTEC) 10 MG tablet Take 1 tablet (10 mg total) by mouth in the morning. 90 tablet 3  . docusate sodium (COLACE) 100 MG capsule Take 1 capsule (100 mg total) by mouth 3 (three) times daily as needed for mild constipation. 270 capsule 3  . ENTRESTO 97-103 MG Take 1 tablet by mouth 2 (two) times daily.    . furosemide (LASIX) 40 MG tablet Take 1 tablet (40 mg total) by mouth 2 (two) times daily. 180 tablet 3  . gabapentin (NEURONTIN) 300 MG capsule Take 1 capsule (300 mg total) by mouth 3 (three) times daily. 270 capsule 3  . glipiZIDE (GLUCOTROL) 10 MG tablet Take 1 tablet (10 mg  total) by mouth 2 (two) times daily before a meal. 180 tablet 3  . glucose monitoring kit (FREESTYLE) monitoring kit 1 each by Does not apply route as needed for other. 1 each 0  . insulin aspart (NOVOLOG) 100 UNIT/ML injection Inject 30 Units into the skin 3 (three) times daily before meals. 30 mL 3  . insulin glargine (LANTUS) 100 UNIT/ML injection Inject 0.6 mLs (60 Units total) into the skin daily. 30 mL 3  . ipratropium-albuterol (DUONEB) 0.5-2.5 (3) MG/3ML SOLN Take 3 mLs by nebulization every 6 (six) hours as needed (shortness of breath, wheeze). 360 mL 12  . losartan (COZAAR) 50 MG tablet Take 1 tablet (50 mg total) by mouth daily. 90 tablet 3  . metFORMIN (GLUCOPHAGE) 1000 MG tablet Take 1 tablet (1,000 mg total) by mouth 2 (two) times daily with a meal. 180 tablet 3  . methocarbamol (ROBAXIN) 750 MG tablet Take 1 tablet (750 mg total) by mouth 3 (three) times daily as needed for muscle spasms. 270 tablet 3  . Misc. Devices (BARIATRIC ROLLATOR) MISC 1 Device by Does not apply route daily. 1 each 0  . montelukast (SINGULAIR) 10 MG tablet Take 1 tablet (10 mg total) by mouth at bedtime. 90 tablet 3  . nicotine (NICODERM CQ - DOSED IN MG/24 HOURS) 21 mg/24hr patch Place 1 patch (21 mg total) onto the skin daily. 28 patch 0  . oxyCODONE-acetaminophen (PERCOCET/ROXICET) 5-325 MG tablet Take 1-2 tablets by mouth every 4 (four) hours as needed for severe pain. 15 tablet 0  . pantoprazole (PROTONIX) 40 MG tablet Take 1 tablet (40 mg total) by mouth daily. 90 tablet 3  . spironolactone (ALDACTONE) 25 MG tablet Take 1 tablet (25 mg total) by mouth daily. 90 tablet 3  . Tiotropium Bromide-Olodaterol (STIOLTO RESPIMAT) 2.5-2.5 MCG/ACT AERS Inhale 2 puffs into the lungs daily. 12 g 3  . Vitamin D, Ergocalciferol, (DRISDOL) 1.25 MG (50000 UNIT) CAPS capsule Take 1 capsule (50,000 Units total) by mouth every 7 (seven) days. 5 capsule 6  . fluticasone (FLONASE) 50 MCG/ACT nasal spray Place 1 spray into both  nostrils as needed for allergies or rhinitis. 30 g 3  . varenicline (CHANTIX) 0.5 MG tablet Take 1 tablet (0.5 mg total) by mouth 2 (two) times daily. (Patient not taking: No sig reported) 60 tablet 3   No facility-administered medications prior to visit.    Allergies  Allergen Reactions  . Aspirin Other (See Comments)    Abdominal pain    ROS Review of Systems  HENT: Negative.   Respiratory: Positive for cough (occasional ), shortness of breath (frequent) and  wheezing (occasional ).        Currently on 3 liters of continuous oxygen.   Cardiovascular: Positive for leg swelling (PAD).  Musculoskeletal: Positive for arthralgias (generalized joint pain).  Skin: Positive for color change.       Swelling/pain bilateral lower extremities.  Neurological: Positive for dizziness (occasional ).  Psychiatric/Behavioral: Negative.       Objective:    Physical Exam Vitals and nursing note reviewed.  Constitutional:      Appearance: Normal appearance. She is obese.     Comments: Rollator.   Cardiovascular:     Rate and Rhythm: Normal rate and regular rhythm.     Pulses: Normal pulses.     Heart sounds: Normal heart sounds.  Pulmonary:     Effort: Pulmonary effort is normal.     Breath sounds: Normal breath sounds.     Comments: 3 liters continuous oxygen via nasal cannula.  Musculoskeletal:     Right lower leg: Edema present.     Left lower leg: Edema present.     Comments: Bilateral lower extremity pain/swelling/erythem/weeping r/t chronic PAD.   Skin:    General: Skin is warm.     Findings: Erythema present.  Neurological:     Mental Status: She is alert.     BP (!) 173/83   Pulse (!) 101   Temp 98.1 F (36.7 C) (Oral)   Wt (!) 309 lb (140.2 kg)   SpO2 100%   BMI 48.40 kg/m  Wt Readings from Last 3 Encounters:  10/22/20 (!) 309 lb (140.2 kg)  10/04/20 (!) 312 lb (141.5 kg)  06/03/20 (!) 316 lb 6.4 oz (143.5 kg)     Health Maintenance Due  Topic Date Due  .  Hepatitis C Screening  Never done  . FOOT EXAM  Never done  . OPHTHALMOLOGY EXAM  Never done  . TETANUS/TDAP  Never done  . PAP SMEAR-Modifier  Never done  . MAMMOGRAM  Never done  . COVID-19 Vaccine (3 - Booster for Moderna series) 07/14/2020    There are no preventive care reminders to display for this patient.  Lab Results  Component Value Date   TSH 1.060 10/04/2020   Lab Results  Component Value Date   WBC 4.5 10/04/2020   HGB 14.6 10/04/2020   HCT 43.5 10/04/2020   MCV 86 10/04/2020   PLT 195 10/04/2020   Lab Results  Component Value Date   NA 139 10/04/2020   K 3.6 10/04/2020   CO2 29 10/04/2020   GLUCOSE 284 (H) 10/04/2020   BUN 10 10/04/2020   CREATININE 0.67 10/04/2020   BILITOT 0.5 10/04/2020   ALKPHOS 89 10/04/2020   AST 15 10/04/2020   ALT 18 10/04/2020   PROT 6.9 10/04/2020   ALBUMIN 4.1 10/04/2020   CALCIUM 9.5 10/04/2020   ANIONGAP 9 08/29/2020   GFR 128.72 03/01/2020   Lab Results  Component Value Date   CHOL 171 10/04/2020   Lab Results  Component Value Date   HDL 60 10/04/2020   Lab Results  Component Value Date   LDLCALC 90 10/04/2020   Lab Results  Component Value Date   TRIG 121 10/04/2020   Lab Results  Component Value Date   CHOLHDL 2.9 10/04/2020   Lab Results  Component Value Date   HGBA1C 14.5 (A) 10/04/2020   HGBA1C 14.5 10/04/2020   HGBA1C 14.5 (A) 10/04/2020   HGBA1C 14.5 (A) 10/04/2020    Assessment & Plan:   1. Encounter for cervical  Pap smear with pelvic exam Physical assessment within normal for age.  Follow-up for scheduled mammogram as needed.  Recommend monthly self breast exam Recommend daily multivitamin for women Recommend strength training in 150 minutes of cardiovascular exercise per week - IGP, rfx Aptima HPV ASCU  2. Type 2 diabetes mellitus with hyperglycemia, with long-term current use of insulin [facility]) She will keep initial visit with Endocrinology on 11/05/2020.  She will continue medication  as prescribed, to decrease foods/beverages high in sugars and carbs and follow Heart Healthy or DASH diet. Increase physical activity to at least 30 minutes cardio exercise daily.   3. Follow up She will follow up in [address].  Meds ordered this encounter  Medications  . fluticasone (FLONASE) 50 MCG/ACT nasal spray    Sig: Place 1 spray into both nostrils daily.    Dispense:  30 g    Refill:  11    No orders of the defined types were placed in this encounter.   Referral Orders  No referral(s) requested today    Kathe Becton, MSN, ANE, FNP-BC [facility] [place] [address] [place], [place] [address] [phone] fax   Problem List Items Addressed This Visit   None   Visit Diagnoses    Encounter for cervical Pap smear with pelvic exam    -  Primary   Relevant Orders   IGP, rfx Aptima HPV ASCU   Type 2 diabetes mellitus with hyperglycemia, with long-term current use of insulin ([place])       Follow up       Seasonal allergies       Relevant Medications   fluticasone (FLONASE) 50 MCG/ACT nasal spray      Meds ordered this encounter  Medications  . fluticasone (FLONASE) 50 MCG/ACT nasal spray    Sig: Place 1 spray into both nostrils daily.    Dispense:  30 g    Refill:  11    Follow-up: No follow-ups on file.    Azzie Glatter, FNP

## 2020-10-22 NOTE — Patient Instructions (Signed)

## 2020-10-25 ENCOUNTER — Other Ambulatory Visit: Payer: Self-pay | Admitting: Family Medicine

## 2020-10-25 ENCOUNTER — Telehealth (INDEPENDENT_AMBULATORY_CARE_PROVIDER_SITE_OTHER): Payer: Self-pay

## 2020-10-25 DIAGNOSIS — E1165 Type 2 diabetes mellitus with hyperglycemia: Secondary | ICD-10-CM

## 2020-10-25 DIAGNOSIS — Z794 Long term (current) use of insulin: Secondary | ICD-10-CM

## 2020-10-25 MED ORDER — INSULIN LISPRO 100 UNIT/ML ~~LOC~~ SOLN: 30.0000 [IU] | Freq: Three times a day (TID) | SUBCUTANEOUS | 3 refills | Status: DC

## 2020-10-25 NOTE — Telephone Encounter (Signed)
The pt called and left a VM on the nurses line wanting to know if she had to pay her co pay for her visit tomorrow before she could be seen . I returned the pt's call and made her aware that we do ask pt's to pay what ever that can afford other wise thy will receive a bill.

## 2020-10-26 ENCOUNTER — Encounter (INDEPENDENT_AMBULATORY_CARE_PROVIDER_SITE_OTHER): Payer: Self-pay | Admitting: Vascular Surgery

## 2020-10-26 ENCOUNTER — Other Ambulatory Visit: Payer: Self-pay | Admitting: Family Medicine

## 2020-10-26 ENCOUNTER — Ambulatory Visit (INDEPENDENT_AMBULATORY_CARE_PROVIDER_SITE_OTHER): Payer: Medicare Other | Admitting: Vascular Surgery

## 2020-10-26 ENCOUNTER — Other Ambulatory Visit: Payer: Self-pay

## 2020-10-26 VITALS — BP 152/84 | HR 84 | Ht 66.0 in | Wt 314.0 lb

## 2020-10-26 DIAGNOSIS — L97209 Non-pressure chronic ulcer of unspecified calf with unspecified severity: Secondary | ICD-10-CM | POA: Insufficient documentation

## 2020-10-26 DIAGNOSIS — J9610 Chronic respiratory failure, unspecified whether with hypoxia or hypercapnia: Secondary | ICD-10-CM | POA: Diagnosis not present

## 2020-10-26 DIAGNOSIS — L97201 Non-pressure chronic ulcer of unspecified calf limited to breakdown of skin: Secondary | ICD-10-CM | POA: Diagnosis not present

## 2020-10-26 DIAGNOSIS — J45909 Unspecified asthma, uncomplicated: Secondary | ICD-10-CM

## 2020-10-26 DIAGNOSIS — R87610 Atypical squamous cells of undetermined significance on cytologic smear of cervix (ASC-US): Secondary | ICD-10-CM

## 2020-10-26 DIAGNOSIS — R600 Localized edema: Secondary | ICD-10-CM

## 2020-10-26 DIAGNOSIS — R062 Wheezing: Secondary | ICD-10-CM

## 2020-10-26 DIAGNOSIS — E119 Type 2 diabetes mellitus without complications: Secondary | ICD-10-CM

## 2020-10-26 DIAGNOSIS — Z794 Long term (current) use of insulin: Secondary | ICD-10-CM | POA: Diagnosis not present

## 2020-10-26 DIAGNOSIS — I10 Essential (primary) hypertension: Secondary | ICD-10-CM | POA: Diagnosis not present

## 2020-10-26 DIAGNOSIS — M7989 Other specified soft tissue disorders: Secondary | ICD-10-CM | POA: Insufficient documentation

## 2020-10-26 DIAGNOSIS — R87619 Unspecified abnormal cytological findings in specimens from cervix uteri: Secondary | ICD-10-CM

## 2020-10-26 DIAGNOSIS — R0602 Shortness of breath: Secondary | ICD-10-CM

## 2020-10-26 HISTORY — DX: Unspecified abnormal cytological findings in specimens from cervix uteri: R87.619

## 2020-10-26 HISTORY — DX: Atypical squamous cells of undetermined significance on cytologic smear of cervix (ASC-US): R87.610

## 2020-10-26 MED ORDER — ALBUTEROL SULFATE HFA 108 (90 BASE) MCG/ACT IN AERS
2.0000 | INHALATION_SPRAY | RESPIRATORY_TRACT | 3 refills | Status: DC | PRN
Start: 2020-10-26 — End: 2020-12-27

## 2020-10-26 NOTE — Assessment & Plan Note (Signed)
The patient has weeping and skin breakdown to bilateral lower extremities due to the excessive swelling.  We are going to wrap her in an Unna boot on both lower extremities today.  These will be changed weekly.  The patient will require several weeks in Unna boots to get the skin healed and the swelling under better control then her venous work-up and consideration for lymphedema pump can begin

## 2020-10-26 NOTE — Patient Instructions (Signed)

## 2020-10-26 NOTE — Assessment & Plan Note (Signed)
On supplemental oxygen.  Cardiopulmonary disease is a likely major contributor to her lower extremity swelling.

## 2020-10-26 NOTE — Assessment & Plan Note (Signed)
blood pressure control important in reducing the progression of atherosclerotic disease. On appropriate oral medications.  

## 2020-10-26 NOTE — Assessment & Plan Note (Signed)
blood glucose control important in reducing the progression of atherosclerotic disease. Also, involved in wound healing. On appropriate medications.  

## 2020-10-26 NOTE — Assessment & Plan Note (Signed)
I have had a long discussion with the patient regarding swelling and why it  causes symptoms.  We are going to get her in Unna boots today to get the swelling under control and get the skin healed.  Once she is out of wound issues and Unna boots are off, we will get her in 20 to 30 mmHg compression stockings that she will need to wear daily starting first thing in the morning.  In addition, behavioral modification will be initiated.  This will include frequent elevation, use of over the counter pain medications and exercise such as walking.  I have reviewed systemic causes for chronic edema such as liver, kidney and cardiac etiologies.  The patient denies problems with these organ systems.    Consideration for a lymph pump will also be made based upon the effectiveness of conservative therapy.  This would help to improve the edema control and prevent sequela such as ulcers and infections   Patient should undergo duplex ultrasound of the venous system to ensure that DVT or reflux is not present.  The patient will follow-up with me after the ultrasound.

## 2020-10-26 NOTE — Progress Notes (Signed)
Patient ID: Sharon Swanson, female   DOB: 1960-10-20, 60 y.o.   MRN: [id number]  No chief complaint on file.   HPI Sharon Swanson is a 60 y.o. female.  I am asked to see the patient by [facility] for evaluation of leg swelling and weeping.  This is now been going on for 2 to 3 months.  Both lower extremities are affected.  There is not a clear cause or inciting event that started the symptoms.  Nothing is really made it better other than pain medications dulling the pain.  She denies any fevers or chills.  No signs of systemic infection.  The fluid draining has been a clear fluid from the blistering on both lower legs.  She has multiple medical comorbidities as listed below and has been on supplemental oxygen for respiratory failure.  She has good kidney function as far she knows.  She has no previous history of DVT or superficial thrombophlebitis to her knowledge.     Past Medical History:  Diagnosis Date  . Acid reflux   . Asthma   . Bilateral lower extremity edema 01/2020  . COPD (chronic obstructive pulmonary disease) ([place])   . CPAP (continuous positive airway pressure) dependence   . Diabetes ([place])   . Diabetes mellitus without complication ([place])    Type II  . Fatty liver 11/08/2011  . Fibroids 11/08/2011  . Hyperglycemia   . Hyperlipidemia 05/2020  . Hypertension   . Microalbuminuria 01/2020  . Obesity (BMI 30-39.9) 11/08/2011  . Osteoarthritis of right acromioclavicular joint   . Pancreatitis 11/08/2011  . Rotator cuff tear, right   . Shortness of breath on exertion 01/2020  . Sleep apnea   . Ventral hernia   . Vitamin D deficiency 05/2020    Past Surgical History:  Procedure Laterality Date  . ORTHOPEDIC SURGERY     R ankle, tendonitis     Family History  Problem Relation Age of Onset  . Hypertension Mother   . Diabetes Other   no bleeding or clotting disorders   Social History   Tobacco Use  . Smoking status: Current Every Day Smoker    Packs/day: 0.50     Years: 39.00    Pack years: 19.50    Types: Cigarettes    Start date: 09/25/1980  . Smokeless tobacco: Never Used  . Tobacco comment: pt states she smoke 2 cigerettes a day  Vaping Use  . Vaping Use: Never used  Substance Use Topics  . Alcohol use: No  . Drug use: No     Allergies  Allergen Reactions  . Aspirin Other (See Comments)    Abdominal pain    Current Outpatient Medications  Medication Sig Dispense Refill  . albuterol (PROAIR HFA) 108 (90 Base) MCG/ACT inhaler Inhale 2 puffs into the lungs every 4 (four) hours as needed for wheezing or shortness of breath. 24 each 3  . atorvastatin (LIPITOR) 20 MG tablet Take 1 tablet (20 mg total) by mouth daily. 90 tablet 3  . Blood Glucose Monitoring Suppl (TRUE METRIX METER) w/Device KIT USE AS DIRECTED 1 kit 0  . carvedilol (COREG) 6.25 MG tablet Take 1 tablet (6.25 mg total) by mouth 2 (two) times daily. 180 tablet 3  . cetirizine (ZYRTEC) 10 MG tablet Take 1 tablet (10 mg total) by mouth in the morning. 90 tablet 3  . docusate sodium (COLACE) 100 MG capsule Take 1 capsule (100 mg total) by mouth 3 (three) times daily as needed for  mild constipation. 270 capsule 3  . ENTRESTO 97-103 MG Take 1 tablet by mouth 2 (two) times daily.    . fluticasone (FLONASE) 50 MCG/ACT nasal spray Place 1 spray into both nostrils daily. 30 g 11  . furosemide (LASIX) 40 MG tablet Take 1 tablet (40 mg total) by mouth 2 (two) times daily. 180 tablet 3  . gabapentin (NEURONTIN) 300 MG capsule Take 1 capsule (300 mg total) by mouth 3 (three) times daily. 270 capsule 3  . glipiZIDE (GLUCOTROL) 10 MG tablet Take 1 tablet (10 mg total) by mouth 2 (two) times daily before a meal. 180 tablet 3  . glucose monitoring kit (FREESTYLE) monitoring kit 1 each by Does not apply route as needed for other. 1 each 0  . insulin glargine (LANTUS) 100 UNIT/ML injection Inject 0.6 mLs (60 Units total) into the skin daily. 30 mL 3  . insulin lispro (HUMALOG) 100 UNIT/ML injection  Inject 0.3 mLs (30 Units total) into the skin 3 (three) times daily before meals. 90 mL 3  . ipratropium-albuterol (DUONEB) 0.5-2.5 (3) MG/3ML SOLN Take 3 mLs by nebulization every 6 (six) hours as needed (shortness of breath, wheeze). 360 mL 12  . losartan (COZAAR) 50 MG tablet Take 1 tablet (50 mg total) by mouth daily. 90 tablet 3  . metFORMIN (GLUCOPHAGE) 1000 MG tablet Take 1 tablet (1,000 mg total) by mouth 2 (two) times daily with a meal. 180 tablet 3  . methocarbamol (ROBAXIN) 750 MG tablet Take 1 tablet (750 mg total) by mouth 3 (three) times daily as needed for muscle spasms. 270 tablet 3  . Misc. Devices (BARIATRIC ROLLATOR) MISC 1 Device by Does not apply route daily. 1 each 0  . montelukast (SINGULAIR) 10 MG tablet Take 1 tablet (10 mg total) by mouth at bedtime. 90 tablet 3  . nicotine (NICODERM CQ - DOSED IN MG/24 HOURS) 21 mg/24hr patch Place 1 patch (21 mg total) onto the skin daily. 28 patch 0  . oxyCODONE-acetaminophen (PERCOCET/ROXICET) 5-325 MG tablet Take 1-2 tablets by mouth every 4 (four) hours as needed for severe pain. 15 tablet 0  . pantoprazole (PROTONIX) 40 MG tablet Take 1 tablet (40 mg total) by mouth daily. 90 tablet 3  . spironolactone (ALDACTONE) 25 MG tablet Take 1 tablet (25 mg total) by mouth daily. 90 tablet 3  . Tiotropium Bromide-Olodaterol (STIOLTO RESPIMAT) 2.5-2.5 MCG/ACT AERS Inhale 2 puffs into the lungs daily. 12 g 3  . varenicline (CHANTIX) 0.5 MG tablet Take 1 tablet (0.5 mg total) by mouth 2 (two) times daily. 60 tablet 3  . Vitamin D, Ergocalciferol, (DRISDOL) 1.25 MG (50000 UNIT) CAPS capsule Take 1 capsule (50,000 Units total) by mouth every 7 (seven) days. 5 capsule 6  . Alcohol Swabs (B-D SINGLE USE SWABS REGULAR) PADS SMARTSIG:1 [place](s) Topical 3 Times Daily    . Blood Glucose Calibration (TRUE METRIX LEVEL 1) Low SOLN     . methocarbamol (ROBAXIN) 500 MG tablet Take 500 mg by mouth 3 (three) times daily.    . TRUE METRIX BLOOD GLUCOSE TEST  test strip 1 each 3 (three) times daily.    . TRUEplus Lancets 33G MISC Apply 1 each topically 3 (three) times daily.     No current facility-administered medications for this visit.      REVIEW OF SYSTEMS (Negative unless checked)  Constitutional: [] Weight loss  [] Fever  [] Chills Cardiac: [] Chest pain   [] Chest pressure   [] Palpitations   [] Shortness of breath when laying flat   [  x]Shortness of breath at rest   [x] Shortness of breath with exertion. Vascular:  [x] Pain in legs with walking   [x] Pain in legs at rest   [x] Pain in legs when laying flat   [] Claudication   [] Pain in feet when walking  [] Pain in feet at rest  [] Pain in feet when laying flat   [] History of DVT   [] Phlebitis   [x] Swelling in legs   [] Varicose veins   [] Non-healing ulcers Pulmonary:   [x] Uses home oxygen   [] Productive cough   [] Hemoptysis   [] Wheeze  [x] COPD   [x] Asthma Neurologic:  [] Dizziness  [] Blackouts   [] Seizures   [] History of stroke   [] History of TIA  [] Aphasia   [] Temporary blindness   [] Dysphagia   [] Weakness or numbness in arms   [] Weakness or numbness in legs Musculoskeletal:  [x] Arthritis   [] Joint swelling   [] Joint pain   [] Low back pain Hematologic:  [] Easy bruising  [] Easy bleeding   [] Hypercoagulable state   [] Anemic  [] Hepatitis Gastrointestinal:  [] Blood in stool   [] Vomiting blood  [x] Gastroesophageal reflux/heartburn   [] Abdominal pain Genitourinary:  [] Chronic kidney disease   [] Difficult urination  [] Frequent urination  [] Burning with urination   [] Hematuria Skin:  [] Rashes   [x] Ulcers   [x] Wounds Psychological:  [] History of anxiety   []  History of major depression.    Physical Exam BP (!) 152/84   Pulse 84   Ht 5' 6"  (1.676 m)   Wt (!) 314 lb (142.4 kg)   BMI 50.68 kg/m  Gen:  WD/WN, NAD.  Obese Head: [place]/AT, No temporalis wasting.  Ear/Nose/Throat: Hearing grossly intact, nares w/o erythema or drainage, oropharynx w/o Erythema/Exudate Eyes: Conjunctiva clear, sclera non-icteric   Neck: trachea midline.  No JVD.  Pulmonary:  Good air movement, respirations not labored on supplemental oxygen Cardiac: RRR, no JVD Vascular:  Vessel Right Left  Radial Palpable Palpable                          DP 1+ 2+  PT NP NP   Gastrointestinal:. No masses, surgical incisions, or scars. Musculoskeletal: M/S 5/5 throughout.  Extremities without ischemic changes.  No deformity or atrophy.  Blistering of the skin on bilateral lower extremities with weeping.  Moderate stasis dermatitis changes.  3+ bilateral lower extremity edema. Neurologic: Sensation grossly intact in extremities.  Symmetrical.  Speech is fluent. Motor exam as listed above. Psychiatric: Judgment intact, Mood & affect appropriate for pt's clinical situation. Dermatologic: Superficial skin breakdown to bilateral lower extremities in the calf and lower leg area with weeping of serous fluid.  No erythema.    Radiology No results found.  Labs Recent Results (from the past 2160 hour(s))  CBG monitoring, ED     Status: Abnormal   Collection Time: 08/29/20  8:14 AM  Result Value Ref Range   Glucose-Capillary 478 (H) 70 - 99 mg/dL    Comment: Glucose reference range applies only to samples taken after fasting for at least 8 hours.  CBC with Differential/Platelet     Status: None   Collection Time: 08/29/20  8:16 AM  Result Value Ref Range   WBC 5.5 4.0 - 10.5 K/uL   RBC 4.87 3.87 - 5.11 MIL/uL   Hemoglobin 14.1 12.0 - 15.0 g/dL   HCT 44.3 36.0 - 46.0 %   MCV 91.0 80.0 - 100.0 fL   MCH 29.0 26.0 - 34.0 pg   MCHC 31.8 30.0 - 36.0 g/dL   RDW  12.8 11.5 - 15.5 %   Platelets 184 150 - 400 K/uL   nRBC 0.0 0.0 - 0.2 %   Neutrophils Relative % 66 %   Neutro Abs 3.7 1.7 - 7.7 K/uL   Lymphocytes Relative 18 %   Lymphs Abs 1.0 0.7 - 4.0 K/uL   Monocytes Relative 10 %   Monocytes Absolute 0.6 0.1 - 1.0 K/uL   Eosinophils Relative 5 %   Eosinophils Absolute 0.3 0.0 - 0.5 K/uL   Basophils Relative 1 %   Basophils  Absolute 0.0 0.0 - 0.1 K/uL   Immature Granulocytes 0 %   Abs Immature Granulocytes 0.02 0.00 - 0.07 K/uL    Comment: Performed at [facility], [place] [address], [place], [place] [address]  Basic metabolic panel     Status: Abnormal   Collection Time: 08/29/20  8:16 AM  Result Value Ref Range   Sodium 132 (L) 135 - 145 mmol/L   Potassium 4.3 3.5 - 5.1 mmol/L   Chloride 93 (L) 98 - 111 mmol/L   CO2 30 22 - 32 mmol/L   Glucose, Bld 541 (HH) 70 - 99 mg/dL    Comment: Glucose reference range applies only to samples taken after fasting for at least 8 hours. CRITICAL RESULT CALLED TO, READ BACK BY AND VERIFIED WITH: WOODY,A RN @1005  ON 08/29/20 JACKSON,K    BUN 19 6 - 20 mg/dL   Creatinine, Ser 0.76 0.44 - 1.00 mg/dL   Calcium 9.6 8.9 - 10.3 mg/dL   GFR, Estimated >60 >60 mL/min    Comment: (NOTE) Calculated using the CKD-EPI Creatinine Equation (2021)    Anion gap 9 5 - 15    Comment: Performed at [facility], [place] [address], [place], [place] [address]  Urinalysis, Routine w reflex microscopic Urine, Clean Catch     Status: Abnormal   Collection Time: 08/29/20  8:16 AM  Result Value Ref Range   Color, Urine STRAW (A) YELLOW   APPearance CLEAR CLEAR   Specific Gravity, Urine 1.025 1.005 - 1.030   pH 7.0 5.0 - 8.0   Glucose, UA >=500 (A) NEGATIVE mg/dL   Hgb urine dipstick NEGATIVE NEGATIVE   Bilirubin Urine NEGATIVE NEGATIVE   Ketones, ur 5 (A) NEGATIVE mg/dL   Protein, ur NEGATIVE NEGATIVE mg/dL   Nitrite NEGATIVE NEGATIVE   Leukocytes,Ua TRACE (A) NEGATIVE   RBC / HPF 0-5 0 - 5 RBC/hpf   WBC, UA 0-5 0 - 5 WBC/hpf   Bacteria, UA RARE (A) NONE SEEN   Squamous Epithelial / LPF 0-5 0 - 5    Comment: Performed at [facility], [place] [address], [place], [place] [address]  Blood gas, venous     Status: Abnormal   Collection Time: 08/29/20  8:17 AM  Result Value Ref Range   pH, Ven 7.366 7.250 - 7.430   pCO2, Ven 54.8 44.0 -  60.0 mmHg   pO2, Ven 70.3 (H) 32.0 - 45.0 mmHg   Bicarbonate 30.7 (H) 20.0 - 28.0 mmol/L   Acid-Base Excess 4.3 (H) 0.0 - 2.0 mmol/L   O2 Saturation 93.3 %   Patient temperature 98.6     Comment: Performed at [facility], [place] [address], [place],  [address]  POC CBG, ED     Status: Abnormal   Collection Time: 08/29/20  9:47 AM  Result Value Ref Range   Glucose-Capillary 421 (H) 70 - 99 mg/dL    Comment: Glucose reference range applies  only to samples taken after fasting for at least 8 hours.  POC CBG, ED     Status: Abnormal   Collection Time: 08/29/20 11:33 AM  Result Value Ref Range   Glucose-Capillary 388 (H) 70 - 99 mg/dL    Comment: Glucose reference range applies only to samples taken after fasting for at least 8 hours.  HgB A1c     Status: Abnormal   Collection Time: 10/04/20  8:32 AM  Result Value Ref Range   Hemoglobin A1C 14.5 (A) 4.0 - 5.6 %   HbA1c POC (<> result, manual entry) 14.5 4.0 - 5.6 %   HbA1c, POC (prediabetic range) 14.5 (A) 5.7 - 6.4 %   HbA1c, POC (controlled diabetic range) 14.5 (A) 0.0 - 7.0 %  POCT Urinalysis Dipstick     Status: Abnormal   Collection Time: 10/04/20  8:47 AM  Result Value Ref Range   Color, UA yellow    Clarity, UA clear    Glucose, UA Negative Negative   Bilirubin, UA neg    Ketones, UA neg    Spec Grav, UA 1.020 1.010 - 1.025   Blood, UA neg    pH, UA 7.0 5.0 - 8.0   Protein, UA Negative Negative   Urobilinogen, UA 0.2 0.2 or 1.0 E.U./dL   Nitrite, UA neg    Leukocytes, UA Trace (A) Negative   Appearance     Odor    CBC with Differential     Status: None   Collection Time: 10/04/20 10:19 AM  Result Value Ref Range   WBC 4.5 3.4 - 10.8 x10E3/uL   RBC 5.08 3.77 - 5.28 x10E6/uL   Hemoglobin 14.6 11.1 - 15.9 g/dL   Hematocrit 43.5 34.0 - 46.6 %   MCV 86 79 - 97 fL   MCH 28.7 26.6 - 33.0 pg   MCHC 33.6 31.5 - 35.7 g/dL   RDW 12.2 11.7 - 15.4 %   Platelets 195 150 - 450 x10E3/uL   Neutrophils 64  Not Estab. %   Lymphs 22 Not Estab. %   Monocytes 10 Not Estab. %   Eos 3 Not Estab. %   Basos 1 Not Estab. %   Neutrophils Absolute 2.9 1.4 - 7.0 x10E3/uL   Lymphocytes Absolute 1.0 0.7 - 3.1 x10E3/uL   Monocytes Absolute 0.5 0.1 - 0.9 x10E3/uL   EOS (ABSOLUTE) 0.1 0.0 - 0.4 x10E3/uL   Basophils Absolute 0.0 0.0 - 0.2 x10E3/uL   Immature Granulocytes 0 Not Estab. %   Immature Grans (Abs) 0.0 0.0 - 0.1 x10E3/uL  Comprehensive metabolic panel     Status: Abnormal   Collection Time: 10/04/20 10:19 AM  Result Value Ref Range   Glucose 284 (H) 65 - 99 mg/dL   BUN 10 6 - 24 mg/dL   Creatinine, Ser 0.67 0.57 - 1.00 mg/dL   GFR calc non Af Amer 97 >59 mL/min/1.73   GFR calc Af Amer 111 >59 mL/min/1.73    Comment: **In accordance with recommendations from the NKF-ASN Task force,**   [facility] is in the process of updating its eGFR calculation to the   2021 CKD-EPI creatinine equation that estimates kidney function   without a race variable.    BUN/Creatinine Ratio 15 9 - 23   Sodium 139 134 - 144 mmol/L   Potassium 3.6 3.5 - 5.2 mmol/L   Chloride 94 (L) 96 - 106 mmol/L   CO2 29 20 - 29 mmol/L   Calcium 9.5 8.7 - 10.2 mg/dL  Total Protein 6.9 6.0 - 8.5 g/dL   Albumin 4.1 3.8 - 4.9 g/dL   Globulin, Total 2.8 1.5 - 4.5 g/dL   Albumin/Globulin Ratio 1.5 1.2 - 2.2   Bilirubin Total 0.5 0.0 - 1.2 mg/dL   Alkaline Phosphatase 89 44 - 121 IU/L    Comment:               **Please note reference interval change**   AST 15 0 - 40 IU/L   ALT 18 0 - 32 IU/L  TSH     Status: None   Collection Time: 10/04/20 10:19 AM  Result Value Ref Range   TSH 1.060 0.450 - 4.500 uIU/mL  Lipid Panel     Status: None   Collection Time: 10/04/20 10:19 AM  Result Value Ref Range   Cholesterol, Total 171 100 - 199 mg/dL   Triglycerides 121 0 - 149 mg/dL   HDL 60 >39 mg/dL   VLDL Cholesterol Cal 21 5 - 40 mg/dL   LDL Chol Calc (NIH) 90 0 - 99 mg/dL   Chol/HDL Ratio 2.9 0.0 - 4.4 ratio    Comment:                                    T. Chol/HDL Ratio                                             Men  Women                               1/2 Avg.Risk  3.4    3.3                                   Avg.Risk  5.0    4.4                                2X Avg.Risk  9.6    7.1                                3X Avg.Risk 23.4   11.0   Vitamin B12     Status: None   Collection Time: 10/04/20 10:19 AM  Result Value Ref Range   Vitamin B-12 565 232 - 1,245 pg/mL  Vitamin D, 25-hydroxy     Status: Abnormal   Collection Time: 10/04/20 10:19 AM  Result Value Ref Range   Vit D, 25-Hydroxy 11.4 (L) 30.0 - 100.0 ng/mL    Comment: Vitamin D deficiency has been defined by the [place] practice guideline as a level of serum 25-OH vitamin D less than 20 ng/mL (1,2). The Endocrine Society went on to further define vitamin D insufficiency as a level between 21 and 29 ng/mL (2). 1. IOM (Institute of Medicine). 2010. Dietary reference    intakes for calcium and D. [place]: The    [employer]. 2. Holick MF, Binkley [place], Bischoff-Ferrari HA, et al.    Evaluation, treatment, and prevention of vitamin D  deficiency: an Endocrine Society clinical practice    guideline. JCEM. 2011 Jul; 96(7):1911-30.     Assessment/Plan:  Lower limb ulcer, calf ([place]) The patient has weeping and skin breakdown to bilateral lower extremities due to the excessive swelling.  We are going to wrap her in an Unna boot on both lower extremities today.  These will be changed weekly.  The patient will require several weeks in Unna boots to get the skin healed and the swelling under better control then her venous work-up and consideration for lymphedema pump can begin  Swelling of limb I have had a long discussion with the patient regarding swelling and why it  causes symptoms.  We are going to get her in Unna boots today to get the swelling under control and get the skin healed.  Once she is  out of wound issues and Unna boots are off, we will get her in 20 to 30 mmHg compression stockings that she will need to wear daily starting first thing in the morning.  In addition, behavioral modification will be initiated.  This will include frequent elevation, use of over the counter pain medications and exercise such as walking.  I have reviewed systemic causes for chronic edema such as liver, kidney and cardiac etiologies.  The patient denies problems with these organ systems.    Consideration for a lymph pump will also be made based upon the effectiveness of conservative therapy.  This would help to improve the edema control and prevent sequela such as ulcers and infections   Patient should undergo duplex ultrasound of the venous system to ensure that DVT or reflux is not present.  The patient will follow-up with me after the ultrasound.    Chronic respiratory failure (HCC) On supplemental oxygen.  Cardiopulmonary disease is a likely major contributor to her lower extremity swelling.  Essential hypertension blood pressure control important in reducing the progression of atherosclerotic disease. On appropriate oral medications.   Insulin-requiring or dependent type II diabetes mellitus ([place]) blood glucose control important in reducing the progression of atherosclerotic disease. Also, involved in wound healing. On appropriate medications.       Leotis Pain 10/26/2020, 9:12 AM   This note was created with Dragon medical transcription system.  Any errors from dictation are unintentional.

## 2020-10-27 ENCOUNTER — Other Ambulatory Visit: Payer: Self-pay | Admitting: Family Medicine

## 2020-10-27 DIAGNOSIS — G4733 Obstructive sleep apnea (adult) (pediatric): Secondary | ICD-10-CM | POA: Diagnosis not present

## 2020-10-27 MED ORDER — ENTRESTO 97-103 MG PO TABS: 1.0000 | ORAL_TABLET | Freq: Two times a day (BID) | ORAL | 3 refills | Status: DC

## 2020-10-29 LAB — IGP, RFX APTIMA HPV ASCU

## 2020-10-29 LAB — HPV APTIMA

## 2020-11-01 ENCOUNTER — Other Ambulatory Visit: Payer: Self-pay | Admitting: Family Medicine

## 2020-11-01 ENCOUNTER — Encounter: Payer: Self-pay | Admitting: Family Medicine

## 2020-11-01 DIAGNOSIS — R87610 Atypical squamous cells of undetermined significance on cytologic smear of cervix (ASC-US): Secondary | ICD-10-CM

## 2020-11-02 ENCOUNTER — Encounter (INDEPENDENT_AMBULATORY_CARE_PROVIDER_SITE_OTHER): Payer: Self-pay | Admitting: Nurse Practitioner

## 2020-11-02 ENCOUNTER — Other Ambulatory Visit: Payer: Self-pay

## 2020-11-02 ENCOUNTER — Ambulatory Visit (INDEPENDENT_AMBULATORY_CARE_PROVIDER_SITE_OTHER): Payer: Medicare Other | Admitting: Nurse Practitioner

## 2020-11-02 VITALS — BP 158/92 | HR 99 | Ht 66.0 in | Wt 307.0 lb

## 2020-11-02 DIAGNOSIS — L97201 Non-pressure chronic ulcer of unspecified calf limited to breakdown of skin: Secondary | ICD-10-CM

## 2020-11-02 NOTE — Progress Notes (Signed)
History of Present Illness  There is no documented history at this time  Assessments & Plan   There are no diagnoses linked to this encounter.    Additional instructions  Subjective:  Patient presents with venous ulcer of the Bilateral lower extremity.    Procedure:  3 layer unna wrap was placed Bilateral lower extremity.   Plan:   Follow up in one week.  

## 2020-11-03 ENCOUNTER — Ambulatory Visit: Payer: Self-pay | Admitting: Family Medicine

## 2020-11-05 ENCOUNTER — Ambulatory Visit: Payer: Medicare Other | Admitting: Endocrinology

## 2020-11-09 ENCOUNTER — Other Ambulatory Visit: Payer: Self-pay

## 2020-11-09 ENCOUNTER — Encounter (INDEPENDENT_AMBULATORY_CARE_PROVIDER_SITE_OTHER): Payer: Self-pay | Admitting: Nurse Practitioner

## 2020-11-09 ENCOUNTER — Ambulatory Visit (INDEPENDENT_AMBULATORY_CARE_PROVIDER_SITE_OTHER): Payer: Medicare Other | Admitting: Nurse Practitioner

## 2020-11-09 VITALS — BP 163/105 | HR 102 | Ht 66.0 in | Wt 308.0 lb

## 2020-11-09 DIAGNOSIS — L97201 Non-pressure chronic ulcer of unspecified calf limited to breakdown of skin: Secondary | ICD-10-CM | POA: Diagnosis not present

## 2020-11-09 NOTE — Progress Notes (Signed)
History of Present Illness  There is no documented history at this time  Assessments & Plan   There are no diagnoses linked to this encounter.    Additional instructions  Subjective:  Patient presents with venous ulcer of the Bilateral lower extremity.    Procedure:  3 layer unna wrap was placed Bilateral lower extremity.   Plan:   Follow up in one week.  

## 2020-11-10 ENCOUNTER — Ambulatory Visit: Payer: Medicare Other | Admitting: Obstetrics & Gynecology

## 2020-11-10 ENCOUNTER — Telehealth: Payer: Self-pay | Admitting: *Deleted

## 2020-11-10 ENCOUNTER — Encounter: Payer: Self-pay | Admitting: Obstetrics & Gynecology

## 2020-11-10 ENCOUNTER — Other Ambulatory Visit: Payer: Self-pay

## 2020-11-10 VITALS — BP 136/88 | Ht 66.0 in | Wt 312.0 lb

## 2020-11-10 DIAGNOSIS — N950 Postmenopausal bleeding: Secondary | ICD-10-CM | POA: Diagnosis not present

## 2020-11-10 DIAGNOSIS — R87610 Atypical squamous cells of undetermined significance on cytologic smear of cervix (ASC-US): Secondary | ICD-10-CM

## 2020-11-10 DIAGNOSIS — K429 Umbilical hernia without obstruction or gangrene: Secondary | ICD-10-CM

## 2020-11-10 NOTE — Progress Notes (Signed)
    Sharon Swanson 28-Jul-1961 [id number]        60 y.o.  G1P0000   RP: ASCUS for Colposcopy  HPI: First abnormal Pap per patient with ASCUS 09/2020, no HR HPV done.  No IC x many years.  Yeast on Pap treated.  C/O mild vaginal bleeding 2 months ago.  Postmenopausal x many years on no HRT.  C/O a large umbilical hernia with intermittent pain and more and more difficulty pushing it back in.  Severe obesity.   OB History  Gravida Para Term Preterm AB Living  1 1 0 0 0 0  SAB IAB Ectopic Multiple Live Births  0 0 0 0      # Outcome Date GA Lbr Len/2nd Weight Sex Delivery Anes PTL Lv  1 Para             Obstetric Comments   Daughter passed away at 53 yrs old     Past medical history,surgical history, problem list, medications, allergies, family history and social history were all reviewed and documented in the EPIC chart.   Directed ROS with pertinent positives and negatives documented in the history of present illness/assessment and plan.  Exam:  Vitals:   11/10/20 0848  BP: 136/88  Weight: (!) 312 lb (141.5 kg)  Height: 5\' 6"  (1.676 m)   General appearance:  Normal  Abdomen: Large umbilical hernia, reducible when laying down.  Colposcopy Procedure Note Sharon Swanson 11/10/2020  Indications:  ASCUS  Procedure Details  The risks and benefits of the procedure and Verbal informed consent obtained.  Speculum placed in vagina and excellent visualization of cervix achieved, cervix swabbed x 3 with acetic acid solution.  Findings:  Cervix colposcopy: Physical Exam Genitourinary:      Vaginal colposcopy: Normal  Vulvar colposcopy: Normal  Perirectal colposcopy: Normal  The cervix was sprayed with Hurricane before performing the cervical biopsies.  Specimens: HR HPV, HPV 16-18-45.  Cervical Bx at 3 O'Clock.  Complications:  None, good hemostasis with Silver Nitrate . Plan:  Management per results    Assessment/Plan:  60 y.o. G1P0000   1. Atypical  squamous cells of undetermined significance (ASCUS) on Papanicolaou smear of cervix ASCUS, no HPV HR done.  Counseling on abnormal Pap/HPV done.  Colposcopy procedure explained.  Colposcopy findings reviewed.  Postprocedure precautions discussed.  Management per results of Cervical Bx and HPV HR.  2. Umbilical hernia without obstruction and without gangrene Large umbilical hernia incarcerating intermittently.  Referred to General Surgery for management.  3. Postmenopausal bleeding Mild PMB x 2 months.  Not on HRT.  F/U Pelvic US/possible EBx for further investigation. - US Transvaginal Non-OB; Future  Sharon Bruins MD, 9:38 AM 11/10/2020

## 2020-11-10 NOTE — Telephone Encounter (Signed)
Referral message sent to referral coordinator at [facility]. She will call to schedule.

## 2020-11-10 NOTE — Telephone Encounter (Signed)
Pt is sch 12/08/20 @ 10:45 am with Dr. Zenia Resides. A sooner appt was offered and pt declined due to financial reasons.

## 2020-11-10 NOTE — Telephone Encounter (Signed)
-----   Message from Princess Bruins, MD sent at 11/10/2020  9:36 AM EST ----- Regarding: Urgent referral to general surgeon Large Umbilical Hernia with intermittent incarceration.

## 2020-11-11 ENCOUNTER — Encounter: Payer: Self-pay | Admitting: Obstetrics & Gynecology

## 2020-11-11 DIAGNOSIS — J449 Chronic obstructive pulmonary disease, unspecified: Secondary | ICD-10-CM | POA: Diagnosis not present

## 2020-11-11 DIAGNOSIS — G4733 Obstructive sleep apnea (adult) (pediatric): Secondary | ICD-10-CM | POA: Diagnosis not present

## 2020-11-12 LAB — TISSUE SPECIMEN

## 2020-11-12 LAB — PATHOLOGY REPORT

## 2020-11-15 LAB — HPV TYPE 16 AND 18/45 RNA
HPV Type 16 RNA: NOT DETECTED
HPV Type 18/45 RNA: NOT DETECTED

## 2020-11-17 ENCOUNTER — Other Ambulatory Visit: Payer: Self-pay

## 2020-11-17 ENCOUNTER — Ambulatory Visit (INDEPENDENT_AMBULATORY_CARE_PROVIDER_SITE_OTHER): Payer: Medicare Other

## 2020-11-17 ENCOUNTER — Ambulatory Visit (INDEPENDENT_AMBULATORY_CARE_PROVIDER_SITE_OTHER): Payer: Medicare Other | Admitting: Nurse Practitioner

## 2020-11-17 VITALS — BP 158/85 | HR 100 | Ht 67.0 in | Wt 313.0 lb

## 2020-11-17 DIAGNOSIS — R600 Localized edema: Secondary | ICD-10-CM | POA: Diagnosis not present

## 2020-11-17 DIAGNOSIS — Z720 Tobacco use: Secondary | ICD-10-CM | POA: Diagnosis not present

## 2020-11-17 DIAGNOSIS — L03119 Cellulitis of unspecified part of limb: Secondary | ICD-10-CM | POA: Diagnosis not present

## 2020-11-17 DIAGNOSIS — I10 Essential (primary) hypertension: Secondary | ICD-10-CM | POA: Diagnosis not present

## 2020-11-17 DIAGNOSIS — I890 Lymphedema, not elsewhere classified: Secondary | ICD-10-CM | POA: Diagnosis not present

## 2020-11-17 DIAGNOSIS — L97201 Non-pressure chronic ulcer of unspecified calf limited to breakdown of skin: Secondary | ICD-10-CM | POA: Diagnosis not present

## 2020-11-17 DIAGNOSIS — M7989 Other specified soft tissue disorders: Secondary | ICD-10-CM

## 2020-11-17 MED ORDER — CEPHALEXIN 500 MG PO CAPS: 500.0000 mg | ORAL_CAPSULE | Freq: Three times a day (TID) | ORAL | 0 refills | Status: DC

## 2020-11-18 ENCOUNTER — Other Ambulatory Visit (INDEPENDENT_AMBULATORY_CARE_PROVIDER_SITE_OTHER): Payer: Self-pay | Admitting: Nurse Practitioner

## 2020-11-18 ENCOUNTER — Other Ambulatory Visit: Payer: Medicare Other

## 2020-11-18 ENCOUNTER — Telehealth (INDEPENDENT_AMBULATORY_CARE_PROVIDER_SITE_OTHER): Payer: Self-pay

## 2020-11-18 ENCOUNTER — Other Ambulatory Visit: Payer: Medicare Other | Admitting: Obstetrics & Gynecology

## 2020-11-18 DIAGNOSIS — L03119 Cellulitis of unspecified part of limb: Secondary | ICD-10-CM

## 2020-11-18 MED ORDER — CEPHALEXIN 500 MG PO CAPS: 500.0000 mg | ORAL_CAPSULE | Freq: Three times a day (TID) | ORAL | 0 refills | Status: DC

## 2020-11-18 MED FILL — CEPHALEXIN 500 MG CAPSULE: 500 | 7 days supply | Qty: 21 | Fill #0

## 2020-11-18 NOTE — Telephone Encounter (Signed)
It was sent in.  And the system shows it was confirmed.  Please call the pharmacy and verify receipt.  Also call to confirm the pharmacy with the patient

## 2020-11-18 NOTE — Telephone Encounter (Signed)
The pt called and left a Vm on the nurses line saying that the Rx for 500 Mg Cephalexin was not sent in she called her pharmacy and confirmed that they do not have it.

## 2020-11-18 NOTE — Telephone Encounter (Signed)
I called pt a made her aware that [facility] [place]

## 2020-11-18 NOTE — Progress Notes (Signed)
(  Key: [id number]) NovoLOG 100UNIT/ML solution   Form [employer] Medicare Part D Electronic Prior Authorization Form (2017 NCPDP)

## 2020-11-18 NOTE — Telephone Encounter (Signed)
  Office Visit Open   11/17/2020 [facility]   Kris Hartmann, NP   Vascular Surgery  Cellulitis of lower extremity, unspecified laterality   Dx  Follow-up ; Referred by Azzie Glatter, FNP   Reason for Visit    Additional Documentation  Vitals:  BP 158/85Important    Pulse 100   Ht 5\' 7"  (1.702 m)   Wt 313 lb (142 kg)Important    BMI 49.02 kg/m   BSA 2.59 m       More Vitals   Flowsheets:  NEWS,   MEWS Score,   Anthropometrics,   Clinical Intake     Encounter Info:  Billing Info,   History,   Allergies,   Detailed Report      All Notes    Media From this encounter Electronic signature on 11/17/2020 8:13 AM - 1 of 3 e-signatures recorded   Communication Routing History  None  No questionnaires available.              Orders Placed   None   Medication Changes     Cephalexin 500 mg Oral 3 times daily    Medication List   Visit Diagnoses     Cellulitis of lower extremity, unspecified laterality

## 2020-11-19 ENCOUNTER — Telehealth: Payer: Self-pay

## 2020-11-19 MED FILL — NovoLOG 100 UNIT/ML SOLN: 100 | 30 days supply | Qty: 20 | Fill #2

## 2020-11-19 NOTE — Telephone Encounter (Signed)
Contacted pt to follow-up re: Humalog effectiveness for PA of Novolog, pt reported that she is not able to afford Humalog so has only been taking Lantus, Humalog is $95 for 3 month supply. Spoke w/ [facility] and was informed that Humalog is the only covered option, pt can request 30-day supply for $35. Also spoke w/ [place] [facility] and pt also has option to get medication from there and set up a payment plan. Pt is aware of options, voiced understanding and will contact one of the pharmacies to get medication. [facility] was contacted to cancel PA for Novolog.

## 2020-11-21 ENCOUNTER — Encounter (INDEPENDENT_AMBULATORY_CARE_PROVIDER_SITE_OTHER): Payer: Self-pay | Admitting: Nurse Practitioner

## 2020-11-21 NOTE — Progress Notes (Signed)
Subjective:    Patient ID: Sharon Swanson, female    DOB: Jul 13, 1961, 60 y.o.   MRN: [id number] Chief Complaint  Patient presents with  . Follow-up    4 week BIL unna boot check    Sharon Swanson is a 60 y.o. female.  The patient presents today for follow-up evaluation after being in [employer] wraps for several weeks.  Previously before the wraps the patient had weeping of her lower extremities with small ulcerations.  Today the weeping has largely stopped however there is some small ulceration still present.  The patient has multiple comorbidities likely affecting her leg swelling in addition to respiratory failure, for which she is on supplemental oxygen.  She denies any previous history of DVT or superficial thrombophlebitis.  She denies any fever chills.  Today noninvasive studies show no evidence of DVT or superficial thrombophlebitis.  No evidence superficial venous reflux or deep venous insufficiency seen bilaterally.      Review of Systems  Cardiovascular: Positive for leg swelling.  All other systems reviewed and are negative.      Objective:   Physical Exam Vitals reviewed.  Constitutional:      Appearance: She is obese.  HENT:     Head: Normocephalic.  Cardiovascular:     Rate and Rhythm: Normal rate.  Pulmonary:     Effort: Pulmonary effort is normal.  Musculoskeletal:     Right lower leg: 2+ Edema present.     Left lower leg: 2+ Edema present.  Neurological:     Mental Status: She is alert and oriented to person, place, and time.  Psychiatric:        Mood and Affect: Mood normal.        Behavior: Behavior normal.        Thought Content: Thought content normal.        Judgment: Judgment normal.     BP (!) 158/85   Pulse 100   Ht $R'5\' 7"'so$  (1.702 m)   Wt (!) 313 lb (142 kg)   BMI 49.02 kg/m   Past Medical History:  Diagnosis Date  . Abnormal Pap smear of cervix 10/2020  . Acid reflux   . Asthma   . Atypical squamous cells of undetermined significance  (ASC-US) on cervical Pap smear 10/2020  . Bilateral lower extremity edema 01/2020  . COPD (chronic obstructive pulmonary disease) ([place])   . CPAP (continuous positive airway pressure) dependence   . Diabetes ([place])   . Diabetes mellitus without complication ([place])    Type II  . Fatty liver 11/08/2011  . Fibroids 11/08/2011  . Hyperglycemia   . Hyperlipidemia 05/2020  . Hypertension   . Microalbuminuria 01/2020  . Obesity (BMI 30-39.9) 11/08/2011  . Osteoarthritis of right acromioclavicular joint   . Pancreatitis 11/08/2011  . Rotator cuff tear, right   . Shortness of breath on exertion 01/2020  . Sleep apnea   . Ventral hernia   . Vitamin D deficiency 05/2020    Social History   Socioeconomic History  . Marital status: Married    Spouse name: Not on file  . Number of children: Not on file  . Years of education: Not on file  . Highest education level: Not on file  Occupational History  . Not on file  Tobacco Use  . Smoking status: Current Every Day Smoker    Packs/day: 0.50    Years: 39.00    Pack years: 19.50    Types: Cigarettes    Start date: 09/25/1980  .  Smokeless tobacco: Never Used  . Tobacco comment: pt states she smoke 2 cigerettes a day  Vaping Use  . Vaping Use: Never used  Substance and Sexual Activity  . Alcohol use: No  . Drug use: No  . Sexual activity: Not Currently    Partners: Male  Other Topics Concern  . Not on file  Social History Narrative  . Not on file   Social Determinants of Health   Financial Resource Strain: Not on file  Food Insecurity: Not on file  Transportation Needs: Not on file  Physical Activity: Not on file  Stress: Not on file  Social Connections: Not on file  Intimate Partner Violence: Not on file    Past Surgical History:  Procedure Laterality Date  . ORTHOPEDIC SURGERY     R ankle, tendonitis    Family History  Problem Relation Age of Onset  . Hypertension Mother   . Diabetes Other   . Diabetes Maternal  Grandmother   . Hypertension Maternal Grandmother     Allergies  Allergen Reactions  . Aspirin Other (See Comments)    Abdominal pain    CBC Latest Ref Rng & Units 10/04/2020 08/29/2020 05/28/2020  WBC 3.4 - 10.8 x10E3/uL 4.5 5.5 4.5  Hemoglobin 11.1 - 15.9 g/dL 14.6 14.1 14.0  Hematocrit 34.0 - 46.6 % 43.5 44.3 43.0  Platelets 150 - 450 x10E3/uL 195 184 181      CMP     Component Value Date/Time   NA 139 10/04/2020 1019   K 3.6 10/04/2020 1019   CL 94 (L) 10/04/2020 1019   CO2 29 10/04/2020 1019   GLUCOSE 284 (H) 10/04/2020 1019   GLUCOSE 541 (HH) 08/29/2020 0816   BUN 10 10/04/2020 1019   CREATININE 0.67 10/04/2020 1019   CALCIUM 9.5 10/04/2020 1019   PROT 6.9 10/04/2020 1019   ALBUMIN 4.1 10/04/2020 1019   AST 15 10/04/2020 1019   ALT 18 10/04/2020 1019   ALKPHOS 89 10/04/2020 1019   BILITOT 0.5 10/04/2020 1019   GFRNONAA 97 10/04/2020 1019   GFRNONAA >60 08/29/2020 0816   GFRAA 111 10/04/2020 1019     No results found.     Assessment & Plan:   1. Cellulitis of lower extremity, unspecified laterality The patient has evidence of redness, warmth and irritation concerning for cellulitis.  We will treat the patient with Keflex and evaluate at next visit.  2. Lymphedema No surgery or intervention at this point in time.    I have had a long discussion with the patient regarding venous insufficiency and why it  causes symptoms, specifically venous ulceration . I have discussed with the patient the chronic skin changes that accompany venous insufficiency and the long term sequela such as infection and recurring  ulceration.  Patient will be placed in [employer] which will be changed weekly drainage permitting.  In addition, behavioral modification including several periods of elevation of the lower extremities during the day will be continued. Achieving a position with the ankles at heart level was stressed to the patient  The patient is instructed to begin routine  exercise, especially walking on a daily basis  Patient should undergo duplex ultrasound of the venous system to ensure that DVT or reflux is not present.  Following the review of the ultrasound the patient will follow up in four weeks to reassess the degree of swelling and the control that Unna therapy is offering.   The patient can be assessed for graduated compression stockings or  wraps as well as a Lymph Pump once the ulcers are healed.   3. Essential hypertension Continue antihypertensive medications as already ordered, these medications have been reviewed and there are no changes at this time.   4. Tobacco abuse Smoking cessation was discussed, 3-10 minutes spent on this topic specifically    Current Outpatient Medications on File Prior to Visit  Medication Sig Dispense Refill  . albuterol (PROAIR HFA) 108 (90 Base) MCG/ACT inhaler Inhale 2 puffs into the lungs every 4 (four) hours as needed for wheezing or shortness of breath. 24 each 3  . Alcohol Swabs (B-D SINGLE USE SWABS REGULAR) PADS SMARTSIG:1 Pledget(s) Topical 3 Times Daily    . atorvastatin (LIPITOR) 20 MG tablet Take 1 tablet (20 mg total) by mouth daily. 90 tablet 3  . Blood Glucose Calibration (TRUE METRIX LEVEL 1) Low SOLN     . Blood Glucose Monitoring Suppl (TRUE METRIX METER) w/Device KIT USE AS DIRECTED 1 kit 0  . carvedilol (COREG) 6.25 MG tablet Take 1 tablet (6.25 mg total) by mouth 2 (two) times daily. 180 tablet 3  . cetirizine (ZYRTEC) 10 MG tablet Take 1 tablet (10 mg total) by mouth in the morning. 90 tablet 3  . docusate sodium (COLACE) 100 MG capsule Take 1 capsule (100 mg total) by mouth 3 (three) times daily as needed for mild constipation. 270 capsule 3  . ENTRESTO 97-103 MG Take 1 tablet by mouth 2 (two) times daily. 180 tablet 3  . fluticasone (FLONASE) 50 MCG/ACT nasal spray Place 1 spray into both nostrils daily. 30 g 11  . furosemide (LASIX) 40 MG tablet Take 1 tablet (40 mg total) by mouth 2 (two)  times daily. 180 tablet 3  . gabapentin (NEURONTIN) 300 MG capsule Take 1 capsule (300 mg total) by mouth 3 (three) times daily. 270 capsule 3  . glipiZIDE (GLUCOTROL) 10 MG tablet Take 1 tablet (10 mg total) by mouth 2 (two) times daily before a meal. 180 tablet 3  . glucose monitoring kit (FREESTYLE) monitoring kit 1 each by Does not apply route as needed for other. 1 each 0  . insulin glargine (LANTUS) 100 UNIT/ML injection Inject 0.6 mLs (60 Units total) into the skin daily. 30 mL 3  . insulin lispro (HUMALOG) 100 UNIT/ML injection Inject 0.3 mLs (30 Units total) into the skin 3 (three) times daily before meals. 90 mL 3  . ipratropium-albuterol (DUONEB) 0.5-2.5 (3) MG/3ML SOLN Take 3 mLs by nebulization every 6 (six) hours as needed (shortness of breath, wheeze). 360 mL 12  . metFORMIN (GLUCOPHAGE) 1000 MG tablet Take 1 tablet (1,000 mg total) by mouth 2 (two) times daily with a meal. 180 tablet 3  . methocarbamol (ROBAXIN) 500 MG tablet Take 500 mg by mouth 3 (three) times daily.    . methocarbamol (ROBAXIN) 750 MG tablet Take 1 tablet (750 mg total) by mouth 3 (three) times daily as needed for muscle spasms. 270 tablet 3  . Misc. Devices (BARIATRIC ROLLATOR) MISC 1 Device by Does not apply route daily. 1 each 0  . montelukast (SINGULAIR) 10 MG tablet Take 1 tablet (10 mg total) by mouth at bedtime. 90 tablet 3  . nicotine (NICODERM CQ - DOSED IN MG/24 HOURS) 21 mg/24hr patch Place 1 patch (21 mg total) onto the skin daily. 28 patch 0  . oxyCODONE-acetaminophen (PERCOCET/ROXICET) 5-325 MG tablet Take 1-2 tablets by mouth every 4 (four) hours as needed for severe pain. 15 tablet 0  . pantoprazole (PROTONIX)  40 MG tablet Take 1 tablet (40 mg total) by mouth daily. 90 tablet 3  . spironolactone (ALDACTONE) 25 MG tablet Take 1 tablet (25 mg total) by mouth daily. 90 tablet 3  . Tiotropium Bromide-Olodaterol (STIOLTO RESPIMAT) 2.5-2.5 MCG/ACT AERS Inhale 2 puffs into the lungs daily. 12 g 3  . TRUE  METRIX BLOOD GLUCOSE TEST test strip 1 each 3 (three) times daily.    . TRUEplus Lancets 33G MISC Apply 1 each topically 3 (three) times daily.    . varenicline (CHANTIX) 0.5 MG tablet Take 1 tablet (0.5 mg total) by mouth 2 (two) times daily. 60 tablet 3  . Vitamin D, Ergocalciferol, (DRISDOL) 1.25 MG (50000 UNIT) CAPS capsule Take 1 capsule (50,000 Units total) by mouth every 7 (seven) days. 5 capsule 6   No current facility-administered medications on file prior to visit.    There are no Patient Instructions on file for this visit. No follow-ups on file.   Kris Hartmann, NP

## 2020-11-23 ENCOUNTER — Ambulatory Visit (INDEPENDENT_AMBULATORY_CARE_PROVIDER_SITE_OTHER): Payer: Medicare Other | Admitting: Nurse Practitioner

## 2020-11-23 ENCOUNTER — Other Ambulatory Visit: Payer: Self-pay

## 2020-11-23 ENCOUNTER — Other Ambulatory Visit: Payer: Self-pay | Admitting: Family Medicine

## 2020-11-23 ENCOUNTER — Encounter (INDEPENDENT_AMBULATORY_CARE_PROVIDER_SITE_OTHER): Payer: Self-pay

## 2020-11-23 VITALS — BP 160/96 | HR 99 | Resp 16

## 2020-11-23 DIAGNOSIS — L97201 Non-pressure chronic ulcer of unspecified calf limited to breakdown of skin: Secondary | ICD-10-CM | POA: Diagnosis not present

## 2020-11-23 DIAGNOSIS — E1165 Type 2 diabetes mellitus with hyperglycemia: Secondary | ICD-10-CM

## 2020-11-23 DIAGNOSIS — Z794 Long term (current) use of insulin: Secondary | ICD-10-CM

## 2020-11-23 MED ORDER — DEXCOM G6 TRANSMITTER MISC: 6 refills | Status: DC

## 2020-11-23 MED ORDER — DEXCOM G6 SENSOR MISC: 3 refills | Status: DC

## 2020-11-23 MED ORDER — FREESTYLE LIBRE 14 DAY READER DEVI
1.0000 | Freq: Three times a day (TID) | 6 refills | Status: DC
Start: 2020-11-23 — End: 2020-11-23

## 2020-11-23 MED ORDER — FREESTYLE LIBRE 14 DAY SENSOR MISC
1.0000 | Freq: Three times a day (TID) | 6 refills | Status: DC
Start: 2020-11-23 — End: 2020-11-23

## 2020-11-23 MED ORDER — DEXCOM G6 TRANSMITTER MISC: 3 refills | Status: DC

## 2020-11-23 MED ORDER — DEXCOM G6 RECEIVER DEVI: 1 refills | Status: DC

## 2020-11-23 MED ORDER — DEXCOM G6 RECEIVER DEVI
6 refills | Status: DC
Start: 2020-11-23 — End: 2020-11-23

## 2020-11-23 NOTE — Progress Notes (Signed)
History of Present Illness  There is no documented history at this time  Assessments & Plan   There are no diagnoses linked to this encounter.    Additional instructions  Subjective:  Patient presents with venous ulcer of the Bilateral lower extremity.    Procedure:  3 layer unna wrap was placed Bilateral lower extremity.   Plan:   Follow up in one week.  

## 2020-11-24 DIAGNOSIS — G4733 Obstructive sleep apnea (adult) (pediatric): Secondary | ICD-10-CM | POA: Diagnosis not present

## 2020-11-30 ENCOUNTER — Ambulatory Visit (INDEPENDENT_AMBULATORY_CARE_PROVIDER_SITE_OTHER): Payer: Medicare Other | Admitting: Nurse Practitioner

## 2020-11-30 ENCOUNTER — Other Ambulatory Visit: Payer: Self-pay

## 2020-11-30 ENCOUNTER — Encounter (INDEPENDENT_AMBULATORY_CARE_PROVIDER_SITE_OTHER): Payer: Self-pay

## 2020-11-30 VITALS — BP 172/91 | HR 96 | Resp 16

## 2020-11-30 DIAGNOSIS — L97201 Non-pressure chronic ulcer of unspecified calf limited to breakdown of skin: Secondary | ICD-10-CM | POA: Diagnosis not present

## 2020-11-30 NOTE — Progress Notes (Signed)
History of Present Illness  There is no documented history at this time  Assessments & Plan   There are no diagnoses linked to this encounter.    Additional instructions  Subjective:  Patient presents with venous ulcer of the Bilateral lower extremity.    Procedure:  3 layer unna wrap was placed Bilateral lower extremity.   Plan:   Follow up in one week.  

## 2020-12-04 ENCOUNTER — Encounter (INDEPENDENT_AMBULATORY_CARE_PROVIDER_SITE_OTHER): Payer: Self-pay | Admitting: Nurse Practitioner

## 2020-12-07 ENCOUNTER — Ambulatory Visit (INDEPENDENT_AMBULATORY_CARE_PROVIDER_SITE_OTHER): Payer: Medicare Other | Admitting: Nurse Practitioner

## 2020-12-08 DIAGNOSIS — K429 Umbilical hernia without obstruction or gangrene: Secondary | ICD-10-CM | POA: Diagnosis not present

## 2020-12-08 DIAGNOSIS — G4733 Obstructive sleep apnea (adult) (pediatric): Secondary | ICD-10-CM | POA: Diagnosis not present

## 2020-12-08 DIAGNOSIS — J449 Chronic obstructive pulmonary disease, unspecified: Secondary | ICD-10-CM | POA: Diagnosis not present

## 2020-12-09 ENCOUNTER — Ambulatory Visit: Payer: Medicare Other | Admitting: Endocrinology

## 2020-12-09 ENCOUNTER — Encounter: Payer: Self-pay | Admitting: *Deleted

## 2020-12-09 DIAGNOSIS — G4733 Obstructive sleep apnea (adult) (pediatric): Secondary | ICD-10-CM | POA: Diagnosis not present

## 2020-12-09 DIAGNOSIS — J449 Chronic obstructive pulmonary disease, unspecified: Secondary | ICD-10-CM | POA: Diagnosis not present

## 2020-12-10 ENCOUNTER — Telehealth (INDEPENDENT_AMBULATORY_CARE_PROVIDER_SITE_OTHER): Payer: Self-pay

## 2020-12-10 NOTE — Telephone Encounter (Signed)
Pt called and left a VM on the nurses line to call back I called pt and got no answer.

## 2020-12-13 ENCOUNTER — Encounter (INDEPENDENT_AMBULATORY_CARE_PROVIDER_SITE_OTHER): Payer: Self-pay | Admitting: Nurse Practitioner

## 2020-12-13 ENCOUNTER — Ambulatory Visit (INDEPENDENT_AMBULATORY_CARE_PROVIDER_SITE_OTHER): Payer: Medicare Other | Admitting: Nurse Practitioner

## 2020-12-13 ENCOUNTER — Other Ambulatory Visit (INDEPENDENT_AMBULATORY_CARE_PROVIDER_SITE_OTHER): Payer: Self-pay | Admitting: Nurse Practitioner

## 2020-12-13 ENCOUNTER — Other Ambulatory Visit: Payer: Self-pay

## 2020-12-13 VITALS — BP 160/93 | HR 98 | Resp 16 | Wt 313.0 lb

## 2020-12-13 DIAGNOSIS — L03119 Cellulitis of unspecified part of limb: Secondary | ICD-10-CM | POA: Diagnosis not present

## 2020-12-13 DIAGNOSIS — M79605 Pain in left leg: Secondary | ICD-10-CM

## 2020-12-13 DIAGNOSIS — I5189 Other ill-defined heart diseases: Secondary | ICD-10-CM

## 2020-12-13 DIAGNOSIS — M79604 Pain in right leg: Secondary | ICD-10-CM | POA: Diagnosis not present

## 2020-12-13 DIAGNOSIS — I890 Lymphedema, not elsewhere classified: Secondary | ICD-10-CM | POA: Diagnosis not present

## 2020-12-13 DIAGNOSIS — I10 Essential (primary) hypertension: Secondary | ICD-10-CM | POA: Diagnosis not present

## 2020-12-13 MED ORDER — CEPHALEXIN 500 MG PO CAPS: 500.0000 mg | ORAL_CAPSULE | Freq: Three times a day (TID) | ORAL | 0 refills | Status: DC

## 2020-12-13 MED FILL — CEPHALEXIN 500 MG CAPSULE: 500 | 7 days supply | Qty: 21 | Fill #0

## 2020-12-13 NOTE — Progress Notes (Signed)
Subjective:    Patient ID: Sharon Swanson, female    DOB: 1961/03/27, 60 y.o.   MRN: [id number] Chief Complaint  Patient presents with  . Follow-up    Ultrasound follow up    Sharon Swanson is a 60 y.o. female.  The patient presents today for evaluation after being in wraps for several weeks.  The patient still continues to have significant edema with slight weeping and multiple blisters.  The patient also complains of pain in her lower extremities.  She describes an aching throbbing sensation that gets worse when she is sitting for long periods or when she elevates her legs.  The patient is aware she elevates her legs legs become numb.  The patient does have multiple comorbidities likely exacerbating her edema.  Patient has a history of respiratory failure is currently on supplemental oxygen.  The patient also has a history of heart failure.  She does not have any previous history of DVT or superficial thrombophlebitis.  Her legs are swollen painful to the touch today and there is little improvement from previous follow-up.  Patient notes that she had a blister with weeping on her left lower extremity.  The right lower extremity is somewhat damp due to weeping.  The patient does note that she should have her lymphedema pump soon      Review of Systems  Respiratory: Positive for shortness of breath (With exertion, uses home O2).   Cardiovascular: Positive for leg swelling.  Skin: Positive for wound.  All other systems reviewed and are negative.      Objective:   Physical Exam Vitals reviewed.  HENT:     Head: Normocephalic.  Cardiovascular:     Rate and Rhythm: Regular rhythm.     Comments: Weeping Pulmonary:     Effort: Pulmonary effort is normal.  Musculoskeletal:     Right lower leg: 2+ Edema present.     Left lower leg: 2+ Edema present.  Skin:    General: Skin is warm.     Comments: Weeping  Neurological:     Mental Status: She is alert and oriented to person, place,  and time.  Psychiatric:        Mood and Affect: Mood normal.        Behavior: Behavior normal.        Thought Content: Thought content normal.        Judgment: Judgment normal.     BP (!) 160/93 (BP Location: Right Arm)   Pulse 98   Resp 16   Wt (!) 313 lb (142 kg)   BMI 49.02 kg/m   Past Medical History:  Diagnosis Date  . Abnormal Pap smear of cervix 10/2020  . Acid reflux   . Asthma   . Atypical squamous cells of undetermined significance (ASC-US) on cervical Pap smear 10/2020  . Bilateral lower extremity edema 01/2020  . COPD (chronic obstructive pulmonary disease) ([place])   . CPAP (continuous positive airway pressure) dependence   . Diabetes ([place])   . Diabetes mellitus without complication ([place])    Type II  . Fatty liver 11/08/2011  . Fibroids 11/08/2011  . Hyperglycemia   . Hyperlipidemia 05/2020  . Hypertension   . Microalbuminuria 01/2020  . Obesity (BMI 30-39.9) 11/08/2011  . Osteoarthritis of right acromioclavicular joint   . Pancreatitis 11/08/2011  . Rotator cuff tear, right   . Shortness of breath on exertion 01/2020  . Sleep apnea   . Ventral hernia   . Vitamin D deficiency  05/2020    Social History   Socioeconomic History  . Marital status: Married    Spouse name: Not on file  . Number of children: Not on file  . Years of education: Not on file  . Highest education level: Not on file  Occupational History  . Not on file  Tobacco Use  . Smoking status: Current Every Day Smoker    Packs/day: 0.50    Years: 39.00    Pack years: 19.50    Types: Cigarettes    Start date: 09/25/1980  . Smokeless tobacco: Never Used  . Tobacco comment: pt states she smoke 2 cigerettes a day  Vaping Use  . Vaping Use: Never used  Substance and Sexual Activity  . Alcohol use: No  . Drug use: No  . Sexual activity: Not Currently    Partners: Male  Other Topics Concern  . Not on file  Social History Narrative  . Not on file   Social Determinants of Health    Financial Resource Strain: Not on file  Food Insecurity: Not on file  Transportation Needs: Not on file  Physical Activity: Not on file  Stress: Not on file  Social Connections: Not on file  Intimate Partner Violence: Not on file    Past Surgical History:  Procedure Laterality Date  . ORTHOPEDIC SURGERY     R ankle, tendonitis    Family History  Problem Relation Age of Onset  . Hypertension Mother   . Diabetes Other   . Diabetes Maternal Grandmother   . Hypertension Maternal Grandmother     Allergies  Allergen Reactions  . Aspirin Other (See Comments)    Abdominal pain    CBC Latest Ref Rng & Units 10/04/2020 08/29/2020 05/28/2020  WBC 3.4 - 10.8 x10E3/uL 4.5 5.5 4.5  Hemoglobin 11.1 - 15.9 g/dL 14.6 14.1 14.0  Hematocrit 34.0 - 46.6 % 43.5 44.3 43.0  Platelets 150 - 450 x10E3/uL 195 184 181      CMP     Component Value Date/Time   NA 139 10/04/2020 1019   K 3.6 10/04/2020 1019   CL 94 (L) 10/04/2020 1019   CO2 29 10/04/2020 1019   GLUCOSE 284 (H) 10/04/2020 1019   GLUCOSE 541 (HH) 08/29/2020 0816   BUN 10 10/04/2020 1019   CREATININE 0.67 10/04/2020 1019   CALCIUM 9.5 10/04/2020 1019   PROT 6.9 10/04/2020 1019   ALBUMIN 4.1 10/04/2020 1019   AST 15 10/04/2020 1019   ALT 18 10/04/2020 1019   ALKPHOS 89 10/04/2020 1019   BILITOT 0.5 10/04/2020 1019   GFRNONAA 97 10/04/2020 1019   GFRNONAA >60 08/29/2020 0816   GFRAA 111 10/04/2020 1019     No results found.     Assessment & Plan:   1. Cellulitis of lower extremity, unspecified laterality The patient has had blisters despite being in Unna wraps as prescribed.  Due to the open wound and risk for cellulitis and infection we will place the patient on antibiotics. - cephALEXin (KEFLEX) 500 MG capsule; Take 1 capsule (500 mg total) by mouth 3 (three) times daily.  Dispense: 21 capsule; Refill: 0  2. Lymphedema The patient has a lymphedema pump which is scheduled to arrive soon which should also help  with the patient's lower extremity edema.  The patient has remained in [place] wraps but despite this she continues to have some weeping and blisters.  There is concern that the patient is not 100% adherent to conservative therapy as elevation does make  her legs hurt.  We will place the patient in [place] wraps again today.  I also suspect that the patient's lymphedema is multifactorial in cause.  This is despite our conservative efforts there are no notable changes to the patient's lower extremity swelling.  The patient has an upcoming evaluation for her possible hernia surgery of both her pulmonologist and cardiologist.  3. Essential hypertension Continue antihypertensive medications as already ordered, these medications have been reviewed and there are no changes at this time.   4. Diastolic dysfunction This may be also contributing to the patient's weeping and blisters despite wrap usage.  5. Pain in both lower extremities The pain in the patient's legs may be made worse by her edema however I feel that it may be more so related to her back.  This is due to the fact that the symptoms become worse with elevation and she also experiences during such as numbness and extreme sensitivity to touch.  Patient has had a previous history of back injury.  Patient advised to follow with PCP for work-up and evaluation of possible lower back/neurological issues.   Current Outpatient Medications on File Prior to Visit  Medication Sig Dispense Refill  . albuterol (PROAIR HFA) 108 (90 Base) MCG/ACT inhaler Inhale 2 puffs into the lungs every 4 (four) hours as needed for wheezing or shortness of breath. 24 each 3  . Alcohol Swabs (B-D SINGLE USE SWABS REGULAR) PADS SMARTSIG:1 Pledget(s) Topical 3 Times Daily    . atorvastatin (LIPITOR) 20 MG tablet Take 1 tablet (20 mg total) by mouth daily. 90 tablet 3  . Blood Glucose Calibration (TRUE METRIX LEVEL 1) Low SOLN     . Blood Glucose Monitoring Suppl (TRUE METRIX  METER) w/Device KIT USE AS DIRECTED 1 kit 0  . carvedilol (COREG) 6.25 MG tablet Take 1 tablet (6.25 mg total) by mouth 2 (two) times daily. 180 tablet 3  . cetirizine (ZYRTEC) 10 MG tablet Take 1 tablet (10 mg total) by mouth in the morning. 90 tablet 3  . Continuous Blood Gluc Receiver (DEXCOM G6 RECEIVER) DEVI Use as directed 1 each 1  . Continuous Blood Gluc Sensor (DEXCOM G6 SENSOR) MISC Apply as directed 9 each 3  . Continuous Blood Gluc Transmit (DEXCOM G6 TRANSMITTER) MISC Use as directed 6 each 3  . docusate sodium (COLACE) 100 MG capsule Take 1 capsule (100 mg total) by mouth 3 (three) times daily as needed for mild constipation. 270 capsule 3  . ENTRESTO 97-103 MG Take 1 tablet by mouth 2 (two) times daily. 180 tablet 3  . fluticasone (FLONASE) 50 MCG/ACT nasal spray Place 1 spray into both nostrils daily. 30 g 11  . furosemide (LASIX) 40 MG tablet Take 1 tablet (40 mg total) by mouth 2 (two) times daily. 180 tablet 3  . gabapentin (NEURONTIN) 300 MG capsule Take 1 capsule (300 mg total) by mouth 3 (three) times daily. 270 capsule 3  . glipiZIDE (GLUCOTROL) 10 MG tablet Take 1 tablet (10 mg total) by mouth 2 (two) times daily before a meal. 180 tablet 3  . glucose monitoring kit (FREESTYLE) monitoring kit 1 each by Does not apply route as needed for other. 1 each 0  . insulin glargine (LANTUS) 100 UNIT/ML injection Inject 0.6 mLs (60 Units total) into the skin daily. 30 mL 3  . insulin lispro (HUMALOG) 100 UNIT/ML injection Inject 0.3 mLs (30 Units total) into the skin 3 (three) times daily before meals. 90 mL 3  . ipratropium-albuterol (  DUONEB) 0.5-2.5 (3) MG/3ML SOLN Take 3 mLs by nebulization every 6 (six) hours as needed (shortness of breath, wheeze). 360 mL 12  . metFORMIN (GLUCOPHAGE) 1000 MG tablet Take 1 tablet (1,000 mg total) by mouth 2 (two) times daily with a meal. 180 tablet 3  . methocarbamol (ROBAXIN) 500 MG tablet Take 500 mg by mouth 3 (three) times daily.    .  methocarbamol (ROBAXIN) 750 MG tablet Take 1 tablet (750 mg total) by mouth 3 (three) times daily as needed for muscle spasms. 270 tablet 3  . Misc. Devices (BARIATRIC ROLLATOR) MISC 1 Device by Does not apply route daily. 1 each 0  . montelukast (SINGULAIR) 10 MG tablet Take 1 tablet (10 mg total) by mouth at bedtime. 90 tablet 3  . nicotine (NICODERM CQ - DOSED IN MG/24 HOURS) 21 mg/24hr patch Place 1 patch (21 mg total) onto the skin daily. 28 patch 0  . oxyCODONE-acetaminophen (PERCOCET/ROXICET) 5-325 MG tablet Take 1-2 tablets by mouth every 4 (four) hours as needed for severe pain. 15 tablet 0  . pantoprazole (PROTONIX) 40 MG tablet Take 1 tablet (40 mg total) by mouth daily. 90 tablet 3  . spironolactone (ALDACTONE) 25 MG tablet Take 1 tablet (25 mg total) by mouth daily. 90 tablet 3  . Tiotropium Bromide-Olodaterol (STIOLTO RESPIMAT) 2.5-2.5 MCG/ACT AERS Inhale 2 puffs into the lungs daily. 12 g 3  . TRUE METRIX BLOOD GLUCOSE TEST test strip 1 each 3 (three) times daily.    . TRUEplus Lancets 33G MISC Apply 1 each topically 3 (three) times daily.    . varenicline (CHANTIX) 0.5 MG tablet Take 1 tablet (0.5 mg total) by mouth 2 (two) times daily. 60 tablet 3  . Vitamin D, Ergocalciferol, (DRISDOL) 1.25 MG (50000 UNIT) CAPS capsule Take 1 capsule (50,000 Units total) by mouth every 7 (seven) days. 5 capsule 6   No current facility-administered medications on file prior to visit.    There are no Patient Instructions on file for this visit. No follow-ups on file.   Kris Hartmann, NP

## 2020-12-14 ENCOUNTER — Ambulatory Visit (INDEPENDENT_AMBULATORY_CARE_PROVIDER_SITE_OTHER): Payer: Medicare Other | Admitting: Nurse Practitioner

## 2020-12-14 ENCOUNTER — Ambulatory Visit (INDEPENDENT_AMBULATORY_CARE_PROVIDER_SITE_OTHER): Payer: Medicare Other | Admitting: Family Medicine

## 2020-12-14 ENCOUNTER — Other Ambulatory Visit: Payer: Self-pay | Admitting: Family Medicine

## 2020-12-14 ENCOUNTER — Encounter: Payer: Self-pay | Admitting: Family Medicine

## 2020-12-14 VITALS — BP 144/64 | HR 104 | Ht 67.0 in | Wt 322.0 lb

## 2020-12-14 DIAGNOSIS — R0602 Shortness of breath: Secondary | ICD-10-CM | POA: Diagnosis not present

## 2020-12-14 DIAGNOSIS — R7309 Other abnormal glucose: Secondary | ICD-10-CM

## 2020-12-14 DIAGNOSIS — Z09 Encounter for follow-up examination after completed treatment for conditions other than malignant neoplasm: Secondary | ICD-10-CM | POA: Diagnosis not present

## 2020-12-14 DIAGNOSIS — R634 Abnormal weight loss: Secondary | ICD-10-CM

## 2020-12-14 DIAGNOSIS — Z794 Long term (current) use of insulin: Secondary | ICD-10-CM

## 2020-12-14 DIAGNOSIS — J45909 Unspecified asthma, uncomplicated: Secondary | ICD-10-CM | POA: Diagnosis not present

## 2020-12-14 DIAGNOSIS — Z9981 Dependence on supplemental oxygen: Secondary | ICD-10-CM

## 2020-12-14 DIAGNOSIS — E1165 Type 2 diabetes mellitus with hyperglycemia: Secondary | ICD-10-CM

## 2020-12-14 DIAGNOSIS — R600 Localized edema: Secondary | ICD-10-CM

## 2020-12-14 LAB — POCT GLYCOSYLATED HEMOGLOBIN (HGB A1C): Hemoglobin A1C: 9.5 % — AB (ref 4.0–5.6)

## 2020-12-14 MED ORDER — TRULICITY 0.75 MG/0.5ML ~~LOC~~ SOAJ: 0.7500 mg | SUBCUTANEOUS | 11 refills | Status: DC

## 2020-12-14 MED FILL — TRULICITY 0.75 MG/0.5 ML PE: 0.75 | 28 days supply | Qty: 2 | Fill #0

## 2020-12-14 NOTE — Progress Notes (Signed)
Patient [facility]   Established Patient Office Visit  Subjective:  Patient ID: Sharon Swanson, female    DOB: 1961/01/26  Age: 60 y.o. MRN: [id number]  CC:  Chief Complaint  Patient presents with  . Diabetes    HPI Sharon Swanson is a 60 year old female who presents for Follow Up today.    Patient Active Problem List   Diagnosis Date Noted  . Lower limb ulcer, calf ([place]) 10/26/2020  . Swelling of limb 10/26/2020  . Bilateral lower extremity edema 02/25/2020  . Shortness of breath on exertion 02/25/2020  . Dependence on continuous supplemental oxygen 02/25/2020  . Abdominal pain, acute, periumbilical 61/95/0932  . Hemoglobin A1C greater than 9%, indicating poor diabetic control 10/19/2019  . Class 3 severe obesity due to excess calories with serious comorbidity and body mass index (BMI) of 45.0 to 49.9 in adult [facility]) 09/21/2019  . Hemoglobin A1C between 7% and 9% indicating borderline diabetic control 09/21/2019  . Chronic respiratory failure ([place]) 08/02/2019  . Diastolic dysfunction 67/08/4579  . Cellulitis 08/02/2019  . FUO (fever of unknown origin) 01/27/2019  . Pure hypercholesterolemia 03/18/2018  . Cough 07/06/2017  . Centrilobular emphysema ([place]) 05/02/2017  . Insulin-requiring or dependent type II diabetes mellitus ([place]) 01/19/2017  . Essential hypertension 01/19/2017  . Anxiety 01/19/2017  . Chest pain 01/19/2017  . OSA (obstructive sleep apnea) 01/19/2017  . COPD (chronic obstructive pulmonary disease) ([place])   . Cigarette smoker 04/03/2016  . Hx of gastric ulcer 04/03/2016  . Acute respiratory failure ([place]) 08/17/2014  . Umbilical hernia 99/83/3825  . Pulmonary nodule, left 08/17/2014  . Constipation, chronic 08/15/2012  . GERD (gastroesophageal reflux disease) 11/08/2011  . Tobacco abuse 11/08/2011  . Fatty liver 11/08/2011  . BMI 45.0-49.9, adult ([place]) 11/08/2011  . Fibroids 11/08/2011   Current Status: Since  her last office visit, she is doing well with no complaints. Her most recent normal range of preprandial blood glucose levels have been between 120-125. She has seen low range of 78 and high of 135 since his last office visit. She denies fatigue, frequent urination, blurred vision, excessive hunger, excessive thirst, weight gain, weight loss, and poor wound healing. She continues to check her feet regularly. She denies visual changes, chest pain, cough, shortness of breath, heart palpitations, and falls. She has occasional headaches and dizziness with position changes. Denies severe headaches, confusion, seizures, double vision, and blurred vision, nausea and vomiting.   She is currently following up with [facility] for bilateral lower extremity edema.    Her anxiety is moderate r/t her health status, which she has made her initial appointment for Psychiatry. She denies suicidal ideations, homicidal ideations, or auditory hallucinations.  She denies fevers, chills, fatigue, recent infections, weight loss, and night sweats. Denies GI problems such as diarrhea, and constipation. She has no reports of blood in stools, dysuria and hematuria. She is taking all medications as prescribed. She denies pain today.   Past Medical History:  Diagnosis Date  . Abnormal Pap smear of cervix 10/2020  . Acid reflux   . Asthma   . Atypical squamous cells of undetermined significance (ASC-US) on cervical Pap smear 10/2020  . Bilateral lower extremity edema 01/2020  . COPD (chronic obstructive pulmonary disease) ([place])   . CPAP (continuous positive airway pressure) dependence   . Diabetes ([place])   . Diabetes mellitus without complication ([place])    Type II  . Fatty liver 11/08/2011  . Fibroids 11/08/2011  .  Hyperglycemia   . Hyperlipidemia 05/2020  . Hypertension   . Microalbuminuria 01/2020  . Obesity (BMI 30-39.9) 11/08/2011  . Osteoarthritis of right acromioclavicular joint   . Pancreatitis 11/08/2011  .  Rotator cuff tear, right   . Shortness of breath on exertion 01/2020  . Sleep apnea   . Ventral hernia   . Vitamin D deficiency 05/2020    Past Surgical History:  Procedure Laterality Date  . ORTHOPEDIC SURGERY     R ankle, tendonitis    Family History  Problem Relation Age of Onset  . Hypertension Mother   . Diabetes Other   . Diabetes Maternal Grandmother   . Hypertension Maternal Grandmother     Social History   Socioeconomic History  . Marital status: Married    Spouse name: Not on file  . Number of children: Not on file  . Years of education: Not on file  . Highest education level: Not on file  Occupational History  . Not on file  Tobacco Use  . Smoking status: Current Every Day Smoker    Packs/day: 0.50    Years: 39.00    Pack years: 19.50    Types: Cigarettes    Start date: 09/25/1980  . Smokeless tobacco: Never Used  . Tobacco comment: pt states she smoke 2 cigerettes a day  Vaping Use  . Vaping Use: Never used  Substance and Sexual Activity  . Alcohol use: No  . Drug use: No  . Sexual activity: Not Currently    Partners: Male  Other Topics Concern  . Not on file  Social History Narrative  . Not on file   Social Determinants of Health   Financial Resource Strain: Not on file  Food Insecurity: Not on file  Transportation Needs: Not on file  Physical Activity: Not on file  Stress: Not on file  Social Connections: Not on file  Intimate Partner Violence: Not on file    Outpatient Medications Prior to Visit  Medication Sig Dispense Refill  . albuterol (PROAIR HFA) 108 (90 Base) MCG/ACT inhaler Inhale 2 puffs into the lungs every 4 (four) hours as needed for wheezing or shortness of breath. 24 each 3  . Alcohol Swabs (B-D SINGLE USE SWABS REGULAR) PADS SMARTSIG:1 [place](s) Topical 3 Times Daily    . atorvastatin (LIPITOR) 20 MG tablet Take 1 tablet (20 mg total) by mouth daily. 90 tablet 3  . Blood Glucose Calibration (TRUE METRIX LEVEL 1) Low SOLN      . Blood Glucose Monitoring Suppl (TRUE METRIX METER) w/Device KIT USE AS DIRECTED 1 kit 0  . carvedilol (COREG) 6.25 MG tablet Take 1 tablet (6.25 mg total) by mouth 2 (two) times daily. 180 tablet 3  . cephALEXin (KEFLEX) 500 MG capsule Take 1 capsule (500 mg total) by mouth 3 (three) times daily. 21 capsule 0  . cetirizine (ZYRTEC) 10 MG tablet Take 1 tablet (10 mg total) by mouth in the morning. 90 tablet 3  . Continuous Blood Gluc Receiver (DEXCOM G6 RECEIVER) DEVI Use as directed 1 each 1  . Continuous Blood Gluc Sensor (DEXCOM G6 SENSOR) MISC Apply as directed 9 each 3  . Continuous Blood Gluc Transmit (DEXCOM G6 TRANSMITTER) MISC Use as directed 6 each 3  . docusate sodium (COLACE) 100 MG capsule Take 1 capsule (100 mg total) by mouth 3 (three) times daily as needed for mild constipation. 270 capsule 3  . ENTRESTO 97-103 MG Take 1 tablet by mouth 2 (two) times daily. [place]  tablet 3  . fluticasone (FLONASE) 50 MCG/ACT nasal spray Place 1 spray into both nostrils daily. 30 g 11  . furosemide (LASIX) 40 MG tablet Take 1 tablet (40 mg total) by mouth 2 (two) times daily. 180 tablet 3  . gabapentin (NEURONTIN) 300 MG capsule Take 1 capsule (300 mg total) by mouth 3 (three) times daily. 270 capsule 3  . glipiZIDE (GLUCOTROL) 10 MG tablet Take 1 tablet (10 mg total) by mouth 2 (two) times daily before a meal. 180 tablet 3  . glucose monitoring kit (FREESTYLE) monitoring kit 1 each by Does not apply route as needed for other. 1 each 0  . insulin glargine (LANTUS) 100 UNIT/ML injection Inject 0.6 mLs (60 Units total) into the skin daily. 30 mL 3  . insulin lispro (HUMALOG) 100 UNIT/ML injection Inject 0.3 mLs (30 Units total) into the skin 3 (three) times daily before meals. 90 mL 3  . ipratropium-albuterol (DUONEB) 0.5-2.5 (3) MG/3ML SOLN Take 3 mLs by nebulization every 6 (six) hours as needed (shortness of breath, wheeze). 360 mL 12  . metFORMIN (GLUCOPHAGE) 1000 MG tablet Take 1 tablet (1,000  mg total) by mouth 2 (two) times daily with a meal. 180 tablet 3  . methocarbamol (ROBAXIN) 500 MG tablet Take 500 mg by mouth 3 (three) times daily.    . methocarbamol (ROBAXIN) 750 MG tablet Take 1 tablet (750 mg total) by mouth 3 (three) times daily as needed for muscle spasms. 270 tablet 3  . Misc. Devices (BARIATRIC ROLLATOR) MISC 1 Device by Does not apply route daily. 1 each 0  . montelukast (SINGULAIR) 10 MG tablet Take 1 tablet (10 mg total) by mouth at bedtime. 90 tablet 3  . nicotine (NICODERM CQ - DOSED IN MG/24 HOURS) 21 mg/24hr patch Place 1 patch (21 mg total) onto the skin daily. 28 patch 0  . oxyCODONE-acetaminophen (PERCOCET/ROXICET) 5-325 MG tablet Take 1-2 tablets by mouth every 4 (four) hours as needed for severe pain. 15 tablet 0  . pantoprazole (PROTONIX) 40 MG tablet Take 1 tablet (40 mg total) by mouth daily. 90 tablet 3  . spironolactone (ALDACTONE) 25 MG tablet Take 1 tablet (25 mg total) by mouth daily. 90 tablet 3  . Tiotropium Bromide-Olodaterol (STIOLTO RESPIMAT) 2.5-2.5 MCG/ACT AERS Inhale 2 puffs into the lungs daily. 12 g 3  . TRUE METRIX BLOOD GLUCOSE TEST test strip 1 each 3 (three) times daily.    . TRUEplus Lancets 33G MISC Apply 1 each topically 3 (three) times daily.    . varenicline (CHANTIX) 0.5 MG tablet Take 1 tablet (0.5 mg total) by mouth 2 (two) times daily. 60 tablet 3  . Vitamin D, Ergocalciferol, (DRISDOL) 1.25 MG (50000 UNIT) CAPS capsule Take 1 capsule (50,000 Units total) by mouth every 7 (seven) days. 5 capsule 6   No facility-administered medications prior to visit.    Allergies  Allergen Reactions  . Aspirin Other (See Comments)    Abdominal pain    ROS Review of Systems  Constitutional: Negative.   HENT: Negative.   Eyes: Negative.   Respiratory: Positive for shortness of breath (occasional ).   Cardiovascular: Negative.   Gastrointestinal: Positive for abdominal distention (obese).  Endocrine: Negative.   Genitourinary:  Negative.   Musculoskeletal: Positive for arthralgias (generalized joint pain).  Skin: Negative.   Allergic/Immunologic: Negative.   Neurological: Positive for dizziness (occasional), weakness (generalized. ) and headaches (occasional).  Hematological: Negative.   Psychiatric/Behavioral: Negative.       Objective:  Physical Exam Vitals and nursing note reviewed.  Constitutional:      Appearance: Normal appearance. She is obese.  HENT:     Head: Normocephalic and atraumatic.     Nose: Nose normal.     Mouth/Throat:     Mouth: Mucous membranes are moist.     Pharynx: Oropharynx is clear.  Cardiovascular:     Rate and Rhythm: Normal rate and regular rhythm.     Pulses: Normal pulses.     Heart sounds: Normal heart sounds.  Pulmonary:     Effort: Pulmonary effort is normal.     Breath sounds: Wheezing (ascultated in upper lung lobes) present.  Abdominal:     General: Bowel sounds are normal. There is distension (obese).     Palpations: Abdomen is soft.  Musculoskeletal:     Cervical back: Normal range of motion and neck supple.     Right lower leg: Edema present.     Left lower leg: Edema present.     Comments: Boot wraps weekly, per Wound Care.   Skin:    General: Skin is warm and dry.  Neurological:     General: No focal deficit present.     Mental Status: She is alert and oriented to person, place, and time.  Psychiatric:        Mood and Affect: Mood normal.        Behavior: Behavior normal.        Thought Content: Thought content normal.        Judgment: Judgment normal.     BP (!) 144/64   Pulse (!) 104   Ht 5' 7"  (1.702 m)   Wt (!) 322 lb (146.1 kg)   SpO2 100% Comment: 3L O2  BMI 50.43 kg/m  Wt Readings from Last 3 Encounters:  12/14/20 (!) 322 lb (146.1 kg)  12/13/20 (!) 313 lb (142 kg)  11/17/20 (!) 313 lb (142 kg)     Health Maintenance Due  Topic Date Due  . MAMMOGRAM  Never done    There are no preventive care reminders to display for  this patient.  Lab Results  Component Value Date   TSH 1.060 10/04/2020   Lab Results  Component Value Date   WBC 4.5 10/04/2020   HGB 14.6 10/04/2020   HCT 43.5 10/04/2020   MCV 86 10/04/2020   PLT 195 10/04/2020   Lab Results  Component Value Date   NA 139 10/04/2020   K 3.6 10/04/2020   CO2 29 10/04/2020   GLUCOSE 284 (H) 10/04/2020   BUN 10 10/04/2020   CREATININE 0.67 10/04/2020   BILITOT 0.5 10/04/2020   ALKPHOS 89 10/04/2020   AST 15 10/04/2020   ALT 18 10/04/2020   PROT 6.9 10/04/2020   ALBUMIN 4.1 10/04/2020   CALCIUM 9.5 10/04/2020   ANIONGAP 9 08/29/2020   GFR 128.72 03/01/2020   Lab Results  Component Value Date   CHOL 171 10/04/2020   Lab Results  Component Value Date   HDL 60 10/04/2020   Lab Results  Component Value Date   LDLCALC 90 10/04/2020   Lab Results  Component Value Date   TRIG 121 10/04/2020   Lab Results  Component Value Date   CHOLHDL 2.9 10/04/2020   Lab Results  Component Value Date   HGBA1C 9.5 (A) 12/14/2020    Assessment & Plan:   1. Type 2 diabetes mellitus with hyperglycemia, with long-term current use of insulin ([place]) She will continue medication as prescribed, to  decrease foods/beverages high in sugars and carbs and follow Heart Healthy or DASH diet. Increase physical activity to at least 30 minutes cardio exercise daily.  - POCT glycosylated hemoglobin (Hb A1C) - Dulaglutide (TRULICITY) 6.23 JS/2.8BT SOPN; Inject 0.75 mg into the skin once a week.  Dispense: 0.5 mL; Refill: 11  2. Hemoglobin A1C greater than 9%, indicating poor diabetic control We will initiate Dulaglutide today.  - Dulaglutide (TRULICITY) 5.17 OH/6.0VP SOPN; Inject 0.75 mg into the skin once a week.  Dispense: 0.5 mL; Refill: 11  3. Moderate asthma without complication, unspecified whether persistent Stable No signs or symptoms of respiratory distress noted or reported today.  4. Bilateral lower extremity edema Improved. She will continue  Diuretics and [facility] 'boot wraps' weekly as needed.   5. Shortness of breath on exertion Stable. No signs or symptoms of respiratory distress noted or reported today.   6. Dependence on continuous supplemental oxygen Currently on 2-3 liters of continuous oxygen.  7. Weight loss - Dulaglutide (TRULICITY) 7.10 GY/6.9SW SOPN; Inject 0.75 mg into the skin once a week.  Dispense: 0.5 mL; Refill: 11  8. Follow up She will follow up in 3 months.    Meds ordered this encounter  Medications  . Dulaglutide (TRULICITY) 5.46 EV/0.3JK SOPN    Sig: Inject 0.75 mg into the skin once a week.    Dispense:  0.5 mL    Refill:  11    Orders Placed This Encounter  Procedures  . POCT glycosylated hemoglobin (Hb A1C)    Referral Orders  No referral(s) requested today    Kathe Becton, MSN, ANE, FNP-BC [facility], [place] [address] [phone] fax   Problem List Items Addressed This Visit      Endocrine   Hemoglobin A1C greater than 9%, indicating poor diabetic control   Relevant Medications   Dulaglutide (TRULICITY) 1.02 HE/5.2DP SOPN     Other   Bilateral lower extremity edema   Dependence on continuous supplemental oxygen   Shortness of breath on exertion    Other Visit Diagnoses    Type 2 diabetes mellitus with hyperglycemia, with long-term current use of insulin ([facility])    -  Primary   Relevant Medications   Dulaglutide (TRULICITY) 8.24 MP/5.3IR SOPN   Other Relevant Orders   POCT glycosylated hemoglobin (Hb A1C) (Completed)   Moderate asthma without complication, unspecified whether persistent       Weight loss       Relevant Medications   Dulaglutide (TRULICITY) 4.43 XV/4.0GQ SOPN   Follow up          Meds ordered this encounter  Medications  . Dulaglutide (TRULICITY) 6.76 PP/5.0DT SOPN    Sig: Inject 0.75 mg into the skin once a week.     Dispense:  0.5 mL    Refill:  11    Follow-up: No follow-ups on file.    Azzie Glatter, FNP

## 2020-12-14 NOTE — Patient Instructions (Addendum)
Please contact your Opthalmaologist and schedule a Diabetic Eye Exam.    Calorie Counting for Weight Loss Calories are units of energy. Your body needs a certain number of calories from food to keep going throughout the day. When you eat or drink more calories than your body needs, your body stores the extra calories mostly as fat. When you eat or drink fewer calories than your body needs, your body burns fat to get the energy it needs. Calorie counting means keeping track of how many calories you eat and drink each day. Calorie counting can be helpful if you need to lose weight. If you eat fewer calories than your body needs, you should lose weight. Ask your health care provider what a healthy weight is for you. For calorie counting to work, you will need to eat the right number of calories each day to lose a healthy amount of weight per week. A dietitian can help you figure out how many calories you need in a day and will suggest ways to reach your calorie goal.  A healthy amount of weight to lose each week is usually 1-2 lb (0.5-0.9 kg). This usually means that your daily calorie intake should be reduced by 500-750 calories.  Eating 1,200-1,500 calories a day can help most women lose weight.  Eating 1,500-1,800 calories a day can help most men lose weight. What do I need to know about calorie counting? Work with your health care provider or dietitian to determine how many calories you should get each day. To meet your daily calorie goal, you will need to:  Find out how many calories are in each food that you would like to eat. Try to do this before you eat.  Decide how much of the food you plan to eat.  Keep a food log. Do this by writing down what you ate and how many calories it had. To successfully lose weight, it is important to balance calorie counting with a healthy lifestyle that includes regular activity. Where do I find calorie information? The number of calories in a food can be  found on a Nutrition Facts label. If a food does not have a Nutrition Facts label, try to look up the calories online or ask your dietitian for help. Remember that calories are listed per serving. If you choose to have more than one serving of a food, you will have to multiply the calories per serving by the number of servings you plan to eat. For example, the label on a package of bread might say that a serving size is 1 slice and that there are 90 calories in a serving. If you eat 1 slice, you will have eaten 90 calories. If you eat 2 slices, you will have eaten 180 calories.   How do I keep a food log? After each time that you eat, record the following in your food log as soon as possible:  What you ate. Be sure to include toppings, sauces, and other extras on the food.  How much you ate. This can be measured in cups, ounces, or number of items.  How many calories were in each food and drink.  The total number of calories in the food you ate. Keep your food log near you, such as in a pocket-sized notebook or on an app or website on your mobile phone. Some programs will calculate calories for you and show you how many calories you have left to meet your daily goal. What are some  portion-control tips?  Know how many calories are in a serving. This will help you know how many servings you can have of a certain food.  Use a measuring cup to measure serving sizes. You could also try weighing out portions on a kitchen scale. With time, you will be able to estimate serving sizes for some foods.  Take time to put servings of different foods on your favorite plates or in your favorite bowls and cups so you know what a serving looks like.  Try not to eat straight from a food's packaging, such as from a bag or box. Eating straight from the package makes it hard to see how much you are eating and can lead to overeating. Put the amount you would like to eat in a cup or on a plate to make sure you are  eating the right portion.  Use smaller plates, glasses, and bowls for smaller portions and to prevent overeating.  Try not to multitask. For example, avoid watching TV or using your computer while eating. If it is time to eat, sit down at a table and enjoy your food. This will help you recognize when you are full. It will also help you be more mindful of what and how much you are eating. What are tips for following this plan? Reading food labels  Check the calorie count compared with the serving size. The serving size may be smaller than what you are used to eating.  Check the source of the calories. Try to choose foods that are high in protein, fiber, and vitamins, and low in saturated fat, trans fat, and sodium. Shopping  Read nutrition labels while you shop. This will help you make healthy decisions about which foods to buy.  Pay attention to nutrition labels for low-fat or fat-free foods. These foods sometimes have the same number of calories or more calories than the full-fat versions. They also often have added sugar, starch, or salt to make up for flavor that was removed with the fat.  Make a grocery list of lower-calorie foods and stick to it. Cooking  Try to cook your favorite foods in a healthier way. For example, try baking instead of frying.  Use low-fat dairy products. Meal planning  Use more fruits and vegetables. One-half of your plate should be fruits and vegetables.  Include lean proteins, such as chicken, [place], and fish. Lifestyle Each week, aim to do one of the following:  150 minutes of moderate exercise, such as walking.  75 minutes of vigorous exercise, such as running. General information  Know how many calories are in the foods you eat most often. This will help you calculate calorie counts faster.  Find a way of tracking calories that works for you. Get creative. Try different apps or programs if writing down calories does not work for you. What foods  should I eat?  Eat nutritious foods. It is better to have a nutritious, high-calorie food, such as an avocado, than a food with few nutrients, such as a bag of potato chips.  Use your calories on foods and drinks that will fill you up and will not leave you hungry soon after eating. ? Examples of foods that fill you up are nuts and nut butters, vegetables, lean proteins, and high-fiber foods such as whole grains. High-fiber foods are foods with more than 5 g of fiber per serving.  Pay attention to calories in drinks. Low-calorie drinks include water and unsweetened drinks. The items listed above  may not be a complete list of foods and beverages you can eat. Contact a dietitian for more information.   What foods should I limit? Limit foods or drinks that are not good sources of vitamins, minerals, or protein or that are high in unhealthy fats. These include:  Candy.  Other sweets.  Sodas, specialty coffee drinks, alcohol, and juice. The items listed above may not be a complete list of foods and beverages you should avoid. Contact a dietitian for more information. How do I count calories when eating out?  Pay attention to portions. Often, portions are much larger when eating out. Try these tips to keep portions smaller: ? Consider sharing a meal instead of getting your own. ? If you get your own meal, eat only half of it. Before you start eating, ask for a container and put half of your meal into it. ? When available, consider ordering smaller portions from the menu instead of full portions.  Pay attention to your food and drink choices. Knowing the way food is cooked and what is included with the meal can help you eat fewer calories. ? If calories are listed on the menu, choose the lower-calorie options. ? Choose dishes that include vegetables, fruits, whole grains, low-fat dairy products, and lean proteins. ? Choose items that are boiled, broiled, grilled, or steamed. Avoid items that are  buttered, battered, fried, or served with cream sauce. Items labeled as crispy are usually fried, unless stated otherwise. ? Choose water, low-fat milk, unsweetened iced tea, or other drinks without added sugar. If you want an alcoholic beverage, choose a lower-calorie option, such as a glass of wine or light beer. ? Ask for dressings, sauces, and syrups on the side. These are usually high in calories, so you should limit the amount you eat. ? If you want a salad, choose a garden salad and ask for grilled meats. Avoid extra toppings such as bacon, cheese, or fried items. Ask for the dressing on the side, or ask for olive oil and vinegar or lemon to use as dressing.  Estimate how many servings of a food you are given. Knowing serving sizes will help you be aware of how much food you are eating at restaurants. Where to find more information  Centers for Disease Control and Prevention: http://www.wolf.info/  [place] Department of Agriculture: http://www.wilson-mendoza.org/ Summary  Calorie counting means keeping track of how many calories you eat and drink each day. If you eat fewer calories than your body needs, you should lose weight.  A healthy amount of weight to lose per week is usually 1-2 lb (0.5-0.9 kg). This usually means reducing your daily calorie intake by 500-750 calories.  The number of calories in a food can be found on a Nutrition Facts label. If a food does not have a Nutrition Facts label, try to look up the calories online or ask your dietitian for help.  Use smaller plates, glasses, and bowls for smaller portions and to prevent overeating.  Use your calories on foods and drinks that will fill you up and not leave you hungry shortly after a meal. This information is not intended to replace advice given to you by your health care provider. Make sure you discuss any questions you have with your health care provider. Document Revised: 10/23/2019 Document Reviewed: 10/23/2019 Elsevier Patient Education  2021  [place] Injection What is this medicine? DULAGLUTIDE (DOO la GLOO tide) controls blood sugar in people with type 2 diabetes. It is used  with lifestyle changes like diet and exercise. It may lower the risk of problems that need treatment in the hospital. These problems include heart attack or stroke. This medicine may be used for other purposes; ask your health care provider or pharmacist if you have questions. COMMON BRAND NAME(S): Trulicity What should I tell my health care provider before I take this medicine? They need to know if you have any of these conditions:  endocrine tumors (MEN 2) or if someone in your family had these tumors  eye disease, vision problems  history of pancreatitis  kidney disease  liver disease  stomach or intestine problems  thyroid cancer or if someone in your family had thyroid cancer  an unusual or allergic reaction to dulaglutide, other medicines, foods, dyes, or preservatives  pregnant or trying to get pregnant  breast-feeding How should I use this medicine? This medicine is injected under the skin. You will be taught how to prepare and give it. Take it as directed on the prescription label on the same day of each week. Do NOT prime the pen. Keep taking it unless your health care provider tells you to stop. If you use this medicine with insulin, you should inject this medicine and the insulin separately. Do not mix them together. Do not give the injections right next to each other. Change (rotate) injection sites with each injection. This drug comes with INSTRUCTIONS FOR USE. Ask your pharmacist for directions on how to use this medicine. Read the information carefully. Talk to your pharmacist or health care provider if you have questions. It is important that you put your used needles and syringes in a special sharps container. Do not put them in a trash can. If you do not have a sharps container, call your pharmacist or health care  provider to get one. A special MedGuide will be given to you by the pharmacist with each prescription and refill. Be sure to read this information carefully each time. Talk to your health care provider about the use of this medicine in children. Special care may be needed. Overdosage: If you think you have taken too much of this medicine contact a poison control center or emergency room at once. NOTE: This medicine is only for you. Do not share this medicine with others. What if I miss a dose? If you miss a dose, take it as soon as you can unless it is more than 3 days late. If it is more than 3 days late, skip the missed dose. Take the next dose at the normal time. What may interact with this medicine?  other medicines for diabetes Many medications may cause changes in blood sugar, these include:  alcohol containing beverages  antiviral medicines for HIV or AIDS  aspirin and aspirin-like drugs  certain medicines for blood pressure, heart disease, irregular heart beat  chromium  diuretics  female hormones, such as estrogens or progestins, birth control pills  fenofibrate  gemfibrozil  isoniazid  lanreotide  female hormones or anabolic steroids  MAOIs like Carbex, Eldepryl, Marplan, Nardil, and Parnate  medicines for allergies, asthma, cold, or cough  medicines for depression, anxiety, or psychotic disturbances  medicines for weight loss  niacin  nicotine  NSAIDs, medicines for pain and inflammation, like ibuprofen or naproxen  octreotide  pasireotide  pentamidine  phenytoin  probenecid  quinolone antibiotics such as ciprofloxacin, levofloxacin, ofloxacin  some herbal dietary supplements  steroid medicines such as prednisone or cortisone  sulfamethoxazole; trimethoprim  thyroid hormones Some medications  can hide the warning symptoms of low blood sugar (hypoglycemia). You may need to monitor your blood sugar more closely if you are taking one of these  medications. These include:  beta-blockers, often used for high blood pressure or heart problems (examples include atenolol, metoprolol, propranolol)  clonidine  guanethidine  reserpine This list may not describe all possible interactions. Give your health care provider a list of all the medicines, herbs, non-prescription drugs, or dietary supplements you use. Also tell them if you smoke, drink alcohol, or use illegal drugs. Some items may interact with your medicine. What should I watch for while using this medicine? Visit your health care provider for regular checks on your progress. Check with your health care provider if you have severe diarrhea, nausea, and vomiting, or if you sweat a lot. The loss of too much body fluid may make it dangerous for you to take this medicine. A test called the HbA1C (A1C) will be monitored. This is a simple blood test. It measures your blood sugar control over the last 2 to 3 months. You will receive this test every 3 to 6 months. Learn how to check your blood sugar. Learn the symptoms of low and high blood sugar and how to manage them. Always carry a quick-source of sugar with you in case you have symptoms of low blood sugar. Examples include hard sugar candy or glucose tablets. Make sure others know that you can choke if you eat or drink when you develop serious symptoms of low blood sugar, such as seizures or unconsciousness. Get medical help at once. Tell your health care provider if you have high blood sugar. You might need to change the dose of your medicine. If you are sick or exercising more than usual, you may need to change the dose of your medicine. Do not skip meals. Ask your health care provider if you should avoid alcohol. Many nonprescription cough and cold products contain sugar or alcohol. These can affect blood sugar. Pens should never be shared. Even if the needle is changed, sharing may result in passing of viruses like hepatitis or HIV. Wear  a medical ID bracelet or chain. Carry a card that describes your condition. List the medicines and doses you take on the card. What side effects may I notice from receiving this medicine? Side effects that you should report to your doctor or health care professional as soon as possible:  allergic reactions (skin rash, itching or hives; swelling of the face, lips, or tongue)  changes in vision  diarrhea that continues or is severe  infection (fever, chills, cough, sore throat, pain or trouble passing urine)  kidney injury (trouble passing urine or change in the amount of urine)  low blood sugar (feeling anxious; confusion; dizziness; increased hunger; unusually weak or tired; increased sweating; shakiness; cold, clammy skin; irritable; headache; blurred vision; fast heartbeat; loss of consciousness)  lump or swelling on the neck  trouble breathing  trouble swallowing  unusual stomach upset or pain  vomiting Side effects that usually do not require medical attention (report to your doctor or health care professional if they continue or are bothersome):  lack or loss of appetite  nausea  pain, redness, or irritation at site where injected This list may not describe all possible side effects. Call your doctor for medical advice about side effects. You may report side effects to FDA at 1-800-FDA-1088. Where should I keep my medicine? Keep out of the reach of children and pets. Refrigeration (preferred):  Store unopened pens in a refrigerator between 2 and 8 degrees C (36 and 46 degrees F). Keep it in the original carton until you are ready to take it. Do not freeze or use if the medicine has been frozen. Protect from light. Get rid of any unused medicine after the expiration date on the label. Room Temperature: The pen may be stored at room temperature below 30 degrees C (86 degrees F) for up to a total of 14 days if needed. Protect from light. Avoid exposure to extreme heat. If it is  stored at room temperature, throw away any unused medicine after 14 days or after it expires, whichever is first. To get rid of medicines that are no longer needed or have expired:  Take the medicine to a medicine take-back program. Check with your pharmacy or law enforcement to find a location.  If you cannot return the medicine, ask your pharmacist or health care provider how to get rid of this medicine safely. NOTE: This sheet is a summary. It may not cover all possible information. If you have questions about this medicine, talk to your doctor, pharmacist, or health care provider.  2021 Elsevier/Gold Standard (2020-07-12 07:35:51)

## 2020-12-15 ENCOUNTER — Telehealth: Payer: Self-pay

## 2020-12-20 ENCOUNTER — Encounter: Payer: Self-pay | Admitting: Family Medicine

## 2020-12-21 ENCOUNTER — Encounter (INDEPENDENT_AMBULATORY_CARE_PROVIDER_SITE_OTHER): Payer: Medicare Other

## 2020-12-23 MED FILL — TRULICITY 0.75 MG/0.5 ML PE: 0.75 | 28 days supply | Qty: 2 | Fill #0

## 2020-12-23 MED FILL — CEPHALEXIN 500 MG CAPSULE: 500 | 7 days supply | Qty: 21 | Fill #0

## 2020-12-23 MED FILL — NovoLOG 100 UNIT/ML SOLN: 100 | 33 days supply | Qty: 20 | Fill #3

## 2020-12-25 ENCOUNTER — Other Ambulatory Visit: Payer: Self-pay

## 2020-12-27 ENCOUNTER — Other Ambulatory Visit: Payer: Self-pay | Admitting: Family Medicine

## 2020-12-27 ENCOUNTER — Telehealth: Payer: Self-pay

## 2020-12-27 DIAGNOSIS — R0602 Shortness of breath: Secondary | ICD-10-CM

## 2020-12-27 DIAGNOSIS — R062 Wheezing: Secondary | ICD-10-CM

## 2020-12-27 DIAGNOSIS — J45909 Unspecified asthma, uncomplicated: Secondary | ICD-10-CM

## 2020-12-27 NOTE — Telephone Encounter (Signed)
Patient would like an MRI for her back to determine why she is having swelling.  She says vein and vascular suggested this.  Phone number given to patient to contact cardiology.

## 2020-12-27 NOTE — Telephone Encounter (Signed)
HAS SOME QUESTION FOR PROVIDER 2 REFERRALS CARDIOLOGIST FOR APPT MRI ON HER BACK

## 2020-12-28 ENCOUNTER — Other Ambulatory Visit: Payer: Self-pay

## 2020-12-28 ENCOUNTER — Encounter (INDEPENDENT_AMBULATORY_CARE_PROVIDER_SITE_OTHER): Payer: Self-pay | Admitting: Nurse Practitioner

## 2020-12-28 ENCOUNTER — Ambulatory Visit (INDEPENDENT_AMBULATORY_CARE_PROVIDER_SITE_OTHER): Payer: Medicare Other | Admitting: Nurse Practitioner

## 2020-12-28 VITALS — BP 153/86 | HR 99 | Ht 67.0 in | Wt 318.0 lb

## 2020-12-28 DIAGNOSIS — L97201 Non-pressure chronic ulcer of unspecified calf limited to breakdown of skin: Secondary | ICD-10-CM

## 2020-12-28 NOTE — Progress Notes (Signed)
History of Present Illness  There is no documented history at this time  Assessments & Plan   There are no diagnoses linked to this encounter.    Additional instructions  Subjective:  Patient presents with venous ulcer of the Bilateral lower extremity.    Procedure:  3 layer unna wrap was placed Bilateral lower extremity.   Plan:   Follow up in one week.  

## 2020-12-29 ENCOUNTER — Encounter: Payer: Self-pay | Admitting: Family Medicine

## 2020-12-30 ENCOUNTER — Ambulatory Visit (INDEPENDENT_AMBULATORY_CARE_PROVIDER_SITE_OTHER): Payer: Medicare Other | Admitting: Pulmonary Disease

## 2020-12-30 ENCOUNTER — Ambulatory Visit (INDEPENDENT_AMBULATORY_CARE_PROVIDER_SITE_OTHER): Payer: Medicare Other

## 2020-12-30 ENCOUNTER — Other Ambulatory Visit: Payer: Self-pay

## 2020-12-30 ENCOUNTER — Encounter: Payer: Self-pay | Admitting: Pulmonary Disease

## 2020-12-30 VITALS — BP 130/78 | HR 101 | Temp 97.5°F | Ht 67.0 in | Wt 316.6 lb

## 2020-12-30 DIAGNOSIS — J449 Chronic obstructive pulmonary disease, unspecified: Secondary | ICD-10-CM | POA: Diagnosis not present

## 2020-12-30 MED ORDER — IPRATROPIUM-ALBUTEROL 0.5-2.5 (3) MG/3ML IN SOLN
3.0000 mL | RESPIRATORY_TRACT | 5 refills | Status: DC | PRN
  Filled 2020-12-30: qty 360, 20d supply, fill #0

## 2020-12-30 MED ORDER — PREDNISONE 20 MG PO TABS
ORAL_TABLET | ORAL | 0 refills | Status: DC
  Filled 2020-12-30: qty 10, 5d supply, fill #0

## 2020-12-30 NOTE — Patient Instructions (Addendum)
Continue the stiolto Give prednisone 40 mg a day for 5 days Prescribed duo nebs 2-3 times a day as needed Make sure that he uses CPAP daily Work on smoking cessation Get chest x-ray today  Refer you to one of our sleep apnea specialist to talk about alternatives to CPAP  Follow-up in 3 months.

## 2020-12-30 NOTE — Progress Notes (Signed)
Sharon Swanson    [id number]    Jan 17, 1961  Primary Care Physician:Stroud, Ellie Lunch, FNP  Referring Physician: Azzie Glatter, FNP [place],  [place] [id number]  Problem list: Follow up for COPD OSA Diastolic heart failure  Obesity Active smoker  HPI: 60 y/o morbidly obese female, smoker (1/2 ppd for 35 years, began at age 61) with PMH of HTN, HLD, DM, fatty liver, fibroids, pancreatitis, ventral hernia childhood asthma & GERD who was admitted to [facility] on 06/04/16 with RLL CAP, COPD exacerbation.  Interim History: She has been intermittently seen in clinic for the past few years No on supplemental oxygen, continues to smoke Remains on stiolto inhaler.  Has dyspnea on exertion, occasional wheezing.  Denies any cough, congestion She is being evaluated by Dr. Zenia Resides for symptomatic ventral hernia.  She will need surgery to correct this and is here for preop evaluation  Outpatient Encounter Medications as of 12/30/2020  Medication Sig  . albuterol (VENTOLIN HFA) 108 (90 Base) MCG/ACT inhaler USE 2 INHALATIONS BY MOUTH  INTO THE LUNGS EVERY 4  HOURS AS NEEDED FOR  WHEEZING OR SHORTNESS OF  BREATH  . Alcohol Swabs (B-D SINGLE USE SWABS REGULAR) PADS SMARTSIG:1 Pledget(s) Topical 3 Times Daily  . atorvastatin (LIPITOR) 20 MG tablet Take 1 tablet (20 mg total) by mouth daily.  . Blood Glucose Calibration (TRUE METRIX LEVEL 1) Low SOLN   . Blood Glucose Monitoring Suppl (TRUE METRIX METER) w/Device KIT USE AS DIRECTED  . carvedilol (COREG) 6.25 MG tablet Take 1 tablet (6.25 mg total) by mouth 2 (two) times daily.  . cephALEXin (KEFLEX) 500 MG capsule TAKE 1 CAPSULE (500 MG TOTAL) BY MOUTH 3 (THREE) TIMES DAILY.  . cetirizine (ZYRTEC) 10 MG tablet Take 1 tablet (10 mg total) by mouth in the morning.  . Continuous Blood Gluc Receiver (DEXCOM G6 RECEIVER) DEVI Use as directed  . Continuous Blood Gluc Sensor (DEXCOM G6 SENSOR) MISC Apply as directed  . Continuous  Blood Gluc Transmit (DEXCOM G6 TRANSMITTER) MISC Use as directed  . docusate sodium (COLACE) 100 MG capsule Take 1 capsule (100 mg total) by mouth 3 (three) times daily as needed for mild constipation.  . Dulaglutide 0.75 MG/0.5ML SOPN INJECT 0.75 MG INTO THE SKIN ONCE A WEEK.  Marland Kitchen ENTRESTO 97-103 MG Take 1 tablet by mouth 2 (two) times daily.  . fluticasone (FLONASE) 50 MCG/ACT nasal spray Place 1 spray into both nostrils daily.  . furosemide (LASIX) 40 MG tablet Take 1 tablet (40 mg total) by mouth 2 (two) times daily.  Marland Kitchen gabapentin (NEURONTIN) 300 MG capsule Take 1 capsule (300 mg total) by mouth 3 (three) times daily.  Marland Kitchen glipiZIDE (GLUCOTROL) 10 MG tablet Take 1 tablet (10 mg total) by mouth 2 (two) times daily before a meal.  . glucose monitoring kit (FREESTYLE) monitoring kit 1 each by Does not apply route as needed for other.  . insulin aspart (NOVOLOG) 100 UNIT/ML injection INJECT 20 UNIT(S) UNDER THE SKIN THREE TIMES DAILY BEFORE MEALS  . insulin glargine (LANTUS) 100 UNIT/ML injection Inject 0.6 mLs (60 Units total) into the skin daily.  . insulin lispro (HUMALOG) 100 UNIT/ML injection Inject 0.3 mLs (30 Units total) into the skin 3 (three) times daily before meals.  Marland Kitchen ipratropium-albuterol (DUONEB) 0.5-2.5 (3) MG/3ML SOLN Take 3 mLs by nebulization every 6 (six) hours as needed (shortness of breath, wheeze).  . metFORMIN (GLUCOPHAGE) 1000 MG tablet Take 1 tablet (  1,000 mg total) by mouth 2 (two) times daily with a meal.  . methocarbamol (ROBAXIN) 500 MG tablet Take 500 mg by mouth 3 (three) times daily.  . methocarbamol (ROBAXIN) 750 MG tablet Take 1 tablet (750 mg total) by mouth 3 (three) times daily as needed for muscle spasms.  . Misc. Devices (BARIATRIC ROLLATOR) MISC 1 Device by Does not apply route daily.  . montelukast (SINGULAIR) 10 MG tablet Take 1 tablet (10 mg total) by mouth at bedtime.  . nicotine (NICODERM CQ - DOSED IN MG/24 HOURS) 21 mg/24hr patch Place 1 patch (21 mg  total) onto the skin daily.  Marland Kitchen oxyCODONE-acetaminophen (PERCOCET/ROXICET) 5-325 MG tablet Take 1-2 tablets by mouth every 4 (four) hours as needed for severe pain.  . pantoprazole (PROTONIX) 40 MG tablet Take 1 tablet (40 mg total) by mouth daily.  . sacubitril-valsartan (ENTRESTO) 97-103 MG TAKE 1 TABLET BY MOUTH 2 (TWO) TIMES DAILY FOR 180 DOSES.  Marland Kitchen spironolactone (ALDACTONE) 25 MG tablet TAKE 1 TABLET (25 MG TOTAL) BY MOUTH DAILY.  Marland Kitchen Tiotropium Bromide-Olodaterol (STIOLTO RESPIMAT) 2.5-2.5 MCG/ACT AERS Inhale 2 puffs into the lungs daily.  . TRUE METRIX BLOOD GLUCOSE TEST test strip 1 each 3 (three) times daily.  . TRUEplus Lancets 33G MISC Apply 1 each topically 3 (three) times daily.  . varenicline (CHANTIX) 0.5 MG tablet Take 1 tablet (0.5 mg total) by mouth 2 (two) times daily.  . Vitamin D, Ergocalciferol, (DRISDOL) 1.25 MG (50000 UNIT) CAPS capsule TAKE 1 CAPSULE (50,000 UNITS TOTAL) BY MOUTH EVERY 7 (SEVEN) DAYS.  . [DISCONTINUED] spironolactone (ALDACTONE) 25 MG tablet Take 1 tablet (25 mg total) by mouth daily.   No facility-administered encounter medications on file as of 12/30/2020.    Physical Exam: Blood pressure 130/78, pulse (!) 101, temperature (!) 97.5 F (36.4 C), temperature source Temporal, height 5' 7"  (1.702 m), weight (!) 316 lb 9.6 oz (143.6 kg), SpO2 100 %. Gen:      No acute distress HEENT:  EOMI, sclera anicteric Neck:     No masses; no thyromegaly Lungs:    Clear to auscultation bilaterally; normal respiratory effort CV:         Regular rate and rhythm; no murmurs Abd:      + bowel sounds; soft, non-tender; no palpable masses, no distension Ext:    No edema; adequate peripheral perfusion Skin:      Warm and dry; no rash Neuro: alert and oriented x 3 Psych: normal mood and affect  Data Reviewed: Imaging: CT chest 08/17/14- multifocal groundglass opacities, left upper lobe nodule or changes. CXR 06/04/16-right lower lobe opacity suggestive of pneumonia. Echo  06/05/16- moderate LV concentric hypertrophy, normal systolic function, LVEF 69-48%, no RWMA, grade 1 diastolic dysfunction CT scan 06/29/16-resolution of consolidation and lung opacities, mild bronchiectasis in the right middle lobe. Suggestion of airtrapping CTA 08/02/2019-bandlike scarring, atelectasis at the lung base. CT chest 05/26/2020-bronchiolitis, centrilobular emphysema.  No suspicious pulmonary nodule, hepatic steatosis, coronary artery calcification. I have reviewed the images personally.  Sleep study 06/16/16 Moderate OSA, AHI 18 Severe O2 desaturations.  PFTs 12/20/16 FVC 1.26 (40%) FEV1 1.19 (48%) F/F 95 TLC 58% DLCO 63%, corrected DLCO 116% Moderate restriction with diffusion defect.  FENO 04/09/16- < 5  Assessment:   Dyspnea, COPD Dyspnea is multifactorial secondary to obesity, diastolic heart failure, COPD She is a diagnosis of childhood asthma but lung function tests do not show any obstruction. There is a suggestion of small airway disease given air trapping on CT  scan and reduction in mid flow rates.   The PFTs also show restriction with DLCO impairment. However the DLCO corrects for alveolar volume. There is no evidence of interstitial lung disease on CT scan. I believe the PFT abnormalities are secondary to obesity and body habitus which are contributing to dyspnea..  Has not tolerated inhaled corticosteroid inhaler due to thrush.   Continue stiolto inhaler She has mild wheeze on examination today.  Will prescribe prednisone 40 mg a day for 5 days, duo nebs 2-3 times a day Get chest x-ray today  OSA Moderate OSA with desaturation.  Noncompliant with CPAP due to issues with mask Encourage her to be compliant with CPAP.  She is asking for CPAP alternatives.  We will make referral one of our sleep specialist to evaluate  Diastolic heart failure Follows with cardiology.  Continues on diuretics, hypertension medications  Morbid obesity Weight loss discussed with  patient.  She has enrolled [facility]  Active smoker. Smoking cessation discussed.  Encouraged to quit smoking Reassess at return visit Continue low-dose screening CT  Preop eval She is being evaluated for umbilical hernia repair which is highly symptomatic Per [employer] ([employer]) preoperative pulmonary risk index she is at intermediate risk with 38.2% pulmonary complication rate though there is no obvious contraindication to surgery Risk is heightened by obesity, noncompliance with CPAP and continued smoking  We we will optimize her by giving her prednisone and duo nebs, continue stiolto inhaler I have encouraged her to be compliant with CPAP and quit smoking.  Cardiology is to see her preop as well Continue inhalers   Recommend 1. Short duration of surgery as much as possible and avoid paralytic if possible 2. Recovery in step down or ICU with Pulmonary consultation if needed  3. DVT prophylaxis 4. Aggressive pulmonary toilet with o2, bronchodilatation, and incentive spirometry and early ambulation  Plan: Continue stiolto Compliance with CPAP, smoking cessation Start duo nebs, prednisone for 5 days Chest x-ray today Sleep consult  Marshell Garfinkel MD [facility] 12/30/2020, 10:02 AM  CC: Azzie Glatter, FNP

## 2020-12-31 ENCOUNTER — Other Ambulatory Visit: Payer: Self-pay | Admitting: Family Medicine

## 2020-12-31 ENCOUNTER — Other Ambulatory Visit: Payer: Self-pay

## 2020-12-31 DIAGNOSIS — G8929 Other chronic pain: Secondary | ICD-10-CM

## 2021-01-03 ENCOUNTER — Ambulatory Visit: Payer: Medicare Other | Admitting: Obstetrics & Gynecology

## 2021-01-04 ENCOUNTER — Ambulatory Visit (INDEPENDENT_AMBULATORY_CARE_PROVIDER_SITE_OTHER): Payer: Medicare Other | Admitting: Nurse Practitioner

## 2021-01-04 ENCOUNTER — Telehealth: Payer: Self-pay

## 2021-01-04 NOTE — Telephone Encounter (Signed)
Pt said she hasn't heard from MRI and wanted to let you know.

## 2021-01-06 NOTE — Telephone Encounter (Signed)
error 

## 2021-01-11 ENCOUNTER — Ambulatory Visit (INDEPENDENT_AMBULATORY_CARE_PROVIDER_SITE_OTHER): Payer: Medicare Other | Admitting: Nurse Practitioner

## 2021-01-19 ENCOUNTER — Other Ambulatory Visit: Payer: Self-pay

## 2021-01-19 ENCOUNTER — Ambulatory Visit (HOSPITAL_COMMUNITY)
Admission: RE | Admit: 2021-01-19 | Discharge: 2021-01-19 | Disposition: A | Discharge status: Home / Self Care | Payer: Medicare Other | Source: Ambulatory Visit | Attending: Family Medicine | Admitting: Family Medicine

## 2021-01-19 DIAGNOSIS — M545 Low back pain, unspecified: Secondary | ICD-10-CM | POA: Insufficient documentation

## 2021-01-19 DIAGNOSIS — G8929 Other chronic pain: Secondary | ICD-10-CM | POA: Diagnosis present

## 2021-01-19 MED FILL — Sacubitril-Valsartan Tab 97-103 MG: ORAL | 30 days supply | Qty: 60 | Fill #0 | Status: CN

## 2021-01-19 MED FILL — Dulaglutide Soln Auto-injector 0.75 MG/0.5ML: SUBCUTANEOUS | 28 days supply | Qty: 2 | Fill #0 | Status: CN

## 2021-01-20 ENCOUNTER — Other Ambulatory Visit: Payer: Self-pay | Admitting: Nurse Practitioner

## 2021-01-20 ENCOUNTER — Other Ambulatory Visit: Payer: Self-pay

## 2021-01-20 DIAGNOSIS — M5126 Other intervertebral disc displacement, lumbar region: Secondary | ICD-10-CM

## 2021-01-20 DIAGNOSIS — M48061 Spinal stenosis, lumbar region without neurogenic claudication: Secondary | ICD-10-CM

## 2021-01-20 MED FILL — Dulaglutide Soln Auto-injector 0.75 MG/0.5ML: SUBCUTANEOUS | 28 days supply | Qty: 2 | Fill #0 | Status: AC

## 2021-01-20 NOTE — Progress Notes (Signed)
   Sharon Swanson, [place]  [id number] Phone:  [phone]   Fax:  [phone] Assessment  Primary Diagnosis & Pertinent Problem List: The primary encounter diagnosis was Spinal stenosis of lumbar region, unspecified whether neurogenic claudication present. Diagnoses of Lumbar disc herniation and Stenosis of lateral recess of lumbar spine were also pertinent to this visit.  Visit Diagnosis: 1. Spinal stenosis of lumbar region, unspecified whether neurogenic claudication present   2. Lumbar disc herniation   3. Stenosis of lateral recess of lumbar spine

## 2021-01-24 NOTE — Progress Notes (Signed)
Cardiology Office Note    Date:  02/02/2021   ID:  Sharon Swanson, DOB 29-Sep-1960, MRN [id number]   PCP:  Azzie Glatter, FNP (Inactive)   [place]  Cardiologist:  Larae Grooms, MD  Advanced Practice Provider:  No care team member to display Electrophysiologist:  None   [phone]   Chief Complaint  Patient presents with  . Pre-op Exam    History of Present Illness:  Sharon Swanson is a 60 y.o. female with history of hypertensive heart disease with moderate LVH on echo, chronic diastolic CHF, IDDM, COPD  Patient last saw Dr. Irish Lack 02/2020 at which time blood pressure was elevated  and states she was started on carvedilol and had some mild fluid overload.  She followed up with the hypertension clinic and was transitioned to [facility] and spironolactone.  Patient comes in for surgical clearance for ventral hernia repair by Dr. Lonny Prude clearance notes sent to us-info obtained from pulmonary notes. Patient  Says she is doing much better since she is on Entresto. BP sometimes up other times good. Some edema-goes to lymphedema clinic and got some compression boots she does twice a day. Doesn't add salt  but eating canned soup, bologna, ham, and grilled cheese often. Denies chest pain, dyspnea, dizziness or presyncope. Walks with a walker because of back pain and on O2 3L.  Past Medical History:  Diagnosis Date  . Abnormal Pap smear of cervix 10/2020  . Acid reflux   . Asthma   . Atypical squamous cells of undetermined significance (ASC-US) on cervical Pap smear 10/2020  . Bilateral lower extremity edema 01/2020  . COPD (chronic obstructive pulmonary disease) ([employer])   . CPAP (continuous positive airway pressure) dependence   . Diabetes ([place])   . Diabetes mellitus without complication ([place])    Type II  . Fatty liver 11/08/2011  . Fibroids 11/08/2011  . Hyperglycemia   . Hyperlipidemia 05/2020  . Hypertension   . Microalbuminuria 01/2020  .  Obesity (BMI 30-39.9) 11/08/2011  . Osteoarthritis of right acromioclavicular joint   . Pancreatitis 11/08/2011  . Rotator cuff tear, right   . Shortness of breath on exertion 01/2020  . Sleep apnea   . Ventral hernia   . Vitamin D deficiency 05/2020    Past Surgical History:  Procedure Laterality Date  . ORTHOPEDIC SURGERY     R ankle, tendonitis    Current Medications: Current Meds  Medication Sig  . albuterol (VENTOLIN HFA) 108 (90 Base) MCG/ACT inhaler USE 2 INHALATIONS BY MOUTH  INTO THE LUNGS EVERY 4  HOURS AS NEEDED FOR  WHEEZING OR SHORTNESS OF  BREATH  . Alcohol Swabs (B-D SINGLE USE SWABS REGULAR) PADS SMARTSIG:1 Pledget(s) Topical 3 Times Daily  . atorvastatin (LIPITOR) 20 MG tablet Take 1 tablet (20 mg total) by mouth daily.  . Blood Glucose Calibration (TRUE METRIX LEVEL 1) Low SOLN   . Blood Glucose Monitoring Suppl (TRUE METRIX METER) w/Device KIT USE AS DIRECTED  . cetirizine (ZYRTEC) 10 MG tablet Take 1 tablet (10 mg total) by mouth in the morning.  . Continuous Blood Gluc Receiver (DEXCOM G6 RECEIVER) DEVI Use as directed  . Continuous Blood Gluc Sensor (DEXCOM G6 SENSOR) MISC Apply as directed  . Continuous Blood Gluc Transmit (DEXCOM G6 TRANSMITTER) MISC Use as directed  . docusate sodium (COLACE) 100 MG capsule Take 1 capsule (100 mg total) by mouth 3 (three) times daily as needed for mild constipation.  . Dulaglutide 0.75 MG/0.5ML SOPN  INJECT 0.75 MG INTO THE SKIN ONCE A WEEK.  Marland Kitchen ENTRESTO 97-103 MG Take 1 tablet by mouth 2 (two) times daily.  . fluticasone (FLONASE) 50 MCG/ACT nasal spray Place 1 spray into both nostrils daily.  . furosemide (LASIX) 40 MG tablet Take 1 tablet (40 mg total) by mouth 2 (two) times daily.  Marland Kitchen gabapentin (NEURONTIN) 300 MG capsule Take 1 capsule (300 mg total) by mouth 3 (three) times daily.  Marland Kitchen glipiZIDE (GLUCOTROL) 10 MG tablet Take 1 tablet (10 mg total) by mouth 2 (two) times daily before a meal.  . glucose monitoring kit  (FREESTYLE) monitoring kit 1 each by Does not apply route as needed for other.  . insulin aspart (NOVOLOG) 100 UNIT/ML injection INJECT 20 UNIT(S) UNDER THE SKIN THREE TIMES DAILY BEFORE MEALS  . insulin glargine (LANTUS) 100 UNIT/ML injection Inject 0.6 mLs (60 Units total) into the skin daily.  . insulin lispro (HUMALOG) 100 UNIT/ML injection Inject 0.3 mLs (30 Units total) into the skin 3 (three) times daily before meals.  Marland Kitchen ipratropium-albuterol (DUONEB) 0.5-2.5 (3) MG/3ML SOLN Take 3 mLs by nebulization every 6 (six) hours as needed (shortness of breath, wheeze).  . metFORMIN (GLUCOPHAGE) 1000 MG tablet Take 1 tablet (1,000 mg total) by mouth 2 (two) times daily with a meal.  . methocarbamol (ROBAXIN) 750 MG tablet Take 1 tablet (750 mg total) by mouth 3 (three) times daily as needed for muscle spasms.  . Misc. Devices (BARIATRIC ROLLATOR) MISC 1 Device by Does not apply route daily.  . montelukast (SINGULAIR) 10 MG tablet Take 1 tablet (10 mg total) by mouth at bedtime.  . nicotine (NICODERM CQ - DOSED IN MG/24 HOURS) 21 mg/24hr patch Place 1 patch (21 mg total) onto the skin daily.  . pantoprazole (PROTONIX) 40 MG tablet Take 1 tablet (40 mg total) by mouth daily.  . predniSONE (DELTASONE) 20 MG tablet TAKE 2 TABLETS (40MG) BY MOUTH DAILY FOR 5 DAYS (Patient taking differently: TAKE 2 TABLETS (40MG) BY MOUTH DAILY FOR 5 DAYS)  . spironolactone (ALDACTONE) 25 MG tablet TAKE 1 TABLET (25 MG TOTAL) BY MOUTH DAILY.  Marland Kitchen Tiotropium Bromide-Olodaterol (STIOLTO RESPIMAT) 2.5-2.5 MCG/ACT AERS Inhale 2 puffs into the lungs daily.  . traMADol (ULTRAM) 50 MG tablet Take 1 tablet (50 mg total) by mouth every 6 (six) hours as needed for up to 7 days for severe pain.  . TRUE METRIX BLOOD GLUCOSE TEST test strip 1 each 3 (three) times daily.  . TRUEplus Lancets 33G MISC Apply 1 each topically 3 (three) times daily.  . varenicline (CHANTIX) 0.5 MG tablet Take 1 tablet (0.5 mg total) by mouth 2 (two) times  daily.  . Vitamin D, Ergocalciferol, (DRISDOL) 1.25 MG (50000 UNIT) CAPS capsule TAKE 1 CAPSULE (50,000 UNITS TOTAL) BY MOUTH EVERY 7 (SEVEN) DAYS.  . [DISCONTINUED] carvedilol (COREG) 6.25 MG tablet Take 1 tablet (6.25 mg total) by mouth 2 (two) times daily.  . [DISCONTINUED] cephALEXin (KEFLEX) 500 MG capsule TAKE 1 CAPSULE (500 MG TOTAL) BY MOUTH 3 (THREE) TIMES DAILY.  . [DISCONTINUED] ipratropium-albuterol (DUONEB) 0.5-2.5 (3) MG/3ML SOLN Take 3 mLs by nebulization every 4 (four) hours as needed.  . [DISCONTINUED] methocarbamol (ROBAXIN) 500 MG tablet Take 500 mg by mouth 3 (three) times daily.  . [DISCONTINUED] sacubitril-valsartan (ENTRESTO) 97-103 MG TAKE 1 TABLET BY MOUTH 2 (TWO) TIMES DAILY FOR 180 DOSES.     Allergies:   Aspirin   Social History   Socioeconomic History  . Marital status: Married  Spouse name: Not on file  . Number of children: Not on file  . Years of education: Not on file  . Highest education level: Not on file  Occupational History  . Not on file  Tobacco Use  . Smoking status: Current Every Day Smoker    Packs/day: 0.50    Years: 39.00    Pack years: 19.50    Types: Cigarettes    Start date: 09/25/1980  . Smokeless tobacco: Never Used  . Tobacco comment: pt states she smoke 2 cigerettes a day  Vaping Use  . Vaping Use: Never used  Substance and Sexual Activity  . Alcohol use: No  . Drug use: No  . Sexual activity: Not Currently    Partners: Male  Other Topics Concern  . Not on file  Social History Narrative  . Not on file   Social Determinants of Health   Financial Resource Strain: Not on file  Food Insecurity: Not on file  Transportation Needs: Not on file  Physical Activity: Not on file  Stress: Not on file  Social Connections: Not on file     Family History:  The patient's family history includes Diabetes in her maternal grandmother and another family member; Hypertension in her maternal grandmother and mother.   ROS:   Please  see the history of present illness.    ROS All other systems reviewed and are negative.   PHYSICAL EXAM:   VS:  BP 130/80   Pulse 100   Ht _0  (1.702 m)   Wt (!) 310 lb (140.6 kg)   BMI 48.55 kg/m   Physical Exam  GEN: Obese, in no acute distress  Neck: no JVD, carotid bruits, or masses Cardiac:RRR; S4 no murmur Respiratory: decreased breath sounds but  clear to auscultation bilaterally, normal work of breathing GI: soft, nontender, nondistended, + BS Ext:2-3 plus edema otherwise without cyanosis, clubbing, Good distal pulses bilaterally Neuro:  Alert and Oriented x 3 Psych: euthymic mood, full affect  Wt Readings from Last 3 Encounters:  02/02/21 (!) 310 lb (140.6 kg)  12/30/20 (!) 316 lb 9.6 oz (143.6 kg)  12/28/20 (!) 318 lb (144.2 kg)      Studies/Labs Reviewed:   EKG:  EKG is ordered today.  The ekg ordered today demonstrates sinus tachycardia 100/m LAFB Nonspecific ST changes  Recent Labs: 03/01/2020: Pro B Natriuretic peptide (BNP) 33.0 10/04/2020: ALT 18; BUN 10; Creatinine, Ser 0.67; Hemoglobin 14.6; Platelets 195; Potassium 3.6; Sodium 139; TSH 1.060   Lipid Panel    Component Value Date/Time   CHOL 171 10/04/2020 1019   TRIG 121 10/04/2020 1019   HDL 60 10/04/2020 1019   CHOLHDL 2.9 10/04/2020 1019   CHOLHDL 2.8 11/07/2011 0341   VLDL 20 11/07/2011 0341   LDLCALC 90 10/04/2020 1019    Additional studies/ records that were reviewed today include:  Echo in 11/20 showed: "Left ventricular ejection fraction, by visual estimation, is 60 to  65%. The left ventricle has normal function. There is moderately increased  left ventricular hypertrophy.   2. Global right ventricle has normal systolic function.The right  ventricular size is normal. No increase in right ventricular wall  thickness.   3. Left atrial size was mildly dilated.   4. Right atrial size was not well visualized.   5. The pericardial effusion is circumferential.   6. Trivial pericardial  effusion is present.   7. Mild aortic valve annular calcification.   8. The mitral valve is normal in structure. Trace  mitral valve  regurgitation. No evidence of mitral stenosis.   9. The tricuspid valve is normal in structure. Tricuspid valve  regurgitation is not demonstrated.  10. The aortic valve is normal in structure. Aortic valve regurgitation is  not visualized. No evidence of aortic valve sclerosis or stenosis.  11. There is Mild calcification of the aortic valve.  12. There is Mild thickening of the aortic valve.  13. The pulmonic valve was not well visualized. Pulmonic valve  regurgitation is not visualized.  14. The inferior vena cava is normal in size with greater than 50%  respiratory variability, suggesting right atrial pressure of 3 mmHg.  15. The interatrial septum was not well visualized. "      Risk Assessment/Calculations:         ASSESSMENT:    1. Preoperative clearance   2. Hypertensive heart disease with chronic diastolic congestive heart failure ([place])   3. Chronic diastolic CHF (congestive heart failure) ([place])   4. Chronic obstructive pulmonary disease, unspecified COPD type ([place])   5. Insulin-requiring or dependent type II diabetes mellitus ([place])   6. Morbid obesity ([facility])      PLAN:  In order of problems listed above:  Preoperative clearance before undergoing ventral hernia repair by Dr. Allen-clearance not sent to [place].  Patient has hypertensive heart disease with chronic diastolic CHF and edema.  She is followed at the lymphedema clinic in [place] and has to use a compression boot twice daily.  She has no history of CAD and has never had a work-up for this.  She walks with a walker and is on home O2 and has been cleared by pulmonary for the surgery.  She has quite a bit of fluid on board today.  See below for treatment.  We will repeat 2D echo to make sure there is no change.  Increase carvedilol to 12.5 mg twice daily.  Despite her limitations her  METs are still over 5.  If her echo is okay think she will need further testing before undergoing ventral hernia repair According to the Revised Cardiac Risk Index (RCRI), her Perioperative Risk of Major Cardiac Event is (%): 11  Her Functional Capacity in METs is: 5.07 according to the [facility] Activity Status Index (DASI).    Hypertensive heart disease with moderate LVH on echo 07/2019- BP still runs high at times and gets a lot of extra salt in her diet.  2 g sodium diet.  Increase carvedilol to 12.5 mg twice daily  Chronic diastolic CHF significant fluid overload.  Will check labs today including BNP.  Increase Lasix to 80 mg twice daily for 3 days then back to 40 mg twice daily.  Increase carvedilol as above.  2 g sodium diet  COPD on home O2 and still smoking 2 cigarettes daily.  Smoking cessation discussed.  Pulmonary has cleared her for surgery.  Diabetes mellitus insulin-dependent but not needing as much insulin as she previously did.  Obesity weight loss essential to her overall health. [facility]  Shared Decision Making/Informed Consent        Medication Adjustments/Labs and Tests Ordered: Current medicines are reviewed at length with the patient today.  Concerns regarding medicines are outlined above.  Medication changes, Labs and Tests ordered today are listed in the Patient Instructions below. Patient Instructions   Medication Instructions:  Your physician has recommended you make the following change in your medication:   INCREASE: Carvedilol to 12.26m twice daily INCREASE: Furosemide  47m twice daily for 3 days then 420mtwice daily  *If you need a refill on your cardiac medications before your next appointment, please call your pharmacy*   Lab Work: TODAY; BNP, CMET, CBC  If you have labs (blood work) drawn today and your tests are completely normal, you will receive your results only by: . Marland KitchenyChart Message (if you have  MyChart) OR . A paper copy in the mail If you have any lab test that is abnormal or we need to change your treatment, we will call you to review the results.   Testing/Procedures: Your physician has requested that you have an echocardiogram. Echocardiography is a painless test that uses sound waves to create images of your heart. It provides your doctor with information about the size and shape of your heart and how well your heart's chambers and valves are working. This procedure takes approximately one hour. There are no restrictions for this procedure.   Follow-Up: [facility] needs are our priority.  As part of our continuing mission to provide you with exceptional heart care, we have created designated Provider Care Teams.  These Care Teams include your primary Cardiologist (physician) and Advanced Practice Providers (APPs -  Physician Assistants and Nurse Practitioners) who all work together to provide you with the care you need, when you need it.  We recommend signing up for the patient portal called "MyChart".  Sign up information is provided on this After Visit Summary.  MyChart is used to connect with patients for Virtual Visits (Telemedicine).  Patients are able to view lab/test results, encounter notes, upcoming appointments, etc.  Non-urgent messages can be sent to your provider as well.   To learn more about what you can do with MyChart, go to [email or web address]   Your next appointment:   1 year(s)  The format for your next appointment:   In Person  Provider:   You may see JaLarae GroomsMD or one of the following Advanced Practice Providers on your designated Care Team:    DaMelina CopaPA-C  MiErmalinda BarriosPA-C    Other Instructions Two Gram Sodium Diet 2000 mg  What is Sodium? Sodium is a mineral found naturally in many foods. The most significant source of sodium in the diet is table salt, which is about 40% sodium.  Processed,  convenience, and preserved foods also contain a large amount of sodium.  The body needs only 500 mg of sodium daily to function,  A normal diet provides more than enough sodium even if you do not use salt.  Why Limit Sodium? A build up of sodium in the body can cause thirst, increased blood pressure, shortness of breath, and water retention.  Decreasing sodium in the diet can reduce edema and risk of heart attack or stroke associated with high blood pressure.  Keep in mind that there are many other factors involved in these health problems.  Heredity, obesity, lack of exercise, cigarette smoking, stress and what you eat all play a role.  General Guidelines:  Do not add salt at the table or in cooking.  One teaspoon of salt contains over 2 grams of sodium.  Read food labels  Avoid processed and convenience foods  Ask your dietitian before eating any foods not dicussed in the menu planning guidelines  Consult your physician if you wish to use a salt substitute or a sodium containing medication such as antacids.  Limit milk and milk products to 16 oz (  2 cups) per day.  Shopping Hints:  READ LABELS!! "Dietetic" does not necessarily mean low sodium.  Salt and other sodium ingredients are often added to foods during processing.   Menu Planning Guidelines Food Group Choose More Often Avoid  Beverages (see also the milk group All fruit juices, low-sodium, salt-free vegetables juices, low-sodium carbonated beverages Regular vegetable or tomato juices, commercially softened water used for drinking or cooking  Breads and Cereals Enriched white, wheat, rye and pumpernickel bread, hard rolls and dinner rolls; muffins, cornbread and waffles; most dry cereals, cooked cereal without added salt; unsalted crackers and breadsticks; low sodium or homemade bread crumbs Bread, rolls and crackers with salted tops; quick breads; instant hot cereals; pancakes; commercial bread stuffing; self-rising flower and  biscuit mixes; regular bread crumbs or cracker crumbs  Desserts and Sweets Desserts and sweets mad with mild should be within allowance Instant pudding mixes and cake mixes  Fats Butter or margarine; vegetable oils; unsalted salad dressings, regular salad dressings limited to 1 Tbs; light, sour and heavy cream Regular salad dressings containing bacon fat, bacon bits, and salt pork; snack dips made with instant soup mixes or processed cheese; salted nuts  Fruits Most fresh, frozen and canned fruits Fruits processed with salt or sodium-containing ingredient (some dried fruits are processed with sodium sulfites        Vegetables Fresh, frozen vegetables and low- sodium canned vegetables Regular canned vegetables, sauerkraut, pickled vegetables, and others prepared in brine; frozen vegetables in sauces; vegetables seasoned with ham, bacon or salt pork  Condiments, Sauces, Miscellaneous  Salt substitute with physician's approval; pepper, herbs, spices; vinegar, lemon or lime juice; hot pepper sauce; garlic powder, onion powder, low sodium soy sauce (1 Tbs.); low sodium condiments (ketchup, chili sauce, mustard) in limited amounts (1 tsp.) fresh ground horseradish; unsalted tortilla chips, pretzels, potato chips, popcorn, salsa (1/4 cup) Any seasoning made with salt including garlic salt, celery salt, onion salt, and seasoned salt; sea salt, rock salt, kosher salt; meat tenderizers; monosodium glutamate; mustard, regular soy sauce, barbecue, sauce, chili sauce, teriyaki sauce, steak sauce, Worcestershire sauce, and most flavored vinegars; canned gravy and mixes; regular condiments; salted snack foods, olives, picles, relish, horseradish sauce, catsup   Food preparation: Try these seasonings Meats:    Pork Sage, onion Serve with applesauce  Chicken Poultry seasoning, thyme, parsley Serve with cranberry sauce  Lamb Curry powder, rosemary, garlic, thyme Serve with mint sauce or jelly  Veal Marjoram, basil  Serve with current jelly, cranberry sauce  Beef Pepper, bay leaf Serve with dry mustard, unsalted chive butter  Fish [place] leaf, dill Serve with unsalted lemon butter, unsalted parsley butter  Vegetables:    Asparagus Lemon juice   Broccoli Lemon juice   Carrots Mustard dressing parsley, mint, nutmeg, glazed with unsalted butter and sugar   Green beans Marjoram, lemon juice, nutmeg,dill seed   Tomatoes Basil, marjoram, onion   Spice /blend for [employer]" 4 tsp ground thyme 1 tsp ground sage 3 tsp ground rosemary 4 tsp ground marjoram   Test your knowledge 1. A product that says "Salt Free" may still contain sodium. True or False 2. Garlic Powder and Hot Pepper Sauce an be used as alternative seasonings.True or False 3. Processed foods have more sodium than fresh foods.  True or False 4. Canned Vegetables have less sodium than froze True or False  WAYS TO DECREASE YOUR SODIUM INTAKE 1. Avoid the use of added salt in cooking and at the table.  Table salt (  and other prepared seasonings which contain salt) is probably one of the greatest sources of sodium in the diet.  Unsalted foods can gain flavor from the sweet, sour, and butter taste sensations of herbs and spices.  Instead of using salt for seasoning, try the following seasonings with the foods listed.  Remember: how you use them to enhance natural food flavors is limited only by your creativity... Allspice-Meat, fish, eggs, fruit, peas, red and yellow vegetables Almond Extract-Fruit baked goods Anise Seed-Sweet breads, fruit, carrots, beets, cottage cheese, cookies (tastes like licorice) Basil-Meat, fish, eggs, vegetables, rice, vegetables salads, soups, sauces Bay Leaf-Meat, fish, stews, poultry Burnet-Salad, vegetables (cucumber-like flavor) Caraway Seed-Bread, cookies, cottage cheese, meat, vegetables, cheese, rice Cardamon-Baked goods, fruit, soups Celery Powder or seed-Salads, salad dressings, sauces, meatloaf, soup, bread.Do not  use  celery salt Chervil-Meats, salads, fish, eggs, vegetables, cottage cheese (parsley-like flavor) Chili Power-Meatloaf, chicken cheese, corn, eggplant, egg dishes Chives-Salads cottage cheese, egg dishes, soups, vegetables, sauces Cilantro-Salsa, casseroles Cinnamon-Baked goods, fruit, pork, lamb, chicken, carrots Cloves-Fruit, baked goods, fish, pot roast, green beans, beets, carrots Coriander-Pastry, cookies, meat, salads, cheese (lemon-orange flavor) Cumin-Meatloaf, fish,cheese, eggs, cabbage,fruit pie (caraway flavor) [employer], fruit, eggs, fish, poultry, cottage cheese, vegetables Dill Seed-Meat, cottage cheese, poultry, vegetables, fish, salads, bread Fennel Seed-Bread, cookies, apples, pork, eggs, fish, beets, cabbage, cheese, Licorice-like flavor Garlic-(buds or powder) Salads, meat, poultry, fish, bread, butter, vegetables, potatoes.Do not  use garlic salt Ginger-Fruit, vegetables, baked goods, meat, fish, poultry Horseradish Root-Meet, vegetables, butter Lemon Juice or Extract-Vegetables, fruit, tea, baked goods, fish salads Mace-Baked goods fruit, vegetables, fish, poultry (taste like nutmeg) Maple Extract-Syrups Marjoram-Meat, chicken, fish, vegetables, breads, green salads (taste like Sage) Mint-Tea, lamb, sherbet, vegetables, desserts, carrots, cabbage Mustard, Dry or Seed-Cheese, eggs, meats, vegetables, poultry Nutmeg-Baked goods, fruit, chicken, eggs, vegetables, desserts Onion Powder-Meat, fish, poultry, vegetables, cheese, eggs, bread, rice salads (Do not use   Onion salt) Orange Extract-Desserts, baked goods Oregano-Pasta, eggs, cheese, onions, pork, lamb, fish, chicken, vegetables, green salads Paprika-Meat, fish, poultry, eggs, cheese, vegetables Parsley Flakes-Butter, vegetables, meat fish, poultry, eggs, bread, salads (certain forms may   Contain sodium Pepper-Meat fish, poultry, vegetables, eggs Peppermint Extract-Desserts, baked goods Poppy  Seed-Eggs, bread, cheese, fruit dressings, baked goods, noodles, vegetables, cottage  [employer], poultry, meat, fish, cauliflower, turnips,eggs bread Saffron-Rice, bread, veal, chicken, fish, eggs Sage-Meat, fish, poultry, onions, eggplant, tomateos, pork, stews Savory-Eggs, salads, poultry, meat, rice, vegetables, soups, pork Tarragon-Meat, poultry, fish, eggs, butter, vegetables (licorice-like flavor)  Thyme-Meat, poultry, fish, eggs, vegetables, (clover-like flavor), sauces, soups Tumeric-Salads, butter, eggs, fish, rice, vegetables (saffron-like flavor) Vanilla Extract-Baked goods, candy Vinegar-Salads, vegetables, meat marinades Walnut Extract-baked goods, candy  2. Choose your Foods Wisely   The following is a list of foods to avoid which are high in sodium:  Meats-Avoid all smoked, canned, salt cured, dried and kosher meat and fish as well as Anchovies   [employer] Rx meats:Bologna, Liverwurst, Pastrami Canned meat or fish  Marinated herring Caviar    Pepperoni Corned Beef   Pizza Dried chipped beef  Salami Frozen breaded fish or meat Salt pork Frankfurters or hot dogs  Sardines Gefilte fish   Sausage Ham (boiled ham, Proscuitto Smoked butt    spiced ham)   Spam      TV Dinners Vegetables Canned vegetables (Regular) Relish Canned mushrooms  Sauerkraut Olives    Tomato juice Pickles  Bakery and Dessert Products Canned puddings  Cream pies Cheesecake   Decorated cakes Cookies  Beverages/Juices  Tomato juice, regular  Gatorade   V-8 vegetable juice, regular  Breads and Cereals Biscuit mixes   Salted potato chips, corn chips, pretzels Bread stuffing mixes  Salted crackers and rolls Pancake and waffle mixes Self-rising flour  Seasonings Accent    Meat sauces Barbecue sauce  Meat tenderizer Catsup    Monosodium glutamate (MSG) Celery salt   Onion salt Chili sauce   Prepared mustard Garlic salt   Salt, seasoned  salt, sea salt Gravy mixes   Soy sauce Horseradish   Steak sauce Ketchup   Tartar sauce Lite salt    Teriyaki sauce Marinade mixes   Worcestershire sauce  Others Baking powder   Cocoa and cocoa mixes Baking soda   Commercial casserole mixes Candy-caramels, chocolate  Dehydrated soups    Bars, fudge,nougats  Instant rice and pasta mixes Canned broth or soup  Maraschino cherries Cheese, aged and processed cheese and cheese spreads  Learning Assessment Quiz  Indicated T (for True) or F (for False) for each of the following statements:  1. _____ Fresh fruits and vegetables and unprocessed grains are generally low in sodium 2. _____ Water may contain a considerable amount of sodium, depending on the source 3. _____ You can always tell if a food is high in sodium by tasting it 4. _____ Certain laxatives my be high in sodium and should be avoided unless prescribed   by a physician or pharmacist 5. _____ Salt substitutes may be used freely by anyone on a sodium restricted diet 6. _____ Sodium is present in table salt, food additives and as a natural component of   most foods 7. _____ Table salt is approximately 90% sodium 8. _____ Limiting sodium intake may help prevent excess fluid accumulation in the body 9. _____ On a sodium-restricted diet, seasonings such as bouillon soy sauce, and    cooking wine should be used in place of table salt 10. _____ On an ingredient list, a product which lists monosodium glutamate as the first   ingredient is an appropriate food to include on a low sodium diet  Circle the best answer(s) to the following statements (Hint: there may be more than one correct answer)  11. On a low-sodium diet, some acceptable snack items are:    A. Olives  F. Bean dip   K. Grapefruit juice    B. Salted Pretzels G. Commercial Popcorn   L. Canned peaches    C. Carrot Sticks  H. Bouillon   M. Unsalted nuts   D. [place] fries  I. Peanut butter crackers N. Salami   E. Sweet  pickles J. Tomato Juice   O. Pizza  12.  Seasonings that may be used freely on a reduced - sodium diet include   A. Lemon wedges F.Monosodium glutamate K. Celery seed    B.Soysauce   G. Pepper   L. Mustard powder   C. Sea salt  H. Cooking wine  M. Onion flakes   D. Vinegar  E. Prepared horseradish N. Salsa   E. Sage   J. Worcestershire sauce  O. [address]      Sumner Boast, PA-C  02/02/2021 9:18 AM    [facility] [place], [place], [place]  [address] Phone: [phone]; Fax: [phone]

## 2021-01-26 ENCOUNTER — Telehealth: Payer: Self-pay | Admitting: Nurse Practitioner

## 2021-01-26 ENCOUNTER — Other Ambulatory Visit: Payer: Self-pay | Admitting: Nurse Practitioner

## 2021-01-26 MED ORDER — TRAMADOL HCL 50 MG PO TABS: 50.0000 mg | ORAL_TABLET | Freq: Four times a day (QID) | ORAL | 0 refills | Status: AC | PRN

## 2021-01-26 NOTE — Progress Notes (Signed)
Tramadol to be sent

## 2021-01-26 NOTE — Telephone Encounter (Signed)
Pt would like to have her pain medication this sent to [facility] [address] , [place]

## 2021-01-26 NOTE — Progress Notes (Signed)
   [facility] [place], [place]  [address] Phone:  [phone]   Fax:  [phone]  Tramadol sent

## 2021-01-26 NOTE — Telephone Encounter (Signed)
Patient wanted to know about some pain meds.

## 2021-01-27 ENCOUNTER — Ambulatory Visit: Payer: Medicare Other | Admitting: Obstetrics & Gynecology

## 2021-01-27 ENCOUNTER — Other Ambulatory Visit: Payer: Self-pay

## 2021-02-02 ENCOUNTER — Encounter: Payer: Self-pay | Admitting: Physician Assistant

## 2021-02-02 ENCOUNTER — Ambulatory Visit (INDEPENDENT_AMBULATORY_CARE_PROVIDER_SITE_OTHER): Payer: Medicare Other | Admitting: Physician Assistant

## 2021-02-02 ENCOUNTER — Other Ambulatory Visit: Payer: Self-pay

## 2021-02-02 VITALS — BP 130/80 | HR 100 | Ht 67.0 in | Wt 310.0 lb

## 2021-02-02 DIAGNOSIS — I5032 Chronic diastolic (congestive) heart failure: Secondary | ICD-10-CM | POA: Diagnosis not present

## 2021-02-02 DIAGNOSIS — E119 Type 2 diabetes mellitus without complications: Secondary | ICD-10-CM | POA: Diagnosis not present

## 2021-02-02 DIAGNOSIS — I110 Hypertensive heart disease with heart failure: Secondary | ICD-10-CM

## 2021-02-02 DIAGNOSIS — Z794 Long term (current) use of insulin: Secondary | ICD-10-CM

## 2021-02-02 DIAGNOSIS — Z01818 Encounter for other preprocedural examination: Secondary | ICD-10-CM

## 2021-02-02 DIAGNOSIS — J449 Chronic obstructive pulmonary disease, unspecified: Secondary | ICD-10-CM | POA: Diagnosis not present

## 2021-02-02 MED ORDER — CARVEDILOL 12.5 MG PO TABS
12.5000 mg | ORAL_TABLET | Freq: Two times a day (BID) | ORAL | 3 refills | Status: DC
  Filled 2021-02-02 – 2021-12-06 (×3): qty 180, 90d supply, fill #0

## 2021-02-02 NOTE — Patient Instructions (Signed)
Medication Instructions:  Your physician has recommended you make the following change in your medication:   INCREASE: Carvedilol to 12.5mg  twice daily INCREASE: Furosemide 80mg  twice daily for 3 days then 40mg  twice daily  *If you need a refill on your cardiac medications before your next appointment, please call your pharmacy*   Lab Work: TODAY; BNP, CMET, CBC  If you have labs (blood work) drawn today and your tests are completely normal, you will receive your results only by: Marland Kitchen MyChart Message (if you have MyChart) OR . A paper copy in the mail If you have any lab test that is abnormal or we need to change your treatment, we will call you to review the results.   Testing/Procedures: Your physician has requested that you have an echocardiogram. Echocardiography is a painless test that uses sound waves to create images of your heart. It provides your doctor with information about the size and shape of your heart and how well your heart's chambers and valves are working. This procedure takes approximately one hour. There are no restrictions for this procedure.   Follow-Up: At [facility], you and your health needs are our priority.  As part of our continuing mission to provide you with exceptional heart care, we have created designated Provider Care Teams.  These Care Teams include your primary Cardiologist (physician) and Advanced Practice Providers (APPs -  Physician Assistants and Nurse Practitioners) who all work together to provide you with the care you need, when you need it.  We recommend signing up for the patient portal called "MyChart".  Sign up information is provided on this After Visit Summary.  MyChart is used to connect with patients for Virtual Visits (Telemedicine).  Patients are able to view lab/test results, encounter notes, upcoming appointments, etc.  Non-urgent messages can be sent to your provider as well.   To learn more about what you can do with MyChart, go to  [email or web address].    Your next appointment:   1 year(s)  The format for your next appointment:   In Person  Provider:   You may see Larae Grooms, MD or one of the following Advanced Practice Providers on your designated Care Team:    Melina Copa, PA-C  Ermalinda Barrios, PA-C    Other Instructions Two Gram Sodium Diet 2000 mg  What is Sodium? Sodium is a mineral found naturally in many foods. The most significant source of sodium in the diet is table salt, which is about 40% sodium.  Processed, convenience, and preserved foods also contain a large amount of sodium.  The body needs only 500 mg of sodium daily to function,  A normal diet provides more than enough sodium even if you do not use salt.  Why Limit Sodium? A build up of sodium in the body can cause thirst, increased blood pressure, shortness of breath, and water retention.  Decreasing sodium in the diet can reduce edema and risk of heart attack or stroke associated with high blood pressure.  Keep in mind that there are many other factors involved in these health problems.  Heredity, obesity, lack of exercise, cigarette smoking, stress and what you eat all play a role.  General Guidelines:  Do not add salt at the table or in cooking.  One teaspoon of salt contains over 2 grams of sodium.  Read food labels  Avoid processed and convenience foods  Ask your dietitian before eating any foods not dicussed in the menu planning guidelines  Consult your physician if  you wish to use a salt substitute or a sodium containing medication such as antacids.  Limit milk and milk products to 16 oz (2 cups) per day.  Shopping Hints:  READ LABELS!! "Dietetic" does not necessarily mean low sodium.  Salt and other sodium ingredients are often added to foods during processing.   Menu Planning Guidelines Food Group Choose More Often Avoid  Beverages (see also the milk group All fruit juices, low-sodium, salt-free vegetables  juices, low-sodium carbonated beverages Regular vegetable or tomato juices, commercially softened water used for drinking or cooking  Breads and Cereals Enriched white, wheat, rye and pumpernickel bread, hard rolls and dinner rolls; muffins, cornbread and waffles; most dry cereals, cooked cereal without added salt; unsalted crackers and breadsticks; low sodium or homemade bread crumbs Bread, rolls and crackers with salted tops; quick breads; instant hot cereals; pancakes; commercial bread stuffing; self-rising flower and biscuit mixes; regular bread crumbs or cracker crumbs  Desserts and Sweets Desserts and sweets mad with mild should be within allowance Instant pudding mixes and cake mixes  Fats Butter or margarine; vegetable oils; unsalted salad dressings, regular salad dressings limited to 1 Tbs; light, sour and heavy cream Regular salad dressings containing bacon fat, bacon bits, and salt pork; snack dips made with instant soup mixes or processed cheese; salted nuts  Fruits Most fresh, frozen and canned fruits Fruits processed with salt or sodium-containing ingredient (some dried fruits are processed with sodium sulfites        Vegetables Fresh, frozen vegetables and low- sodium canned vegetables Regular canned vegetables, sauerkraut, pickled vegetables, and others prepared in brine; frozen vegetables in sauces; vegetables seasoned with ham, bacon or salt pork  Condiments, Sauces, Miscellaneous  Salt substitute with physician's approval; pepper, herbs, spices; vinegar, lemon or lime juice; hot pepper sauce; garlic powder, onion powder, low sodium soy sauce (1 Tbs.); low sodium condiments (ketchup, chili sauce, mustard) in limited amounts (1 tsp.) fresh ground horseradish; unsalted tortilla chips, pretzels, potato chips, popcorn, salsa (1/4 cup) Any seasoning made with salt including garlic salt, celery salt, onion salt, and seasoned salt; sea salt, rock salt, kosher salt; meat tenderizers;  monosodium glutamate; mustard, regular soy sauce, barbecue, sauce, chili sauce, teriyaki sauce, steak sauce, Worcestershire sauce, and most flavored vinegars; canned gravy and mixes; regular condiments; salted snack foods, olives, picles, relish, horseradish sauce, catsup   Food preparation: Try these seasonings Meats:    Pork Sage, onion Serve with applesauce  Chicken Poultry seasoning, thyme, parsley Serve with cranberry sauce  Lamb Curry powder, rosemary, garlic, thyme Serve with mint sauce or jelly  Veal Marjoram, basil Serve with current jelly, cranberry sauce  Beef Pepper, bay leaf Serve with dry mustard, unsalted chive butter  Fish Bay leaf, dill Serve with unsalted lemon butter, unsalted parsley butter  Vegetables:    Asparagus Lemon juice   Broccoli Lemon juice   Carrots Mustard dressing parsley, mint, nutmeg, glazed with unsalted butter and sugar   Green beans Marjoram, lemon juice, nutmeg,dill seed   Tomatoes Basil, marjoram, onion   Spice /blend for [employer]" 4 tsp ground thyme 1 tsp ground sage 3 tsp ground rosemary 4 tsp ground marjoram   Test your knowledge 1. A product that says "Salt Free" may still contain sodium. True or False 2. Garlic Powder and Hot Pepper Sauce an be used as alternative seasonings.True or False 3. Processed foods have more sodium than fresh foods.  True or False 4. Canned Vegetables have less sodium than froze True or  False  WAYS TO DECREASE YOUR SODIUM INTAKE 1. Avoid the use of added salt in cooking and at the table.  Table salt (and other prepared seasonings which contain salt) is probably one of the greatest sources of sodium in the diet.  Unsalted foods can gain flavor from the sweet, sour, and butter taste sensations of herbs and spices.  Instead of using salt for seasoning, try the following seasonings with the foods listed.  Remember: how you use them to enhance natural food flavors is limited only by your creativity... Allspice-Meat,  fish, eggs, fruit, peas, red and yellow vegetables Almond Extract-Fruit baked goods Anise Seed-Sweet breads, fruit, carrots, beets, cottage cheese, cookies (tastes like licorice) Basil-Meat, fish, eggs, vegetables, rice, vegetables salads, soups, sauces Bay Leaf-Meat, fish, stews, poultry Burnet-Salad, vegetables (cucumber-like flavor) Caraway Seed-Bread, cookies, cottage cheese, meat, vegetables, cheese, rice Cardamon-Baked goods, fruit, soups Celery Powder or seed-Salads, salad dressings, sauces, meatloaf, soup, bread.Do not use  celery salt Chervil-Meats, salads, fish, eggs, vegetables, cottage cheese (parsley-like flavor) Chili Power-Meatloaf, chicken cheese, corn, eggplant, egg dishes Chives-Salads cottage cheese, egg dishes, soups, vegetables, sauces Cilantro-Salsa, casseroles Cinnamon-Baked goods, fruit, pork, lamb, chicken, carrots Cloves-Fruit, baked goods, fish, pot roast, green beans, beets, carrots Coriander-Pastry, cookies, meat, salads, cheese (lemon-orange flavor) Cumin-Meatloaf, fish,cheese, eggs, cabbage,fruit pie (caraway flavor) [employer], fruit, eggs, fish, poultry, cottage cheese, vegetables Dill Seed-Meat, cottage cheese, poultry, vegetables, fish, salads, bread Fennel Seed-Bread, cookies, apples, pork, eggs, fish, beets, cabbage, cheese, Licorice-like flavor Garlic-(buds or powder) Salads, meat, poultry, fish, bread, butter, vegetables, potatoes.Do not  use garlic salt Ginger-Fruit, vegetables, baked goods, meat, fish, poultry Horseradish Root-Meet, vegetables, butter Lemon Juice or Extract-Vegetables, fruit, tea, baked goods, fish salads Mace-Baked goods fruit, vegetables, fish, poultry (taste like nutmeg) Maple Extract-Syrups Marjoram-Meat, chicken, fish, vegetables, breads, green salads (taste like Sage) Mint-Tea, lamb, sherbet, vegetables, desserts, carrots, cabbage Mustard, Dry or Seed-Cheese, eggs, meats, vegetables, poultry Nutmeg-Baked goods, fruit,  chicken, eggs, vegetables, desserts Onion Powder-Meat, fish, poultry, vegetables, cheese, eggs, bread, rice salads (Do not use   Onion salt) Orange Extract-Desserts, baked goods Oregano-Pasta, eggs, cheese, onions, pork, lamb, fish, chicken, vegetables, green salads Paprika-Meat, fish, poultry, eggs, cheese, vegetables Parsley Flakes-Butter, vegetables, meat fish, poultry, eggs, bread, salads (certain forms may   Contain sodium Pepper-Meat fish, poultry, vegetables, eggs Peppermint Extract-Desserts, baked goods Poppy Seed-Eggs, bread, cheese, fruit dressings, baked goods, noodles, vegetables, cottage  [employer], poultry, meat, fish, cauliflower, turnips,eggs bread Saffron-Rice, bread, veal, chicken, fish, eggs Sage-Meat, fish, poultry, onions, eggplant, tomateos, pork, stews Savory-Eggs, salads, poultry, meat, rice, vegetables, soups, pork Tarragon-Meat, poultry, fish, eggs, butter, vegetables (licorice-like flavor)  Thyme-Meat, poultry, fish, eggs, vegetables, (clover-like flavor), sauces, soups Tumeric-Salads, butter, eggs, fish, rice, vegetables (saffron-like flavor) Vanilla Extract-Baked goods, candy Vinegar-Salads, vegetables, meat marinades Walnut Extract-baked goods, candy  2. Choose your Foods Wisely   The following is a list of foods to avoid which are high in sodium:  Meats-Avoid all smoked, canned, salt cured, dried and kosher meat and fish as well as Anchovies   [employer] Rx meats:Bologna, Liverwurst, Pastrami Canned meat or fish  Marinated herring Caviar    Pepperoni Corned Beef   Pizza Dried chipped beef  Salami Frozen breaded fish or meat Salt pork Frankfurters or hot dogs  Sardines Gefilte fish   Sausage Ham (boiled ham, Proscuitto Smoked butt    spiced ham)   Spam      TV Dinners Vegetables Canned vegetables (Regular) Relish Canned mushrooms  Sauerkraut Olives  Tomato juice Pickles  Bakery and Dessert  Products Canned puddings  Cream pies Cheesecake   Decorated cakes Cookies  Beverages/Juices Tomato juice, regular  Gatorade   V-8 vegetable juice, regular  Breads and Cereals Biscuit mixes   Salted potato chips, corn chips, pretzels Bread stuffing mixes  Salted crackers and rolls Pancake and waffle mixes Self-rising flour  Seasonings Accent    Meat sauces Barbecue sauce  Meat tenderizer Catsup    Monosodium glutamate (MSG) Celery salt   Onion salt Chili sauce   Prepared mustard Garlic salt   Salt, seasoned salt, sea salt Gravy mixes   Soy sauce Horseradish   Steak sauce Ketchup   Tartar sauce Lite salt    Teriyaki sauce Marinade mixes   Worcestershire sauce  Others Baking powder   Cocoa and cocoa mixes Baking soda   Commercial casserole mixes Candy-caramels, chocolate  Dehydrated soups    Bars, fudge,nougats  Instant rice and pasta mixes Canned broth or soup  Maraschino cherries Cheese, aged and processed cheese and cheese spreads  Learning Assessment Quiz  Indicated T (for True) or F (for False) for each of the following statements:  1. _____ Fresh fruits and vegetables and unprocessed grains are generally low in sodium 2. _____ Water may contain a considerable amount of sodium, depending on the source 3. _____ You can always tell if a food is high in sodium by tasting it 4. _____ Certain laxatives my be high in sodium and should be avoided unless prescribed   by a physician or pharmacist 5. _____ Salt substitutes may be used freely by anyone on a sodium restricted diet 6. _____ Sodium is present in table salt, food additives and as a natural component of   most foods 7. _____ Table salt is approximately 90% sodium 8. _____ Limiting sodium intake may help prevent excess fluid accumulation in the body 9. _____ On a sodium-restricted diet, seasonings such as bouillon soy sauce, and    cooking wine should be used in place of table salt 10. _____ On an ingredient list, a  product which lists monosodium glutamate as the first   ingredient is an appropriate food to include on a low sodium diet  Circle the best answer(s) to the following statements (Hint: there may be more than one correct answer)  11. On a low-sodium diet, some acceptable snack items are:    A. Olives  F. Bean dip   K. Grapefruit juice    B. Salted Pretzels G. Commercial Popcorn   L. Canned peaches    C. Carrot Sticks  H. Bouillon   M. Unsalted nuts   D. [place] fries  I. Peanut butter crackers N. Salami   E. Sweet pickles J. Tomato Juice   O. Pizza  12.  Seasonings that may be used freely on a reduced - sodium diet include   A. Lemon wedges F.Monosodium glutamate K. Celery seed    B.Soysauce   G. Pepper   L. Mustard powder   C. Sea salt  H. Cooking wine  M. Onion flakes   D. Vinegar  E. Prepared horseradish N. Salsa   E. Sage   J. Worcestershire sauce  O. Chutney

## 2021-02-03 LAB — CBC
Hematocrit: 39 % (ref 34.0–46.6)
Hemoglobin: 12.8 g/dL (ref 11.1–15.9)
MCH: 28.1 pg (ref 26.6–33.0)
MCHC: 32.8 g/dL (ref 31.5–35.7)
MCV: 86 fL (ref 79–97)
Platelets: 264 10*3/uL (ref 150–450)
RBC: 4.55 x10E6/uL (ref 3.77–5.28)
RDW: 13.1 % (ref 11.7–15.4)
WBC: 5.5 10*3/uL (ref 3.4–10.8)

## 2021-02-03 LAB — COMPREHENSIVE METABOLIC PANEL
ALT: 27 IU/L (ref 0–32)
AST: 14 IU/L (ref 0–40)
Albumin/Globulin Ratio: 1.6 (ref 1.2–2.2)
Albumin: 4.3 g/dL (ref 3.8–4.9)
Alkaline Phosphatase: 86 IU/L (ref 44–121)
BUN/Creatinine Ratio: 18 (ref 12–28)
BUN: 14 mg/dL (ref 8–27)
Bilirubin Total: 0.3 mg/dL (ref 0.0–1.2)
CO2: 32 mmol/L — ABNORMAL HIGH (ref 20–29)
Calcium: 9.7 mg/dL (ref 8.7–10.3)
Chloride: 96 mmol/L (ref 96–106)
Creatinine, Ser: 0.78 mg/dL (ref 0.57–1.00)
Globulin, Total: 2.7 g/dL (ref 1.5–4.5)
Glucose: 105 mg/dL — ABNORMAL HIGH (ref 65–99)
Potassium: 3.7 mmol/L (ref 3.5–5.2)
Sodium: 142 mmol/L (ref 134–144)
Total Protein: 7 g/dL (ref 6.0–8.5)
eGFR: 87 mL/min/{1.73_m2} (ref >59)

## 2021-02-03 LAB — PRO B NATRIURETIC PEPTIDE: NT-Pro BNP: 11 pg/mL (ref 0–287)

## 2021-02-07 ENCOUNTER — Telehealth: Payer: Self-pay

## 2021-02-07 ENCOUNTER — Other Ambulatory Visit: Payer: Self-pay

## 2021-02-07 NOTE — Telephone Encounter (Signed)
Tried to called patient wasn't able to lvm

## 2021-02-07 NOTE — Telephone Encounter (Signed)
Pt was asking about her referral to neurology

## 2021-02-09 ENCOUNTER — Other Ambulatory Visit: Payer: Self-pay

## 2021-02-10 ENCOUNTER — Other Ambulatory Visit: Payer: Self-pay

## 2021-02-10 MED FILL — Dulaglutide Soln Auto-injector 0.75 MG/0.5ML: SUBCUTANEOUS | 28 days supply | Qty: 2 | Fill #1 | Status: AC

## 2021-02-10 MED FILL — Insulin Aspart Inj Soln 100 Unit/ML: INTRAMUSCULAR | 30 days supply | Qty: 20 | Fill #0 | Status: CN

## 2021-02-10 MED FILL — Dulaglutide Soln Auto-injector 0.75 MG/0.5ML: SUBCUTANEOUS | 28 days supply | Qty: 2 | Fill #1 | Status: CN

## 2021-02-11 ENCOUNTER — Other Ambulatory Visit: Payer: Self-pay

## 2021-02-18 ENCOUNTER — Other Ambulatory Visit: Payer: Self-pay

## 2021-02-24 ENCOUNTER — Other Ambulatory Visit: Payer: Self-pay

## 2021-02-24 ENCOUNTER — Ambulatory Visit (HOSPITAL_COMMUNITY): Payer: Medicare Other | Attending: Cardiology

## 2021-02-24 DIAGNOSIS — I5032 Chronic diastolic (congestive) heart failure: Secondary | ICD-10-CM | POA: Insufficient documentation

## 2021-02-24 LAB — ECHOCARDIOGRAM COMPLETE
Area-P 1/2: 5.84 cm2
S' Lateral: 3.5 cm

## 2021-02-24 MED ORDER — PERFLUTREN LIPID MICROSPHERE
1.0000 mL | INTRAVENOUS | Status: AC | PRN
  Administered 2021-02-24: 1 mL via INTRAVENOUS

## 2021-03-03 ENCOUNTER — Telehealth: Payer: Self-pay

## 2021-03-03 ENCOUNTER — Other Ambulatory Visit: Payer: Self-pay | Admitting: Nurse Practitioner

## 2021-03-03 DIAGNOSIS — M48061 Spinal stenosis, lumbar region without neurogenic claudication: Secondary | ICD-10-CM

## 2021-03-03 DIAGNOSIS — M5126 Other intervertebral disc displacement, lumbar region: Secondary | ICD-10-CM

## 2021-03-03 MED ORDER — TRAMADOL HCL 50 MG PO TABS: 50.0000 mg | ORAL_TABLET | Freq: Four times a day (QID) | ORAL | 0 refills | Status: DC | PRN

## 2021-03-03 NOTE — Progress Notes (Unsigned)
   [facility] [address] [place], [place]  [address] Phone:  [phone]   Fax:  [phone] 

## 2021-03-03 NOTE — Telephone Encounter (Signed)
Med refill  Trimadol for back pain

## 2021-03-08 DIAGNOSIS — J449 Chronic obstructive pulmonary disease, unspecified: Secondary | ICD-10-CM | POA: Diagnosis not present

## 2021-03-10 ENCOUNTER — Other Ambulatory Visit: Payer: Self-pay | Admitting: Nurse Practitioner

## 2021-03-10 MED ORDER — TRAMADOL HCL 50 MG PO TABS: 50.0000 mg | ORAL_TABLET | Freq: Four times a day (QID) | ORAL | 0 refills | Status: DC | PRN

## 2021-03-11 DIAGNOSIS — G4733 Obstructive sleep apnea (adult) (pediatric): Secondary | ICD-10-CM | POA: Diagnosis not present

## 2021-03-11 DIAGNOSIS — J449 Chronic obstructive pulmonary disease, unspecified: Secondary | ICD-10-CM | POA: Diagnosis not present

## 2021-03-17 ENCOUNTER — Other Ambulatory Visit: Payer: Self-pay

## 2021-03-17 ENCOUNTER — Ambulatory Visit (INDEPENDENT_AMBULATORY_CARE_PROVIDER_SITE_OTHER): Payer: Medicare Other | Admitting: Nurse Practitioner

## 2021-03-17 ENCOUNTER — Encounter: Payer: Self-pay | Admitting: Nurse Practitioner

## 2021-03-17 ENCOUNTER — Other Ambulatory Visit: Payer: Self-pay | Admitting: Nurse Practitioner

## 2021-03-17 VITALS — BP 173/86 | HR 95 | Temp 97.4°F | Ht 67.0 in | Wt 311.0 lb

## 2021-03-17 DIAGNOSIS — J45909 Unspecified asthma, uncomplicated: Secondary | ICD-10-CM | POA: Diagnosis not present

## 2021-03-17 DIAGNOSIS — M48061 Spinal stenosis, lumbar region without neurogenic claudication: Secondary | ICD-10-CM

## 2021-03-17 DIAGNOSIS — E1165 Type 2 diabetes mellitus with hyperglycemia: Secondary | ICD-10-CM

## 2021-03-17 DIAGNOSIS — Z720 Tobacco use: Secondary | ICD-10-CM | POA: Diagnosis not present

## 2021-03-17 DIAGNOSIS — Z794 Long term (current) use of insulin: Secondary | ICD-10-CM

## 2021-03-17 DIAGNOSIS — R600 Localized edema: Secondary | ICD-10-CM

## 2021-03-17 DIAGNOSIS — Z1211 Encounter for screening for malignant neoplasm of colon: Secondary | ICD-10-CM | POA: Diagnosis not present

## 2021-03-17 DIAGNOSIS — I10 Essential (primary) hypertension: Secondary | ICD-10-CM

## 2021-03-17 LAB — POCT GLYCOSYLATED HEMOGLOBIN (HGB A1C)
HbA1c POC (<> result, manual entry): 6.4 % (ref 4.0–5.6)
HbA1c, POC (controlled diabetic range): 6.4 % (ref 0.0–7.0)
HbA1c, POC (prediabetic range): 6.4 % (ref 5.7–6.4)
Hemoglobin A1C: 6.4 % — AB (ref 4.0–5.6)

## 2021-03-17 LAB — GLUCOSE, POCT (MANUAL RESULT ENTRY): POC Glucose: 107 mg/dl — AB (ref 70–99)

## 2021-03-17 MED ORDER — FREESTYLE LIBRE 2 SENSOR MISC
1.0000 "application " | 11 refills | Status: DC
  Filled 2021-03-17: qty 2, fill #0
  Filled 2021-03-18: qty 2, 28d supply, fill #0

## 2021-03-17 MED ORDER — TRAMADOL HCL 50 MG PO TABS: 50.0000 mg | ORAL_TABLET | Freq: Four times a day (QID) | ORAL | 0 refills | Status: AC | PRN

## 2021-03-17 MED ORDER — FREESTYLE LIBRE 2 READER DEVI
1.0000 "application " | Freq: Every day | 1 refills | Status: DC
  Filled 2021-03-17 – 2021-03-18 (×2): qty 1, 30d supply, fill #0

## 2021-03-17 MED ORDER — INSULIN ASPART 100 UNIT/ML IJ SOLN
20.0000 [IU] | Freq: Three times a day (TID) | INTRAMUSCULAR | 3 refills | Status: DC
  Filled 2021-03-17: qty 10, 17d supply, fill #0

## 2021-03-17 NOTE — Progress Notes (Signed)
Sharon Swanson, [place]  [id number] Phone:  [phone]   Fax:  [phone]   Established Patient Office Visit  Subjective:  Patient ID: Sharon Swanson, female    DOB: 07/22/1961  Age: 60 y.o. MRN: [id number]  CC:  Chief Complaint  Patient presents with   Follow-up    Back pain, requesting refill Tramadol, entresto,  Needing new order for [facility]    HPI Sharon Swanson presents for follow up. She  has a past medical history of Abnormal Pap smear of cervix (10/2020), Acid reflux, Asthma, Atypical squamous cells of undetermined significance (ASC-US) on cervical Pap smear (10/2020), Bilateral lower extremity edema (01/2020), COPD (chronic obstructive pulmonary disease) (HCC), CPAP (continuous positive airway pressure) dependence, Diabetes ([place]), Diabetes mellitus without complication ([place]), Fatty liver (11/08/2011), Fibroids (11/08/2011), Hyperglycemia, Hyperlipidemia (05/2020), Hypertension, Microalbuminuria (01/2020), Obesity (BMI 30-39.9) (11/08/2011), Osteoarthritis of right acromioclavicular joint, Pancreatitis (11/08/2011), Rotator cuff tear, right, Shortness of breath on exertion (01/2020), Sleep apnea, Ventral hernia, and Vitamin D deficiency (05/2020).   Diabetes Mellitus Patient presents for follow up of diabetes. Current symptoms include: none. Symptoms have stabilized. Patient denies foot ulcerations, nausea, polydipsia, polyuria, and vomiting. Evaluation to date has included: fasting blood sugar and hemoglobin A1C.  Home sugars: BGs have been labile ranging between 69 and 170 . Current treatment: Continued insulin which has been effective, Continued sulfonylurea which has been effective, Continued metformin which has been effective, and Continued statin which has been effective.  Continue Trulicity effective.  Continue Entresto somewhat effective last dilated eye exam: not completed    Past Medical History:  Diagnosis Date   Abnormal  Pap smear of cervix 10/2020   Acid reflux    Asthma    Atypical squamous cells of undetermined significance (ASC-US) on cervical Pap smear 10/2020   Bilateral lower extremity edema 01/2020   COPD (chronic obstructive pulmonary disease) (HCC)    CPAP (continuous positive airway pressure) dependence    Diabetes ([place])    Diabetes mellitus without complication ([place])    Type II   Fatty liver 11/08/2011   Fibroids 11/08/2011   Hyperglycemia    Hyperlipidemia 05/2020   Hypertension    Microalbuminuria 01/2020   Obesity (BMI 30-39.9) 11/08/2011   Osteoarthritis of right acromioclavicular joint    Pancreatitis 11/08/2011   Rotator cuff tear, right    Shortness of breath on exertion 01/2020   Sleep apnea    Ventral hernia    Vitamin D deficiency 05/2020    Past Surgical History:  Procedure Laterality Date   ORTHOPEDIC SURGERY     R ankle, tendonitis    Family History  Problem Relation Age of Onset   Hypertension Mother    Diabetes Other    Diabetes Maternal Grandmother    Hypertension Maternal Grandmother     Social History   Socioeconomic History   Marital status: Married    Spouse name: Not on file   Number of children: Not on file   Years of education: Not on file   Highest education level: Not on file  Occupational History   Not on file  Tobacco Use   Smoking status: Every Day    Packs/day: 0.50    Years: 39.00    Pack years: 19.50    Types: Cigarettes    Start date: 09/25/1980   Smokeless tobacco: Never   Tobacco comments:    pt states she smoke 2 cigerettes a day  Vaping Use  Vaping Use: Never used  Substance and Sexual Activity   Alcohol use: No   Drug use: No   Sexual activity: Not Currently    Partners: Male  Other Topics Concern   Not on file  Social History Narrative   Not on file   Social Determinants of Health   Financial Resource Strain: Not on file  Food Insecurity: Not on file  Transportation Needs: Not on file  Physical Activity: Not on  file  Stress: Not on file  Social Connections: Not on file  Intimate Partner Violence: Not on file    Outpatient Medications Prior to Visit  Medication Sig Dispense Refill   albuterol (VENTOLIN HFA) 108 (90 Base) MCG/ACT inhaler USE 2 INHALATIONS BY MOUTH  INTO THE LUNGS EVERY 4  HOURS AS NEEDED FOR  WHEEZING OR SHORTNESS OF  BREATH 51 g 3   Alcohol Swabs (B-D SINGLE USE SWABS REGULAR) PADS SMARTSIG:1 [place](s) Topical 3 Times Daily     atorvastatin (LIPITOR) 20 MG tablet Take 1 tablet (20 mg total) by mouth daily. 90 tablet 3   carvedilol (COREG) 12.5 MG tablet Take 1 tablet (12.5 mg total) by mouth 2 (two) times daily. 180 tablet 3   cetirizine (ZYRTEC) 10 MG tablet Take 1 tablet (10 mg total) by mouth in the morning. 90 tablet 3   docusate sodium (COLACE) 100 MG capsule Take 1 capsule (100 mg total) by mouth 3 (three) times daily as needed for mild constipation. 270 capsule 3   Dulaglutide 0.75 MG/0.5ML SOPN INJECT 0.75 MG INTO THE SKIN ONCE A WEEK. 2 mL 11   ENTRESTO 97-103 MG Take 1 tablet by mouth 2 (two) times daily. 180 tablet 3   fluticasone (FLONASE) 50 MCG/ACT nasal spray Place 1 spray into both nostrils daily. 30 g 11   furosemide (LASIX) 40 MG tablet Take 1 tablet (40 mg total) by mouth 2 (two) times daily. 180 tablet 3   gabapentin (NEURONTIN) 300 MG capsule Take 1 capsule (300 mg total) by mouth 3 (three) times daily. 270 capsule 3   glipiZIDE (GLUCOTROL) 10 MG tablet Take 1 tablet (10 mg total) by mouth 2 (two) times daily before a meal. 180 tablet 3   glucose monitoring kit (FREESTYLE) monitoring kit 1 each by Does not apply route as needed for other. 1 each 0   insulin glargine (LANTUS) 100 UNIT/ML injection Inject 0.6 mLs (60 Units total) into the skin daily. 30 mL 3   ipratropium-albuterol (DUONEB) 0.5-2.5 (3) MG/3ML SOLN Take 3 mLs by nebulization every 6 (six) hours as needed (shortness of breath, wheeze). 360 mL 12   metFORMIN (GLUCOPHAGE) 1000 MG tablet Take 1 tablet  (1,000 mg total) by mouth 2 (two) times daily with a meal. 180 tablet 3   methocarbamol (ROBAXIN) 750 MG tablet Take 1 tablet (750 mg total) by mouth 3 (three) times daily as needed for muscle spasms. 270 tablet 3   Misc. Devices (BARIATRIC ROLLATOR) MISC 1 Device by Does not apply route daily. 1 each 0   montelukast (SINGULAIR) 10 MG tablet Take 1 tablet (10 mg total) by mouth at bedtime. 90 tablet 3   nicotine (NICODERM CQ - DOSED IN MG/24 HOURS) 21 mg/24hr patch Place 1 patch (21 mg total) onto the skin daily. 28 patch 0   pantoprazole (PROTONIX) 40 MG tablet Take 1 tablet (40 mg total) by mouth daily. 90 tablet 3   spironolactone (ALDACTONE) 25 MG tablet TAKE 1 TABLET (25 MG TOTAL) BY MOUTH DAILY. [place]  tablet 6   Tiotropium Bromide-Olodaterol (STIOLTO RESPIMAT) 2.5-2.5 MCG/ACT AERS Inhale 2 puffs into the lungs daily. 12 g 3   varenicline (CHANTIX) 0.5 MG tablet Take 1 tablet (0.5 mg total) by mouth 2 (two) times daily. 60 tablet 3   Vitamin D, Ergocalciferol, (DRISDOL) 1.25 MG (50000 UNIT) CAPS capsule TAKE 1 CAPSULE (50,000 UNITS TOTAL) BY MOUTH EVERY 7 (SEVEN) DAYS. 5 capsule 6   Blood Glucose Calibration (TRUE METRIX LEVEL 1) Low SOLN      insulin aspart (NOVOLOG) 100 UNIT/ML injection INJECT 20 UNIT(S) UNDER THE SKIN THREE TIMES DAILY BEFORE MEALS 120 mL 11   traMADol (ULTRAM) 50 MG tablet Take 1 tablet (50 mg total) by mouth every 6 (six) hours as needed for severe pain. 30 tablet 0   Blood Glucose Monitoring Suppl (TRUE METRIX METER) w/Device KIT USE AS DIRECTED 1 kit 0   Continuous Blood Gluc Receiver (DEXCOM G6 RECEIVER) DEVI Use as directed 1 each 1   Continuous Blood Gluc Sensor (DEXCOM G6 SENSOR) MISC Apply as directed 9 each 3   Continuous Blood Gluc Transmit (DEXCOM G6 TRANSMITTER) MISC Use as directed 6 each 3   insulin lispro (HUMALOG) 100 UNIT/ML injection Inject 0.3 mLs (30 Units total) into the skin 3 (three) times daily before meals. 90 mL 3   predniSONE (DELTASONE) 20 MG  tablet TAKE 2 TABLETS (40MG) BY MOUTH DAILY FOR 5 DAYS (Patient taking differently: TAKE 2 TABLETS (40MG) BY MOUTH DAILY FOR 5 DAYS) 10 tablet 0   TRUE METRIX BLOOD GLUCOSE TEST test strip 1 each 3 (three) times daily.     TRUEplus Lancets 33G MISC Apply 1 each topically 3 (three) times daily.     No facility-administered medications prior to visit.    Allergies  Allergen Reactions   Aspirin Other (See Comments)    Abdominal pain    ROS Review of Systems  Neurological:  Positive for dizziness (occasional with standing).     Objective:    Physical Exam Constitutional:      Appearance: She is obese.  HENT:     Head: Normocephalic and atraumatic.     Nose: Nose normal.     Mouth/Throat:     Mouth: Mucous membranes are moist.  Cardiovascular:     Rate and Rhythm: Normal rate.     Pulses: Normal pulses.     Heart sounds: Normal heart sounds.  Pulmonary:     Effort: Pulmonary effort is normal.     Breath sounds: Normal breath sounds.     Comments: 2.5 L  Abdominal:     Palpations: Abdomen is soft.  Musculoskeletal:        General: Normal range of motion.     Cervical back: Normal range of motion.     Right lower leg: Edema present.     Left lower leg: Edema present.  Skin:    General: Skin is warm and dry.     Capillary Refill: Capillary refill takes less than 2 seconds.  Neurological:     General: No focal deficit present.     Mental Status: She is alert and oriented to person, place, and time.  Psychiatric:        Mood and Affect: Mood normal.        Behavior: Behavior normal.        Thought Content: Thought content normal.        Judgment: Judgment normal.    BP (!) 173/86   Pulse 95   Temp Marland Kitchen)  97.4 F (36.3 C)   Ht 5' 7"  (1.702 m)   Wt (!) 311 lb 0.2 oz (141.1 kg)   SpO2 100%   BMI 48.71 kg/m  Wt Readings from Last 3 Encounters:  03/17/21 (!) 311 lb 0.2 oz (141.1 kg)  02/02/21 (!) 310 lb (140.6 kg)  12/30/20 (!) 316 lb 9.6 oz (143.6 kg)      Health Maintenance Due  Topic Date Due   MAMMOGRAM  Never done    There are no preventive care reminders to display for this patient.  Lab Results  Component Value Date   TSH 1.060 10/04/2020   Lab Results  Component Value Date   WBC 5.5 02/02/2021   HGB 12.8 02/02/2021   HCT 39.0 02/02/2021   MCV 86 02/02/2021   PLT 264 02/02/2021   Lab Results  Component Value Date   NA 142 02/02/2021   K 3.7 02/02/2021   CO2 32 (H) 02/02/2021   GLUCOSE 105 (H) 02/02/2021   BUN 14 02/02/2021   CREATININE 0.78 02/02/2021   BILITOT 0.3 02/02/2021   ALKPHOS 86 02/02/2021   AST 14 02/02/2021   ALT 27 02/02/2021   PROT 7.0 02/02/2021   ALBUMIN 4.3 02/02/2021   CALCIUM 9.7 02/02/2021   ANIONGAP 9 08/29/2020   EGFR 87 02/02/2021   GFR 128.72 03/01/2020   Lab Results  Component Value Date   CHOL 171 10/04/2020   Lab Results  Component Value Date   HDL 60 10/04/2020   Lab Results  Component Value Date   LDLCALC 90 10/04/2020   Lab Results  Component Value Date   TRIG 121 10/04/2020   Lab Results  Component Value Date   CHOLHDL 2.9 10/04/2020   Lab Results  Component Value Date   HGBA1C 6.4 (A) 03/17/2021   HGBA1C 6.4 03/17/2021   HGBA1C 6.4 03/17/2021   HGBA1C 6.4 03/17/2021      Assessment & Plan:   Problem List Items Addressed This Visit       Cardiovascular and Mediastinum   Essential hypertension Uncontrolled We will continue with Entresto and spironolactone encourage patient to monitor BP at home and inform of readings for possible dose adjustment      Other   Bilateral lower extremity edema Persistent however improving continue to follow-up with vascular as scheduled   Other Visit Diagnoses     Type 2 diabetes mellitus with hyperglycemia, with long-term current use of insulin (HCC)    -  Primary Controlled current A1c 6.4% Encourage compliance with current treatment regimen Encourage regular CBG monitoring Encourage contacting office if  excessive hyperglycemia and or hypoglycemia Lifestyle modification with healthy diet (fewer calories, more high fiber foods, whole grains and non-starchy vegetables, lower fat meat and fish, low-fat diary include healthy oils) regular exercise (physical activity) and weight loss Opthalmology exam discussed  Nutritional consult recommended Regular dental visits encouraged Home BP monitoring also encouraged goal <130/80    Relevant Medications   insulin aspart (NOVOLOG) 100 UNIT/ML injection   Other Relevant Orders   HgB A1c (Completed)   Glucose (CBG) (Completed)   Tobacco use       Moderate asthma without complication, unspecified whether persistent     Persistent with COPD currently on O2 managed   Spinal stenosis of lumbar region, unspecified whether neurogenic claudication present     Worsening Referral to neurosurgeon previously placed awaiting appointment Currently on tramadol made aware that primary care office does not prescribe chronic pain medication   Relevant Orders  Ambulatory referral to Pain Clinic   Screening for colon cancer       Relevant Orders   Ambulatory referral to Gastroenterology       Meds ordered this encounter  Medications   Continuous Blood Gluc Receiver (FREESTYLE LIBRE 2 READER) DEVI    Sig: Use as directed daily.    Dispense:  1 each    Refill:  1    Order Specific Question:   Supervising Provider    Answer:   Tresa Garter [[id number]]   Continuous Blood Gluc Sensor (FREESTYLE LIBRE 2 SENSOR) MISC    Sig: 1 application by Does not apply route every 14 (fourteen) days.    Dispense:  2 each    Refill:  11    Order Specific Question:   Supervising Provider    Answer:   Tresa Garter [[id number]]   traMADol (ULTRAM) 50 MG tablet    Sig: Take 1 tablet (50 mg total) by mouth every 6 (six) hours as needed for severe pain.    Dispense:  30 tablet    Refill:  0    Order Specific Question:   Supervising Provider    Answer:   Tresa Garter [[id number]]   insulin aspart (NOVOLOG) 100 UNIT/ML injection    Sig: Inject 20 Units into the skin 3 (three) times daily with meals.    Dispense:  54 mL    Refill:  3    Order Specific Question:   Supervising Provider    Answer:   Tresa Garter [[id number]]    Follow-up: Return in about 3 months (around 06/17/2021).    Vevelyn Francois, NP

## 2021-03-17 NOTE — Patient Instructions (Addendum)
Diabetes Mellitus and Nutrition, Adult When you have diabetes, or diabetes mellitus, it is very important to have healthy eating habits because your blood sugar (glucose) levels are greatly affected by what you eat and drink. Eating healthy foods in the right amounts, at about the same times every day, can help you: Control your blood glucose. Lower your risk of heart disease. Improve your blood pressure. Reach or maintain a healthy weight. What can affect my meal plan? Every person with diabetes is different, and each person has different needs for a meal plan. Your health care provider may recommend that you work with a dietitian to make a meal plan that is best for you. Your meal plan may vary depending on factors such as: The calories you need. The medicines you take. Your weight. Your blood glucose, blood pressure, and cholesterol levels. Your activity level. Other health conditions you have, such as heart or kidney disease. How do carbohydrates affect me? Carbohydrates, also called carbs, affect your blood glucose level more than any other type of food. Eating carbs naturally raises the amount of glucose in your blood. Carb counting is a method for keeping track of how many carbs you eat. Counting carbs is important to keep your blood glucose at a healthy level,especially if you use insulin or take certain oral diabetes medicines. It is important to know how many carbs you can safely have in each meal. This is different for every person. Your dietitian can help you calculate how manycarbs you should have at each meal and for each snack. How does alcohol affect me? Alcohol can cause a sudden decrease in blood glucose (hypoglycemia), especially if you use insulin or take certain oral diabetes medicines. Hypoglycemia can be a life-threatening condition. Symptoms of hypoglycemia, such as sleepiness, dizziness, and confusion, are similar to symptoms of having too much alcohol. Do not drink  alcohol if: Your health care provider tells you not to drink. You are pregnant, may be pregnant, or are planning to become pregnant. If you drink alcohol: Do not drink on an empty stomach. Limit how much you use to: 0-1 drink a day for women. 0-2 drinks a day for men. Be aware of how much alcohol is in your drink. In the [place] one drink equals one 12 oz bottle of beer (355 mL), one 5 oz glass of wine (148 mL), or one 1 oz glass of hard liquor (44 mL). Keep yourself hydrated with water, diet soda, or unsweetened iced tea. Keep in mind that regular soda, juice, and other mixers may contain a lot of sugar and must be counted as carbs. What are tips for following this plan?  Reading food labels Start by checking the serving size on the "Nutrition Facts" label of packaged foods and drinks. The amount of calories, carbs, fats, and other nutrients listed on the label is based on one serving of the item. Many items contain more than one serving per package. Check the total grams (g) of carbs in one serving. You can calculate the number of servings of carbs in one serving by dividing the total carbs by 15. For example, if a food has 30 g of total carbs per serving, it would be equal to 2 servings of carbs. Check the number of grams (g) of saturated fats and trans fats in one serving. Choose foods that have a low amount or none of these fats. Check the number of milligrams (mg) of salt (sodium) in one serving. Most people should limit total  sodium intake to less than 2,300 mg per day. Always check the nutrition information of foods labeled as "low-fat" or "nonfat." These foods may be higher in added sugar or refined carbs and should be avoided. Talk to your dietitian to identify your daily goals for nutrients listed on the label. Shopping Avoid buying canned, pre-made, or processed foods. These foods tend to be high in fat, sodium, and added sugar. Shop around the outside edge of the grocery store. This  is where you will most often find fresh fruits and vegetables, bulk grains, fresh meats, and fresh dairy. Cooking Use low-heat cooking methods, such as baking, instead of high-heat cooking methods like deep frying. Cook using healthy oils, such as olive, canola, or sunflower oil. Avoid cooking with butter, cream, or high-fat meats. Meal planning Eat meals and snacks regularly, preferably at the same times every day. Avoid going long periods of time without eating. Eat foods that are high in fiber, such as fresh fruits, vegetables, beans, and whole grains. Talk with your dietitian about how many servings of carbs you can eat at each meal. Eat 4-6 oz (112-168 g) of lean protein each day, such as lean meat, chicken, fish, eggs, or tofu. One ounce (oz) of lean protein is equal to: 1 oz (28 g) of meat, chicken, or fish. 1 egg.  cup (62 g) of tofu. Eat some foods each day that contain healthy fats, such as avocado, nuts, seeds, and fish. What foods should I eat? Fruits Berries. Apples. Oranges. Peaches. Apricots. Plums. Grapes. Mango. Papaya.Pomegranate. Kiwi. Cherries. Vegetables Lettuce. Spinach. Leafy greens, including kale, chard, collard greens, and mustard greens. Beets. Cauliflower. Cabbage. Broccoli. Carrots. Green beans.Tomatoes. Peppers. Onions. Cucumbers. Brussels sprouts. Grains Whole grains, such as whole-wheat or whole-grain bread, crackers, tortillas,cereal, and pasta. Unsweetened oatmeal. Quinoa. Brown or wild rice. Meats and other proteins Seafood. Poultry without skin. Lean cuts of poultry and beef. Tofu. Nuts. Seeds. Dairy Low-fat or fat-free dairy products such as milk, yogurt, and cheese. The items listed above may not be a complete list of foods and beverages you can eat. Contact a dietitian for more information. What foods should I avoid? Fruits Fruits canned with syrup. Vegetables Canned vegetables. Frozen vegetables with butter or cream sauce. Grains Refined white  flour and flour products such as bread, pasta, snack foods, andcereals. Avoid all processed foods. Meats and other proteins Fatty cuts of meat. Poultry with skin. Breaded or fried meats. Processed meat.Avoid saturated fats. Dairy Full-fat yogurt, cheese, or milk. Beverages Sweetened drinks, such as soda or iced tea. The items listed above may not be a complete list of foods and beverages you should avoid. Contact a dietitian for more information. Questions to ask a health care provider Do I need to meet with a diabetes educator? Do I need to meet with a dietitian? What number can I call if I have questions? When are the best times to check my blood glucose? Where to find more information: American Diabetes Association: diabetes.org Academy of Nutrition and Dietetics: www.eatright.[employer] of Diabetes and Digestive and Kidney Diseases: [place] Association of Diabetes Care and Education Specialists: www.diabeteseducator.org Summary It is important to have healthy eating habits because your blood sugar (glucose) levels are greatly affected by what you eat and drink. A healthy meal plan will help you control your blood glucose and maintain a healthy lifestyle. Your health care provider may recommend that you work with a dietitian to make a meal plan that is best for you. Keep in  mind that carbohydrates (carbs) and alcohol have immediate effects on your blood glucose levels. It is important to count carbs and to use alcohol carefully. This information is not intended to replace advice given to you by your health care provider. Make sure you discuss any questions you have with your healthcare provider. Document Revised: 08/19/2019 Document Reviewed: 08/19/2019 Elsevier Patient Education  2021 [place] Your Hypertension Hypertension, also called high blood pressure, is when the force of the blood pressing against the walls of the arteries is too strong.  Arteries are blood vessels that carry blood from your heart throughout your body. Hypertension forces the heart to work harder to pump blood and may cause the arteries tobecome narrow or stiff. Understanding blood pressure readings Your personal target blood pressure may vary depending on your medical conditions, your age, and other factors. A blood pressure reading includes a higher number over a lower number. Ideally, your blood pressure should be below 120/80. You should know that: The first, or top, number is called the systolic pressure. It is a measure of the pressure in your arteries as your heart beats. The second, or bottom number, is called the diastolic pressure. It is a measure of the pressure in your arteries as the heart relaxes. Blood pressure is classified into four stages. Based on your blood pressure reading, your health care provider may use the following stages to determine what type of treatment you need, if any. Systolic pressure and diastolicpressure are measured in a unit called mmHg. Normal Systolic pressure: below 672. Diastolic pressure: below 80. Elevated Systolic pressure: 094-709. Diastolic pressure: below 80. Hypertension stage 1 Systolic pressure: 628-366. Diastolic pressure: 29-47. Hypertension stage 2 Systolic pressure: 654 or above. Diastolic pressure: 90 or above. How can this condition affect me? Managing your hypertension is an important responsibility. Over time, hypertension can damage the arteries and decrease blood flow to important parts of the body, including the brain, heart, and kidneys. Having untreated or uncontrolled hypertension can lead to: A heart attack. A stroke. A weakened blood vessel (aneurysm). Heart failure. Kidney damage. Eye damage. Metabolic syndrome. Memory and concentration problems. Vascular dementia. What actions can I take to manage this condition? Hypertension can be managed by making lifestyle changes and possibly by  taking medicines. Your health care provider will help you make a plan to bring yourblood pressure within a normal range. Nutrition  Eat a diet that is high in fiber and potassium, and low in salt (sodium), added sugar, and fat. An example eating plan is called the Dietary Approaches to Stop Hypertension (DASH) diet. To eat this way: Eat plenty of fresh fruits and vegetables. Try to fill one-half of your plate at each meal with fruits and vegetables. Eat whole grains, such as whole-wheat pasta, brown rice, or whole-grain bread. Fill about one-fourth of your plate with whole grains. Eat low-fat dairy products. Avoid fatty cuts of meat, processed or cured meats, and poultry with skin. Fill about one-fourth of your plate with lean proteins such as fish, chicken without skin, beans, eggs, and tofu. Avoid pre-made and processed foods. These tend to be higher in sodium, added sugar, and fat. Reduce your daily sodium intake. Most people with hypertension should eat less than 1,500 mg of sodium a day.  Lifestyle  Work with your health care provider to maintain a healthy body weight or to lose weight. Ask what an ideal weight is for you. Get at least 30 minutes of exercise that causes your heart  to beat faster (aerobic exercise) most days of the week. Activities may include walking, swimming, or biking. Include exercise to strengthen your muscles (resistance exercise), such as weight lifting, as part of your weekly exercise routine. Try to do these types of exercises for 30 minutes at least 3 days a week. Do not use any products that contain nicotine or tobacco, such as cigarettes, e-cigarettes, and chewing tobacco. If you need help quitting, ask your health care provider. Control any long-term (chronic) conditions you have, such as high cholesterol or diabetes. Identify your sources of stress and find ways to manage stress. This may include meditation, deep breathing, or making time for fun  activities.  Alcohol use Do not drink alcohol if: Your health care provider tells you not to drink. You are pregnant, may be pregnant, or are planning to become pregnant. If you drink alcohol: Limit how much you use to: 0-1 drink a day for women. 0-2 drinks a day for men. Be aware of how much alcohol is in your drink. In the [place] one drink equals one 12 oz bottle of beer (355 mL), one 5 oz glass of wine (148 mL), or one 1 oz glass of hard liquor (44 mL). Medicines Your health care provider may prescribe medicine if lifestyle changes are not enough to get your blood pressure under control and if: Your systolic blood pressure is 130 or higher. Your diastolic blood pressure is 80 or higher. Take medicines only as told by your health care provider. Follow the directions carefully. Blood pressure medicines must be taken as told by your health care provider. The medicine does not work as well when you skip doses. Skippingdoses also puts you at risk for problems. Monitoring Before you monitor your blood pressure: Do not smoke, drink caffeinated beverages, or exercise within 30 minutes before taking a measurement. Use the bathroom and empty your bladder (urinate). Sit quietly for at least 5 minutes before taking measurements. Monitor your blood pressure at home as told by your health care provider. To do this: Sit with your back straight and supported. Place your feet flat on the floor. Do not cross your legs. Support your arm on a flat surface, such as a table. Make sure your upper arm is at heart level. Each time you measure, take two or three readings one minute apart and record the results. You may also need to have your blood pressure checked regularly by your healthcare provider. General information Talk with your health care provider about your diet, exercise habits, and other lifestyle factors that may be contributing to hypertension. Review all the medicines you take with your health  care provider because there may be side effects or interactions. Keep all visits as told by your health care provider. Your health care provider can help you create and adjust your plan for managing your high blood pressure. Where to find more information National Heart, Lung, and Blood Institute: https://wilson-eaton.com/ American Heart Association: www.heart.org Contact a health care provider if: You think you are having a reaction to medicines you have taken. You have repeated (recurrent) headaches. You feel dizzy. You have swelling in your ankles. You have trouble with your vision. Get help right away if: You develop a severe headache or confusion. You have unusual weakness or numbness, or you feel faint. You have severe pain in your chest or abdomen. You vomit repeatedly. You have trouble breathing. These symptoms may represent a serious problem that is an emergency. Do not wait to see if  the symptoms will go away. Get medical help right away. Call your local emergency services (911 in the [place] Do not drive yourself to the hospital. Summary Hypertension is when the force of blood pumping through your arteries is too strong. If this condition is not controlled, it may put you at risk for serious complications. Your personal target blood pressure may vary depending on your medical conditions, your age, and other factors. For most people, a normal blood pressure is less than 120/80. Hypertension is managed by lifestyle changes, medicines, or both. Lifestyle changes to help manage hypertension include losing weight, eating a healthy, low-sodium diet, exercising more, stopping smoking, and limiting alcohol. This information is not intended to replace advice given to you by your health care provider. Make sure you discuss any questions you have with your healthcare provider. Document Revised: 10/17/2019 Document Reviewed: 08/12/2019 Elsevier Patient Education  2022 [employer].

## 2021-03-18 ENCOUNTER — Other Ambulatory Visit: Payer: Self-pay | Admitting: Nurse Practitioner

## 2021-03-18 ENCOUNTER — Other Ambulatory Visit: Payer: Self-pay

## 2021-03-18 MED ORDER — DEXCOM G6 TRANSMITTER MISC: 6 refills | Status: DC

## 2021-03-18 MED ORDER — DEXCOM G6 TRANSMITTER MISC: 3 refills | Status: DC

## 2021-03-18 MED FILL — Dulaglutide Soln Auto-injector 0.75 MG/0.5ML: SUBCUTANEOUS | 56 days supply | Qty: 4 | Fill #2 | Status: CN

## 2021-03-18 MED FILL — Dulaglutide Soln Auto-injector 0.75 MG/0.5ML: SUBCUTANEOUS | 84 days supply | Qty: 6 | Fill #2 | Status: AC

## 2021-03-21 ENCOUNTER — Telehealth: Payer: Self-pay

## 2021-03-21 ENCOUNTER — Other Ambulatory Visit: Payer: Self-pay

## 2021-03-21 ENCOUNTER — Telehealth: Payer: Self-pay | Admitting: Nurse Practitioner

## 2021-03-21 ENCOUNTER — Other Ambulatory Visit: Payer: Self-pay | Admitting: Nurse Practitioner

## 2021-03-21 DIAGNOSIS — R634 Abnormal weight loss: Secondary | ICD-10-CM

## 2021-03-21 DIAGNOSIS — Z794 Long term (current) use of insulin: Secondary | ICD-10-CM

## 2021-03-21 DIAGNOSIS — E1165 Type 2 diabetes mellitus with hyperglycemia: Secondary | ICD-10-CM

## 2021-03-21 DIAGNOSIS — R0602 Shortness of breath: Secondary | ICD-10-CM

## 2021-03-21 DIAGNOSIS — E785 Hyperlipidemia, unspecified: Secondary | ICD-10-CM

## 2021-03-21 DIAGNOSIS — J302 Other seasonal allergic rhinitis: Secondary | ICD-10-CM

## 2021-03-21 DIAGNOSIS — R7309 Other abnormal glucose: Secondary | ICD-10-CM

## 2021-03-21 MED ORDER — DULAGLUTIDE 0.75 MG/0.5ML ~~LOC~~ SOAJ: 0.7500 mg | SUBCUTANEOUS | 11 refills | Status: DC

## 2021-03-21 MED ORDER — GABAPENTIN 300 MG PO CAPS
300.0000 mg | ORAL_CAPSULE | Freq: Three times a day (TID) | ORAL | 3 refills | Status: DC
  Filled 2021-03-21 – 2021-12-06 (×3): qty 270, 90d supply, fill #0

## 2021-03-21 MED ORDER — FUROSEMIDE 40 MG PO TABS
40.0000 mg | ORAL_TABLET | Freq: Two times a day (BID) | ORAL | 3 refills | Status: DC
  Filled 2021-03-21 – 2021-12-06 (×3): qty 180, 90d supply, fill #0

## 2021-03-21 MED ORDER — ATORVASTATIN CALCIUM 20 MG PO TABS
20.0000 mg | ORAL_TABLET | Freq: Every day | ORAL | 3 refills | Status: DC
  Filled 2021-03-21 – 2021-12-06 (×3): qty 90, 90d supply, fill #0

## 2021-03-21 MED ORDER — GLIPIZIDE 10 MG PO TABS
10.0000 mg | ORAL_TABLET | Freq: Two times a day (BID) | ORAL | 3 refills | Status: DC
  Filled 2021-03-21: qty 180, 90d supply, fill #0

## 2021-03-21 MED ORDER — CETIRIZINE HCL 10 MG PO TABS
10.0000 mg | ORAL_TABLET | Freq: Every morning | ORAL | 3 refills | Status: DC
  Filled 2021-03-21: qty 90, 90d supply, fill #0

## 2021-03-21 MED ORDER — FLUTICASONE PROPIONATE 50 MCG/ACT NA SUSP
1.0000 | Freq: Every day | NASAL | 11 refills | Status: DC
  Filled 2021-03-21: qty 16, 30d supply, fill #0

## 2021-03-21 MED ORDER — ENTRESTO 97-103 MG PO TABS
1.0000 | ORAL_TABLET | Freq: Two times a day (BID) | ORAL | 3 refills | Status: DC
  Filled 2021-03-21: qty 180, 90d supply, fill #0

## 2021-03-21 MED ORDER — DOCUSATE SODIUM 100 MG PO CAPS
100.0000 mg | ORAL_CAPSULE | Freq: Three times a day (TID) | ORAL | 3 refills | Status: DC | PRN
  Filled 2021-03-21: qty 270, 90d supply, fill #0

## 2021-03-21 MED ORDER — METFORMIN HCL 1000 MG PO TABS
1000.0000 mg | ORAL_TABLET | Freq: Two times a day (BID) | ORAL | 3 refills | Status: DC
  Filled 2021-03-21 – 2021-12-06 (×3): qty 180, 90d supply, fill #0

## 2021-03-21 NOTE — Telephone Encounter (Signed)
Patient call and said [facility] said they need refill on all her meds. She only have 3 days left of her meds.

## 2021-03-21 NOTE — Telephone Encounter (Signed)
Pt needs refill Freestyle libre 2  Stresso (heart pill)

## 2021-03-21 NOTE — Telephone Encounter (Signed)
[facility] contact [facility] for refills sent 6/24.

## 2021-03-22 ENCOUNTER — Other Ambulatory Visit: Payer: Self-pay

## 2021-03-23 ENCOUNTER — Ambulatory Visit: Payer: Medicare Other

## 2021-03-23 ENCOUNTER — Other Ambulatory Visit: Payer: Self-pay

## 2021-03-24 ENCOUNTER — Other Ambulatory Visit: Payer: Self-pay

## 2021-03-24 ENCOUNTER — Encounter: Payer: Self-pay | Admitting: Physical Medicine and Rehabilitation

## 2021-03-29 ENCOUNTER — Encounter (INDEPENDENT_AMBULATORY_CARE_PROVIDER_SITE_OTHER): Payer: Medicare Other

## 2021-03-30 NOTE — Progress Notes (Deleted)
03/31/21- 65 yoF Smoker for sleep evaluation courtesy of Dr Vaughan Browner with conceern of OSA Medical problem list includes HTN, OSA, COPD (Dr Vaughan Browner), Chronic Respiratory Failure, GERD, DM2, Hypercholesterolemia, Morbid Obesity,  NPSG 06/16/16- AHI 18// hr, desaturation to 65%, body weight 260 lbs CPAP auto 5-15/ [employer] Epworth score- Body weight today- Covid vax-

## 2021-03-31 ENCOUNTER — Institutional Professional Consult (permissible substitution): Payer: Medicare Other | Admitting: Internal Medicine

## 2021-04-05 ENCOUNTER — Telehealth: Payer: Self-pay

## 2021-04-05 NOTE — Telephone Encounter (Signed)
   [facility] Pre-operative Risk Assessment    Patient Name: Ameira Alessandrini  DOB: November 19, 1960 MRN: [id number]  HEARTCARE STAFF:  - IMPORTANT!!!!!! Under Visit Info/Reason for Call, type in Other and utilize the format Clearance MM/DD/YY or Clearance TBD. Do not use dashes or single digits. - Please review there is not already an duplicate clearance open for this procedure. - If request is for dental extraction, please clarify the # of teeth to be extracted. - If the patient is currently at the dentist's office, call Pre-Op Callback Staff (MA/nurse) to input urgent request.  - If the patient is not currently in the dentist office, please route to the Pre-Op pool.  Request for surgical clearance:  What type of surgery is being performed? UMBILICAL HERNIA REPAIR  When is this surgery scheduled? TBD  What type of clearance is required (medical clearance vs. Pharmacy clearance to hold med vs. Both)? MEDICAL  Are there any medications that need to be held prior to surgery and how long? NONE  Practice name and name of physician performing surgery? [facility], DR. Michaelle Birks  What is the office phone number? [phone]   7.   What is the office fax number? [phone]  8.   Anesthesia type (None, local, MAC, general) ? GENERAL   Jacinta Shoe 04/05/2021, 4:58 PM  _________________________________________________________________   (provider comments below)

## 2021-04-06 NOTE — Telephone Encounter (Signed)
   Name: Sharon Swanson  DOB: 09-Feb-1961  MRN: [id number]   Primary Cardiologist: Larae Grooms, MD  Chart reviewed as part of pre-operative protocol coverage. Patient was contacted 04/06/2021 in reference to pre-operative risk assessment for pending surgery as outlined below.  Sharon Swanson was last seen on 02/02/2021 by Nena Polio PA-C.    As previously outlined by Nena Polio PA-C "Patient has hypertensive heart disease with chronic diastolic CHF and edema.  She is followed at the lymphedema clinic in [place] and has to use a compression boot twice daily.  She has no history of CAD and has never had a work-up for this.  She walks with a walker and is on home O2 and has been cleared by pulmonary for the surgery.  Despite her limitations her METs are still over 5.  If her echo is okay think she will need further testing before undergoing ventral hernia repair.  According to the Revised Cardiac Risk Index (RCRI), her Perioperative Risk of Major Cardiac Event is (%): 11. Her Functional Capacity in METs is: 5.07 according to the Duke Activity Status Index (DASI)."    Patient's echo showed normal LV function.  Spoke to patient today on the phone.  She denies angina and heart failure symptoms.  Therefore, based on ACC/AHA guidelines, the patient would be at acceptable risk for the planned procedure without further cardiovascular testing.   The patient was advised that if she develops new symptoms prior to surgery to contact our office to arrange for a follow-up visit, and she verbalized understanding.  I will route this recommendation to the requesting party via Epic fax function and remove from pre-op pool. Please call with questions.  Warren Lacy, PA-C 04/06/2021, 9:02 AM

## 2021-04-07 ENCOUNTER — Telehealth: Payer: Self-pay | Admitting: Pulmonary Disease

## 2021-04-07 DIAGNOSIS — J449 Chronic obstructive pulmonary disease, unspecified: Secondary | ICD-10-CM | POA: Diagnosis not present

## 2021-04-07 NOTE — Telephone Encounter (Signed)
Patient is following up regarding her clearance. She states [facility] did not receive the recommendation and she is hoping it can be faxed again ASAP.  She provided 2 fax numbers for their office, [phone] and [phone].

## 2021-04-07 NOTE — Telephone Encounter (Signed)
Called and spoke with patient regarding surgery clearance. Patient was here in April. I advised her that Dr. Vaughan Browner sent her surgeon her chart saying she was cleared for surgery. I printed out the message he sent and faxed it to the 2 numbers she provided.   Nothing further needed at this time.

## 2021-04-08 NOTE — Telephone Encounter (Signed)
Fax resubmitted. Will remove from the preop pool at this time

## 2021-04-10 DIAGNOSIS — J449 Chronic obstructive pulmonary disease, unspecified: Secondary | ICD-10-CM | POA: Diagnosis not present

## 2021-04-10 DIAGNOSIS — G4733 Obstructive sleep apnea (adult) (pediatric): Secondary | ICD-10-CM | POA: Diagnosis not present

## 2021-04-11 ENCOUNTER — Telehealth: Payer: Self-pay | Admitting: Pulmonary Disease

## 2021-04-11 NOTE — Telephone Encounter (Signed)
Called and spoke with Patient.  Patient requested surgical clearance note from Dr. Vaughan Browner be faxed to [facility], attention Abigail Butts. LOV note faxed to requested [phone], attention Abigail Butts. Confirmation received. Nothing further at this time.

## 2021-04-13 ENCOUNTER — Ambulatory Visit: Payer: Medicare Other

## 2021-04-22 ENCOUNTER — Ambulatory Visit: Payer: Medicare Other

## 2021-04-22 DIAGNOSIS — K429 Umbilical hernia without obstruction or gangrene: Secondary | ICD-10-CM | POA: Diagnosis not present

## 2021-05-06 ENCOUNTER — Telehealth: Payer: Self-pay

## 2021-05-06 DIAGNOSIS — E119 Type 2 diabetes mellitus without complications: Secondary | ICD-10-CM | POA: Diagnosis not present

## 2021-05-06 DIAGNOSIS — Z794 Long term (current) use of insulin: Secondary | ICD-10-CM | POA: Diagnosis not present

## 2021-05-06 NOTE — Telephone Encounter (Signed)
Patient requesting refill on Tramadol

## 2021-05-08 DIAGNOSIS — J449 Chronic obstructive pulmonary disease, unspecified: Secondary | ICD-10-CM | POA: Diagnosis not present

## 2021-05-10 ENCOUNTER — Other Ambulatory Visit: Payer: Self-pay | Admitting: Nurse Practitioner

## 2021-05-10 MED ORDER — TRAMADOL HCL 50 MG PO TABS: 50.0000 mg | ORAL_TABLET | Freq: Four times a day (QID) | ORAL | 0 refills | Status: AC | PRN

## 2021-05-11 ENCOUNTER — Telehealth: Payer: Self-pay

## 2021-05-11 DIAGNOSIS — G4733 Obstructive sleep apnea (adult) (pediatric): Secondary | ICD-10-CM | POA: Diagnosis not present

## 2021-05-11 DIAGNOSIS — J449 Chronic obstructive pulmonary disease, unspecified: Secondary | ICD-10-CM | POA: Diagnosis not present

## 2021-05-11 NOTE — Telephone Encounter (Signed)
Tramadol

## 2021-05-12 NOTE — Telephone Encounter (Signed)
Rx sent 

## 2021-05-13 ENCOUNTER — Other Ambulatory Visit: Payer: Self-pay | Admitting: Nurse Practitioner

## 2021-05-13 ENCOUNTER — Other Ambulatory Visit: Payer: Self-pay

## 2021-05-13 DIAGNOSIS — G4733 Obstructive sleep apnea (adult) (pediatric): Secondary | ICD-10-CM

## 2021-05-13 DIAGNOSIS — J449 Chronic obstructive pulmonary disease, unspecified: Secondary | ICD-10-CM

## 2021-05-13 DIAGNOSIS — Z720 Tobacco use: Secondary | ICD-10-CM

## 2021-05-13 NOTE — Progress Notes (Unsigned)
   [facility] [address] [place], [place]  [address] Phone:  [phone]   Fax:  [phone] 

## 2021-05-18 DIAGNOSIS — J449 Chronic obstructive pulmonary disease, unspecified: Secondary | ICD-10-CM | POA: Diagnosis not present

## 2021-05-27 ENCOUNTER — Other Ambulatory Visit: Payer: Self-pay | Admitting: *Deleted

## 2021-05-27 DIAGNOSIS — F17210 Nicotine dependence, cigarettes, uncomplicated: Secondary | ICD-10-CM

## 2021-06-02 ENCOUNTER — Other Ambulatory Visit: Payer: Self-pay

## 2021-06-06 DIAGNOSIS — E119 Type 2 diabetes mellitus without complications: Secondary | ICD-10-CM | POA: Diagnosis not present

## 2021-06-06 DIAGNOSIS — Z794 Long term (current) use of insulin: Secondary | ICD-10-CM | POA: Diagnosis not present

## 2021-06-08 ENCOUNTER — Other Ambulatory Visit: Payer: Self-pay

## 2021-06-08 ENCOUNTER — Ambulatory Visit (INDEPENDENT_AMBULATORY_CARE_PROVIDER_SITE_OTHER): Payer: Medicare Other

## 2021-06-08 DIAGNOSIS — Z0000 Encounter for general adult medical examination without abnormal findings: Secondary | ICD-10-CM | POA: Diagnosis not present

## 2021-06-08 DIAGNOSIS — J449 Chronic obstructive pulmonary disease, unspecified: Secondary | ICD-10-CM | POA: Diagnosis not present

## 2021-06-08 NOTE — Progress Notes (Signed)
Subjective:   Sharon Swanson is a 60 y.o. female who presents for Medicare Annual (Subsequent) preventive examination. I connected with  Sharon Swanson on 06/08/21 by audio enabled telemedicine application and verified that I am speaking with the correct person using two identifiers.   I discussed the limitations of evaluation and management by telemedicine. The patient expressed understanding and agreed to proceed.   Location of Patient: Home Location of Provider: Office  List any persons and their role that are participating in the visit with the patient: Lavell Luster, LPN Review of Systems    Defer to PCP       Objective:    There were no vitals filed for this visit. There is no height or weight on file to calculate BMI.  Advanced Directives 08/29/2020 05/04/2020 08/02/2019 08/02/2019 10/01/2018 07/02/2018 03/07/2018  Does Patient Have a Medical Advance Directive? No No No No No No No  Type of Advance Directive - - - - - - -  Does patient want to make changes to medical advance directive? - - - - - - -  Copy of [place] in Chart? - - - - - - -  Would patient like information on creating a medical advance directive? No - Patient declined - No - Patient declined - - No - Patient declined No - Patient declined  Pre-existing out of facility DNR order (yellow form or pink MOST form) - - - - - - -    Current Medications (verified) Outpatient Encounter Medications as of 06/08/2021  Medication Sig   albuterol (VENTOLIN HFA) 108 (90 Base) MCG/ACT inhaler USE 2 INHALATIONS BY MOUTH  INTO THE LUNGS EVERY 4  HOURS AS NEEDED FOR  WHEEZING OR SHORTNESS OF  BREATH   Alcohol Swabs (B-D SINGLE USE SWABS REGULAR) PADS SMARTSIG:1 [place](s) Topical 3 Times Daily   atorvastatin (LIPITOR) 20 MG tablet Take 1 tablet (20 mg total) by mouth daily.   carvedilol (COREG) 12.5 MG tablet Take 1 tablet (12.5 mg total) by mouth 2 (two) times daily.   cetirizine (ZYRTEC) 10 MG tablet Take 1  tablet (10 mg total) by mouth in the morning.   Continuous Blood Gluc Receiver (FREESTYLE LIBRE 2 READER) DEVI Use as directed daily.   Continuous Blood Gluc Sensor (FREESTYLE LIBRE 2 SENSOR) MISC 1 application by Does not apply route every 14 (fourteen) days.   Continuous Blood Gluc Transmit (DEXCOM G6 TRANSMITTER) MISC Change every 10 days as needed/directed   Continuous Blood Gluc Transmit (DEXCOM G6 TRANSMITTER) MISC Use as directed   docusate sodium (COLACE) 100 MG capsule Take 1 capsule (100 mg total) by mouth 3 (three) times daily as needed for mild constipation.   Dulaglutide 0.75 MG/0.5ML SOPN INJECT 0.75 MG INTO THE SKIN ONCE A WEEK.   ENTRESTO 97-103 MG Take 1 tablet by mouth 2 (two) times daily.   fluticasone (FLONASE) 50 MCG/ACT nasal spray Place 1 spray into both nostrils daily.   furosemide (LASIX) 40 MG tablet Take 1 tablet (40 mg total) by mouth 2 (two) times daily.   gabapentin (NEURONTIN) 300 MG capsule Take 1 capsule (300 mg total) by mouth 3 (three) times daily.   glipiZIDE (GLUCOTROL) 10 MG tablet Take 1 tablet (10 mg total) by mouth 2 (two) times daily before a meal.   glucose monitoring kit (FREESTYLE) monitoring kit 1 each by Does not apply route as needed for other.   insulin aspart (NOVOLOG) 100 UNIT/ML injection Inject 20 Units into the skin 3 (  three) times daily with meals.   insulin glargine (LANTUS) 100 UNIT/ML injection Inject 0.6 mLs (60 Units total) into the skin daily.   ipratropium-albuterol (DUONEB) 0.5-2.5 (3) MG/3ML SOLN Take 3 mLs by nebulization every 6 (six) hours as needed (shortness of breath, wheeze).   metFORMIN (GLUCOPHAGE) 1000 MG tablet Take 1 tablet (1,000 mg total) by mouth 2 (two) times daily with a meal.   methocarbamol (ROBAXIN) 750 MG tablet Take 1 tablet (750 mg total) by mouth 3 (three) times daily as needed for muscle spasms.   Misc. Devices (BARIATRIC ROLLATOR) MISC 1 Device by Does not apply route daily.   montelukast (SINGULAIR) 10 MG  tablet Take 1 tablet (10 mg total) by mouth at bedtime.   nicotine (NICODERM CQ - DOSED IN MG/24 HOURS) 21 mg/24hr patch Place 1 patch (21 mg total) onto the skin daily.   pantoprazole (PROTONIX) 40 MG tablet Take 1 tablet (40 mg total) by mouth daily.   spironolactone (ALDACTONE) 25 MG tablet TAKE 1 TABLET (25 MG TOTAL) BY MOUTH DAILY.   Tiotropium Bromide-Olodaterol (STIOLTO RESPIMAT) 2.5-2.5 MCG/ACT AERS Inhale 2 puffs into the lungs daily.   traMADol (ULTRAM) 50 MG tablet Take 1 tablet (50 mg total) by mouth every 6 (six) hours as needed for severe pain.   varenicline (CHANTIX) 0.5 MG tablet Take 1 tablet (0.5 mg total) by mouth 2 (two) times daily.   Vitamin D, Ergocalciferol, (DRISDOL) 1.25 MG (50000 UNIT) CAPS capsule TAKE 1 CAPSULE (50,000 UNITS TOTAL) BY MOUTH EVERY 7 (SEVEN) DAYS.   No facility-administered encounter medications on file as of 06/08/2021.    Allergies (verified) Aspirin   History: Past Medical History:  Diagnosis Date   Abnormal Pap smear of cervix 10/2020   Acid reflux    Asthma    Atypical squamous cells of undetermined significance (ASC-US) on cervical Pap smear 10/2020   Bilateral lower extremity edema 01/2020   COPD (chronic obstructive pulmonary disease) (HCC)    CPAP (continuous positive airway pressure) dependence    Diabetes ([place])    Diabetes mellitus without complication ([place])    Type II   Fatty liver 11/08/2011   Fibroids 11/08/2011   Hyperglycemia    Hyperlipidemia 05/2020   Hypertension    Microalbuminuria 01/2020   Obesity (BMI 30-39.9) 11/08/2011   Osteoarthritis of right acromioclavicular joint    Pancreatitis 11/08/2011   Rotator cuff tear, right    Shortness of breath on exertion 01/2020   Sleep apnea    Ventral hernia    Vitamin D deficiency 05/2020   Past Surgical History:  Procedure Laterality Date   ORTHOPEDIC SURGERY     R ankle, tendonitis   Family History  Problem Relation Age of Onset   Hypertension Mother    Diabetes  Other    Diabetes Maternal Grandmother    Hypertension Maternal Grandmother    Social History   Socioeconomic History   Marital status: Married    Spouse name: Not on file   Number of children: Not on file   Years of education: Not on file   Highest education level: Not on file  Occupational History   Not on file  Tobacco Use   Smoking status: Every Day    Packs/day: 0.50    Years: 39.00    Pack years: 19.50    Types: Cigarettes    Start date: 09/25/1980   Smokeless tobacco: Never   Tobacco comments:    pt states she smoke 2 cigerettes a day  Vaping  Use   Vaping Use: Never used  Substance and Sexual Activity   Alcohol use: No   Drug use: No   Sexual activity: Not Currently    Partners: Male  Other Topics Concern   Not on file  Social History Narrative   Not on file   Social Determinants of Health   Financial Resource Strain: Not on file  Food Insecurity: Not on file  Transportation Needs: Not on file  Physical Activity: Not on file  Stress: Not on file  Social Connections: Not on file    Tobacco Counseling Ready to quit: Not Answered Counseling given: Not Answered Tobacco comments: pt states she smoke 2 cigerettes a day   Clinical Intake:                 Diabetic?yes Nutrition Risk Assessment:  Has the patient had any N/V/D within the last 2 months?  No  Does the patient have any non-healing wounds?  No  Has the patient had any unintentional weight loss or weight gain?  Yes   Diabetes:  Is the patient diabetic?  Yes  If diabetic, was a CBG obtained today?   Pt using Freestyle Libre. Last checked BG 165 Did the patient bring in their glucometer from home?   N/A How often do you monitor your CBG's? 4 or more times per day.   Financial Strains and Diabetes Management:  Are you having any financial strains with the device, your supplies or your medication?  Pt voiced concerns of price of medications on fixed income.  .  Does the patient want  to be seen by Chronic Care Management for management of their diabetes?   States she will discuss with her PCP.  Would the patient like to be referred to a Nutritionist or for Diabetic Management?  No   Diabetic Exams:  Diabetic Eye Exam: Overdue for diabetic eye exam. Pt has been advised about the importance in completing this exam. Patient advised to call and schedule an eye exam. Diabetic Foot Exam: Completed Pt reports having a foot exam with Kathe Becton, [place] in 2022.          Activities of Daily Living No flowsheet data found.  Patient Care Team: Vevelyn Francois, NP as PCP - General (Adult Health Nurse Practitioner) Jettie Booze, MD as PCP - Cardiology (Cardiology)  Indicate any recent Medical Services you may have received from other than [facility] providers in the past year (date may be approximate).     Assessment:   This is a routine wellness examination for Chasya.  Hearing/Vision screen No results found.  Dietary issues and exercise activities discussed:     Goals Addressed   None   Depression Screen PHQ 2/9 Scores 06/28/2020 05/28/2020 02/25/2020  PHQ - 2 Score 0 0 0  Exception Documentation Medical reason - -    Fall Risk Fall Risk  03/17/2021 06/28/2020 05/28/2020 02/25/2020 02/18/2020  Falls in the past year? 0 1 1 0 0  Comment - - - - -  Number falls in past yr: 0 0 0 0 0  Injury with Fall? 0 0 0 0 0  Risk for fall due to : - Impaired mobility - - -    FALL RISK PREVENTION PERTAINING TO THE HOME:  Any stairs in or around the home? No  If so, are there any without handrails? No  Home free of loose throw rugs in walkways, pet beds, electrical cords, etc? Yes  Adequate lighting in your  home to reduce risk of falls? Yes   ASSISTIVE DEVICES UTILIZED TO PREVENT FALLS:  Life alert? No  Use of a cane, walker or w/c? Yes  Grab bars in the bathroom? No  Shower chair or bench in shower? No  Elevated toilet seat or a handicapped toilet? No   TIMED UP AND  GO:  Was the test performed?  N/A .  Length of time to ambulate 10 feet: N/A sec.     Cognitive Function:        Immunizations Immunization History  Administered Date(s) Administered   DTaP 05/28/2020   Influenza,inj,Quad PF,6+ Mos 08/18/2014, 06/20/2016, 05/28/2020   Influenza-Unspecified 08/18/2014, 06/19/2016   Moderna Sars-Covid-2 Vaccination 12/12/2019, 01/13/2020   Pneumococcal Polysaccharide-23 08/18/2014, 04/25/2016, 06/28/2020   Pneumococcal-Unspecified 05/29/2016    TDAP status: Up to date  Flu Vaccine status: Due, Education has been provided regarding the importance of this vaccine. Advised may receive this vaccine at local pharmacy or Health Dept. Aware to provide a copy of the vaccination record if obtained from local pharmacy or Health Dept. Verbalized acceptance and understanding.  Pneumococcal vaccine status: Up to date  Covid-19 vaccine status: Completed vaccines  Qualifies for Shingles Vaccine? Yes   Zostavax completed No   Shingrix Completed?: No.    Education has been provided regarding the importance of this vaccine. Patient has been advised to call insurance company to determine out of pocket expense if they have not yet received this vaccine. Advised may also receive vaccine at local pharmacy or Health Dept. Verbalized acceptance and understanding.  Screening Tests Health Maintenance  Topic Date Due   MAMMOGRAM  Never done   COVID-19 Vaccine (3 - Booster for Moderna series) 06/14/2020   INFLUENZA VACCINE  04/25/2021   Zoster Vaccines- Shingrix (1 of 2) 06/17/2021 (Originally 02/02/2011)   TETANUS/TDAP  11/21/2021 (Originally 02/02/1980)   FOOT EXAM  11/28/2021 (Originally 02/02/1971)   OPHTHALMOLOGY EXAM  11/29/2021 (Originally 02/02/1971)   Hepatitis C Screening  12/14/2021 (Originally 02/02/1979)   Pneumococcal Vaccine 32-68 Years old (2 - PCV) 03/17/2022 (Originally 06/28/2021)   HEMOGLOBIN A1C  09/16/2021   COLONOSCOPY (Pts 45-41yr Insurance  coverage will need to be confirmed)  07/18/2022   PAP SMEAR-Modifier  10/23/2023   PNEUMOCOCCAL POLYSACCHARIDE VACCINE AGE 61-64 HIGH RISK  Completed   HIV Screening  Completed   HPV VACCINES  Aged Out    Health Maintenance  Health Maintenance Due  Topic Date Due   MAMMOGRAM  Never done   COVID-19 Vaccine (3 - Booster for Moderna series) 06/14/2020   INFLUENZA VACCINE  04/25/2021    Colorectal cancer screening: Type of screening: Colonoscopy. Completed 07/18/2012. Repeat every 10 years  Mammogram status: Ordered 10/13/2020. Pt provided with contact info and advised to call to schedule appt.   Bone Density status: Ordered (Pt states she will discuss with PCP at visit). Pt provided with contact info and advised to call to schedule appt.  Lung Cancer Screening: (Low Dose CT Chest recommended if Age 60-80years, 30 pack-year currently smoking OR have quit w/in 15years.) does qualify.   Lung Cancer Screening Referral: Last Completed on 05-26-20  Additional Screening:  Hepatitis C Screening: does qualify; Completed Not Completed.   Vision Screening: Recommended annual ophthalmology exams for early detection of glaucoma and other disorders of the eye. Is the patient up to date with their annual eye exam?  No  Who is the provider or what is the name of the office in which the patient attends annual eye exams?  [facility] If pt is not established with a provider, would they like to be referred to a provider to establish care?  Established .   Dental Screening: Recommended annual dental exams for proper oral hygiene  Community Resource Referral / Chronic Care Management: CRR required this visit?  No   CCM required this visit?  No      Plan:     I have personally reviewed and noted the following in the patient's chart:   Medical and social history Use of alcohol, tobacco or illicit drugs  Current medications and supplements including opioid prescriptions.  Functional ability  and status Nutritional status Physical activity Advanced directives List of other physicians Hospitalizations, surgeries, and ER visits in previous 12 months Vitals Screenings to include cognitive, depression, and falls Referrals and appointments  In addition, I have reviewed and discussed with patient certain preventive protocols, quality metrics, and best practice recommendations. A written personalized care plan for preventive services as well as general preventive health recommendations were provided to patient.     Lavell Luster, LPN   [id number]   Nurse Notes: Non- Face to face 45 min visit   Ms. Rodena Piety , Thank you for taking time to come for your Medicare Wellness Visit. I appreciate your ongoing commitment to your health goals. Please review the following plan we discussed and let me know if I can assist you in the future.   These are the goals we discussed:  Goals   None     This is a list of the screening recommended for you and due dates:  Health Maintenance  Topic Date Due   Mammogram  Never done   COVID-19 Vaccine (3 - Booster for Moderna series) 06/14/2020   Flu Shot  04/25/2021   Zoster (Shingles) Vaccine (1 of 2) 06/17/2021*   Tetanus Vaccine  11/21/2021*   Complete foot exam   11/28/2021*   Eye exam for diabetics  11/29/2021*   Hepatitis C Screening: USPSTF Recommendation to screen - Ages 18-79 yo.  12/14/2021*   Pneumococcal Vaccination (2 - PCV) 03/17/2022*   Hemoglobin A1C  09/16/2021   Colon Cancer Screening  07/18/2022   Pap Smear  10/23/2023   Pneumococcal vaccine  Completed   HIV Screening  Completed   HPV Vaccine  Aged Out  *Topic was postponed. The date shown is not the original due date.

## 2021-06-08 NOTE — Patient Instructions (Signed)
Health Maintenance, Female Adopting a healthy lifestyle and getting preventive care are important in promoting health and wellness. Ask your health care provider about: The right schedule for you to have regular tests and exams. Things you can do on your own to prevent diseases and keep yourself healthy. What should I know about diet, weight, and exercise? Eat a healthy diet  Eat a diet that includes plenty of vegetables, fruits, low-fat dairy products, and lean protein. Do not eat a lot of foods that are high in solid fats, added sugars, or sodium. Maintain a healthy weight Body mass index (BMI) is used to identify weight problems. It estimates body fat based on height and weight. Your health care provider can help determine your BMI and help you achieve or maintain a healthy weight. Get regular exercise Get regular exercise. This is one of the most important things you can do for your health. Most adults should: Exercise for at least 150 minutes each week. The exercise should increase your heart rate and make you sweat (moderate-intensity exercise). Do strengthening exercises at least twice a week. This is in addition to the moderate-intensity exercise. Spend less time sitting. Even light physical activity can be beneficial. Watch cholesterol and blood lipids Have your blood tested for lipids and cholesterol at 60 years of age, then have this test every 5 years. Have your cholesterol levels checked more often if: Your lipid or cholesterol levels are high. You are older than 60 years of age. You are at high risk for heart disease. What should I know about cancer screening? Depending on your health history and family history, you may need to have cancer screening at various ages. This may include screening for: Breast cancer. Cervical cancer. Colorectal cancer. Skin cancer. Lung cancer. What should I know about heart disease, diabetes, and high blood pressure? Blood pressure and heart  disease High blood pressure causes heart disease and increases the risk of stroke. This is more likely to develop in people who have high blood pressure readings, are of African descent, or are overweight. Have your blood pressure checked: Every 3-5 years if you are 18-39 years of age. Every year if you are 40 years old or older. Diabetes Have regular diabetes screenings. This checks your fasting blood sugar level. Have the screening done: Once every three years after age 40 if you are at a normal weight and have a low risk for diabetes. More often and at a younger age if you are overweight or have a high risk for diabetes. What should I know about preventing infection? Hepatitis B If you have a higher risk for hepatitis B, you should be screened for this virus. Talk with your health care provider to find out if you are at risk for hepatitis B infection. Hepatitis C Testing is recommended for: Everyone born from 1945 through 1965. Anyone with known risk factors for hepatitis C. Sexually transmitted infections (STIs) Get screened for STIs, including gonorrhea and chlamydia, if: You are sexually active and are younger than 60 years of age. You are older than 60 years of age and your health care provider tells you that you are at risk for this type of infection. Your sexual activity has changed since you were last screened, and you are at increased risk for chlamydia or gonorrhea. Ask your health care provider if you are at risk. Ask your health care provider about whether you are at high risk for HIV. Your health care provider may recommend a prescription medicine   to help prevent HIV infection. If you choose to take medicine to prevent HIV, you should first get tested for HIV. You should then be tested every 3 months for as long as you are taking the medicine. Pregnancy If you are about to stop having your period (premenopausal) and you may become pregnant, seek counseling before you get  pregnant. Take 400 to 800 micrograms (mcg) of folic acid every day if you become pregnant. Ask for birth control (contraception) if you want to prevent pregnancy. Osteoporosis and menopause Osteoporosis is a disease in which the bones lose minerals and strength with aging. This can result in bone fractures. If you are 65 years old or older, or if you are at risk for osteoporosis and fractures, ask your health care provider if you should: Be screened for bone loss. Take a calcium or vitamin D supplement to lower your risk of fractures. Be given hormone replacement therapy (HRT) to treat symptoms of menopause. Follow these instructions at home: Lifestyle Do not use any products that contain nicotine or tobacco, such as cigarettes, e-cigarettes, and chewing tobacco. If you need help quitting, ask your health care provider. Do not use street drugs. Do not share needles. Ask your health care provider for help if you need support or information about quitting drugs. Alcohol use Do not drink alcohol if: Your health care provider tells you not to drink. You are pregnant, may be pregnant, or are planning to become pregnant. If you drink alcohol: Limit how much you use to 0-1 drink a day. Limit intake if you are breastfeeding. Be aware of how much alcohol is in your drink. In the [place] one drink equals one 12 oz bottle of beer (355 mL), one 5 oz glass of wine (148 mL), or one 1 oz glass of hard liquor (44 mL). General instructions Schedule regular health, dental, and eye exams. Stay current with your vaccines. Tell your health care provider if: You often feel depressed. You have ever been abused or do not feel safe at home. Summary Adopting a healthy lifestyle and getting preventive care are important in promoting health and wellness. Follow your health care provider's instructions about healthy diet, exercising, and getting tested or screened for diseases. Follow your health care provider's  instructions on monitoring your cholesterol and blood pressure. This information is not intended to replace advice given to you by your health care provider. Make sure you discuss any questions you have with your health care provider. Document Revised: 11/19/2020 Document Reviewed: 09/04/2018 Elsevier Patient Education  2022 [employer]  

## 2021-06-10 ENCOUNTER — Inpatient Hospital Stay: Admission: RE | Admit: 2021-06-10 | Payer: Medicare Other | Source: Ambulatory Visit

## 2021-06-11 DIAGNOSIS — G4733 Obstructive sleep apnea (adult) (pediatric): Secondary | ICD-10-CM | POA: Diagnosis not present

## 2021-06-11 DIAGNOSIS — J449 Chronic obstructive pulmonary disease, unspecified: Secondary | ICD-10-CM | POA: Diagnosis not present

## 2021-06-13 ENCOUNTER — Other Ambulatory Visit: Payer: Self-pay

## 2021-06-13 ENCOUNTER — Other Ambulatory Visit: Payer: Self-pay | Admitting: Nurse Practitioner

## 2021-06-13 DIAGNOSIS — G8929 Other chronic pain: Secondary | ICD-10-CM

## 2021-06-13 MED ORDER — TRAMADOL HCL 50 MG PO TABS
50.0000 mg | ORAL_TABLET | Freq: Four times a day (QID) | ORAL | 0 refills | Status: AC
  Filled 2021-06-13: qty 30, 7d supply, fill #0

## 2021-06-15 ENCOUNTER — Other Ambulatory Visit: Payer: Self-pay

## 2021-06-15 MED ORDER — ENTRESTO 97-103 MG PO TABS: 1.0000 | ORAL_TABLET | Freq: Two times a day (BID) | ORAL | 3 refills | Status: DC

## 2021-06-17 ENCOUNTER — Ambulatory Visit: Payer: Self-pay | Admitting: Nurse Practitioner

## 2021-06-20 ENCOUNTER — Other Ambulatory Visit: Payer: Self-pay

## 2021-06-20 DIAGNOSIS — J449 Chronic obstructive pulmonary disease, unspecified: Secondary | ICD-10-CM | POA: Diagnosis not present

## 2021-06-23 ENCOUNTER — Other Ambulatory Visit: Payer: Self-pay

## 2021-06-23 DIAGNOSIS — Z794 Long term (current) use of insulin: Secondary | ICD-10-CM

## 2021-06-23 DIAGNOSIS — E1165 Type 2 diabetes mellitus with hyperglycemia: Secondary | ICD-10-CM

## 2021-06-23 DIAGNOSIS — R7309 Other abnormal glucose: Secondary | ICD-10-CM

## 2021-06-23 DIAGNOSIS — R739 Hyperglycemia, unspecified: Secondary | ICD-10-CM

## 2021-06-24 ENCOUNTER — Encounter: Payer: Medicare Other | Admitting: Physical Medicine and Rehabilitation

## 2021-06-27 ENCOUNTER — Other Ambulatory Visit: Payer: Self-pay

## 2021-06-27 DIAGNOSIS — Z794 Long term (current) use of insulin: Secondary | ICD-10-CM

## 2021-06-27 DIAGNOSIS — R7309 Other abnormal glucose: Secondary | ICD-10-CM

## 2021-06-27 DIAGNOSIS — R739 Hyperglycemia, unspecified: Secondary | ICD-10-CM

## 2021-06-27 MED ORDER — INSULIN GLARGINE 100 UNIT/ML ~~LOC~~ SOLN: 60.0000 [IU] | Freq: Every day | SUBCUTANEOUS | 3 refills | Status: DC

## 2021-07-04 ENCOUNTER — Other Ambulatory Visit: Payer: Self-pay

## 2021-07-06 DIAGNOSIS — E119 Type 2 diabetes mellitus without complications: Secondary | ICD-10-CM | POA: Diagnosis not present

## 2021-07-06 DIAGNOSIS — Z794 Long term (current) use of insulin: Secondary | ICD-10-CM | POA: Diagnosis not present

## 2021-07-08 DIAGNOSIS — J449 Chronic obstructive pulmonary disease, unspecified: Secondary | ICD-10-CM | POA: Diagnosis not present

## 2021-07-11 DIAGNOSIS — J449 Chronic obstructive pulmonary disease, unspecified: Secondary | ICD-10-CM | POA: Diagnosis not present

## 2021-07-11 DIAGNOSIS — G4733 Obstructive sleep apnea (adult) (pediatric): Secondary | ICD-10-CM | POA: Diagnosis not present

## 2021-08-08 DIAGNOSIS — J449 Chronic obstructive pulmonary disease, unspecified: Secondary | ICD-10-CM | POA: Diagnosis not present

## 2021-08-09 DIAGNOSIS — Z794 Long term (current) use of insulin: Secondary | ICD-10-CM | POA: Diagnosis not present

## 2021-08-09 DIAGNOSIS — E119 Type 2 diabetes mellitus without complications: Secondary | ICD-10-CM | POA: Diagnosis not present

## 2021-08-11 DIAGNOSIS — G4733 Obstructive sleep apnea (adult) (pediatric): Secondary | ICD-10-CM | POA: Diagnosis not present

## 2021-08-11 DIAGNOSIS — J449 Chronic obstructive pulmonary disease, unspecified: Secondary | ICD-10-CM | POA: Diagnosis not present

## 2021-09-07 ENCOUNTER — Other Ambulatory Visit: Payer: Self-pay

## 2021-09-07 ENCOUNTER — Encounter
Payer: Medicare Other | Attending: Physical Medicine and Rehabilitation | Admitting: Physical Medicine and Rehabilitation

## 2021-09-07 ENCOUNTER — Encounter: Payer: Self-pay | Admitting: Physical Medicine and Rehabilitation

## 2021-09-07 ENCOUNTER — Encounter: Payer: Medicare Other | Admitting: Physical Medicine and Rehabilitation

## 2021-09-07 VITALS — Ht 67.0 in | Wt 311.0 lb

## 2021-09-07 DIAGNOSIS — M5441 Lumbago with sciatica, right side: Secondary | ICD-10-CM

## 2021-09-07 DIAGNOSIS — M5442 Lumbago with sciatica, left side: Secondary | ICD-10-CM

## 2021-09-07 DIAGNOSIS — G8929 Other chronic pain: Secondary | ICD-10-CM | POA: Diagnosis not present

## 2021-09-07 DIAGNOSIS — M48062 Spinal stenosis, lumbar region with neurogenic claudication: Secondary | ICD-10-CM | POA: Diagnosis not present

## 2021-09-07 DIAGNOSIS — J449 Chronic obstructive pulmonary disease, unspecified: Secondary | ICD-10-CM | POA: Diagnosis not present

## 2021-09-07 MED ORDER — DULOXETINE HCL 30 MG PO CPEP: 30.0000 mg | ORAL_CAPSULE | Freq: Every day | ORAL | 3 refills | Status: DC

## 2021-09-07 MED ORDER — ONDANSETRON HCL 4 MG PO TABS: 4.0000 mg | ORAL_TABLET | Freq: Three times a day (TID) | ORAL | 0 refills | Status: DC | PRN

## 2021-09-07 NOTE — Progress Notes (Signed)
Subjective:    Patient ID: Sharon Swanson, female    DOB: 1961-01-27, 60 y.o.   MRN: [id number]  HPI Pt is a 60 yr old female with morbid obesity- BMI of 48- weight 311 lbs; with DM- last A1c 5 months ago was 6.4; on Home 3L- of O2 [place]- for a few years- COPD; still actively smoking- 2 cigarettes/day. HTN;  Has kidney doctor- CKD stage II?;  Here for evaluation of chronic back pain.    Pain is more throbbing-R low back pain.  If stands for prolonged time- 10 minutes- Will have R>L lateral thigh numbness if stands. Also pinches to left side- at night, really bothers her.   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   Sleeps sitting up in recliner- for 2-3 years.    Tried: Tylenol- doesn't help much Aleve- has kidney doctor- puts her to sleep.  Tramadol didn't help- took the edge off. Not as much "as needs to help".  Gabapentin 300 mg TID- doesn't feel like it helps- been on for years.  Robaxin- takes every day- but "doesn't help".  Never tried epidural steroid injection.     IMPRESSION: 1. L4-5: Broad-based disc herniation more prominent towards the right. Moderate multifactorial canal stenosis. Bilateral lateral recess stenosis right worse than left with likely neural compression, particularly on right. 2. L2-3 and L3-4: Disc protrusions. Moderate multifactorial stenosis at both of those levels that could be symptomatic. 3. L5-S1: Shallow disc protrusion more towards the left but without apparent compressive stenosis.     Pain Inventory Average Pain 10 Pain Right Now 10 My pain is constant, sharp, and aching  In the last 24 hours, has pain interfered with the following? General activity 1 Relation with others 3 Enjoyment of life 0 What TIME of day is your pain at its worst? night Sleep  (in general) Poor  Pain is worse with: standing Pain improves with: medication Relief from Meds: 9  Mobility Use a walker Not able to climb steps  Function Disable for maybe 2 years  Neuro / Psych No problem in this area     Family History  Problem Relation Age of Onset   Hypertension Mother    Diabetes Other    Diabetes Maternal Grandmother    Hypertension Maternal Grandmother    Social History   Socioeconomic History   Marital status: Married    Spouse name: Not on file   Number of children: Not on file   Years of education: Not on file   Highest education level: Not on file  Occupational History   Not on file  Tobacco Use   Smoking status: Every Day    Packs/day: 0.10    Years: 39.00    Pack years: 3.90    Types: Cigarettes    Start date: 09/25/1980   Smokeless tobacco: Never   Tobacco comments:    pt states she smoke 2 cigerettes a day. States she uses nicotine patches.  Vaping Use   Vaping Use: Never used  Substance and Sexual Activity   Alcohol use: No   Drug use: No   Sexual activity: Not Currently    Partners: Male  Other Topics Concern   Not on file  Social History Narrative   Not on file   Social Determinants of  Health   Financial Resource Strain: Medium Risk   Difficulty of Paying Living Expenses: Somewhat hard  Food Insecurity: No Food Insecurity   Worried About Running Out of Food in the Last Year: Never true   Ran Out of Food in the Last Year: Never true  Transportation Needs: No Transportation Needs   Lack of Transportation (Medical): No   Lack of Transportation (Non-Medical): No  Physical Activity: Inactive   Days of Exercise per Week: 0 days   Minutes of Exercise per Session: 0 min  Stress: Stress Concern Present   Feeling of Stress : To some extent  Social Connections: Socially Integrated   Frequency of Communication with Friends and Family: Three times a week   Frequency of Social Gatherings with Friends and Family: Three  times a week   Attends Religious Services: More than 4 times per year   Active Member of Clubs or Organizations: Yes   Attends Archivist Meetings: 1 to 4 times per year   Marital Status: Married   Past Surgical History:  Procedure Laterality Date   ORTHOPEDIC SURGERY     R ankle, tendonitis   Past Surgical History:  Procedure Laterality Date   ORTHOPEDIC SURGERY     R ankle, tendonitis   Past Medical History:  Diagnosis Date   Abnormal Pap smear of cervix 10/2020   Acid reflux    Asthma    Atypical squamous cells of undetermined significance (ASC-US) on cervical Pap smear 10/2020   Bilateral lower extremity edema 01/2020   COPD (chronic obstructive pulmonary disease) ([place])    CPAP (continuous positive airway pressure) dependence    Diabetes ([place])    Diabetes mellitus without complication ([place])    Type II   Fatty liver 11/08/2011   Fibroids 11/08/2011   Hyperglycemia    Hyperlipidemia 05/2020   Hypertension    Microalbuminuria 01/2020   Obesity (BMI 30-39.9) 11/08/2011   Osteoarthritis of right acromioclavicular joint    Pancreatitis 11/08/2011   Rotator cuff tear, right    Shortness of breath on exertion 01/2020   Sleep apnea    Ventral hernia    Vitamin D deficiency 05/2020   There were no vitals taken for this visit.  Opioid Risk Score:   Fall Risk Score:  `1  Depression screen PHQ 2/9  Depression screen [facility] 2/9 06/08/2021 06/28/2020 05/28/2020 02/25/2020  Decreased Interest 0 0 0 0  Down, Depressed, Hopeless 0 0 0 0  PHQ - 2 Score 0 0 0 0     Review of Systems  Constitutional:  Positive for unexpected weight change.       Weight Gain  Respiratory:  Positive for shortness of breath and wheezing.   Endocrine:       High blood sugar  Musculoskeletal:  Positive for back pain and gait problem.  All other systems reviewed and are negative.     Objective:   Physical Exam Awake, alert, appropriate, tearful; on O2 by [place]; using rollator to transfer/walk,  NAD MS: HF 4+/5; secondary to weakness vs pain;  KE/KF 5-/5- less painful DF and PF 5/5 B/L   (+) SLR R>>L TTP over R lumbar paraspinals- more around L4/S1 than L1-L3 Also TTP around L middle lumbar, but worse on R  Neuro: Intact to light touch in all extremities B/L   Gait- antalgic gait        Assessment & Plan:   Pt is a 60 yr old female with morbid obesity- BMI of 48- weight  311 lbs; with DM- last A1c 5 months ago was 6.4; on Home 3L- of O2 - for a few years- COPD; still actively smoking- 2 cigarettes/day. HTN;  Has kidney doctor- CKD stage II?;  Here for evaluation of chronic back pain.   Meralgia Parasthetica B/L-  which is a pinched neve in groin that causes numbness, and nerve pain sometimes, usually due to weight- went over this with her and explained it's NOT part of back pain.   2. Has lost 10 lbs- weight 311 lbs- can CONTRIBUTE to pain, not be the cause- so our goal is to work on weight control.   3. Pedal bike- At [facility] for $30-40- get one can change resistance on it.  5 days/week is goal.   4. Pt says concerned insurance doesn't cover outpt PT- - [place] for Home exercise program.  Call them to make appointment/see if insurance covers therapy.   5. Duloxetine/Cymbalta-  .  Duloxetine /Cymbalta 30 mg nightly x 1 week  Then 60 mg nightly- for nerve pain  1% of patients can have nausea with Duloxetine- call me if needs an anti-nausea medicine. Can also cause mild dry mouth/dry eyes and mild constipation. Continue Gabapentin for now- will wait to increase or stop for now.    6.  More pathway/signals to the brain- so light touch, hearing music, things like that actually decrease the amount of pain sensations that can get to the brain at once.  Remember highway going into a tunnel- has to narrow down how many signals to the brain.   7. Has arthritis and compression in spine-  and it takes time to get control of pain- don't want to  send for surgery right now- surgery doesn't always fix pain; just weakness- will work with meds first; then might send for epidural steroid injections. Can cause blood sugars to spike (has [employer]).   8. F/U in 3 months- but call me in 1 month to let me know how things going.     9. At phone check in, will change muscle relaxant.    I spent a total of 49 minutes on total visit- discussing/education-

## 2021-09-07 NOTE — Patient Instructions (Addendum)
Pt is a 60 yr old female with morbid obesity- BMI of 48- weight 311 lbs; with DM- last A1c 5 months ago was 6.4; on Home 3L- of O2 [place]- for a few years- COPD; still actively smoking- 2 cigarettes/day. HTN;  Has kidney doctor- CKD stage II?;  Here for evaluation of chronic back pain.   Meralgia Parasthetica B/L-  which is a pinched neve in groin that causes numbness, and nerve pain sometimes, usually due to weight- went over this with her and explained it's NOT part of back pain.   2. Has lost 10 lbs- weight 311 lbs- can CONTRIBUTE to pain, not be the cause- so our goal is to work on weight control.   3. Pedal bike- At [facility] for $30-40- get one can change resistance on it.  5 days/week is goal.   4. Pt says concerned insurance doesn't cover outpt PT- - [place] for Home exercise program.  Call them to make appointment/see if insurance covers therapy.   5. Duloxetine/Cymbalta-  .  Duloxetine /Cymbalta 30 mg nightly x 1 week  Then 60 mg nightly- for nerve pain  1% of patients can have nausea with Duloxetine- call me if needs an anti-nausea medicine. Can also cause mild dry mouth/dry eyes and mild constipation. Continue Gabapentin for now- will wait to increase or stop for now.    6.  More pathway/signals to the brain- so light touch, hearing music, things like that actually decrease the amount of pain sensations that can get to the brain at once.  Remember highway going into a tunnel- has to narrow down how many signals to the brain.   7. Has arthritis and compression in spine-  and it takes time to get control of pain- don't want to send for surgery right now- surgery doesn't always fix pain; just weakness- will work with meds first; then might send for epidural steroid injections. Can cause blood sugars to spike (has [employer]).   8. F/U in 3 months- but call me in 1 month to let me know how things going.    9.9. At phone check in, will change muscle  relaxant.

## 2021-09-08 DIAGNOSIS — Z794 Long term (current) use of insulin: Secondary | ICD-10-CM | POA: Diagnosis not present

## 2021-09-08 DIAGNOSIS — E119 Type 2 diabetes mellitus without complications: Secondary | ICD-10-CM | POA: Diagnosis not present

## 2021-09-10 DIAGNOSIS — J449 Chronic obstructive pulmonary disease, unspecified: Secondary | ICD-10-CM | POA: Diagnosis not present

## 2021-09-10 DIAGNOSIS — G4733 Obstructive sleep apnea (adult) (pediatric): Secondary | ICD-10-CM | POA: Diagnosis not present

## 2021-09-28 ENCOUNTER — Ambulatory Visit: Payer: Medicare Other | Admitting: Physical Therapy

## 2021-09-29 DIAGNOSIS — R739 Hyperglycemia, unspecified: Secondary | ICD-10-CM | POA: Diagnosis not present

## 2021-09-29 DIAGNOSIS — M549 Dorsalgia, unspecified: Secondary | ICD-10-CM | POA: Diagnosis not present

## 2021-09-29 DIAGNOSIS — R42 Dizziness and giddiness: Secondary | ICD-10-CM | POA: Diagnosis not present

## 2021-09-29 DIAGNOSIS — R1084 Generalized abdominal pain: Secondary | ICD-10-CM | POA: Diagnosis not present

## 2021-09-29 DIAGNOSIS — R140 Abdominal distension (gaseous): Secondary | ICD-10-CM | POA: Diagnosis not present

## 2021-09-30 ENCOUNTER — Emergency Department (HOSPITAL_COMMUNITY): Payer: Medicare Other

## 2021-09-30 ENCOUNTER — Encounter (HOSPITAL_COMMUNITY): Payer: Self-pay

## 2021-09-30 ENCOUNTER — Encounter (HOSPITAL_COMMUNITY)
Admission: EM | Disposition: A | Discharge status: Home / Self Care | Payer: Self-pay | Source: Home / Self Care | Attending: Internal Medicine

## 2021-09-30 ENCOUNTER — Other Ambulatory Visit: Payer: Self-pay

## 2021-09-30 ENCOUNTER — Inpatient Hospital Stay (HOSPITAL_COMMUNITY): Payer: Medicare Other | Admitting: Certified Registered Nurse Anesthetist

## 2021-09-30 ENCOUNTER — Inpatient Hospital Stay (HOSPITAL_COMMUNITY)
Admission: EM | Admit: 2021-09-30 | Discharge: 2021-10-06 | DRG: 354 | Disposition: A | Discharge status: Home / Self Care | Payer: Medicare Other | Attending: Family Medicine | Admitting: Family Medicine

## 2021-09-30 ENCOUNTER — Inpatient Hospital Stay (HOSPITAL_COMMUNITY): Payer: Medicare Other

## 2021-09-30 DIAGNOSIS — E559 Vitamin D deficiency, unspecified: Secondary | ICD-10-CM | POA: Diagnosis not present

## 2021-09-30 DIAGNOSIS — Z833 Family history of diabetes mellitus: Secondary | ICD-10-CM

## 2021-09-30 DIAGNOSIS — R5381 Other malaise: Secondary | ICD-10-CM | POA: Diagnosis not present

## 2021-09-30 DIAGNOSIS — E119 Type 2 diabetes mellitus without complications: Secondary | ICD-10-CM | POA: Diagnosis not present

## 2021-09-30 DIAGNOSIS — K420 Umbilical hernia with obstruction, without gangrene: Principal | ICD-10-CM | POA: Diagnosis present

## 2021-09-30 DIAGNOSIS — R609 Edema, unspecified: Secondary | ICD-10-CM

## 2021-09-30 DIAGNOSIS — J9610 Chronic respiratory failure, unspecified whether with hypoxia or hypercapnia: Secondary | ICD-10-CM | POA: Diagnosis present

## 2021-09-30 DIAGNOSIS — G4733 Obstructive sleep apnea (adult) (pediatric): Secondary | ICD-10-CM | POA: Diagnosis present

## 2021-09-30 DIAGNOSIS — I700 Atherosclerosis of aorta: Secondary | ICD-10-CM | POA: Diagnosis not present

## 2021-09-30 DIAGNOSIS — Z20822 Contact with and (suspected) exposure to covid-19: Secondary | ICD-10-CM | POA: Diagnosis not present

## 2021-09-30 DIAGNOSIS — Z79899 Other long term (current) drug therapy: Secondary | ICD-10-CM | POA: Diagnosis not present

## 2021-09-30 DIAGNOSIS — M79604 Pain in right leg: Secondary | ICD-10-CM | POA: Diagnosis not present

## 2021-09-30 DIAGNOSIS — Z6841 Body Mass Index (BMI) 40.0 and over, adult: Secondary | ICD-10-CM | POA: Diagnosis not present

## 2021-09-30 DIAGNOSIS — J449 Chronic obstructive pulmonary disease, unspecified: Secondary | ICD-10-CM | POA: Diagnosis present

## 2021-09-30 DIAGNOSIS — R112 Nausea with vomiting, unspecified: Secondary | ICD-10-CM | POA: Diagnosis not present

## 2021-09-30 DIAGNOSIS — E785 Hyperlipidemia, unspecified: Secondary | ICD-10-CM | POA: Diagnosis present

## 2021-09-30 DIAGNOSIS — K219 Gastro-esophageal reflux disease without esophagitis: Secondary | ICD-10-CM | POA: Diagnosis not present

## 2021-09-30 DIAGNOSIS — K6389 Other specified diseases of intestine: Secondary | ICD-10-CM | POA: Diagnosis not present

## 2021-09-30 DIAGNOSIS — K429 Umbilical hernia without obstruction or gangrene: Secondary | ICD-10-CM | POA: Diagnosis not present

## 2021-09-30 DIAGNOSIS — J9611 Chronic respiratory failure with hypoxia: Secondary | ICD-10-CM | POA: Diagnosis not present

## 2021-09-30 DIAGNOSIS — M79605 Pain in left leg: Secondary | ICD-10-CM | POA: Diagnosis not present

## 2021-09-30 DIAGNOSIS — I10 Essential (primary) hypertension: Secondary | ICD-10-CM | POA: Diagnosis not present

## 2021-09-30 DIAGNOSIS — Z8249 Family history of ischemic heart disease and other diseases of the circulatory system: Secondary | ICD-10-CM

## 2021-09-30 DIAGNOSIS — K5989 Other specified functional intestinal disorders: Secondary | ICD-10-CM | POA: Diagnosis not present

## 2021-09-30 DIAGNOSIS — I110 Hypertensive heart disease with heart failure: Secondary | ICD-10-CM | POA: Diagnosis present

## 2021-09-30 DIAGNOSIS — F17210 Nicotine dependence, cigarettes, uncomplicated: Secondary | ICD-10-CM | POA: Diagnosis not present

## 2021-09-30 DIAGNOSIS — K5669 Other partial intestinal obstruction: Secondary | ICD-10-CM | POA: Diagnosis not present

## 2021-09-30 DIAGNOSIS — R7309 Other abnormal glucose: Secondary | ICD-10-CM

## 2021-09-30 DIAGNOSIS — Z0189 Encounter for other specified special examinations: Secondary | ICD-10-CM

## 2021-09-30 DIAGNOSIS — K56609 Unspecified intestinal obstruction, unspecified as to partial versus complete obstruction: Secondary | ICD-10-CM | POA: Diagnosis present

## 2021-09-30 DIAGNOSIS — K760 Fatty (change of) liver, not elsewhere classified: Secondary | ICD-10-CM | POA: Diagnosis not present

## 2021-09-30 DIAGNOSIS — R739 Hyperglycemia, unspecified: Secondary | ICD-10-CM

## 2021-09-30 DIAGNOSIS — I2510 Atherosclerotic heart disease of native coronary artery without angina pectoris: Secondary | ICD-10-CM | POA: Diagnosis present

## 2021-09-30 DIAGNOSIS — I5032 Chronic diastolic (congestive) heart failure: Secondary | ICD-10-CM | POA: Diagnosis not present

## 2021-09-30 DIAGNOSIS — R911 Solitary pulmonary nodule: Secondary | ICD-10-CM | POA: Diagnosis not present

## 2021-09-30 DIAGNOSIS — Z7984 Long term (current) use of oral hypoglycemic drugs: Secondary | ICD-10-CM

## 2021-09-30 DIAGNOSIS — Z794 Long term (current) use of insulin: Secondary | ICD-10-CM | POA: Diagnosis not present

## 2021-09-30 DIAGNOSIS — R140 Abdominal distension (gaseous): Secondary | ICD-10-CM

## 2021-09-30 DIAGNOSIS — Z4682 Encounter for fitting and adjustment of non-vascular catheter: Secondary | ICD-10-CM | POA: Diagnosis not present

## 2021-09-30 DIAGNOSIS — E1165 Type 2 diabetes mellitus with hyperglycemia: Secondary | ICD-10-CM

## 2021-09-30 DIAGNOSIS — Z9981 Dependence on supplemental oxygen: Secondary | ICD-10-CM

## 2021-09-30 DIAGNOSIS — R531 Weakness: Secondary | ICD-10-CM

## 2021-09-30 HISTORY — PX: UMBILICAL HERNIA REPAIR: SHX196

## 2021-09-30 LAB — CBC WITH DIFFERENTIAL/PLATELET
Abs Immature Granulocytes: 0.02 10*3/uL (ref 0.00–0.07)
Basophils Absolute: 0 10*3/uL (ref 0.0–0.1)
Basophils Relative: 0 %
Eosinophils Absolute: 0.1 10*3/uL (ref 0.0–0.5)
Eosinophils Relative: 1 %
HCT: 47.4 % — ABNORMAL HIGH (ref 36.0–46.0)
Hemoglobin: 14.9 g/dL (ref 12.0–15.0)
Immature Granulocytes: 0 %
Lymphocytes Relative: 11 %
Lymphs Abs: 1 10*3/uL (ref 0.7–4.0)
MCH: 28.9 pg (ref 26.0–34.0)
MCHC: 31.4 g/dL (ref 30.0–36.0)
MCV: 91.9 fL (ref 80.0–100.0)
Monocytes Absolute: 0.5 10*3/uL (ref 0.1–1.0)
Monocytes Relative: 5 %
Neutro Abs: 8 10*3/uL — ABNORMAL HIGH (ref 1.7–7.7)
Neutrophils Relative %: 83 %
Platelets: 268 10*3/uL (ref 150–400)
RBC: 5.16 MIL/uL — ABNORMAL HIGH (ref 3.87–5.11)
RDW: 13.8 % (ref 11.5–15.5)
WBC: 9.6 10*3/uL (ref 4.0–10.5)
nRBC: 0 % (ref 0.0–0.2)

## 2021-09-30 LAB — URINALYSIS, ROUTINE W REFLEX MICROSCOPIC
Bilirubin Urine: NEGATIVE
Glucose, UA: 50 mg/dL — AB
Hgb urine dipstick: NEGATIVE
Ketones, ur: NEGATIVE mg/dL
Leukocytes,Ua: NEGATIVE
Nitrite: NEGATIVE
Protein, ur: >=300 mg/dL — AB
Specific Gravity, Urine: 1.035 — ABNORMAL HIGH (ref 1.005–1.030)
pH: 5 (ref 5.0–8.0)

## 2021-09-30 LAB — RESP PANEL BY RT-PCR (FLU A&B, COVID) ARPGX2
Influenza A by PCR: NEGATIVE
Influenza B by PCR: NEGATIVE
SARS Coronavirus 2 by RT PCR: NEGATIVE

## 2021-09-30 LAB — COMPREHENSIVE METABOLIC PANEL
ALT: 34 U/L (ref 0–44)
AST: 27 U/L (ref 15–41)
Albumin: 4.6 g/dL (ref 3.5–5.0)
Alkaline Phosphatase: 79 U/L (ref 38–126)
Anion gap: 12 (ref 5–15)
BUN: 16 mg/dL (ref 6–20)
CO2: 25 mmol/L (ref 22–32)
Calcium: 10.1 mg/dL (ref 8.9–10.3)
Chloride: 98 mmol/L (ref 98–111)
Creatinine, Ser: 0.73 mg/dL (ref 0.44–1.00)
GFR, Estimated: >60 mL/min (ref >60)
Glucose, Bld: 224 mg/dL — ABNORMAL HIGH (ref 70–99)
Potassium: 4.1 mmol/L (ref 3.5–5.1)
Sodium: 135 mmol/L (ref 135–145)
Total Bilirubin: 0.7 mg/dL (ref 0.3–1.2)
Total Protein: 8.4 g/dL — ABNORMAL HIGH (ref 6.5–8.1)

## 2021-09-30 LAB — GLUCOSE, CAPILLARY
Glucose-Capillary: 140 mg/dL — ABNORMAL HIGH (ref 70–99)
Glucose-Capillary: 163 mg/dL — ABNORMAL HIGH (ref 70–99)
Glucose-Capillary: 181 mg/dL — ABNORMAL HIGH (ref 70–99)
Glucose-Capillary: 157 mg/dL — ABNORMAL HIGH (ref 70–99)

## 2021-09-30 LAB — LIPASE, BLOOD: Lipase: 46 U/L (ref 11–51)

## 2021-09-30 SURGERY — REPAIR, HERNIA, UMBILICAL, ADULT
Anesthesia: General | Site: Abdomen

## 2021-09-30 MED ORDER — BUPIVACAINE-EPINEPHRINE (PF) 0.25% -1:200000 IJ SOLN
INTRAMUSCULAR | Status: AC
  Filled 2021-09-30: qty 30

## 2021-09-30 MED ORDER — SODIUM CHLORIDE 0.9 % IV SOLN: INTRAVENOUS | Status: DC

## 2021-09-30 MED ORDER — PHENYLEPHRINE 40 MCG/ML (10ML) SYRINGE FOR IV PUSH (FOR BLOOD PRESSURE SUPPORT)
PREFILLED_SYRINGE | INTRAVENOUS | Status: AC
  Filled 2021-09-30: qty 10

## 2021-09-30 MED ORDER — ALBUTEROL SULFATE (2.5 MG/3ML) 0.083% IN NEBU
INHALATION_SOLUTION | RESPIRATORY_TRACT | Status: AC
  Filled 2021-09-30: qty 3

## 2021-09-30 MED ORDER — ONDANSETRON HCL 4 MG/2ML IJ SOLN
INTRAMUSCULAR | Status: AC
  Filled 2021-09-30: qty 2

## 2021-09-30 MED ORDER — LACTATED RINGERS IV SOLN: INTRAVENOUS | Status: DC

## 2021-09-30 MED ORDER — IOHEXOL 350 MG/ML SOLN
80.0000 mL | Freq: Once | INTRAVENOUS | Status: AC | PRN
  Administered 2021-09-30: 80 mL via INTRAVENOUS

## 2021-09-30 MED ORDER — FENTANYL CITRATE (PF) 100 MCG/2ML IJ SOLN
INTRAMUSCULAR | Status: AC
  Filled 2021-09-30: qty 2

## 2021-09-30 MED ORDER — FENTANYL CITRATE PF 50 MCG/ML IJ SOSY: 25.0000 ug | PREFILLED_SYRINGE | INTRAMUSCULAR | Status: DC | PRN

## 2021-09-30 MED ORDER — ROCURONIUM BROMIDE 10 MG/ML (PF) SYRINGE
PREFILLED_SYRINGE | INTRAVENOUS | Status: DC | PRN
  Administered 2021-09-30: 60 mg via INTRAVENOUS
  Administered 2021-09-30: 20 mg via INTRAVENOUS

## 2021-09-30 MED ORDER — DEXAMETHASONE SODIUM PHOSPHATE 10 MG/ML IJ SOLN
INTRAMUSCULAR | Status: AC
  Filled 2021-09-30: qty 1

## 2021-09-30 MED ORDER — LIDOCAINE HCL (PF) 2 % IJ SOLN
INTRAMUSCULAR | Status: AC
  Filled 2021-09-30: qty 5

## 2021-09-30 MED ORDER — MIDAZOLAM HCL 5 MG/5ML IJ SOLN
INTRAMUSCULAR | Status: DC | PRN
  Administered 2021-09-30: 2 mg via INTRAVENOUS

## 2021-09-30 MED ORDER — CEFAZOLIN IN SODIUM CHLORIDE 3-0.9 GM/100ML-% IV SOLN
3.0000 g | INTRAVENOUS | Status: AC
  Administered 2021-09-30: 3 g via INTRAVENOUS
  Filled 2021-09-30: qty 100

## 2021-09-30 MED ORDER — OXYCODONE HCL 5 MG/5ML PO SOLN: 5.0000 mg | Freq: Once | ORAL | Status: DC | PRN

## 2021-09-30 MED ORDER — SODIUM CHLORIDE (PF) 0.9 % IJ SOLN
INTRAMUSCULAR | Status: AC
  Filled 2021-09-30: qty 50

## 2021-09-30 MED ORDER — BUPIVACAINE-EPINEPHRINE (PF) 0.25% -1:200000 IJ SOLN
INTRAMUSCULAR | Status: DC | PRN
  Administered 2021-09-30: 30 mL

## 2021-09-30 MED ORDER — SUCCINYLCHOLINE CHLORIDE 200 MG/10ML IV SOSY
PREFILLED_SYRINGE | INTRAVENOUS | Status: DC | PRN
  Administered 2021-09-30: 180 mg via INTRAVENOUS

## 2021-09-30 MED ORDER — SUCCINYLCHOLINE CHLORIDE 200 MG/10ML IV SOSY
PREFILLED_SYRINGE | INTRAVENOUS | Status: AC
  Filled 2021-09-30: qty 10

## 2021-09-30 MED ORDER — ALBUTEROL SULFATE (2.5 MG/3ML) 0.083% IN NEBU
2.5000 mg | INHALATION_SOLUTION | Freq: Four times a day (QID) | RESPIRATORY_TRACT | Status: DC | PRN
  Administered 2021-09-30: 2.5 mg via RESPIRATORY_TRACT

## 2021-09-30 MED ORDER — ONDANSETRON HCL 4 MG/2ML IJ SOLN: 4.0000 mg | Freq: Once | INTRAMUSCULAR | Status: DC | PRN

## 2021-09-30 MED ORDER — INSULIN ASPART 100 UNIT/ML IJ SOLN
0.0000 [IU] | Freq: Three times a day (TID) | INTRAMUSCULAR | Status: DC
  Administered 2021-09-30: 3 [IU] via SUBCUTANEOUS
  Administered 2021-10-01 – 2021-10-03 (×8): 2 [IU] via SUBCUTANEOUS
  Administered 2021-10-04: 3 [IU] via SUBCUTANEOUS
  Administered 2021-10-04 – 2021-10-05 (×3): 2 [IU] via SUBCUTANEOUS
  Administered 2021-10-05 – 2021-10-06 (×3): 3 [IU] via SUBCUTANEOUS
  Administered 2021-10-06: 2 [IU] via SUBCUTANEOUS
  Filled 2021-09-30: qty 0.15

## 2021-09-30 MED ORDER — OXYCODONE HCL 5 MG PO TABS: 5.0000 mg | ORAL_TABLET | Freq: Once | ORAL | Status: DC | PRN

## 2021-09-30 MED ORDER — MORPHINE SULFATE (PF) 4 MG/ML IV SOLN
4.0000 mg | INTRAVENOUS | Status: DC | PRN
  Administered 2021-09-30 – 2021-10-03 (×9): 4 mg via INTRAVENOUS
  Filled 2021-09-30 (×9): qty 1

## 2021-09-30 MED ORDER — SCOPOLAMINE 1 MG/3DAYS TD PT72
1.0000 | MEDICATED_PATCH | TRANSDERMAL | Status: DC
  Administered 2021-09-30: 1.5 mg via TRANSDERMAL
  Filled 2021-09-30: qty 1

## 2021-09-30 MED ORDER — FENTANYL CITRATE (PF) 100 MCG/2ML IJ SOLN
INTRAMUSCULAR | Status: DC | PRN
  Administered 2021-09-30 (×3): 50 ug via INTRAVENOUS

## 2021-09-30 MED ORDER — SODIUM CHLORIDE 0.9 % IR SOLN
Status: DC | PRN
  Administered 2021-09-30: 1000 mL

## 2021-09-30 MED ORDER — HYDROMORPHONE HCL 1 MG/ML IJ SOLN
0.5000 mg | INTRAMUSCULAR | Status: DC | PRN
  Administered 2021-09-30 – 2021-10-03 (×11): 0.5 mg via INTRAVENOUS
  Filled 2021-09-30 (×11): qty 0.5

## 2021-09-30 MED ORDER — HYDRALAZINE HCL 20 MG/ML IJ SOLN
10.0000 mg | Freq: Three times a day (TID) | INTRAMUSCULAR | Status: DC | PRN
  Administered 2021-10-01: 10 mg via INTRAVENOUS
  Filled 2021-09-30: qty 1

## 2021-09-30 MED ORDER — ONDANSETRON HCL 4 MG/2ML IJ SOLN
4.0000 mg | Freq: Once | INTRAMUSCULAR | Status: AC
  Administered 2021-09-30: 4 mg via INTRAVENOUS
  Filled 2021-09-30: qty 2

## 2021-09-30 MED ORDER — MIDAZOLAM HCL 2 MG/2ML IJ SOLN
INTRAMUSCULAR | Status: AC
  Filled 2021-09-30: qty 2

## 2021-09-30 MED ORDER — IPRATROPIUM-ALBUTEROL 0.5-2.5 (3) MG/3ML IN SOLN: 3.0000 mL | Freq: Four times a day (QID) | RESPIRATORY_TRACT | Status: DC | PRN

## 2021-09-30 MED ORDER — CHLORHEXIDINE GLUCONATE 0.12 % MT SOLN
15.0000 mL | Freq: Once | OROMUCOSAL | Status: AC
  Administered 2021-09-30: 15 mL via OROMUCOSAL

## 2021-09-30 MED ORDER — PHENYLEPHRINE 40 MCG/ML (10ML) SYRINGE FOR IV PUSH (FOR BLOOD PRESSURE SUPPORT)
PREFILLED_SYRINGE | INTRAVENOUS | Status: DC | PRN
  Administered 2021-09-30 (×2): 120 ug via INTRAVENOUS
  Administered 2021-09-30: 160 ug via INTRAVENOUS
  Administered 2021-09-30: 120 ug via INTRAVENOUS

## 2021-09-30 MED ORDER — DEXAMETHASONE SODIUM PHOSPHATE 4 MG/ML IJ SOLN
INTRAMUSCULAR | Status: DC | PRN
  Administered 2021-09-30: 4 mg via INTRAVENOUS

## 2021-09-30 MED ORDER — MORPHINE SULFATE (PF) 4 MG/ML IV SOLN
4.0000 mg | Freq: Once | INTRAVENOUS | Status: AC
  Administered 2021-09-30: 4 mg via INTRAVENOUS
  Filled 2021-09-30: qty 1

## 2021-09-30 MED ORDER — ALBUTEROL SULFATE HFA 108 (90 BASE) MCG/ACT IN AERS
INHALATION_SPRAY | RESPIRATORY_TRACT | Status: DC | PRN
  Administered 2021-09-30: 5 via RESPIRATORY_TRACT

## 2021-09-30 MED ORDER — ACETAMINOPHEN 500 MG PO TABS
1000.0000 mg | ORAL_TABLET | Freq: Once | ORAL | Status: AC
  Administered 2021-09-30: 1000 mg via ORAL
  Filled 2021-09-30: qty 2

## 2021-09-30 MED ORDER — AMISULPRIDE (ANTIEMETIC) 5 MG/2ML IV SOLN: 10.0000 mg | Freq: Once | INTRAVENOUS | Status: DC | PRN

## 2021-09-30 MED ORDER — SUGAMMADEX SODIUM 200 MG/2ML IV SOLN
INTRAVENOUS | Status: DC | PRN
  Administered 2021-09-30: 400 mg via INTRAVENOUS

## 2021-09-30 MED ORDER — ROCURONIUM BROMIDE 10 MG/ML (PF) SYRINGE
PREFILLED_SYRINGE | INTRAVENOUS | Status: AC
  Filled 2021-09-30: qty 10

## 2021-09-30 MED ORDER — CHLORHEXIDINE GLUCONATE CLOTH 2 % EX PADS: 6.0000 | MEDICATED_PAD | Freq: Once | CUTANEOUS | Status: DC

## 2021-09-30 MED ORDER — LIDOCAINE 2% (20 MG/ML) 5 ML SYRINGE
INTRAMUSCULAR | Status: DC | PRN
  Administered 2021-09-30: 60 mg via INTRAVENOUS

## 2021-09-30 MED ORDER — ALBUTEROL SULFATE HFA 108 (90 BASE) MCG/ACT IN AERS
INHALATION_SPRAY | RESPIRATORY_TRACT | Status: AC
  Filled 2021-09-30: qty 6.7

## 2021-09-30 MED ORDER — PROPOFOL 10 MG/ML IV BOLUS
INTRAVENOUS | Status: DC | PRN
  Administered 2021-09-30: 200 mg via INTRAVENOUS

## 2021-09-30 MED ORDER — ONDANSETRON HCL 4 MG/2ML IJ SOLN
INTRAMUSCULAR | Status: DC | PRN
  Administered 2021-09-30: 4 mg via INTRAVENOUS

## 2021-09-30 SURGICAL SUPPLY — 48 items
APL SKNCLS NONHYPOALLERGENIC (GAUZE/BANDAGES/DRESSINGS) ×1
APL SKNCLS STERI-STRIP NONHPOA (GAUZE/BANDAGES/DRESSINGS) ×1
BAG COUNTER SPONGE SURGICOUNT (BAG) IMPLANT
BAG SPNG CNTER NS LX DISP (BAG)
BENZOIN TINCTURE PRP APPL 2/3 (GAUZE/BANDAGES/DRESSINGS) ×1 IMPLANT
BINDER ABDOMINAL 12 ML 46-62 (SOFTGOODS) ×1 IMPLANT
CLSR STERI-STRIP ANTIMIC 1/2X4 (GAUZE/BANDAGES/DRESSINGS) ×1 IMPLANT
COVER SURGICAL LIGHT HANDLE (MISCELLANEOUS) ×2 IMPLANT
DECANTER SPIKE VIAL GLASS SM (MISCELLANEOUS) ×2 IMPLANT
DRAIN CHANNEL 19F RND (DRAIN) IMPLANT
DRAPE LAPAROTOMY T 98X78 PEDS (DRAPES) IMPLANT
DRSG OPSITE POSTOP 4X6 (GAUZE/BANDAGES/DRESSINGS) ×1 IMPLANT
ELECT REM PT RETURN 15FT ADLT (MISCELLANEOUS) ×2 IMPLANT
EVACUATOR SILICONE 100CC (DRAIN) IMPLANT
GAUZE SPONGE 4X4 12PLY STRL (GAUZE/BANDAGES/DRESSINGS) IMPLANT
GLOVE SRG 8 PF TXTR STRL LF DI (GLOVE) ×1 IMPLANT
GLOVE SURG ENC MOIS LTX SZ7 (GLOVE) ×2 IMPLANT
GLOVE SURG POLY ORTHO LF SZ7.5 (GLOVE) ×2 IMPLANT
GLOVE SURG UNDER POLY LF SZ8 (GLOVE) ×2
GOWN STRL REUS W/TWL XL LVL3 (GOWN DISPOSABLE) ×4 IMPLANT
KIT BASIN OR (CUSTOM PROCEDURE TRAY) ×2 IMPLANT
KIT TURNOVER KIT A (KITS) IMPLANT
MESH VENTRALEX ST 8CM LRG (Mesh General) ×1 IMPLANT
NEEDLE HYPO 22GX1.5 SAFETY (NEEDLE) IMPLANT
NS IRRIG 1000ML POUR BTL (IV SOLUTION) ×2 IMPLANT
PACK GENERAL/GYN (CUSTOM PROCEDURE TRAY) ×2 IMPLANT
SPONGE T-LAP 18X18 ~~LOC~~+RFID (SPONGE) ×3 IMPLANT
STAPLER VISISTAT 35W (STAPLE) IMPLANT
STRIP CLOSURE SKIN 1/2X4 (GAUZE/BANDAGES/DRESSINGS) ×1 IMPLANT
SUT ETHILON 2 0 PS N (SUTURE) IMPLANT
SUT MNCRL AB 4-0 PS2 18 (SUTURE) IMPLANT
SUT NOVA 0 T19/GS 22DT (SUTURE) IMPLANT
SUT NOVA 1 T20/GS 25DT (SUTURE) ×3 IMPLANT
SUT NOVA NAB DX-16 0-1 5-0 T12 (SUTURE) IMPLANT
SUT PDS AB 1 TP1 96 (SUTURE) IMPLANT
SUT PROLENE 0 CT 1 CR/8 (SUTURE) IMPLANT
SUT SILK 2 0 (SUTURE) ×2
SUT SILK 2-0 18XBRD TIE 12 (SUTURE) IMPLANT
SUT VIC AB 2-0 SH 27 (SUTURE)
SUT VIC AB 2-0 SH 27X BRD (SUTURE) IMPLANT
SUT VIC AB 3-0 SH 18 (SUTURE) ×2 IMPLANT
SUT VIC AB 3-0 SH 27 (SUTURE) ×4
SUT VIC AB 3-0 SH 27X BRD (SUTURE) IMPLANT
SYR CONTROL 10ML LL (SYRINGE) IMPLANT
TOWEL OR 17X26 10 PK STRL BLUE (TOWEL DISPOSABLE) ×2 IMPLANT
TOWEL OR NON WOVEN STRL DISP B (DISPOSABLE) ×2 IMPLANT
TRAY FOLEY MTR SLVR 14FR STAT (SET/KITS/TRAYS/PACK) IMPLANT
TRAY FOLEY MTR SLVR 16FR STAT (SET/KITS/TRAYS/PACK) IMPLANT

## 2021-09-30 NOTE — ED Triage Notes (Signed)
Pt arrives EMS from home with c/o periumbilical hernia pain. Pt sts hernia has been there x 3 years but became problematic today. Admits nausea, vomiting and difficulty using restroom.

## 2021-09-30 NOTE — Progress Notes (Signed)
MD Tseui paged because patient doesn't have any IV fluids ordered and is NPO with NG. Verbal order for NS at 146mls/hr given.

## 2021-09-30 NOTE — Progress Notes (Signed)
Pt arrived from PACU with NGT in place and hooked to [facility] with zero output. I tried to flush the NGT and met resistance, unable to make NGT work. Second RN assisted with flushing NGT and had the same results. On call MD was paged.

## 2021-09-30 NOTE — ED Provider Triage Note (Signed)
Emergency Medicine Provider Triage Evaluation Note  Sharon Swanson , a 61 y.o. female  was evaluated in triage.  Pt complains of abdominal pain.  Umbilical hernia x3 years but worse today.  States some vomiting and trouble with BM.  No fever.  Review of Systems  Positive: Abdominal pain Negative: fever  Physical Exam  BP (!) 166/75 (BP Location: Right Arm)    Pulse 98    Temp 97.8 F (36.6 C) (Oral)    Resp 20    Ht 5\' 7"  (1.702 m)    Wt 136.1 kg    SpO2 98%    BMI 46.99 kg/m   Gen:   Awake, no distress   Resp:  Normal effort  MSK:   Moves extremities without difficulty  Other:  Large umbilical hernia noted, no overlying skin changes, soft but does not tolerate full reduction  Medical Decision Making  Medically screening exam initiated at 12:36 AM.  Appropriate orders placed.  Sharon Swanson was informed that the remainder of the evaluation will be completed by another provider, this initial triage assessment does not replace that evaluation, and the importance of remaining in the ED until their evaluation is complete.  Large umbilical hernia.  Will not tolerate full reduction in triage due to pain.  Will check labs, CT.   Larene Pickett, PA-C 09/30/21 [phone]

## 2021-09-30 NOTE — ED Provider Notes (Signed)
[place] DEPT Provider Note   CSN: [id number] Arrival date & time: 09/30/21  0019     History  Chief Complaint  Patient presents with   Abdominal Pain    Sharon Swanson is a 61 y.o. female.  The history is provided by the patient and medical records.  Abdominal Pain Associated symptoms: nausea and vomiting     61 y.o. F with hx of GERD, COPD on chronic O2, CHF, HTN, DM, OSA, presenitng to the ED with abdominal pain.  States umbilical hernia x 3 years but no real issue until the past few days.  States hernia feels bigger, she is vomiting, and has been having issues with BM's.  Denies fever.  No prior abdominal surgeries in the past.  Home Medications Prior to Admission medications   Medication Sig Start Date End Date Taking? Authorizing Provider  albuterol (VENTOLIN HFA) 108 (90 Base) MCG/ACT inhaler USE 2 INHALATIONS BY MOUTH  INTO THE LUNGS EVERY 4  HOURS AS NEEDED FOR  WHEEZING OR SHORTNESS OF  BREATH 12/27/20   Vevelyn Francois, NP  atorvastatin (LIPITOR) 20 MG tablet Take 1 tablet (20 mg total) by mouth daily. 03/21/21   Vevelyn Francois, NP  carvedilol (COREG) 12.5 MG tablet Take 1 tablet (12.5 mg total) by mouth 2 (two) times daily. 02/02/21   Imogene Burn, PA-C  carvedilol (COREG) 6.25 MG tablet Take 6.25 mg by mouth 2 (two) times daily. 04/12/21   [provider]  cetirizine (ZYRTEC) 10 MG tablet Take 1 tablet (10 mg total) by mouth in the morning. 03/21/21   Vevelyn Francois, NP  Continuous Blood Gluc Receiver (FREESTYLE LIBRE 2 READER) DEVI Use as directed daily. 03/17/21   Vevelyn Francois, NP  Continuous Blood Gluc Sensor (FREESTYLE LIBRE 2 SENSOR) MISC 1 application by Does not apply route every 14 (fourteen) days. 03/17/21   Vevelyn Francois, NP  Continuous Blood Gluc Transmit (DEXCOM G6 TRANSMITTER) MISC Change every 10 days as needed/directed 03/18/21   Vevelyn Francois, NP  Continuous Blood Gluc Transmit (DEXCOM G6 TRANSMITTER) MISC Use  as directed Patient not taking: Reported on 06/08/2021 03/18/21   Vevelyn Francois, NP  docusate sodium (COLACE) 100 MG capsule Take 1 capsule (100 mg total) by mouth 3 (three) times daily as needed for mild constipation. 03/21/21   Vevelyn Francois, NP  Dulaglutide 0.75 MG/0.5ML SOPN INJECT 0.75 MG INTO THE SKIN ONCE A WEEK. 03/21/21 03/21/22  Vevelyn Francois, NP  DULoxetine (CYMBALTA) 30 MG capsule Take 1 capsule (30 mg total) by mouth daily. 30 mg daily x 1 week; then increase to 60 mg daily- for nerve and back pain 09/07/21   Lovorn, Jinny Blossom, MD  ENTRESTO 97-103 MG Take 1 tablet by mouth 2 (two) times daily. 06/15/21   Vevelyn Francois, NP  fluticasone (FLONASE) 50 MCG/ACT nasal spray Place into the nose. 03/21/21   [provider]  furosemide (LASIX) 40 MG tablet Take 1 tablet (40 mg total) by mouth 2 (two) times daily. 03/21/21   Vevelyn Francois, NP  gabapentin (NEURONTIN) 300 MG capsule Take 1 capsule (300 mg total) by mouth 3 (three) times daily. 03/21/21   Vevelyn Francois, NP  glipiZIDE (GLUCOTROL) 10 MG tablet Take 1 tablet (10 mg total) by mouth 2 (two) times daily before a meal. 03/21/21   Vevelyn Francois, NP  glucose monitoring kit (FREESTYLE) monitoring kit 1 each by Does not apply route as needed for other.  09/16/19   Azzie Glatter, FNP  HUMALOG 100 UNIT/ML injection Inject into the skin. 04/12/21   [provider]  insulin aspart (NOVOLOG) 100 UNIT/ML injection Inject 20 Units into the skin 3 (three) times daily with meals. 03/17/21 03/17/22  Vevelyn Francois, NP  insulin glargine (LANTUS) 100 UNIT/ML injection Inject 0.6 mLs (60 Units total) into the skin daily. 06/27/21   Vevelyn Francois, NP  ipratropium-albuterol (DUONEB) 0.5-2.5 (3) MG/3ML SOLN Take 3 mLs by nebulization every 6 (six) hours as needed (shortness of breath, wheeze). 10/20/20 11/14/21  Azzie Glatter, FNP  losartan-hydrochlorothiazide (HYZAAR) 100-25 MG tablet losartan 100 mg-hydrochlorothiazide 25 mg tablet     [provider]  metFORMIN (GLUCOPHAGE) 1000 MG tablet Take 1 tablet (1,000 mg total) by mouth 2 (two) times daily with a meal. 03/21/21   Vevelyn Francois, NP  methocarbamol (ROBAXIN) 750 MG tablet Take 1 tablet (750 mg total) by mouth 3 (three) times daily as needed for muscle spasms. 10/20/20   Azzie Glatter, FNP  montelukast (SINGULAIR) 10 MG tablet Take 1 tablet (10 mg total) by mouth at bedtime. 10/20/20   Azzie Glatter, FNP  naproxen (NAPROSYN) 500 MG tablet naproxen 500 mg tablet  TK 1 T PO BID WF    [provider]  ondansetron (ZOFRAN) 4 MG tablet Take 1 tablet (4 mg total) by mouth every 8 (eight) hours as needed for nausea or vomiting. 09/07/21   Lovorn, Jinny Blossom, MD  pantoprazole (PROTONIX) 40 MG tablet Take 1 tablet (40 mg total) by mouth daily. 10/20/20   Azzie Glatter, FNP  spironolactone (ALDACTONE) 25 MG tablet TAKE 1 TABLET (25 MG TOTAL) BY MOUTH DAILY. 05/25/20 05/25/21  Azzie Glatter, FNP  Tiotropium Bromide-Olodaterol (STIOLTO RESPIMAT) 2.5-2.5 MCG/ACT AERS Inhale 2 puffs into the lungs daily. 10/20/20   Azzie Glatter, FNP  valACYclovir (VALTREX) 1000 MG tablet valacyclovir 1 gram tablet    [provider]  Vitamin D, Ergocalciferol, (DRISDOL) 1.25 MG (50000 UNIT) CAPS capsule TAKE 1 CAPSULE (50,000 UNITS TOTAL) BY MOUTH EVERY 7 (SEVEN) DAYS. 10/10/20 10/10/21  Azzie Glatter, FNP      Allergies    Aspirin    Review of Systems   Review of Systems  Gastrointestinal:  Positive for abdominal pain, nausea and vomiting.  All other systems reviewed and are negative.  Physical Exam Updated Vital Signs BP (!) 166/75 (BP Location: Right Arm)    Pulse 98    Temp 97.8 F (36.6 C) (Oral)    Resp 20    Ht 5' 7"  (1.702 m)    Wt 136.1 kg    SpO2 98%    BMI 46.99 kg/m   Physical Exam Vitals and nursing note reviewed.  Constitutional:      Appearance: She is well-developed.     Comments: Vomiting in triage  HENT:     Head: Normocephalic and  atraumatic.  Eyes:     Conjunctiva/sclera: Conjunctivae normal.     Pupils: Pupils are equal, round, and reactive to light.  Cardiovascular:     Rate and Rhythm: Normal rate and regular rhythm.     Heart sounds: Normal heart sounds.  Pulmonary:     Effort: Pulmonary effort is normal.     Breath sounds: Normal breath sounds.  Abdominal:     General: Bowel sounds are normal.     Palpations: Abdomen is soft.     Tenderness: There is abdominal tenderness in the periumbilical area.  Comments: Large umbilical hernia, overall soft, does not tolerate full reduction attempts  Musculoskeletal:        General: Normal range of motion.     Cervical back: Normal range of motion.  Skin:    General: Skin is warm and dry.  Neurological:     Mental Status: She is alert and oriented to person, place, and time.    ED Results / Procedures / Treatments   Labs (all labs ordered are listed, but only abnormal results are displayed) Labs Reviewed  CBC WITH DIFFERENTIAL/PLATELET - Abnormal; Notable for the following components:      Result Value   RBC 5.16 (*)    HCT 47.4 (*)    Neutro Abs 8.0 (*)    All other components within normal limits  COMPREHENSIVE METABOLIC PANEL - Abnormal; Notable for the following components:   Glucose, Bld 224 (*)    Total Protein 8.4 (*)    All other components within normal limits  URINALYSIS, ROUTINE W REFLEX MICROSCOPIC - Abnormal; Notable for the following components:   APPearance TURBID (*)    Specific Gravity, Urine 1.035 (*)    Glucose, UA 50 (*)    Protein, ur >=300 (*)    Bacteria, UA RARE (*)    All other components within normal limits  RESP PANEL BY RT-PCR (FLU A&B, COVID) ARPGX2  LIPASE, BLOOD    EKG None  Radiology CT ABDOMEN PELVIS W CONTRAST  Result Date: 09/30/2021 CLINICAL DATA:  Complicated umbilical hernia, concern for possible incarceration. EXAM: CT ABDOMEN AND PELVIS WITH CONTRAST TECHNIQUE: Multidetector CT imaging of the abdomen  and pelvis was performed using the standard protocol following bolus administration of intravenous contrast. CONTRAST:  42m OMNIPAQUE IOHEXOL 350 MG/ML SOLN COMPARISON:  05/04/2020. FINDINGS: Lower chest: The heart is enlarged and there is no pericardial effusion. Strandy atelectasis is noted at the lung bases. Hepatobiliary: The liver is enlarged and fatty infiltration is noted. No biliary ductal dilatation. The gallbladder is without stones. Pancreas: Unremarkable. No pancreatic ductal dilatation or surrounding inflammatory changes. Spleen: Normal in size without focal abnormality. Adrenals/Urinary Tract: Adrenal glands are unremarkable. Kidneys are normal, without renal calculi, focal lesion, or hydronephrosis. The bladder is decompressed. Stomach/Bowel: The stomach is within normal limits. Multiple dilated loops of small bowel are seen in the abdomen measuring up to 3.4 cm in diameter to the level of a large umbilical hernia. The bowel in the hernia sac is normal in caliber. Fat stranding and ascites are noted in the hernia sac. The bowel distal to the hernia sac is normal in caliber. No free air or pneumatosis is seen. Multiple scattered diverticula are present along the colon without evidence of diverticulitis. A normal appendix is seen in the right lower quadrant. Vascular/Lymphatic: Aortic atherosclerosis. No enlarged abdominal or pelvic lymph nodes. Reproductive: The uterus is enlarged for patient the stated age. No adnexal mass is seen. Other: Small amount fluid is noted in the umbilical hernia. Musculoskeletal: Degenerative changes in the thoracolumbar spine. No acute osseous abnormality. IMPRESSION: 1. Umbilical hernia containing loops of small-bowel with surrounding fat stranding and a small amount of free fluid. There is dilatation of the small bowel loops proximal to the hernia sac, compatible with small-bowel obstruction. Surgical consultation is recommended. 2. Hepatomegaly with steatosis. 3.  Cardiomegaly. 4. Aortic atherosclerosis. Critical findings were reported to Dr. MFlorina Ouat 3:45 a.m. Electronically Signed   By: LBrett FairyM.D.   On: 09/30/2021 03:48    Procedures Procedures  Medications Ordered in ED Medications  morphine 4 MG/ML injection 4 mg (has no administration in time range)  ondansetron (ZOFRAN) injection 4 mg (has no administration in time range)  sodium chloride (PF) 0.9 % injection (  Given by Other 09/30/21 0403)  iohexol (OMNIPAQUE) 350 MG/ML injection 80 mL (80 mLs Intravenous Contrast Given 09/30/21 0322)    ED Course/ Medical Decision Making/ A&P                           Medical Decision Making Amount and/or Complexity of Data Reviewed Labs: ordered. Radiology: ordered and independent interpretation performed. ECG/medicine tests: ordered and independent interpretation performed.   61 y.o. F here with abdominal pain.  Known umbilical hernia x 3 years, no issues until the past few days.  She appears uncomfortable but is non-toxic.  Large umbilical hernia on exam, partially reducible but does not tolerate fully.  No overlying skin changes.  Began vomiting in triage.  Concern for possible incarceration/strangulation vs SBO or similar.  Labs and CT scan ordered--- notable for normal WBC, normal renal function.  CT reviewed, does have findings of SBO with several loops of bowel within hernia sac.  No signs of incarceration currently.  Patient has been updated, covid screen sent.  NG placed.  Will discuss with general surgery.  Discussed with Dr. Donne Hazel-- will consult.  Requested medical admission given her complex medical history.  Hospitalist, Dr. Marlowe Sax will admit for ongoing care.  Final Clinical Impression(s) / ED Diagnoses Final diagnoses:  SBO (small bowel obstruction) [facility])    Rx / DC Orders ED Discharge Orders     None         Larene Pickett, PA-C 09/30/21 0620    Shanon Rosser, MD 09/30/21 [phone]

## 2021-09-30 NOTE — ED Notes (Addendum)
Procedure for NG tube placement explained to pt as well as benefits/necessity of NG tube.  Per pt she cannot handle that procedure.  Will inform Dr. Michaelle Birks.

## 2021-09-30 NOTE — Anesthesia Postprocedure Evaluation (Signed)
Anesthesia Post Note  Patient: [name]  Procedure(s) Performed: INCARCERATED HERNIA REPAIR UMBILICAL ADULT (Abdomen)     Patient location during evaluation: PACU Anesthesia Type: General Level of consciousness: awake Pain management: pain level controlled Vital Signs Assessment: post-procedure vital signs reviewed and stable Respiratory status: spontaneous breathing and respiratory function stable Cardiovascular status: stable Postop Assessment: no apparent nausea or vomiting Anesthetic complications: no   No notable events documented.  Last Vitals:  Vitals:   09/30/21 1545 09/30/21 1612  BP: (!) 162/89 (!) 159/83  Pulse: 92 92  Resp: 11 18  Temp: 37.1 C 37.4 C  SpO2: 94% 96%    Last Pain:  Vitals:   09/30/21 1612  TempSrc: Oral  PainSc:                  Merlinda Frederick

## 2021-09-30 NOTE — Transfer of Care (Signed)
Immediate Anesthesia Transfer of Care Note  Patient: Sharon Swanson  Procedure(s) Performed: INCARCERATED HERNIA REPAIR UMBILICAL ADULT (Abdomen)  Patient Location: PACU  Anesthesia Type:General  Level of Consciousness: awake and patient cooperative  Airway & Oxygen Therapy: Patient Spontanous Breathing and Patient connected to face mask  Post-op Assessment: Report given to RN and Post -op Vital signs reviewed and stable  Post vital signs: Reviewed and stable  Last Vitals:  Vitals Value Taken Time  BP    Temp    Pulse    Resp    SpO2      Last Pain:  Vitals:   09/30/21 1051  TempSrc: Oral  PainSc:       Patients Stated Pain Goal: 5 (53/01/04 0459)  Complications: No notable events documented.

## 2021-09-30 NOTE — Consult Note (Signed)
Sharon Swanson [birth year]  [id number].    Requesting MD: Quincy Carnes, PA-C Chief Complaint/Reason for Consult: incarcerated umbilical hernia  HPI:  Sharon Swanson is a 61 yo female with a history of COPD (on home O2) and CAD who has had an umbilical hernia repair for several years. It has been reducible and she was evaluated in clinical by multiple surgeons including myself but elective repair was deferred due to her significant medical comorbidities and high risk of recurrence. At that time the hernia was symptomatic but reducible.  For the last few days she has had increasing pain at the hernia and has developed nausea and vomiting. She presented to the ED. A CT scan showed dilated small bowel proximal to the hernia, consistent with a small bowel obstruction. General surgery was consulted.   The patient has not had any prior abdominal surgeries. She has severe COPD and is on oxygen at home, and smokes 2 cigarettes daily.  ROS: Review of Systems  Constitutional:  Negative for chills and fever.  HENT:  Negative for sore throat.   Respiratory:  Negative for shortness of breath, wheezing and stridor.   Cardiovascular:  Negative for chest pain.  Gastrointestinal:  Positive for abdominal pain, nausea and vomiting.  Neurological:  Negative for loss of consciousness and weakness.   Family History  Problem Relation Age of Onset   Hypertension Mother    Diabetes Other    Diabetes Maternal Grandmother    Hypertension Maternal Grandmother     Past Medical History:  Diagnosis Date   Abnormal Pap smear of cervix 10/2020   Acid reflux    Asthma    Atypical squamous cells of undetermined significance (ASC-US) on cervical Pap smear 10/2020   Bilateral lower extremity edema 01/2020   COPD (chronic obstructive pulmonary disease) (HCC)    CPAP (continuous positive airway pressure) dependence    Diabetes ([place])    Diabetes mellitus without complication ([place])    Type II   Fatty liver 11/08/2011    Fibroids 11/08/2011   Hyperglycemia    Hyperlipidemia 05/2020   Hypertension    Microalbuminuria 01/2020   Obesity (BMI 30-39.9) 11/08/2011   Osteoarthritis of right acromioclavicular joint    Pancreatitis 11/08/2011   Rotator cuff tear, right    Shortness of breath on exertion 01/2020   Sleep apnea    Ventral hernia    Vitamin D deficiency 05/2020    Past Surgical History:  Procedure Laterality Date   ORTHOPEDIC SURGERY     R ankle, tendonitis    Social History:  reports that she has been smoking cigarettes. She started smoking about 41 years ago. She has a 3.90 pack-year smoking history. She has never used smokeless tobacco. She reports that she does not drink alcohol and does not use drugs.  Allergies:  Allergies  Allergen Reactions   Aspirin Other (See Comments)    Abdominal pain Abdominal pain Abdominal pain Abdominal pain Abdominal pain Abdominal pain Abdominal pain    (Not in a hospital admission)    Physical Exam: Blood pressure (!) 170/98, pulse 85, temperature 97.8 F (36.6 C), temperature source Oral, resp. rate 17, height 5\' 7"  (1.702 m), weight 136.1 kg, SpO2 97 %. General: resting comfortably, appears stated age, no apparent distress Neurological: alert and oriented, no focal deficits, cranial nerves grossly in tact HEENT: normocephalic, atraumatic, no scleral icterus CV: regular rate and rhythm, extremities warm and well-perfused Respiratory: normal work of breathing on nasal cannula, symmetric chest wall expansion Abdomen: Supraumbilical  hernia is tender to palpation and soft but nonreducible, but no overlying skin changes. Abdomen is otherwise nontender. Extremities: warm and well-perfused, no deformities, moving all extremities spontaneously Psychiatric: normal mood and affect Skin: warm and dry, no jaundice, no rashes or lesions   Results for orders placed or performed during the hospital encounter of 09/30/21 (from the past 48 hour(s))  CBC  with Differential     Status: Abnormal   Collection Time: 09/30/21  1:52 AM  Result Value Ref Range   WBC 9.6 4.0 - 10.5 K/uL   RBC 5.16 (H) 3.87 - 5.11 MIL/uL   Hemoglobin 14.9 12.0 - 15.0 g/dL   HCT 47.4 (H) 36.0 - 46.0 %   MCV 91.9 80.0 - 100.0 fL   MCH 28.9 26.0 - 34.0 pg   MCHC 31.4 30.0 - 36.0 g/dL   RDW 13.8 11.5 - 15.5 %   Platelets 268 150 - 400 K/uL   nRBC 0.0 0.0 - 0.2 %   Neutrophils Relative % 83 %   Neutro Abs 8.0 (H) 1.7 - 7.7 K/uL   Lymphocytes Relative 11 %   Lymphs Abs 1.0 0.7 - 4.0 K/uL   Monocytes Relative 5 %   Monocytes Absolute 0.5 0.1 - 1.0 K/uL   Eosinophils Relative 1 %   Eosinophils Absolute 0.1 0.0 - 0.5 K/uL   Basophils Relative 0 %   Basophils Absolute 0.0 0.0 - 0.1 K/uL   Immature Granulocytes 0 %   Abs Immature Granulocytes 0.02 0.00 - 0.07 K/uL    Comment: Performed at [facility], [place] [address], [place], [place] [address]  Comprehensive metabolic panel     Status: Abnormal   Collection Time: 09/30/21  1:52 AM  Result Value Ref Range   Sodium 135 135 - 145 mmol/L   Potassium 4.1 3.5 - 5.1 mmol/L   Chloride 98 98 - 111 mmol/L   CO2 25 22 - 32 mmol/L   Glucose, Bld 224 (H) 70 - 99 mg/dL    Comment: Glucose reference range applies only to samples taken after fasting for at least 8 hours.   BUN 16 6 - 20 mg/dL   Creatinine, Ser 0.73 0.44 - 1.00 mg/dL   Calcium 10.1 8.9 - 10.3 mg/dL   Total Protein 8.4 (H) 6.5 - 8.1 g/dL   Albumin 4.6 3.5 - 5.0 g/dL   AST 27 15 - 41 U/L   ALT 34 0 - 44 U/L   Alkaline Phosphatase 79 38 - 126 U/L   Total Bilirubin 0.7 0.3 - 1.2 mg/dL   GFR, Estimated >60 >60 mL/min    Comment: (NOTE) Calculated using the CKD-EPI Creatinine Equation (2021)    Anion gap 12 5 - 15    Comment: Performed at [facility], [place] [address] [place], [place] [address]  Lipase, blood     Status: None   Collection Time: 09/30/21  1:52 AM  Result Value Ref Range   Lipase 46 11 - 51 U/L     Comment: Performed at [facility], [place] [address], [place], [place] [address]  Urinalysis, Routine w reflex microscopic Urine, Clean Catch     Status: Abnormal   Collection Time: 09/30/21  3:00 AM  Result Value Ref Range   Color, Urine YELLOW YELLOW   APPearance TURBID (A) CLEAR   Specific Gravity, Urine 1.035 (H) 1.005 - 1.030   pH 5.0 5.0 - 8.0   Glucose, UA 50 (A) NEGATIVE mg/dL   Hgb  urine dipstick NEGATIVE NEGATIVE   Bilirubin Urine NEGATIVE NEGATIVE   Ketones, ur NEGATIVE NEGATIVE mg/dL   Protein, ur >=300 (A) NEGATIVE mg/dL   Nitrite NEGATIVE NEGATIVE   Leukocytes,Ua NEGATIVE NEGATIVE   RBC / HPF 6-10 0 - 5 RBC/hpf   WBC, UA 0-5 0 - 5 WBC/hpf   Bacteria, UA RARE (A) NONE SEEN   Squamous Epithelial / LPF 11-20 0 - 5   Mucus PRESENT    Hyaline Casts, UA PRESENT     Comment: Performed at [facility], [place] [address] [place], [place] [address]  Resp Panel by RT-PCR (Flu A&B, Covid) Nasopharyngeal Swab     Status: None   Collection Time: 09/30/21  4:08 AM   Specimen: Nasopharyngeal Swab; Nasopharyngeal(NP) swabs in vial transport medium  Result Value Ref Range   SARS Coronavirus 2 by RT PCR NEGATIVE NEGATIVE    Comment: (NOTE) SARS-CoV-2 target nucleic acids are NOT DETECTED.  The SARS-CoV-2 RNA is generally detectable in upper respiratory specimens during the acute phase of infection. The lowest concentration of SARS-CoV-2 viral copies this assay can detect is 138 copies/mL. A negative result does not preclude SARS-Cov-2 infection and should not be used as the sole basis for treatment or other patient management decisions. A negative result may occur with  improper specimen collection/handling, submission of specimen other than nasopharyngeal swab, presence of viral mutation(s) within the areas targeted by this assay, and inadequate number of viral copies(<138 copies/mL). A negative result must be combined with clinical observations,  patient history, and epidemiological information. The expected result is Negative.  Fact Sheet for Patients:  EntrepreneurPulse.com.au  Fact Sheet for Healthcare Providers:  IncredibleEmployment.be  This test is no t yet approved or cleared by the [place] FDA and  has been authorized for detection and/or diagnosis of SARS-CoV-2 by FDA under an Emergency Use Authorization (EUA). This EUA will remain  in effect (meaning this test can be used) for the duration of the COVID-19 declaration under Section 564(b)(1) of the Act, 21 U.S.C.section 360bbb-3(b)(1), unless the authorization is terminated  or revoked sooner.       Influenza A by PCR NEGATIVE NEGATIVE   Influenza B by PCR NEGATIVE NEGATIVE    Comment: (NOTE) The Xpert Xpress SARS-CoV-2/FLU/RSV plus assay is intended as an aid in the diagnosis of influenza from Nasopharyngeal swab specimens and should not be used as a sole basis for treatment. Nasal washings and aspirates are unacceptable for Xpert Xpress SARS-CoV-2/FLU/RSV testing.  Fact Sheet for Patients: EntrepreneurPulse.com.au  Fact Sheet for Healthcare Providers: IncredibleEmployment.be  This test is not yet approved or cleared by the [place] FDA and has been authorized for detection and/or diagnosis of SARS-CoV-2 by FDA under an Emergency Use Authorization (EUA). This EUA will remain in effect (meaning this test can be used) for the duration of the COVID-19 declaration under Section 564(b)(1) of the Act, 21 U.S.C. section 360bbb-3(b)(1), unless the authorization is terminated or revoked.  Performed at [facility], [place] [address], [place], [place] [address]    CT ABDOMEN PELVIS W CONTRAST  Result Date: 09/30/2021 CLINICAL DATA:  Complicated umbilical hernia, concern for possible incarceration. EXAM: CT ABDOMEN AND PELVIS WITH CONTRAST TECHNIQUE: Multidetector CT  imaging of the abdomen and pelvis was performed using the standard protocol following bolus administration of intravenous contrast. CONTRAST:  18mL OMNIPAQUE IOHEXOL 350 MG/ML SOLN COMPARISON:  05/04/2020. FINDINGS: Lower chest: The heart is enlarged and there is no pericardial effusion. Strandy atelectasis is  noted at the lung bases. Hepatobiliary: The liver is enlarged and fatty infiltration is noted. No biliary ductal dilatation. The gallbladder is without stones. Pancreas: Unremarkable. No pancreatic ductal dilatation or surrounding inflammatory changes. Spleen: Normal in size without focal abnormality. Adrenals/Urinary Tract: Adrenal glands are unremarkable. Kidneys are normal, without renal calculi, focal lesion, or hydronephrosis. The bladder is decompressed. Stomach/Bowel: The stomach is within normal limits. Multiple dilated loops of small bowel are seen in the abdomen measuring up to 3.4 cm in diameter to the level of a large umbilical hernia. The bowel in the hernia sac is normal in caliber. Fat stranding and ascites are noted in the hernia sac. The bowel distal to the hernia sac is normal in caliber. No free air or pneumatosis is seen. Multiple scattered diverticula are present along the colon without evidence of diverticulitis. A normal appendix is seen in the right lower quadrant. Vascular/Lymphatic: Aortic atherosclerosis. No enlarged abdominal or pelvic lymph nodes. Reproductive: The uterus is enlarged for patient the stated age. No adnexal mass is seen. Other: Small amount fluid is noted in the umbilical hernia. Musculoskeletal: Degenerative changes in the thoracolumbar spine. No acute osseous abnormality. IMPRESSION: 1. Umbilical hernia containing loops of small-bowel with surrounding fat stranding and a small amount of free fluid. There is dilatation of the small bowel loops proximal to the hernia sac, compatible with small-bowel obstruction. Surgical consultation is recommended. 2. Hepatomegaly  with steatosis. 3. Cardiomegaly. 4. Aortic atherosclerosis. Critical findings were reported to Dr. Florina Ou at 3:45 a.m. Electronically Signed   By: Brett Fairy M.D.   On: 09/30/2021 03:48      Assessment/Plan 61 yo female presenting with a small bowel obstruction secondary to an incarcerated umbilical hernia. I reviewed her CT, which  shows dilated loops of proximal small bowel, with a 4cm umbilical hernia containing small bowel. There is surrounding fluid but no overt signs of ischemia. Recommended proceeding with urgent surgical repair. I discussed the details of repair with the patient including the possibility of mesh placement. She is at high risk of recurrence, and increased risk of mesh infection given smoking. I encouraged her to stop smoking. - NPO. Recommend NG tube decompression however patient has refused NG placement. - Pain and nausea control - Proceed to OR today for surgery.   Michaelle Birks, MD [facility] General, Hepatobiliary and Pancreatic Surgery 09/30/21 8:34 AM

## 2021-09-30 NOTE — Anesthesia Preprocedure Evaluation (Addendum)
Anesthesia Evaluation  Patient identified by MRN, date of birth, ID band Patient awake    Reviewed: Allergy & Precautions, NPO status , Patient's Chart, lab work & pertinent test results  Airway Mallampati: III  TM Distance: >3 FB Neck ROM: Full    Dental no notable dental hx.    Pulmonary asthma , sleep apnea and Continuous Positive Airway Pressure Ventilation , COPD (3L O2 all the time),  oxygen dependent, Current Smoker and Patient abstained from smoking.,     + decreased breath sounds+ wheezing      Cardiovascular hypertension,  Rhythm:Regular Rate:Tachycardia     Neuro/Psych Anxiety  Neuromuscular disease    GI/Hepatic Neg liver ROS, GERD  ,  Endo/Other  diabetes, Type 2Morbid obesityhyperlipidemia  Renal/GU negative Renal ROS  negative genitourinary   Musculoskeletal  (+) Arthritis , Osteoarthritis,    Abdominal (+) + obese,   Peds negative pediatric ROS (+)  Hematology negative hematology ROS (+)   Anesthesia Other Findings   Reproductive/Obstetrics negative OB ROS                           Anesthesia Physical Anesthesia Plan  ASA: 3  Anesthesia Plan: General   Post-op Pain Management: Minimal or no pain anticipated   Induction: Intravenous, Rapid sequence and Cricoid pressure planned  PONV Risk Score and Plan: 2 and Treatment may vary due to age or medical condition, Ondansetron, Midazolam, Dexamethasone and Scopolamine patch - Pre-op  Airway Management Planned: Oral ETT  Additional Equipment: None  Intra-op Plan:   Post-operative Plan: Extubation in OR  Informed Consent: I have reviewed the patients History and Physical, chart, labs and discussed the procedure including the risks, benefits and alternatives for the proposed anesthesia with the patient or authorized representative who has indicated his/her understanding and acceptance.     Dental advisory given  Plan  Discussed with: Anesthesiologist and CRNA  Anesthesia Plan Comments:       Anesthesia Quick Evaluation

## 2021-09-30 NOTE — Anesthesia Procedure Notes (Signed)
Procedure Name: Intubation Date/Time: 09/30/2021 11:53 AM Performed by: Claudia Desanctis, CRNA Pre-anesthesia Checklist: Patient identified, Emergency Drugs available, Suction available and Patient being monitored Patient Re-evaluated:Patient Re-evaluated prior to induction Oxygen Delivery Method: Circle system utilized Preoxygenation: Pre-oxygenation with 100% oxygen Induction Type: IV induction, Rapid sequence and Cricoid Pressure applied Laryngoscope Size: 2 and Miller Grade View: Grade I Tube type: Oral Tube size: 7.5 mm Number of attempts: 1 Airway Equipment and Method: Stylet Placement Confirmation: ETT inserted through vocal cords under direct vision, positive ETCO2 and breath sounds checked- equal and bilateral Secured at: 21 cm Tube secured with: Tape Dental Injury: Teeth and Oropharynx as per pre-operative assessment

## 2021-09-30 NOTE — Op Note (Signed)
Date: 09/30/21  Patient: Sharon Swanson MRN: [id number]  Preoperative Diagnosis: Small bowel obstruction secondary to incarcerated umbilical hernia Postoperative Diagnosis: Same  Procedure: Open umbilical hernia repair with mesh underly  Surgeon: Michaelle Birks, MD Assistant: Obie Dredge, PA-C  EBL: Minimal  Anesthesia: General endotracheal  Specimens: None  Indications: Sharon Swanson is a 61 yo female with significant medical comorbidities who has had an umbilical hernia present for many years. She presented to the ED with worsening abdominal pain, nausea and vomiting. CT scan showed small intestine within the hernia with an associated small bowel obstruction. Urgent operative repair was recommended and she was brought to the OR.  Findings: Supraumbilical hernia containing omentum. Small bowel had spontaneously reduced on reduction of anesthesia, however the small bowel was examined and was viable.  An 8 cm Ventralex mesh underlay was placed.  Procedure details: Informed consent was obtained in the preoperative area prior to the procedure. The patient was brought to the operating room and placed on the table in the supine position. General anesthesia was induced and appropriate lines and drains were placed for intraoperative monitoring. Perioperative antibiotics were administered per SCIP guidelines. The abdomen was prepped and draped in the usual sterile fashion. A pre-procedure timeout was taken verifying patient identity, surgical site and procedure to be performed.  A vertical midline incision was made just superior to the umbilicus overlying the hernia.  The subcutaneous tissue was sharply divided to expose the hernia sac.  The hernia sac was dissected out from the surrounding subcutaneous tissue using blunt dissection and cautery.  Once the sac had been exposed down to the fascia, the sac was opened.  It contained viable omentum but no small bowel, which presumably spontaneously  reduced after induction of general anesthesia.  The sac was dissected off the fascia.  Part of the omentum was adherent to the sac.  The omentum was dissected off the sac, tying off the vessels with 2-0 silk ties.  The sac was then discarded and the omentum was reduced back into the abdomen.  Part of the small bowel just deep to the fascial defect was eviscerated.  It was clear that this loop of bowel had been herniated because it was mildly thickened with chronic thick adhesions in the mesentery.  These adhesions were sharply lysed.  The bowel was closely examined and was pink and healthy in appearance with no signs of ischemia.  The bowel was found distally and was very close to the terminal ileum.  The segment of bowel proximal to this loop was also examined and was normal in appearance.  The bowel was then reduced back into the abdomen.  The fascial defect was measured at approximately 4.5 cm in diameter.  An 8cm piece of round Ventralex mesh was brought onto the field.  It was placed as an underlay and secured to the fascia with 1 Novafil sutures at 8 points.  The edges of the mesh were palpated to ensure that there was no bowel between the mesh and the abdominal wall, and the sutures were then tied down and the mesh lay flat against the abdominal wall.  The fascia was then closed over the mesh using interrupted 1 Novafil figure-of-eight sutures.  The wound was irrigated and hemostasis was achieved with cautery.  The fascia and skin were infiltrated with 0.25% bupivacaine with epinephrine.  Scarpa's fascia was closed with a running 3-0 Vicryl suture.  The deep dermal layer was closed with a running 3-0 Vicryl suture, and the skin  was closed with a running subcuticular 4-0 Monocryl suture.  Sterile dressing was applied.  The patient tolerated the procedure well with no apparent complications.  All counts were correct x2 at the end of the procedure. The patient was extubated and taken to PACU in stable  condition.  Michaelle Birks, MD 09/30/21 1:45 PM

## 2021-09-30 NOTE — H&P (Signed)
History and Physical    Sharon Swanson DEY:[id number] DOB: 21-Aug-1961 DOA: 09/30/2021  PCP: Vevelyn Francois, NP  Patient coming from: Home  Chief Complaint: N/V, ab pain  HPI: Sharon Swanson is a 61 y.o. female with medical history significant of morbid obesity, DM2, HLD, HTN, COPD. Presenting with abdominal pain, N/V. Symptoms started two weeks ago. She has nearly constant global abdominal throbbing pain. She has noted some distention of her abdomen. She has had nausea and vomiting as recently as last night. She did not try any medications. Since her symptoms did not resolve last night, she decided to come to the ED for help. She denies any other aggravating or alleviating factors.   ED Course: Imaging showed SBO from incarcerated umbilical hernia. General surgery was consulted. TRH was called for admission.   Review of Systems:  Denies CP, dyspnea, palpations, diarrhea, fever, sick contacts. Review of systems is otherwise negative for all not mentioned in HPI.   PMHx Past Medical History:  Diagnosis Date   Abnormal Pap smear of cervix 10/2020   Acid reflux    Asthma    Atypical squamous cells of undetermined significance (ASC-US) on cervical Pap smear 10/2020   Bilateral lower extremity edema 01/2020   COPD (chronic obstructive pulmonary disease) (HCC)    CPAP (continuous positive airway pressure) dependence    Diabetes ([place])    Diabetes mellitus without complication ([place])    Type II   Fatty liver 11/08/2011   Fibroids 11/08/2011   Hyperglycemia    Hyperlipidemia 05/2020   Hypertension    Microalbuminuria 01/2020   Obesity (BMI 30-39.9) 11/08/2011   Osteoarthritis of right acromioclavicular joint    Pancreatitis 11/08/2011   Rotator cuff tear, right    Shortness of breath on exertion 01/2020   Sleep apnea    Ventral hernia    Vitamin D deficiency 05/2020    PSHx Past Surgical History:  Procedure Laterality Date   ORTHOPEDIC SURGERY     R ankle, tendonitis    SocHx   reports that she has been smoking cigarettes. She started smoking about 41 years ago. She has a 3.90 pack-year smoking history. She has never used smokeless tobacco. She reports that she does not drink alcohol and does not use drugs.  Allergies  Allergen Reactions   Aspirin Other (See Comments)    _0  Abdominal pain Abdominal pain    FamHx Family History  Problem Relation Age of Onset   Hypertension Mother    Diabetes Other    Diabetes Maternal Grandmother    Hypertension Maternal Grandmother     Prior to Admission medications   Medication Sig Start Date End Date Taking? Authorizing Provider  albuterol (VENTOLIN HFA) 108 (90 Base) MCG/ACT inhaler USE 2 INHALATIONS BY MOUTH  INTO THE LUNGS EVERY 4  HOURS AS NEEDED FOR  WHEEZING OR SHORTNESS OF  BREATH 12/27/20   Vevelyn Francois, NP  atorvastatin (LIPITOR) 20 MG tablet Take 1 tablet (20 mg total) by mouth daily. 03/21/21   Vevelyn Francois, NP  carvedilol (COREG) 12.5 MG tablet Take 1 tablet (12.5 mg total) by mouth 2 (two) times daily. 02/02/21   Imogene Burn, PA-C  carvedilol (COREG) 6.25 MG tablet Take 6.25 mg by mouth 2 (two) times daily. 04/12/21   [provider]  cetirizine (ZYRTEC) 10 MG tablet Take 1 tablet (10 mg total) by mouth in the morning. 03/21/21   Vevelyn Francois, NP  Continuous Blood Gluc Receiver (FREESTYLE LIBRE 2 READER) DEVI Use as directed daily. 03/17/21   Vevelyn Francois, NP  Continuous Blood Gluc Sensor (FREESTYLE LIBRE 2 SENSOR) MISC 1 application by Does not apply route every 14 (fourteen) days. 03/17/21   Vevelyn Francois, NP  Continuous Blood Gluc Transmit (DEXCOM G6 TRANSMITTER) MISC Change every 10 days as needed/directed 03/18/21   Vevelyn Francois, NP  Continuous Blood Gluc Transmit (DEXCOM G6 TRANSMITTER) MISC Use as directed Patient not taking: Reported on 06/08/2021 03/18/21   Vevelyn Francois, NP  docusate sodium (COLACE) 100  MG capsule Take 1 capsule (100 mg total) by mouth 3 (three) times daily as needed for mild constipation. 03/21/21   Vevelyn Francois, NP  Dulaglutide 0.75 MG/0.5ML SOPN INJECT 0.75 MG INTO THE SKIN ONCE A WEEK. 03/21/21 03/21/22  Vevelyn Francois, NP  DULoxetine (CYMBALTA) 30 MG capsule Take 1 capsule (30 mg total) by mouth daily. 30 mg daily x 1 week; then increase to 60 mg daily- for nerve and back pain 09/07/21   Lovorn, Jinny Blossom, MD  ENTRESTO 97-103 MG Take 1 tablet by mouth 2 (two) times daily. 06/15/21   Vevelyn Francois, NP  fluticasone (FLONASE) 50 MCG/ACT nasal spray Place into the nose. 03/21/21   [provider]  furosemide (LASIX) 40 MG tablet Take 1 tablet (40 mg total) by mouth 2 (two) times daily. 03/21/21   Vevelyn Francois, NP  gabapentin (NEURONTIN) 300 MG capsule Take 1 capsule (300 mg total) by mouth 3 (three) times daily. 03/21/21   Vevelyn Francois, NP  glipiZIDE (GLUCOTROL) 10 MG tablet Take 1 tablet (10 mg total) by mouth 2 (two) times daily before a meal. 03/21/21   Vevelyn Francois, NP  glucose monitoring kit (FREESTYLE) monitoring kit 1 each by Does not apply route as needed for other. 09/16/19   Azzie Glatter, FNP  HUMALOG 100 UNIT/ML injection Inject into the skin. 04/12/21   [provider]  insulin aspart (NOVOLOG) 100 UNIT/ML injection Inject 20 Units into the skin 3 (three) times daily with meals. 03/17/21 03/17/22  Vevelyn Francois, NP  insulin glargine (LANTUS) 100 UNIT/ML injection Inject 0.6 mLs (60 Units total) into the skin daily. 06/27/21   Vevelyn Francois, NP  ipratropium-albuterol (DUONEB) 0.5-2.5 (3) MG/3ML SOLN Take 3 mLs by nebulization every 6 (six) hours as needed (shortness of breath, wheeze). 10/20/20 11/14/21  Azzie Glatter, FNP  losartan-hydrochlorothiazide (HYZAAR) 100-25 MG tablet losartan 100 mg-hydrochlorothiazide 25 mg tablet    [provider]  metFORMIN (GLUCOPHAGE) 1000 MG tablet Take 1 tablet (1,000 mg total) by mouth 2 (two) times  daily with a meal. 03/21/21   Vevelyn Francois, NP  methocarbamol (ROBAXIN) 750 MG tablet Take 1 tablet (750 mg total) by mouth 3 (three) times daily as needed for muscle spasms. 10/20/20   Azzie Glatter, FNP  montelukast (SINGULAIR) 10 MG tablet Take 1 tablet (10 mg total) by mouth at bedtime. 10/20/20   Azzie Glatter, FNP  naproxen (NAPROSYN) 500 MG tablet naproxen 500 mg tablet  TK 1 T PO BID WF    [provider]  ondansetron (ZOFRAN) 4 MG tablet Take 1 tablet (4 mg total) by mouth every 8 (eight) hours as needed for nausea or vomiting. 09/07/21   Lovorn, Jinny Blossom, MD  pantoprazole (PROTONIX) 40 MG tablet Take 1 tablet (40 mg total) by mouth daily. 10/20/20   Azzie Glatter, FNP  spironolactone (ALDACTONE) 25 MG  tablet TAKE 1 TABLET (25 MG TOTAL) BY MOUTH DAILY. 05/25/20 05/25/21  Azzie Glatter, FNP  Tiotropium Bromide-Olodaterol (STIOLTO RESPIMAT) 2.5-2.5 MCG/ACT AERS Inhale 2 puffs into the lungs daily. 10/20/20   Azzie Glatter, FNP  valACYclovir (VALTREX) 1000 MG tablet valacyclovir 1 gram tablet    [provider]  Vitamin D, Ergocalciferol, (DRISDOL) 1.25 MG (50000 UNIT) CAPS capsule TAKE 1 CAPSULE (50,000 UNITS TOTAL) BY MOUTH EVERY 7 (SEVEN) DAYS. 10/10/20 10/10/21  Azzie Glatter, FNP    Physical Exam: Vitals:   09/30/21 0405 09/30/21 0430 09/30/21 0500 09/30/21 0530  BP: (!) 176/92 (!) 183/102 (!) 191/104 (!) 170/98  Pulse: 89 88 83 85  Resp: _0 Temp:      TempSrc:      SpO2: 95% 94% 94% 97%  Weight:      Height:        General: 61 y.o. female resting in bed in NAD Eyes: PERRL, normal sclera ENMT: Nares patent w/o discharge, orophaynx clear, dentition normal, ears w/o discharge/lesions/ulcers Neck: Supple, trachea midline Cardiovascular: RRR, +S1, S2, no m/g/r, equal pulses throughout Respiratory: CTABL, no w/r/r, normal WOB GI: BS hypoactive, distended but soft, globally TTP, hernia noted, no masses noted MSK: No c/c; BLE edema Neuro:  A&O x 3, no focal deficits Psyc: Appropriate interaction and affect, calm/cooperative  Labs on Admission: I have personally reviewed following labs and imaging studies  CBC: Recent Labs  Lab 09/30/21 0152  WBC 9.6  NEUTROABS 8.0*  HGB 14.9  HCT 47.4*  MCV 91.9  PLT 473   Basic Metabolic Panel: Recent Labs  Lab 09/30/21 0152  NA 135  K 4.1  CL 98  CO2 25  GLUCOSE 224*  BUN 16  CREATININE 0.73  CALCIUM 10.1   GFR: Estimated Creatinine Clearance: 107.9 mL/min (by C-G formula based on SCr of 0.73 mg/dL). Liver Function Tests: Recent Labs  Lab 09/30/21 0152  AST 27  ALT 34  ALKPHOS 79  BILITOT 0.7  PROT 8.4*  ALBUMIN 4.6   Recent Labs  Lab 09/30/21 0152  LIPASE 46   No results for input(s): AMMONIA in the last 168 hours. Coagulation Profile: No results for input(s): INR, PROTIME in the last 168 hours. Cardiac Enzymes: No results for input(s): CKTOTAL, CKMB, CKMBINDEX, TROPONINI in the last 168 hours. BNP (last 3 results) Recent Labs    02/02/21 0923  PROBNP 11   HbA1C: No results for input(s): HGBA1C in the last 72 hours. CBG: No results for input(s): GLUCAP in the last 168 hours. Lipid Profile: No results for input(s): CHOL, HDL, LDLCALC, TRIG, CHOLHDL, LDLDIRECT in the last 72 hours. Thyroid Function Tests: No results for input(s): TSH, T4TOTAL, FREET4, T3FREE, THYROIDAB in the last 72 hours. Anemia Panel: No results for input(s): VITAMINB12, FOLATE, FERRITIN, TIBC, IRON, RETICCTPCT in the last 72 hours. Urine analysis:    Component Value Date/Time   COLORURINE YELLOW 09/30/2021 0300   APPEARANCEUR TURBID (A) 09/30/2021 0300   LABSPEC 1.035 (H) 09/30/2021 0300   PHURINE 5.0 09/30/2021 0300   GLUCOSEU 50 (A) 09/30/2021 0300   GLUCOSEU NEGATIVE 08/12/2012 1516   HGBUR NEGATIVE 09/30/2021 0300   BILIRUBINUR NEGATIVE 09/30/2021 0300   BILIRUBINUR neg 10/04/2020 0847   KETONESUR NEGATIVE 09/30/2021 0300   PROTEINUR >=300 (A) 09/30/2021 0300    UROBILINOGEN 0.2 10/04/2020 0847   UROBILINOGEN 1.0 08/17/2014 1522   NITRITE NEGATIVE 09/30/2021 0300   LEUKOCYTESUR NEGATIVE 09/30/2021 0300    Radiological Exams on Admission:  CT ABDOMEN PELVIS W CONTRAST  Result Date: 09/30/2021 CLINICAL DATA:  Complicated umbilical hernia, concern for possible incarceration. EXAM: CT ABDOMEN AND PELVIS WITH CONTRAST TECHNIQUE: Multidetector CT imaging of the abdomen and pelvis was performed using the standard protocol following bolus administration of intravenous contrast. CONTRAST:  62m OMNIPAQUE IOHEXOL 350 MG/ML SOLN COMPARISON:  05/04/2020. FINDINGS: Lower chest: The heart is enlarged and there is no pericardial effusion. Strandy atelectasis is noted at the lung bases. Hepatobiliary: The liver is enlarged and fatty infiltration is noted. No biliary ductal dilatation. The gallbladder is without stones. Pancreas: Unremarkable. No pancreatic ductal dilatation or surrounding inflammatory changes. Spleen: Normal in size without focal abnormality. Adrenals/Urinary Tract: Adrenal glands are unremarkable. Kidneys are normal, without renal calculi, focal lesion, or hydronephrosis. The bladder is decompressed. Stomach/Bowel: The stomach is within normal limits. Multiple dilated loops of small bowel are seen in the abdomen measuring up to 3.4 cm in diameter to the level of a large umbilical hernia. The bowel in the hernia sac is normal in caliber. Fat stranding and ascites are noted in the hernia sac. The bowel distal to the hernia sac is normal in caliber. No free air or pneumatosis is seen. Multiple scattered diverticula are present along the colon without evidence of diverticulitis. A normal appendix is seen in the right lower quadrant. Vascular/Lymphatic: Aortic atherosclerosis. No enlarged abdominal or pelvic lymph nodes. Reproductive: The uterus is enlarged for patient the stated age. No adnexal mass is seen. Other: Small amount fluid is noted in the umbilical hernia.  Musculoskeletal: Degenerative changes in the thoracolumbar spine. No acute osseous abnormality. IMPRESSION: 1. Umbilical hernia containing loops of small-bowel with surrounding fat stranding and a small amount of free fluid. There is dilatation of the small bowel loops proximal to the hernia sac, compatible with small-bowel obstruction. Surgical consultation is recommended. 2. Hepatomegaly with steatosis. 3. Cardiomegaly. 4. Aortic atherosclerosis. Critical findings were reported to Dr. MFlorina Ouat 3:45 a.m. Electronically Signed   By: LBrett FairyM.D.   On: 09/30/2021 03:48    EKG: None obtained in ED  Assessment/Plan SBO secondary to incarcerated hernia     - admitted to inpt, med-surg     - keep NPO     - general surgery consulted, awaiting final recs     - fluids     - she has refused NGT placement at this time  HTN     - will resume home regimen when she comes off NPO status     - will have PRN hydralazine available  DM2     - NPO for now     - SSI, glucose checks, A1c  HLD     - resume home statin when she comes off NPO status  Morbid obesity     - encourage diet/lifestyle changes; follow up outpt  COPD      - resume home regimen  DVT prophylaxis: SCDs  Code Status: FULL  Family Communication: None at bedside  Consults called: EDP spoke w/ general surgery   Status is: Inpatient  Remains inpatient appropriate because: severity of illness  TJonnie FinnerDO [facility]  If 7PM-7AM, please contact night-coverage [email or web address]  09/30/2021, 7:19 AM

## 2021-10-01 DIAGNOSIS — K56609 Unspecified intestinal obstruction, unspecified as to partial versus complete obstruction: Secondary | ICD-10-CM | POA: Diagnosis not present

## 2021-10-01 LAB — CBC
HCT: 41.2 % (ref 36.0–46.0)
Hemoglobin: 12.9 g/dL (ref 12.0–15.0)
MCH: 29.2 pg (ref 26.0–34.0)
MCHC: 31.3 g/dL (ref 30.0–36.0)
MCV: 93.2 fL (ref 80.0–100.0)
Platelets: 243 K/uL (ref 150–400)
RBC: 4.42 MIL/uL (ref 3.87–5.11)
RDW: 14 % (ref 11.5–15.5)
WBC: 9.7 K/uL (ref 4.0–10.5)
nRBC: 0 % (ref 0.0–0.2)

## 2021-10-01 LAB — COMPREHENSIVE METABOLIC PANEL
ALT: 23 U/L (ref 0–44)
AST: 18 U/L (ref 15–41)
Albumin: 3.6 g/dL (ref 3.5–5.0)
Alkaline Phosphatase: 62 U/L (ref 38–126)
Anion gap: 10 (ref 5–15)
BUN: 11 mg/dL (ref 6–20)
CO2: 28 mmol/L (ref 22–32)
Calcium: 8.6 mg/dL — ABNORMAL LOW (ref 8.9–10.3)
Chloride: 101 mmol/L (ref 98–111)
Creatinine, Ser: 0.65 mg/dL (ref 0.44–1.00)
GFR, Estimated: >60 mL/min (ref >60)
Glucose, Bld: 145 mg/dL — ABNORMAL HIGH (ref 70–99)
Potassium: 4.2 mmol/L (ref 3.5–5.1)
Sodium: 139 mmol/L (ref 135–145)
Total Bilirubin: 0.5 mg/dL (ref 0.3–1.2)
Total Protein: 7 g/dL (ref 6.5–8.1)

## 2021-10-01 LAB — GLUCOSE, CAPILLARY
Glucose-Capillary: 125 mg/dL — ABNORMAL HIGH (ref 70–99)
Glucose-Capillary: 127 mg/dL — ABNORMAL HIGH (ref 70–99)
Glucose-Capillary: 135 mg/dL — ABNORMAL HIGH (ref 70–99)
Glucose-Capillary: 156 mg/dL — ABNORMAL HIGH (ref 70–99)

## 2021-10-01 LAB — HIV ANTIBODY (ROUTINE TESTING W REFLEX): HIV Screen 4th Generation wRfx: NONREACTIVE

## 2021-10-01 LAB — HEMOGLOBIN A1C
Hgb A1c MFr Bld: 7.1 % — ABNORMAL HIGH (ref 4.8–5.6)
Mean Plasma Glucose: 157.07 mg/dL

## 2021-10-01 MED ORDER — LABETALOL HCL 5 MG/ML IV SOLN
10.0000 mg | INTRAVENOUS | Status: DC | PRN
  Filled 2021-10-01: qty 4

## 2021-10-01 MED ORDER — PHENOL 1.4 % MT LIQD
1.0000 | OROMUCOSAL | Status: DC | PRN
  Administered 2021-10-06: 1 via OROMUCOSAL
  Filled 2021-10-01 (×2): qty 177

## 2021-10-01 MED ORDER — HYDRALAZINE HCL 20 MG/ML IJ SOLN
10.0000 mg | Freq: Three times a day (TID) | INTRAMUSCULAR | Status: DC | PRN
  Administered 2021-10-03: 10 mg via INTRAVENOUS
  Filled 2021-10-01: qty 1

## 2021-10-01 MED ORDER — UMECLIDINIUM BROMIDE 62.5 MCG/ACT IN AEPB
1.0000 | INHALATION_SPRAY | Freq: Every day | RESPIRATORY_TRACT | Status: DC
  Administered 2021-10-02 – 2021-10-06 (×5): 1 via RESPIRATORY_TRACT
  Filled 2021-10-01: qty 7

## 2021-10-01 MED ORDER — ARFORMOTEROL TARTRATE 15 MCG/2ML IN NEBU
15.0000 ug | INHALATION_SOLUTION | Freq: Two times a day (BID) | RESPIRATORY_TRACT | Status: DC
  Administered 2021-10-01 – 2021-10-06 (×10): 15 ug via RESPIRATORY_TRACT
  Filled 2021-10-01 (×12): qty 2

## 2021-10-01 NOTE — Evaluation (Signed)
Physical Therapy Evaluation Patient Details Name: Sharon Swanson MRN: [id number] DOB: 01/30/1961 Today's Date: 10/01/2021  History of Present Illness  61 year old female PMH: significant for type 2 diabetes mellitus, hyperlipidemia, essential hypertension, COPD on 3 L nasal cannula at baseline, chronic diastolic congestive heart failure, morbid obesity admitted with c/o abdominal pain associated with nausea/vomiting.   CT abdomen/pelvis notable for incarcerated abdominal umbilical hernia with small bowel obstruction.  General surgery was consulted and patient underwent open umbilical hernia repair with mesh by Dr. Zenia Resides on 09/30/2021.  Clinical Impression  Pt admitted with above diagnosis. Pt able to amb hallway distance with RW. Anticipate steady progress with mobility, doubt will need f/u post acute.    Pt currently with functional limitations due to the deficits listed below (see PT Problem List). Pt will benefit from skilled PT to increase their independence and safety with mobility to allow discharge to the venue listed below.          Recommendations for follow up therapy are one component of a multi-disciplinary discharge planning process, led by the attending physician.  Recommendations may be updated based on patient status, additional functional criteria and insurance authorization.  Follow Up Recommendations No PT follow up    Assistance Recommended at Discharge    Patient can return home with the following  Assistance with cooking/housework    Equipment Recommendations None recommended by PT  Recommendations for Other Services       Functional Status Assessment Patient has had a recent decline in their functional status and demonstrates the ability to make significant improvements in function in a reasonable and predictable amount of time.     Precautions / Restrictions Precautions Precautions: Other (comment) Precaution Comments: abd binder, NGT Restrictions Weight Bearing  Restrictions: No      Mobility  Bed Mobility Overal bed mobility: Needs Assistance Bed Mobility: Sit to Supine       Sit to supine: HOB elevated;Min assist   General bed mobility comments: assist to elevate LEs on to bed, incr  time    Transfers Overall transfer level: Needs assistance Equipment used: None Transfers: Sit to/from Stand Sit to Stand: Min guard           General transfer comment: cues to power up with LEs and control descent. incr time needed d/t pain    Ambulation/Gait Ambulation/Gait assistance: Min guard Gait Distance (Feet): 160 Feet Assistive device: Rolling walker (2 wheels) Gait Pattern/deviations: Step-through pattern Gait velocity: decr     General Gait Details: slow but overall steady gait with RW support. 3 rest breaks needed d/t pain; pt preferred to remove O2 for amb, SpO2 = 95% after amb  Stairs            Wheelchair Mobility    Modified Rankin (Stroke Patients Only)       Balance Overall balance assessment: No apparent balance deficits (not formally assessed)                                           Pertinent Vitals/Pain Pain Assessment: Faces Faces Pain Scale: Hurts even more Pain Location: abd Pain Descriptors / Indicators: Grimacing;Sore;Pressure Pain Intervention(s): Limited activity within patient's tolerance;Monitored during session;Premedicated before session;Repositioned    Home Living Family/patient expects to be discharged to:: Private residence Living Arrangements: Spouse/significant other Available Help at Discharge: Family Type of Home: Mobile home Home Access: Stairs to enter  Entrance Stairs-Rails: Right;Left;Can reach both Entrance Stairs-Number of Steps: 3   Home Layout: One level Home Equipment: Rollator (4 wheels)      Prior Function Prior Level of Function : Independent/Modified Independent             Mobility Comments: amb without device in home, uses rollator in  community; sleeps in recliner       Hand Dominance        Extremity/Trunk Assessment   Upper Extremity Assessment Upper Extremity Assessment: Overall WFL for tasks assessed    Lower Extremity Assessment Lower Extremity Assessment: Overall WFL for tasks assessed       Communication   Communication: No difficulties  Cognition Arousal/Alertness: Awake/alert Behavior During Therapy: WFL for tasks assessed/performed Overall Cognitive Status: Within Functional Limits for tasks assessed                                          General Comments      Exercises     Assessment/Plan    PT Assessment Patient needs continued PT services  PT Problem List Decreased strength;Decreased mobility;Decreased range of motion;Decreased activity tolerance;Decreased knowledge of use of DME       PT Treatment Interventions DME instruction;Therapeutic activities;Functional mobility training;Therapeutic exercise;Patient/family education;Gait training;Balance training    PT Goals (Current goals can be found in the Care Plan section)  Acute Rehab PT Goals Patient Stated Goal: get NGT out PT Goal Formulation: With patient Time For Goal Achievement: 10/15/21 Potential to Achieve Goals: Good    Frequency Min 3X/week     Co-evaluation               AM-PAC PT "6 Clicks" Mobility  Outcome Measure Help needed turning from your back to your side while in a flat bed without using bedrails?: A Little Help needed moving from lying on your back to sitting on the side of a flat bed without using bedrails?: A Little Help needed moving to and from a bed to a chair (including a wheelchair)?: A Little Help needed standing up from a chair using your arms (e.g., wheelchair or bedside chair)?: A Little Help needed to walk in hospital room?: A Little Help needed climbing 3-5 steps with a railing? : A Lot 6 Click Score: 17    End of Session Equipment Utilized During Treatment:  Gait belt Activity Tolerance: Patient tolerated treatment well Patient left: with call bell/phone within reach;with family/visitor present;in bed Nurse Communication: Mobility status PT Visit Diagnosis: Other abnormalities of gait and mobility (R26.89)    Time: 4580-9983 PT Time Calculation (min) (ACUTE ONLY): 24 min   Charges:     PT Treatments $Gait Training: 8-22 mins        Baxter Flattery, PT  Acute Rehab Dept ([place]) [phone] Pager [phone]  10/01/2021   [facility] 10/01/2021, 1:34 PM

## 2021-10-01 NOTE — Progress Notes (Signed)
Assessment & Plan: POD#1 - open umbilical hernia repair with mesh - Dr. Michaelle Birks  NPO, NG, IVF  Abd binder  OOB, ambulate in halls        Sharon Gemma, MD       [facility]       Office: [phone]   Chief Complaint: Incarcerated umbilical hernia with SBO  Subjective: Patient in bed, does not like NG tube.  Binder tight.  Had BM last night per patient.  Objective: Vital signs in last 24 hours: Temp:  [98.2 F (36.8 C)-99.3 F (37.4 C)] 98.8 F (37.1 C) (01/07 0441) Pulse Rate:  [89-103] 97 (01/07 0441) Resp:  [11-24] 18 (01/07 0441) BP: (152-182)/(75-95) 169/94 (01/07 0441) SpO2:  [85 %-98 %] 98 % (01/07 0441) Last BM Date: 09/30/21  Intake/Output from previous day: 01/06 0701 - 01/07 0700 In: 1105.3 [I.V.:1005.3; IV Piggyback:100] Out: 72 [Urine:1; Emesis/NG output:50; Stool:1; Blood:20] Intake/Output this shift: No intake/output data recorded.  Physical Exam: HEENT - sclerae clear, mucous membranes moist Neck - soft Abdomen - soft, obese; dressing dry and intact; mild tenderness; quiet Neuro - alert & oriented, no focal deficits  Lab Results:  Recent Labs    09/30/21 0152 10/01/21 0455  WBC 9.6 9.7  HGB 14.9 12.9  HCT 47.4* 41.2  PLT 268 243   BMET Recent Labs    09/30/21 0152 10/01/21 0455  NA 135 139  K 4.1 4.2  CL 98 101  CO2 25 28  GLUCOSE 224* 145*  BUN 16 11  CREATININE 0.73 0.65  CALCIUM 10.1 8.6*   PT/INR No results for input(s): LABPROT, INR in the last 72 hours. Comprehensive Metabolic Panel:    Component Value Date/Time   NA 139 10/01/2021 0455   NA 135 09/30/2021 0152   NA 142 02/02/2021 0923   NA 139 10/04/2020 1019   K 4.2 10/01/2021 0455   K 4.1 09/30/2021 0152   CL 101 10/01/2021 0455   CL 98 09/30/2021 0152   CO2 28 10/01/2021 0455   CO2 25 09/30/2021 0152   BUN 11 10/01/2021 0455   BUN 16 09/30/2021 0152   BUN 14 02/02/2021 0923   BUN 10 10/04/2020 1019   CREATININE 0.65 10/01/2021  0455   CREATININE 0.73 09/30/2021 0152   GLUCOSE 145 (H) 10/01/2021 0455   GLUCOSE 224 (H) 09/30/2021 0152   CALCIUM 8.6 (L) 10/01/2021 0455   CALCIUM 10.1 09/30/2021 0152   AST 18 10/01/2021 0455   AST 27 09/30/2021 0152   ALT 23 10/01/2021 0455   ALT 34 09/30/2021 0152   ALKPHOS 62 10/01/2021 0455   ALKPHOS 79 09/30/2021 0152   BILITOT 0.5 10/01/2021 0455   BILITOT 0.7 09/30/2021 0152   BILITOT 0.3 02/02/2021 0923   BILITOT 0.5 10/04/2020 1019   PROT 7.0 10/01/2021 0455   PROT 8.4 (H) 09/30/2021 0152   PROT 7.0 02/02/2021 0923   PROT 6.9 10/04/2020 1019   ALBUMIN 3.6 10/01/2021 0455   ALBUMIN 4.6 09/30/2021 0152   ALBUMIN 4.3 02/02/2021 0923   ALBUMIN 4.1 10/04/2020 1019    Studies/Results: DG Abd 1 View  Result Date: 09/30/2021 CLINICAL DATA:  Enteric catheter placement EXAM: ABDOMEN - 1 VIEW COMPARISON:  01/28/2019, 09/30/2021 FINDINGS: Supine frontal view of the abdomen and pelvis excludes the bilateral flanks and right hemidiaphragm by collimation. Enteric catheter is coiled over the gastric fundus. Dilated small bowel consistent with obstruction. Excreted contrast within the bladder from previous CT. IMPRESSION: 1. Enteric  catheter coiled over the gastric fundus. 2. Continued small bowel obstruction. Electronically Signed   By: Randa Ngo M.D.   On: 09/30/2021 15:20   CT ABDOMEN PELVIS W CONTRAST  Result Date: 09/30/2021 CLINICAL DATA:  Complicated umbilical hernia, concern for possible incarceration. EXAM: CT ABDOMEN AND PELVIS WITH CONTRAST TECHNIQUE: Multidetector CT imaging of the abdomen and pelvis was performed using the standard protocol following bolus administration of intravenous contrast. CONTRAST:  89mL OMNIPAQUE IOHEXOL 350 MG/ML SOLN COMPARISON:  05/04/2020. FINDINGS: Lower chest: The heart is enlarged and there is no pericardial effusion. Strandy atelectasis is noted at the lung bases. Hepatobiliary: The liver is enlarged and fatty infiltration is noted. No  biliary ductal dilatation. The gallbladder is without stones. Pancreas: Unremarkable. No pancreatic ductal dilatation or surrounding inflammatory changes. Spleen: Normal in size without focal abnormality. Adrenals/Urinary Tract: Adrenal glands are unremarkable. Kidneys are normal, without renal calculi, focal lesion, or hydronephrosis. The bladder is decompressed. Stomach/Bowel: The stomach is within normal limits. Multiple dilated loops of small bowel are seen in the abdomen measuring up to 3.4 cm in diameter to the level of a large umbilical hernia. The bowel in the hernia sac is normal in caliber. Fat stranding and ascites are noted in the hernia sac. The bowel distal to the hernia sac is normal in caliber. No free air or pneumatosis is seen. Multiple scattered diverticula are present along the colon without evidence of diverticulitis. A normal appendix is seen in the right lower quadrant. Vascular/Lymphatic: Aortic atherosclerosis. No enlarged abdominal or pelvic lymph nodes. Reproductive: The uterus is enlarged for patient the stated age. No adnexal mass is seen. Other: Small amount fluid is noted in the umbilical hernia. Musculoskeletal: Degenerative changes in the thoracolumbar spine. No acute osseous abnormality. IMPRESSION: 1. Umbilical hernia containing loops of small-bowel with surrounding fat stranding and a small amount of free fluid. There is dilatation of the small bowel loops proximal to the hernia sac, compatible with small-bowel obstruction. Surgical consultation is recommended. 2. Hepatomegaly with steatosis. 3. Cardiomegaly. 4. Aortic atherosclerosis. Critical findings were reported to Dr. Florina Ou at 3:45 a.m. Electronically Signed   By: Brett Fairy M.D.   On: 09/30/2021 03:48      Sharon Swanson 10/01/2021   Patient ID: Sharon Swanson, female   DOB: [birth year], 61 y.o.   MRN: [id number]

## 2021-10-01 NOTE — Progress Notes (Signed)
PROGRESS NOTE    Sharon Swanson  IWP:[id number] DOB: 02-05-1961 DOA: 09/30/2021 PCP: Vevelyn Francois, NP    Brief Narrative:  Sharon Swanson is a 61 year old female with past medical history significant for type 2 diabetes mellitus, hyperlipidemia, essential hypertension, COPD on 3 L nasal cannula at baseline, chronic diastolic congestive heart failure, morbid obesity who presented to [facility] ED on 1/6 with complaints of abdominal pain associate with nausea/vomiting.  Patient with a history of a ventral hernia and was being followed by general surgery outpatient for eventual repair.  Night prior to hospitalization, patient developed significant abdominal pain with associated distention and nausea/vomiting.  Given progressive symptoms, patient presented to the ED for further evaluation.  In the ED, temperature 97.8 F, HR 98, RR 20, BP 166/75, SPO2 98% on 3 L nasal cannula.  Sodium 135, potassium 4.1, chloride 98, CO2 25, glucose 224, BUN 16, creatinine 0.73.  AST 27, ALT 34, total bilirubin 0.7.  Lipase 46.  WBC 9.6, hemoglobin 14.9, platelets 268.  COVID-19 PCR negative.  Influenza A/B PCR negative.  Urinalysis unrevealing.  CT abdomen/pelvis with contrast with umbilical hernia containing loops of small bowel with surrounding fat stranding and a small amount of free fluid, dilation of the small bowel loops proximal to the hernia sac compatible with small bowel obstruction.  General surgery was consulted.  TRH consulted for further evaluation and management of incarcerated hernia with small bowel obstruction.   Assessment & Plan:   Principal Problem:   SBO (small bowel obstruction) (HCC) Active Problems:   BMI 45.0-49.9, adult (HCC)   Incarcerated umbilical hernia   Pulmonary nodule, left   COPD (chronic obstructive pulmonary disease) (HCC)   Insulin-requiring or dependent type II diabetes mellitus (HCC)   Essential hypertension   OSA (obstructive sleep apnea)   Chronic respiratory failure  (HCC)   Dependence on continuous supplemental oxygen   Incarcerated umbilical hernia Small bowel obstruction Patient presenting to the ED with progressive abdominal pain, distention associate with nausea and vomiting.  CT abdomen/pelvis notable for incarcerated abdominal umbilical hernia with small bowel obstruction.  General surgery was consulted and patient underwent open umbilical hernia repair with mesh by Dr. Zenia Resides on 09/30/2021. --N.p.o., NG tube to LWIS --NS at 75 mL/h; cautious use given her underlying CHF --Further per general surgery  Type 2 diabetes mellitus Home regimen includes glipizide 10 mg p.o. twice daily, Lantus 30 units subcutaneously nightly, metformin 1000 mg p.o. twice daily --Holding home regimen --moderate SSI for coverage --CBGs q4h while NPO  Essential hypertension Chronic diastolic congestive heart failure, compensated Home regimen includes carvedilol 12.5 mg p.o. twice daily, Entresto 97-103 mg p.o. twice daily, furosemide 40 mg p.o. twice daily, spironolactone 25 mg p.o. daily.  Recent TTE 02/24/2021 with LVEF 60 to 65%, mild concentric LVH, LA mildly dilated, mild MR. --BP 169/94 this morning --Holding oral medications given n.p.o. status --Hydralazine 10 mg IV q6h PRN SBP >170 or DBP >110 --Labetalol 10 mg IV q4h PRN SBP >170 or DBP >110 and not controlled with hydralazine --Strict I's and O's and daily weights  Chronic hypoxic respiratory failure COPD Home regimen includes Singulair, Stiolto Respimat.  Follows with pulmonology outpatient, Dr. Vaughan Browner. --Garlon Hatchet neb twice daily and Incruse Ellipta 1 puff daily as hospital substitution --Continue submental oxygen, maintain SPO2 greater than 88%, on 3 L nasal cannula at baseline --Duo neb q6h PRN shortness of breath/wheezing  Weakness/debility: Patient reports ambulatory at baseline with the use of a walker. --PT/OT evaluation  Morbid obesity Body  mass index is 46.99 kg/m.  Discussed with patient needs  for aggressive lifestyle changes/weight loss as this complicates all facets of care.  Outpatient follow-up with PCP.  May benefit from bariatric evaluation outpatient.    DVT prophylaxis: SCDs Start: 09/30/21 5631   Code Status: Full Code Family Communication: No family present at bedside this morning  Disposition Plan:  Level of care: Med-Surg Status is: Inpatient  Remains inpatient appropriate because: N.p.o., IV fluids, NG tube in place.    Consultants:  General surgery  Procedures:  Open umbilical hernia repair with mesh, Dr. Zenia Resides 09/30/2021  Antimicrobials:  Perioperative cefazolin   Subjective: Patient seen examined at bedside, resting comfortably.  Complaining about NG tube in place.  Complains of some mild abdominal discomfort when coughs.  Reports bowel movement last night.  Seen by general surgery this morning, continuing IV fluids, n.p.o. and NG tube.  Needs mobilization.  No other questions or concerns at this time.  Denies headache, no visual changes, no chest pain, no palpitations, no shortness of breath more than her typical baseline, no cough/congestion currently, no fever/chills/night sweats, no nausea/vomiting/diarrhea, no paresthesias.  No acute events overnight per nursing staff.  Objective: Vitals:   09/30/21 1901 09/30/21 2213 10/01/21 0142 10/01/21 0441  BP: (!) 154/76 (!) 164/85 (!) 179/89 (!) 169/94  Pulse: 100 99 90 97  Resp: 16 20 20 18   Temp:  98.2 F (36.8 C) 98.7 F (37.1 C) 98.8 F (37.1 C)  TempSrc:  Oral Oral Oral  SpO2: 92% 97% 96% 98%  Weight:      Height:        Intake/Output Summary (Last 24 hours) at 10/01/2021 1034 Last data filed at 10/01/2021 0600 Gross per 24 hour  Intake 1105.33 ml  Output 72 ml  Net 1033.33 ml   Filed Weights   09/30/21 0035  Weight: 136.1 kg    Examination:  General exam: Appears calm and comfortable, chronically ill appearance, morbidly obese Respiratory system: Clear to auscultation. Respiratory  effort normal.  On 4 L nasal cannula with SPO2 98% at rest; on 3 L nasal cannula at baseline Cardiovascular system: S1 & S2 heard, RRR. No JVD, murmurs, rubs, gallops or clicks.  1+ bilateral pedal edema. Gastrointestinal system: Abdomen is nondistended, binder in place, soft and nontender. No organomegaly or masses felt.  No appreciable bowel sounds, noted NG tube in place to suction Central nervous system: Alert and oriented. No focal neurological deficits. Extremities: Symmetric 5 x 5 power. Skin: Skin tear noted to anterior left lower extremity shin, chronic skin changes bilateral lower extremities, otherwise no concerning lesions, erythema, fluctuance Psychiatry: Judgement and insight appear normal. Mood & affect appropriate.     Data Reviewed: I have personally reviewed following labs and imaging studies  CBC: Recent Labs  Lab 09/30/21 0152 10/01/21 0455  WBC 9.6 9.7  NEUTROABS 8.0*  --   HGB 14.9 12.9  HCT 47.4* 41.2  MCV 91.9 93.2  PLT 268 497   Basic Metabolic Panel: Recent Labs  Lab 09/30/21 0152 10/01/21 0455  NA 135 139  K 4.1 4.2  CL 98 101  CO2 25 28  GLUCOSE 224* 145*  BUN 16 11  CREATININE 0.73 0.65  CALCIUM 10.1 8.6*   GFR: Estimated Creatinine Clearance: 107.9 mL/min (by C-G formula based on SCr of 0.65 mg/dL). Liver Function Tests: Recent Labs  Lab 09/30/21 0152 10/01/21 0455  AST 27 18  ALT 34 23  ALKPHOS 79 62  BILITOT 0.7 0.5  PROT 8.4* 7.0  ALBUMIN 4.6 3.6   Recent Labs  Lab 09/30/21 0152  LIPASE 46   No results for input(s): AMMONIA in the last 168 hours. Coagulation Profile: No results for input(s): INR, PROTIME in the last 168 hours. Cardiac Enzymes: No results for input(s): CKTOTAL, CKMB, CKMBINDEX, TROPONINI in the last 168 hours. BNP (last 3 results) Recent Labs    02/02/21 0923  PROBNP 11   HbA1C: No results for input(s): HGBA1C in the last 72 hours. CBG: Recent Labs  Lab 09/30/21 1056 09/30/21 1437 09/30/21 1708  09/30/21 2210 10/01/21 0745  GLUCAP 140* 163* 181* 157* 125*   Lipid Profile: No results for input(s): CHOL, HDL, LDLCALC, TRIG, CHOLHDL, LDLDIRECT in the last 72 hours. Thyroid Function Tests: No results for input(s): TSH, T4TOTAL, FREET4, T3FREE, THYROIDAB in the last 72 hours. Anemia Panel: No results for input(s): VITAMINB12, FOLATE, FERRITIN, TIBC, IRON, RETICCTPCT in the last 72 hours. Sepsis Labs: No results for input(s): PROCALCITON, LATICACIDVEN in the last 168 hours.  Recent Results (from the past 240 hour(s))  Resp Panel by RT-PCR (Flu A&B, Covid) Nasopharyngeal Swab     Status: None   Collection Time: 09/30/21  4:08 AM   Specimen: Nasopharyngeal Swab; Nasopharyngeal(NP) swabs in vial transport medium  Result Value Ref Range Status   SARS Coronavirus 2 by RT PCR NEGATIVE NEGATIVE Final    Comment: (NOTE) SARS-CoV-2 target nucleic acids are NOT DETECTED.  The SARS-CoV-2 RNA is generally detectable in upper respiratory specimens during the acute phase of infection. The lowest concentration of SARS-CoV-2 viral copies this assay can detect is 138 copies/mL. A negative result does not preclude SARS-Cov-2 infection and should not be used as the sole basis for treatment or other patient management decisions. A negative result may occur with  improper specimen collection/handling, submission of specimen other than nasopharyngeal swab, presence of viral mutation(s) within the areas targeted by this assay, and inadequate number of viral copies(<138 copies/mL). A negative result must be combined with clinical observations, patient history, and epidemiological information. The expected result is Negative.  Fact Sheet for Patients:  EntrepreneurPulse.com.au  Fact Sheet for Healthcare Providers:  IncredibleEmployment.be  This test is no t yet approved or cleared by the [place] FDA and  has been authorized for detection and/or diagnosis  of SARS-CoV-2 by FDA under an Emergency Use Authorization (EUA). This EUA will remain  in effect (meaning this test can be used) for the duration of the COVID-19 declaration under Section 564(b)(1) of the Act, 21 U.S.C.section 360bbb-3(b)(1), unless the authorization is terminated  or revoked sooner.       Influenza A by PCR NEGATIVE NEGATIVE Final   Influenza B by PCR NEGATIVE NEGATIVE Final    Comment: (NOTE) The Xpert Xpress SARS-CoV-2/FLU/RSV plus assay is intended as an aid in the diagnosis of influenza from Nasopharyngeal swab specimens and should not be used as a sole basis for treatment. Nasal washings and aspirates are unacceptable for Xpert Xpress SARS-CoV-2/FLU/RSV testing.  Fact Sheet for Patients: EntrepreneurPulse.com.au  Fact Sheet for Healthcare Providers: IncredibleEmployment.be  This test is not yet approved or cleared by the [place] FDA and has been authorized for detection and/or diagnosis of SARS-CoV-2 by FDA under an Emergency Use Authorization (EUA). This EUA will remain in effect (meaning this test can be used) for the duration of the COVID-19 declaration under Section 564(b)(1) of the Act, 21 U.S.C. section 360bbb-3(b)(1), unless the authorization is terminated or revoked.  Performed at [facility],  [place] [address], [place], [place] [address]          Radiology Studies: DG Abd 1 View  Result Date: 09/30/2021 CLINICAL DATA:  Enteric catheter placement EXAM: ABDOMEN - 1 VIEW COMPARISON:  01/28/2019, 09/30/2021 FINDINGS: Supine frontal view of the abdomen and pelvis excludes the bilateral flanks and right hemidiaphragm by collimation. Enteric catheter is coiled over the gastric fundus. Dilated small bowel consistent with obstruction. Excreted contrast within the bladder from previous CT. IMPRESSION: 1. Enteric catheter coiled over the gastric fundus. 2. Continued small bowel obstruction.  Electronically Signed   By: Randa Ngo M.D.   On: 09/30/2021 15:20   CT ABDOMEN PELVIS W CONTRAST  Result Date: 09/30/2021 CLINICAL DATA:  Complicated umbilical hernia, concern for possible incarceration. EXAM: CT ABDOMEN AND PELVIS WITH CONTRAST TECHNIQUE: Multidetector CT imaging of the abdomen and pelvis was performed using the standard protocol following bolus administration of intravenous contrast. CONTRAST:  30mL OMNIPAQUE IOHEXOL 350 MG/ML SOLN COMPARISON:  05/04/2020. FINDINGS: Lower chest: The heart is enlarged and there is no pericardial effusion. Strandy atelectasis is noted at the lung bases. Hepatobiliary: The liver is enlarged and fatty infiltration is noted. No biliary ductal dilatation. The gallbladder is without stones. Pancreas: Unremarkable. No pancreatic ductal dilatation or surrounding inflammatory changes. Spleen: Normal in size without focal abnormality. Adrenals/Urinary Tract: Adrenal glands are unremarkable. Kidneys are normal, without renal calculi, focal lesion, or hydronephrosis. The bladder is decompressed. Stomach/Bowel: The stomach is within normal limits. Multiple dilated loops of small bowel are seen in the abdomen measuring up to 3.4 cm in diameter to the level of a large umbilical hernia. The bowel in the hernia sac is normal in caliber. Fat stranding and ascites are noted in the hernia sac. The bowel distal to the hernia sac is normal in caliber. No free air or pneumatosis is seen. Multiple scattered diverticula are present along the colon without evidence of diverticulitis. A normal appendix is seen in the right lower quadrant. Vascular/Lymphatic: Aortic atherosclerosis. No enlarged abdominal or pelvic lymph nodes. Reproductive: The uterus is enlarged for patient the stated age. No adnexal mass is seen. Other: Small amount fluid is noted in the umbilical hernia. Musculoskeletal: Degenerative changes in the thoracolumbar spine. No acute osseous abnormality. IMPRESSION: 1.  Umbilical hernia containing loops of small-bowel with surrounding fat stranding and a small amount of free fluid. There is dilatation of the small bowel loops proximal to the hernia sac, compatible with small-bowel obstruction. Surgical consultation is recommended. 2. Hepatomegaly with steatosis. 3. Cardiomegaly. 4. Aortic atherosclerosis. Critical findings were reported to Dr. Florina Ou at 3:45 a.m. Electronically Signed   By: Brett Fairy M.D.   On: 09/30/2021 03:48        Scheduled Meds:  arformoterol  15 mcg Nebulization BID   And   umeclidinium bromide  1 puff Inhalation Daily   insulin aspart  0-15 Units Subcutaneous TID WC   Continuous Infusions:  sodium chloride 100 mL/hr at 10/01/21 0435     LOS: 1 day    Time spent: 45 minutes spent on chart review, discussion with nursing staff, consultants, updating family and interview/physical exam; more than 50% of that time was spent in counseling and/or coordination of care.    Daijon Wenke J [place] DO [facility] Available via Epic secure chat 7am-7pm After these hours, please refer to coverage provider listed on [email or web address] 10/01/2021, 10:34 AM

## 2021-10-01 NOTE — Plan of Care (Signed)
  Problem: Skin Integrity: Goal: Demonstration of wound healing without infection will improve Outcome: Progressing   

## 2021-10-02 ENCOUNTER — Encounter (HOSPITAL_COMMUNITY): Payer: Self-pay | Admitting: Surgery

## 2021-10-02 DIAGNOSIS — K56609 Unspecified intestinal obstruction, unspecified as to partial versus complete obstruction: Secondary | ICD-10-CM | POA: Diagnosis not present

## 2021-10-02 LAB — GLUCOSE, CAPILLARY
Glucose-Capillary: 116 mg/dL — ABNORMAL HIGH (ref 70–99)
Glucose-Capillary: 136 mg/dL — ABNORMAL HIGH (ref 70–99)
Glucose-Capillary: 140 mg/dL — ABNORMAL HIGH (ref 70–99)
Glucose-Capillary: 141 mg/dL — ABNORMAL HIGH (ref 70–99)

## 2021-10-02 MED ORDER — CARVEDILOL 12.5 MG PO TABS
12.5000 mg | ORAL_TABLET | Freq: Two times a day (BID) | ORAL | Status: DC
  Administered 2021-10-02 – 2021-10-06 (×8): 12.5 mg via ORAL
  Filled 2021-10-02 (×8): qty 1

## 2021-10-02 MED ORDER — SACUBITRIL-VALSARTAN 97-103 MG PO TABS
1.0000 | ORAL_TABLET | Freq: Two times a day (BID) | ORAL | Status: DC
  Administered 2021-10-02 – 2021-10-06 (×8): 1 via ORAL
  Filled 2021-10-02 (×8): qty 1

## 2021-10-02 MED ORDER — MONTELUKAST SODIUM 10 MG PO TABS
10.0000 mg | ORAL_TABLET | Freq: Every day | ORAL | Status: DC
  Administered 2021-10-02 – 2021-10-05 (×4): 10 mg via ORAL
  Filled 2021-10-02 (×4): qty 1

## 2021-10-02 NOTE — Progress Notes (Signed)
PROGRESS NOTE    Sharon Swanson  MVE:[id number] DOB: [birth year] DOA: 09/30/2021 PCP: Vevelyn Francois, NP    Brief Narrative:  Sharon Swanson is a 61 year old female with past medical history significant for type 2 diabetes mellitus, hyperlipidemia, essential hypertension, COPD on 3 L nasal cannula at baseline, chronic diastolic congestive heart failure, morbid obesity who presented to [facility] ED on 1/6 with complaints of abdominal pain associate with nausea/vomiting.  Patient with a history of a ventral hernia and was being followed by general surgery outpatient for eventual repair.  Night prior to hospitalization, patient developed significant abdominal pain with associated distention and nausea/vomiting.  Given progressive symptoms, patient presented to the ED for further evaluation.  In the ED, temperature 97.8 F, HR 98, RR 20, BP 166/75, SPO2 98% on 3 L nasal cannula.  Sodium 135, potassium 4.1, chloride 98, CO2 25, glucose 224, BUN 16, creatinine 0.73.  AST 27, ALT 34, total bilirubin 0.7.  Lipase 46.  WBC 9.6, hemoglobin 14.9, platelets 268.  COVID-19 PCR negative.  Influenza A/B PCR negative.  Urinalysis unrevealing.  CT abdomen/pelvis with contrast with umbilical hernia containing loops of small bowel with surrounding fat stranding and a small amount of free fluid, dilation of the small bowel loops proximal to the hernia sac compatible with small bowel obstruction.  General surgery was consulted.  TRH consulted for further evaluation and management of incarcerated hernia with small bowel obstruction.   Assessment & Plan:   Principal Problem:   SBO (small bowel obstruction) (HCC) Active Problems:   BMI 45.0-49.9, adult (HCC)   Incarcerated umbilical hernia   Pulmonary nodule, left   COPD (chronic obstructive pulmonary disease) (HCC)   Insulin-requiring or dependent type II diabetes mellitus (HCC)   Essential hypertension   OSA (obstructive sleep apnea)   Chronic respiratory failure  (HCC)   Dependence on continuous supplemental oxygen   Incarcerated umbilical hernia Small bowel obstruction Patient presenting to the ED with progressive abdominal pain, distention associate with nausea and vomiting.  CT abdomen/pelvis notable for incarcerated abdominal umbilical hernia with small bowel obstruction.  General surgery was consulted and patient underwent open umbilical hernia repair with mesh by Dr. Zenia Resides on 09/30/2021. --N.p.o., NG tube to LWIS --NS at 75 mL/h; cautious use given her underlying CHF --NGT output 366mL past 24h --Further per general surgery; hopeful for possible discontinue NG tube today and advancement of diet  Type 2 diabetes mellitus Home regimen includes glipizide 10 mg p.o. twice daily, Lantus 30 units subcutaneously nightly, metformin 1000 mg p.o. twice daily --Holding home regimen --moderate SSI for coverage --CBGs q4h while NPO  Essential hypertension Chronic diastolic congestive heart failure, compensated Home regimen includes carvedilol 12.5 mg p.o. twice daily, Entresto 97-103 mg p.o. twice daily, furosemide 40 mg p.o. twice daily, spironolactone 25 mg p.o. daily.  Recent TTE 02/24/2021 with LVEF 60 to 65%, mild concentric LVH, LA mildly dilated, mild MR. --BP 155/79 this morning --Holding oral medications given n.p.o. status --Hydralazine 10 mg IV q6h PRN SBP >170 or DBP >110 --Labetalol 10 mg IV q4h PRN SBP >170 or DBP >110 and not controlled with hydralazine --Strict I's and O's and daily weights  Chronic hypoxic respiratory failure COPD Home regimen includes Singulair, Stiolto Respimat.  Follows with pulmonology outpatient, Dr. Vaughan Browner. --Garlon Hatchet neb twice daily and Incruse Ellipta 1 puff daily as hospital substitution --Continue submental oxygen, maintain SPO2 greater than 88%, on 3 L nasal cannula at baseline --Duo neb q6h PRN shortness of breath/wheezing  Weakness/debility: Patient  reports ambulatory at baseline with the use of a walker.   Seen by PT on 1/7 with no follow-up recommended.  Morbid obesity Body mass index is 49.93 kg/m.  Discussed with patient needs for aggressive lifestyle changes/weight loss as this complicates all facets of care.  Outpatient follow-up with PCP.  May benefit from bariatric evaluation outpatient.    DVT prophylaxis: SCDs Start: 09/30/21 2831   Code Status: Full Code Family Communication: No family present at bedside this morning  Disposition Plan:  Level of care: Med-Surg Status is: Inpatient  Remains inpatient appropriate because: N.p.o., IV fluids, NG tube in place.    Consultants:  General surgery  Procedures:  Open umbilical hernia repair with mesh, Dr. Zenia Resides 09/30/2021  Antimicrobials:  Perioperative cefazolin   Subjective: Patient seen examined at bedside, resting comfortably.  Slightly tearful this morning, will like NG tube removed.  States she is thirsty.  Reports flatus.  No other specific complaints or concerns at this time. Denies headache, no visual changes, no chest pain, no palpitations, no shortness of breath more than her typical baseline, no cough/congestion currently, no fever/chills/night sweats, no nausea/vomiting/diarrhea, no paresthesias.  No acute events overnight per nursing staff.  Discussed with general surgery, Dr. Harlow Asa this am; hopeful for discontinuing NG tube today and further advancement of diet.  Objective: Vitals:   10/01/21 2130 10/01/21 2145 10/02/21 0622 10/02/21 0733  BP: (!) 151/72  (!) 155/79   Pulse: (!) 102 91 93   Resp: 16  18   Temp: 98.8 F (37.1 C)  98.7 F (37.1 C)   TempSrc:   Oral   SpO2: (!) 88% 94% 98% 94%  Weight:   (!) 144.6 kg   Height:        Intake/Output Summary (Last 24 hours) at 10/02/2021 0929 Last data filed at 10/02/2021 0600 Gross per 24 hour  Intake 1349.03 ml  Output 1600 ml  Net -250.97 ml   Filed Weights   09/30/21 0035 10/02/21 0622  Weight: 136.1 kg (!) 144.6 kg    Examination:  General exam:  Appears calm and comfortable, chronically ill appearance, morbidly obese, tearful Respiratory system: Clear to auscultation. Respiratory effort normal.  On 4 L nasal cannula with SPO2 98% at rest; on  L nasal cannula (baseline 3L) Cardiovascular system: S1 & S2 heard, RRR. No JVD, murmurs, rubs, gallops or clicks.  1+ bilateral pedal edema. Gastrointestinal system: Abdomen is nondistended, binder in place, soft and nontender. No organomegaly or masses felt.  Faint bowel sounds, noted NG tube in place to suction Central nervous system: Alert and oriented. No focal neurological deficits. Extremities: Symmetric 5 x 5 power. Skin: Skin tear noted to anterior left lower extremity shin, chronic skin changes bilateral lower extremities, otherwise no concerning lesions, erythema, fluctuance Psychiatry: Judgement and insight appear normal. Mood & affect appropriate.     Data Reviewed: I have personally reviewed following labs and imaging studies  CBC: Recent Labs  Lab 09/30/21 0152 10/01/21 0455  WBC 9.6 9.7  NEUTROABS 8.0*  --   HGB 14.9 12.9  HCT 47.4* 41.2  MCV 91.9 93.2  PLT 268 517   Basic Metabolic Panel: Recent Labs  Lab 09/30/21 0152 10/01/21 0455  NA 135 139  K 4.1 4.2  CL 98 101  CO2 25 28  GLUCOSE 224* 145*  BUN 16 11  CREATININE 0.73 0.65  CALCIUM 10.1 8.6*   GFR: Estimated Creatinine Clearance: 111.9 mL/min (by C-G formula based on SCr of 0.65 mg/dL). Liver  Function Tests: Recent Labs  Lab 09/30/21 0152 10/01/21 0455  AST 27 18  ALT 34 23  ALKPHOS 79 62  BILITOT 0.7 0.5  PROT 8.4* 7.0  ALBUMIN 4.6 3.6   Recent Labs  Lab 09/30/21 0152  LIPASE 46   No results for input(s): AMMONIA in the last 168 hours. Coagulation Profile: No results for input(s): INR, PROTIME in the last 168 hours. Cardiac Enzymes: No results for input(s): CKTOTAL, CKMB, CKMBINDEX, TROPONINI in the last 168 hours. BNP (last 3 results) Recent Labs    02/02/21 0923  PROBNP 11    HbA1C: Recent Labs    10/01/21 0455  HGBA1C 7.1*   CBG: Recent Labs  Lab 10/01/21 0745 10/01/21 1108 10/01/21 1647 10/01/21 2126 10/02/21 0750  GLUCAP 125* 127* 135* 156* 116*   Lipid Profile: No results for input(s): CHOL, HDL, LDLCALC, TRIG, CHOLHDL, LDLDIRECT in the last 72 hours. Thyroid Function Tests: No results for input(s): TSH, T4TOTAL, FREET4, T3FREE, THYROIDAB in the last 72 hours. Anemia Panel: No results for input(s): VITAMINB12, FOLATE, FERRITIN, TIBC, IRON, RETICCTPCT in the last 72 hours. Sepsis Labs: No results for input(s): PROCALCITON, LATICACIDVEN in the last 168 hours.  Recent Results (from the past 240 hour(s))  Resp Panel by RT-PCR (Flu A&B, Covid) Nasopharyngeal Swab     Status: None   Collection Time: 09/30/21  4:08 AM   Specimen: Nasopharyngeal Swab; Nasopharyngeal(NP) swabs in vial transport medium  Result Value Ref Range Status   SARS Coronavirus 2 by RT PCR NEGATIVE NEGATIVE Final    Comment: (NOTE) SARS-CoV-2 target nucleic acids are NOT DETECTED.  The SARS-CoV-2 RNA is generally detectable in upper respiratory specimens during the acute phase of infection. The lowest concentration of SARS-CoV-2 viral copies this assay can detect is 138 copies/mL. A negative result does not preclude SARS-Cov-2 infection and should not be used as the sole basis for treatment or other patient management decisions. A negative result may occur with  improper specimen collection/handling, submission of specimen other than nasopharyngeal swab, presence of viral mutation(s) within the areas targeted by this assay, and inadequate number of viral copies(<138 copies/mL). A negative result must be combined with clinical observations, patient history, and epidemiological information. The expected result is Negative.  Fact Sheet for Patients:  EntrepreneurPulse.com.au  Fact Sheet for Healthcare Providers:   IncredibleEmployment.be  This test is no t yet approved or cleared by the [place] FDA and  has been authorized for detection and/or diagnosis of SARS-CoV-2 by FDA under an Emergency Use Authorization (EUA). This EUA will remain  in effect (meaning this test can be used) for the duration of the COVID-19 declaration under Section 564(b)(1) of the Act, 21 U.S.C.section 360bbb-3(b)(1), unless the authorization is terminated  or revoked sooner.       Influenza A by PCR NEGATIVE NEGATIVE Final   Influenza B by PCR NEGATIVE NEGATIVE Final    Comment: (NOTE) The Xpert Xpress SARS-CoV-2/FLU/RSV plus assay is intended as an aid in the diagnosis of influenza from Nasopharyngeal swab specimens and should not be used as a sole basis for treatment. Nasal washings and aspirates are unacceptable for Xpert Xpress SARS-CoV-2/FLU/RSV testing.  Fact Sheet for Patients: EntrepreneurPulse.com.au  Fact Sheet for Healthcare Providers: IncredibleEmployment.be  This test is not yet approved or cleared by the [place] FDA and has been authorized for detection and/or diagnosis of SARS-CoV-2 by FDA under an Emergency Use Authorization (EUA). This EUA will remain in effect (meaning this test can be used) for the  duration of the COVID-19 declaration under Section 564(b)(1) of the Act, 21 U.S.C. section 360bbb-3(b)(1), unless the authorization is terminated or revoked.  Performed at [facility], [place] [address], [place], [place] [address]          Radiology Studies: DG Abd 1 View  Result Date: 09/30/2021 CLINICAL DATA:  Enteric catheter placement EXAM: ABDOMEN - 1 VIEW COMPARISON:  01/28/2019, 09/30/2021 FINDINGS: Supine frontal view of the abdomen and pelvis excludes the bilateral flanks and right hemidiaphragm by collimation. Enteric catheter is coiled over the gastric fundus. Dilated small bowel consistent with  obstruction. Excreted contrast within the bladder from previous CT. IMPRESSION: 1. Enteric catheter coiled over the gastric fundus. 2. Continued small bowel obstruction. Electronically Signed   By: Randa Ngo M.D.   On: 09/30/2021 15:20        Scheduled Meds:  arformoterol  15 mcg Nebulization BID   And   umeclidinium bromide  1 puff Inhalation Daily   insulin aspart  0-15 Units Subcutaneous TID WC   Continuous Infusions:  sodium chloride 75 mL/hr at 10/01/21 1443     LOS: 2 days    Time spent: 41 minutes spent on chart review, discussion with nursing staff, consultants, updating family and interview/physical exam; more than 50% of that time was spent in counseling and/or coordination of care.    Keland Peyton J [place] DO [facility] Available via Epic secure chat 7am-7pm After these hours, please refer to coverage provider listed on [email or web address] 10/02/2021, 9:29 AM

## 2021-10-02 NOTE — Progress Notes (Signed)
Physical Therapy Treatment Patient Details Name: Sharon Swanson MRN: [id number] DOB: 1961-06-04 Today's Date: 10/02/2021   History of Present Illness 61 year old female PMH: significant for type 2 diabetes mellitus, hyperlipidemia, essential hypertension, COPD on 3 L nasal cannula at baseline, chronic diastolic congestive heart failure, morbid obesity admitted with c/o abdominal pain associated with nausea/vomiting.   CT abdomen/pelvis notable for incarcerated abdominal umbilical hernia with small bowel obstruction.  General surgery was consulted and patient underwent open umbilical hernia repair with mesh by Dr. Zenia Resides on 09/30/2021.    PT Comments    Pt progressing with mobility, amb with bariatric rollator today. Needed seated rest after ~ 60' d/t pain and dizziness, resolved with rest.  Pt able to continue. She remains motivated to mobilize.   Recommendations for follow up therapy are one component of a multi-disciplinary discharge planning process, led by the attending physician.  Recommendations may be updated based on patient status, additional functional criteria and insurance authorization.  Follow Up Recommendations  No PT follow up     Assistance Recommended at Discharge Intermittent Supervision/Assistance  Patient can return home with the following Assistance with cooking/housework;Help with stairs or ramp for entrance   Equipment Recommendations  None recommended by PT    Recommendations for Other Services       Precautions / Restrictions Precautions Precautions: Other (comment) Precaution Comments: abd binder,abdominal incision Restrictions Weight Bearing Restrictions: No     Mobility  Bed Mobility               General bed mobility comments: patient was seated on edge of bed at start of session    Transfers Overall transfer level: Needs assistance Equipment used: None Transfers: Sit to/from Stand Sit to Stand: Min guard;Supervision           General  transfer comment: cues to power up with LEs and control descent. incr time needed d/t pain    Ambulation/Gait Ambulation/Gait assistance: Min guard Gait Distance (Feet): 165 Feet Assistive device: Rollator (4 wheels) Gait Pattern/deviations: Step-through pattern;Wide base of support Gait velocity: decr     General Gait Details: slow but overall steady gait with RW support. 1 seated rest break needed d/t pain with coughing; utilized 2 O2 during gait, SpO2 stable   Stairs             Wheelchair Mobility    Modified Rankin (Stroke Patients Only)       Balance Overall balance assessment: No apparent balance deficits (not formally assessed)                                          Cognition Arousal/Alertness: Awake/alert Behavior During Therapy: WFL for tasks assessed/performed Overall Cognitive Status: Within Functional Limits for tasks assessed                                          Exercises      General Comments        Pertinent Vitals/Pain Pain Assessment: Faces Pain Score: 10-Worst pain ever Faces Pain Scale: Hurts even more Pain Location: abd with movement and coughing Pain Descriptors / Indicators: Discomfort;Grimacing;Burning Pain Intervention(s): Limited activity within patient's tolerance;Monitored during session;Premedicated before session    Home Living Family/patient expects to be discharged to:: Private residence Living Arrangements: Spouse/significant other Available  Help at Discharge: Family Type of Home: Mobile home Home Access: Stairs to enter Entrance Stairs-Rails: Right;Left;Can reach both Entrance Stairs-Number of Steps: 3   Home Layout: One level Home Equipment: Rollator (4 wheels)      Prior Function            PT Goals (current goals can now be found in the care plan section) Acute Rehab PT Goals PT Goal Formulation: With patient Time For Goal Achievement: 10/15/21 Potential to Achieve  Goals: Good Progress towards PT goals: Progressing toward goals    Frequency    Min 3X/week      PT Plan Current plan remains appropriate    Co-evaluation              AM-PAC PT "6 Clicks" Mobility   Outcome Measure  Help needed turning from your back to your side while in a flat bed without using bedrails?: A Little Help needed moving from lying on your back to sitting on the side of a flat bed without using bedrails?: A Little Help needed moving to and from a bed to a chair (including a wheelchair)?: A Little Help needed standing up from a chair using your arms (e.g., wheelchair or bedside chair)?: A Little Help needed to walk in hospital room?: A Little Help needed climbing 3-5 steps with a railing? : A Lot 6 Click Score: 17    End of Session Equipment Utilized During Treatment: Gait belt Activity Tolerance: Patient tolerated treatment well Patient left: with call bell/phone within reach;in bed;with family/visitor present Nurse Communication: Mobility status PT Visit Diagnosis: Other abnormalities of gait and mobility (R26.89)     Time: 1610-9604 PT Time Calculation (min) (ACUTE ONLY): 25 min  Charges:  $Gait Training: 23-37 mins                     Baxter Flattery, PT  Acute Rehab Dept ([place]) [phone] Pager [phone]  10/02/2021    [facility] 10/02/2021, 1:29 PM

## 2021-10-02 NOTE — Evaluation (Signed)
Occupational Therapy Evaluation Patient Details Name: Sharon Swanson MRN: [id number] DOB: [birth year] Today's Date: 10/02/2021   History of Present Illness 61 year old female PMH: significant for type 2 diabetes mellitus, hyperlipidemia, essential hypertension, COPD on 3 L nasal cannula at baseline, chronic diastolic congestive heart failure, morbid obesity admitted with c/o abdominal pain associated with nausea/vomiting.   CT abdomen/pelvis notable for incarcerated abdominal umbilical hernia with small bowel obstruction.  General surgery was consulted and patient underwent open umbilical hernia repair with mesh by Dr. Zenia Resides on 09/30/2021.   Clinical Impression   Patient is a 61 year old female who was noted to have pain from recent surgery and decreased functional activity tolerance impacting participation in ADLs. Patient was previously living at home with husband with RW for mobility and 3L/min O2 at baseline. Patient could benefit from additional OT session for eduction on AE for LB dressing/bathing tasks to ensure carryover. Patient  was noted to drop to 85% on 3L/min with toileting tasks with education on deep breathing strategies. Patient would continue to benefit from skilled OT services at this time while admitted to address noted deficits in order to improve overall safety and independence in ADLs.       Recommendations for follow up therapy are one component of a multi-disciplinary discharge planning process, led by the attending physician.  Recommendations may be updated based on patient status, additional functional criteria and insurance authorization.   Follow Up Recommendations  No OT follow up    Assistance Recommended at Discharge Intermittent Supervision/Assistance  Patient can return home with the following Assistance with cooking/housework;A little help with bathing/dressing/bathroom;Other (comment) (if AE is not purchased)    Functional Status Assessment  Patient has had a  recent decline in their functional status and demonstrates the ability to make significant improvements in function in a reasonable and predictable amount of time.  Equipment Recommendations  Other (comment) (total hip kit)    Recommendations for Other Services       Precautions / Restrictions Precautions Precautions: Other (comment) Precaution Comments: abd binder,abdominal precautions Restrictions Weight Bearing Restrictions: No      Mobility Bed Mobility               General bed mobility comments: patient was seated on edge of bed at start of session    Transfers                          Balance Overall balance assessment: No apparent balance deficits (not formally assessed)                                         ADL either performed or assessed with clinical judgement   ADL Overall ADL's : Needs assistance/impaired Eating/Feeding: Modified independent;Sitting Eating/Feeding Details (indicate cue type and reason): patient is currently on clear liquid diet. able to participate in self feeding herself. Grooming: Dance movement psychotherapist;Wash/dry hands;Standing;Modified independent   Upper Body Bathing: Modified independent;Sitting   Lower Body Bathing: Moderate assistance;Sit to/from stand;Sitting/lateral leans Lower Body Bathing Details (indicate cue type and reason): without AE. was educated on long handled sponge and shown one on this date. Upper Body Dressing : Modified independent;Sitting Upper Body Dressing Details (indicate cue type and reason): patient would be able to don shirts herself. patient is max A for donning/doffing abdominal binder. patient reported husband would assist with it at home. patient  currently has two abdominal binders combined together. Lower Body Dressing: Moderate assistance;Sit to/from stand;Sitting/lateral leans Lower Body Dressing Details (indicate cue type and reason): patient was educated on using AE with patient  able to don/doff socks and pants to knees with SUP with AE with increased time. patient reported she would look into getting these items in insurance book Toilet Transfer: Modified Independent;Ambulation;Regular Glass blower/designer Details (indicate cue type and reason): with RW. patient able to complete task herself. Toileting- Clothing Manipulation and Hygiene: Modified independent;Sit to/from stand;Sitting/lateral lean Toileting - Clothing Manipulation Details (indicate cue type and reason): patient was able to complete hygiene herself for toileting tasks. patient was educated on toileting wands and tongs patient reported she would keep it in mind.     Functional mobility during ADLs: Supervision/safety;Rolling walker (2 wheels)       Vision Patient Visual Report: No change from baseline       Perception     Praxis      Pertinent Vitals/Pain Pain Assessment: 0-10 Pain Score: 10-Worst pain ever Pain Location: NG tube removal Pain Descriptors / Indicators: Discomfort;Grimacing;Burning Pain Intervention(s): Monitored during session;Repositioned     Hand Dominance     Extremity/Trunk Assessment Upper Extremity Assessment Upper Extremity Assessment: Overall WFL for tasks assessed   Lower Extremity Assessment Lower Extremity Assessment: Defer to PT evaluation   Cervical / Trunk Assessment Cervical / Trunk Assessment: Normal   Communication Communication Communication: No difficulties   Cognition                                             General Comments       Exercises     Shoulder Instructions      Home Living Family/patient expects to be discharged to:: Private residence Living Arrangements: Spouse/significant other Available Help at Discharge: Family Type of Home: Mobile home Home Access: Stairs to enter Technical brewer of Steps: 3 Entrance Stairs-Rails: Right;Left;Can reach both Home Layout: One level     Bathroom  Shower/Tub: Occupational psychologist: Standard     Home Equipment: Rollator (4 wheels)          Prior Functioning/Environment Prior Level of Function : Independent/Modified Independent             Mobility Comments: amb without device in home, uses rollator in community; sleeps in recliner ADLs Comments: IADLs independent as well per patient report.        OT Problem List: Obesity;Pain;Impaired balance (sitting and/or standing);Decreased knowledge of precautions;Decreased knowledge of use of DME or AE      OT Treatment/Interventions: Self-care/ADL training;DME and/or AE instruction;Therapeutic activities;Balance training;Patient/family education    OT Goals(Current goals can be found in the care plan section) Acute Rehab OT Goals Patient Stated Goal: to get to eat normal food OT Goal Formulation: With patient Time For Goal Achievement: 10/16/21 Potential to Achieve Goals: Good  OT Frequency: Min 1X/week    Co-evaluation              AM-PAC OT "6 Clicks" Daily Activity     Outcome Measure Help from another person eating meals?: None Help from another person taking care of personal grooming?: None Help from another person toileting, which includes using toliet, bedpan, or urinal?: A Little Help from another person bathing (including washing, rinsing, drying)?: A Little Help from another person to put on and  taking off regular upper body clothing?: A Little Help from another person to put on and taking off regular lower body clothing?: A Little 6 Click Score: 20   End of Session Equipment Utilized During Treatment: Rolling walker (2 wheels) Nurse Communication: Mobility status  Activity Tolerance: Patient tolerated treatment well Patient left: in bed;with call bell/phone within reach;with family/visitor present  OT Visit Diagnosis: Unsteadiness on feet (R26.81);Pain                Time: 1100-1132 OT Time Calculation (min): 32 min Charges:  OT  General Charges $OT Visit: 1 Visit OT Evaluation $OT Eval Low Complexity: 1 Low OT Treatments $Self Care/Home Management : 8-22 mins  Jackelyn Poling OTR/L, MS Acute Rehabilitation Department Office# [phone] Pager# [phone]   Marcellina Millin 10/02/2021, 12:47 PM

## 2021-10-02 NOTE — Progress Notes (Signed)
Assessment & Plan: POD#2 - open umbilical hernia repair with mesh - Dr. Michaelle Birks             NG removed this AM - start CL diet             Abd binder             OOB, ambulate in halls        [name], MD       [facility]       Office: [phone]   Chief Complaint: Incarcerated umbilical hernia with SBO  Subjective: Patient in bed, wants NG out, hungry; ambulating in halls yesterday  Objective: Vital signs in last 24 hours: Temp:  [97.9 F (36.6 C)-98.8 F (37.1 C)] 98.7 F (37.1 C) (01/08 0622) Pulse Rate:  [89-102] 93 (01/08 0622) Resp:  [16-18] 18 (01/08 0622) BP: (112-155)/(72-96) 155/79 (01/08 0622) SpO2:  [88 %-98 %] 94 % (01/08 0733) Weight:  [144.6 kg] 144.6 kg (01/08 0622) Last BM Date: 10/01/21  Intake/Output from previous day: 01/07 0701 - 01/08 0700 In: 1349 [I.V.:1349] Out: 1600 [Urine:1250; Emesis/NG output:350] Intake/Output this shift: No intake/output data recorded.  Physical Exam: HEENT - sclerae clear, mucous membranes moist Neck - soft Abdomen - soft, obese; binder on; wound dry and intact; BS present Neuro - alert & oriented, no focal deficits  Lab Results:  Recent Labs    09/30/21 0152 10/01/21 0455  WBC 9.6 9.7  HGB 14.9 12.9  HCT 47.4* 41.2  PLT 268 243   BMET Recent Labs    09/30/21 0152 10/01/21 0455  NA 135 139  K 4.1 4.2  CL 98 101  CO2 25 28  GLUCOSE 224* 145*  BUN 16 11  CREATININE 0.73 0.65  CALCIUM 10.1 8.6*   PT/INR No results for input(s): LABPROT, INR in the last 72 hours. Comprehensive Metabolic Panel:    Component Value Date/Time   NA 139 10/01/2021 0455   NA 135 09/30/2021 0152   NA 142 02/02/2021 0923   NA 139 10/04/2020 1019   K 4.2 10/01/2021 0455   K 4.1 09/30/2021 0152   CL 101 10/01/2021 0455   CL 98 09/30/2021 0152   CO2 28 10/01/2021 0455   CO2 25 09/30/2021 0152   BUN 11 10/01/2021 0455   BUN 16 09/30/2021 0152   BUN 14 02/02/2021 0923   BUN 10  10/04/2020 1019   CREATININE 0.65 10/01/2021 0455   CREATININE 0.73 09/30/2021 0152   GLUCOSE 145 (H) 10/01/2021 0455   GLUCOSE 224 (H) 09/30/2021 0152   CALCIUM 8.6 (L) 10/01/2021 0455   CALCIUM 10.1 09/30/2021 0152   AST 18 10/01/2021 0455   AST 27 09/30/2021 0152   ALT 23 10/01/2021 0455   ALT 34 09/30/2021 0152   ALKPHOS 62 10/01/2021 0455   ALKPHOS 79 09/30/2021 0152   BILITOT 0.5 10/01/2021 0455   BILITOT 0.7 09/30/2021 0152   BILITOT 0.3 02/02/2021 0923   BILITOT 0.5 10/04/2020 1019   PROT 7.0 10/01/2021 0455   PROT 8.4 (H) 09/30/2021 0152   PROT 7.0 02/02/2021 0923   PROT 6.9 10/04/2020 1019   ALBUMIN 3.6 10/01/2021 0455   ALBUMIN 4.6 09/30/2021 0152   ALBUMIN 4.3 02/02/2021 0923   ALBUMIN 4.1 10/04/2020 1019    Studies/Results: DG Abd 1 View  Result Date: 09/30/2021 CLINICAL DATA:  Enteric catheter placement EXAM: ABDOMEN - 1 VIEW COMPARISON:  01/28/2019, 09/30/2021 FINDINGS: Supine frontal view of the abdomen  and pelvis excludes the bilateral flanks and right hemidiaphragm by collimation. Enteric catheter is coiled over the gastric fundus. Dilated small bowel consistent with obstruction. Excreted contrast within the bladder from previous CT. IMPRESSION: 1. Enteric catheter coiled over the gastric fundus. 2. Continued small bowel obstruction. Electronically Signed   By: Randa Ngo M.D.   On: 09/30/2021 15:20      [name] 10/02/2021   Patient ID: Sharon Swanson, female   DOB: November 04, 1960, 61 y.o.   MRN: [id number]

## 2021-10-03 DIAGNOSIS — K56609 Unspecified intestinal obstruction, unspecified as to partial versus complete obstruction: Secondary | ICD-10-CM | POA: Diagnosis not present

## 2021-10-03 LAB — CBC
HCT: 38.2 % (ref 36.0–46.0)
Hemoglobin: 11.7 g/dL — ABNORMAL LOW (ref 12.0–15.0)
MCH: 29.3 pg (ref 26.0–34.0)
MCHC: 30.6 g/dL (ref 30.0–36.0)
MCV: 95.5 fL (ref 80.0–100.0)
Platelets: 221 10*3/uL (ref 150–400)
RBC: 4 MIL/uL (ref 3.87–5.11)
RDW: 13.9 % (ref 11.5–15.5)
WBC: 5.7 10*3/uL (ref 4.0–10.5)
nRBC: 0 % (ref 0.0–0.2)

## 2021-10-03 LAB — GLUCOSE, CAPILLARY
Glucose-Capillary: 134 mg/dL — ABNORMAL HIGH (ref 70–99)
Glucose-Capillary: 147 mg/dL — ABNORMAL HIGH (ref 70–99)
Glucose-Capillary: 156 mg/dL — ABNORMAL HIGH (ref 70–99)
Glucose-Capillary: 170 mg/dL — ABNORMAL HIGH (ref 70–99)

## 2021-10-03 LAB — BASIC METABOLIC PANEL
Anion gap: 9 (ref 5–15)
BUN: 7 mg/dL (ref 6–20)
CO2: 27 mmol/L (ref 22–32)
Calcium: 8.5 mg/dL — ABNORMAL LOW (ref 8.9–10.3)
Chloride: 103 mmol/L (ref 98–111)
Creatinine, Ser: 0.6 mg/dL (ref 0.44–1.00)
GFR, Estimated: >60 mL/min (ref >60)
Glucose, Bld: 137 mg/dL — ABNORMAL HIGH (ref 70–99)
Potassium: 3.6 mmol/L (ref 3.5–5.1)
Sodium: 139 mmol/L (ref 135–145)

## 2021-10-03 MED ORDER — ENOXAPARIN SODIUM 40 MG/0.4ML IJ SOSY
40.0000 mg | PREFILLED_SYRINGE | Freq: Two times a day (BID) | INTRAMUSCULAR | Status: DC
  Administered 2021-10-03 – 2021-10-06 (×6): 40 mg via SUBCUTANEOUS
  Filled 2021-10-03 (×6): qty 0.4

## 2021-10-03 MED ORDER — OXYCODONE HCL 5 MG PO TABS
5.0000 mg | ORAL_TABLET | ORAL | Status: DC | PRN
  Administered 2021-10-03 – 2021-10-04 (×2): 10 mg via ORAL
  Filled 2021-10-03 (×3): qty 2

## 2021-10-03 MED ORDER — MORPHINE SULFATE (PF) 4 MG/ML IV SOLN
4.0000 mg | INTRAVENOUS | Status: DC | PRN
  Administered 2021-10-03 – 2021-10-04 (×3): 4 mg via INTRAVENOUS
  Filled 2021-10-03 (×3): qty 1

## 2021-10-03 MED ORDER — PANTOPRAZOLE SODIUM 40 MG PO TBEC
40.0000 mg | DELAYED_RELEASE_TABLET | Freq: Every day | ORAL | Status: DC
  Administered 2021-10-03 – 2021-10-06 (×4): 40 mg via ORAL
  Filled 2021-10-03 (×4): qty 1

## 2021-10-03 MED ORDER — SPIRONOLACTONE 25 MG PO TABS
25.0000 mg | ORAL_TABLET | Freq: Every day | ORAL | Status: DC
  Administered 2021-10-03 – 2021-10-06 (×4): 25 mg via ORAL
  Filled 2021-10-03 (×4): qty 1

## 2021-10-03 MED ORDER — ACETAMINOPHEN 325 MG PO TABS
650.0000 mg | ORAL_TABLET | Freq: Four times a day (QID) | ORAL | Status: DC
  Administered 2021-10-03 – 2021-10-06 (×12): 650 mg via ORAL
  Filled 2021-10-03 (×13): qty 2

## 2021-10-03 MED ORDER — HYDRALAZINE HCL 25 MG PO TABS: 25.0000 mg | ORAL_TABLET | Freq: Three times a day (TID) | ORAL | Status: DC | PRN

## 2021-10-03 NOTE — Discharge Instructions (Signed)
[facility] _______[facility]  UMBILICAL OR INGUINAL HERNIA REPAIR: POST OP INSTRUCTIONS  Always review your discharge instruction sheet given to you by the facility where your surgery was performed. IF YOU HAVE DISABILITY OR FAMILY LEAVE FORMS, YOU MUST BRING THEM TO THE OFFICE FOR PROCESSING.   DO NOT GIVE THEM TO YOUR DOCTOR.  1. A  prescription for pain medication may be given to you upon discharge.  Take your pain medication as prescribed, if needed.  If narcotic pain medicine is not needed, then you may take acetaminophen (Tylenol) or ibuprofen (Advil) as needed. 2. Take your usually prescribed medications unless otherwise directed. If you need a refill on your pain medication, please contact your pharmacy.  They will contact our office to request authorization. Prescriptions will not be filled after 5 pm or on week-ends. 3. You should follow a light diet the first 24 hours after arrival home, such as soup and crackers, etc.  Be sure to include lots of fluids daily.  Resume your normal diet the day after surgery. 4.Most patients will experience some swelling and bruising around the umbilicus or in the groin and scrotum.  Ice packs and reclining will help.  Swelling and bruising can take several days to resolve.  6. It is common to experience some constipation if taking pain medication after surgery.  Increasing fluid intake and taking a stool softener (such as Colace) will usually help or prevent this problem from occurring.  A mild laxative (Milk of Magnesia or Miralax) should be taken according to package directions if there are no bowel movements after 48 hours. 7. Unless discharge instructions indicate otherwise, you may remove your bandages 24-48 hours after surgery, and you may shower at that time.  You may have steri-strips (small skin tapes) in place directly over the incision.  These strips should be left on the skin for 7-10 days.  If your surgeon used skin glue on the  incision, you may shower in 24 hours.  The glue will flake off over the next 2-3 weeks.  Any sutures or staples will be removed at the office during your follow-up visit. 8. ACTIVITIES:  You may resume regular (light) daily activities beginning the next day--such as daily self-care, walking, climbing stairs--gradually increasing activities as tolerated.  You may have sexual intercourse when it is comfortable.  Refrain from any heavy lifting or straining until approved by your doctor.  a.You may drive when you are no longer taking prescription pain medication, you can comfortably wear a seatbelt, and you can safely maneuver your car and apply brakes.   9.You should see your doctor in the office for a follow-up appointment approximately 2-3 weeks after your surgery.  Make sure that you call for this appointment within a day or two after you arrive home to insure a convenient appointment time.   WHEN TO CALL YOUR DOCTOR: Fever over 101.0 Inability to urinate Nausea and/or vomiting Extreme swelling or bruising Continued bleeding from incision. Increased pain, redness, or drainage from the incision  The clinic staff is available to answer your questions during regular business hours.  Please don't hesitate to call and ask to speak to one of the nurses for clinical concerns.  If you have a medical emergency, go to the nearest emergency room or call 911.  A surgeon from [facility] is always on call at the hospital   [address], [address], [place], [place]  [address] ?  [address] [address], [place], [place]     [address] [phone] ? [phone] ? FAX [phone] Web site: [email or web address]  

## 2021-10-03 NOTE — Progress Notes (Signed)
Patient informed RN that she's concerned about her right leg that has been numb since she had surgery. She also stated that her lower legs appear darker than before surgery. MD notified of patients concerns.

## 2021-10-03 NOTE — Progress Notes (Signed)
Progress Note  3 Days Post-Op  Subjective: Pt tolerated CLD and having bowel function. She denies n/v. She reports binder is annoying to wear because it feels bulky.   Objective: Vital signs in last 24 hours: Temp:  [98.2 F (36.8 C)-99.7 F (37.6 C)] 98.2 F (36.8 C) (01/09 1353) Pulse Rate:  [67-90] 73 (01/09 1353) Resp:  [16-18] 18 (01/09 1353) BP: (148-177)/(76-95) 148/78 (01/09 1353) SpO2:  [94 %-99 %] 94 % (01/09 1353) FiO2 (%):  [28 %] 28 % (01/09 0724) Weight:  [142.6 kg] 142.6 kg (01/09 0500) Last BM Date: 10/01/21  Intake/Output from previous day: 01/08 0701 - 01/09 0700 In: 2727.7 [P.O.:1380; I.V.:1347.7] Out: 800 [Urine:800] Intake/Output this shift: Total I/O In: 600 [P.O.:600] Out: 500 [Urine:500]  PE: General: pleasant, WD, morbidly obese female who is up in chair in NAD Heart: regular, rate, and rhythm.   Lungs: Respiratory effort nonlabored Abd: soft, appropriately ttp, incision with surgical dressing present and clean, binder present  Psych: A&Ox3 with an appropriate affect.    Lab Results:  Recent Labs    10/01/21 0455 10/03/21 0426  WBC 9.7 5.7  HGB 12.9 11.7*  HCT 41.2 38.2  PLT 243 221   BMET Recent Labs    10/01/21 0455 10/03/21 0426  NA 139 139  K 4.2 3.6  CL 101 103  CO2 28 27  GLUCOSE 145* 137*  BUN 11 7  CREATININE 0.65 0.60  CALCIUM 8.6* 8.5*   PT/INR No results for input(s): LABPROT, INR in the last 72 hours. CMP     Component Value Date/Time   NA 139 10/03/2021 0426   NA 142 02/02/2021 0923   K 3.6 10/03/2021 0426   CL 103 10/03/2021 0426   CO2 27 10/03/2021 0426   GLUCOSE 137 (H) 10/03/2021 0426   BUN 7 10/03/2021 0426   BUN 14 02/02/2021 0923   CREATININE 0.60 10/03/2021 0426   CALCIUM 8.5 (L) 10/03/2021 0426   PROT 7.0 10/01/2021 0455   PROT 7.0 02/02/2021 0923   ALBUMIN 3.6 10/01/2021 0455   ALBUMIN 4.3 02/02/2021 0923   AST 18 10/01/2021 0455   ALT 23 10/01/2021 0455   ALKPHOS 62 10/01/2021 0455    BILITOT 0.5 10/01/2021 0455   BILITOT 0.3 02/02/2021 0923   GFRNONAA >60 10/03/2021 0426   GFRAA 111 10/04/2020 1019   Lipase     Component Value Date/Time   LIPASE 46 09/30/2021 0152       Studies/Results: No results found.  Anti-infectives: Anti-infectives (From admission, onward)    Start     Dose/Rate Route Frequency Ordered Stop   09/30/21 1130  ceFAZolin (ANCEF) IVPB 3g/100 mL premix        3 g 200 mL/hr over 30 Minutes Intravenous On call to O.R. 09/30/21 0836 09/30/21 1205        Assessment/Plan POD3 s/p umbilical hernia repair with mesh - Dr. Michaelle Birks - tolerating CLD, advance to FLD and possibly to soft this evening if tolerating well - continue binder when OOB - continue mobilization  - start some scheduled tylenol and PO oxycodone q4h prn for pain control, try to wean IV pain meds - possibly ready for discharge from a surgical standpoint in 1-2 days   FEN: FLD VTE: ok for chemical prophylaxis from a surgical standpoint  ID: ancef pre-op   COPD on home oxygen HTN Chronic diastolic CHF O2UM Morbid obesity - BMI 49.24  LOS: 3 days   I reviewed hospitalist notes, last 24  h vitals and pain scores, last 48 h intake and output, and last 24 h labs and trends.  This care required low level of medical decision making.    Norm Parcel, [facility] 10/03/2021, 2:24 PM Please see Amion for pager number during day hours 7:00am-4:30pm

## 2021-10-03 NOTE — Progress Notes (Signed)
ANTICOAGULATION CONSULT NOTE - Initial Consult  Pharmacy Consult for LMWH Indication: VTE prophylaxis  Allergies  Allergen Reactions   Aspirin Other (See Comments)    Abdominal pain Abdominal pain Abdominal pain Abdominal pain Abdominal pain Abdominal pain Abdominal pain    Patient Measurements: Height: 5' 7"  (170.2 cm) Weight: (!) 142.6 kg (314 lb 6 oz) IBW/kg (Calculated) : 61.6 Heparin Dosing Weight:   Vital Signs: Temp: 98.2 F (36.8 C) (01/09 1353) Temp Source: Oral (01/09 1353) BP: 148/78 (01/09 1353) Pulse Rate: 73 (01/09 1353)  Labs: Recent Labs    10/01/21 0455 10/03/21 0426  HGB 12.9 11.7*  HCT 41.2 38.2  PLT 243 221  CREATININE 0.65 0.60    Estimated Creatinine Clearance: 111 mL/min (by C-G formula based on SCr of 0.6 mg/dL).   Medical History: Past Medical History:  Diagnosis Date   Abnormal Pap smear of cervix 10/2020   Acid reflux    Asthma    Atypical squamous cells of undetermined significance (ASC-US) on cervical Pap smear 10/2020   Bilateral lower extremity edema 01/2020   COPD (chronic obstructive pulmonary disease) (HCC)    CPAP (continuous positive airway pressure) dependence    Diabetes ([place])    Diabetes mellitus without complication ([place])    Type II   Fatty liver 11/08/2011   Fibroids 11/08/2011   Hyperglycemia    Hyperlipidemia 05/2020   Hypertension    Microalbuminuria 01/2020   Obesity (BMI 30-39.9) 11/08/2011   Osteoarthritis of right acromioclavicular joint    Pancreatitis 11/08/2011   Rotator cuff tear, right    Shortness of breath on exertion 01/2020   Sleep apnea    Ventral hernia    Vitamin D deficiency 05/2020    Medications:  Medications Prior to Admission  Medication Sig Dispense Refill Last Dose   albuterol (VENTOLIN HFA) 108 (90 Base) MCG/ACT inhaler USE 2 INHALATIONS BY MOUTH  INTO THE LUNGS EVERY 4  HOURS AS NEEDED FOR  WHEEZING OR SHORTNESS OF  BREATH (Patient taking differently: 2 puffs every 4 (four)  hours as needed for wheezing or shortness of breath.) 51 g 3    carvedilol (COREG) 12.5 MG tablet Take 1 tablet (12.5 mg total) by mouth 2 (two) times daily. 180 tablet 3 09/29/2021 at 0600   cetirizine (ZYRTEC) 10 MG tablet Take 1 tablet (10 mg total) by mouth in the morning. (Patient taking differently: Take 10 mg by mouth daily as needed for allergies.) 90 tablet 3 unk   docusate sodium (COLACE) 100 MG capsule Take 1 capsule (100 mg total) by mouth 3 (three) times daily as needed for mild constipation. 270 capsule 3 unk   DULoxetine (CYMBALTA) 30 MG capsule Take 1 capsule (30 mg total) by mouth daily. 30 mg daily x 1 week; then increase to 60 mg daily- for nerve and back pain (Patient taking differently: Take 60 mg by mouth daily. 60 mg daily- for nerve and back pain) 60 capsule 3 09/29/2021   ENTRESTO 97-103 MG Take 1 tablet by mouth 2 (two) times daily. 180 tablet 3 09/29/2021   fluticasone (FLONASE) 50 MCG/ACT nasal spray Place 1 spray into both nostrils daily.   09/29/2021   furosemide (LASIX) 40 MG tablet Take 1 tablet (40 mg total) by mouth 2 (two) times daily. 180 tablet 3 09/29/2021   glipiZIDE (GLUCOTROL) 10 MG tablet Take 1 tablet (10 mg total) by mouth 2 (two) times daily before a meal. 180 tablet 3 09/29/2021   insulin aspart (NOVOLOG) 100 UNIT/ML injection  Inject 20 Units into the skin 3 (three) times daily with meals. (Patient taking differently: Inject 20 Units into the skin 3 (three) times daily with meals as needed for high blood sugar.) 54 mL 3 Past Month   insulin glargine (LANTUS) 100 UNIT/ML injection Inject 0.6 mLs (60 Units total) into the skin daily. (Patient taking differently: Inject 30 Units into the skin at bedtime.) 30 mL 3 09/26/2021   ipratropium-albuterol (DUONEB) 0.5-2.5 (3) MG/3ML SOLN Take 3 mLs by nebulization every 6 (six) hours as needed (shortness of breath, wheeze). 360 mL 12 unk   metFORMIN (GLUCOPHAGE) 1000 MG tablet Take 1 tablet (1,000 mg total) by mouth 2 (two) times  daily with a meal. 180 tablet 3 09/28/2021   methocarbamol (ROBAXIN) 750 MG tablet Take 1 tablet (750 mg total) by mouth 3 (three) times daily as needed for muscle spasms. 270 tablet 3 09/29/2021   montelukast (SINGULAIR) 10 MG tablet Take 1 tablet (10 mg total) by mouth at bedtime. 90 tablet 3 09/27/2021   ondansetron (ZOFRAN) 4 MG tablet Take 1 tablet (4 mg total) by mouth every 8 (eight) hours as needed for nausea or vomiting. 60 tablet 0 09/29/2021   Tiotropium Bromide-Olodaterol (STIOLTO RESPIMAT) 2.5-2.5 MCG/ACT AERS Inhale 2 puffs into the lungs daily. 12 g 3 Past Week   Vitamin D, Ergocalciferol, (DRISDOL) 1.25 MG (50000 UNIT) CAPS capsule TAKE 1 CAPSULE (50,000 UNITS TOTAL) BY MOUTH EVERY 7 (SEVEN) DAYS. (Patient taking differently: Take 50,000 Units by mouth every 7 (seven) days. Friday) 5 capsule 6 09/23/2021   atorvastatin (LIPITOR) 20 MG tablet Take 1 tablet (20 mg total) by mouth daily. (Patient not taking: Reported on 09/30/2021) 90 tablet 3 Not Taking   Continuous Blood Gluc Receiver (FREESTYLE LIBRE 2 READER) DEVI Use as directed daily. 1 each 1    Continuous Blood Gluc Sensor (FREESTYLE LIBRE 2 SENSOR) MISC 1 application by Does not apply route every 14 (fourteen) days. 2 each 11    Continuous Blood Gluc Transmit (DEXCOM G6 TRANSMITTER) MISC Change every 10 days as needed/directed 4 each 6    Continuous Blood Gluc Transmit (DEXCOM G6 TRANSMITTER) MISC Use as directed (Patient not taking: Reported on 06/08/2021) 6 each 3    Dulaglutide 0.75 MG/0.5ML SOPN INJECT 0.75 MG INTO THE SKIN ONCE A WEEK. (Patient not taking: Reported on 09/30/2021) 2 mL 11 Not Taking   gabapentin (NEURONTIN) 300 MG capsule Take 1 capsule (300 mg total) by mouth 3 (three) times daily. (Patient not taking: Reported on 09/30/2021) 270 capsule 3 Not Taking   glucose monitoring kit (FREESTYLE) monitoring kit 1 each by Does not apply route as needed for other. 1 each 0    pantoprazole (PROTONIX) 40 MG tablet Take 1 tablet (40 mg  total) by mouth daily. (Patient not taking: Reported on 09/30/2021) 90 tablet 3 Not Taking   spironolactone (ALDACTONE) 25 MG tablet TAKE 1 TABLET (25 MG TOTAL) BY MOUTH DAILY. 30 tablet 6     Assessment: Pharmacy consulted to dose LMWH for VTE px. BMI 49. Hg 11.7. PLT 221. SCr 0.6   Plan:  Lovenox 40 mg sq q12 hours for VTE px in pt w/ BMI 49  Pharmacy to sign off  Eudelia Bunch, Pharm.D 10/03/2021 3:19 PM

## 2021-10-03 NOTE — Progress Notes (Signed)
°  Transition of Care [facility]) Screening Note   Patient Details  Name: Sharon Swanson Date of Birth: 1961/04/09   Transition of Care [facility]) CM/SW Contact:    Lennart Pall, LCSW Phone Number: 10/03/2021, 10:28 AM    Transition of Care Department [facility]) has reviewed patient and no TOC needs have been identified at this time. We will continue to monitor patient advancement through interdisciplinary progression rounds. If new patient transition needs arise, please place a TOC consult.

## 2021-10-03 NOTE — Progress Notes (Signed)
PROGRESS NOTE    Sharon Swanson  ZOX:[id number] DOB: 03/22/1961 DOA: 09/30/2021 PCP: Vevelyn Francois, NP    Brief Narrative:  Sharon Swanson is a 61 year old female with past medical history significant for type 2 diabetes mellitus, hyperlipidemia, essential hypertension, COPD on 3 L nasal cannula at baseline, chronic diastolic congestive heart failure, morbid obesity who presented to [facility] ED on 1/6 with complaints of abdominal pain associate with nausea/vomiting.  Patient with a history of a ventral hernia and was being followed by general surgery outpatient for eventual repair.  Night prior to hospitalization, patient developed significant abdominal pain with associated distention and nausea/vomiting.  Given progressive symptoms, patient presented to the ED for further evaluation.  In the ED, temperature 97.8 F, HR 98, RR 20, BP 166/75, SPO2 98% on 3 L nasal cannula.  Sodium 135, potassium 4.1, chloride 98, CO2 25, glucose 224, BUN 16, creatinine 0.73.  AST 27, ALT 34, total bilirubin 0.7.  Lipase 46.  WBC 9.6, hemoglobin 14.9, platelets 268.  COVID-19 PCR negative.  Influenza A/B PCR negative.  Urinalysis unrevealing.  CT abdomen/pelvis with contrast with umbilical hernia containing loops of small bowel with surrounding fat stranding and a small amount of free fluid, dilation of the small bowel loops proximal to the hernia sac compatible with small bowel obstruction.  General surgery was consulted.  TRH consulted for further evaluation and management of incarcerated hernia with small bowel obstruction.   Assessment & Plan:   Principal Problem:   SBO (small bowel obstruction) (HCC) Active Problems:   BMI 45.0-49.9, adult (HCC)   Incarcerated umbilical hernia   Pulmonary nodule, left   COPD (chronic obstructive pulmonary disease) (HCC)   Insulin-requiring or dependent type II diabetes mellitus (HCC)   Essential hypertension   OSA (obstructive sleep apnea)   Chronic respiratory failure  (HCC)   Dependence on continuous supplemental oxygen   Incarcerated umbilical hernia Small bowel obstruction Patient presenting to the ED with progressive abdominal pain, distention associate with nausea and vomiting.  CT abdomen/pelvis notable for incarcerated abdominal umbilical hernia with small bowel obstruction.  General surgery was consulted and patient underwent open umbilical hernia repair with mesh by Dr. Zenia Resides on 09/30/2021. --Clear liquid diet --Further per general surgery  Type 2 diabetes mellitus Home regimen includes glipizide 10 mg p.o. twice daily, Lantus 30 units subcutaneously nightly, metformin 1000 mg p.o. twice daily --Holding home regimen --moderate SSI for coverage --CBGs qAC/HS   Essential hypertension Chronic diastolic congestive heart failure, compensated Home regimen includes carvedilol 12.5 mg p.o. twice daily, Entresto 97-103 mg p.o. twice daily, furosemide 40 mg p.o. twice daily, spironolactone 25 mg p.o. daily.  Recent TTE 02/24/2021 with LVEF 60 to 65%, mild concentric LVH, LA mildly dilated, mild MR. --Carvedilol 12.5 mg p.o. twice daily --Entresto 97-103 mg p.o. twice daily --Restart home spironolactone 25 mg p.o. daily today --Hold home furosemide for now --Hydralazine 25mg  PO q8h PRN SBP >170 or DBP >110 --Strict I's and O's and daily weights  Chronic hypoxic respiratory failure COPD Home regimen includes Singulair, Stiolto Respimat.  Follows with pulmonology outpatient, Dr. Vaughan Browner. --Garlon Hatchet neb twice daily and Incruse Ellipta 1 puff daily as hospital substitution --Continue supplemental oxygen, maintain SPO2 > 88%, on 3 L nasal cannula at baseline --Duo neb q6h PRN shortness of breath/wheezing  Weakness/debility: Patient reports ambulatory at baseline with the use of a walker.  Seen by PT on 1/7 with no follow-up recommended.  Morbid obesity Body mass index is 49.24 kg/m.  Discussed with  patient needs for aggressive lifestyle changes/weight loss  as this complicates all facets of care.  Outpatient follow-up with PCP.  May benefit from bariatric evaluation outpatient.    DVT prophylaxis: SCDs Start: 09/30/21 6301   Code Status: Full Code Family Communication: No family present at bedside this morning  Disposition Plan:  Level of care: Med-Surg Status is: Inpatient  Remains inpatient appropriate because: Needs further advancement of diet per general surgery    Consultants:  General surgery  Procedures:  Open umbilical hernia repair with mesh, Dr. Zenia Resides 09/30/2021  Antimicrobials:  Perioperative cefazolin   Subjective: Patient seen examined at bedside, resting comfortably.  Sitting at edge of bed.  Awaiting broth this morning for breakfast.  Reports mild abdominal discomfort, bowel movements overnight.  NG tube was removed yesterday.  No other specific complaints or concerns at this time. Denies headache, no visual changes, no chest pain, no palpitations, no shortness of breath more than her typical baseline, no cough/congestion currently, no fever/chills/night sweats, no nausea/vomiting/diarrhea, no paresthesias.  No acute events overnight per nursing staff.    Objective: Vitals:   10/03/21 0500 10/03/21 0625 10/03/21 0722 10/03/21 0724  BP: (!) 173/95 (!) 175/87    Pulse: 76 67    Resp: 16     Temp: 98.8 F (37.1 C)     TempSrc: Oral     SpO2: 99%  99% 99%  Weight: (!) 142.6 kg     Height:        Intake/Output Summary (Last 24 hours) at 10/03/2021 0911 Last data filed at 10/03/2021 0600 Gross per 24 hour  Intake 2727.65 ml  Output 800 ml  Net 1927.65 ml   Filed Weights   09/30/21 0035 10/02/21 0622 10/03/21 0500  Weight: 136.1 kg (!) 144.6 kg (!) 142.6 kg    Examination:  General exam: Appears calm and comfortable, chronically ill appearance, morbidly obese, tearful Respiratory system: Clear to auscultation. Respiratory effort normal.  On 2L nasal cannula with SPO2 99% at rest; (baseline 3L) Cardiovascular  system: S1 & S2 heard, RRR. No JVD, murmurs, rubs, gallops or clicks.  1+ bilateral pedal edema. Gastrointestinal system: Abdomen is nondistended, binder in place, soft and nontender. No organomegaly or masses felt.  Faint bowel sounds, noted NG tube in place to suction Central nervous system: Alert and oriented. No focal neurological deficits. Extremities: Symmetric 5 x 5 power. Skin: Skin tear noted to anterior left lower extremity shin, chronic skin changes bilateral lower extremities, otherwise no concerning lesions, erythema, fluctuance Psychiatry: Judgement and insight appear normal. Mood & affect appropriate.     Data Reviewed: I have personally reviewed following labs and imaging studies  CBC: Recent Labs  Lab 09/30/21 0152 10/01/21 0455 10/03/21 0426  WBC 9.6 9.7 5.7  NEUTROABS 8.0*  --   --   HGB 14.9 12.9 11.7*  HCT 47.4* 41.2 38.2  MCV 91.9 93.2 95.5  PLT 268 243 601   Basic Metabolic Panel: Recent Labs  Lab 09/30/21 0152 10/01/21 0455 10/03/21 0426  NA 135 139 139  K 4.1 4.2 3.6  CL 98 101 103  CO2 25 28 27   GLUCOSE 224* 145* 137*  BUN 16 11 7   CREATININE 0.73 0.65 0.60  CALCIUM 10.1 8.6* 8.5*   GFR: Estimated Creatinine Clearance: 111 mL/min (by C-G formula based on SCr of 0.6 mg/dL). Liver Function Tests: Recent Labs  Lab 09/30/21 0152 10/01/21 0455  AST 27 18  ALT 34 23  ALKPHOS 79 62  BILITOT 0.7 0.5  PROT 8.4* 7.0  ALBUMIN 4.6 3.6   Recent Labs  Lab 09/30/21 0152  LIPASE 46   No results for input(s): AMMONIA in the last 168 hours. Coagulation Profile: No results for input(s): INR, PROTIME in the last 168 hours. Cardiac Enzymes: No results for input(s): CKTOTAL, CKMB, CKMBINDEX, TROPONINI in the last 168 hours. BNP (last 3 results) Recent Labs    02/02/21 0923  PROBNP 11   HbA1C: Recent Labs    10/01/21 0455  HGBA1C 7.1*   CBG: Recent Labs  Lab 10/02/21 0750 10/02/21 1131 10/02/21 1648 10/02/21 2143 10/03/21 0748   GLUCAP 116* 136* 140* 141* 134*   Lipid Profile: No results for input(s): CHOL, HDL, LDLCALC, TRIG, CHOLHDL, LDLDIRECT in the last 72 hours. Thyroid Function Tests: No results for input(s): TSH, T4TOTAL, FREET4, T3FREE, THYROIDAB in the last 72 hours. Anemia Panel: No results for input(s): VITAMINB12, FOLATE, FERRITIN, TIBC, IRON, RETICCTPCT in the last 72 hours. Sepsis Labs: No results for input(s): PROCALCITON, LATICACIDVEN in the last 168 hours.  Recent Results (from the past 240 hour(s))  Resp Panel by RT-PCR (Flu A&B, Covid) Nasopharyngeal Swab     Status: None   Collection Time: 09/30/21  4:08 AM   Specimen: Nasopharyngeal Swab; Nasopharyngeal(NP) swabs in vial transport medium  Result Value Ref Range Status   SARS Coronavirus 2 by RT PCR NEGATIVE NEGATIVE Final    Comment: (NOTE) SARS-CoV-2 target nucleic acids are NOT DETECTED.  The SARS-CoV-2 RNA is generally detectable in upper respiratory specimens during the acute phase of infection. The lowest concentration of SARS-CoV-2 viral copies this assay can detect is 138 copies/mL. A negative result does not preclude SARS-Cov-2 infection and should not be used as the sole basis for treatment or other patient management decisions. A negative result may occur with  improper specimen collection/handling, submission of specimen other than nasopharyngeal swab, presence of viral mutation(s) within the areas targeted by this assay, and inadequate number of viral copies(<138 copies/mL). A negative result must be combined with clinical observations, patient history, and epidemiological information. The expected result is Negative.  Fact Sheet for Patients:  EntrepreneurPulse.com.au  Fact Sheet for Healthcare Providers:  IncredibleEmployment.be  This test is no t yet approved or cleared by the [place] FDA and  has been authorized for detection and/or diagnosis of SARS-CoV-2 by FDA under an  Emergency Use Authorization (EUA). This EUA will remain  in effect (meaning this test can be used) for the duration of the COVID-19 declaration under Section 564(b)(1) of the Act, 21 U.S.C.section 360bbb-3(b)(1), unless the authorization is terminated  or revoked sooner.       Influenza A by PCR NEGATIVE NEGATIVE Final   Influenza B by PCR NEGATIVE NEGATIVE Final    Comment: (NOTE) The Xpert Xpress SARS-CoV-2/FLU/RSV plus assay is intended as an aid in the diagnosis of influenza from Nasopharyngeal swab specimens and should not be used as a sole basis for treatment. Nasal washings and aspirates are unacceptable for Xpert Xpress SARS-CoV-2/FLU/RSV testing.  Fact Sheet for Patients: EntrepreneurPulse.com.au  Fact Sheet for Healthcare Providers: IncredibleEmployment.be  This test is not yet approved or cleared by the [place] FDA and has been authorized for detection and/or diagnosis of SARS-CoV-2 by FDA under an Emergency Use Authorization (EUA). This EUA will remain in effect (meaning this test can be used) for the duration of the COVID-19 declaration under Section 564(b)(1) of the Act, 21 U.S.C. section 360bbb-3(b)(1), unless the authorization is terminated or revoked.  Performed at [facility], [place] [address], [place], [place] [address]          Radiology Studies: No results found.      Scheduled Meds:  arformoterol  15 mcg Nebulization BID   And   umeclidinium bromide  1 puff Inhalation Daily   carvedilol  12.5 mg Oral BID   insulin aspart  0-15 Units Subcutaneous TID WC   montelukast  10 mg Oral QHS   sacubitril-valsartan  1 tablet Oral BID   spironolactone  25 mg Oral Daily   Continuous Infusions:     LOS: 3 days    Time spent: 38 minutes spent on chart review, discussion with nursing staff, consultants, updating family and interview/physical exam; more than 50% of that time was spent in  counseling and/or coordination of care.    Mayo Faulk J [place] DO [facility] Available via Epic secure chat 7am-7pm After these hours, please refer to coverage provider listed on [email or web address] 10/03/2021, 9:11 AM

## 2021-10-04 ENCOUNTER — Other Ambulatory Visit: Payer: Self-pay

## 2021-10-04 ENCOUNTER — Inpatient Hospital Stay (HOSPITAL_COMMUNITY): Payer: Medicare Other

## 2021-10-04 DIAGNOSIS — M79605 Pain in left leg: Secondary | ICD-10-CM | POA: Diagnosis not present

## 2021-10-04 DIAGNOSIS — M79604 Pain in right leg: Secondary | ICD-10-CM

## 2021-10-04 DIAGNOSIS — K56609 Unspecified intestinal obstruction, unspecified as to partial versus complete obstruction: Secondary | ICD-10-CM | POA: Diagnosis not present

## 2021-10-04 LAB — GLUCOSE, CAPILLARY
Glucose-Capillary: 128 mg/dL — ABNORMAL HIGH (ref 70–99)
Glucose-Capillary: 176 mg/dL — ABNORMAL HIGH (ref 70–99)
Glucose-Capillary: 148 mg/dL — ABNORMAL HIGH (ref 70–99)
Glucose-Capillary: 170 mg/dL — ABNORMAL HIGH (ref 70–99)

## 2021-10-04 MED ORDER — POLYETHYLENE GLYCOL 3350 17 G PO PACK
17.0000 g | PACK | Freq: Every day | ORAL | Status: DC
  Administered 2021-10-04 – 2021-10-05 (×2): 17 g via ORAL
  Filled 2021-10-04 (×3): qty 1

## 2021-10-04 MED ORDER — TRAMADOL HCL 50 MG PO TABS
50.0000 mg | ORAL_TABLET | Freq: Four times a day (QID) | ORAL | 0 refills | Status: DC | PRN
  Filled 2021-10-04: qty 30, 4d supply, fill #0

## 2021-10-04 MED ORDER — ACETAMINOPHEN 325 MG PO TABS: 650.0000 mg | ORAL_TABLET | Freq: Four times a day (QID) | ORAL | Status: DC | PRN

## 2021-10-04 MED ORDER — ONDANSETRON 4 MG PO TBDP
4.0000 mg | ORAL_TABLET | Freq: Once | ORAL | Status: AC
  Administered 2021-10-04: 4 mg via ORAL
  Filled 2021-10-04: qty 1

## 2021-10-04 MED ORDER — GLUCERNA SHAKE PO LIQD
237.0000 mL | Freq: Three times a day (TID) | ORAL | Status: DC
  Administered 2021-10-04 – 2021-10-06 (×3): 237 mL via ORAL
  Filled 2021-10-04 (×9): qty 237

## 2021-10-04 MED ORDER — METOCLOPRAMIDE HCL 5 MG/ML IJ SOLN: 5.0000 mg | Freq: Four times a day (QID) | INTRAMUSCULAR | Status: DC | PRN

## 2021-10-04 MED ORDER — DOCUSATE SODIUM 100 MG PO CAPS
100.0000 mg | ORAL_CAPSULE | Freq: Two times a day (BID) | ORAL | Status: DC
  Administered 2021-10-04 – 2021-10-06 (×5): 100 mg via ORAL
  Filled 2021-10-04 (×5): qty 1

## 2021-10-04 MED ORDER — FUROSEMIDE 40 MG PO TABS
40.0000 mg | ORAL_TABLET | Freq: Two times a day (BID) | ORAL | Status: DC
  Administered 2021-10-04 – 2021-10-06 (×4): 40 mg via ORAL
  Filled 2021-10-04 (×4): qty 1

## 2021-10-04 MED ORDER — TRAMADOL HCL 50 MG PO TABS
50.0000 mg | ORAL_TABLET | Freq: Four times a day (QID) | ORAL | Status: DC | PRN
  Administered 2021-10-04: 100 mg via ORAL
  Administered 2021-10-04: 50 mg via ORAL
  Administered 2021-10-05 – 2021-10-06 (×3): 100 mg via ORAL
  Filled 2021-10-04: qty 1
  Filled 2021-10-04 (×4): qty 2

## 2021-10-04 MED ORDER — ONDANSETRON HCL 4 MG/2ML IJ SOLN
4.0000 mg | Freq: Four times a day (QID) | INTRAMUSCULAR | Status: DC | PRN
  Administered 2021-10-04 – 2021-10-05 (×2): 4 mg via INTRAVENOUS
  Filled 2021-10-04 (×2): qty 2

## 2021-10-04 NOTE — Progress Notes (Signed)
Physical Therapy Treatment Patient Details Name: Sharon Swanson MRN: [id number] DOB: 1960-10-04 Today's Date: 10/04/2021   History of Present Illness 61 year old female PMH: significant for type 2 diabetes mellitus, hyperlipidemia, essential hypertension, COPD on 3 L nasal cannula at baseline, chronic diastolic congestive heart failure, morbid obesity admitted with c/o abdominal pain associated with nausea/vomiting.   CT abdomen/pelvis notable for incarcerated abdominal umbilical hernia with small bowel obstruction.  General surgery was consulted and patient underwent open umbilical hernia repair with mesh by Dr. Zenia Resides on 09/30/2021.    PT Comments    Pt is progressing with mobility. Pt is currently mod indep in the room with rollator and O2. Pt continues to report general weakness and decreased activity tolerance. Pt ambulated 200 ft with supervision with rollator. Pt required several standing rest breaks. Educated pt on standing LE there ex HEP using rollator for support. Pt verbalized understanding. Pt will continue to benefit from acute skilled PT to maximize mobility and independence until d/c home. PT upgraded therapy goals.  Recommendations for follow up therapy are one component of a multi-disciplinary discharge planning process, led by the attending physician.  Recommendations may be updated based on patient status, additional functional criteria and insurance authorization.  Follow Up Recommendations  No PT follow up     Assistance Recommended at Discharge Intermittent Supervision/Assistance  Patient can return home with the following Assistance with cooking/housework;Help with stairs or ramp for entrance   Equipment Recommendations  None recommended by PT    Recommendations for Other Services       Precautions / Restrictions Precautions Precaution Comments: abd binder,abdominal incision Restrictions Weight Bearing Restrictions: No     Mobility  Bed Mobility Overal bed  mobility: Modified Independent             General bed mobility comments: pt OOB in BR upon entry, Pt is mod Indep in the room with RW and O2    Transfers Overall transfer level: Modified independent Equipment used: Rolling walker (2 wheels) Transfers: Sit to/from Stand Sit to Stand: Modified independent (Device/Increase time)                Ambulation/Gait Ambulation/Gait assistance: Supervision Gait Distance (Feet): 200 Feet Assistive device: Rollator (4 wheels) Gait Pattern/deviations: Step-through pattern;Wide base of support Gait velocity: decr     General Gait Details: O2 used, slow but steady gait with multiple standing rest breaks   Stairs             Wheelchair Mobility    Modified Rankin (Stroke Patients Only)       Balance Overall balance assessment: No apparent balance deficits (not formally assessed)                                          Cognition Arousal/Alertness: Awake/alert Behavior During Therapy: WFL for tasks assessed/performed Overall Cognitive Status: Within Functional Limits for tasks assessed                                          Exercises      General Comments        Pertinent Vitals/Pain Pain Assessment: 0-10 Pain Score: 2  Breathing: normal Pain Location: abd with movement and coughing Pain Descriptors / Indicators: Discomfort;Grimacing;Burning Pain Intervention(s): Limited activity within patient's tolerance;Repositioned;Monitored during  session    Home Living                          Prior Function            PT Goals (current goals can now be found in the care plan section) Progress towards PT goals: Progressing toward goals    Frequency    Min 3X/week      PT Plan Current plan remains appropriate    Co-evaluation              AM-PAC PT "6 Clicks" Mobility   Outcome Measure  Help needed turning from your back to your side while in a  flat bed without using bedrails?: None Help needed moving from lying on your back to sitting on the side of a flat bed without using bedrails?: None Help needed moving to and from a bed to a chair (including a wheelchair)?: None Help needed standing up from a chair using your arms (e.g., wheelchair or bedside chair)?: None Help needed to walk in hospital room?: A Little Help needed climbing 3-5 steps with a railing? : A Lot 6 Click Score: 21    End of Session Equipment Utilized During Treatment: Other (comment) (Abd binder) Activity Tolerance: Patient tolerated treatment well Patient left: with call bell/phone within reach;in bed;with family/visitor present Nurse Communication: Mobility status PT Visit Diagnosis: Other abnormalities of gait and mobility (R26.89)     Time: 7588-3254 PT Time Calculation (min) (ACUTE ONLY): 14 min  Charges:  $Gait Training: 8-22 mins                      Theodoro Grist Kerstine 10/04/2021, 11:09 AM

## 2021-10-04 NOTE — Progress Notes (Signed)
PROGRESS NOTE    Sharon Swanson  NUU:[id number] DOB: 12/16/1960 DOA: 09/30/2021 PCP: Vevelyn Francois, NP    Brief Narrative:  Sharon Swanson is a 61 year old female with past medical history significant for type 2 diabetes mellitus, hyperlipidemia, essential hypertension, COPD on 3 L nasal cannula at baseline, chronic diastolic congestive heart failure, morbid obesity who presented to [facility] ED on 1/6 with complaints of abdominal pain associate with nausea/vomiting.  Patient with a history of a ventral hernia and was being followed by general surgery outpatient for eventual repair.  Night prior to hospitalization, patient developed significant abdominal pain with associated distention and nausea/vomiting.  Given progressive symptoms, patient presented to the ED for further evaluation.  In the ED, temperature 97.8 F, HR 98, RR 20, BP 166/75, SPO2 98% on 3 L nasal cannula.  Sodium 135, potassium 4.1, chloride 98, CO2 25, glucose 224, BUN 16, creatinine 0.73.  AST 27, ALT 34, total bilirubin 0.7.  Lipase 46.  WBC 9.6, hemoglobin 14.9, platelets 268.  COVID-19 PCR negative.  Influenza A/B PCR negative.  Urinalysis unrevealing.  CT abdomen/pelvis with contrast with umbilical hernia containing loops of small bowel with surrounding fat stranding and a small amount of free fluid, dilation of the small bowel loops proximal to the hernia sac compatible with small bowel obstruction.  General surgery was consulted.  TRH consulted for further evaluation and management of incarcerated hernia with small bowel obstruction.   Assessment & Plan:   Principal Problem:   SBO (small bowel obstruction) (HCC) Active Problems:   BMI 45.0-49.9, adult (HCC)   Incarcerated umbilical hernia   Pulmonary nodule, left   COPD (chronic obstructive pulmonary disease) (HCC)   Insulin-requiring or dependent type II diabetes mellitus (HCC)   Essential hypertension   OSA (obstructive sleep apnea)   Chronic respiratory failure  (HCC)   Dependence on continuous supplemental oxygen   Incarcerated umbilical hernia Small bowel obstruction Patient presenting to the ED with progressive abdominal pain, distention associate with nausea and vomiting.  CT abdomen/pelvis notable for incarcerated abdominal umbilical hernia with small bowel obstruction.  General surgery was consulted and patient underwent open umbilical hernia repair with mesh by Dr. Zenia Resides on 09/30/2021. --Soft diet --Antiemetics as needed --Repeat abdominal x-ray in a.m. --Further per general surgery  Type 2 diabetes mellitus Home regimen includes glipizide 10 mg p.o. twice daily, Lantus 30 units subcutaneously nightly, metformin 1000 mg p.o. twice daily --Holding home regimen --moderate SSI for coverage --CBGs qAC/HS   Essential hypertension Chronic diastolic congestive heart failure, compensated Home regimen includes carvedilol 12.5 mg p.o. twice daily, Entresto 97-103 mg p.o. twice daily, furosemide 40 mg p.o. twice daily, spironolactone 25 mg p.o. daily.  Recent TTE 02/24/2021 with LVEF 60 to 65%, mild concentric LVH, LA mildly dilated, mild MR. --Carvedilol 12.5 mg p.o. twice daily --Entresto 97-103 mg p.o. twice daily --spironolactone 25 mg p.o. daily today --furosemide 40 mg p.o. twice daily.   --Hydralazine 25mg  PO q8h PRN SBP >170 or DBP >110 --Strict I's and O's and daily weights  Chronic hypoxic respiratory failure COPD Home regimen includes Singulair, Stiolto Respimat.  Follows with pulmonology outpatient, Dr. Vaughan Browner. --Garlon Hatchet neb twice daily and Incruse Ellipta 1 puff daily as hospital substitution --Continue supplemental oxygen, maintain SPO2 > 88%, on 3 L nasal cannula at baseline --Duo neb q6h PRN shortness of breath/wheezing  Lower extremity edema, chronic Reports right lower extremity worse than left.  Vascular duplex ultrasound 1/10 negative for DVT. --Resuming Lasix as above  Weakness/debility: Patient  reports ambulatory at  baseline with the use of a walker.  Seen by PT on 1/7 with no follow-up recommended.  Morbid obesity Body mass index is 49.24 kg/m.  Discussed with patient needs for aggressive lifestyle changes/weight loss as this complicates all facets of care.  Outpatient follow-up with PCP.  May benefit from bariatric evaluation outpatient.    DVT prophylaxis: enoxaparin (LOVENOX) injection 40 mg Start: 10/03/21 1800 SCDs Start: 09/30/21 1610   Code Status: Full Code Family Communication: No family present at bedside this morning  Disposition Plan:  Level of care: Med-Surg Status is: Inpatient  Remains inpatient appropriate because: Continues with nausea and vomiting despite advancement of diet by general surgery, hopeful for discharge home in 1-2 days.    Consultants:  General surgery  Procedures:  Open umbilical hernia repair with mesh, Dr. Zenia Resides 09/30/2021  Antimicrobials:  Perioperative cefazolin   Subjective: Patient seen examined at bedside, resting comfortably.  Sitting at edge of bed.  Reports vomited omelette this morning and RN reported that patient vomited 15 minutes after having lunch this afternoon.  Patient continues with nausea.  Also reports lower extremity edema, ultrasound this morning negative for DVT.  No other specific complaints or concerns at this time. Denies headache, no visual changes, no chest pain, no palpitations, no shortness of breath more than her typical baseline, no cough/congestion currently, no fever/chills/night sweats, no nausea/vomiting/diarrhea, no paresthesias.  No acute events overnight per nursing staff.    Objective: Vitals:   10/04/21 0421 10/04/21 0723 10/04/21 1100 10/04/21 1328  BP: (!) 158/83   136/69  Pulse: 63  82 69  Resp: 16   14  Temp: (!) 97.5 F (36.4 C)   99.1 F (37.3 C)  TempSrc: Oral   Oral  SpO2: 100% 96% 98% 99%  Weight:      Height:        Intake/Output Summary (Last 24 hours) at 10/04/2021 1425 Last data filed at  10/04/2021 1300 Gross per 24 hour  Intake 1440 ml  Output 1800 ml  Net -360 ml   Filed Weights   09/30/21 0035 10/02/21 0622 10/03/21 0500  Weight: 136.1 kg (!) 144.6 kg (!) 142.6 kg    Examination:  General exam: Appears calm and comfortable, chronically ill appearance, morbidly obese, tearful Respiratory system: Clear to auscultation. Respiratory effort normal.  On 2L nasal cannula with SPO2 99% at rest; (baseline 3L) Cardiovascular system: S1 & S2 heard, RRR. No JVD, murmurs, rubs, gallops or clicks.  1+ bilateral pedal edema. Gastrointestinal system: Abdomen is nondistended, binder in place, soft and nontender. No organomegaly or masses felt.  + bowel sounds Central nervous system: Alert and oriented. No focal neurological deficits. Extremities: Symmetric 5 x 5 power. Skin: Skin tear noted to anterior left lower extremity shin, chronic skin changes bilateral lower extremities, otherwise no concerning lesions, erythema, fluctuance Psychiatry: Judgement and insight appear normal. Mood & affect appropriate.     Data Reviewed: I have personally reviewed following labs and imaging studies  CBC: Recent Labs  Lab 09/30/21 0152 10/01/21 0455 10/03/21 0426  WBC 9.6 9.7 5.7  NEUTROABS 8.0*  --   --   HGB 14.9 12.9 11.7*  HCT 47.4* 41.2 38.2  MCV 91.9 93.2 95.5  PLT 268 243 960   Basic Metabolic Panel: Recent Labs  Lab 09/30/21 0152 10/01/21 0455 10/03/21 0426  NA 135 139 139  K 4.1 4.2 3.6  CL 98 101 103  CO2 25 28 27   GLUCOSE 224* 145*  137*  BUN 16 11 7   CREATININE 0.73 0.65 0.60  CALCIUM 10.1 8.6* 8.5*   GFR: Estimated Creatinine Clearance: 111 mL/min (by C-G formula based on SCr of 0.6 mg/dL). Liver Function Tests: Recent Labs  Lab 09/30/21 0152 10/01/21 0455  AST 27 18  ALT 34 23  ALKPHOS 79 62  BILITOT 0.7 0.5  PROT 8.4* 7.0  ALBUMIN 4.6 3.6   Recent Labs  Lab 09/30/21 0152  LIPASE 46   No results for input(s): AMMONIA in the last 168  hours. Coagulation Profile: No results for input(s): INR, PROTIME in the last 168 hours. Cardiac Enzymes: No results for input(s): CKTOTAL, CKMB, CKMBINDEX, TROPONINI in the last 168 hours. BNP (last 3 results) Recent Labs    02/02/21 0923  PROBNP 11   HbA1C: No results for input(s): HGBA1C in the last 72 hours.  CBG: Recent Labs  Lab 10/03/21 1156 10/03/21 1642 10/03/21 2142 10/04/21 0743 10/04/21 1159  GLUCAP 147* 156* 170* 128* 176*   Lipid Profile: No results for input(s): CHOL, HDL, LDLCALC, TRIG, CHOLHDL, LDLDIRECT in the last 72 hours. Thyroid Function Tests: No results for input(s): TSH, T4TOTAL, FREET4, T3FREE, THYROIDAB in the last 72 hours. Anemia Panel: No results for input(s): VITAMINB12, FOLATE, FERRITIN, TIBC, IRON, RETICCTPCT in the last 72 hours. Sepsis Labs: No results for input(s): PROCALCITON, LATICACIDVEN in the last 168 hours.  Recent Results (from the past 240 hour(s))  Resp Panel by RT-PCR (Flu A&B, Covid) Nasopharyngeal Swab     Status: None   Collection Time: 09/30/21  4:08 AM   Specimen: Nasopharyngeal Swab; Nasopharyngeal(NP) swabs in vial transport medium  Result Value Ref Range Status   SARS Coronavirus 2 by RT PCR NEGATIVE NEGATIVE Final    Comment: (NOTE) SARS-CoV-2 target nucleic acids are NOT DETECTED.  The SARS-CoV-2 RNA is generally detectable in upper respiratory specimens during the acute phase of infection. The lowest concentration of SARS-CoV-2 viral copies this assay can detect is 138 copies/mL. A negative result does not preclude SARS-Cov-2 infection and should not be used as the sole basis for treatment or other patient management decisions. A negative result may occur with  improper specimen collection/handling, submission of specimen other than nasopharyngeal swab, presence of viral mutation(s) within the areas targeted by this assay, and inadequate number of viral copies(<138 copies/mL). A negative result must be  combined with clinical observations, patient history, and epidemiological information. The expected result is Negative.  Fact Sheet for Patients:  EntrepreneurPulse.com.au  Fact Sheet for Healthcare Providers:  IncredibleEmployment.be  This test is no t yet approved or cleared by the [place] FDA and  has been authorized for detection and/or diagnosis of SARS-CoV-2 by FDA under an Emergency Use Authorization (EUA). This EUA will remain  in effect (meaning this test can be used) for the duration of the COVID-19 declaration under Section 564(b)(1) of the Act, 21 U.S.C.section 360bbb-3(b)(1), unless the authorization is terminated  or revoked sooner.       Influenza A by PCR NEGATIVE NEGATIVE Final   Influenza B by PCR NEGATIVE NEGATIVE Final    Comment: (NOTE) The Xpert Xpress SARS-CoV-2/FLU/RSV plus assay is intended as an aid in the diagnosis of influenza from Nasopharyngeal swab specimens and should not be used as a sole basis for treatment. Nasal washings and aspirates are unacceptable for Xpert Xpress SARS-CoV-2/FLU/RSV testing.  Fact Sheet for Patients: EntrepreneurPulse.com.au  Fact Sheet for Healthcare Providers: IncredibleEmployment.be  This test is not yet approved or cleared by the [place]  FDA and has been authorized for detection and/or diagnosis of SARS-CoV-2 by FDA under an Emergency Use Authorization (EUA). This EUA will remain in effect (meaning this test can be used) for the duration of the COVID-19 declaration under Section 564(b)(1) of the Act, 21 U.S.C. section 360bbb-3(b)(1), unless the authorization is terminated or revoked.  Performed at [facility], [place] [address] [place], [place] [address]          Radiology Studies: VAS [place] LOWER EXTREMITY VENOUS (DVT)  Result Date: 10/04/2021  Lower Venous DVT Study Patient Name:  STELLAR GENSEL  Date of  Exam:   10/04/2021 Medical Rec #: [id number]       Accession #:    [id number] Date of Birth: 1961/03/27       Patient Gender: F Patient Age:   41 years Exam Location:  [facility] Procedure:      VAS [place] LOWER EXTREMITY VENOUS (DVT) Referring Phys: Barkley Boards --------------------------------------------------------------------------------  Indications: Pain.  Limitations: Body habitus, poor ultrasound/tissue interface and patient pain intolerance. Comparison Study: Previous exam on 08/03/2019 was negative for DVT. Performing Technologist: Rogelia Rohrer RVT, RDMS  Examination Guidelines: A complete evaluation includes B-mode imaging, spectral Doppler, color Doppler, and power Doppler as needed of all accessible portions of each vessel. Bilateral testing is considered an integral part of a complete examination. Limited examinations for reoccurring indications may be performed as noted. The reflux portion of the exam is performed with the patient in reverse Trendelenburg.  +---------+---------------+---------+-----------+----------+-------------------+  RIGHT     Compressibility Phasicity Spontaneity Properties Thrombus Aging       +---------+---------------+---------+-----------+----------+-------------------+  CFV       Full            Yes       Yes                                         +---------+---------------+---------+-----------+----------+-------------------+  SFJ       Full                                                                  +---------+---------------+---------+-----------+----------+-------------------+  FV Prox   Full            Yes       Yes                                         +---------+---------------+---------+-----------+----------+-------------------+  FV Mid    Full            Yes       Yes                                         +---------+---------------+---------+-----------+----------+-------------------+  FV Distal Full            Yes       Yes                                          +---------+---------------+---------+-----------+----------+-------------------+  PFV       Full                                                                  +---------+---------------+---------+-----------+----------+-------------------+  POP       Full            Yes       Yes                                         +---------+---------------+---------+-----------+----------+-------------------+  PTV       Full                                                                  +---------+---------------+---------+-----------+----------+-------------------+  PERO      Full                                             Not well visualized  +---------+---------------+---------+-----------+----------+-------------------+   +--------+---------------+---------+-----------+----------+--------------------+  LEFT     Compressibility Phasicity Spontaneity Properties Thrombus Aging        +--------+---------------+---------+-----------+----------+--------------------+  CFV      Full            Yes       Yes                                          +--------+---------------+---------+-----------+----------+--------------------+  SFJ      Full                                                                   +--------+---------------+---------+-----------+----------+--------------------+  FV Prox  Full            Yes       Yes                                          +--------+---------------+---------+-----------+----------+--------------------+  FV Mid   Full            Yes       Yes                                          +--------+---------------+---------+-----------+----------+--------------------+  FV                       Yes  Yes                    patent by              Distal                                                    color/doppler         +--------+---------------+---------+-----------+----------+--------------------+  PFV      Full                                                                    +--------+---------------+---------+-----------+----------+--------------------+  POP      Full            Yes       Yes                                          +--------+---------------+---------+-----------+----------+--------------------+  PTV      Full                                             Not well visualized   +--------+---------------+---------+-----------+----------+--------------------+  PERO     Full                                             Not well visualized   +--------+---------------+---------+-----------+----------+--------------------+    Summary: BILATERAL: - No evidence of deep vein thrombosis seen in the lower extremities, bilaterally. - No evidence of superficial venous thrombosis in the lower extremities, bilaterally. -No evidence of popliteal cyst, bilaterally.   *See table(s) above for measurements and observations.    Preliminary         Scheduled Meds:  acetaminophen  650 mg Oral Q6H   arformoterol  15 mcg Nebulization BID   And   umeclidinium bromide  1 puff Inhalation Daily   carvedilol  12.5 mg Oral BID   docusate sodium  100 mg Oral BID   enoxaparin (LOVENOX) injection  40 mg Subcutaneous Q12H   feeding supplement (GLUCERNA SHAKE)  237 mL Oral TID BM   insulin aspart  0-15 Units Subcutaneous TID WC   montelukast  10 mg Oral QHS   pantoprazole  40 mg Oral Daily   polyethylene glycol  17 g Oral Daily   sacubitril-valsartan  1 tablet Oral BID   spironolactone  25 mg Oral Daily   Continuous Infusions:     LOS: 4 days    Time spent: 38 minutes spent on chart review, discussion with nursing staff, consultants, updating family and interview/physical exam; more than 50% of that time was spent in counseling and/or coordination of care.    Carrina Schoenberger J [place] DO [facility] Available via Epic secure chat 7am-7pm After these hours, please refer to  coverage provider listed on [email or web address] 10/04/2021, 2:25 PM

## 2021-10-04 NOTE — Progress Notes (Signed)
Patient vomited once after her lunch.Notified MD Randall Hiss.

## 2021-10-04 NOTE — Progress Notes (Signed)
Progress Note  4 Days Post-Op  Subjective: Pt reported some n/v with soft diet but also reported that she may have overeaten a little bit. She is passing flatus and reports she had a small BM. She also thinks the oxycodone may have made her feel nauseated. She ambulated 2x yesterday. Reported some numbness in BLE but also reported that she has had this before secondary to arthritis in her back.   Objective: Vital signs in last 24 hours: Temp:  [97.5 F (36.4 C)-98.3 F (36.8 C)] 97.5 F (36.4 C) (01/10 0421) Pulse Rate:  [63-78] 63 (01/10 0421) Resp:  [16-18] 16 (01/10 0421) BP: (148-158)/(78-89) 158/83 (01/10 0421) SpO2:  [94 %-100 %] 96 % (01/10 0723) Last BM Date: 10/03/21  Intake/Output from previous day: 01/09 0701 - 01/10 0700 In: 1560 [P.O.:1560] Out: 1800 [Urine:1800] Intake/Output this shift: No intake/output data recorded.  PE: General: pleasant, WD, morbidly obese female who is up in chair in NAD Heart: regular, rate, and rhythm.   Lungs: Respiratory effort nonlabored Abd: soft, appropriately ttp, incision with steri-strips present and no signs of infection, binder present  Ext: mild edema of BLE, negative Homan's sign bilaterally Psych: A&Ox3 with an appropriate affect.    Lab Results:  Recent Labs    10/03/21 0426  WBC 5.7  HGB 11.7*  HCT 38.2  PLT 221   BMET Recent Labs    10/03/21 0426  NA 139  K 3.6  CL 103  CO2 27  GLUCOSE 137*  BUN 7  CREATININE 0.60  CALCIUM 8.5*   PT/INR No results for input(s): LABPROT, INR in the last 72 hours. CMP     Component Value Date/Time   NA 139 10/03/2021 0426   NA 142 02/02/2021 0923   K 3.6 10/03/2021 0426   CL 103 10/03/2021 0426   CO2 27 10/03/2021 0426   GLUCOSE 137 (H) 10/03/2021 0426   BUN 7 10/03/2021 0426   BUN 14 02/02/2021 0923   CREATININE 0.60 10/03/2021 0426   CALCIUM 8.5 (L) 10/03/2021 0426   PROT 7.0 10/01/2021 0455   PROT 7.0 02/02/2021 0923   ALBUMIN 3.6 10/01/2021 0455    ALBUMIN 4.3 02/02/2021 0923   AST 18 10/01/2021 0455   ALT 23 10/01/2021 0455   ALKPHOS 62 10/01/2021 0455   BILITOT 0.5 10/01/2021 0455   BILITOT 0.3 02/02/2021 0923   GFRNONAA >60 10/03/2021 0426   GFRAA 111 10/04/2020 1019   Lipase     Component Value Date/Time   LIPASE 46 09/30/2021 0152       Studies/Results: No results found.  Anti-infectives: Anti-infectives (From admission, onward)    Start     Dose/Rate Route Frequency Ordered Stop   09/30/21 1130  ceFAZolin (ANCEF) IVPB 3g/100 mL premix        3 g 200 mL/hr over 30 Minutes Intravenous On call to O.R. 09/30/21 0836 09/30/21 1205        Assessment/Plan POD4 s/p umbilical hernia repair with mesh - Dr. Michaelle Birks - some n/v on soft diet but still having some bowel function and pt thinks she may have overeaten - encouraged smaller portions - change PO oxy IR to PO tramadol as patient also felt this contributed to  - continue binder when OOB - continue mobilization  - stable for discharge from a surgical standpoint when tolerating soft diet    FEN: soft diet, glucerna VTE: LMWH ID: [id number] pre-op    COPD on home oxygen HTN Chronic diastolic CHF [id number]  Morbid obesity - BMI 49.24 Leg swelling and numbness - suspect this is more than likely secondary to decreased mobility and arthritis but will order duplex to r/o DVT Chronic arthritis of low back   LOS: 4 days   I reviewed hospitalist notes, last 24 h vitals and pain scores, and last 48 h intake and output.  This care required low level of medical decision making.    Norm Parcel, [facility] 10/04/2021, 8:22 AM Please see Amion for pager number during day hours 7:00am-4:30pm

## 2021-10-04 NOTE — Progress Notes (Signed)
BLE venous duplex has been completed.   Results can be found under chart review under CV PROC. 10/04/2021 12:45 PM Baden Betsch RVT, RDMS

## 2021-10-05 ENCOUNTER — Inpatient Hospital Stay (HOSPITAL_COMMUNITY): Payer: Medicare Other

## 2021-10-05 DIAGNOSIS — I5032 Chronic diastolic (congestive) heart failure: Secondary | ICD-10-CM

## 2021-10-05 DIAGNOSIS — R531 Weakness: Secondary | ICD-10-CM

## 2021-10-05 DIAGNOSIS — R609 Edema, unspecified: Secondary | ICD-10-CM

## 2021-10-05 LAB — COMPREHENSIVE METABOLIC PANEL
ALT: 36 U/L (ref 0–44)
AST: 35 U/L (ref 15–41)
Albumin: 3.2 g/dL — ABNORMAL LOW (ref 3.5–5.0)
Alkaline Phosphatase: 59 U/L (ref 38–126)
Anion gap: 8 (ref 5–15)
BUN: 8 mg/dL (ref 6–20)
CO2: 28 mmol/L (ref 22–32)
Calcium: 8.6 mg/dL — ABNORMAL LOW (ref 8.9–10.3)
Chloride: 100 mmol/L (ref 98–111)
Creatinine, Ser: 0.67 mg/dL (ref 0.44–1.00)
GFR, Estimated: >60 mL/min (ref >60)
Glucose, Bld: 128 mg/dL — ABNORMAL HIGH (ref 70–99)
Potassium: 4 mmol/L (ref 3.5–5.1)
Sodium: 136 mmol/L (ref 135–145)
Total Bilirubin: 0.7 mg/dL (ref 0.3–1.2)
Total Protein: 6.5 g/dL (ref 6.5–8.1)

## 2021-10-05 LAB — CBC
HCT: 36.7 % (ref 36.0–46.0)
Hemoglobin: 11.3 g/dL — ABNORMAL LOW (ref 12.0–15.0)
MCH: 28.5 pg (ref 26.0–34.0)
MCHC: 30.8 g/dL (ref 30.0–36.0)
MCV: 92.4 fL (ref 80.0–100.0)
Platelets: 222 10*3/uL (ref 150–400)
RBC: 3.97 MIL/uL (ref 3.87–5.11)
RDW: 13.3 % (ref 11.5–15.5)
WBC: 3.3 10*3/uL — ABNORMAL LOW (ref 4.0–10.5)
nRBC: 0 % (ref 0.0–0.2)

## 2021-10-05 LAB — MAGNESIUM: Magnesium: 1.7 mg/dL (ref 1.7–2.4)

## 2021-10-05 LAB — GLUCOSE, CAPILLARY
Glucose-Capillary: 189 mg/dL — ABNORMAL HIGH (ref 70–99)
Glucose-Capillary: 126 mg/dL — ABNORMAL HIGH (ref 70–99)
Glucose-Capillary: 175 mg/dL — ABNORMAL HIGH (ref 70–99)
Glucose-Capillary: 138 mg/dL — ABNORMAL HIGH (ref 70–99)

## 2021-10-05 MED ORDER — BISACODYL 10 MG RE SUPP
10.0000 mg | Freq: Once | RECTAL | Status: AC
  Administered 2021-10-05: 10 mg via RECTAL
  Filled 2021-10-05: qty 1

## 2021-10-05 NOTE — Assessment & Plan Note (Addendum)
On 3 L chronically at baseline Singuilar, stiolto at home Follows with Dr. Vaughan Browner outpatient

## 2021-10-05 NOTE — Assessment & Plan Note (Addendum)
On glipizide 10 mg BID, lantus 30 units nightly, novolog 20 units TID with meals, metformin 1000 mg PO BID at home  Hasn't needed much insulin here - stop glipizide.  Cut lantus in half to 15 units nightly at discharge, change novolog to sliding scale.  Follow with PCP outpatient for additional recommendations as needed.

## 2021-10-05 NOTE — Assessment & Plan Note (Signed)
Echo 02/2021 with EF 24-58%, LV diastolic parameters indeterminate Lasix, coreg, entresto, spironolactone

## 2021-10-05 NOTE — Progress Notes (Signed)
Physical Therapy Treatment and Discharge from Acute PT Patient Details Name: Sharon Swanson MRN: [id number] DOB: Jan 06, 1961 Today's Date: 10/05/2021   History of Present Illness 61 year old female PMH: significant for type 2 diabetes mellitus, hyperlipidemia, essential hypertension, COPD on 3 L nasal cannula at baseline, chronic diastolic congestive heart failure, morbid obesity admitted with c/o abdominal pain associated with nausea/vomiting.   CT abdomen/pelvis notable for incarcerated abdominal umbilical hernia with small bowel obstruction.  General surgery was consulted and patient underwent open umbilical hernia repair with mesh by Dr. Zenia Resides on 09/30/2021.    PT Comments    Pt ambulated in hallway with rollator and reports pain controlled.  Pt denies symptoms this afternoon.  Pt would benefit from ambulating with nursing staff and/or mobility specialist during remainder of hospitalization (will need her baseline 3L O2 [place]).  Since pt has met acute PT goals, PT to sign off at this time.   Recommendations for follow up therapy are one component of a multi-disciplinary discharge planning process, led by the attending physician.  Recommendations may be updated based on patient status, additional functional criteria and insurance authorization.  Follow Up Recommendations  No PT follow up     Assistance Recommended at Discharge Intermittent Supervision/Assistance  Patient can return home with the following Assistance with cooking/housework;Help with stairs or ramp for entrance   Equipment Recommendations  None recommended by PT    Recommendations for Other Services       Precautions / Restrictions Precautions Precaution Comments: abd binder,abdominal incision     Mobility  Bed Mobility Overal bed mobility: Modified Independent                  Transfers Overall transfer level: Modified independent                      Ambulation/Gait Ambulation/Gait assistance:  Supervision;Modified independent (Device/Increase time) Gait Distance (Feet): 280 Feet Assistive device: Rollator (4 wheels) Gait Pattern/deviations: WFL(Within Functional Limits)       General Gait Details: pt with slow but steady gait with rollator, took 2 standing rest breaks due to fatigue, remained on baseline 3L O2 [place]   Stairs             Wheelchair Mobility    Modified Rankin (Stroke Patients Only)       Balance                                            Cognition Arousal/Alertness: Awake/alert Behavior During Therapy: WFL for tasks assessed/performed Overall Cognitive Status: Within Functional Limits for tasks assessed                                          Exercises      General Comments        Pertinent Vitals/Pain Pain Assessment: No/denies pain Pain Intervention(s): Monitored during session    Home Living                          Prior Function            PT Goals (current goals can now be found in the care plan section) Progress towards PT goals: Goals met/education completed, patient discharged from PT  Frequency    Min 3X/week      PT Plan Current plan remains appropriate    Co-evaluation              AM-PAC PT "6 Clicks" Mobility   Outcome Measure  Help needed turning from your back to your side while in a flat bed without using bedrails?: None Help needed moving from lying on your back to sitting on the side of a flat bed without using bedrails?: None Help needed moving to and from a bed to a chair (including a wheelchair)?: None Help needed standing up from a chair using your arms (e.g., wheelchair or bedside chair)?: None Help needed to walk in hospital room?: A Little Help needed climbing 3-5 steps with a railing? : A Little 6 Click Score: 22    End of Session Equipment Utilized During Treatment: Oxygen Activity Tolerance: Patient tolerated treatment  well Patient left: in bed;with call bell/phone within reach   PT Visit Diagnosis: Other abnormalities of gait and mobility (R26.89)     Time: 2831-5176 PT Time Calculation (min) (ACUTE ONLY): 13 min  Charges:  $Gait Training: 8-22 mins                     Arlyce Dice, DPT Acute Rehabilitation Services Pager: [phone] Office: [place] 10/05/2021, 4:26 PM

## 2021-10-05 NOTE — Progress Notes (Signed)
Pt ate large breakfast and tolerated well for about an hour. Pt got up to use toilet and started wreching at the toilet. Pt stated she "threw up her whole breakfast" I was standing in the room and never actually saw any emesis.

## 2021-10-05 NOTE — Assessment & Plan Note (Signed)
Continue lasix [place] negative for DVT

## 2021-10-05 NOTE — Progress Notes (Signed)
PROGRESS NOTE    Sharon Swanson  [id number]:[id number] DOB: 1960-11-02 DOA: 09/30/2021 PCP: Vevelyn Francois, NP  Chief Complaint  Patient presents with   Abdominal Pain    Brief Narrative:  Sharon Swanson is Sharon Swanson 61 year old female with past medical history significant for type 2 diabetes mellitus, hyperlipidemia, essential hypertension, COPD on 3 L nasal cannula at baseline, chronic diastolic congestive heart failure, morbid obesity who presented to [facility] ED on 1/6 with complaints of abdominal pain associate with nausea/vomiting.  Patient with Sharon Swanson history of Zanaiya Swanson ventral hernia and was being followed by general surgery outpatient for eventual repair.  She was found to have incarcerated umbilical hernia and small bowel obstruction.  She underwent open umbilical hernia repair with mesh on 1/6.  At this time, gradually advancing diet as tolerated.         Assessment & Plan:   Principal Problem:   SBO (small bowel obstruction) (HCC) Active Problems:   Incarcerated umbilical hernia   Insulin-requiring or dependent type II diabetes mellitus (HCC)   Essential hypertension   Chronic diastolic CHF (congestive heart failure) (HCC)   COPD (chronic obstructive pulmonary disease) (HCC)   Chronic respiratory failure (HCC)   Edema   Weakness   BMI 45.0-49.9, adult (HCC)   Pulmonary nodule, left   OSA (obstructive sleep apnea)   Dependence on continuous supplemental oxygen   * SBO (small bowel obstruction) ([place])- (present on admission) 1/6 CT with umbilical hernia containing loops of small bowel with surrounding fat stranding and small amount of free fluid, dilatation of small bowel loops proximal to hernia sac c/w SBO S/p open umbilical hernia repair with mesh underly on 1/6 by Dr. Zenia Resides Currently on soft diet per surgery, ok for d/c when she tolerates this Unfortunately, continues to have N/V Appreciate continued surgery recs Binder when OOB, mobilization, smaller portions  Insulin-requiring or dependent  type II diabetes mellitus (HCC) On glipizide 10 mg BID, lantus 30 units nightly, metformin 1000 mg PO BID at home Currently only on SSI, trend  Essential hypertension- (present on admission) lasix, coreg, entresto, spironolactone  Chronic diastolic CHF (congestive heart failure) ([place]) Echo 02/2021 with EF 62-69%, LV diastolic parameters indeterminate Lasix, coreg, entresto, spironolactone  COPD (chronic obstructive pulmonary disease) ([place])- (present on admission) On 3 L chronically at baseline Singuilar, stiolto at home brovana and incruse as hospital sub Follows with Dr. Vaughan Browner outpatient  Edema Continue lasix [place] negative for DVT  Weakness PT not recommending follow up   DVT prophylaxis: lovenox Code Status: full Family Communication: none at bedside Disposition:   Status is: Inpatient  Remains inpatient appropriate because: continued N/V       Consultants:  surgery  Procedures:  Open umbilical hernia repair with mesh underly  Antimicrobials:  Anti-infectives (From admission, onward)    Start     Dose/Rate Route Frequency Ordered Stop   09/30/21 1130  ceFAZolin (ANCEF) IVPB 3g/100 mL premix        3 g 200 mL/hr over 30 Minutes Intravenous On call to O.R. 09/30/21 0836 09/30/21 1205       Subjective: No new complaints Emesis this AM after breakfast reported  Objective: Vitals:   10/04/21 1328 10/04/21 2116 10/05/21 0520 10/05/21 0759  BP: 136/69 (!) 154/70 (!) 144/83   Pulse: 69 71 63   Resp: 14 16 16    Temp: 99.1 F (37.3 C) 98.1 F (36.7 C) 98.2 F (36.8 C)   TempSrc: Oral Oral Oral   SpO2: 99% 92% 99% 96%  Weight:      Height:        Intake/Output Summary (Last 24 hours) at 10/05/2021 1411 Last data filed at 10/05/2021 1155 Gross per 24 hour  Intake 1480 ml  Output 750 ml  Net 730 ml   Filed Weights   09/30/21 0035 10/02/21 0622 10/03/21 0500  Weight: 136.1 kg (!) 144.6 kg (!) 142.6 kg    Examination:  General exam: Appears calm  and comfortable  Respiratory system: unlabored Cardiovascular system: RRR Gastrointestinal system: abd binder in place, nontender Central nervous system: Alert and oriented. No focal neurological deficits. Extremities: trace edema Skin: No rashes, lesions or ulcers Psychiatry: Judgement and insight appear normal. Mood & affect appropriate.     Data Reviewed: I have personally reviewed following labs and imaging studies  CBC: Recent Labs  Lab 09/30/21 0152 10/01/21 0455 10/03/21 0426 10/05/21 0404  WBC 9.6 9.7 5.7 3.3*  NEUTROABS 8.0*  --   --   --   HGB 14.9 12.9 11.7* 11.3*  HCT 47.4* 41.2 38.2 36.7  MCV 91.9 93.2 95.5 92.4  PLT 268 243 221 387    Basic Metabolic Panel: Recent Labs  Lab 09/30/21 0152 10/01/21 0455 10/03/21 0426 10/05/21 0404  NA 135 139 139 136  K 4.1 4.2 3.6 4.0  CL 98 101 103 100  CO2 25 28 27 28   GLUCOSE 224* 145* 137* 128*  BUN 16 11 7 8   CREATININE 0.73 0.65 0.60 0.67  CALCIUM 10.1 8.6* 8.5* 8.6*  MG  --   --   --  1.7    GFR: Estimated Creatinine Clearance: 111 mL/min (by C-G formula based on SCr of 0.67 mg/dL).  Liver Function Tests: Recent Labs  Lab 09/30/21 0152 10/01/21 0455 10/05/21 0404  AST 27 18 35  ALT 34 23 36  ALKPHOS 79 62 59  BILITOT 0.7 0.5 0.7  PROT 8.4* 7.0 6.5  ALBUMIN 4.6 3.6 3.2*    CBG: Recent Labs  Lab 10/04/21 1159 10/04/21 1739 10/04/21 2214 10/05/21 0715 10/05/21 1145  GLUCAP 176* 148* 170* 189* 126*     Recent Results (from the past 240 hour(s))  Resp Panel by RT-PCR (Flu Dugan Vanhoesen&B, Covid) Nasopharyngeal Swab     Status: None   Collection Time: 09/30/21  4:08 AM   Specimen: Nasopharyngeal Swab; Nasopharyngeal(NP) swabs in vial transport medium  Result Value Ref Range Status   SARS Coronavirus 2 by RT PCR NEGATIVE NEGATIVE Final    Comment: (NOTE) SARS-CoV-2 target nucleic acids are NOT DETECTED.  The SARS-CoV-2 RNA is generally detectable in upper respiratory specimens during the acute  phase of infection. The lowest concentration of SARS-CoV-2 viral copies this assay can detect is 138 copies/mL. Rosea Dory negative result does not preclude SARS-Cov-2 infection and should not be used as the sole basis for treatment or other patient management decisions. Jyron Turman negative result may occur with  improper specimen collection/handling, submission of specimen other than nasopharyngeal swab, presence of viral mutation(s) within the areas targeted by this assay, and inadequate number of viral copies(<138 copies/mL). Oden Lindaman negative result must be combined with clinical observations, patient history, and epidemiological information. The expected result is Negative.  Fact Sheet for Patients:  EntrepreneurPulse.com.au  Fact Sheet for Healthcare Providers:  IncredibleEmployment.be  This test is no t yet approved or cleared by the [place] FDA and  has been authorized for detection and/or diagnosis of SARS-CoV-2 by FDA under an Emergency Use Authorization (EUA). This EUA will remain  in effect (meaning this  test can be used) for the duration of the COVID-19 declaration under Section 564(b)(1) of the Act, 21 U.S.C.section 360bbb-3(b)(1), unless the authorization is terminated  or revoked sooner.       Influenza Kyvon Hu by PCR NEGATIVE NEGATIVE Final   Influenza B by PCR NEGATIVE NEGATIVE Final    Comment: (NOTE) The Xpert Xpress SARS-CoV-2/FLU/RSV plus assay is intended as an aid in the diagnosis of influenza from Nasopharyngeal swab specimens and should not be used as Adison Reifsteck sole basis for treatment. Nasal washings and aspirates are unacceptable for Xpert Xpress SARS-CoV-2/FLU/RSV testing.  Fact Sheet for Patients: EntrepreneurPulse.com.au  Fact Sheet for Healthcare Providers: IncredibleEmployment.be  This test is not yet approved or cleared by the [place] FDA and has been authorized for detection and/or diagnosis of  SARS-CoV-2 by FDA under an Emergency Use Authorization (EUA). This EUA will remain in effect (meaning this test can be used) for the duration of the COVID-19 declaration under Section 564(b)(1) of the Act, 21 U.S.C. section 360bbb-3(b)(1), unless the authorization is terminated or revoked.  Performed at [facility], [place] [address], [place], [place] [address]          Radiology Studies: DG Abd 1 View  Result Date: 10/05/2021 CLINICAL DATA:  Abdominal distention EXAM: ABDOMEN - 1 VIEW COMPARISON:  KUB, 09/30/2021 and 01/28/2019.  CT AP, 09/30/2021. FINDINGS: Suboptimal evaluation secondary to poor penetration and body habitus. Support lines: Interval removal of enteric feeding tube. Findings of known small bowel obstruction are poorly demonstrated on this evaluation. No radio-opaque calculi. No interval osseous abnormality. IMPRESSION: Suboptimal evaluation, within these constraints; 1. Interval removal of enteric feeding tube. 2. Findings of known small bowel obstruction are poorly demonstrated on this evaluation. Electronically Signed   By: Michaelle Birks M.D.   On: 10/05/2021 08:04   VAS [place] LOWER EXTREMITY VENOUS (DVT)  Result Date: 10/04/2021  Lower Venous DVT Study Patient Name:  BREUNA LOVEALL  Date of Exam:   10/04/2021 Medical Rec #: [id number]       Accession #:    [id number] Date of Birth: Oct 20, 1960       Patient Gender: F Patient Age:   2 years Exam Location:  [facility] Procedure:      VAS [place] LOWER EXTREMITY VENOUS (DVT) Referring Phys: Barkley Boards --------------------------------------------------------------------------------  Indications: Pain.  Limitations: Body habitus, poor ultrasound/tissue interface and patient pain intolerance. Comparison Study: Previous exam on 08/03/2019 was negative for DVT. Performing Technologist: Rogelia Rohrer RVT, RDMS  Examination Guidelines: Millisa Giarrusso complete evaluation includes B-mode imaging, spectral Doppler, color Doppler, and  power Doppler as needed of all accessible portions of each vessel. Bilateral testing is considered an integral part of Beau Ramsburg complete examination. Limited examinations for reoccurring indications may be performed as noted. The reflux portion of the exam is performed with the patient in reverse Trendelenburg.  +---------+---------------+---------+-----------+----------+-------------------+  RIGHT     Compressibility Phasicity Spontaneity Properties Thrombus Aging       +---------+---------------+---------+-----------+----------+-------------------+  CFV       Full            Yes       Yes                                         +---------+---------------+---------+-----------+----------+-------------------+  SFJ       Full                                                                  +---------+---------------+---------+-----------+----------+-------------------+  FV Prox   Full            Yes       Yes                                         +---------+---------------+---------+-----------+----------+-------------------+  FV Mid    Full            Yes       Yes                                         +---------+---------------+---------+-----------+----------+-------------------+  FV Distal Full            Yes       Yes                                         +---------+---------------+---------+-----------+----------+-------------------+  PFV       Full                                                                  +---------+---------------+---------+-----------+----------+-------------------+  POP       Full            Yes       Yes                                         +---------+---------------+---------+-----------+----------+-------------------+  PTV       Full                                                                  +---------+---------------+---------+-----------+----------+-------------------+  PERO      Full                                             Not well visualized   +---------+---------------+---------+-----------+----------+-------------------+   +--------+---------------+---------+-----------+----------+--------------------+  LEFT     Compressibility Phasicity Spontaneity Properties Thrombus Aging        +--------+---------------+---------+-----------+----------+--------------------+  CFV      Full            Yes       Yes                                          +--------+---------------+---------+-----------+----------+--------------------+  SFJ      Full                                                                   +--------+---------------+---------+-----------+----------+--------------------+  FV Prox  Full            Yes       Yes                                          +--------+---------------+---------+-----------+----------+--------------------+  FV Mid   Full            Yes       Yes                                          +--------+---------------+---------+-----------+----------+--------------------+  FV                       Yes       Yes                    patent by              Distal                                                    color/doppler         +--------+---------------+---------+-----------+----------+--------------------+  PFV      Full                                                                   +--------+---------------+---------+-----------+----------+--------------------+  POP      Full            Yes       Yes                                          +--------+---------------+---------+-----------+----------+--------------------+  PTV      Full                                             Not well visualized   +--------+---------------+---------+-----------+----------+--------------------+  PERO     Full                                             Not well visualized   +--------+---------------+---------+-----------+----------+--------------------+     Summary: BILATERAL: - No evidence of deep vein thrombosis seen in the lower  extremities, bilaterally. - No evidence of superficial venous thrombosis in the lower extremities, bilaterally. -No evidence of popliteal cyst, bilaterally.   *See table(s) above for measurements and observations. Electronically signed by Harold Barban MD on 10/04/2021 at 9:41:47 PM.    Final         Scheduled Meds:  acetaminophen  650 mg Oral Q6H  arformoterol  15 mcg Nebulization BID   And   umeclidinium bromide  1 puff Inhalation Daily   carvedilol  12.5 mg Oral BID   docusate sodium  100 mg Oral BID   enoxaparin (LOVENOX) injection  40 mg Subcutaneous Q12H   feeding supplement (GLUCERNA SHAKE)  237 mL Oral TID BM   furosemide  40 mg Oral BID   insulin aspart  0-15 Units Subcutaneous TID WC   montelukast  10 mg Oral QHS   pantoprazole  40 mg Oral Daily   polyethylene glycol  17 g Oral Daily   sacubitril-valsartan  1 tablet Oral BID   spironolactone  25 mg Oral Daily   Continuous Infusions:   LOS: 5 days    Time spent: over 30 min    Fayrene Helper, MD [facility]   To contact the attending provider between 7A-7P or the covering provider during after hours 7P-7A, please log into the web site [email or web address] and access using universal [place] password for that web site. If you do not have the password, please call the hospital operator.  10/05/2021, 2:11 PM

## 2021-10-05 NOTE — Assessment & Plan Note (Addendum)
1/6 CT with umbilical hernia containing loops of small bowel with surrounding fat stranding and small amount of free fluid, dilatation of small bowel loops proximal to hernia sac c/w SBO S/p open umbilical hernia repair with mesh underly on 1/6 by Dr. Benjaman Pott when OOB, mobilization, smaller portions - ok for discharge today per surgery Follow with surgery outpatient

## 2021-10-05 NOTE — Assessment & Plan Note (Signed)
PT not recommending follow up

## 2021-10-05 NOTE — Hospital Course (Addendum)
Sharon Swanson is Sharon Swanson 61 year old female with past medical history significant for type 2 diabetes mellitus, hyperlipidemia, essential hypertension, COPD on 3 L nasal cannula at baseline, chronic diastolic congestive heart failure, morbid obesity who presented to [facility] ED on 1/6 with complaints of abdominal pain associate with nausea/vomiting.  Patient with Sharon Swanson history of Sharon Swanson ventral hernia and was being followed by general surgery outpatient for eventual repair.  She was found to have incarcerated umbilical hernia and small bowel obstruction.  She underwent open umbilical hernia repair with mesh on 1/6.  At this time, gradually advancing diet as tolerated.  Sharon Swanson for discharge home today 1/12.  See below for additional details

## 2021-10-05 NOTE — Progress Notes (Signed)
Progress Note  5 Days Post-Op  Subjective: Pt reports she vomited after dinner but is feeling better this AM. She reports she still has some incisional pain but is able to get up and ambulate. Per RN patient was retching after eating entire breakfast tray this AM but unclear if she had emesis. She wanted to keep unopened items on breakfast tray after this.   Objective: Vital signs in last 24 hours: Temp:  [98.1 F (36.7 C)-99.1 F (37.3 C)] 98.2 F (36.8 C) (01/11 0520) Pulse Rate:  [63-82] 63 (01/11 0520) Resp:  [14-16] 16 (01/11 0520) BP: (136-154)/(69-83) 144/83 (01/11 0520) SpO2:  [92 %-99 %] 96 % (01/11 0759) Last BM Date: 10/04/21  Intake/Output from previous day: 01/10 0701 - 01/11 0700 In: 1440 [P.O.:1440] Out: 1250 [Urine:1250] Intake/Output this shift: Total I/O In: 400 [P.O.:400] Out: -   PE: General: pleasant, WD, morbidly obese female who is in bed in NAD Heart: regular, rate, and rhythm.   Lungs: Respiratory effort nonlabored Abd: soft, appropriately ttp, incision with steri-strips present and no signs of infection, binder present  Psych: A&Ox3 with an appropriate affect.    Lab Results:  Recent Labs    10/03/21 0426 10/05/21 0404  WBC 5.7 3.3*  HGB 11.7* 11.3*  HCT 38.2 36.7  PLT 221 222   BMET Recent Labs    10/03/21 0426 10/05/21 0404  NA 139 136  K 3.6 4.0  CL 103 100  CO2 27 28  GLUCOSE 137* 128*  BUN 7 8  CREATININE 0.60 0.67  CALCIUM 8.5* 8.6*   PT/INR No results for input(s): LABPROT, INR in the last 72 hours. CMP     Component Value Date/Time   NA 136 10/05/2021 0404   NA 142 02/02/2021 0923   K 4.0 10/05/2021 0404   CL 100 10/05/2021 0404   CO2 28 10/05/2021 0404   GLUCOSE 128 (H) 10/05/2021 0404   BUN 8 10/05/2021 0404   BUN 14 02/02/2021 0923   CREATININE 0.67 10/05/2021 0404   CALCIUM 8.6 (L) 10/05/2021 0404   PROT 6.5 10/05/2021 0404   PROT 7.0 02/02/2021 0923   ALBUMIN 3.2 (L) 10/05/2021 0404   ALBUMIN 4.3  02/02/2021 0923   AST 35 10/05/2021 0404   ALT 36 10/05/2021 0404   ALKPHOS 59 10/05/2021 0404   BILITOT 0.7 10/05/2021 0404   BILITOT 0.3 02/02/2021 0923   GFRNONAA >60 10/05/2021 0404   GFRAA 111 10/04/2020 1019   Lipase     Component Value Date/Time   LIPASE 46 09/30/2021 0152       Studies/Results: DG Abd 1 View  Result Date: 10/05/2021 CLINICAL DATA:  Abdominal distention EXAM: ABDOMEN - 1 VIEW COMPARISON:  KUB, 09/30/2021 and 01/28/2019.  CT AP, 09/30/2021. FINDINGS: Suboptimal evaluation secondary to poor penetration and body habitus. Support lines: Interval removal of enteric feeding tube. Findings of known small bowel obstruction are poorly demonstrated on this evaluation. No radio-opaque calculi. No interval osseous abnormality. IMPRESSION: Suboptimal evaluation, within these constraints; 1. Interval removal of enteric feeding tube. 2. Findings of known small bowel obstruction are poorly demonstrated on this evaluation. Electronically Signed   By: Michaelle Birks M.D.   On: 10/05/2021 08:04   VAS [place] LOWER EXTREMITY VENOUS (DVT)  Result Date: 10/04/2021  Lower Venous DVT Study Patient Name:  Sharon Swanson  Date of Exam:   10/04/2021 Medical Rec #: [id number]       Accession #:    [id number] Date of Birth: 1960/11/02  Patient Gender: F Patient Age:   61 years Exam Location:  [facility] Procedure:      VAS [place] LOWER EXTREMITY VENOUS (DVT) Referring Phys: Barkley Boards --------------------------------------------------------------------------------  Indications: Pain.  Limitations: Body habitus, poor ultrasound/tissue interface and patient pain intolerance. Comparison Study: Previous exam on 08/03/2019 was negative for DVT. Performing Technologist: Rogelia Rohrer RVT, RDMS  Examination Guidelines: A complete evaluation includes B-mode imaging, spectral Doppler, color Doppler, and power Doppler as needed of all accessible portions of each vessel. Bilateral testing is considered an  integral part of a complete examination. Limited examinations for reoccurring indications may be performed as noted. The reflux portion of the exam is performed with the patient in reverse Trendelenburg.  +---------+---------------+---------+-----------+----------+-------------------+  RIGHT     Compressibility Phasicity Spontaneity Properties Thrombus Aging       +---------+---------------+---------+-----------+----------+-------------------+  CFV       Full            Yes       Yes                                         +---------+---------------+---------+-----------+----------+-------------------+  SFJ       Full                                                                  +---------+---------------+---------+-----------+----------+-------------------+  FV Prox   Full            Yes       Yes                                         +---------+---------------+---------+-----------+----------+-------------------+  FV Mid    Full            Yes       Yes                                         +---------+---------------+---------+-----------+----------+-------------------+  FV Distal Full            Yes       Yes                                         +---------+---------------+---------+-----------+----------+-------------------+  PFV       Full                                                                  +---------+---------------+---------+-----------+----------+-------------------+  POP       Full            Yes       Yes                                         +---------+---------------+---------+-----------+----------+-------------------+  PTV       Full                                                                  +---------+---------------+---------+-----------+----------+-------------------+  PERO      Full                                             Not well visualized  +---------+---------------+---------+-----------+----------+-------------------+    +--------+---------------+---------+-----------+----------+--------------------+  LEFT     Compressibility Phasicity Spontaneity Properties Thrombus Aging        +--------+---------------+---------+-----------+----------+--------------------+  CFV      Full            Yes       Yes                                          +--------+---------------+---------+-----------+----------+--------------------+  SFJ      Full                                                                   +--------+---------------+---------+-----------+----------+--------------------+  FV Prox  Full            Yes       Yes                                          +--------+---------------+---------+-----------+----------+--------------------+  FV Mid   Full            Yes       Yes                                          +--------+---------------+---------+-----------+----------+--------------------+  FV                       Yes       Yes                    patent by              Distal                                                    color/doppler         +--------+---------------+---------+-----------+----------+--------------------+  PFV      Full                                                                   +--------+---------------+---------+-----------+----------+--------------------+  POP      Full            Yes       Yes                                          +--------+---------------+---------+-----------+----------+--------------------+  PTV      Full                                             Not well visualized   +--------+---------------+---------+-----------+----------+--------------------+  PERO     Full                                             Not well visualized   +--------+---------------+---------+-----------+----------+--------------------+     Summary: BILATERAL: - No evidence of deep vein thrombosis seen in the lower extremities, bilaterally. - No evidence of superficial venous thrombosis in the lower  extremities, bilaterally. -No evidence of popliteal cyst, bilaterally.   *See table(s) above for measurements and observations. Electronically signed by Harold Barban MD on 10/04/2021 at 9:41:47 PM.    Final     Anti-infectives: Anti-infectives (From admission, onward)    Start     Dose/Rate Route Frequency Ordered Stop   09/30/21 1130  ceFAZolin (ANCEF) IVPB 3g/100 mL premix        3 g 200 mL/hr over 30 Minutes Intravenous On call to O.R. 09/30/21 0836 09/30/21 1205        Assessment/Plan POD5 s/p umbilical hernia repair with mesh - Dr. Michaelle Birks - some n/v on soft diet but still having some bowel function - encourage smaller portions - abdominal film this AM pretty unremarkable  - continue binder when OOB - continue mobilization  - stable for discharge from a surgical standpoint when tolerating soft diet    FEN: soft diet, glucerna VTE: LMWH ID: ancef pre-op    COPD on home oxygen HTN Chronic diastolic CHF L9RV Morbid obesity - BMI 49.24 Leg swelling and numbness - suspect this is more than likely secondary to decreased mobility and arthritis but will order duplex to r/o DVT Chronic arthritis of low back   LOS: 5 days   I reviewed hospitalist notes, last 24 h vitals and pain scores, last 48 h intake and output, last 24 h labs and trends, and last 24 h imaging results.  This care required low level of medical decision making.    Norm Parcel, [facility] 10/05/2021, 10:07 AM Please see Amion for pager number during day hours 7:00am-4:30pm

## 2021-10-05 NOTE — Assessment & Plan Note (Signed)
lasix, coreg, entresto, spironolactone

## 2021-10-05 NOTE — Progress Notes (Signed)
OT Cancellation Note  Patient Details Name: Sharon Swanson MRN: [id number] DOB: June 12, 1961   Cancelled Treatment:    Reason Eval/Treat Not Completed: Other (comment) First attempt patient getting breathing treatment, second attempt patient with bout of emesis in bathroom. Will re-attempt as schedule permits.  Delbert Phenix OT OT pager: [phone]   Rosemary Holms 10/05/2021, 9:07 AM

## 2021-10-06 ENCOUNTER — Other Ambulatory Visit: Payer: Self-pay

## 2021-10-06 LAB — COMPREHENSIVE METABOLIC PANEL
ALT: 59 U/L — ABNORMAL HIGH (ref 0–44)
AST: 51 U/L — ABNORMAL HIGH (ref 15–41)
Albumin: 3.4 g/dL — ABNORMAL LOW (ref 3.5–5.0)
Alkaline Phosphatase: 73 U/L (ref 38–126)
Anion gap: 9 (ref 5–15)
BUN: 10 mg/dL (ref 6–20)
CO2: 33 mmol/L — ABNORMAL HIGH (ref 22–32)
Calcium: 8.8 mg/dL — ABNORMAL LOW (ref 8.9–10.3)
Chloride: 97 mmol/L — ABNORMAL LOW (ref 98–111)
Creatinine, Ser: 0.66 mg/dL (ref 0.44–1.00)
GFR, Estimated: >60 mL/min (ref >60)
Glucose, Bld: 129 mg/dL — ABNORMAL HIGH (ref 70–99)
Potassium: 3.6 mmol/L (ref 3.5–5.1)
Sodium: 139 mmol/L (ref 135–145)
Total Bilirubin: 0.5 mg/dL (ref 0.3–1.2)
Total Protein: 7 g/dL (ref 6.5–8.1)

## 2021-10-06 LAB — CBC WITH DIFFERENTIAL/PLATELET
Abs Immature Granulocytes: 0 10*3/uL (ref 0.00–0.07)
Basophils Absolute: 0 10*3/uL (ref 0.0–0.1)
Basophils Relative: 0 %
Eosinophils Absolute: 0.2 10*3/uL (ref 0.0–0.5)
Eosinophils Relative: 6 %
HCT: 37.5 % (ref 36.0–46.0)
Hemoglobin: 11.6 g/dL — ABNORMAL LOW (ref 12.0–15.0)
Immature Granulocytes: 0 %
Lymphocytes Relative: 26 %
Lymphs Abs: 0.9 10*3/uL (ref 0.7–4.0)
MCH: 28.9 pg (ref 26.0–34.0)
MCHC: 30.9 g/dL (ref 30.0–36.0)
MCV: 93.5 fL (ref 80.0–100.0)
Monocytes Absolute: 0.6 10*3/uL (ref 0.1–1.0)
Monocytes Relative: 16 %
Neutro Abs: 1.8 10*3/uL (ref 1.7–7.7)
Neutrophils Relative %: 52 %
Platelets: 232 10*3/uL (ref 150–400)
RBC: 4.01 MIL/uL (ref 3.87–5.11)
RDW: 13.4 % (ref 11.5–15.5)
WBC: 3.5 10*3/uL — ABNORMAL LOW (ref 4.0–10.5)
nRBC: 0 % (ref 0.0–0.2)

## 2021-10-06 LAB — GLUCOSE, CAPILLARY
Glucose-Capillary: 161 mg/dL — ABNORMAL HIGH (ref 70–99)
Glucose-Capillary: 147 mg/dL — ABNORMAL HIGH (ref 70–99)

## 2021-10-06 LAB — MAGNESIUM: Magnesium: 1.6 mg/dL — ABNORMAL LOW (ref 1.7–2.4)

## 2021-10-06 LAB — PHOSPHORUS: Phosphorus: 3.9 mg/dL (ref 2.5–4.6)

## 2021-10-06 MED ORDER — INSULIN GLARGINE 100 UNIT/ML ~~LOC~~ SOLN: 15.0000 [IU] | Freq: Every day | SUBCUTANEOUS | Status: DC

## 2021-10-06 MED ORDER — MAGNESIUM SULFATE 2 GM/50ML IV SOLN
2.0000 g | Freq: Once | INTRAVENOUS | Status: AC
  Administered 2021-10-06: 2 g via INTRAVENOUS
  Filled 2021-10-06: qty 50

## 2021-10-06 MED ORDER — TRAMADOL HCL 50 MG PO TABS: 50.0000 mg | ORAL_TABLET | Freq: Four times a day (QID) | ORAL | 0 refills | Status: DC | PRN

## 2021-10-06 MED ORDER — MAGNESIUM OXIDE 400 MG PO CAPS: 400.0000 mg | ORAL_CAPSULE | Freq: Every day | ORAL | 0 refills | Status: AC

## 2021-10-06 MED ORDER — DULOXETINE HCL 30 MG PO CPEP: 60.0000 mg | ORAL_CAPSULE | Freq: Every day | ORAL | Status: DC

## 2021-10-06 MED ORDER — INSULIN ASPART 100 UNIT/ML IJ SOLN: 0.0000 [IU] | Freq: Three times a day (TID) | INTRAMUSCULAR | 11 refills | Status: DC

## 2021-10-06 NOTE — Progress Notes (Signed)
PIV removed. AVS reviewed. Patient verbalizes understanding of necessary medication regimen and follow up appointments.  

## 2021-10-06 NOTE — Discharge Summary (Signed)
Physician Discharge Summary  Sharon Swanson DHR:[id number] DOB: 1960/10/05 DOA: 09/30/2021  PCP: Vevelyn Francois, NP  Admit date: 09/30/2021 Discharge date: 10/06/2021  Time spent: 40 minutes  Recommendations for Outpatient Follow-up:  Follow outpatient CBC/CMP Follow mg outpatient Follow with general surgery for post op care Follow blood sugar and insulin needs outpatient  (doses of insulin reduced at discharge - follow for adjustment)   Discharge Diagnoses:  Principal Problem:   SBO (small bowel obstruction) ([place]) Active Problems:   Incarcerated umbilical hernia   Insulin-requiring or dependent type II diabetes mellitus ([place])   Essential hypertension   Chronic diastolic CHF (congestive heart failure) (HCC)   COPD (chronic obstructive pulmonary disease) (HCC)   Chronic respiratory failure (HCC)   Edema   Weakness   BMI 45.0-49.9, adult ([place])   Pulmonary nodule, left   OSA (obstructive sleep apnea)   Dependence on continuous supplemental oxygen   Discharge Condition: stable  Diet recommendation: heart healthy, diabetic  Filed Weights   10/02/21 0622 10/03/21 0500 10/06/21 0500  Weight: (!) 144.6 kg (!) 142.6 kg (!) 137.8 kg    History of present illness:  Sharon Swanson is Sharon Swanson 61 year old female with past medical history significant for type 2 diabetes mellitus, hyperlipidemia, essential hypertension, COPD on 3 L nasal cannula at baseline, chronic diastolic congestive heart failure, morbid obesity who presented to [facility] ED on 1/6 with complaints of abdominal pain associate with nausea/vomiting.  Patient with Sharon Swanson history of Sharon Swanson ventral hernia and was being followed by general surgery outpatient for eventual repair.  She was found to have incarcerated umbilical hernia and small bowel obstruction.  She underwent open umbilical hernia repair with mesh on 1/6.  At this time, gradually advancing diet as tolerated.  Sharon Swanson for discharge home today 1/12.  See below for additional details      Hospital Course:  * SBO (small bowel obstruction) ([place])- (present on admission) 1/6 CT with umbilical hernia containing loops of small bowel with surrounding fat stranding and small amount of free fluid, dilatation of small bowel loops proximal to hernia sac c/w SBO S/p open umbilical hernia repair with mesh underly on 1/6 by Dr. Benjaman Pott when OOB, mobilization, smaller portions - ok for discharge today per surgery Follow with surgery outpatient  Insulin-requiring or dependent type II diabetes mellitus ([place]) On glipizide 10 mg BID, lantus 30 units nightly, novolog 20 units TID with meals, metformin 1000 mg PO BID at home  Hasn't needed much insulin here - stop glipizide.  Cut lantus in half to 15 units nightly at discharge, change novolog to sliding scale.  Follow with PCP outpatient for additional recommendations as needed.  Essential hypertension- (present on admission) lasix, coreg, entresto, spironolactone  Chronic diastolic CHF (congestive heart failure) ([place]) Echo 02/2021 with EF 46-80%, LV diastolic parameters indeterminate Lasix, coreg, entresto, spironolactone  COPD (chronic obstructive pulmonary disease) ([place])- (present on admission) On 3 L chronically at baseline Singuilar, stiolto at home Follows with Dr. Vaughan Browner outpatient  Edema Continue lasix [place] negative for DVT  Weakness PT not recommending follow up   Procedures: Open umbilical hernia repair with mesh underly   Consultations: Gen surgery  Discharge Exam: Vitals:   10/06/21 0745 10/06/21 0813  BP:  (!) 152/81  Pulse:  68  Resp:    Temp:    SpO2: 96% 100%   Eager to discharge Husband at bedside Discussed meds and changes  General: No acute distress. Cardiovascular: RRR Lungs: unlabored Abdomen: binder in place, non ttp Neurological:  Alert and oriented 3. Moves all extremities 4. Cranial nerves II through XII grossly intact. Skin: Warm and dry. No rashes or lesions. Extremities: No  clubbing or cyanosis. No edema.   Discharge Instructions   Discharge Instructions     Call MD for:  difficulty breathing, headache or visual disturbances   Complete by: As directed    Call MD for:  extreme fatigue   Complete by: As directed    Call MD for:  hives   Complete by: As directed    Call MD for:  persistant dizziness or light-headedness   Complete by: As directed    Call MD for:  persistant nausea and vomiting   Complete by: As directed    Call MD for:  redness, tenderness, or signs of infection (pain, swelling, redness, odor or green/yellow discharge around incision site)   Complete by: As directed    Call MD for:  severe uncontrolled pain   Complete by: As directed    Call MD for:  temperature >100.4   Complete by: As directed    Diet - low sodium heart healthy   Complete by: As directed    Discharge instructions   Complete by: As directed    You were seen for Sharon Swanson small bowel obstruction and incarcerated umbilical hernia that has been surgically repaired.  Follow up with surgery as planned outpatient.  Continue your binder when up and about.  Continue small portions.    We'll reduce your insulin doses.  Try 15 units of lantus daily and we'll give you Sharon Swanson sliding scale for the novolog.  Call your PCP if you experience lows for instructions on how to adjust your regimen further.  Your magnesium was low, we'll send you with magnesium outpatient.  You'll need to follow this outpatient with your PCP.  Return for new, recurrent, or worsening symptoms.  Please ask your PCP to request records from this hospitalization so they know what was done and what the next steps will be.   Discharge wound care:   Complete by: As directed    Per surgery   Increase activity slowly   Complete by: As directed       Allergies as of 10/06/2021       Reactions   Aspirin Other (See Comments)   Abdominal pain Abdominal pain Abdominal pain Abdominal pain Abdominal pain Abdominal  pain Abdominal pain        Medication List     STOP taking these medications    Dulaglutide 0.75 MG/0.5ML Sopn   glipiZIDE 10 MG tablet Commonly known as: GLUCOTROL       TAKE these medications    acetaminophen 325 MG tablet Commonly known as: TYLENOL Take 2 tablets (650 mg total) by mouth every 6 (six) hours as needed.   albuterol 108 (90 Base) MCG/ACT inhaler Commonly known as: VENTOLIN HFA USE 2 INHALATIONS BY MOUTH  INTO THE LUNGS EVERY 4  HOURS AS NEEDED FOR  WHEEZING OR SHORTNESS OF  BREATH What changed: See the new instructions.   atorvastatin 20 MG tablet Commonly known as: LIPITOR Take 1 tablet (20 mg total) by mouth daily.   carvedilol 12.5 MG tablet Commonly known as: COREG Take 1 tablet (12.5 mg total) by mouth 2 (two) times daily.   cetirizine 10 MG tablet Commonly known as: ZYRTEC Take 1 tablet (10 mg total) by mouth in the morning. What changed:  when to take this reasons to take this   [place]  every 10 days as needed/directed   Dexcom G6 Transmitter Misc Use as directed   docusate sodium 100 MG capsule Commonly known as: Colace Take 1 capsule (100 mg total) by mouth 3 (three) times daily as needed for mild constipation.   DULoxetine 30 MG capsule Commonly known as: Cymbalta Take 2 capsules (60 mg total) by mouth daily. 60 mg daily- for nerve and back pain   Entresto 97-103 MG Generic drug: sacubitril-valsartan Take 1 tablet by mouth 2 (two) times daily.   fluticasone 50 MCG/ACT nasal spray Commonly known as: FLONASE Place 1 spray into both nostrils daily.   FreeStyle [place] 2 Reader [place] Use as directed daily.   FreeStyle Libre 2 Sensor Misc 1 application by Does not apply route every 14 (fourteen) days.   furosemide 40 MG tablet Commonly known as: LASIX Take 1 tablet (40 mg total) by mouth 2 (two) times daily.   gabapentin 300 MG capsule Commonly known as: NEURONTIN Take 1 capsule (300 mg total)  by mouth 3 (three) times daily.   glucose monitoring kit monitoring kit 1 each by Does not apply route as needed for other.   insulin aspart 100 UNIT/ML injection Commonly known as: novoLOG Inject 0-15 Units into the skin 3 (three) times daily with meals. Sliding Scale as noted below CBG <70: treat low blood sugar CBG 70-120: 0 units CBG 121-150: 2 units novolog CBG 151-200: 3 units novolog CBG 201-250: 5 units novolog CBG 251-300: 8 units novolog CBG 301-350: 11 units novolog CBG 351-400: 15 units novology CBG >400: call MD What changed:  how much to take additional instructions   insulin glargine 100 UNIT/ML injection Commonly known as: LANTUS Inject 0.15 mLs (15 Units total) into the skin at bedtime. What changed:  how much to take when to take this   ipratropium-albuterol 0.5-2.5 (3) MG/3ML Soln Commonly known as: DUONEB Take 3 mLs by nebulization every 6 (six) hours as needed (shortness of breath, wheeze).   Magnesium Oxide 400 MG Caps Take 1 capsule (400 mg total) by mouth daily.   metFORMIN 1000 MG tablet Commonly known as: GLUCOPHAGE Take 1 tablet (1,000 mg total) by mouth 2 (two) times daily with Sharon Swanson meal.   methocarbamol 750 MG tablet Commonly known as: ROBAXIN Take 1 tablet (750 mg total) by mouth 3 (three) times daily as needed for muscle spasms.   montelukast 10 MG tablet Commonly known as: SINGULAIR Take 1 tablet (10 mg total) by mouth at bedtime.   ondansetron 4 MG tablet Commonly known as: Zofran Take 1 tablet (4 mg total) by mouth every 8 (eight) hours as needed for nausea or vomiting.   pantoprazole 40 MG tablet Commonly known as: PROTONIX Take 1 tablet (40 mg total) by mouth daily.   spironolactone 25 MG tablet Commonly known as: ALDACTONE TAKE 1 TABLET (25 MG TOTAL) BY MOUTH DAILY.   Stiolto Respimat 2.5-2.5 MCG/ACT Aers Generic drug: Tiotropium Bromide-Olodaterol Inhale 2 puffs into the lungs daily.   traMADol 50 MG tablet Commonly  known as: ULTRAM Take 1-2 tablets (50-100 mg total) by mouth every 6 (six) hours as needed for moderate pain or severe pain.   Vitamin D (Ergocalciferol) 1.25 MG (50000 UNIT) Caps capsule Commonly known as: DRISDOL TAKE 1 CAPSULE (50,000 UNITS TOTAL) BY MOUTH EVERY 7 (SEVEN) DAYS. What changed:  how much to take additional instructions               Discharge Care Instructions  (From admission, onward)  Start     Ordered   10/06/21 0000  Discharge wound care:       Comments: Per surgery   10/06/21 1135           Allergies  Allergen Reactions   Aspirin Other (See Comments)    Abdominal pain Abdominal pain Abdominal pain Abdominal pain Abdominal pain Abdominal pain Abdominal pain    Follow-up Information     Surgery, [place]. Go on 10/14/2021.   Specialty: General Surgery Why: 9:00 AM for staple removal. Please arrive 30 min prior to appointment time for check in. Bring photo ID and insurance information. Contact information: [address] [place] [address] [phone]         Carlena Hurl, PA-C. Go on 10/25/2021.   Specialty: General Surgery Why: 9:45 AM. Please arrive 15 min prior to appointment time. Bring photo ID and insurance information. Contact information: [address] [place] [address] [phone]                  The results of significant diagnostics from this hospitalization (including imaging, microbiology, ancillary and laboratory) are listed below for reference.    Significant Diagnostic Studies: DG Abd 1 View  Result Date: 10/05/2021 CLINICAL DATA:  Abdominal distention EXAM: ABDOMEN - 1 VIEW COMPARISON:  KUB, 09/30/2021 and 01/28/2019.  CT AP, 09/30/2021. FINDINGS: Suboptimal evaluation secondary to poor penetration and body habitus. Support lines: Interval removal of enteric feeding tube. Findings of known small bowel obstruction are poorly demonstrated on this  evaluation. No radio-opaque calculi. No interval osseous abnormality. IMPRESSION: Suboptimal evaluation, within these constraints; 1. Interval removal of enteric feeding tube. 2. Findings of known small bowel obstruction are poorly demonstrated on this evaluation. Electronically Signed   By: Michaelle Birks M.D.   On: 10/05/2021 08:04   DG Abd 1 View  Result Date: 09/30/2021 CLINICAL DATA:  Enteric catheter placement EXAM: ABDOMEN - 1 VIEW COMPARISON:  01/28/2019, 09/30/2021 FINDINGS: Supine frontal view of the abdomen and pelvis excludes the bilateral flanks and right hemidiaphragm by collimation. Enteric catheter is coiled over the gastric fundus. Dilated small bowel consistent with obstruction. Excreted contrast within the bladder from previous CT. IMPRESSION: 1. Enteric catheter coiled over the gastric fundus. 2. Continued small bowel obstruction. Electronically Signed   By: Randa Ngo M.D.   On: 09/30/2021 15:20   CT ABDOMEN PELVIS W CONTRAST  Result Date: 09/30/2021 CLINICAL DATA:  Complicated umbilical hernia, concern for possible incarceration. EXAM: CT ABDOMEN AND PELVIS WITH CONTRAST TECHNIQUE: Multidetector CT imaging of the abdomen and pelvis was performed using the standard protocol following bolus administration of intravenous contrast. CONTRAST:  14m OMNIPAQUE IOHEXOL 350 MG/ML SOLN COMPARISON:  05/04/2020. FINDINGS: Lower chest: The heart is enlarged and there is no pericardial effusion. Strandy atelectasis is noted at the lung bases. Hepatobiliary: The liver is enlarged and fatty infiltration is noted. No biliary ductal dilatation. The gallbladder is without stones. Pancreas: Unremarkable. No pancreatic ductal dilatation or surrounding inflammatory changes. Spleen: Normal in size without focal abnormality. Adrenals/Urinary Tract: Adrenal glands are unremarkable. Kidneys are normal, without renal calculi, focal lesion, or hydronephrosis. The bladder is decompressed. Stomach/Bowel: The stomach  is within normal limits. Multiple dilated loops of small bowel are seen in the abdomen measuring up to 3.4 cm in diameter to the level of Sharon Swanson large umbilical hernia. The bowel in the hernia sac is normal in caliber. Fat stranding and ascites are noted in the hernia sac. The  bowel distal to the hernia sac is normal in caliber. No free air or pneumatosis is seen. Multiple scattered diverticula are present along the colon without evidence of diverticulitis. Sharon Swanson normal appendix is seen in the right lower quadrant. Vascular/Lymphatic: Aortic atherosclerosis. No enlarged abdominal or pelvic lymph nodes. Reproductive: The uterus is enlarged for patient the stated age. No adnexal mass is seen. Other: Small amount fluid is noted in the umbilical hernia. Musculoskeletal: Degenerative changes in the thoracolumbar spine. No acute osseous abnormality. IMPRESSION: 1. Umbilical hernia containing loops of small-bowel with surrounding fat stranding and Sharon Swanson small amount of free fluid. There is dilatation of the small bowel loops proximal to the hernia sac, compatible with small-bowel obstruction. Surgical consultation is recommended. 2. Hepatomegaly with steatosis. 3. Cardiomegaly. 4. Aortic atherosclerosis. Critical findings were reported to Dr. Florina Ou at 3:45 Mani Celestin.m. Electronically Signed   By: Brett Fairy M.D.   On: 09/30/2021 03:48   VAS [place] LOWER EXTREMITY VENOUS (DVT)  Result Date: 10/04/2021  Lower Venous DVT Study Patient Name:  ISHANI GOLDWASSER  Date of Exam:   10/04/2021 Medical Rec #: [id number]       Accession #:    [id number] Date of Birth: 10-15-1960       Patient Gender: F Patient Age:   25 years Exam Location:  [facility] Procedure:      VAS [place] LOWER EXTREMITY VENOUS (DVT) Referring Phys: Barkley Boards --------------------------------------------------------------------------------  Indications: Pain.  Limitations: Body habitus, poor ultrasound/tissue interface and patient pain intolerance. Comparison Study: Previous  exam on 08/03/2019 was negative for DVT. Performing Technologist: Rogelia Rohrer RVT, RDMS  Examination Guidelines: Najee Manninen complete evaluation includes B-mode imaging, spectral Doppler, color Doppler, and power Doppler as needed of all accessible portions of each vessel. Bilateral testing is considered an integral part of Maxime Beckner complete examination. Limited examinations for reoccurring indications may be performed as noted. The reflux portion of the exam is performed with the patient in reverse Trendelenburg.  +---------+---------------+---------+-----------+----------+-------------------+  RIGHT     Compressibility Phasicity Spontaneity Properties Thrombus Aging       +---------+---------------+---------+-----------+----------+-------------------+  CFV       Full            Yes       Yes                                         +---------+---------------+---------+-----------+----------+-------------------+  SFJ       Full                                                                  +---------+---------------+---------+-----------+----------+-------------------+  FV Prox   Full            Yes       Yes                                         +---------+---------------+---------+-----------+----------+-------------------+  FV Mid    Full            Yes       Yes                                         +---------+---------------+---------+-----------+----------+-------------------+  FV Distal Full            Yes       Yes                                         +---------+---------------+---------+-----------+----------+-------------------+  PFV       Full                                                                  +---------+---------------+---------+-----------+----------+-------------------+  POP       Full            Yes       Yes                                         +---------+---------------+---------+-----------+----------+-------------------+  PTV       Full                                                                   +---------+---------------+---------+-----------+----------+-------------------+  PERO      Full                                             Not well visualized  +---------+---------------+---------+-----------+----------+-------------------+   +--------+---------------+---------+-----------+----------+--------------------+  LEFT     Compressibility Phasicity Spontaneity Properties Thrombus Aging        +--------+---------------+---------+-----------+----------+--------------------+  CFV      Full            Yes       Yes                                          +--------+---------------+---------+-----------+----------+--------------------+  SFJ      Full                                                                   +--------+---------------+---------+-----------+----------+--------------------+  FV Prox  Full            Yes       Yes                                          +--------+---------------+---------+-----------+----------+--------------------+  FV Mid   Full            Yes       Yes                                          +--------+---------------+---------+-----------+----------+--------------------+  FV                       Yes       Yes                    patent by              Distal                                                    color/doppler         +--------+---------------+---------+-----------+----------+--------------------+  PFV      Full                                                                   +--------+---------------+---------+-----------+----------+--------------------+  POP      Full            Yes       Yes                                          +--------+---------------+---------+-----------+----------+--------------------+  PTV      Full                                             Not well visualized   +--------+---------------+---------+-----------+----------+--------------------+  PERO     Full                                             Not well  visualized   +--------+---------------+---------+-----------+----------+--------------------+     Summary: BILATERAL: - No evidence of deep vein thrombosis seen in the lower extremities, bilaterally. - No evidence of superficial venous thrombosis in the lower extremities, bilaterally. -No evidence of popliteal cyst, bilaterally.   *See table(s) above for measurements and observations. Electronically signed by Harold Barban MD on 10/04/2021 at 9:41:47 PM.    Final     Microbiology: Recent Results (from the past 240 hour(s))  Resp Panel by RT-PCR (Flu Sharon Swanson&B, Covid) Nasopharyngeal Swab     Status: None   Collection Time: 09/30/21  4:08 AM   Specimen: Nasopharyngeal Swab; Nasopharyngeal(NP) swabs in vial transport medium  Result Value Ref Range Status   SARS Coronavirus 2 by RT PCR NEGATIVE NEGATIVE Final    Comment: (NOTE) SARS-CoV-2 target nucleic acids are NOT DETECTED.  The SARS-CoV-2 RNA is generally detectable in upper respiratory specimens during the acute phase of infection. The lowest concentration of SARS-CoV-2 viral copies this assay can detect is 138 copies/mL. Sharon Swanson negative result does not preclude SARS-Cov-2 infection and should not be used as the sole basis for treatment or other patient management decisions. Lauriel Helin negative result may occur with  improper specimen collection/handling, submission of specimen other than nasopharyngeal swab, presence of viral mutation(s)  within the areas targeted by this assay, and inadequate number of viral copies(<138 copies/mL). Sharon Swanson negative result must be combined with clinical observations, patient history, and epidemiological information. The expected result is Negative.  Fact Sheet for Patients:  Sharon Swanson  Fact Sheet for Healthcare Providers:  IncredibleEmployment.be  This test is no t yet approved or cleared by the [place] FDA and  has been authorized for detection and/or diagnosis of  SARS-CoV-2 by FDA under an Emergency Use Authorization (EUA). This EUA will remain  in effect (meaning this test can be used) for the duration of the COVID-19 declaration under Section 564(b)(1) of the Act, 21 U.S.C.section 360bbb-3(b)(1), unless the authorization is terminated  or revoked sooner.       Influenza Sharon Swanson by PCR NEGATIVE NEGATIVE Final   Influenza B by PCR NEGATIVE NEGATIVE Final    Comment: (NOTE) The Xpert Xpress SARS-CoV-2/FLU/RSV plus assay is intended as an aid in the diagnosis of influenza from Nasopharyngeal swab specimens and should not be used as Sharon Swanson sole basis for treatment. Nasal washings and aspirates are unacceptable for Xpert Xpress SARS-CoV-2/FLU/RSV testing.  Fact Sheet for Patients: Sharon Swanson  Fact Sheet for Healthcare Providers: IncredibleEmployment.be  This test is not yet approved or cleared by the [place] FDA and has been authorized for detection and/or diagnosis of SARS-CoV-2 by FDA under an Emergency Use Authorization (EUA). This EUA will remain in effect (meaning this test can be used) for the duration of the COVID-19 declaration under Section 564(b)(1) of the Act, 21 U.S.C. section 360bbb-3(b)(1), unless the authorization is terminated or revoked.  Performed at [facility], [place] [address], [place], [place] [address]      Labs: Basic Metabolic Panel: Recent Labs  Lab 09/30/21 0152 10/01/21 0455 10/03/21 0426 10/05/21 0404 10/06/21 0359  NA 135 139 139 136 139  K 4.1 4.2 3.6 4.0 3.6  CL 98 101 103 100 97*  CO2 25 28 27 28  33*  GLUCOSE 224* 145* 137* 128* 129*  BUN 16 11 7 8 10   CREATININE 0.73 0.65 0.60 0.67 0.66  CALCIUM 10.1 8.6* 8.5* 8.6* 8.8*  MG  --   --   --  1.7 1.6*  PHOS  --   --   --   --  3.9   Liver Function Tests: Recent Labs  Lab 09/30/21 0152 10/01/21 0455 10/05/21 0404 10/06/21 0359  AST 27 18 35 51*  ALT 34 23 36 59*  ALKPHOS 79  62 59 73  BILITOT 0.7 0.5 0.7 0.5  PROT 8.4* 7.0 6.5 7.0  ALBUMIN 4.6 3.6 3.2* 3.4*   Recent Labs  Lab 09/30/21 0152  LIPASE 46   No results for input(s): AMMONIA in the last 168 hours. CBC: Recent Labs  Lab 09/30/21 0152 10/01/21 0455 10/03/21 0426 10/05/21 0404 10/06/21 0359  WBC 9.6 9.7 5.7 3.3* 3.5*  NEUTROABS 8.0*  --   --   --  1.8  HGB 14.9 12.9 11.7* 11.3* 11.6*  HCT 47.4* 41.2 38.2 36.7 37.5  MCV 91.9 93.2 95.5 92.4 93.5  PLT 268 243 221 222 232   Cardiac Enzymes: No results for input(s): CKTOTAL, CKMB, CKMBINDEX, TROPONINI in the last 168 hours. BNP: BNP (last 3 results) No results for input(s): BNP in the last 8760 hours.  ProBNP (last 3 results) Recent Labs    02/02/21 0923  PROBNP 11    CBG: Recent Labs  Lab 10/05/21 0715 10/05/21 1145 10/05/21 1710 10/05/21 2102 10/06/21 0729  GLUCAP 189*  126* 175* 138* 161*       Signed:  Fayrene Helper MD.  [facility] 10/06/2021, 11:38 AM

## 2021-10-06 NOTE — Care Management Important Message (Signed)
Important Message  Patient Details IM Letter given to the Patient. Name: Sharon Swanson MRN: [id number] Date of Birth: Oct 09, 1960   Medicare Important Message Given:  Yes     Kerin Salen 10/06/2021, 1:06 PM

## 2021-10-06 NOTE — Progress Notes (Signed)
Progress Note  6 Days Post-Op  Subjective: Pt reports less nausea this AM and no further emesis after breakfast yesterday. She has tried to eat smaller portions. She is still passing flatus. She had a small BM overnight.   Objective: Vital signs in last 24 hours: Temp:  [97.9 F (36.6 C)-98.6 F (37 C)] 98.6 F (37 C) (01/12 0549) Pulse Rate:  [67-77] 68 (01/12 0813) Resp:  [18] 18 (01/12 0549) BP: (123-154)/(61-85) 152/81 (01/12 0813) SpO2:  [94 %-100 %] 100 % (01/12 0813) Weight:  [137.8 kg] 137.8 kg (01/12 0500) Last BM Date: 10/06/21  Intake/Output from previous day: 01/11 0701 - 01/12 0700 In: 1600 [P.O.:1600] Out: 301 [Urine:300; Stool:1] Intake/Output this shift: No intake/output data recorded.  PE: General: pleasant, WD, morbidly obese female who is in bed in NAD Heart: regular, rate, and rhythm.   Lungs: Respiratory effort nonlabored Abd: soft, appropriately ttp, incision with steri-strips present and no signs of infection, binder present  Psych: A&Ox3 with an appropriate affect.    Lab Results:  Recent Labs    10/05/21 0404 10/06/21 0359  WBC 3.3* 3.5*  HGB 11.3* 11.6*  HCT 36.7 37.5  PLT 222 232   BMET Recent Labs    10/05/21 0404 10/06/21 0359  NA 136 139  K 4.0 3.6  CL 100 97*  CO2 28 33*  GLUCOSE 128* 129*  BUN 8 10  CREATININE 0.67 0.66  CALCIUM 8.6* 8.8*   PT/INR No results for input(s): LABPROT, INR in the last 72 hours. CMP     Component Value Date/Time   NA 139 10/06/2021 0359   NA 142 02/02/2021 0923   K 3.6 10/06/2021 0359   CL 97 (L) 10/06/2021 0359   CO2 33 (H) 10/06/2021 0359   GLUCOSE 129 (H) 10/06/2021 0359   BUN 10 10/06/2021 0359   BUN 14 02/02/2021 0923   CREATININE 0.66 10/06/2021 0359   CALCIUM 8.8 (L) 10/06/2021 0359   PROT 7.0 10/06/2021 0359   PROT 7.0 02/02/2021 0923   ALBUMIN 3.4 (L) 10/06/2021 0359   ALBUMIN 4.3 02/02/2021 0923   AST 51 (H) 10/06/2021 0359   ALT 59 (H) 10/06/2021 0359   ALKPHOS 73  10/06/2021 0359   BILITOT 0.5 10/06/2021 0359   BILITOT 0.3 02/02/2021 0923   GFRNONAA >60 10/06/2021 0359   GFRAA 111 10/04/2020 1019   Lipase     Component Value Date/Time   LIPASE 46 09/30/2021 0152       Studies/Results: DG Abd 1 View  Result Date: 10/05/2021 CLINICAL DATA:  Abdominal distention EXAM: ABDOMEN - 1 VIEW COMPARISON:  KUB, 09/30/2021 and 01/28/2019.  CT AP, 09/30/2021. FINDINGS: Suboptimal evaluation secondary to poor penetration and body habitus. Support lines: Interval removal of enteric feeding tube. Findings of known small bowel obstruction are poorly demonstrated on this evaluation. No radio-opaque calculi. No interval osseous abnormality. IMPRESSION: Suboptimal evaluation, within these constraints; 1. Interval removal of enteric feeding tube. 2. Findings of known small bowel obstruction are poorly demonstrated on this evaluation. Electronically Signed   By: Michaelle Birks M.D.   On: 10/05/2021 08:04   VAS [place] LOWER EXTREMITY VENOUS (DVT)  Result Date: 10/04/2021  Lower Venous DVT Study Patient Name:  Sharon Swanson  Date of Exam:   10/04/2021 Medical Rec #: [id number]       Accession #:    [id number] Date of Birth: 03-11-1961       Patient Gender: F Patient Age:   24 years Exam Location:  [facility] Procedure:      VAS [place] LOWER EXTREMITY VENOUS (DVT) Referring Phys: Barkley Boards --------------------------------------------------------------------------------  Indications: Pain.  Limitations: Body habitus, poor ultrasound/tissue interface and patient pain intolerance. Comparison Study: Previous exam on 08/03/2019 was negative for DVT. Performing Technologist: Rogelia Rohrer RVT, RDMS  Examination Guidelines: A complete evaluation includes B-mode imaging, spectral Doppler, color Doppler, and power Doppler as needed of all accessible portions of each vessel. Bilateral testing is considered an integral part of a complete examination. Limited examinations for reoccurring  indications may be performed as noted. The reflux portion of the exam is performed with the patient in reverse Trendelenburg.  +---------+---------------+---------+-----------+----------+-------------------+  RIGHT     Compressibility Phasicity Spontaneity Properties Thrombus Aging       +---------+---------------+---------+-----------+----------+-------------------+  CFV       Full            Yes       Yes                                         +---------+---------------+---------+-----------+----------+-------------------+  SFJ       Full                                                                  +---------+---------------+---------+-----------+----------+-------------------+  FV Prox   Full            Yes       Yes                                         +---------+---------------+---------+-----------+----------+-------------------+  FV Mid    Full            Yes       Yes                                         +---------+---------------+---------+-----------+----------+-------------------+  FV Distal Full            Yes       Yes                                         +---------+---------------+---------+-----------+----------+-------------------+  PFV       Full                                                                  +---------+---------------+---------+-----------+----------+-------------------+  POP       Full            Yes       Yes                                         +---------+---------------+---------+-----------+----------+-------------------+  PTV       Full                                                                  +---------+---------------+---------+-----------+----------+-------------------+  PERO      Full                                             Not well visualized  +---------+---------------+---------+-----------+----------+-------------------+   +--------+---------------+---------+-----------+----------+--------------------+  LEFT      Compressibility Phasicity Spontaneity Properties Thrombus Aging        +--------+---------------+---------+-----------+----------+--------------------+  CFV      Full            Yes       Yes                                          +--------+---------------+---------+-----------+----------+--------------------+  SFJ      Full                                                                   +--------+---------------+---------+-----------+----------+--------------------+  FV Prox  Full            Yes       Yes                                          +--------+---------------+---------+-----------+----------+--------------------+  FV Mid   Full            Yes       Yes                                          +--------+---------------+---------+-----------+----------+--------------------+  FV                       Yes       Yes                    patent by              Distal                                                    color/doppler         +--------+---------------+---------+-----------+----------+--------------------+  PFV      Full                                                                   +--------+---------------+---------+-----------+----------+--------------------+  POP      Full            Yes       Yes                                          +--------+---------------+---------+-----------+----------+--------------------+  PTV      Full                                             Not well visualized   +--------+---------------+---------+-----------+----------+--------------------+  PERO     Full                                             Not well visualized   +--------+---------------+---------+-----------+----------+--------------------+     Summary: BILATERAL: - No evidence of deep vein thrombosis seen in the lower extremities, bilaterally. - No evidence of superficial venous thrombosis in the lower extremities, bilaterally. -No evidence of popliteal cyst, bilaterally.   *See table(s)  above for measurements and observations. Electronically signed by Harold Barban MD on 10/04/2021 at 9:41:47 PM.    Final     Anti-infectives: Anti-infectives (From admission, onward)    Start     Dose/Rate Route Frequency Ordered Stop   09/30/21 1130  ceFAZolin (ANCEF) IVPB 3g/100 mL premix        3 g 200 mL/hr over 30 Minutes Intravenous On call to O.R. 09/30/21 0836 09/30/21 1205        Assessment/Plan POD6 s/p umbilical hernia repair with mesh - Dr. Michaelle Birks - no further emesis since yesterday AM, patient reports another small BM overnight - encourage smaller portions - abdominal film yesterday AM unremarkable without signs of obstruction - continue binder when OOB - continue mobilization  - stable for discharge from a surgical standpoint    FEN: soft diet, glucerna VTE: LMWH ID: ancef pre-op    Hypomagnesemia - Mg 1.6, replacement per primary  COPD on home oxygen HTN Chronic diastolic CHF I2ME Morbid obesity - BMI 49.24 Leg swelling and numbness - [place] was negative for DVT 1/10 Chronic arthritis of low back   LOS: 6 days   I reviewed nursing notes, hospitalist notes, last 24 h vitals and pain scores, last 48 h intake and output, and last 24 h labs and trends.  This care required low level of medical decision making.    Norm Parcel, [facility] 10/06/2021, 11:13 AM Please see Amion for pager number during day hours 7:00am-4:30pm

## 2021-10-07 ENCOUNTER — Other Ambulatory Visit: Payer: Self-pay

## 2021-10-08 DIAGNOSIS — J449 Chronic obstructive pulmonary disease, unspecified: Secondary | ICD-10-CM | POA: Diagnosis not present

## 2021-10-09 DIAGNOSIS — E119 Type 2 diabetes mellitus without complications: Secondary | ICD-10-CM | POA: Diagnosis not present

## 2021-10-09 DIAGNOSIS — Z794 Long term (current) use of insulin: Secondary | ICD-10-CM | POA: Diagnosis not present

## 2021-10-11 DIAGNOSIS — J449 Chronic obstructive pulmonary disease, unspecified: Secondary | ICD-10-CM | POA: Diagnosis not present

## 2021-10-11 DIAGNOSIS — G4733 Obstructive sleep apnea (adult) (pediatric): Secondary | ICD-10-CM | POA: Diagnosis not present

## 2021-11-11 ENCOUNTER — Other Ambulatory Visit: Payer: Self-pay

## 2021-11-11 ENCOUNTER — Ambulatory Visit: Payer: Self-pay | Admitting: Nurse Practitioner

## 2021-11-11 ENCOUNTER — Ambulatory Visit (INDEPENDENT_AMBULATORY_CARE_PROVIDER_SITE_OTHER): Payer: Medicare Other | Admitting: Nurse Practitioner

## 2021-11-11 ENCOUNTER — Encounter: Payer: Self-pay | Admitting: Nurse Practitioner

## 2021-11-11 DIAGNOSIS — E1165 Type 2 diabetes mellitus with hyperglycemia: Secondary | ICD-10-CM | POA: Diagnosis not present

## 2021-11-11 DIAGNOSIS — J0100 Acute maxillary sinusitis, unspecified: Secondary | ICD-10-CM | POA: Diagnosis not present

## 2021-11-11 DIAGNOSIS — Z794 Long term (current) use of insulin: Secondary | ICD-10-CM | POA: Diagnosis not present

## 2021-11-11 MED ORDER — FREESTYLE LIBRE 2 READER DEVI: 1.0000 "application " | Freq: Every day | 1 refills | Status: DC

## 2021-11-11 MED ORDER — AMOXICILLIN-POT CLAVULANATE 875-125 MG PO TABS: 1.0000 | ORAL_TABLET | Freq: Two times a day (BID) | ORAL | 0 refills | Status: AC

## 2021-11-11 MED ORDER — FREESTYLE LIBRE 2 SENSOR MISC: 1.0000 "application " | 11 refills | Status: DC

## 2021-11-11 NOTE — Progress Notes (Signed)
° °  [place], [place]  [address] Phone:  [phone]   Fax:  [phone] Virtual Visit via Telephone Note  I called  Sharon Swanson on 11/11/21 at  1:00 PM EST there was no answer and no opportunity to leave a message.   Virtual Visit via Telephone Note  I connected with Sharon Swanson on 11/11/21 at  1:00 PM EST by telephone and verified that I am speaking with the correct person using two identifiers.   I discussed the limitations, risks, security and privacy concerns of performing an evaluation and management service by telephone and the availability of in person appointments. I also discussed with the patient that there may be a patient responsible charge related to this service. The patient expressed understanding and agreed to proceed.  Patient home Provider Office  History of Present Illness:  Sharon Swanson  has a past medical history of Abnormal Pap smear of cervix (10/2020), Acid reflux, Asthma, Atypical squamous cells of undetermined significance (ASC-US) on cervical Pap smear (10/2020), Bilateral lower extremity edema (01/2020), COPD (chronic obstructive pulmonary disease) (HCC), CPAP (continuous positive airway pressure) dependence, Diabetes ([place]), Diabetes mellitus without complication ([place]), Fatty liver (11/08/2011), Fibroids (11/08/2011), Hyperglycemia, Hyperlipidemia (05/2020), Hypertension, Microalbuminuria (01/2020), Obesity (BMI 30-39.9) (11/08/2011), Osteoarthritis of right acromioclavicular joint, Pancreatitis (11/08/2011), Rotator cuff tear, right, Shortness of breath on exertion (01/2020), Sleep apnea, Ventral hernia, and Vitamin D deficiency (05/2020).   Upper Respiratory Infection Patient complains of symptoms of a URI. Symptoms include fever 101 and vomiting ; facial and ear pain on the right. She has swelling form ear to neck with nasal pain.  Onset of symptoms was 4 days ago, and has been the same since that time. Treatment  to date:  APAP no relief. She has tried an Curator and this caused stomach issues.  .   Review of Systems  Respiratory:  Positive for cough (occasional mucous). Negative for shortness of breath.   Cardiovascular:  Negative for chest pain.     Observations/Objective: No exam; telephone visit  Assessment and Plan: 1. Acute non-recurrent maxillary sinusitis - amoxicillin-clavulanate (AUGMENTIN) 875-125 MG tablet; Take 1 tablet by mouth 2 (two) times daily for 10 days.  Dispense: 20 tablet; Refill: 0  2. Type 2 diabetes mellitus with hyperglycemia, with long-term current use of insulin (HCC) - glipiZIDE (GLUCOTROL) 10 MG tablet; Take 1 tablet by mouth 2 (two) times daily. - Dulaglutide (TRULICITY) 4.09 BD/5.3GD SOPN; Inject 0.75 mg into the skin once a week. - Continuous Blood Gluc Sensor (FREESTYLE LIBRE 2 SENSOR) MISC; 1 application by Does not apply route every 14 (fourteen) days.  Dispense: 2 each; Refill: 11 - Continuous Blood Gluc Receiver (FREESTYLE LIBRE 2 READER) DEVI; Use as directed daily.  Dispense: 1 each; Refill: 1   I discussed the assessment and treatment plan with the patient. The patient was provided an opportunity to ask questions and all were answered. The patient agreed with the plan and demonstrated an understanding of the instructions.   The patient was advised to call back or seek an in-person evaluation if the symptoms worsen or if the condition fails to improve as anticipated.  I provided 11 minutes of telephone- visit time during this encounter.   Vevelyn Francois, NP

## 2021-11-11 NOTE — Patient Instructions (Signed)

## 2021-11-18 ENCOUNTER — Other Ambulatory Visit: Payer: Self-pay

## 2021-11-24 ENCOUNTER — Other Ambulatory Visit: Payer: Self-pay | Admitting: Nurse Practitioner

## 2021-11-24 ENCOUNTER — Other Ambulatory Visit: Payer: Self-pay

## 2021-11-24 DIAGNOSIS — R600 Localized edema: Secondary | ICD-10-CM

## 2021-11-24 DIAGNOSIS — E1165 Type 2 diabetes mellitus with hyperglycemia: Secondary | ICD-10-CM

## 2021-11-24 DIAGNOSIS — R7309 Other abnormal glucose: Secondary | ICD-10-CM

## 2021-11-24 DIAGNOSIS — R634 Abnormal weight loss: Secondary | ICD-10-CM

## 2021-11-24 DIAGNOSIS — Z794 Long term (current) use of insulin: Secondary | ICD-10-CM

## 2021-11-24 MED ORDER — SPIRONOLACTONE 25 MG PO TABS
ORAL_TABLET | Freq: Every day | ORAL | 0 refills | Status: DC
  Filled 2021-11-24 – 2021-12-06 (×2): qty 30, 30d supply, fill #0

## 2021-11-25 ENCOUNTER — Other Ambulatory Visit: Payer: Self-pay

## 2021-11-25 DIAGNOSIS — Z794 Long term (current) use of insulin: Secondary | ICD-10-CM

## 2021-11-25 DIAGNOSIS — E1165 Type 2 diabetes mellitus with hyperglycemia: Secondary | ICD-10-CM

## 2021-11-25 MED ORDER — ENTRESTO 97-103 MG PO TABS
1.0000 | ORAL_TABLET | Freq: Two times a day (BID) | ORAL | 2 refills | Status: AC
  Filled 2021-11-25 – 2021-12-06 (×2): qty 60, 30d supply, fill #0
  Filled 2022-05-09: qty 60, 30d supply, fill #1

## 2021-11-25 MED ORDER — GLIPIZIDE 10 MG PO TABS
10.0000 mg | ORAL_TABLET | Freq: Two times a day (BID) | ORAL | 0 refills | Status: DC
  Filled 2021-11-25 – 2021-12-06 (×2): qty 60, 30d supply, fill #0

## 2021-11-25 MED ORDER — PANTOPRAZOLE SODIUM 40 MG PO TBEC
40.0000 mg | DELAYED_RELEASE_TABLET | Freq: Every day | ORAL | 0 refills | Status: DC
  Filled 2021-11-25 – 2021-12-06 (×2): qty 30, 30d supply, fill #0

## 2021-11-25 MED ORDER — TRULICITY 0.75 MG/0.5ML ~~LOC~~ SOAJ
0.7500 mg | SUBCUTANEOUS | 0 refills | Status: AC
  Filled 2021-11-25: qty 6, 84d supply, fill #0

## 2021-11-28 ENCOUNTER — Other Ambulatory Visit: Payer: Self-pay

## 2021-11-29 ENCOUNTER — Telehealth: Payer: Self-pay

## 2021-11-29 NOTE — Telephone Encounter (Signed)
Patient call stating she had severe pain in her back. Spoke with patient and she stated that the arthritis in her back is causing her pain. She stated it is hard to get up or down and is rating it 10/10. She wants to know if something can be done before her appointment on 12/07/21. Please advise ?

## 2021-11-30 ENCOUNTER — Encounter: Payer: Self-pay | Admitting: *Deleted

## 2021-11-30 NOTE — Telephone Encounter (Signed)
Opened in error

## 2021-11-30 NOTE — Telephone Encounter (Signed)
Mrs Sharon Swanson called back because she is in severe pain. ?

## 2021-12-01 ENCOUNTER — Other Ambulatory Visit: Payer: Self-pay

## 2021-12-01 MED ORDER — PREDNISONE 10 MG PO TABS: 40.0000 mg | ORAL_TABLET | Freq: Every day | ORAL | 0 refills | Status: DC

## 2021-12-01 NOTE — Addendum Note (Signed)
Addended by: Courtney Heys on: 12/01/2021 11:40 AM ? ? Modules accepted: Orders ? ?

## 2021-12-01 NOTE — Telephone Encounter (Signed)
?  Pt reports pain 10/10-  ?I think she's having flare of pain; ?Usually prescribe steroids in this situation to calm things down, however she is diabetic.  ?Will do steroid taper but explained to pt will cause her CBGs/blood sugars to go up and needs to control them as much as possible while on steroids.  ? ?Will do Prednisone 40 mg x 3 days, then 20 mg x 3 days, then 10 mg x 3 days, then 5 mg x 3 days, then stop ?Have appt 3/15 so will be able to f/u next week and see how she's doing.  ? ?Pt says called 1 month ago- but didn't see message in computer- explained if I don't call, please call back, because I might miss a call, but will call back as soon as possible.  ? ?Still taking Cymbalta.  ?

## 2021-12-01 NOTE — Telephone Encounter (Signed)
Opened in error

## 2021-12-05 ENCOUNTER — Other Ambulatory Visit: Payer: Self-pay

## 2021-12-06 ENCOUNTER — Other Ambulatory Visit: Payer: Self-pay

## 2021-12-07 ENCOUNTER — Encounter: Payer: Self-pay | Admitting: Physical Medicine and Rehabilitation

## 2021-12-07 ENCOUNTER — Encounter
Payer: Medicare Other | Attending: Physical Medicine and Rehabilitation | Admitting: Physical Medicine and Rehabilitation

## 2021-12-07 ENCOUNTER — Other Ambulatory Visit: Payer: Self-pay

## 2021-12-07 VITALS — BP 162/88 | HR 87 | Ht 67.0 in | Wt 290.0 lb

## 2021-12-07 DIAGNOSIS — Z5181 Encounter for therapeutic drug level monitoring: Secondary | ICD-10-CM | POA: Diagnosis not present

## 2021-12-07 DIAGNOSIS — M5416 Radiculopathy, lumbar region: Secondary | ICD-10-CM | POA: Diagnosis not present

## 2021-12-07 DIAGNOSIS — Z794 Long term (current) use of insulin: Secondary | ICD-10-CM | POA: Diagnosis not present

## 2021-12-07 DIAGNOSIS — G894 Chronic pain syndrome: Secondary | ICD-10-CM | POA: Insufficient documentation

## 2021-12-07 DIAGNOSIS — Z79891 Long term (current) use of opiate analgesic: Secondary | ICD-10-CM | POA: Insufficient documentation

## 2021-12-07 DIAGNOSIS — E119 Type 2 diabetes mellitus without complications: Secondary | ICD-10-CM | POA: Diagnosis not present

## 2021-12-07 DIAGNOSIS — M48062 Spinal stenosis, lumbar region with neurogenic claudication: Secondary | ICD-10-CM | POA: Diagnosis not present

## 2021-12-07 MED ORDER — DULOXETINE HCL 60 MG PO CPEP: 60.0000 mg | ORAL_CAPSULE | Freq: Every day | ORAL | 5 refills | Status: DC

## 2021-12-07 MED ORDER — TRAMADOL HCL 50 MG PO TABS: 50.0000 mg | ORAL_TABLET | Freq: Four times a day (QID) | ORAL | 0 refills | Status: DC | PRN

## 2021-12-07 NOTE — Patient Instructions (Signed)
Pt is a 61 yr old female with morbid obesity- BMI of 48- weight 311 lbs; with DM- last A1c 5 months ago was 6.4; on Home 3L- of O2 [place]- for a few years- COPD; still actively smoking- 2 cigarettes/day. HTN;  Has kidney doctor- CKD stage II?;  ?Here for evaluation of chronic back pain.  ?Current weight 290 lbs.  ? ?Had run out of Cymbalta/Duloxetine- so will restart 60 mg daily.  Sent in 1 pill/day- 60 mg- same dose- For nerve pain.  ? ?2.  Will see if Dr Letta Pate can do epidural steroid injections. Pain radiates down R leg to knee- Thinking likely R L2/3 to start, but depends on what Dr Letta Pate thinks- Last A1c 6.4, so can do epidural ESI- not on any blood thinners.  ? ?3. Will do Opiate contract and UDS ? ?4. Tramadol- 50 mg q6 hours as needed- last Rx in January- for hernia surgery- can also take 2 pills 2x/day- and take with tylenol- it makes tramadol last longer.  ? ?5. Discussed options for pain- will try to get better control of pain- finish Prednisone dosing, but will not refill at this time.  ? ?6. Proud of her for losing more weight.  ? ? ?7. Want her to focus on pedal bike as pain gets better controlled.  ? ? ?8. F/U in 61month- call in 1 month to let me know how things going ? ?9. [facility] ?

## 2021-12-07 NOTE — Addendum Note (Signed)
Addended by: Jasmine December T on: 12/07/2021 10:31 AM ? ? Modules accepted: Orders ? ?

## 2021-12-07 NOTE — Progress Notes (Signed)
? ?Subjective:  ? ? Patient ID: Sharon Swanson, female    DOB: October 26, 1960, 61 y.o.   MRN: [id number] ? ?HPI ?Pt is a 61 yr old female with morbid obesity- BMI of 48- weight 311 lbs; with DM- last A1c 5 months ago was 6.4; on Home 3L- of O2 [place]- for a few years- COPD; still actively smoking- 2 cigarettes/day. HTN;  Has kidney doctor- CKD stage II?;  ?Here for evaluation of chronic back pain.  ?Weight down to 290 lbs today! ? ? ?Had incarcerated hernia in January 2023- had emergency surgery.  ?Incision is healed on outside per pt.  ? ?Bowels working OK- occ constipation- but not like used to be prior to surgery.  ? ? ?Called in Prednisone ?1 week ago-  ?Sent in Prednisone 40 mg daily x 3 days, then 20 mg daily x 3 days, then 10 mg x 3 days; 5 mg daily x 3 days.  ? ?On 2 tabs daily right now.  ?Pain going back up- on 20 mg daily- ? ? ?Still has nerve pain and throbbing pain- ? ?Tried Tramadol after hernia surgery - was helpful also for back pain.  ? ? ? ? ?Pain Inventory ?Average Pain 10 ?Pain Right Now 9 ?My pain is sharp and aching ? ?In the last 24 hours, has pain interfered with the following? ?General activity 10 ?Relation with others 10 ?Enjoyment of life 8 ?What TIME of day is your pain at its worst? morning  and night ?Sleep (in general) Fair ? ?Pain is worse with: walking, bending, sitting, and standing ?Pain improves with: medication ?Relief from Meds: 9 ? ?Family History  ?Problem Relation Age of Onset  ? Hypertension Mother   ? Diabetes Other   ? Diabetes Maternal Grandmother   ? Hypertension Maternal Grandmother   ? ?Social History  ? ?Socioeconomic History  ? Marital status: Married  ?  Spouse name: Not on file  ? Number of children: Not on file  ? Years of education: Not on file  ? Highest education level: Not on file  ?Occupational History  ? Not on file  ?Tobacco Use  ? Smoking status: Every Day  ?  Packs/day: 0.10  ?  Years: 39.00  ?  Pack years: 3.90  ?  Types: Cigarettes  ?  Start date: 09/25/1980  ?  Smokeless tobacco: Never  ? Tobacco comments:  ?  pt states she smoke 2 cigerettes a day. States she uses nicotine patches.  ?Vaping Use  ? Vaping Use: Never used  ?Substance and Sexual Activity  ? Alcohol use: No  ? Drug use: No  ? Sexual activity: Not Currently  ?  Partners: Male  ?Other Topics Concern  ? Not on file  ?Social History Narrative  ? Not on file  ? ?Social Determinants of Health  ? ?Financial Resource Strain: Medium Risk  ? Difficulty of Paying Living Expenses: Somewhat hard  ?Food Insecurity: No Food Insecurity  ? Worried About Charity fundraiser in the Last Year: Never true  ? Ran Out of Food in the Last Year: Never true  ?Transportation Needs: No Transportation Needs  ? Lack of Transportation (Medical): No  ? Lack of Transportation (Non-Medical): No  ?Physical Activity: Inactive  ? Days of Exercise per Week: 0 days  ? Minutes of Exercise per Session: 0 min  ?Stress: Stress Concern Present  ? Feeling of Stress : To some extent  ?Social Connections: Socially Integrated  ? Frequency of Communication with Friends and  Family: Three times a week  ? Frequency of Social Gatherings with Friends and Family: Three times a week  ? Attends Religious Services: More than 4 times per year  ? Active Member of Clubs or Organizations: Yes  ? Attends Archivist Meetings: 1 to 4 times per year  ? Marital Status: Married  ? ?Past Surgical History:  ?Procedure Laterality Date  ? ORTHOPEDIC SURGERY    ? R ankle, tendonitis  ? UMBILICAL HERNIA REPAIR N/A 09/30/2021  ? Procedure: INCARCERATED HERNIA REPAIR UMBILICAL ADULT;  Surgeon: Dwan Bolt, MD;  Location: [facility];  Service: General;  Laterality: N/A;  ? ?Past Surgical History:  ?Procedure Laterality Date  ? ORTHOPEDIC SURGERY    ? R ankle, tendonitis  ? UMBILICAL HERNIA REPAIR N/A 09/30/2021  ? Procedure: INCARCERATED HERNIA REPAIR UMBILICAL ADULT;  Surgeon: Dwan Bolt, MD;  Location: [facility];  Service: General;  Laterality: N/A;  ? ?Past Medical History:   ?Diagnosis Date  ? Abnormal Pap smear of cervix 10/2020  ? Acid reflux   ? Asthma   ? Atypical squamous cells of undetermined significance (ASC-US) on cervical Pap smear 10/2020  ? Bilateral lower extremity edema 01/2020  ? COPD (chronic obstructive pulmonary disease) ([place])   ? CPAP (continuous positive airway pressure) dependence   ? Diabetes ([place])   ? Diabetes mellitus without complication ([place])   ? Type II  ? Fatty liver 11/08/2011  ? Fibroids 11/08/2011  ? Hyperglycemia   ? Hyperlipidemia 05/2020  ? Hypertension   ? Microalbuminuria 01/2020  ? Obesity (BMI 30-39.9) 11/08/2011  ? Osteoarthritis of right acromioclavicular joint   ? Pancreatitis 11/08/2011  ? Rotator cuff tear, right   ? Shortness of breath on exertion 01/2020  ? Sleep apnea   ? Ventral hernia   ? Vitamin D deficiency 05/2020  ? ?BP (!) 162/88   Pulse 87   Ht '5\' 7"'$  (1.702 m)   Wt 290 lb (131.5 kg)   SpO2 100%   BMI 45.42 kg/m?  ? ?Opioid Risk Score:   ?Fall Risk Score:  `1 ? ?Depression screen PHQ 2/9 ? ?Depression screen [facility] 2/9 12/07/2021 11/11/2021 09/07/2021 06/08/2021 06/28/2020 05/28/2020 02/25/2020  ?Decreased Interest 0 0 3 0 0 0 0  ?Down, Depressed, Hopeless 0 0 0 0 0 0 0  ?PHQ - 2 Score 0 0 3 0 0 0 0  ?Altered sleeping - - 3 - - - -  ?Tired, decreased energy - - 3 - - - -  ?Change in appetite - - 2 - - - -  ?Feeling bad or failure about yourself  - - 0 - - - -  ?Trouble concentrating - - 0 - - - -  ?Moving slowly or fidgety/restless - - 2 - - - -  ?Suicidal thoughts - - 0 - - - -  ?PHQ-9 Score - - 13 - - - -  ?  ?Review of Systems  ?Musculoskeletal:  Positive for back pain.  ?All other systems reviewed and are negative. ? ?   ?Objective:  ? Physical Exam ? ?Awake, alert, appropriate; has lost 20+ pounds since last visit; NAD ?TTP over R paraspinals-  ?(+) SLR on R ?MS: ?RLE- HF 4+/5- still today- weakness vs (+) SLR ? ? ?Lumbar MRI 4/22 ?  IMPRESSION: ?1. L4-5: Broad-based disc herniation more prominent towards the ?right. Moderate  multifactorial canal stenosis. Bilateral lateral ?recess stenosis right worse than left with likely neural ?compression, particularly on right. ?2. L2-3 and  L3-4: Disc protrusions. Moderate multifactorial stenosis ?at both of those levels that could be symptomatic. ?3. L5-S1: Shallow disc protrusion more towards the left but without ?apparent compressive stenosis. ?Assessment & Plan:  ? ?Pt is a 61 yr old female with morbid obesity- BMI of 48- weight 311 lbs; with DM- last A1c 5 months ago was 6.4; on Home 3L- of O2 [place]- for a few years- COPD; still actively smoking- 2 cigarettes/day. HTN;  Has kidney doctor- CKD stage II?;  ?Here for evaluation of chronic back pain.  ?Current weight 290 lbs.  ? ?Had run out of Cymbalta/Duloxetine- so will restart 60 mg daily.  Sent in 1 pill/day- 60 mg- same dose- For nerve pain.  ? ?2.  Will see if Dr Letta Pate can do epidural steroid injections. Pain radiates down R leg to knee- Thinking likely R L2/3 to start, but depends on what Dr Letta Pate thinks- Last A1c 6.4, so can do epidural ESI- not on any blood thinners.  ? ?3. Will do Opiate contract and UDS ? ?4. Tramadol- 50 mg q6 hours as needed- last Rx in January- for hernia surgery- can also take 2 pills 2x/day- and take with tylenol- it makes tramadol last longer.  ? ?5. Discussed options for pain- will try to get better control of pain- finish Prednisone dosing, but will not refill at this time.  ? ?6. Proud of her for losing more weight.  ? ? ?7. Want her to focus on pedal bike as pain gets better controlled. Will truly help pain in the long run and weight loss.  ? ? ?8. F/U in 3 months- call in 1 month to let me know how things going ? ?9. Appt with Dr Letta Pate for [facility] suggest R L2/3 ? ? ? ?

## 2021-12-13 LAB — TOXASSURE SELECT,+ANTIDEPR,UR

## 2021-12-14 ENCOUNTER — Telehealth: Payer: Self-pay | Admitting: *Deleted

## 2021-12-14 NOTE — Telephone Encounter (Signed)
Urine drug screen negative for medications. ?

## 2021-12-20 DIAGNOSIS — J449 Chronic obstructive pulmonary disease, unspecified: Secondary | ICD-10-CM | POA: Diagnosis not present

## 2022-01-05 DIAGNOSIS — E119 Type 2 diabetes mellitus without complications: Secondary | ICD-10-CM | POA: Diagnosis not present

## 2022-01-05 DIAGNOSIS — Z794 Long term (current) use of insulin: Secondary | ICD-10-CM | POA: Diagnosis not present

## 2022-01-13 DIAGNOSIS — J449 Chronic obstructive pulmonary disease, unspecified: Secondary | ICD-10-CM | POA: Diagnosis not present

## 2022-01-19 ENCOUNTER — Encounter: Payer: Medicare Other | Admitting: Physical Medicine & Rehabilitation

## 2022-01-19 ENCOUNTER — Encounter: Payer: Self-pay | Admitting: Physical Medicine & Rehabilitation

## 2022-01-25 ENCOUNTER — Other Ambulatory Visit: Payer: Self-pay

## 2022-02-04 DIAGNOSIS — E119 Type 2 diabetes mellitus without complications: Secondary | ICD-10-CM | POA: Diagnosis not present

## 2022-02-04 DIAGNOSIS — Z794 Long term (current) use of insulin: Secondary | ICD-10-CM | POA: Diagnosis not present

## 2022-02-15 ENCOUNTER — Ambulatory Visit (INDEPENDENT_AMBULATORY_CARE_PROVIDER_SITE_OTHER): Payer: Medicare Other | Admitting: Nurse Practitioner

## 2022-02-15 ENCOUNTER — Encounter: Payer: Self-pay | Admitting: Nurse Practitioner

## 2022-02-15 ENCOUNTER — Other Ambulatory Visit: Payer: Self-pay

## 2022-02-15 VITALS — BP 186/97 | HR 90 | Temp 97.9°F | Ht 66.0 in | Wt 288.6 lb

## 2022-02-15 DIAGNOSIS — I5032 Chronic diastolic (congestive) heart failure: Secondary | ICD-10-CM | POA: Diagnosis not present

## 2022-02-15 DIAGNOSIS — Z794 Long term (current) use of insulin: Secondary | ICD-10-CM | POA: Diagnosis not present

## 2022-02-15 DIAGNOSIS — I10 Essential (primary) hypertension: Secondary | ICD-10-CM | POA: Diagnosis not present

## 2022-02-15 DIAGNOSIS — E1165 Type 2 diabetes mellitus with hyperglycemia: Secondary | ICD-10-CM

## 2022-02-15 LAB — POCT GLYCOSYLATED HEMOGLOBIN (HGB A1C)
HbA1c POC (<> result, manual entry): 7.3 % (ref 4.0–5.6)
HbA1c, POC (controlled diabetic range): 7.3 % — AB (ref 0.0–7.0)
HbA1c, POC (prediabetic range): 7.3 % — AB (ref 5.7–6.4)
Hemoglobin A1C: 7.3 % — AB (ref 4.0–5.6)

## 2022-02-15 MED ORDER — AMLODIPINE BESYLATE 10 MG PO TABS: 10.0000 mg | ORAL_TABLET | Freq: Every day | ORAL | 0 refills | Status: DC

## 2022-02-15 MED ORDER — AMLODIPINE BESYLATE 10 MG PO TABS
10.0000 mg | ORAL_TABLET | Freq: Every day | ORAL | 0 refills | Status: DC
  Filled 2022-02-15: qty 90, 90d supply, fill #0

## 2022-02-15 NOTE — Patient Instructions (Signed)
You were seen today in the [facility] ([facility]) for reevaluation of HTN and DM. Labs were collected, results will be available via MyChart or, if abnormal, you will be contacted by clinic staff. You were prescribed medications, please take as directed. Please follow up in 3 mths for reevaluation of HTN

## 2022-02-15 NOTE — Progress Notes (Signed)
[place] [address], [place]  [address] Phone:  [phone]   Fax:  [phone] Subjective:   Patient ID: Sharon Swanson, female    DOB: 08-05-1961, 61 y.o.   MRN: [id number]  Chief Complaint  Patient presents with   Follow-up    Patient is here today for her follow up visit and to discuss her blood pressure and A1c being elevated for over a month now. Patient states she is also having blurred vision with headaches, dizziness and nausea daily.   HPI Michie Molnar 61 y.o. female  has a past medical history of Abnormal Pap smear of cervix (10/2020), Acid reflux, Asthma, Atypical squamous cells of undetermined significance (ASC-US) on cervical Pap smear (10/2020), Bilateral lower extremity edema (01/2020), COPD (chronic obstructive pulmonary disease) (HCC), CPAP (continuous positive airway pressure) dependence, Diabetes ([place]), Diabetes mellitus without complication ([place]), Fatty liver (11/08/2011), Fibroids (11/08/2011), Hyperglycemia, Hyperlipidemia (05/2020), Hypertension, Microalbuminuria (01/2020), Obesity (BMI 30-39.9) (11/08/2011), Osteoarthritis of right acromioclavicular joint, Pancreatitis (11/08/2011), Rotator cuff tear, right, Shortness of breath on exertion (01/2020), Sleep apnea, Ventral hernia, and Vitamin D deficiency (05/2020). To the [facility] for reevaluation of chronic illness.   Hypertension: Patient here for follow-up of elevated blood pressure. She is exercising and is adherent to low salt diet.  Blood pressure is not well controlled at home. Cardiac symptoms fatigue. Patient denies chest pain, chest pressure/discomfort, and palpitations.  Cardiovascular risk factors: diabetes mellitus, hypertension, and obesity (BMI >= 30 kg/m2). Use of agents associated with hypertension: none. History of target organ damage: none. States that her B/P has been elevated for the past month, 202/190. Uses a peddle bike at home to assist with weight loss.   Patient  states that she has been feeling tired, weak and off balanced, suspects it is related to elevated B/P. Currently has heart failure and being managed by cardiologist. Last appointment with cardiologist 1 yr ago.   Diabetes Mellitus: Patient presents for follow up of diabetes. Symptoms: none. . Patient denies foot ulcerations, hypoglycemia , nausea, and paresthesia of the feet.  Evaluation to date has been included: hemoglobin A1C.  Home sugars: BGs range between 123 and 180 . Treatment to date: no recent interventions.   Patient states that she recently started getting care from the pain management, and was started on Tramadol for back pain. Suspects medication may be causing her to feel "loopy" and she may need it to be changed.   Denies any other concerns today. Denies any fatigue, chest pain, shortness of breath, HA or dizziness. Denies any blurred vision, numbness or tingling.   Past Medical History:  Diagnosis Date   Abnormal Pap smear of cervix 10/2020   Acid reflux    Asthma    Atypical squamous cells of undetermined significance (ASC-US) on cervical Pap smear 10/2020   Bilateral lower extremity edema 01/2020   COPD (chronic obstructive pulmonary disease) (HCC)    CPAP (continuous positive airway pressure) dependence    Diabetes ([place])    Diabetes mellitus without complication ([place])    Type II   Fatty liver 11/08/2011   Fibroids 11/08/2011   Hyperglycemia    Hyperlipidemia 05/2020   Hypertension    Microalbuminuria 01/2020   Obesity (BMI 30-39.9) 11/08/2011   Osteoarthritis of right acromioclavicular joint    Pancreatitis 11/08/2011   Rotator cuff tear, right    Shortness of breath on exertion 01/2020   Sleep apnea    Ventral hernia    Vitamin D deficiency 05/2020  Past Surgical History:  Procedure Laterality Date   ORTHOPEDIC SURGERY     R ankle, tendonitis   UMBILICAL HERNIA REPAIR N/A 09/30/2021   Procedure: INCARCERATED HERNIA REPAIR UMBILICAL ADULT;  Surgeon: Dwan Bolt, MD;  Location: [facility];  Service: General;  Laterality: N/A;    Family History  Problem Relation Age of Onset   Hypertension Mother    Diabetes Other    Diabetes Maternal Grandmother    Hypertension Maternal Grandmother     Social History   Socioeconomic History   Marital status: Married    Spouse name: Not on file   Number of children: Not on file   Years of education: Not on file   Highest education level: Not on file  Occupational History   Not on file  Tobacco Use   Smoking status: Every Day    Packs/day: 0.10    Years: 39.00    Pack years: 3.90    Types: Cigarettes    Start date: 09/25/1980   Smokeless tobacco: Never   Tobacco comments:    pt states she smoke 2 cigerettes a day. States she uses nicotine patches.  Vaping Use   Vaping Use: Never used  Substance and Sexual Activity   Alcohol use: No   Drug use: No   Sexual activity: Not Currently    Partners: Male  Other Topics Concern   Not on file  Social History Narrative   Not on file   Social Determinants of Health   Financial Resource Strain: Medium Risk   Difficulty of Paying Living Expenses: Somewhat hard  Food Insecurity: No Food Insecurity   Worried About Running Out of Food in the Last Year: Never true   Ran Out of Food in the Last Year: Never true  Transportation Needs: No Transportation Needs   Lack of Transportation (Medical): No   Lack of Transportation (Non-Medical): No  Physical Activity: Inactive   Days of Exercise per Week: 0 days   Minutes of Exercise per Session: 0 min  Stress: Stress Concern Present   Feeling of Stress : To some extent  Social Connections: Socially Integrated   Frequency of Communication with Friends and Family: Three times a week   Frequency of Social Gatherings with Friends and Family: Three times a week   Attends Religious Services: More than 4 times per year   Active Member of Clubs or Organizations: Yes   Attends Archivist Meetings: 1 to 4  times per year   Marital Status: Married  Human resources officer Violence: Not At Risk   Fear of Current or Ex-Partner: No   Emotionally Abused: No   Physically Abused: No   Sexually Abused: No    Outpatient Medications Prior to Visit  Medication Sig Dispense Refill   acetaminophen (TYLENOL) 325 MG tablet Take 2 tablets (650 mg total) by mouth every 6 (six) hours as needed.     albuterol (VENTOLIN HFA) 108 (90 Base) MCG/ACT inhaler USE 2 INHALATIONS BY MOUTH  INTO THE LUNGS EVERY 4  HOURS AS NEEDED FOR  WHEEZING OR SHORTNESS OF  BREATH (Patient taking differently: 2 puffs every 4 (four) hours as needed for wheezing or shortness of breath.) 51 g 3   albuterol (VENTOLIN HFA) 108 (90 Base) MCG/ACT inhaler Inhale 2 puffs into the lungs every 4 (four) hours as needed.     atorvastatin (LIPITOR) 20 MG tablet Take 1 tablet (20 mg total) by mouth daily. 90 tablet 3   carvedilol (COREG) 12.5 MG  tablet Take 1 tablet (12.5 mg total) by mouth 2 (two) times daily. 180 tablet 3   cetirizine (ZYRTEC) 10 MG tablet Take 1 tablet (10 mg total) by mouth in the morning. (Patient taking differently: Take 10 mg by mouth daily as needed for allergies.) 90 tablet 3   Continuous Blood Gluc Receiver (FREESTYLE LIBRE 2 READER) DEVI Use as directed daily. 1 each 1   Continuous Blood Gluc Sensor (FREESTYLE LIBRE 2 SENSOR) MISC 1 application by Does not apply route every 14 (fourteen) days. 2 each 11   Continuous Blood Gluc Transmit (DEXCOM G6 TRANSMITTER) MISC Use as directed 6 each 3   Dulaglutide (TRULICITY) 6.04 VW/0.9WJ SOPN Inject 0.75 mg into the skin once a week. 6 mL 0   DULoxetine (CYMBALTA) 60 MG capsule Take 1 capsule (60 mg total) by mouth daily. 30 capsule 5   ENTRESTO 97-103 MG Take 1 tablet by mouth 2 (two) times daily. 60 tablet 2   ergocalciferol (VITAMIN D2) 1.25 MG (50000 UT) capsule Take by mouth.     fluticasone (FLONASE) 50 MCG/ACT nasal spray Place 1 spray into both nostrils daily.     furosemide  (LASIX) 40 MG tablet Take 1 tablet (40 mg total) by mouth 2 (two) times daily. 180 tablet 3   gabapentin (NEURONTIN) 300 MG capsule Take 1 capsule (300 mg total) by mouth 3 (three) times daily. 270 capsule 3   glipiZIDE (GLUCOTROL) 10 MG tablet Take 1 tablet (10 mg total) by mouth 2 (two) times daily. 60 tablet 0   glucose monitoring kit (FREESTYLE) monitoring kit 1 each by Does not apply route as needed for other. 1 each 0   insulin aspart (NOVOLOG) 100 UNIT/ML injection Inject 0-15 Units into the skin 3 (three) times daily with meals. Sliding Scale as noted below CBG <70: treat low blood sugar CBG 70-120: 0 units CBG 121-150: 2 units novolog CBG 151-200: 3 units novolog CBG 201-250: 5 units novolog CBG 251-300: 8 units novolog CBG 301-350: 11 units novolog CBG 351-400: 15 units novology CBG >400: call MD 10 mL 11   insulin aspart protamine- aspart (NOVOLOG MIX 70/30) (70-30) 100 UNIT/ML injection INJECT 0.2 MLS (20 UNITS TOTAL) INTO THE SKIN 2 (TWO) TIMES DAILY WITH A MEAL.     insulin glargine (LANTUS) 100 UNIT/ML injection Inject 0.15 mLs (15 Units total) into the skin at bedtime.     losartan (COZAAR) 100 MG tablet Take by mouth.     metFORMIN (GLUCOPHAGE) 1000 MG tablet Take 1 tablet (1,000 mg total) by mouth 2 (two) times daily with a meal. 180 tablet 3   methocarbamol (ROBAXIN) 750 MG tablet Take 1 tablet (750 mg total) by mouth 3 (three) times daily as needed for muscle spasms. 270 tablet 3   montelukast (SINGULAIR) 10 MG tablet Take 1 tablet (10 mg total) by mouth at bedtime. 90 tablet 3   ondansetron (ZOFRAN) 4 MG tablet Take 1 tablet (4 mg total) by mouth every 8 (eight) hours as needed for nausea or vomiting. 60 tablet 0   pantoprazole (PROTONIX) 40 MG tablet Take 1 tablet (40 mg total) by mouth daily. 30 tablet 0   spironolactone (ALDACTONE) 25 MG tablet TAKE 1 TABLET (25 MG TOTAL) BY MOUTH DAILY. 30 tablet 0   Tiotropium Bromide-Olodaterol (STIOLTO RESPIMAT) 2.5-2.5 MCG/ACT AERS  Inhale 2 puffs into the lungs daily. 12 g 3   traMADol (ULTRAM) 50 MG tablet Take 1 tablet (50 mg total) by mouth every 6 (six) hours as needed  for moderate pain or severe pain. 120 tablet 0   docusate sodium (COLACE) 100 MG capsule Take 1 capsule (100 mg total) by mouth 3 (three) times daily as needed for mild constipation. 270 capsule 3   ipratropium-albuterol (DUONEB) 0.5-2.5 (3) MG/3ML SOLN Take 3 mLs by nebulization every 6 (six) hours as needed (shortness of breath, wheeze). 360 mL 12   predniSONE (DELTASONE) 10 MG tablet Take 4 tablets (40 mg total) by mouth daily with breakfast. 40 mg daily x 3 days, then 20 mg x 3 days then 10 mg x 3 days, then 5 mg x 3 days (Patient not taking: Reported on 02/15/2022) 24 tablet 0   No facility-administered medications prior to visit.    Allergies  Allergen Reactions   Aspirin Other (See Comments)    Abdominal pain Abdominal pain Abdominal pain Abdominal pain Abdominal pain Abdominal pain Abdominal pain    Review of Systems  Constitutional:  Positive for malaise/fatigue. Negative for chills and fever.  HENT: Negative.    Eyes:  Positive for blurred vision.  Respiratory:  Negative for cough and shortness of breath.   Cardiovascular:  Negative for chest pain, palpitations and leg swelling.  Gastrointestinal:  Negative for abdominal pain, blood in stool, constipation, diarrhea, nausea and vomiting.  Musculoskeletal: Negative.   Skin: Negative.   Neurological:  Positive for dizziness and weakness. Negative for tingling, tremors, sensory change, speech change, focal weakness, seizures, loss of consciousness and headaches.  Psychiatric/Behavioral:  Negative for depression. The patient is not nervous/anxious.   All other systems reviewed and are negative.     Objective:    Physical Exam Vitals reviewed.  Constitutional:      General: She is not in acute distress.    Appearance: Normal appearance. She is obese.  HENT:     Head:  Normocephalic.     Right Ear: Tympanic membrane, ear canal and external ear normal. There is no impacted cerumen.     Left Ear: Tympanic membrane, ear canal and external ear normal. There is no impacted cerumen.     Nose: Nose normal. No congestion or rhinorrhea.     Mouth/Throat:     Mouth: Mucous membranes are moist.     Pharynx: Oropharynx is clear. No posterior oropharyngeal erythema.  Eyes:     General: No scleral icterus.       Right eye: No discharge.        Left eye: No discharge.     Extraocular Movements: Extraocular movements intact.     Conjunctiva/sclera: Conjunctivae normal.     Pupils: Pupils are equal, round, and reactive to light.  Neck:     Vascular: No carotid bruit.  Cardiovascular:     Rate and Rhythm: Normal rate and regular rhythm.     Pulses: Normal pulses.     Heart sounds: Normal heart sounds.     Comments: No obvious peripheral edema Pulmonary:     Effort: Pulmonary effort is normal.     Breath sounds: Normal breath sounds.  Musculoskeletal:        General: No swelling, tenderness, deformity or signs of injury. Normal range of motion.     Cervical back: Normal range of motion and neck supple. No rigidity or tenderness.     Right lower leg: No edema.     Left lower leg: No edema.  Lymphadenopathy:     Cervical: No cervical adenopathy.  Skin:    General: Skin is warm and dry.     Capillary  Refill: Capillary refill takes less than 2 seconds.  Neurological:     General: No focal deficit present.     Mental Status: She is alert and oriented to person, place, and time.  Psychiatric:        Mood and Affect: Mood normal.        Behavior: Behavior normal.        Thought Content: Thought content normal.        Judgment: Judgment normal.    BP (!) 186/97   Pulse 90   Temp 97.9 F (36.6 C)   Ht 5' 6"  (1.676 m)   Wt 288 lb 9.6 oz (130.9 kg)   SpO2 99%   BMI 46.58 kg/m  Wt Readings from Last 3 Encounters:  02/15/22 288 lb 9.6 oz (130.9 kg)  01/19/22  284 lb (128.8 kg)  12/07/21 290 lb (131.5 kg)    Immunization History  Administered Date(s) Administered   DTaP 05/28/2020   Influenza,inj,Quad PF,6+ Mos 08/18/2014, 06/20/2016, 05/28/2020   Influenza-Unspecified 08/18/2014, 06/19/2016   Moderna Sars-Covid-2 Vaccination 12/12/2019, 01/13/2020   Pneumococcal Polysaccharide-23 08/18/2014, 04/25/2016, 06/28/2020   Pneumococcal-Unspecified 05/29/2016    Diabetic Foot Exam - Simple   No data filed     Lab Results  Component Value Date   TSH 1.060 10/04/2020   Lab Results  Component Value Date   WBC 3.5 (L) 10/06/2021   HGB 11.6 (L) 10/06/2021   HCT 37.5 10/06/2021   MCV 93.5 10/06/2021   PLT 232 10/06/2021   Lab Results  Component Value Date   NA 139 10/06/2021   K 3.6 10/06/2021   CO2 33 (H) 10/06/2021   GLUCOSE 129 (H) 10/06/2021   BUN 10 10/06/2021   CREATININE 0.66 10/06/2021   BILITOT 0.5 10/06/2021   ALKPHOS 73 10/06/2021   AST 51 (H) 10/06/2021   ALT 59 (H) 10/06/2021   PROT 7.0 10/06/2021   ALBUMIN 3.4 (L) 10/06/2021   CALCIUM 8.8 (L) 10/06/2021   ANIONGAP 9 10/06/2021   EGFR 87 02/02/2021   GFR 128.72 03/01/2020   Lab Results  Component Value Date   CHOL 278 (H) 02/15/2022   CHOL 171 10/04/2020   CHOL 250 (H) 05/28/2020   Lab Results  Component Value Date   HDL 80 02/15/2022   HDL 60 10/04/2020   HDL 60 05/28/2020   Lab Results  Component Value Date   LDLCALC 170 (H) 02/15/2022   LDLCALC 90 10/04/2020   LDLCALC 162 (H) 05/28/2020   Lab Results  Component Value Date   TRIG 155 (H) 02/15/2022   TRIG 121 10/04/2020   TRIG 157 (H) 05/28/2020   Lab Results  Component Value Date   CHOLHDL 3.5 02/15/2022   CHOLHDL 2.9 10/04/2020   CHOLHDL 4.2 05/28/2020   Lab Results  Component Value Date   HGBA1C 7.3 (A) 02/15/2022   HGBA1C 7.3 02/15/2022   HGBA1C 7.3 (A) 02/15/2022   HGBA1C 7.3 (A) 02/15/2022       Assessment & Plan:   Problem List Items Addressed This Visit        Cardiovascular and Mediastinum   Essential hypertension   Relevant Medications   amLODipine (NORVASC) 10 MG tablet, refilled   Other Relevant Orders   AMB referral to CHF clinic   Lipid panel (Completed) Encouraged continued diet and exercise efforts  Encouraged continued compliance with medication     Chronic diastolic CHF (congestive heart failure) (HCC)   Relevant Medications   amLODipine (NORVASC) 10 MG tablet  Other Relevant Orders   AMB referral to CHF clinic   Lipid panel (Completed) Encouraged continued diet and exercise efforts  Encouraged continued compliance with medication     Other Visit Diagnoses     Type 2 diabetes mellitus with hyperglycemia, with long-term current use of insulin ([place])    -  Primary   Relevant Orders   HgB A1c (Completed): 7.3   Lipid panel (Completed) Encouraged continued diet and exercise efforts  Encouraged continued compliance with medication   Informed that symptoms could be related to new prescription for Tramadol. Encouraged to discuss symptoms with pain management, may require change in dosage.   Discussed non pharmacological methods for management of fatigue and weakness   Follow up in 3 mths for reevaluation of HTN, sooner as needed     I am having [name] maintain her glucose monitoring kit, ipratropium-albuterol, methocarbamol, montelukast, Stiolto Respimat, albuterol, carvedilol, Dexcom G6 Transmitter, furosemide, metFORMIN, atorvastatin, cetirizine, docusate sodium, gabapentin, fluticasone, ondansetron, acetaminophen, insulin glargine, insulin aspart, ergocalciferol, losartan, insulin aspart protamine- aspart, FreeStyle Libre 2 Sensor, [employer] 2 Reader, spironolactone, pantoprazole, glipiZIDE, Trulicity, Entresto, predniSONE, DULoxetine, traMADol, albuterol, and amLODipine.  Meds ordered this encounter  Medications   DISCONTD: amLODipine (NORVASC) 10 MG tablet    Sig: Take 1 tablet (10 mg total) by mouth daily.     Dispense:  90 tablet    Refill:  0   amLODipine (NORVASC) 10 MG tablet    Sig: Take 1 tablet (10 mg total) by mouth daily.    Dispense:  90 tablet    Refill:  0     Teena Dunk, NP

## 2022-02-16 LAB — LIPID PANEL
Chol/HDL Ratio: 3.5 ratio (ref 0.0–4.4)
Cholesterol, Total: 278 mg/dL — ABNORMAL HIGH (ref 100–199)
HDL: 80 mg/dL (ref >39)
LDL Chol Calc (NIH): 170 mg/dL — ABNORMAL HIGH (ref 0–99)
Triglycerides: 155 mg/dL — ABNORMAL HIGH (ref 0–149)
VLDL Cholesterol Cal: 28 mg/dL (ref 5–40)

## 2022-02-21 ENCOUNTER — Other Ambulatory Visit: Payer: Self-pay | Admitting: Nurse Practitioner

## 2022-02-21 ENCOUNTER — Other Ambulatory Visit: Payer: Self-pay

## 2022-02-21 DIAGNOSIS — E785 Hyperlipidemia, unspecified: Secondary | ICD-10-CM

## 2022-02-21 MED ORDER — ATORVASTATIN CALCIUM 20 MG PO TABS
40.0000 mg | ORAL_TABLET | Freq: Every day | ORAL | 3 refills | Status: DC
  Filled 2022-02-21: qty 90, 45d supply, fill #0

## 2022-02-21 MED ORDER — ATORVASTATIN CALCIUM 40 MG PO TABS
40.0000 mg | ORAL_TABLET | Freq: Every day | ORAL | 3 refills | Status: DC
  Filled 2022-02-21 – 2022-05-09 (×2): qty 90, 90d supply, fill #0

## 2022-02-28 ENCOUNTER — Other Ambulatory Visit: Payer: Self-pay

## 2022-03-01 DIAGNOSIS — J449 Chronic obstructive pulmonary disease, unspecified: Secondary | ICD-10-CM | POA: Diagnosis not present

## 2022-03-06 ENCOUNTER — Telehealth: Payer: Self-pay

## 2022-03-06 DIAGNOSIS — Z794 Long term (current) use of insulin: Secondary | ICD-10-CM | POA: Diagnosis not present

## 2022-03-06 DIAGNOSIS — E119 Type 2 diabetes mellitus without complications: Secondary | ICD-10-CM | POA: Diagnosis not present

## 2022-03-06 MED ORDER — TRAMADOL HCL 50 MG PO TABS: 50.0000 mg | ORAL_TABLET | Freq: Four times a day (QID) | ORAL | 5 refills | Status: DC | PRN

## 2022-03-06 MED ORDER — ACETAMINOPHEN 325 MG PO TABS: 650.0000 mg | ORAL_TABLET | Freq: Four times a day (QID) | ORAL | Status: AC | PRN

## 2022-03-06 NOTE — Addendum Note (Signed)
Addended by: Courtney Heys on: 03/06/2022 11:33 AM   Modules accepted: Orders

## 2022-03-06 NOTE — Telephone Encounter (Signed)
FYI:  Patient has had to cancel her back injections because of continued elevated blood pressure.   Filled  Written  ID  Drug  QTY  Days  Prescriber  RX #  Dispenser  Refill  Daily Dose*  Pymt Type  PMP  12/07/2021 12/07/2021 1  Tramadol Hcl 50 Mg Tablet 120.00 30 Me Lov [id number] [facility] ([id number]) 0/0 20.00 MME Medicare [place]

## 2022-03-07 ENCOUNTER — Encounter: Payer: Medicare Other | Admitting: Physical Medicine & Rehabilitation

## 2022-03-21 ENCOUNTER — Telehealth: Payer: Self-pay | Admitting: Nurse Practitioner

## 2022-03-24 ENCOUNTER — Telehealth: Payer: Self-pay | Admitting: Nurse Practitioner

## 2022-03-24 ENCOUNTER — Encounter: Payer: Medicare Other | Admitting: Physical Medicine and Rehabilitation

## 2022-03-24 NOTE — Telephone Encounter (Signed)
Patient left second message regarding constipation and would like a call back.  Last msg was on 6/27

## 2022-03-29 ENCOUNTER — Encounter: Payer: Self-pay | Admitting: Nurse Practitioner

## 2022-03-29 ENCOUNTER — Ambulatory Visit (INDEPENDENT_AMBULATORY_CARE_PROVIDER_SITE_OTHER): Payer: Medicare Other | Admitting: Nurse Practitioner

## 2022-03-29 VITALS — BP 155/93 | HR 88 | Temp 97.5°F | Ht 66.0 in | Wt 285.0 lb

## 2022-03-29 DIAGNOSIS — I10 Essential (primary) hypertension: Secondary | ICD-10-CM | POA: Diagnosis not present

## 2022-03-29 DIAGNOSIS — K5909 Other constipation: Secondary | ICD-10-CM

## 2022-03-29 MED ORDER — LINACLOTIDE 72 MCG PO CAPS: 72.0000 ug | ORAL_CAPSULE | Freq: Every day | ORAL | 0 refills | Status: DC

## 2022-03-29 MED ORDER — CLONIDINE HCL 0.1 MG PO TABS: 0.1000 mg | ORAL_TABLET | Freq: Two times a day (BID) | ORAL | 0 refills | Status: DC

## 2022-03-29 NOTE — Patient Instructions (Signed)
You were seen today in the [facility] for constipation and HTN.  You were prescribed medications, please take as directed. Please follow up in 1 mth for reevaluation of B/P and constipation

## 2022-03-29 NOTE — Progress Notes (Signed)
Sharon Swanson, [place]  [id number] Phone:  [phone]   Fax:  [phone] Subjective:   Patient ID: Sharon Swanson, female    DOB: 1961/06/11, 61 y.o.   MRN: [id number]  Chief Complaint  Patient presents with   Constipation    Pt stated she has been constipated for a week, pt has abdominal pain    Sharon Swanson 61 y.o. female  has a past medical history of Abnormal Pap smear of cervix (10/2020), Acid reflux, Asthma, Atypical squamous cells of undetermined significance (ASC-US) on cervical Pap smear (10/2020), Bilateral lower extremity edema (01/2020), COPD (chronic obstructive pulmonary disease) ([place]), CPAP (continuous positive airway pressure) dependence, Diabetes ([place]), Diabetes mellitus without complication ([place]), Fatty liver (11/08/2011), Fibroids (11/08/2011), Hyperglycemia, Hyperlipidemia (05/2020), Hypertension, Microalbuminuria (01/2020), Obesity (BMI 30-39.9) (11/08/2011), Osteoarthritis of right acromioclavicular joint, Pancreatitis (11/08/2011), Rotator cuff tear, right, Shortness of breath on exertion (01/2020), Sleep apnea, Ventral hernia, and Vitamin D deficiency (05/2020). To the [facility] for constipation.  Patient states that she has had constipation x 1 wk. Endorses feeling bloated. Has been taking laxatives with no improvement in symptoms. Was given Linzess in the past with complete resolution of symptoms, requesting refill of medication. Denies any abdominal pain, but states she feels "stuffed and sore." Has the urge to go without any bowel movement. Flatulence intact. Able to eat without difficulty. Denies any other concerns. Currently compliant with all medications.  Concerned about B/P today, states that since B/P remains elevated she has been unable to get back surgery.   Denies any fatigue, chest pain, shortness of breath, HA or dizziness. Denies any blurred vision, numbness or tingling.   Past Medical History:  Diagnosis Date    Abnormal Pap smear of cervix 10/2020   Acid reflux    Asthma    Atypical squamous cells of undetermined significance (ASC-US) on cervical Pap smear 10/2020   Bilateral lower extremity edema 01/2020   COPD (chronic obstructive pulmonary disease) ([facility])    CPAP (continuous positive airway pressure) dependence    Diabetes ([place])    Diabetes mellitus without complication ([place])    Type II   Fatty liver 11/08/2011   Fibroids 11/08/2011   Hyperglycemia    Hyperlipidemia 05/2020   Hypertension    Microalbuminuria 01/2020   Obesity (BMI 30-39.9) 11/08/2011   Osteoarthritis of right acromioclavicular joint    Pancreatitis 11/08/2011   Rotator cuff tear, right    Shortness of breath on exertion 01/2020   Sleep apnea    Ventral hernia    Vitamin D deficiency 05/2020    Past Surgical History:  Procedure Laterality Date   ORTHOPEDIC SURGERY     R ankle, tendonitis   UMBILICAL HERNIA REPAIR N/A 09/30/2021   Procedure: INCARCERATED HERNIA REPAIR UMBILICAL ADULT;  Surgeon: Dwan Bolt, MD;  Location: [facility];  Service: General;  Laterality: N/A;    Family History  Problem Relation Age of Onset   Hypertension Mother    Diabetes Other    Diabetes Maternal Grandmother    Hypertension Maternal Grandmother     Social History   Socioeconomic History   Marital status: Married    Spouse name: Not on file   Number of children: Not on file   Years of education: Not on file   Highest education level: Not on file  Occupational History   Not on file  Tobacco Use   Smoking status: Every Day    Packs/day: 0.10    Years:  39.00    Total pack years: 3.90    Types: Cigarettes    Start date: 09/25/1980   Smokeless tobacco: Never   Tobacco comments:    pt states she smoke 2 cigerettes a day. States she uses nicotine patches.  Vaping Use   Vaping Use: Never used  Substance and Sexual Activity   Alcohol use: No   Drug use: No   Sexual activity: Not Currently    Partners: Male  Other Topics  Concern   Not on file  Social History Narrative   Not on file   Social Determinants of Health   Financial Resource Strain: Medium Risk (06/08/2021)   Overall Financial Resource Strain (CARDIA)    Difficulty of Paying Living Expenses: Somewhat hard  Food Insecurity: No Food Insecurity (06/08/2021)   Hunger Vital Sign    Worried About Running Out of Food in the Last Year: Never true    Ran Out of Food in the Last Year: Never true  Transportation Needs: No Transportation Needs (06/08/2021)   PRAPARE - Hydrologist (Medical): No    Lack of Transportation (Non-Medical): No  Physical Activity: Inactive (06/08/2021)   Exercise Vital Sign    Days of Exercise per Week: 0 days    Minutes of Exercise per Session: 0 min  Stress: Stress Concern Present (06/08/2021)   [place]    Feeling of Stress : To some extent  Social Connections: Socially Integrated (06/08/2021)   Social Connection and Isolation Panel [NHANES]    Frequency of Communication with Friends and Family: Three times a week    Frequency of Social Gatherings with Friends and Family: Three times a week    Attends Religious Services: More than 4 times per year    Active Member of Clubs or Organizations: Yes    Attends Archivist Meetings: 1 to 4 times per year    Marital Status: Married  Human resources officer Violence: Not At Risk (06/08/2021)   Humiliation, Afraid, Rape, and Kick questionnaire    Fear of Current or Ex-Partner: No    Emotionally Abused: No    Physically Abused: No    Sexually Abused: No    Outpatient Medications Prior to Visit  Medication Sig Dispense Refill   acetaminophen (TYLENOL) 325 MG tablet Take 2 tablets (650 mg total) by mouth every 6 (six) hours as needed.     albuterol (VENTOLIN HFA) 108 (90 Base) MCG/ACT inhaler USE 2 INHALATIONS BY MOUTH  INTO THE LUNGS EVERY 4  HOURS AS NEEDED FOR  WHEEZING OR SHORTNESS  OF  BREATH (Patient taking differently: 2 puffs every 4 (four) hours as needed for wheezing or shortness of breath.) 51 g 3   albuterol (VENTOLIN HFA) 108 (90 Base) MCG/ACT inhaler Inhale 2 puffs into the lungs every 4 (four) hours as needed.     amLODipine (NORVASC) 10 MG tablet Take 1 tablet (10 mg total) by mouth daily. 90 tablet 0   atorvastatin (LIPITOR) 40 MG tablet Take 1 tablet (40 mg total) by mouth daily. 90 tablet 3   carvedilol (COREG) 12.5 MG tablet Take 1 tablet (12.5 mg total) by mouth 2 (two) times daily. 180 tablet 3   cetirizine (ZYRTEC) 10 MG tablet Take 1 tablet (10 mg total) by mouth in the morning. (Patient taking differently: Take 10 mg by mouth daily as needed for allergies.) 90 tablet 3   Continuous Blood Gluc Receiver (FREESTYLE LIBRE 2 READER)  DEVI Use as directed daily. 1 each 1   Continuous Blood Gluc Sensor (FREESTYLE LIBRE 2 SENSOR) MISC 1 application by Does not apply route every 14 (fourteen) days. 2 each 11   Continuous Blood Gluc Transmit (DEXCOM G6 TRANSMITTER) MISC Use as directed 6 each 3   docusate sodium (COLACE) 100 MG capsule Take 1 capsule (100 mg total) by mouth 3 (three) times daily as needed for mild constipation. 270 capsule 3   DULoxetine (CYMBALTA) 60 MG capsule Take 1 capsule (60 mg total) by mouth daily. 30 capsule 5   ergocalciferol (VITAMIN D2) 1.25 MG (50000 UT) capsule Take by mouth.     fluticasone (FLONASE) 50 MCG/ACT nasal spray Place 1 spray into both nostrils daily.     furosemide (LASIX) 40 MG tablet Take 1 tablet (40 mg total) by mouth 2 (two) times daily. 180 tablet 3   gabapentin (NEURONTIN) 300 MG capsule Take 1 capsule (300 mg total) by mouth 3 (three) times daily. 270 capsule 3   glipiZIDE (GLUCOTROL) 10 MG tablet Take 1 tablet (10 mg total) by mouth 2 (two) times daily. 60 tablet 0   glucose monitoring kit (FREESTYLE) monitoring kit 1 each by Does not apply route as needed for other. 1 each 0   insulin aspart (NOVOLOG) 100 UNIT/ML  injection Inject 0-15 Units into the skin 3 (three) times daily with meals. Sliding Scale as noted below CBG <70: treat low blood sugar CBG 70-120: 0 units CBG 121-150: 2 units novolog CBG 151-200: 3 units novolog CBG 201-250: 5 units novolog CBG 251-300: 8 units novolog CBG 301-350: 11 units novolog CBG 351-400: 15 units novology CBG >400: call MD 10 mL 11   insulin glargine (LANTUS) 100 UNIT/ML injection Inject 0.15 mLs (15 Units total) into the skin at bedtime.     losartan (COZAAR) 100 MG tablet Take by mouth.     metFORMIN (GLUCOPHAGE) 1000 MG tablet Take 1 tablet (1,000 mg total) by mouth 2 (two) times daily with a meal. 180 tablet 3   methocarbamol (ROBAXIN) 750 MG tablet Take 1 tablet (750 mg total) by mouth 3 (three) times daily as needed for muscle spasms. 270 tablet 3   montelukast (SINGULAIR) 10 MG tablet Take 1 tablet (10 mg total) by mouth at bedtime. 90 tablet 3   ondansetron (ZOFRAN) 4 MG tablet Take 1 tablet (4 mg total) by mouth every 8 (eight) hours as needed for nausea or vomiting. 60 tablet 0   pantoprazole (PROTONIX) 40 MG tablet Take 1 tablet (40 mg total) by mouth daily. 30 tablet 0   spironolactone (ALDACTONE) 25 MG tablet TAKE 1 TABLET (25 MG TOTAL) BY MOUTH DAILY. 30 tablet 0   Tiotropium Bromide-Olodaterol (STIOLTO RESPIMAT) 2.5-2.5 MCG/ACT AERS Inhale 2 puffs into the lungs daily. 12 g 3   traMADol (ULTRAM) 50 MG tablet Take 1 tablet (50 mg total) by mouth every 6 (six) hours as needed for moderate pain or severe pain. 120 tablet 5   insulin aspart protamine- aspart (NOVOLOG MIX 70/30) (70-30) 100 UNIT/ML injection INJECT 0.2 MLS (20 UNITS TOTAL) INTO THE SKIN 2 (TWO) TIMES DAILY WITH A MEAL.     ipratropium-albuterol (DUONEB) 0.5-2.5 (3) MG/3ML SOLN Take 3 mLs by nebulization every 6 (six) hours as needed (shortness of breath, wheeze). 360 mL 12   predniSONE (DELTASONE) 10 MG tablet Take 4 tablets (40 mg total) by mouth daily with breakfast. 40 mg daily x 3 days,  then 20 mg x 3 days then 10 mg  x 3 days, then 5 mg x 3 days (Patient not taking: Reported on 02/15/2022) 24 tablet 0   No facility-administered medications prior to visit.    Allergies  Allergen Reactions   Aspirin Other (See Comments)    _0  Abdominal pain Abdominal pain    Review of Systems  Constitutional:  Negative for chills, fever and malaise/fatigue.  Respiratory:  Negative for cough and shortness of breath.   Cardiovascular:  Negative for chest pain, palpitations and leg swelling.  Gastrointestinal:  Positive for constipation. Negative for abdominal pain, blood in stool, diarrhea, nausea and vomiting.  Skin: Negative.   Neurological: Negative.   Psychiatric/Behavioral:  Negative for depression. The patient is not nervous/anxious.   All other systems reviewed and are negative.      Objective:    Physical Exam Constitutional:      General: She is not in acute distress.    Appearance: Normal appearance. She is obese.  HENT:     Head: Normocephalic.  Cardiovascular:     Rate and Rhythm: Normal rate and regular rhythm.     Pulses: Normal pulses.     Heart sounds: Normal heart sounds.     Comments: No obvious peripheral edema Pulmonary:     Effort: Pulmonary effort is normal.     Breath sounds: Normal breath sounds.  Abdominal:     General: There is distension.     Palpations: Abdomen is soft. There is no mass.     Tenderness: There is no abdominal tenderness. There is no right CVA tenderness, left CVA tenderness, guarding or rebound.     Hernia: No hernia is present.     Comments: Hypoactive bowel sounds throughout   Skin:    General: Skin is warm and dry.     Capillary Refill: Capillary refill takes less than 2 seconds.  Neurological:     General: No focal deficit present.     Mental Status: She is alert and oriented to person, place, and time.  Psychiatric:        Mood and Affect: Mood  normal.        Behavior: Behavior normal.        Thought Content: Thought content normal.        Judgment: Judgment normal.     BP (!) 155/93 (BP Location: Left Arm, Patient Position: Sitting, Cuff Size: Large)   Pulse 88   Temp (!) 97.5 F (36.4 C)   Ht _1  (1.676 m)   Wt 285 lb (129.3 kg)   SpO2 100%   BMI 46.00 kg/m  Wt Readings from Last 3 Encounters:  03/29/22 285 lb (129.3 kg)  02/15/22 288 lb 9.6 oz (130.9 kg)  01/19/22 284 lb (128.8 kg)    Immunization History  Administered Date(s) Administered   DTaP 05/28/2020   Influenza,inj,Quad PF,6+ Mos 08/18/2014, 06/20/2016, 05/28/2020   Influenza-Unspecified 08/18/2014, 06/19/2016   Moderna Sars-Covid-2 Vaccination 12/12/2019, 01/13/2020   Pneumococcal Polysaccharide-23 08/18/2014, 04/25/2016, 06/28/2020   Pneumococcal-Unspecified 05/29/2016    Diabetic Foot Exam - Simple   No data filed     Lab Results  Component Value Date   TSH 1.060 10/04/2020   Lab Results  Component Value Date   WBC 3.5 (L) 10/06/2021   HGB 11.6 (L) 10/06/2021   HCT 37.5 10/06/2021   MCV 93.5 10/06/2021   PLT 232 10/06/2021   Lab Results  Component Value Date   NA 139 10/06/2021   K  3.6 10/06/2021   CO2 33 (H) 10/06/2021   GLUCOSE 129 (H) 10/06/2021   BUN 10 10/06/2021   CREATININE 0.66 10/06/2021   BILITOT 0.5 10/06/2021   ALKPHOS 73 10/06/2021   AST 51 (H) 10/06/2021   ALT 59 (H) 10/06/2021   PROT 7.0 10/06/2021   ALBUMIN 3.4 (L) 10/06/2021   CALCIUM 8.8 (L) 10/06/2021   ANIONGAP 9 10/06/2021   EGFR 87 02/02/2021   GFR 128.72 03/01/2020   Lab Results  Component Value Date   CHOL 278 (H) 02/15/2022   CHOL 171 10/04/2020   CHOL 250 (H) 05/28/2020   Lab Results  Component Value Date   HDL 80 02/15/2022   HDL 60 10/04/2020   HDL 60 05/28/2020   Lab Results  Component Value Date   LDLCALC 170 (H) 02/15/2022   LDLCALC 90 10/04/2020   LDLCALC 162 (H) 05/28/2020   Lab Results  Component Value Date   TRIG 155  (H) 02/15/2022   TRIG 121 10/04/2020   TRIG 157 (H) 05/28/2020   Lab Results  Component Value Date   CHOLHDL 3.5 02/15/2022   CHOLHDL 2.9 10/04/2020   CHOLHDL 4.2 05/28/2020   Lab Results  Component Value Date   HGBA1C 7.3 (A) 02/15/2022   HGBA1C 7.3 02/15/2022   HGBA1C 7.3 (A) 02/15/2022   HGBA1C 7.3 (A) 02/15/2022       Assessment & Plan:   Problem List Items Addressed This Visit       Cardiovascular and Mediastinum   Essential hypertension   Relevant Medications   cloNIDine (CATAPRES) 0.1 MG tablet, initiated during visit  Encouraged continued diet and exercise efforts  Encouraged continued compliance with medication  Encouraged to maintain a B/P diary at home     Digestive   Constipation, chronic - Primary   Relevant Medications   linaclotide (LINZESS) 72 MCG capsule, initiated during visit  Discussed non pharmacological methods for management of symptoms Informed to take OTC medications as needed Discussed diet at length   Follow up in 1 mth for reevaluation of HTN and constipation, sooner as needed     I am having [name] start on cloNIDine and linaclotide. I am also having her maintain her glucose monitoring kit, ipratropium-albuterol, methocarbamol, montelukast, Stiolto Respimat, albuterol, carvedilol, Dexcom G6 Transmitter, furosemide, metFORMIN, cetirizine, docusate sodium, gabapentin, fluticasone, ondansetron, insulin glargine, insulin aspart, ergocalciferol, losartan, insulin aspart protamine- aspart, FreeStyle Libre 2 Sensor, [employer] 2 Reader, spironolactone, pantoprazole, glipiZIDE, predniSONE, DULoxetine, albuterol, amLODipine, atorvastatin, acetaminophen, and traMADol.  Meds ordered this encounter  Medications   cloNIDine (CATAPRES) 0.1 MG tablet    Sig: Take 1 tablet (0.1 mg total) by mouth 2 (two) times daily.    Dispense:  180 tablet    Refill:  0   linaclotide (LINZESS) 72 MCG capsule    Sig: Take 1 capsule (72 mcg total) by mouth  daily before breakfast.    Dispense:  30 capsule    Refill:  0     Teena Dunk, NP

## 2022-03-30 DIAGNOSIS — E119 Type 2 diabetes mellitus without complications: Secondary | ICD-10-CM | POA: Diagnosis not present

## 2022-03-30 DIAGNOSIS — Z794 Long term (current) use of insulin: Secondary | ICD-10-CM | POA: Diagnosis not present

## 2022-04-24 ENCOUNTER — Other Ambulatory Visit (HOSPITAL_COMMUNITY): Payer: Self-pay

## 2022-04-24 ENCOUNTER — Other Ambulatory Visit: Payer: Self-pay | Admitting: Nurse Practitioner

## 2022-04-24 DIAGNOSIS — E1165 Type 2 diabetes mellitus with hyperglycemia: Secondary | ICD-10-CM

## 2022-04-24 MED ORDER — GLIPIZIDE 10 MG PO TABS
10.0000 mg | ORAL_TABLET | Freq: Two times a day (BID) | ORAL | 0 refills | Status: DC
  Filled 2022-04-24 – 2022-05-09 (×3): qty 60, 30d supply, fill #0

## 2022-04-28 ENCOUNTER — Encounter: Payer: Medicare Other | Admitting: Physical Medicine & Rehabilitation

## 2022-04-29 DIAGNOSIS — Z794 Long term (current) use of insulin: Secondary | ICD-10-CM | POA: Diagnosis not present

## 2022-04-29 DIAGNOSIS — E119 Type 2 diabetes mellitus without complications: Secondary | ICD-10-CM | POA: Diagnosis not present

## 2022-05-01 ENCOUNTER — Ambulatory Visit: Payer: Self-pay | Admitting: Nurse Practitioner

## 2022-05-09 ENCOUNTER — Other Ambulatory Visit: Payer: Self-pay

## 2022-05-09 ENCOUNTER — Other Ambulatory Visit: Payer: Self-pay | Admitting: Physician Assistant

## 2022-05-09 ENCOUNTER — Other Ambulatory Visit: Payer: Self-pay | Admitting: Nurse Practitioner

## 2022-05-09 DIAGNOSIS — R600 Localized edema: Secondary | ICD-10-CM

## 2022-05-09 DIAGNOSIS — E1165 Type 2 diabetes mellitus with hyperglycemia: Secondary | ICD-10-CM

## 2022-05-09 MED ORDER — CARVEDILOL 12.5 MG PO TABS
12.5000 mg | ORAL_TABLET | Freq: Two times a day (BID) | ORAL | 0 refills | Status: DC
  Filled 2022-05-09: qty 60, 30d supply, fill #0

## 2022-05-10 ENCOUNTER — Other Ambulatory Visit: Payer: Self-pay

## 2022-05-10 MED ORDER — FUROSEMIDE 40 MG PO TABS
40.0000 mg | ORAL_TABLET | Freq: Two times a day (BID) | ORAL | 3 refills | Status: DC
  Filled 2022-05-10: qty 180, 90d supply, fill #0

## 2022-05-10 MED ORDER — PANTOPRAZOLE SODIUM 40 MG PO TBEC
40.0000 mg | DELAYED_RELEASE_TABLET | Freq: Every day | ORAL | 0 refills | Status: DC
  Filled 2022-05-10: qty 30, 30d supply, fill #0

## 2022-05-10 MED ORDER — SPIRONOLACTONE 25 MG PO TABS
ORAL_TABLET | Freq: Every day | ORAL | 0 refills | Status: DC
  Filled 2022-05-10: qty 30, 30d supply, fill #0

## 2022-05-10 MED ORDER — METFORMIN HCL 1000 MG PO TABS
1000.0000 mg | ORAL_TABLET | Freq: Two times a day (BID) | ORAL | 3 refills | Status: DC
  Filled 2022-05-10: qty 180, 90d supply, fill #0

## 2022-05-10 MED ORDER — GABAPENTIN 300 MG PO CAPS
300.0000 mg | ORAL_CAPSULE | Freq: Three times a day (TID) | ORAL | 3 refills | Status: DC
  Filled 2022-05-10: qty 270, 90d supply, fill #0

## 2022-05-13 ENCOUNTER — Other Ambulatory Visit (HOSPITAL_COMMUNITY): Payer: Self-pay

## 2022-05-15 ENCOUNTER — Other Ambulatory Visit: Payer: Self-pay

## 2022-05-18 ENCOUNTER — Other Ambulatory Visit: Payer: Self-pay

## 2022-05-18 ENCOUNTER — Ambulatory Visit: Payer: Self-pay | Admitting: Nurse Practitioner

## 2022-05-19 ENCOUNTER — Telehealth: Payer: Self-pay

## 2022-05-19 ENCOUNTER — Other Ambulatory Visit: Payer: Self-pay

## 2022-05-19 MED ORDER — TRAMADOL HCL 50 MG PO TABS: 50.0000 mg | ORAL_TABLET | Freq: Four times a day (QID) | ORAL | 2 refills | Status: DC | PRN

## 2022-05-19 NOTE — Telephone Encounter (Signed)
Patient calling in requesting refill for tramadol

## 2022-05-19 NOTE — Telephone Encounter (Signed)
Spoke w. Ms. Zigmund Daniel and let her know she will get next refill at her next appointment, she said she will make sure she schedules an appointment.

## 2022-05-22 ENCOUNTER — Other Ambulatory Visit: Payer: Self-pay | Admitting: Nurse Practitioner

## 2022-05-22 DIAGNOSIS — I10 Essential (primary) hypertension: Secondary | ICD-10-CM

## 2022-05-29 DIAGNOSIS — E119 Type 2 diabetes mellitus without complications: Secondary | ICD-10-CM | POA: Diagnosis not present

## 2022-05-29 DIAGNOSIS — Z794 Long term (current) use of insulin: Secondary | ICD-10-CM | POA: Diagnosis not present

## 2022-06-07 ENCOUNTER — Other Ambulatory Visit: Payer: Self-pay

## 2022-06-08 ENCOUNTER — Other Ambulatory Visit: Payer: Self-pay

## 2022-06-08 ENCOUNTER — Other Ambulatory Visit: Payer: Self-pay | Admitting: Interventional Cardiology

## 2022-06-08 MED ORDER — CARVEDILOL 12.5 MG PO TABS
12.5000 mg | ORAL_TABLET | Freq: Two times a day (BID) | ORAL | 0 refills | Status: DC
  Filled 2022-06-08: qty 60, 30d supply, fill #0

## 2022-06-10 NOTE — Progress Notes (Unsigned)
Office Visit    Patient Name: Sharon Swanson Date of Encounter: 06/12/2022  Primary Care Provider:  Teena Dunk, NP Primary Cardiologist:  Larae Grooms, MD Primary Electrophysiologist: None  Chief Complaint    Sharon Swanson is a 61 y.o. female with PMH of HTN, COPD, IDDM II, HFpEF, OSA, tobacco abuse who presents today for follow-up of congestive heart failure.  Past Medical History    Past Medical History:  Diagnosis Date   Abnormal Pap smear of cervix 10/2020   Acid reflux    Asthma    Atypical squamous cells of undetermined significance (ASC-US) on cervical Pap smear 10/2020   Bilateral lower extremity edema 01/2020   COPD (chronic obstructive pulmonary disease) (HCC)    CPAP (continuous positive airway pressure) dependence    Diabetes ([place])    Diabetes mellitus without complication ([place])    Type II   Fatty liver 11/08/2011   Fibroids 11/08/2011   Hyperglycemia    Hyperlipidemia 05/2020   Hypertension    Microalbuminuria 01/2020   Obesity (BMI 30-39.9) 11/08/2011   Osteoarthritis of right acromioclavicular joint    Pancreatitis 11/08/2011   Rotator cuff tear, right    Shortness of breath on exertion 01/2020   Sleep apnea    Ventral hernia    Vitamin D deficiency 05/2020   Past Surgical History:  Procedure Laterality Date   ORTHOPEDIC SURGERY     R ankle, tendonitis   UMBILICAL HERNIA REPAIR N/A 09/30/2021   Procedure: INCARCERATED HERNIA REPAIR UMBILICAL ADULT;  Surgeon: Dwan Bolt, MD;  Location: [facility];  Service: General;  Laterality: N/A;    Allergies  Allergies  Allergen Reactions   Aspirin Other (See Comments)    Abdominal pain Abdominal pain Abdominal pain Abdominal pain Abdominal pain Abdominal pain Abdominal pain    History of Present Illness    Sharon Swanson has a PMH of is a 61 year old female with the above mention past medical history who presents today for follow-up of congestive heart failure.  Sharon Swanson was  recently seen by Dr.Varanasi on 02/2020 by referral from PCP for lower extremity edema.  She was previously admitted to the ED with worsening shortness of breath due to medication noncompliance after losing health insurance.  She was admitted and given IV Lasix drip and 5 days with antibiotics for COVID-19 treatment.  She was discharged in stable condition with plan to obtain CPAP at home.  2D echo was completed 07/2019 with EF of 60-65%, no RWMA with LVH and mildly dilated LA/RA.  She was seen by Estella Husk, PA on 01/2021 patient.  She was started on carvedilol due to elevated blood pressure and followed up in the HTN clinic and was transitioned to [facility] and spironolactone.  During visit patient was needing surgical clearance for ventral hernia repair.  2D echo was repeated and showed EF of 60-65%, mild concentric LVH, mild MV regurgitation.  Sharon Swanson presents today alone for follow-up appointment.  Since last being seen in the office patient reports that she has noticed elevated blood pressures at home with increased palpitations at rest. today in office blood pressure readings are elevated at 164/102.  She is compliant with current medication regimen and and denies any adverse reactions.  She reports that she has noticed palpitations while at rest and also a twinge of pain under her left breast.  She also reports nausea and dizziness with this discomfort.  She states this occurs at rest and with activity and resolves  spontaneously.  We discussed her medications and reviewed the importance of compliance and knowing which medications she is prescribed.  She is euvolemic on examination today.  Patient denies chest pain, palpitations, dyspnea, PND, orthopnea, nausea, vomiting, dizziness, syncope, edema, weight gain, or early satiety.  Home Medications    Current Outpatient Medications  Medication Sig Dispense Refill   acetaminophen (TYLENOL) 325 MG tablet Take 2 tablets (650 mg total) by mouth every  6 (six) hours as needed.     albuterol (VENTOLIN HFA) 108 (90 Base) MCG/ACT inhaler USE 2 INHALATIONS BY MOUTH  INTO THE LUNGS EVERY 4  HOURS AS NEEDED FOR  WHEEZING OR SHORTNESS OF  BREATH (Patient taking differently: 2 puffs every 4 (four) hours as needed for wheezing or shortness of breath.) 51 g 3   albuterol (VENTOLIN HFA) 108 (90 Base) MCG/ACT inhaler Inhale 2 puffs into the lungs every 4 (four) hours as needed.     amLODipine (NORVASC) 10 MG tablet TAKE 1 TABLET(10 MG) BY MOUTH DAILY 90 tablet 0   aspirin EC 81 MG tablet Take 1 tablet (81 mg total) by mouth daily. Swallow whole. 90 tablet 3   atorvastatin (LIPITOR) 40 MG tablet Take 1 tablet (40 mg total) by mouth daily. 90 tablet 3   cetirizine (ZYRTEC) 10 MG tablet Take 1 tablet (10 mg total) by mouth in the morning. (Patient taking differently: Take 10 mg by mouth daily as needed for allergies.) 90 tablet 3   cloNIDine (CATAPRES) 0.1 MG tablet Take 1 tablet (0.1 mg total) by mouth 2 (two) times daily. 180 tablet 0   Continuous Blood Gluc Receiver (FREESTYLE LIBRE 2 READER) DEVI Use as directed daily. 1 each 1   Continuous Blood Gluc Sensor (FREESTYLE LIBRE 2 SENSOR) MISC 1 application by Does not apply route every 14 (fourteen) days. 2 each 11   Continuous Blood Gluc Transmit (DEXCOM G6 TRANSMITTER) MISC Use as directed 6 each 3   docusate sodium (COLACE) 100 MG capsule Take 1 capsule (100 mg total) by mouth 3 (three) times daily as needed for mild constipation. 270 capsule 3   DULoxetine (CYMBALTA) 60 MG capsule Take 1 capsule (60 mg total) by mouth daily. 30 capsule 5   ENTRESTO 97-103 MG Take 1 tablet by mouth 2 (two) times daily. 60 tablet 2   ergocalciferol (VITAMIN D2) 1.25 MG (50000 UT) capsule Take by mouth.     fluticasone (FLONASE) 50 MCG/ACT nasal spray Place 1 spray into both nostrils daily.     furosemide (LASIX) 40 MG tablet Take 1 tablet (40 mg total) by mouth 2 (two) times daily. 180 tablet 3   gabapentin (NEURONTIN) 300 MG  capsule Take 1 capsule (300 mg total) by mouth 3 (three) times daily. 270 capsule 3   glipiZIDE (GLUCOTROL) 10 MG tablet Take 1 tablet (10 mg total) by mouth 2 (two) times daily. 60 tablet 0   glucose monitoring kit (FREESTYLE) monitoring kit 1 each by Does not apply route as needed for other. 1 each 0   insulin aspart (NOVOLOG) 100 UNIT/ML injection Inject 0-15 Units into the skin 3 (three) times daily with meals. Sliding Scale as noted below CBG <70: treat low blood sugar CBG 70-120: 0 units CBG 121-150: 2 units novolog CBG 151-200: 3 units novolog CBG 201-250: 5 units novolog CBG 251-300: 8 units novolog CBG 301-350: 11 units novolog CBG 351-400: 15 units novology CBG >400: call MD 10 mL 11   insulin aspart protamine- aspart (NOVOLOG MIX 70/30) (70-30) 100  UNIT/ML injection INJECT 0.2 MLS (20 UNITS TOTAL) INTO THE SKIN 2 (TWO) TIMES DAILY WITH A MEAL.     insulin glargine (LANTUS) 100 UNIT/ML injection Inject 0.15 mLs (15 Units total) into the skin at bedtime.     linaclotide (LINZESS) 72 MCG capsule Take 1 capsule (72 mcg total) by mouth daily before breakfast. 30 capsule 0   losartan (COZAAR) 100 MG tablet Take by mouth.     metFORMIN (GLUCOPHAGE) 1000 MG tablet Take 1 tablet (1,000 mg total) by mouth 2 (two) times daily with a meal. 180 tablet 3   methocarbamol (ROBAXIN) 750 MG tablet Take 1 tablet (750 mg total) by mouth 3 (three) times daily as needed for muscle spasms. 270 tablet 3   montelukast (SINGULAIR) 10 MG tablet Take 1 tablet (10 mg total) by mouth at bedtime. 90 tablet 3   ondansetron (ZOFRAN) 4 MG tablet Take 1 tablet (4 mg total) by mouth every 8 (eight) hours as needed for nausea or vomiting. 60 tablet 0   pantoprazole (PROTONIX) 40 MG tablet Take 1 tablet (40 mg total) by mouth daily. 30 tablet 0   predniSONE (DELTASONE) 10 MG tablet Take 4 tablets (40 mg total) by mouth daily with breakfast. 40 mg daily x 3 days, then 20 mg x 3 days then 10 mg x 3 days, then 5 mg x 3 days  24 tablet 0   spironolactone (ALDACTONE) 25 MG tablet TAKE 1 TABLET (25 MG TOTAL) BY MOUTH DAILY. 30 tablet 0   Tiotropium Bromide-Olodaterol (STIOLTO RESPIMAT) 2.5-2.5 MCG/ACT AERS Inhale 2 puffs into the lungs daily. 12 g 3   traMADol (ULTRAM) 50 MG tablet Take 1 tablet (50 mg total) by mouth every 6 (six) hours as needed for moderate pain or severe pain. 120 tablet 2   carvedilol (COREG) 25 MG tablet Take 1 tablet (25 mg total) by mouth 2 (two) times daily. 180 tablet 3   ipratropium-albuterol (DUONEB) 0.5-2.5 (3) MG/3ML SOLN Take 3 mLs by nebulization every 6 (six) hours as needed (shortness of breath, wheeze). 360 mL 12   losartan-hydrochlorothiazide (HYZAAR) 100-25 MG tablet Take 1 tablet by mouth daily.     No current facility-administered medications for this visit.     Review of Systems  Please see the history of present illness.    (+) Chest discomfort under left breast (+) Palpitations  All other systems reviewed and are otherwise negative except as noted above.  Physical Exam    Wt Readings from Last 3 Encounters:  06/12/22 292 lb 6.4 oz (132.6 kg)  03/29/22 285 lb (129.3 kg)  02/15/22 288 lb 9.6 oz (130.9 kg)   VS: Vitals:   06/12/22 0800 06/12/22 0836  BP: (!) 164/102 (!) 170/108  Pulse: (!) 101   SpO2: 94%   ,Body mass index is 47.19 kg/m.  Constitutional:      Appearance: Obese. Not in distress.  Neck:     Vascular: JVD normal.  Pulmonary:     Effort: Pulmonary effort is normal.     Breath sounds: No wheezing. No rales. Diminished in the bases Cardiovascular:     Normal rate. Regular rhythm. Normal S1. Normal S2.      Murmurs: There is no murmur.  Edema:    Peripheral edema trace in lower extremities Abdominal:     Palpations: Abdomen is soft non tender. There is no hepatomegaly.  Skin:    General: Skin is warm and dry.  Neurological:     General: No focal deficit  present.     Mental Status: Alert and oriented to person, place and time.     Cranial  Nerves: Cranial nerves are intact.  EKG/LABS/Other Studies Reviewed    ECG personally reviewed by me today -sinus tachycardia with rate of 101 bpm left atrial enlargement and left axis deviation and TWI in leads V5 similar to previous EKGs.     Lab Results  Component Value Date   WBC 3.5 (L) 10/06/2021   HGB 11.6 (L) 10/06/2021   HCT 37.5 10/06/2021   MCV 93.5 10/06/2021   PLT 232 10/06/2021   Lab Results  Component Value Date   CREATININE 0.66 10/06/2021   BUN 10 10/06/2021   NA 139 10/06/2021   K 3.6 10/06/2021   CL 97 (L) 10/06/2021   CO2 33 (H) 10/06/2021   Lab Results  Component Value Date   ALT 59 (H) 10/06/2021   AST 51 (H) 10/06/2021   ALKPHOS 73 10/06/2021   BILITOT 0.5 10/06/2021   Lab Results  Component Value Date   CHOL 278 (H) 02/15/2022   HDL 80 02/15/2022   LDLCALC 170 (H) 02/15/2022   TRIG 155 (H) 02/15/2022   CHOLHDL 3.5 02/15/2022    Lab Results  Component Value Date   HGBA1C 7.3 (A) 02/15/2022   HGBA1C 7.3 02/15/2022   HGBA1C 7.3 (A) 02/15/2022   HGBA1C 7.3 (A) 02/15/2022    Assessment & Plan    1.  HFpEF: -Last 2D echo was completed 02/2021 with EF of 60-65%, mild MV regurgitation, concentric LVH and mildly dilated LA -Patient presents today with lower extremity edema and is euvolemic on examination -Continue carvedilol 12.5 mg daily, Entresto 97/103, spironolactone 25 mg daily -BMET today  2.  Hypertensive heart disease:  HYPERTENSION CONTROL Vitals:   06/12/22 0800 06/12/22 0836  BP: (!) 164/102 (!) 170/108    The patient's blood pressure is elevated above target today.  In order to address the patient's elevated BP: A current anti-hypertensive medication was adjusted today.; Follow up with general cardiology has been recommended.; A referral to the [facility] will be placed.      -Patient's blood pressure today was elevated at 164/102 and 170/108 on recheck -Continue current antihypertensive regimen with  clonidine 0.1 mg daily, carvedilol as noted above, spironolactone, Entresto and amlodipine 10 mg -TSH and CBC today to rule out possible anemia and thyroid abnormalities for elevated heart rate and hypertension -Patient will have blood pressure check in 1 week with nurse and instructed to check BPs at home bring to checkup. -Patient encouraged to follow DASH diet and reduce sodium intake -May complete renal ultrasounds to evaluate possible secondary causes at next visit  3.  COPD: -Patient currently followed by pulmonology Dr. Kimber Relic -Continue current treatment plan per pulmonology  4.  Morbid obesity: -Patient's BMI is 47.74  5.  Tobacco abuse: -Smoking cessation discussed -We will discuss further at upcoming follow-up   6.  Atypical chest pain: -Patient reports twinge of pain that is associated with nausea that occurs with palpitations. -We will have patient wear a 14-day ZIO monitor to evaluate palpitations -Lexiscan Myoview to evaluate chest discomfort and possible ischemia. -Patient encouraged to try ASA 81 mg daily and instructed to discontinue if abdominal pain persist   Disposition: Follow-up with Larae Grooms, MD or APP in 4 months Shared Decision Making/Informed Consent{ All outpatient stress tests require an informed consent ([id number]) ATTESTATION ORDER  The risks [chest pain, shortness of breath, cardiac arrhythmias, dizziness, blood pressure fluctuations,  myocardial infarction, stroke/transient ischemic attack, nausea, vomiting, allergic reaction, radiation exposure, metallic taste sensation and life-threatening complications (estimated to be 1 in 10,000)], benefits (risk stratification, diagnosing coronary artery disease, treatment guidance) and alternatives of a nuclear stress test were discussed in detail with Sharon Swanson and she agrees to proceed.   Medication Adjustments/Labs and Tests Ordered: Current medicines are reviewed at length with the patient today.   Concerns regarding medicines are outlined above.   Signed, Mable Fill, Marissa Nestle, NP 06/12/2022, 8:56 AM [place]

## 2022-06-12 ENCOUNTER — Ambulatory Visit: Payer: Medicare Other | Attending: Nurse Practitioner

## 2022-06-12 ENCOUNTER — Ambulatory Visit: Payer: Medicare Other | Attending: Nurse Practitioner | Admitting: Nurse Practitioner

## 2022-06-12 ENCOUNTER — Encounter: Payer: Self-pay | Admitting: *Deleted

## 2022-06-12 ENCOUNTER — Encounter: Payer: Self-pay | Admitting: Nurse Practitioner

## 2022-06-12 ENCOUNTER — Other Ambulatory Visit: Payer: Self-pay

## 2022-06-12 VITALS — BP 170/108 | HR 101 | Ht 66.0 in | Wt 292.4 lb

## 2022-06-12 DIAGNOSIS — R0602 Shortness of breath: Secondary | ICD-10-CM

## 2022-06-12 DIAGNOSIS — R002 Palpitations: Secondary | ICD-10-CM

## 2022-06-12 DIAGNOSIS — I5032 Chronic diastolic (congestive) heart failure: Secondary | ICD-10-CM | POA: Diagnosis not present

## 2022-06-12 DIAGNOSIS — I110 Hypertensive heart disease with heart failure: Secondary | ICD-10-CM

## 2022-06-12 DIAGNOSIS — R0789 Other chest pain: Secondary | ICD-10-CM

## 2022-06-12 DIAGNOSIS — J449 Chronic obstructive pulmonary disease, unspecified: Secondary | ICD-10-CM | POA: Diagnosis not present

## 2022-06-12 DIAGNOSIS — I10 Essential (primary) hypertension: Secondary | ICD-10-CM

## 2022-06-12 MED ORDER — ASPIRIN 81 MG PO TBEC: 81.0000 mg | DELAYED_RELEASE_TABLET | Freq: Every day | ORAL | 3 refills | Status: AC

## 2022-06-12 MED ORDER — CARVEDILOL 25 MG PO TABS: 25.0000 mg | ORAL_TABLET | Freq: Two times a day (BID) | ORAL | 3 refills | Status: DC

## 2022-06-12 MED ORDER — CARVEDILOL 25 MG PO TABS
25.0000 mg | ORAL_TABLET | Freq: Two times a day (BID) | ORAL | 3 refills | Status: DC
  Filled 2022-06-12: qty 180, 90d supply, fill #0

## 2022-06-12 NOTE — Patient Instructions (Addendum)
Medication Instructions:  Your physician has recommended you make the following change in your medication:   INCREASE the Carvedilol(Coreg) to 25 mg taking 1 twice a day  START Aspirin 81 mg taking 1 daily    *If you need a refill on your cardiac medications before your next appointment, please call your pharmacy*   Lab Work: TODAY:  BMET, CBC, MAG, TSH, & T3  If you have labs (blood work) drawn today and your tests are completely normal, you will receive your results only by: [place] (if you have MyChart) OR A paper copy in the mail If you have any lab test that is abnormal or we need to change your treatment, we will call you to review the results.   Testing/Procedures: Your physician has requested that you have a lexiscan myoview. For further information please visit [email or web address]. Please follow instruction sheet, as given.   ZIO XT- Long Term Monitor Instructions  Your physician has requested you wear a ZIO patch monitor for 14 days.  This is a single patch monitor. [employer] supplies one patch monitor per enrollment. Additional stickers are not available. Please do not apply patch if you will be having a Nuclear Stress Test,  Echocardiogram, Cardiac CT, MRI, or Chest Xray during the period you would be wearing the  monitor. The patch cannot be worn during these tests. You cannot remove and re-apply the  ZIO XT patch monitor.  Your ZIO patch monitor will be mailed 3 day USPS to your address on file. It may take 3-5 days  to receive your monitor after you have been enrolled.  Once you have received your monitor, please review the enclosed instructions. Your monitor  has already been registered assigning a specific monitor serial # to you.  Billing and Patient Assistance Program Information  We have supplied [employer] with any of your insurance information on file for billing purposes. [employer] offers a sliding scale Patient Assistance Program for patients that do not  have  insurance, or whose insurance does not completely cover the cost of the ZIO monitor.  You must apply for the Patient Assistance Program to qualify for this discounted rate.  To apply, please call [employer] at [phone], select option 4, select option 2, ask to apply for  Patient Assistance Program. Theodore Demark will ask your household income, and how many people  are in your household. They will quote your out-of-pocket cost based on that information.  [employer] will also be able to set up a 78-month interest-free payment plan if needed.  Applying the monitor   Shave hair from upper left chest.  Hold abrader disc by orange tab. Rub abrader in 40 strokes over the upper left chest as  indicated in your monitor instructions.  Clean area with 4 enclosed alcohol pads. Let dry.  Apply patch as indicated in monitor instructions. Patch will be placed under collarbone on left  side of chest with arrow pointing upward.  Rub patch adhesive wings for 2 minutes. Remove white label marked "1". Remove the white  label marked "2". Rub patch adhesive wings for 2 additional minutes.  While looking in a mirror, press and release button in center of patch. A small green light will  flash 3-4 times. This will be your only indicator that the monitor has been turned on.  Do not shower for the first 24 hours. You may shower after the first 24 hours.  Press the button if you feel a symptom. You will hear a small click. Record  Date, Time and  Symptom in the Patient Logbook.  When you are ready to remove the patch, follow instructions on the last 2 pages of Patient  Logbook. Stick patch monitor onto the last page of Patient Logbook.  Place Patient Logbook in the blue and white box. Use locking tab on box and tape box closed  securely. The blue and white box has prepaid postage on it. Please place it in the mailbox as  soon as possible. Your physician should have your test results approximately 7 days after the   monitor has been mailed back to [facility].  Call [place] at [phone] if you have questions regarding  your ZIO XT patch monitor. Call them immediately if you see an orange light blinking on your  monitor.  If your monitor falls off in less than 4 days, contact our Monitor department at [phone].  If your monitor becomes loose or falls off after 4 days call [employer] at [phone] for  suggestions on securing your monitor     Your physician recommends that you schedule a follow-up appointment in: 1 WEEK FOR BP CHECK  Follow-Up: At [facility], you and your health needs are our priority.  As part of our continuing mission to provide you with exceptional heart care, we have created designated Provider Care Teams.  These Care Teams include your primary Cardiologist (physician) and Advanced Practice Providers (APPs -  Physician Assistants and Nurse Practitioners) who all work together to provide you with the care you need, when you need it.  We recommend signing up for the patient portal called "MyChart".  Sign up information is provided on this After Visit Summary.  MyChart is used to connect with patients for Virtual Visits (Telemedicine).  Patients are able to view lab/test results, encounter notes, upcoming appointments, etc.  Non-urgent messages can be sent to your provider as well.   To learn more about what you can do with MyChart, go to [email or web address].    Your next appointment:   4 week(s)  The format for your next appointment:   In Person  Provider:   Ambrose Pancoast, NP         Other Instructions   Important Information About Sugar

## 2022-06-12 NOTE — Progress Notes (Unsigned)
Enrolled for [employer] to mail a ZIO XT long term holter monitor to the patients address on file.   Dr. Varanasi to read. 

## 2022-06-13 LAB — TSH: TSH: 1.14 u[IU]/mL (ref 0.450–4.500)

## 2022-06-13 LAB — BASIC METABOLIC PANEL
BUN/Creatinine Ratio: 15 (ref 12–28)
BUN: 11 mg/dL (ref 8–27)
CO2: 28 mmol/L (ref 20–29)
Calcium: 9.6 mg/dL (ref 8.7–10.3)
Chloride: 97 mmol/L (ref 96–106)
Creatinine, Ser: 0.72 mg/dL (ref 0.57–1.00)
Glucose: 200 mg/dL — ABNORMAL HIGH (ref 70–99)
Potassium: 4.7 mmol/L (ref 3.5–5.2)
Sodium: 140 mmol/L (ref 134–144)
eGFR: 95 mL/min/{1.73_m2} (ref >59)

## 2022-06-13 LAB — CBC
Hematocrit: 47.6 % — ABNORMAL HIGH (ref 34.0–46.6)
Hemoglobin: 15.9 g/dL (ref 11.1–15.9)
MCH: 28.6 pg (ref 26.6–33.0)
MCHC: 33.4 g/dL (ref 31.5–35.7)
MCV: 86 fL (ref 79–97)
Platelets: 213 10*3/uL (ref 150–450)
RBC: 5.55 x10E6/uL — ABNORMAL HIGH (ref 3.77–5.28)
RDW: 12.9 % (ref 11.7–15.4)
WBC: 4.3 10*3/uL (ref 3.4–10.8)

## 2022-06-13 LAB — MAGNESIUM: Magnesium: 1.8 mg/dL (ref 1.6–2.3)

## 2022-06-13 LAB — T3: T3, Total: 121 ng/dL (ref 71–180)

## 2022-06-20 ENCOUNTER — Telehealth (HOSPITAL_COMMUNITY): Payer: Self-pay

## 2022-06-20 NOTE — Telephone Encounter (Signed)
Attempted to contact the patient. There was no answer. Will try her again later. S.Iyanna Drummer EMTP

## 2022-06-22 ENCOUNTER — Ambulatory Visit (HOSPITAL_COMMUNITY): Payer: Medicare Other | Attending: Nurse Practitioner

## 2022-06-22 DIAGNOSIS — R0789 Other chest pain: Secondary | ICD-10-CM | POA: Insufficient documentation

## 2022-06-22 DIAGNOSIS — I5032 Chronic diastolic (congestive) heart failure: Secondary | ICD-10-CM | POA: Diagnosis not present

## 2022-06-22 DIAGNOSIS — J449 Chronic obstructive pulmonary disease, unspecified: Secondary | ICD-10-CM | POA: Insufficient documentation

## 2022-06-22 DIAGNOSIS — R0602 Shortness of breath: Secondary | ICD-10-CM | POA: Diagnosis not present

## 2022-06-22 DIAGNOSIS — I110 Hypertensive heart disease with heart failure: Secondary | ICD-10-CM | POA: Diagnosis not present

## 2022-06-22 DIAGNOSIS — R002 Palpitations: Secondary | ICD-10-CM | POA: Insufficient documentation

## 2022-06-22 MED ORDER — TECHNETIUM TC 99M TETROFOSMIN IV KIT
32.6000 | PACK | Freq: Once | INTRAVENOUS | Status: AC | PRN
  Administered 2022-06-22: 32.6 via INTRAVENOUS

## 2022-06-22 MED ORDER — REGADENOSON 0.4 MG/5ML IV SOLN
0.4000 mg | Freq: Once | INTRAVENOUS | Status: AC
  Administered 2022-06-22: 0.4 mg via INTRAVENOUS

## 2022-06-23 ENCOUNTER — Inpatient Hospital Stay (HOSPITAL_COMMUNITY): Admission: RE | Admit: 2022-06-23 | Payer: Medicare Other | Source: Ambulatory Visit

## 2022-06-23 ENCOUNTER — Ambulatory Visit (HOSPITAL_COMMUNITY): Payer: Medicare Other | Attending: Nurse Practitioner

## 2022-06-23 DIAGNOSIS — R0789 Other chest pain: Secondary | ICD-10-CM

## 2022-06-23 DIAGNOSIS — J449 Chronic obstructive pulmonary disease, unspecified: Secondary | ICD-10-CM

## 2022-06-23 DIAGNOSIS — I110 Hypertensive heart disease with heart failure: Secondary | ICD-10-CM | POA: Diagnosis not present

## 2022-06-23 DIAGNOSIS — R002 Palpitations: Secondary | ICD-10-CM

## 2022-06-23 DIAGNOSIS — R0602 Shortness of breath: Secondary | ICD-10-CM | POA: Diagnosis not present

## 2022-06-23 DIAGNOSIS — I5032 Chronic diastolic (congestive) heart failure: Secondary | ICD-10-CM | POA: Diagnosis not present

## 2022-06-23 LAB — MYOCARDIAL PERFUSION IMAGING
LV dias vol: 119 mL (ref 46–106)
LV sys vol: 57 mL
Nuc Stress EF: 52 %
Peak HR: 111 {beats}/min
Rest HR: 90 {beats}/min
Rest Nuclear Isotope Dose: 31.4 mCi
SDS: 1
SRS: 1
SSS: 2
ST Depression (mm): 0 mm
Stress Nuclear Isotope Dose: 32.6 mCi
TID: 0.82

## 2022-06-23 MED ORDER — TECHNETIUM TC 99M TETROFOSMIN IV KIT
31.4000 | PACK | Freq: Once | INTRAVENOUS | Status: AC | PRN
  Administered 2022-06-23: 31.4 via INTRAVENOUS

## 2022-07-05 DIAGNOSIS — Z794 Long term (current) use of insulin: Secondary | ICD-10-CM | POA: Diagnosis not present

## 2022-07-05 DIAGNOSIS — E119 Type 2 diabetes mellitus without complications: Secondary | ICD-10-CM | POA: Diagnosis not present

## 2022-07-11 NOTE — Progress Notes (Deleted)
Office Visit    Patient Name: Sharon Swanson Date of Encounter: 07/11/2022  Primary Care Provider:  Teena Dunk, NP Primary Cardiologist:  Larae Grooms, MD Primary Electrophysiologist: None  Chief Complaint    Sharon Swanson is a 61 y.o. female with PMH of HTN, COPD, IDDM II, HFpEF, OSA, tobacco abuse who presents today for follow-up of congestive heart failure.  Past Medical History    Past Medical History:  Diagnosis Date   Abnormal Pap smear of cervix 10/2020   Acid reflux    Asthma    Atypical squamous cells of undetermined significance (ASC-US) on cervical Pap smear 10/2020   Bilateral lower extremity edema 01/2020   COPD (chronic obstructive pulmonary disease) (HCC)    CPAP (continuous positive airway pressure) dependence    Diabetes ([place])    Diabetes mellitus without complication ([place])    Type II   Fatty liver 11/08/2011   Fibroids 11/08/2011   Hyperglycemia    Hyperlipidemia 05/2020   Hypertension    Microalbuminuria 01/2020   Obesity (BMI 30-39.9) 11/08/2011   Osteoarthritis of right acromioclavicular joint    Pancreatitis 11/08/2011   Rotator cuff tear, right    Shortness of breath on exertion 01/2020   Sleep apnea    Ventral hernia    Vitamin D deficiency 05/2020   Past Surgical History:  Procedure Laterality Date   ORTHOPEDIC SURGERY     R ankle, tendonitis   UMBILICAL HERNIA REPAIR N/A 09/30/2021   Procedure: INCARCERATED HERNIA REPAIR UMBILICAL ADULT;  Surgeon: Dwan Bolt, MD;  Location: [facility];  Service: General;  Laterality: N/A;    Allergies  Allergies  Allergen Reactions   Aspirin Other (See Comments)    Abdominal pain Abdominal pain Abdominal pain Abdominal pain Abdominal pain Abdominal pain Abdominal pain    History of Present Illness    Sharon Swanson has a PMH of is a 60 year old female with the above mention past medical history who presents today for follow-up of congestive heart failure.  Sharon Swanson was  recently seen by Dr.Varanasi on 02/2020 by referral from PCP for lower extremity edema.  She was previously admitted to the ED with worsening shortness of breath due to medication noncompliance after losing health insurance.  She was admitted and given IV Lasix drip and 5 days with antibiotics for COVID-19 treatment.  She was discharged in stable condition with plan to obtain CPAP at home.  2D echo was completed 07/2019 with EF of 60-65%, no RWMA with LVH and mildly dilated LA/RA.  She was seen by Estella Husk, PA on 01/2021 patient.  She was started on carvedilol due to elevated blood pressure and followed up in the HTN clinic and was transitioned to [facility] and spironolactone.  During visit patient was needing surgical clearance for ventral hernia repair.  2D echo was repeated and showed EF of 60-65%, mild concentric LVH, mild MV regurgitation.  She was seen for follow-up of congestive heart failure on 9/18 and patient was hypertensive with blood pressures of 164/102 and remained elevated at 170/108 on recheck.  She was referred to hypertension clinic for blood pressure medication titration.  Patient was also sent for [facility] due to chest discomfort that revealed no ischemia with normal heart pumping function.  She also wore ZIO monitor for 14 days   Since last being seen in the office patient reports***.  Patient denies chest pain, palpitations, dyspnea, PND, orthopnea, nausea, vomiting, dizziness, syncope, edema, weight gain, or early satiety.     ***  Notes: -Renal ultrasound - review ZIO monitor and Lexiscan results Home Medications    Current Outpatient Medications  Medication Sig Dispense Refill   acetaminophen (TYLENOL) 325 MG tablet Take 2 tablets (650 mg total) by mouth every 6 (six) hours as needed.     albuterol (VENTOLIN HFA) 108 (90 Base) MCG/ACT inhaler USE 2 INHALATIONS BY MOUTH  INTO THE LUNGS EVERY 4  HOURS AS NEEDED FOR  WHEEZING OR SHORTNESS OF  BREATH (Patient taking  differently: 2 puffs every 4 (four) hours as needed for wheezing or shortness of breath.) 51 g 3   albuterol (VENTOLIN HFA) 108 (90 Base) MCG/ACT inhaler Inhale 2 puffs into the lungs every 4 (four) hours as needed.     amLODipine (NORVASC) 10 MG tablet TAKE 1 TABLET(10 MG) BY MOUTH DAILY 90 tablet 0   aspirin EC 81 MG tablet Take 1 tablet (81 mg total) by mouth daily. Swallow whole. 90 tablet 3   atorvastatin (LIPITOR) 40 MG tablet Take 1 tablet (40 mg total) by mouth daily. 90 tablet 3   carvedilol (COREG) 25 MG tablet Take 1 tablet (25 mg total) by mouth 2 (two) times daily. 180 tablet 3   cetirizine (ZYRTEC) 10 MG tablet Take 1 tablet (10 mg total) by mouth in the morning. (Patient taking differently: Take 10 mg by mouth daily as needed for allergies.) 90 tablet 3   cloNIDine (CATAPRES) 0.1 MG tablet Take 1 tablet (0.1 mg total) by mouth 2 (two) times daily. 180 tablet 0   Continuous Blood Gluc Receiver (FREESTYLE LIBRE 2 READER) DEVI Use as directed daily. 1 each 1   Continuous Blood Gluc Sensor (FREESTYLE LIBRE 2 SENSOR) MISC 1 application by Does not apply route every 14 (fourteen) days. 2 each 11   Continuous Blood Gluc Transmit (DEXCOM G6 TRANSMITTER) MISC Use as directed 6 each 3   docusate sodium (COLACE) 100 MG capsule Take 1 capsule (100 mg total) by mouth 3 (three) times daily as needed for mild constipation. 270 capsule 3   DULoxetine (CYMBALTA) 60 MG capsule Take 1 capsule (60 mg total) by mouth daily. 30 capsule 5   ergocalciferol (VITAMIN D2) 1.25 MG (50000 UT) capsule Take by mouth.     fluticasone (FLONASE) 50 MCG/ACT nasal spray Place 1 spray into both nostrils daily.     furosemide (LASIX) 40 MG tablet Take 1 tablet (40 mg total) by mouth 2 (two) times daily. 180 tablet 3   gabapentin (NEURONTIN) 300 MG capsule Take 1 capsule (300 mg total) by mouth 3 (three) times daily. 270 capsule 3   glipiZIDE (GLUCOTROL) 10 MG tablet Take 1 tablet (10 mg total) by mouth 2 (two) times  daily. 60 tablet 0   glucose monitoring kit (FREESTYLE) monitoring kit 1 each by Does not apply route as needed for other. 1 each 0   insulin aspart (NOVOLOG) 100 UNIT/ML injection Inject 0-15 Units into the skin 3 (three) times daily with meals. Sliding Scale as noted below CBG <70: treat low blood sugar CBG 70-120: 0 units CBG 121-150: 2 units novolog CBG 151-200: 3 units novolog CBG 201-250: 5 units novolog CBG 251-300: 8 units novolog CBG 301-350: 11 units novolog CBG 351-400: 15 units novology CBG >400: call MD 10 mL 11   insulin aspart protamine- aspart (NOVOLOG MIX 70/30) (70-30) 100 UNIT/ML injection INJECT 0.2 MLS (20 UNITS TOTAL) INTO THE SKIN 2 (TWO) TIMES DAILY WITH A MEAL.     insulin glargine (LANTUS) 100 UNIT/ML injection Inject 0.15 mLs (  15 Units total) into the skin at bedtime.     ipratropium-albuterol (DUONEB) 0.5-2.5 (3) MG/3ML SOLN Take 3 mLs by nebulization every 6 (six) hours as needed (shortness of breath, wheeze). 360 mL 12   linaclotide (LINZESS) 72 MCG capsule Take 1 capsule (72 mcg total) by mouth daily before breakfast. 30 capsule 0   losartan (COZAAR) 100 MG tablet Take by mouth.     losartan-hydrochlorothiazide (HYZAAR) 100-25 MG tablet Take 1 tablet by mouth daily.     metFORMIN (GLUCOPHAGE) 1000 MG tablet Take 1 tablet (1,000 mg total) by mouth 2 (two) times daily with a meal. 180 tablet 3   methocarbamol (ROBAXIN) 750 MG tablet Take 1 tablet (750 mg total) by mouth 3 (three) times daily as needed for muscle spasms. 270 tablet 3   montelukast (SINGULAIR) 10 MG tablet Take 1 tablet (10 mg total) by mouth at bedtime. 90 tablet 3   ondansetron (ZOFRAN) 4 MG tablet Take 1 tablet (4 mg total) by mouth every 8 (eight) hours as needed for nausea or vomiting. 60 tablet 0   pantoprazole (PROTONIX) 40 MG tablet Take 1 tablet (40 mg total) by mouth daily. 30 tablet 0   predniSONE (DELTASONE) 10 MG tablet Take 4 tablets (40 mg total) by mouth daily with breakfast. 40 mg  daily x 3 days, then 20 mg x 3 days then 10 mg x 3 days, then 5 mg x 3 days 24 tablet 0   spironolactone (ALDACTONE) 25 MG tablet TAKE 1 TABLET (25 MG TOTAL) BY MOUTH DAILY. 30 tablet 0   Tiotropium Bromide-Olodaterol (STIOLTO RESPIMAT) 2.5-2.5 MCG/ACT AERS Inhale 2 puffs into the lungs daily. 12 g 3   traMADol (ULTRAM) 50 MG tablet Take 1 tablet (50 mg total) by mouth every 6 (six) hours as needed for moderate pain or severe pain. 120 tablet 2   No current facility-administered medications for this visit.     Review of Systems  Please see the history of present illness.    (+)*** (+)***  All other systems reviewed and are otherwise negative except as noted above.  Physical Exam    Wt Readings from Last 3 Encounters:  06/22/22 292 lb (132.5 kg)  06/12/22 292 lb 6.4 oz (132.6 kg)  03/29/22 285 lb (129.3 kg)   VO:ZDGUY were no vitals filed for this visit.,There is no height or weight on file to calculate BMI.  Constitutional:      Appearance: Healthy appearance. Not in distress.  Neck:     Vascular: JVD normal.  Pulmonary:     Effort: Pulmonary effort is normal.     Breath sounds: No wheezing. No rales. Diminished in the bases Cardiovascular:     Normal rate. Regular rhythm. Normal S1. Normal S2.      Murmurs: There is no murmur.  Edema:    Peripheral edema absent.  Abdominal:     Palpations: Abdomen is soft non tender. There is no hepatomegaly.  Skin:    General: Skin is warm and dry.  Neurological:     General: No focal deficit present.     Mental Status: Alert and oriented to person, place and time.     Cranial Nerves: Cranial nerves are intact.  EKG/LABS/Other Studies Reviewed    ECG personally reviewed by me today - ***  Risk Assessment/Calculations:   {Does this patient have ATRIAL FIBRILLATION?:[phone]}        Lab Results  Component Value Date   WBC 4.3 06/12/2022   HGB 15.9 06/12/2022  HCT 47.6 (H) 06/12/2022   MCV 86 06/12/2022   PLT 213  06/12/2022   Lab Results  Component Value Date   CREATININE 0.72 06/12/2022   BUN 11 06/12/2022   NA 140 06/12/2022   K 4.7 06/12/2022   CL 97 06/12/2022   CO2 28 06/12/2022   Lab Results  Component Value Date   ALT 59 (H) 10/06/2021   AST 51 (H) 10/06/2021   ALKPHOS 73 10/06/2021   BILITOT 0.5 10/06/2021   Lab Results  Component Value Date   CHOL 278 (H) 02/15/2022   HDL 80 02/15/2022   LDLCALC 170 (H) 02/15/2022   TRIG 155 (H) 02/15/2022   CHOLHDL 3.5 02/15/2022    Lab Results  Component Value Date   HGBA1C 7.3 (A) 02/15/2022   HGBA1C 7.3 02/15/2022   HGBA1C 7.3 (A) 02/15/2022   HGBA1C 7.3 (A) 02/15/2022    Assessment & Plan    Essential hypertension: -Patient's previous blood pressure was 164/102 and 170/108 on recheck -Today her blood pressure was***  2.   3.  COPD: -Patient currently followed by pulmonology Dr. Kimber Relic -Continue current treatment plan per pulmonology  4.  Morbid obesity: -Patient's BMI is 47.74   5.  Tobacco abuse: -Smoking cessation discussed -We will discuss further at upcoming follow-up     Disposition: Follow-up with Larae Grooms, MD or APP in *** months {Are you ordering a CV Procedure (e.g. stress test, cath, DCCV, TEE, etc)?   Press F2        :[id number]}   Medication Adjustments/Labs and Tests Ordered: Current medicines are reviewed at length with the patient today.  Concerns regarding medicines are outlined above.   Signed, Mable Fill, Marissa Nestle, NP 07/11/2022, 11:43 AM [facility]  Note:  This document was prepared using Dragon voice recognition software and may include unintentional dictation errors.

## 2022-07-13 ENCOUNTER — Ambulatory Visit: Payer: Medicare Other | Admitting: Nurse Practitioner

## 2022-07-13 DIAGNOSIS — I10 Essential (primary) hypertension: Secondary | ICD-10-CM

## 2022-07-19 ENCOUNTER — Encounter: Payer: Self-pay | Admitting: Internal Medicine

## 2022-07-19 DIAGNOSIS — R0789 Other chest pain: Secondary | ICD-10-CM | POA: Diagnosis not present

## 2022-07-19 DIAGNOSIS — I110 Hypertensive heart disease with heart failure: Secondary | ICD-10-CM | POA: Diagnosis not present

## 2022-07-26 ENCOUNTER — Telehealth: Payer: Self-pay

## 2022-07-26 NOTE — Telephone Encounter (Signed)
Pt called and shared the Zio Monitor results as stated, below with patient.     The plan portion of Mr. Jaquelyn Bitter NP's orders were shared with Pt, and she knows to:  1. Take her morning BP / HR meds, 2. Go to local pharmacy and have BP / HR checked,  3. Write down BP / HR, and share with Mr Jaquelyn Bitter, NP by calling DATA into [facility] on 08/02/22, next Wednesday.    Pt stated she till feels short episodes of palpations that occur intermittently.  Not, continuously, and does not occur all day long.  She is not symptomatic when these occur.   Pt knows to call us or go to ER if she does become symptomatic, or has new symptoms, or her HR does NOT slow down.

## 2022-07-26 NOTE — Telephone Encounter (Signed)
-----   Message from Marylu Lund., NP sent at 07/26/2022  7:16 AM EDT ----- ZIO monitor results show predominant rhythm of sinus with no evidence of atrial fibrillation.  There were no pathologic arrhythmias noted and brief runs of extra beats in the top and bottom portion of the heart occurred lasting 8 beats with a maximal rate of 207 bpm.  Plan: -Please assess if Sharon Swanson is still having elevated blood pressures similar to her office visit. -If blood pressures remain elevated we will increase carvedilol to 37.5 mg twice daily.  This will also help suppress any breakthrough palpitations. -Please advise patient to check blood pressures for 1 week and report findings back to office.  If blood pressures remain elevated we will refer to hypertension clinic for assistance with titration.  Ambrose Pancoast, NP

## 2022-08-04 DIAGNOSIS — Z794 Long term (current) use of insulin: Secondary | ICD-10-CM | POA: Diagnosis not present

## 2022-08-04 DIAGNOSIS — E119 Type 2 diabetes mellitus without complications: Secondary | ICD-10-CM | POA: Diagnosis not present

## 2022-08-07 ENCOUNTER — Other Ambulatory Visit: Payer: Medicare Other | Admitting: Pharmacist

## 2022-08-07 ENCOUNTER — Telehealth: Payer: Self-pay | Admitting: Pharmacist

## 2022-08-07 ENCOUNTER — Encounter: Payer: Self-pay | Admitting: Internal Medicine

## 2022-08-07 DIAGNOSIS — R7309 Other abnormal glucose: Secondary | ICD-10-CM

## 2022-08-07 DIAGNOSIS — I10 Essential (primary) hypertension: Secondary | ICD-10-CM

## 2022-08-07 DIAGNOSIS — R600 Localized edema: Secondary | ICD-10-CM

## 2022-08-07 DIAGNOSIS — R739 Hyperglycemia, unspecified: Secondary | ICD-10-CM

## 2022-08-07 DIAGNOSIS — E1165 Type 2 diabetes mellitus with hyperglycemia: Secondary | ICD-10-CM

## 2022-08-07 DIAGNOSIS — E785 Hyperlipidemia, unspecified: Secondary | ICD-10-CM

## 2022-08-07 MED ORDER — METFORMIN HCL 1000 MG PO TABS: 1000.0000 mg | ORAL_TABLET | Freq: Two times a day (BID) | ORAL | 1 refills | Status: DC

## 2022-08-07 MED ORDER — INSULIN ASPART 100 UNIT/ML IJ SOLN: 0.0000 [IU] | Freq: Three times a day (TID) | INTRAMUSCULAR | 1 refills | Status: DC

## 2022-08-07 MED ORDER — INSULIN GLARGINE 100 UNIT/ML ~~LOC~~ SOLN: 20.0000 [IU] | Freq: Every day | SUBCUTANEOUS | 1 refills | Status: DC

## 2022-08-07 NOTE — Progress Notes (Cosign Needed Addendum)
08/07/2022 Name: Felix Pratt MRN: [id number] DOB: [birth year]  Chief Complaint  Patient presents with   Medication Management   Diabetes   Hypertension    Gabi Mcfate is a 61 y.o. year old female who presented for a telephone visit.   They were referred to the pharmacist by a quality report for assistance in managing diabetes and hypertension.   Subjective:  Care Team: Primary Care Provider: Bo Merino I, NP ; Next Scheduled Visit: none scheduled with [facility]  Needs to reschedule missed follow up with Cardiology, needs to schedule with GI  Medication Access/Adherence  Current Pharmacy:  [facility] [place], [place] - [address] [place] [address] Phone: [phone] Fax: [phone]   Patient reports affordability concerns with their medications: No  Patient reports access/transportation concerns to their pharmacy: No  Patient reports adherence concerns with their medications:  Yes  reports she needs refills on several medications. Through review today, there are several she has not filled lately.    Diabetes:  Current medications: metformin 1000 mg twice daily, Lantus 15 units - though reports she has been taking 20 units "as needed" if sugars have been high; Novolog per sliding scale, though reports she takes 20 units "if elevated"  Using Libre CGM. Reports her sensor goes off for both lows and highs periodically.   Heart Failure, preserved EF:  Current medications:  ACEi/ARB/ARNI: Entresto 97/103 twice daily SGLT2i: none, consider moving forward Beta blocker: carvedilol 25 mg twice daily Mineralocorticoid Receptor Antagonist: spironolactone 25 mg daily - reports she has run out  Diuretic regimen: furosemide 40 mg twice daily, though notes she is only needing PRN Additional antihypertensives: amlodipine 10 mg daily, clonidine 0.1 mg twice daily is prescribed but per fill history, patient has not taken  in a while  Current home blood pressure readings: reports home SBP ~160-170s  Hyperlipidemia/ASCVD Risk Reduction  Current lipid lowering medications: atorvastatin 40 mg daily - though per fill history, has not filled recently.   Antiplatelet regimen: aspirin 81 mg daily  Health Maintenance  Health Maintenance Due  Topic Date Due   FOOT EXAM  Never done   OPHTHALMOLOGY EXAM  Never done   Hepatitis C Screening  Never done   TETANUS/TDAP  Never done   MAMMOGRAM  Never done   Zoster Vaccines- Shingrix (1 of 2) Never done   COVID-19 Vaccine (3 - Moderna series) 03/09/2020   Diabetic kidney evaluation - Urine ACR  01/18/2021   INFLUENZA VACCINE  04/25/2022   Medicare Annual Wellness (AWV)  06/08/2022   COLONOSCOPY (Pts 45-42yr Insurance coverage will need to be confirmed)  07/18/2022     Objective: Lab Results  Component Value Date   HGBA1C 7.3 (A) 02/15/2022   HGBA1C 7.3 02/15/2022   HGBA1C 7.3 (A) 02/15/2022   HGBA1C 7.3 (A) 02/15/2022    Lab Results  Component Value Date   CREATININE 0.72 06/12/2022   BUN 11 06/12/2022   NA 140 06/12/2022   K 4.7 06/12/2022   CL 97 06/12/2022   CO2 28 06/12/2022    Lab Results  Component Value Date   CHOL 278 (H) 02/15/2022   HDL 80 02/15/2022   LDLCALC 170 (H) 02/15/2022   TRIG 155 (H) 02/15/2022   CHOLHDL 3.5 02/15/2022    Medications Reviewed Today     Reviewed by HOsker Mason RPH-CPP (Pharmacist) on 08/07/22 at 1544  Med List Status: <None>   Medication Order Taking? Sig Documenting Provider  Last Dose Status Informant  acetaminophen (TYLENOL) 325 MG tablet [id number] Yes Take 2 tablets (650 mg total) by mouth every 6 (six) hours as needed. Lovorn, Jinny Blossom, MD Taking Active   albuterol (VENTOLIN HFA) 108 (90 Base) MCG/ACT inhaler [id number] Yes USE 2 INHALATIONS BY MOUTH  INTO THE LUNGS EVERY 4  HOURS AS NEEDED FOR  WHEEZING OR SHORTNESS OF  BREATH  Patient taking differently: 2 puffs every 4 (four) hours as needed  for wheezing or shortness of breath.   Vevelyn Francois, NP Taking Active Self  amLODipine (NORVASC) 10 MG tablet [id number] Yes TAKE 1 TABLET(10 MG) BY MOUTH DAILY Fenton Foy, NP Taking Active   aspirin EC 81 MG tablet [id number] Yes Take 1 tablet (81 mg total) by mouth daily. Swallow whole. Marylu Lund., NP Taking Active   atorvastatin (LIPITOR) 40 MG tablet [id number] No Take 1 tablet (40 mg total) by mouth daily.  Patient not taking: Reported on 08/07/2022   Bo Merino I, NP Not Taking Active   carvedilol (COREG) 25 MG tablet [id number] Yes Take 1 tablet (25 mg total) by mouth 2 (two) times daily. Marylu Lund., NP Taking Active   cetirizine (ZYRTEC) 10 MG tablet [id number] Yes Take 1 tablet (10 mg total) by mouth in the morning. Vevelyn Francois, NP Taking Active Self  cloNIDine (CATAPRES) 0.1 MG tablet [id number] No Take 1 tablet (0.1 mg total) by mouth 2 (two) times daily.  Patient not taking: Reported on 08/07/2022   Bo Merino I, NP Not Taking Expired 06/27/22 2359   Continuous Blood Gluc Receiver (FREESTYLE LIBRE 2 READER) DEVI [id number] Yes Use as directed daily. Vevelyn Francois, NP Taking Active   Continuous Blood Gluc Sensor (FREESTYLE LIBRE 2 SENSOR) [place] [id number] Yes 1 application by Does not apply route every 14 (fourteen) days. Vevelyn Francois, NP Taking Active   DULoxetine (CYMBALTA) 60 MG capsule [id number] No Take 1 capsule (60 mg total) by mouth daily.  Patient not taking: Reported on 08/07/2022   Courtney Heys, MD Not Taking Active   fluticasone [facility]) 50 MCG/ACT nasal spray [id number] Yes Place 1 spray into both nostrils daily. [provider] Taking Active Self  furosemide (LASIX) 40 MG tablet [id number] Yes Take 1 tablet (40 mg total) by mouth 2 (two) times daily. Fenton Foy, NP Taking Active   gabapentin (NEURONTIN) 300 MG capsule [id number] Yes Take 1 capsule (300 mg total) by mouth 3 (three) times daily. Fenton Foy, NP Taking  Active            Med Note Jodi Mourning, Toney Reil Aug 07, 2022  3:38 PM) Taking twice daily  glipiZIDE (GLUCOTROL) 10 MG tablet [id number] No Take 1 tablet (10 mg total) by mouth 2 (two) times daily.  Patient not taking: Reported on 08/07/2022   Fenton Foy, NP Not Taking Active   insulin aspart (NOVOLOG) 100 UNIT/ML injection [id number] Yes Inject 0-15 Units into the skin 3 (three) times daily with meals. Sliding Scale as noted below CBG <70: treat low blood sugar CBG 70-120: 0 units CBG 121-150: 2 units novolog CBG 151-200: 3 units novolog CBG 201-250: 5 units novolog CBG 251-300: 8 units novolog CBG 301-350: 11 units novolog CBG 351-400: 15 units novology CBG >400: call MD Elodia Florence., MD Taking Active            Med Note Bertrand, Toney Reil Aug 07, 2022  3:43 PM) 20  units before meals if high  insulin glargine (LANTUS) 100 UNIT/ML injection [id number] Yes Inject 0.15 mLs (15 Units total) into the skin at bedtime. Elodia Florence., MD Taking Active            Med Note Bonesteel, Toney Reil Aug 07, 2022  3:43 PM) 30 units PRN  ipratropium-albuterol (DUONEB) 0.5-2.5 (3) MG/3ML SOLN [id number]  Take 3 mLs by nebulization every 6 (six) hours as needed (shortness of breath, wheeze). Azzie Glatter, FNP  Expired 11/14/21 2359 Self  linaclotide (LINZESS) 72 MCG capsule [id number] No Take 1 capsule (72 mcg total) by mouth daily before breakfast.  Patient not taking: Reported on 08/07/2022   Bo Merino I, NP Not Taking Active   metFORMIN (GLUCOPHAGE) 1000 MG tablet [id number] Yes Take 1 tablet (1,000 mg total) by mouth 2 (two) times daily with a meal. Fenton Foy, NP Taking Active   methocarbamol (ROBAXIN) 750 MG tablet [id number] No Take 1 tablet (750 mg total) by mouth 3 (three) times daily as needed for muscle spasms.  Patient not taking: Reported on 08/07/2022   Azzie Glatter, FNP Not Taking Active Self  ondansetron [facility]) 4 MG tablet  [id number] Yes Take 1 tablet (4 mg total) by mouth every 8 (eight) hours as needed for nausea or vomiting. Lovorn, Jinny Blossom, MD Taking Active Self  sacubitril-valsartan (ENTRESTO) 97-103 MG [id number] Yes Take 1 tablet by mouth 2 (two) times daily. [provider] Taking Active   spironolactone (ALDACTONE) 25 MG tablet [id number] No TAKE 1 TABLET (25 MG TOTAL) BY MOUTH DAILY.  Patient not taking: Reported on 08/07/2022   Fenton Foy, NP Not Taking Active   traMADol (ULTRAM) 50 MG tablet [id number]  Take 1 tablet (50 mg total) by mouth every 6 (six) hours as needed for moderate pain or severe pain. Lovorn, Jinny Blossom, MD  Active               Assessment/Plan:   Diabetes: - Currently uncontrolled and with opportunity for improvement in regimen. Will focus on getting medications refilled first today, plan to incorporate GLP1/SGLT2 moving forward as able - Recommend to continue current regimen. Discussed refill needs with PCP, she is in agreement. Orders placed.  - Recommend to check glucose continuously using CGM  Hyperlipidemia/ASCVD Risk Reduction: - Currently uncontrolled.  - Recommend to refill atorvastatin 40 mg daily. Will collaborate with cardiology.   Heart Failure: - Currently opportunity for optimization - Recommend to refill spironolactone, continue carvedilol, amlodipine, entresto. Will hold on requesting clonidine refill as patient has not taken in a while. Patient aware she needs to reschedule follow up with cardiology. Addendum: discussed with Ambrose Pancoast, NP. He requests PCP continue to refill these medications. Discussed with PCP, she is in agreement. Refills placed.  - Will call to follow up on home BP readings in 3 weeks.   Follow Up Plan: phone call in 3 weeks  Catie TJodi Mourning, PharmD, [facility] [phone]

## 2022-08-07 NOTE — Progress Notes (Signed)
Patient requests refills to [facility] on [address]. Last seen by Barbarann Ehlers on 06/12/22. Patient missed October follow up, but is aware to reschedule.   Requesting refills on:  - Carvedilol 25 mg  - Entresto 97/103 mg - Amlodipine 10 mg  - Spironolactone 25 mg  - recently ran out - Atorvastatin 40 mg   Reports BP at home remains ~170s, but she has run out of spironolactone. I am calling to follow up on home BP in ~ 3 weeks  Catie Hedwig Morton, PharmD, [facility] [phone]

## 2022-08-09 ENCOUNTER — Ambulatory Visit (AMBULATORY_SURGERY_CENTER): Payer: Self-pay | Admitting: *Deleted

## 2022-08-09 VITALS — Ht 66.0 in | Wt 296.8 lb

## 2022-08-09 DIAGNOSIS — Z1211 Encounter for screening for malignant neoplasm of colon: Secondary | ICD-10-CM

## 2022-08-09 MED ORDER — CARVEDILOL 25 MG PO TABS: 25.0000 mg | ORAL_TABLET | Freq: Two times a day (BID) | ORAL | 0 refills | Status: DC

## 2022-08-09 MED ORDER — ATORVASTATIN CALCIUM 40 MG PO TABS: 40.0000 mg | ORAL_TABLET | Freq: Every day | ORAL | 0 refills | Status: DC

## 2022-08-09 MED ORDER — AMLODIPINE BESYLATE 10 MG PO TABS: 10.0000 mg | ORAL_TABLET | Freq: Every day | ORAL | 0 refills | Status: DC

## 2022-08-09 MED ORDER — ENTRESTO 97-103 MG PO TABS: 1.0000 | ORAL_TABLET | Freq: Two times a day (BID) | ORAL | 0 refills | Status: DC

## 2022-08-09 MED ORDER — NA SULFATE-K SULFATE-MG SULF 17.5-3.13-1.6 GM/177ML PO SOLN: 1.0000 | Freq: Once | ORAL | 0 refills | Status: AC

## 2022-08-09 MED ORDER — SPIRONOLACTONE 25 MG PO TABS: ORAL_TABLET | Freq: Every day | ORAL | 0 refills | Status: DC

## 2022-08-09 NOTE — Progress Notes (Signed)
No egg or soy allergy known to patient  No issues known to pt with past sedation with any surgeries or procedures Patient denies ever being told they had issues or difficulty with intubation  No FH of Malignant Hyperthermia Pt is not on diet pills Pt is not on  home 02  Pt is not on blood thinners  Pt has constipation issues- 2 day Suprep/Miralax Pt encouraged to use to use Singlecare or Goodrx to reduce cost

## 2022-08-09 NOTE — Addendum Note (Signed)
Addended by: Kaylyn Layer T on: 08/09/2022 01:21 PM   Modules accepted: Orders

## 2022-08-11 DIAGNOSIS — E113293 Type 2 diabetes mellitus with mild nonproliferative diabetic retinopathy without macular edema, bilateral: Secondary | ICD-10-CM | POA: Diagnosis not present

## 2022-09-03 DIAGNOSIS — Z794 Long term (current) use of insulin: Secondary | ICD-10-CM | POA: Diagnosis not present

## 2022-09-03 DIAGNOSIS — E119 Type 2 diabetes mellitus without complications: Secondary | ICD-10-CM | POA: Diagnosis not present

## 2022-09-06 ENCOUNTER — Encounter: Payer: Self-pay | Admitting: Internal Medicine

## 2022-09-06 ENCOUNTER — Other Ambulatory Visit: Payer: Medicare Other | Admitting: Pharmacist

## 2022-09-06 ENCOUNTER — Ambulatory Visit (AMBULATORY_SURGERY_CENTER): Payer: Medicare Other | Admitting: Internal Medicine

## 2022-09-06 VITALS — BP 135/68 | HR 88 | Temp 97.1°F | Resp 20 | Ht 66.0 in | Wt 296.0 lb

## 2022-09-06 DIAGNOSIS — J449 Chronic obstructive pulmonary disease, unspecified: Secondary | ICD-10-CM | POA: Diagnosis not present

## 2022-09-06 DIAGNOSIS — D124 Benign neoplasm of descending colon: Secondary | ICD-10-CM

## 2022-09-06 DIAGNOSIS — D123 Benign neoplasm of transverse colon: Secondary | ICD-10-CM | POA: Diagnosis not present

## 2022-09-06 DIAGNOSIS — E119 Type 2 diabetes mellitus without complications: Secondary | ICD-10-CM | POA: Diagnosis not present

## 2022-09-06 DIAGNOSIS — G4733 Obstructive sleep apnea (adult) (pediatric): Secondary | ICD-10-CM | POA: Diagnosis not present

## 2022-09-06 DIAGNOSIS — K635 Polyp of colon: Secondary | ICD-10-CM

## 2022-09-06 DIAGNOSIS — Z1211 Encounter for screening for malignant neoplasm of colon: Secondary | ICD-10-CM

## 2022-09-06 DIAGNOSIS — I10 Essential (primary) hypertension: Secondary | ICD-10-CM | POA: Diagnosis not present

## 2022-09-06 MED ORDER — LINACLOTIDE 145 MCG PO CAPS: 145.0000 ug | ORAL_CAPSULE | Freq: Every day | ORAL | 3 refills | Status: DC

## 2022-09-06 MED ORDER — SODIUM CHLORIDE 0.9 % IV SOLN: 500.0000 mL | Freq: Once | INTRAVENOUS | Status: DC

## 2022-09-06 NOTE — Progress Notes (Signed)
Agree with the resident's plan and documentation.   Catie Hedwig Morton, PharmD, [facility] [phone]

## 2022-09-06 NOTE — Chronic Care Management (AMB) (Signed)
09/06/2022 Name: Sharon Swanson MRN: [id number] DOB: 1961-03-06  Chief Complaint  Patient presents with   Medication Management    Diabetes, Hypertension    Sharon Swanson is a 61 y.o. year old female who presented for a telephone visit.   They were referred to the pharmacist by their PCP for assistance in managing diabetes and hypertension.   Subjective:  Care Team: Primary Care Provider: Bo Merino I, NP ; Next Scheduled Visit: 09/28/2022  Medication Access/Adherence  Current Pharmacy:  [facility] [place], [place] - [address] AT [address] [place] [address] Phone: [phone] Fax: [phone]   Patient reports affordability concerns with their medications: No  Patient reports access/transportation concerns to their pharmacy: No  Patient reports adherence concerns with their medications:  Yes  did not pick up atorvastatin 40 mg daily   Diabetes:  Current medications: metformin 1000 mg twice daily, Lantus 30 units (prescribed as 20 units); Novolog per sliding scale, though reports she takes 20 units "if elevated"   Uses Libre CGM: 120-180s, some 200-240s  Heart Failure, preserved EF (02/2021 EF 60-65%):   Current medications:  ACEi/ARB/ARNI: Entresto 97/103 twice daily SGLT2i: none, consider moving forward Beta blocker: carvedilol 25 mg twice daily Mineralocorticoid Receptor Antagonist: spironolactone 25 mg daily - received refill  Diuretic regimen: furosemide 40 mg twice daily Additional antihypertensives: amlodipine 10 mg daily   Current home blood pressure readings: reports BP is 'high' but is unable to provide exact numbers.   Hyperlipidemia/ASCVD Risk Reduction  Current lipid lowering medications: atorvastatin 40 mg daily - has not picked up new prescription yet   Antiplatelet regimen: aspirin 81 mg daily   Health Maintenance  Health Maintenance Due  Topic Date Due   FOOT EXAM  Never done    OPHTHALMOLOGY EXAM  Never done   Hepatitis C Screening  Never done   MAMMOGRAM  Never done   Zoster Vaccines- Shingrix (1 of 2) Never done   Diabetic kidney evaluation - Urine ACR  01/18/2021   INFLUENZA VACCINE  04/25/2022   COVID-19 Vaccine (3 - 2023-24 season) 05/26/2022   Medicare Annual Wellness (AWV)  06/08/2022   COLONOSCOPY (Pts 45-36yr Insurance coverage will need to be confirmed)  07/18/2022   HEMOGLOBIN A1C  08/18/2022     Objective: Lab Results  Component Value Date   HGBA1C 7.3 (A) 02/15/2022   HGBA1C 7.3 02/15/2022   HGBA1C 7.3 (A) 02/15/2022   HGBA1C 7.3 (A) 02/15/2022    Lab Results  Component Value Date   CREATININE 0.72 06/12/2022   BUN 11 06/12/2022   NA 140 06/12/2022   K 4.7 06/12/2022   CL 97 06/12/2022   CO2 28 06/12/2022    Lab Results  Component Value Date   CHOL 278 (H) 02/15/2022   HDL 80 02/15/2022   LDLCALC 170 (H) 02/15/2022   TRIG 155 (H) 02/15/2022   CHOLHDL 3.5 02/15/2022    Medications Reviewed Today     Reviewed by SPauletta Browns [facility](Pharmacist) on 09/06/22 at [phone] Med List Status: <None>   Medication Order Taking? Sig Documenting Provider Last Dose Status Informant  acetaminophen (TYLENOL) 325 MG tablet [id number] Take 2 tablets (650 mg total) by mouth every 6 (six) hours as needed. Lovorn, MJinny Blossom MD Taking Active   albuterol (VENTOLIN HFA) 108 (90 Base) MCG/ACT inhaler [id number] USE 2 INHALATIONS BY MOUTH  INTO THE LUNGS EVERY 4  HOURS AS NEEDED FOR  WHEEZING OR SHORTNESS OF  BREATH  Patient taking differently: 2 puffs every 4 (four) hours as needed for wheezing or shortness of breath.   Vevelyn Francois, NP Taking Active Self  amLODipine (NORVASC) 10 MG tablet [id number] Yes Take 1 tablet (10 mg total) by mouth daily. Fenton Foy, NP Taking Active   amoxicillin (AMOXIL) 875 MG tablet [id number] No Take 875 mg by mouth 2 (two) times daily. For dental procedure  Patient not taking: Reported on 09/06/2022    [provider] Not Taking Active   aspirin EC 81 MG tablet [id number] Yes Take 1 tablet (81 mg total) by mouth daily. Swallow whole. Marylu Lund., NP Taking Active   atorvastatin (LIPITOR) 40 MG tablet [id number] No Take 1 tablet (40 mg total) by mouth daily.  Patient not taking: Reported on 09/06/2022   Fenton Foy, NP Not Taking Active   carvedilol (COREG) 25 MG tablet [id number] Yes Take 1 tablet (25 mg total) by mouth 2 (two) times daily. Fenton Foy, NP Taking Active   cetirizine (ZYRTEC) 10 MG tablet [id number] Yes Take 1 tablet (10 mg total) by mouth in the morning. Vevelyn Francois, NP Taking Active Self  cloNIDine (CATAPRES) 0.1 MG tablet [id number]  Take 1 tablet (0.1 mg total) by mouth 2 (two) times daily.  Patient not taking: Reported on 08/07/2022   Bo Merino I, NP  Expired 06/27/22 2359   Continuous Blood Gluc Receiver (FREESTYLE LIBRE 2 READER) [place] [id number] Yes Use as directed daily. Vevelyn Francois, NP Taking Active   Continuous Blood Gluc Sensor (FREESTYLE LIBRE 2 SENSOR) [place] [id number] Yes 1 application by Does not apply route every 14 (fourteen) days. Vevelyn Francois, NP Taking Active   DULoxetine (CYMBALTA) 60 MG capsule [id number] Yes Take 1 capsule (60 mg total) by mouth daily. Lovorn, Jinny Blossom, MD Taking Active   fluticasone (FLONASE) 50 MCG/ACT nasal spray [id number] Yes Place 1 spray into both nostrils daily. [provider] Taking Active Self  furosemide (LASIX) 40 MG tablet [id number] Yes Take 1 tablet (40 mg total) by mouth 2 (two) times daily. Fenton Foy, NP Taking Active   gabapentin (NEURONTIN) 300 MG capsule [id number] Yes Take 1 capsule (300 mg total) by mouth 3 (three) times daily. Fenton Foy, NP Taking Active            Med Note Jodi Mourning, Toney Reil Aug 07, 2022  3:38 PM) Taking twice daily  insulin aspart (NOVOLOG) 100 UNIT/ML injection [id number] Yes Inject 0-15 Units into the skin 3 (three) times daily with meals.  Sliding Scale as noted below CBG <70: treat low blood sugar CBG 70-120: 0 units CBG 121-150: 2 units novolog CBG 151-200: 3 units novolog CBG 201-250: 5 units novolog CBG 251-300: 8 units novolog CBG 301-350: 11 units novolog CBG 351-400: 15 units novolog CBG >400: call MD Fenton Foy, NP Taking Active   insulin glargine (LANTUS) 100 UNIT/ML injection [id number] Yes Inject 0.2 mLs (20 Units total) into the skin at bedtime. Fenton Foy, NP Taking Active   ipratropium-albuterol (DUONEB) 0.5-2.5 (3) MG/3ML SOLN [id number]  Take 3 mLs by nebulization every 6 (six) hours as needed (shortness of breath, wheeze). Azzie Glatter, FNP  Expired 11/14/21 2359 Self  linaclotide (LINZESS) 72 MCG capsule [id number] Yes Take 1 capsule (72 mcg total) by mouth daily before breakfast. Bo Merino I, NP Taking Active   metFORMIN (GLUCOPHAGE) 1000 MG tablet [id number] Yes Take 1 tablet (1,000 mg total) by mouth  2 (two) times daily with a meal. Fenton Foy, NP Taking Active   methocarbamol (ROBAXIN) 750 MG tablet [id number] No Take 1 tablet (750 mg total) by mouth 3 (three) times daily as needed for muscle spasms.  Patient not taking: Reported on 08/07/2022   Azzie Glatter, FNP Not Taking Active Self  ondansetron [facility]) 4 MG tablet [id number] Yes Take 1 tablet (4 mg total) by mouth every 8 (eight) hours as needed for nausea or vomiting. Lovorn, Jinny Blossom, MD Taking Active Self  sacubitril-valsartan (ENTRESTO) 97-103 MG [id number] Yes Take 1 tablet by mouth 2 (two) times daily. Fenton Foy, NP Taking Active   spironolactone (ALDACTONE) 25 MG tablet [id number] Yes TAKE 1 TABLET (25 MG TOTAL) BY MOUTH DAILY. Fenton Foy, NP Taking Active   traMADol Veatrice Bourbon) 50 MG tablet [id number] Yes Take 1 tablet (50 mg total) by mouth every 6 (six) hours as needed for moderate pain or severe pain. Lovorn, Jinny Blossom, MD Taking Active               Assessment/Plan:   Diabetes: - Currently uncontrolled  per patient report. Last A1c close to goal (7.3%). Will focus on patient taking medications consistently today, especially mealtime insulin. - Recommend to continue current regimen. Discussed with patient the importance of taking her meal time insulin before each meal to prevent postprandial hyperglycemia. Reviewed sliding scale instructions.  - Consider adding SGLT2i at follow up given concurrent HFpEF.  - Recommend to check glucose continuously using CGM   Hyperlipidemia/ASCVD Risk Reduction: - Currently uncontrolled.  - Recommend to pick up atorvastatin 40 mg daily that was recently refilled. Patient endorsees understanding.    Heart Failure (02/2021 EF 60-65%): - Currently opportunity for optimization - Requested patient document home BP readings and will follow up  - Will call to follow up on home BP readings after PCP visit.  - Consider adding SGLT2i at follow up given concurrent diabetes.   Follow Up Plan:  PCP Visit: 09/28/2022 Pharmacist: 11/09/2022  Joseph Art, Pharm.D. PGY-2 Ambulatory Care Pharmacy Resident 09/06/2022 11:19 AM

## 2022-09-06 NOTE — Op Note (Signed)
[place] Patient Name: Sharon Swanson Procedure Date: 09/06/2022 1:59 PM MRN: [id number] Endoscopist: Jerene Bears , MD, [id number] Age: 61 Referring MD:  Date of Birth: 1961-02-17 Gender: Female Account #: [id number] Procedure:                Colonoscopy Indications:              Screening for colorectal malignant neoplasm, Last                            colonoscopy 10 years ago Medicines:                Monitored Anesthesia Care Procedure:                Pre-Anesthesia Assessment:                           - Prior to the procedure, a History and Physical                            was performed, and patient medications and                            allergies were reviewed. The patient's tolerance of                            previous anesthesia was also reviewed. The risks                            and benefits of the procedure and the sedation                            options and risks were discussed with the patient.                            All questions were answered, and informed consent                            was obtained. Prior Anticoagulants: The patient has                            taken no anticoagulant or antiplatelet agents. ASA                            Grade Assessment: III - A patient with severe                            systemic disease. After reviewing the risks and                            benefits, the patient was deemed in satisfactory                            condition to undergo the procedure.  After obtaining informed consent, the colonoscope                            was passed under direct vision. Throughout the                            procedure, the patient's blood pressure, pulse, and                            oxygen saturations were monitored continuously. The                            CF [id number] #[id number] was introduced through the anus                            and advanced to the cecum,  identified by                            appendiceal orifice and ileocecal valve. The                            colonoscopy was performed without difficulty. The                            patient tolerated the procedure well. The quality                            of the bowel preparation was good. The ileocecal                            valve, appendiceal orifice, and rectum were                            photographed. Scope In: 2:11:47 PM Scope Out: 2:27:46 PM Total Procedure Duration: 0 hours 15 minutes 59 seconds  Findings:                 Skin tags were found on perianal exam.                           A 6 mm polyp was found in the transverse colon. The                            polyp was sessile. The polyp was removed with a                            cold snare. Resection and retrieval were complete.                           An 8 mm polyp was found in the descending colon.                            The polyp was sessile. The polyp was removed with a  cold snare. Resection and retrieval were complete.                           A 5 mm polyp was found in the sigmoid colon. The                            polyp was sessile. The polyp was removed with a                            cold snare. Resection and retrieval were complete.                           Multiple large-mouthed and small-mouthed                            diverticula were found in the sigmoid colon,                            descending colon, transverse colon, ascending colon                            and cecum.                           External and internal hemorrhoids were found during                            retroflexion. The hemorrhoids were medium-sized. Complications:            No immediate complications. Estimated Blood Loss:     Estimated blood loss was minimal. Impression:               - Perianal skin tags found on perianal exam.                           - One 6 mm  polyp in the transverse colon, removed                            with a cold snare. Resected and retrieved.                           - One 8 mm polyp in the descending colon, removed                            with a cold snare. Resected and retrieved.                           - One 5 mm polyp in the sigmoid colon, removed with                            a cold snare. Resected and retrieved.                           - Moderate diverticulosis in the sigmoid colon, in  the descending colon, in the transverse colon, in                            the ascending colon and in the cecum.                           - External and internal hemorrhoids. Recommendation:           - Patient has a contact number available for                            emergencies. The signs and symptoms of potential                            delayed complications were discussed with the                            patient. Return to normal activities tomorrow.                            Written discharge instructions were provided to the                            patient.                           - Resume previous diet.                           - Continue present medications.                           - Await pathology results.                           - Trial of Linzess 145 mcg daily for chronic                            constipation which she reports has been an issue                            since umbilical hernia repair earlier this year. If                            not helpful or diarrhea results please call my                            office.                           - Repeat colonoscopy is recommended. The                            colonoscopy date will be determined after pathology  results from today's exam become available for                            review. Jerene Bears, MD 09/06/2022 2:34:30 PM This report has been signed  electronically.

## 2022-09-06 NOTE — Progress Notes (Signed)
GASTROENTEROLOGY PROCEDURE H&P NOTE   Primary Care Physician: Bo Merino I, NP    Reason for Procedure:  Colon cancer screening  Plan:    Colonoscopy  Patient is appropriate for endoscopic procedure(s) in the ambulatory ([facility]) setting.  The nature of the procedure, as well as the risks, benefits, and alternatives were carefully and thoroughly reviewed with the patient. Ample time for discussion and questions allowed. The patient understood, was satisfied, and agreed to proceed.     HPI: Sharon Swanson is a 61 y.o. female who presents for screening colonoscopy.  Colonoscopy 10 years ago with distal hyperplastic polyps only.  Medical history as below.  Tolerated the prep.  No recent chest pain or shortness of breath.  No abdominal pain today.  Past Medical History:  Diagnosis Date   Abnormal Pap smear of cervix 10/2020   Acid reflux    Allergy    Asthma    Atypical squamous cells of undetermined significance (ASC-US) on cervical Pap smear 10/2020   Bilateral lower extremity edema 01/2020   COPD (chronic obstructive pulmonary disease) (HCC)    CPAP (continuous positive airway pressure) dependence    Diabetes ([place])    Diabetes mellitus without complication ([place])    Type II   Fatty liver 11/08/2011   Fibroids 11/08/2011   Hyperglycemia    Hyperlipidemia 05/2020   Hypertension    Microalbuminuria 01/2020   Obesity (BMI 30-39.9) 11/08/2011   Osteoarthritis of right acromioclavicular joint    Pancreatitis 11/08/2011   Rotator cuff tear, right    Shortness of breath on exertion 01/2020   Sleep apnea    Ventral hernia    Vitamin D deficiency 05/2020    Past Surgical History:  Procedure Laterality Date   COLONOSCOPY     ORTHOPEDIC SURGERY     R ankle, tendonitis   UMBILICAL HERNIA REPAIR N/A 09/30/2021   Procedure: INCARCERATED HERNIA REPAIR UMBILICAL ADULT;  Surgeon: Dwan Bolt, MD;  Location: [facility];  Service: General;  Laterality: N/A;    Prior to  Admission medications   Medication Sig Start Date End Date Taking? Authorizing Provider  albuterol (VENTOLIN HFA) 108 (90 Base) MCG/ACT inhaler USE 2 INHALATIONS BY MOUTH  INTO THE LUNGS EVERY 4  HOURS AS NEEDED FOR  WHEEZING OR SHORTNESS OF  BREATH Patient taking differently: 2 puffs every 4 (four) hours as needed for wheezing or shortness of breath. 12/27/20  Yes Vevelyn Francois, NP  amLODipine (NORVASC) 10 MG tablet Take 1 tablet (10 mg total) by mouth daily. 08/09/22  Yes Fenton Foy, NP  aspirin EC 81 MG tablet Take 1 tablet (81 mg total) by mouth daily. Swallow whole. 06/12/22  Yes Marylu Lund., NP  atorvastatin (LIPITOR) 40 MG tablet Take 1 tablet (40 mg total) by mouth daily. 08/09/22  Yes Fenton Foy, NP  carvedilol (COREG) 25 MG tablet Take 1 tablet (25 mg total) by mouth 2 (two) times daily. 08/09/22  Yes Fenton Foy, NP  cetirizine (ZYRTEC) 10 MG tablet Take 1 tablet (10 mg total) by mouth in the morning. 03/21/21  Yes Vevelyn Francois, NP  Continuous Blood Gluc Receiver (FREESTYLE LIBRE 2 READER) DEVI Use as directed daily. 11/11/21  Yes Vevelyn Francois, NP  Continuous Blood Gluc Sensor (FREESTYLE LIBRE 2 SENSOR) MISC 1 application by Does not apply route every 14 (fourteen) days. 11/11/21  Yes Vevelyn Francois, NP  DULoxetine (CYMBALTA) 60 MG capsule Take 1 capsule (60 mg total) by  mouth daily. 12/07/21  Yes Lovorn, Jinny Blossom, MD  fluticasone (FLONASE) 50 MCG/ACT nasal spray Place 1 spray into both nostrils daily. 03/21/21  Yes [provider]  furosemide (LASIX) 40 MG tablet Take 1 tablet (40 mg total) by mouth 2 (two) times daily. 05/10/22  Yes Fenton Foy, NP  gabapentin (NEURONTIN) 300 MG capsule Take 1 capsule (300 mg total) by mouth 3 (three) times daily. 05/10/22  Yes Fenton Foy, NP  insulin aspart (NOVOLOG) 100 UNIT/ML injection Inject 0-15 Units into the skin 3 (three) times daily with meals. Sliding Scale as noted below CBG <70: treat low blood  sugar CBG 70-120: 0 units CBG 121-150: 2 units novolog CBG 151-200: 3 units novolog CBG 201-250: 5 units novolog CBG 251-300: 8 units novolog CBG 301-350: 11 units novolog CBG 351-400: 15 units novolog CBG >400: call MD 08/07/22  Yes Fenton Foy, NP  insulin glargine (LANTUS) 100 UNIT/ML injection Inject 0.2 mLs (20 Units total) into the skin at bedtime. 08/07/22  Yes Fenton Foy, NP  LANTUS SOLOSTAR 100 UNIT/ML Solostar Pen Inject 20 Units into the skin at bedtime. 08/09/22  Yes [provider]  metFORMIN (GLUCOPHAGE) 1000 MG tablet Take 1 tablet (1,000 mg total) by mouth 2 (two) times daily with a meal. 08/07/22  Yes Fenton Foy, NP  ondansetron (ZOFRAN) 4 MG tablet Take 1 tablet (4 mg total) by mouth every 8 (eight) hours as needed for nausea or vomiting. 09/07/21  Yes Lovorn, Megan, MD  sacubitril-valsartan (ENTRESTO) 97-103 MG Take 1 tablet by mouth 2 (two) times daily. 08/09/22  Yes Fenton Foy, NP  spironolactone (ALDACTONE) 25 MG tablet TAKE 1 TABLET (25 MG TOTAL) BY MOUTH DAILY. 08/09/22  Yes Fenton Foy, NP  traMADol (ULTRAM) 50 MG tablet Take 1 tablet (50 mg total) by mouth every 6 (six) hours as needed for moderate pain or severe pain. 05/19/22  Yes Lovorn, Jinny Blossom, MD  acetaminophen (TYLENOL) 325 MG tablet Take 2 tablets (650 mg total) by mouth every 6 (six) hours as needed. 03/06/22   Lovorn, Jinny Blossom, MD  amoxicillin (AMOXIL) 875 MG tablet Take 875 mg by mouth 2 (two) times daily. For dental procedure Patient not taking: Reported on 09/06/2022 07/31/22   [provider]  cloNIDine (CATAPRES) 0.1 MG tablet Take 1 tablet (0.1 mg total) by mouth 2 (two) times daily. Patient not taking: Reported on 08/07/2022 03/29/22 06/27/22  Bo Merino I, NP  ipratropium-albuterol (DUONEB) 0.5-2.5 (3) MG/3ML SOLN Take 3 mLs by nebulization every 6 (six) hours as needed (shortness of breath, wheeze). 10/20/20 11/14/21  Azzie Glatter, FNP  linaclotide  Rolan Lipa) 72 MCG capsule Take 1 capsule (72 mcg total) by mouth daily before breakfast. Patient not taking: Reported on 09/06/2022 03/29/22   Bo Merino I, NP  methocarbamol (ROBAXIN) 750 MG tablet Take 1 tablet (750 mg total) by mouth 3 (three) times daily as needed for muscle spasms. Patient not taking: Reported on 08/07/2022 10/20/20   Azzie Glatter, FNP    Current Outpatient Medications  Medication Sig Dispense Refill   albuterol (VENTOLIN HFA) 108 (90 Base) MCG/ACT inhaler USE 2 INHALATIONS BY MOUTH  INTO THE LUNGS EVERY 4  HOURS AS NEEDED FOR  WHEEZING OR SHORTNESS OF  BREATH (Patient taking differently: 2 puffs every 4 (four) hours as needed for wheezing or shortness of breath.) 51 g 3   amLODipine (NORVASC) 10 MG tablet Take 1 tablet (10 mg total) by mouth daily. 90 tablet 0  aspirin EC 81 MG tablet Take 1 tablet (81 mg total) by mouth daily. Swallow whole. 90 tablet 3   atorvastatin (LIPITOR) 40 MG tablet Take 1 tablet (40 mg total) by mouth daily. 90 tablet 0   carvedilol (COREG) 25 MG tablet Take 1 tablet (25 mg total) by mouth 2 (two) times daily. 180 tablet 0   cetirizine (ZYRTEC) 10 MG tablet Take 1 tablet (10 mg total) by mouth in the morning. 90 tablet 3   Continuous Blood Gluc Receiver (FREESTYLE LIBRE 2 READER) DEVI Use as directed daily. 1 each 1   Continuous Blood Gluc Sensor (FREESTYLE LIBRE 2 SENSOR) MISC 1 application by Does not apply route every 14 (fourteen) days. 2 each 11   DULoxetine (CYMBALTA) 60 MG capsule Take 1 capsule (60 mg total) by mouth daily. 30 capsule 5   fluticasone (FLONASE) 50 MCG/ACT nasal spray Place 1 spray into both nostrils daily.     furosemide (LASIX) 40 MG tablet Take 1 tablet (40 mg total) by mouth 2 (two) times daily. 180 tablet 3   gabapentin (NEURONTIN) 300 MG capsule Take 1 capsule (300 mg total) by mouth 3 (three) times daily. 270 capsule 3   insulin aspart (NOVOLOG) 100 UNIT/ML injection Inject 0-15 Units into the skin 3 (three)  times daily with meals. Sliding Scale as noted below CBG <70: treat low blood sugar CBG 70-120: 0 units CBG 121-150: 2 units novolog CBG 151-200: 3 units novolog CBG 201-250: 5 units novolog CBG 251-300: 8 units novolog CBG 301-350: 11 units novolog CBG 351-400: 15 units novolog CBG >400: call MD 15 mL 1   insulin glargine (LANTUS) 100 UNIT/ML injection Inject 0.2 mLs (20 Units total) into the skin at bedtime. 15 mL 1   LANTUS SOLOSTAR 100 UNIT/ML Solostar Pen Inject 20 Units into the skin at bedtime.     metFORMIN (GLUCOPHAGE) 1000 MG tablet Take 1 tablet (1,000 mg total) by mouth 2 (two) times daily with a meal. 180 tablet 1   ondansetron (ZOFRAN) 4 MG tablet Take 1 tablet (4 mg total) by mouth every 8 (eight) hours as needed for nausea or vomiting. 60 tablet 0   sacubitril-valsartan (ENTRESTO) 97-103 MG Take 1 tablet by mouth 2 (two) times daily. 180 tablet 0   spironolactone (ALDACTONE) 25 MG tablet TAKE 1 TABLET (25 MG TOTAL) BY MOUTH DAILY. 90 tablet 0   traMADol (ULTRAM) 50 MG tablet Take 1 tablet (50 mg total) by mouth every 6 (six) hours as needed for moderate pain or severe pain. 120 tablet 2   acetaminophen (TYLENOL) 325 MG tablet Take 2 tablets (650 mg total) by mouth every 6 (six) hours as needed.     amoxicillin (AMOXIL) 875 MG tablet Take 875 mg by mouth 2 (two) times daily. For dental procedure (Patient not taking: Reported on 09/06/2022)     cloNIDine (CATAPRES) 0.1 MG tablet Take 1 tablet (0.1 mg total) by mouth 2 (two) times daily. (Patient not taking: Reported on 08/07/2022) 180 tablet 0   ipratropium-albuterol (DUONEB) 0.5-2.5 (3) MG/3ML SOLN Take 3 mLs by nebulization every 6 (six) hours as needed (shortness of breath, wheeze). 360 mL 12   linaclotide (LINZESS) 72 MCG capsule Take 1 capsule (72 mcg total) by mouth daily before breakfast. (Patient not taking: Reported on 09/06/2022) 30 capsule 0   methocarbamol (ROBAXIN) 750 MG tablet Take 1 tablet (750 mg total) by mouth 3  (three) times daily as needed for muscle spasms. (Patient not taking: Reported on 08/07/2022) 270  tablet 3   Current Facility-Administered Medications  Medication Dose Route Frequency Provider Last Rate Last Admin   0.9 %  sodium chloride infusion  500 mL Intravenous Once Nikolas Casher, Lajuan Lines, MD        Allergies as of 09/06/2022 - Review Complete 09/06/2022  Allergen Reaction Noted   Aspirin Other (See Comments) 10/21/2011    Family History  Problem Relation Age of Onset   Hypertension Mother    Diabetes Maternal Grandmother    Hypertension Maternal Grandmother    Diabetes Other    Colon cancer Neg Hx    Esophageal cancer Neg Hx    Rectal cancer Neg Hx    Stomach cancer Neg Hx     Social History   Socioeconomic History   Marital status: Married    Spouse name: Not on file   Number of children: Not on file   Years of education: Not on file   Highest education level: Not on file  Occupational History   Not on file  Tobacco Use   Smoking status: Every Day    Packs/day: 0.10    Years: 39.00    Total pack years: 3.90    Types: Cigarettes    Start date: 09/25/1980   Smokeless tobacco: Never   Tobacco comments:    pt states she smoke 2 cigerettes a day. States she uses nicotine patches.  Vaping Use   Vaping Use: Never used  Substance and Sexual Activity   Alcohol use: No   Drug use: No   Sexual activity: Not Currently    Partners: Male    Birth control/protection: Post-menopausal  Other Topics Concern   Not on file  Social History Narrative   Not on file   Social Determinants of Health   Financial Resource Strain: Medium Risk (06/08/2021)   Overall Financial Resource Strain (CARDIA)    Difficulty of Paying Living Expenses: Somewhat hard  Food Insecurity: No Food Insecurity (06/08/2021)   Hunger Vital Sign    Worried About Running Out of Food in the Last Year: Never true    Ran Out of Food in the Last Year: Never true  Transportation Needs: No Transportation Needs  (06/08/2021)   PRAPARE - Hydrologist (Medical): No    Lack of Transportation (Non-Medical): No  Physical Activity: Inactive (06/08/2021)   Exercise Vital Sign    Days of Exercise per Week: 0 days    Minutes of Exercise per Session: 0 min  Stress: Stress Concern Present (06/08/2021)   [place]    Feeling of Stress : To some extent  Social Connections: Socially Integrated (06/08/2021)   Social Connection and Isolation Panel [NHANES]    Frequency of Communication with Friends and Family: Three times a week    Frequency of Social Gatherings with Friends and Family: Three times a week    Attends Religious Services: More than 4 times per year    Active Member of Clubs or Organizations: Yes    Attends Archivist Meetings: 1 to 4 times per year    Marital Status: Married  Human resources officer Violence: Not At Risk (06/08/2021)   Humiliation, Afraid, Rape, and Kick questionnaire    Fear of Current or Ex-Partner: No    Emotionally Abused: No    Physically Abused: No    Sexually Abused: No    Physical Exam: Vital signs in last 24 hours: '@BP'$  (!) 143/80 (BP Location: Right Arm, Patient  Position: Sitting, Cuff Size: Normal)   Pulse 87   Temp (!) 97.1 F (36.2 C) (Temporal)   Ht '5\' 6"'$  (1.676 m)   Wt 296 lb (134.3 kg)   SpO2 95%   BMI 47.78 kg/m  GEN: NAD EYE: Sclerae anicteric ENT: MMM CV: Non-tachycardic Pulm: CTA b/l GI: Soft, NT/ND NEURO:  Alert & Oriented x 3   Zenovia Jarred, MD [facility]  09/06/2022 2:01 PM

## 2022-09-06 NOTE — Progress Notes (Signed)
Vitals-DT  Pt's states no medical or surgical changes since previsit or office visit.  

## 2022-09-06 NOTE — Patient Instructions (Signed)
Please read handouts provided. Continue present medications. Await pathology results. Trial of Linzess 145 mcg daily for chronic constipation. If not helpful or diarrhea results call Dr. Vena Rua office.  YOU HAD AN ENDOSCOPIC PROCEDURE TODAY AT [facility]:   Refer to the procedure report that was given to you for any specific questions about what was found during the examination.  If the procedure report does not answer your questions, please call your gastroenterologist to clarify.  If you requested that your care partner not be given the details of your procedure findings, then the procedure report has been included in a sealed envelope for you to review at your convenience later.  YOU SHOULD EXPECT: Some feelings of bloating in the abdomen. Passage of more gas than usual.  Walking can help get rid of the air that was put into your GI tract during the procedure and reduce the bloating. If you had a lower endoscopy (such as a colonoscopy or flexible sigmoidoscopy) you may notice spotting of blood in your stool or on the toilet paper. If you underwent a bowel prep for your procedure, you may not have a normal bowel movement for a few days.  Please Note:  You might notice some irritation and congestion in your nose or some drainage.  This is from the oxygen used during your procedure.  There is no need for concern and it should clear up in a day or so.  SYMPTOMS TO REPORT IMMEDIATELY:  Following lower endoscopy (colonoscopy or flexible sigmoidoscopy):  Excessive amounts of blood in the stool  Significant tenderness or worsening of abdominal pains  Swelling of the abdomen that is new, acute  Fever of 100F or higher.  For urgent or emergent issues, a gastroenterologist can be reached at any hour by calling [phone]. Do not use MyChart messaging for urgent concerns.    DIET:  We do recommend a small meal at first, but then you may proceed to your regular diet.  Drink  plenty of fluids but you should avoid alcoholic beverages for 24 hours.  ACTIVITY:  You should plan to take it easy for the rest of today and you should NOT DRIVE or use heavy machinery until tomorrow (because of the sedation medicines used during the test).    FOLLOW UP: Our staff will call the number listed on your records the next business day following your procedure.  We will call around 7:15- 8:00 am to check on you and address any questions or concerns that you may have regarding the information given to you following your procedure. If we do not reach you, we will leave a message.     If any biopsies were taken you will be contacted by phone or by letter within the next 1-3 weeks.  Please call us at [phone] if you have not heard about the biopsies in 3 weeks.    SIGNATURES/CONFIDENTIALITY: You and/or your care partner have signed paperwork which will be entered into your electronic medical record.  These signatures attest to the fact that that the information above on your After Visit Summary has been reviewed and is understood.  Full responsibility of the confidentiality of this discharge information lies with you and/or your care-partner.

## 2022-09-06 NOTE — Patient Instructions (Addendum)
It was nice talking to you today!  Please pick up atorvastatin (Liptor) 40 mg daily from [employer]. This medication is for your cholesterol and helps prevent things like a heart attack or stroke. This is a very important medicine!   Continue Lantus 20 units daily.  It is very important to take your meal time insulin (Novolog) before every meal to prevent high blood sugars after you eat. Please see below for instructions on how many units of Novolog to use prior to each meal.   Novolog Sliding Scale Instructions:  CBG <70: treat low blood sugar  CBG 70-120: 0 units  CBG 121-150: 2 units novolog  CBG 151-200: 3 units novolog  CBG 201-250: 5 units novolog  CBG 251-300: 8 units novolog  CBG 301-350: 11 units novolog  CBG 351-400: 15 units novolog  CBG >400: call MD - Subcutaneous    I will give you a call on 11/09/2022 to see how things are going.   Have a happy holiday season!   Best, Joseph Art, PharmD

## 2022-09-06 NOTE — Progress Notes (Signed)
Report to PACU, RN, vss, BBS= Clear.  

## 2022-09-06 NOTE — Progress Notes (Signed)
Called to room to assist during endoscopic procedure.  Patient ID and intended procedure confirmed with present staff. Received instructions for my participation in the procedure from the performing physician.  

## 2022-09-07 ENCOUNTER — Telehealth: Payer: Self-pay

## 2022-09-07 NOTE — Telephone Encounter (Signed)
  Follow up Call-     09/06/2022    1:04 PM 09/06/2022   12:56 PM  Call back number  Post procedure Call Back phone  # [phone]   Permission to leave phone message  Yes     Patient questions:  Do you have a fever, pain , or abdominal swelling? No. Pain Score  0 *  Have you tolerated food without any problems? Yes.    Have you been able to return to your normal activities? Yes.    Do you have any questions about your discharge instructions: Diet   No. Medications  No. Follow up visit  No.  Do you have questions or concerns about your Care? No.  Actions: * If pain score is 4 or above: No action needed, pain <4.

## 2022-09-13 ENCOUNTER — Encounter: Payer: Self-pay | Admitting: Internal Medicine

## 2022-09-28 ENCOUNTER — Ambulatory Visit: Payer: Self-pay | Admitting: Nurse Practitioner

## 2022-11-07 ENCOUNTER — Other Ambulatory Visit: Payer: Medicare Other | Admitting: Pharmacist

## 2022-11-07 DIAGNOSIS — I10 Essential (primary) hypertension: Secondary | ICD-10-CM

## 2022-11-07 DIAGNOSIS — J45909 Unspecified asthma, uncomplicated: Secondary | ICD-10-CM

## 2022-11-07 DIAGNOSIS — E785 Hyperlipidemia, unspecified: Secondary | ICD-10-CM

## 2022-11-07 DIAGNOSIS — E1165 Type 2 diabetes mellitus with hyperglycemia: Secondary | ICD-10-CM

## 2022-11-07 DIAGNOSIS — R062 Wheezing: Secondary | ICD-10-CM

## 2022-11-07 DIAGNOSIS — R7309 Other abnormal glucose: Secondary | ICD-10-CM

## 2022-11-07 DIAGNOSIS — R600 Localized edema: Secondary | ICD-10-CM

## 2022-11-07 DIAGNOSIS — R0602 Shortness of breath: Secondary | ICD-10-CM

## 2022-11-07 DIAGNOSIS — R739 Hyperglycemia, unspecified: Secondary | ICD-10-CM

## 2022-11-07 MED ORDER — INSULIN ASPART 100 UNIT/ML IJ SOLN: 12.0000 [IU] | Freq: Two times a day (BID) | INTRAMUSCULAR | 0 refills | Status: DC

## 2022-11-07 MED ORDER — GABAPENTIN 300 MG PO CAPS: 300.0000 mg | ORAL_CAPSULE | Freq: Three times a day (TID) | ORAL | 0 refills | Status: DC

## 2022-11-07 MED ORDER — CARVEDILOL 25 MG PO TABS: 25.0000 mg | ORAL_TABLET | Freq: Two times a day (BID) | ORAL | 0 refills | Status: DC

## 2022-11-07 MED ORDER — FREESTYLE LIBRE 2 READER DEVI: 1.0000 "application " | Freq: Every day | 3 refills | Status: AC

## 2022-11-07 MED ORDER — DULOXETINE HCL 60 MG PO CPEP: 60.0000 mg | ORAL_CAPSULE | Freq: Every day | ORAL | 0 refills | Status: DC

## 2022-11-07 MED ORDER — LINACLOTIDE 145 MCG PO CAPS: 145.0000 ug | ORAL_CAPSULE | Freq: Every day | ORAL | 0 refills | Status: DC

## 2022-11-07 MED ORDER — ENTRESTO 97-103 MG PO TABS: 1.0000 | ORAL_TABLET | Freq: Two times a day (BID) | ORAL | 0 refills | Status: DC

## 2022-11-07 MED ORDER — TRULICITY 0.75 MG/0.5ML ~~LOC~~ SOAJ: 0.7500 mg | SUBCUTANEOUS | 0 refills | Status: DC

## 2022-11-07 MED ORDER — SPIRONOLACTONE 25 MG PO TABS: ORAL_TABLET | Freq: Every day | ORAL | 0 refills | Status: DC

## 2022-11-07 MED ORDER — METFORMIN HCL 1000 MG PO TABS: 1000.0000 mg | ORAL_TABLET | Freq: Two times a day (BID) | ORAL | 0 refills | Status: DC

## 2022-11-07 MED ORDER — FLUTICASONE PROPIONATE 50 MCG/ACT NA SUSP: 1.0000 | Freq: Every day | NASAL | 0 refills | Status: DC

## 2022-11-07 MED ORDER — ATORVASTATIN CALCIUM 40 MG PO TABS: 40.0000 mg | ORAL_TABLET | Freq: Every day | ORAL | 0 refills | Status: DC

## 2022-11-07 MED ORDER — FUROSEMIDE 40 MG PO TABS: 40.0000 mg | ORAL_TABLET | Freq: Two times a day (BID) | ORAL | 0 refills | Status: DC

## 2022-11-07 MED ORDER — INSULIN GLARGINE 100 UNIT/ML ~~LOC~~ SOLN: 24.0000 [IU] | Freq: Every day | SUBCUTANEOUS | 0 refills | Status: DC

## 2022-11-07 MED ORDER — ALBUTEROL SULFATE HFA 108 (90 BASE) MCG/ACT IN AERS: 2.0000 | INHALATION_SPRAY | RESPIRATORY_TRACT | 0 refills | Status: DC | PRN

## 2022-11-07 MED ORDER — AMLODIPINE BESYLATE 10 MG PO TABS: 10.0000 mg | ORAL_TABLET | Freq: Every day | ORAL | 0 refills | Status: DC

## 2022-11-07 NOTE — Progress Notes (Signed)
11/07/2022 Name: Sharon Swanson MRN: [id number] DOB: 1961-02-02  Chief Complaint  Patient presents with   Medication Management   Hypertension   Diabetes   Hyperlipidemia    Sharon Swanson is a 62 y.o. year old female who presented for a telephone visit.   They were referred to the pharmacist by a quality report for assistance in managing hypertension.    Subjective:  Care Team: Primary Care Provider: Teena Dunk, NP ; Next Scheduled Visit: none  Medication Access/Adherence  Current Pharmacy:  [facility] [place], [place] [address] [place] [address] Phone: [phone] Fax: [phone]   Patient reports affordability concerns with their medications: No  Patient reports access/transportation concerns to their pharmacy: No  Patient reports adherence concerns with their medications:  Yes  reports she needs refills on all medications today.    Diabetes:  Current medications: metformin 1000 mg twice daily, reports a prior prescription for Trulicity [id number] mg weekly; Lantus 30 units daily, Humalog 30 units twice daily - reports this was most recent filled at the pharmacy instead of Novolog, she is unsure why  Using [place] 2 sensor + reader (notes she doesn't use her smart phone enough to be comfortable with the phone app).   Reports that since running out of insulin, fasting readings have been 200-300. Reports that on above doses of insulin, fastings more in 150-200s  Patient denies hypoglycemic s/sx including dizziness, shakiness, sweating.  Current meal patterns: reports general pattern of two meals daily  Hyperlipidemia/ASCVD Risk Reduction  Current lipid lowering medications: atorvastatin 40 mg daily  Antiplatelet regimen: aspirin 81 mg daily  Heart Failure:  Current medications:  ACEi/ARB/ARNI: Entresto 97/103 mg twice daily SGLT2i: none, consider moving forward Beta blocker: carvedilol 25 mg  twice daily Mineralocorticoid Receptor Antagonist: spironolactone 25 mg daily Diuretic regimen: furosemide 40 mg twice daily Additional antihypertensive: amlodipine 10 mg daily  Current home blood pressure readings: reports she has a machine but has not been checking recently; vitals at colonoscopy appear to be controlled  COPD: Current medications: albuterol HFA PRN  - reports currently having to use twice daily  Per chart review, history of montelukast, Symbicort, Stiolto, Dulera  PFT 11/2016: moderate restriction, air trapping. Little impact after bronchodilators. Current smoker, not assessed today.   Previously seen by pulmonary who recommended maintenance of Stiolto inhaler.   Chronic Pain: Current medications: duloxetine 60 mg daily - has been out for several months; gabapentin 300 mg three times daily - reports she takes twice daily; methocarbamol 750 mg twice daily PRN; tramadol 50 mg twice daily  Chronic Constipation: Current medications: linzess 145 mcg daily - patient reports benefit with this medication  Objective:  Lab Results  Component Value Date   HGBA1C 7.3 (A) 02/15/2022   HGBA1C 7.3 02/15/2022   HGBA1C 7.3 (A) 02/15/2022   HGBA1C 7.3 (A) 02/15/2022    Lab Results  Component Value Date   CREATININE 0.72 06/12/2022   BUN 11 06/12/2022   NA 140 06/12/2022   K 4.7 06/12/2022   CL 97 06/12/2022   CO2 28 06/12/2022    Lab Results  Component Value Date   CHOL 278 (H) 02/15/2022   HDL 80 02/15/2022   LDLCALC 170 (H) 02/15/2022   TRIG 155 (H) 02/15/2022   CHOLHDL 3.5 02/15/2022    Medications Reviewed Today     Reviewed by Osker Mason, RPH-CPP (Pharmacist) on 11/07/22 at 0855  Med List Status: <None>  Medication Order Taking? Sig Documenting Provider Last Dose Status Informant  0.9 %  sodium chloride infusion [id number]   Pyrtle, Lajuan Lines, MD  Active   acetaminophen (TYLENOL) 325 MG tablet [id number] Yes Take 2 tablets (650 mg total) by mouth  every 6 (six) hours as needed. Lovorn, Jinny Blossom, MD Taking Active   albuterol (VENTOLIN HFA) 108 (90 Base) MCG/ACT inhaler [id number] Yes USE 2 INHALATIONS BY MOUTH  INTO THE LUNGS EVERY 4  HOURS AS NEEDED FOR  WHEEZING OR SHORTNESS OF  BREATH  Patient taking differently: 2 puffs every 4 (four) hours as needed for wheezing or shortness of breath.   Vevelyn Francois, NP Taking Active Self  amLODipine (NORVASC) 10 MG tablet [id number] Yes Take 1 tablet (10 mg total) by mouth daily. Fenton Foy, NP Taking Active   amoxicillin (AMOXIL) 875 MG tablet [id number] No Take 875 mg by mouth 2 (two) times daily. For dental procedure  Patient not taking: Reported on 09/06/2022   [provider] Not Taking Active   aspirin EC 81 MG tablet [id number] Yes Take 1 tablet (81 mg total) by mouth daily. Swallow whole. Marylu Lund., NP Taking Active   atorvastatin (LIPITOR) 40 MG tablet [id number] Yes Take 1 tablet (40 mg total) by mouth daily. Fenton Foy, NP Taking Active   carvedilol (COREG) 25 MG tablet [id number] Yes Take 1 tablet (25 mg total) by mouth 2 (two) times daily. Fenton Foy, NP Taking Active   cetirizine (ZYRTEC) 10 MG tablet [id number] Yes Take 1 tablet (10 mg total) by mouth in the morning. Vevelyn Francois, NP Taking Active Self  Continuous Blood Gluc Receiver (FREESTYLE LIBRE 2 READER) DEVI [id number]  Use as directed daily. Vevelyn Francois, NP  Active   Continuous Blood Gluc Sensor (FREESTYLE LIBRE 2 SENSOR) [place] [id number] Yes 1 application by Does not apply route every 14 (fourteen) days. Vevelyn Francois, NP Taking Active   DULoxetine (CYMBALTA) 60 MG capsule [id number] No Take 1 capsule (60 mg total) by mouth daily.  Patient not taking: Reported on 11/07/2022   Courtney Heys, MD Not Taking Active   fluticasone [facility]) 50 MCG/ACT nasal spray [id number] No Place 1 spray into both nostrils daily.  Patient not taking: Reported on 11/07/2022   [provider] Not Taking Active  Self  furosemide (LASIX) 40 MG tablet [id number] Yes Take 1 tablet (40 mg total) by mouth 2 (two) times daily. Fenton Foy, NP Taking Active   gabapentin (NEURONTIN) 300 MG capsule [id number] Yes Take 1 capsule (300 mg total) by mouth 3 (three) times daily. Fenton Foy, NP Taking Active            Med Note Jodi Mourning, Toney Reil Aug 07, 2022  3:38 PM) Taking twice daily  insulin aspart (NOVOLOG) 100 UNIT/ML injection [id number]  Inject 0-15 Units into the skin 3 (three) times daily with meals. Sliding Scale as noted below CBG <70: treat low blood sugar CBG 70-120: 0 units CBG 121-150: 2 units novolog CBG 151-200: 3 units novolog CBG 201-250: 5 units novolog CBG 251-300: 8 units novolog CBG 301-350: 11 units novolog CBG 351-400: 15 units novolog CBG >400: call MD Fenton Foy, NP  Active            Med Note Jodi Mourning, Kraig Gen[place] T   Tue Nov 07, 2022  8:53 AM) Humalog 30 units twice daily with meals  insulin glargine (LANTUS) 100 UNIT/ML injection [id number] Yes Inject 0.2  mLs (20 Units total) into the skin at bedtime. Fenton Foy, NP Taking Active   ipratropium-albuterol (DUONEB) 0.5-2.5 (3) MG/3ML SOLN [id number] No Take 3 mLs by nebulization every 6 (six) hours as needed (shortness of breath, wheeze).  Patient not taking: Reported on 11/07/2022   Azzie Glatter, FNP Not Taking Expired 11/14/21 2359 Self  linaclotide (LINZESS) 145 MCG CAPS capsule [id number] Yes Take 1 capsule (145 mcg total) by mouth daily before breakfast. Pyrtle, Lajuan Lines, MD Taking Active   metFORMIN (GLUCOPHAGE) 1000 MG tablet [id number] Yes Take 1 tablet (1,000 mg total) by mouth 2 (two) times daily with a meal. Fenton Foy, NP Taking Active   methocarbamol (ROBAXIN) 750 MG tablet [id number] Yes Take 1 tablet (750 mg total) by mouth 3 (three) times daily as needed for muscle spasms. Azzie Glatter, FNP Taking Active Self           Med Note Jodi Mourning, [place] T   Tue Nov 07, 2022  8:54 AM) Twice daily   ondansetron (ZOFRAN) 4 MG tablet [id number] Yes Take 1 tablet (4 mg total) by mouth every 8 (eight) hours as needed for nausea or vomiting. Lovorn, Jinny Blossom, MD Taking Active Self  sacubitril-valsartan (ENTRESTO) 97-103 MG [id number] Yes Take 1 tablet by mouth 2 (two) times daily. Fenton Foy, NP Taking Active   spironolactone (ALDACTONE) 25 MG tablet [id number] Yes TAKE 1 TABLET (25 MG TOTAL) BY MOUTH DAILY. Fenton Foy, NP Taking Active   traMADol (ULTRAM) 50 MG tablet [id number]  Take 1 tablet (50 mg total) by mouth every 6 (six) hours as needed for moderate pain or severe pain. Lovorn, Jinny Blossom, MD  Active               Assessment/Plan:   Diabetes: - Currently uncontrolled due to medication access issues.  - Agree with patient to restart Trulicity. She denied worsening of constipation with this. Continue metformin 1000 mg twice daily. Recommend to reduce Lantus to 24 units daily and Novolog to 12 units twice daily to reduce risk of hypoglycemia with starting Trulicity. Discussed with NP Paseda, orders placed for co-sign. Refills placed for [place] sensors as well.  - Recommend to add SGLT2 in the future given HFpEF.  - Continue to collaborate to increase GLP1 as tolerated, reduce insulin. Could consider change to Ozempic in the future for greater potency and weight loss.    Hyperlipidemia/ASCVD Risk Reduction: - Currently uncontrolled due to lack of access - Discussed with NP Paseda. Refill placed for atorvastatin. Recommend goal LDL <70.    Heart Failure: - Currently inappropriately managed/opportunity for optimization given access issues - Discussed with NP Paseda, refills placed. Recommend to continue current regimen. Monitor weights, home BP. Reduce amlodipine as needed/able. Recommend initiation of SGLT2 moving forward.  - Patient to follow up with cardiology as recommended. Patient to call [facility] to schedule follow up.    COPD: - Currently uncontrolled due to lack of  maintenance inhaler.  - Recommend to restart Stiolto for COPD to reduce frequency of need for rescue therapy; did not discuss with patient today due to time constraints. Will discuss tobacco cessation with patient moving forward.   Chronic Pain: - Currently uncontrolled due to medication access - Discussed with NP Paseda, refills placed  Chronic Constipation: - Controlled with current regimen. Discussed with NP Paseda, refills placed  Follow Up Plan: PCP in 4 weeks, Pharmacy in 8 weeks  Catie TJodi Mourning, PharmD, [place], [facility] [phone]

## 2022-11-09 ENCOUNTER — Other Ambulatory Visit: Payer: Medicare Other | Admitting: Pharmacist

## 2022-11-27 ENCOUNTER — Ambulatory Visit: Payer: Self-pay | Admitting: Nurse Practitioner

## 2022-11-27 ENCOUNTER — Ambulatory Visit (INDEPENDENT_AMBULATORY_CARE_PROVIDER_SITE_OTHER): Payer: Medicare Other | Admitting: Nurse Practitioner

## 2022-11-27 VITALS — BP 133/87 | HR 91 | Ht 67.0 in | Wt 295.6 lb

## 2022-11-27 DIAGNOSIS — Z13220 Encounter for screening for lipoid disorders: Secondary | ICD-10-CM | POA: Diagnosis not present

## 2022-11-27 DIAGNOSIS — Z794 Long term (current) use of insulin: Secondary | ICD-10-CM | POA: Diagnosis not present

## 2022-11-27 DIAGNOSIS — E1165 Type 2 diabetes mellitus with hyperglycemia: Secondary | ICD-10-CM | POA: Diagnosis not present

## 2022-11-27 LAB — POCT GLYCOSYLATED HEMOGLOBIN (HGB A1C): Hemoglobin A1C: 9.5 % — AB (ref 4.0–5.6)

## 2022-11-27 MED ORDER — LINACLOTIDE 145 MCG PO CAPS: 145.0000 ug | ORAL_CAPSULE | Freq: Every day | ORAL | 0 refills | Status: DC

## 2022-11-27 MED ORDER — INSULIN ASPART 100 UNIT/ML IJ SOLN: 12.0000 [IU] | Freq: Two times a day (BID) | INTRAMUSCULAR | 0 refills | Status: DC

## 2022-11-27 NOTE — Assessment & Plan Note (Signed)
-   POCT glycosylated hemoglobin (Hb A1C) - Ambulatory referral to Endocrinology - CBC - Comprehensive metabolic panel - Lipid Panel - insulin aspart (NOVOLOG) 100 UNIT/ML injection; Inject 12 Units into the skin 2 (two) times daily with a meal.  Dispense: 15 mL; Refill: 0  2. Lipid screening  - Lipid Panel   Follow up:  Follow up in 3 months

## 2022-11-27 NOTE — Progress Notes (Signed)
$'@Patient'Q$  ID: Sharon Swanson, female    DOB: 1961-02-04, 62 y.o.   MRN: [id number]  Chief Complaint  Patient presents with   Follow-up    Pt is taking linzess would like to check. Pt did had a colonoscopy couple months ago.    Referring provider: Teena Dunk, NP  HPI   Sharon Swanson 62 y.o. female  has a past medical history of Abnormal Pap smear of cervix (10/2020), Acid reflux, Asthma, Atypical squamous cells of undetermined significance (ASC-US) on cervical Pap smear (10/2020), Bilateral lower extremity edema (01/2020), COPD (chronic obstructive pulmonary disease) ([place]), CPAP (continuous positive airway pressure) dependence, Diabetes ([place]), Diabetes mellitus without complication ([place]), Fatty liver (11/08/2011), Fibroids (11/08/2011), Hyperglycemia, Hyperlipidemia (05/2020), Hypertension, Microalbuminuria (01/2020), Obesity (BMI 30-39.9) (11/08/2011), Osteoarthritis of right acromioclavicular joint, Pancreatitis (11/08/2011), Rotator cuff tear, right, Shortness of breath on exertion (01/2020), Sleep apnea, Ventral hernia, and Vitamin D deficiency (05/2020). To the [facility] for reevaluation of chronic illness.    Hypertension: Patient here for follow-up of elevated blood pressure. She is exercising and is adherent to low salt diet.  Blood pressure is not well controlled at home. Cardiac symptoms fatigue. Patient denies chest pain, chest pressure/discomfort, and palpitations.  Cardiovascular risk factors: diabetes mellitus, hypertension, and obesity (BMI >= 30 kg/m2). Use of agents associated with hypertension: none. History of target organ damage: none.      Diabetes Mellitus: Patient presents for follow up of diabetes. Symptoms: none. . Patient denies foot ulcerations, hypoglycemia , nausea, and paresthesia of the feet.  Evaluation to date has been included: hemoglobin A1C. Patient's blood sugar is low in office today. Treatment to date: no recent interventions. A1C in office today was 9.5.   However patient's blood sugar was low this morning.  She states that she recently had a dental procedure and she has not been able to eat solid foods.  Her blood sugars have been low over the weekend.  We discussed that she does need to eat 6 small meals throughout the day to keep blood sugar stable.  Patient was previously followed by endocrinology for diabetic medication management.  We will refer her back today. Denies f/c/s, n/v/d, hemoptysis, PND, leg swelling Denies chest pain or edema   Denies f/c/s, n/v/d, hemoptysis, PND, leg swelling Denies chest pain or edema    Allergies  Allergen Reactions   Aspirin Other (See Comments)    Abdominal pain Abdominal pain Abdominal pain Abdominal pain Abdominal pain Abdominal pain Abdominal pain    Immunization History  Administered Date(s) Administered   DTaP 05/28/2020   Influenza,inj,Quad PF,6+ Mos 08/18/2014, 06/20/2016, 05/28/2020   Influenza-Unspecified 08/18/2014, 06/19/2016   Moderna Sars-Covid-2 Vaccination 12/12/2019, 01/13/2020   Pneumococcal Polysaccharide-23 08/18/2014, 04/25/2016, 06/28/2020   Pneumococcal-Unspecified 05/29/2016    Past Medical History:  Diagnosis Date   Abnormal Pap smear of cervix 10/2020   Acid reflux    Allergy    Asthma    Atypical squamous cells of undetermined significance (ASC-US) on cervical Pap smear 10/2020   Bilateral lower extremity edema 01/2020   COPD (chronic obstructive pulmonary disease) (HCC)    CPAP (continuous positive airway pressure) dependence    Diabetes ([place])    Diabetes mellitus without complication ([place])    Type II   Fatty liver 11/08/2011   Fibroids 11/08/2011   Hyperglycemia    Hyperlipidemia 05/2020   Hypertension    Microalbuminuria 01/2020   Obesity (BMI 30-39.9) 11/08/2011   Osteoarthritis of right acromioclavicular joint    Pancreatitis 11/08/2011  Rotator cuff tear, right    Shortness of breath on exertion 01/2020   Sleep apnea    Ventral hernia     Vitamin D deficiency 05/2020    Tobacco History: Social History   Tobacco Use  Smoking Status Every Day   Packs/day: 0.10   Years: 39.00   Total pack years: 3.90   Types: Cigarettes   Start date: 09/25/1980  Smokeless Tobacco Never  Tobacco Comments   pt states she smoke 2 cigerettes a day. States she uses nicotine patches.   Ready to quit: Not Answered Counseling given: Not Answered Tobacco comments: pt states she smoke 2 cigerettes a day. States she uses nicotine patches.   Outpatient Encounter Medications as of 11/27/2022  Medication Sig   acetaminophen (TYLENOL) 325 MG tablet Take 2 tablets (650 mg total) by mouth every 6 (six) hours as needed.   albuterol (VENTOLIN HFA) 108 (90 Base) MCG/ACT inhaler Inhale 2 puffs into the lungs every 4 (four) hours as needed for wheezing or shortness of breath.   amLODipine (NORVASC) 10 MG tablet Take 1 tablet (10 mg total) by mouth daily.   amoxicillin (AMOXIL) 875 MG tablet Take 875 mg by mouth 2 (two) times daily. For dental procedure   atorvastatin (LIPITOR) 40 MG tablet Take 1 tablet (40 mg total) by mouth daily.   carvedilol (COREG) 25 MG tablet Take 1 tablet (25 mg total) by mouth 2 (two) times daily.   cetirizine (ZYRTEC) 10 MG tablet Take 1 tablet (10 mg total) by mouth in the morning.   Continuous Blood Gluc Receiver (FREESTYLE LIBRE 2 READER) DEVI Use as directed daily.   Continuous Blood Gluc Sensor (FREESTYLE LIBRE 2 SENSOR) [place]ISC 1 application by Does not apply route every 14 (fourteen) days.   Dulaglutide (TRULICITY) A999333 0000000 SOPN Inject 0.75 mg into the skin once a week.   DULoxetine (CYMBALTA) 60 MG capsule Take 1 capsule (60 mg total) by mouth daily.   fluticasone (FLONASE) 50 MCG/ACT nasal spray Place 1 spray into both nostrils daily.   furosemide (LASIX) 40 MG tablet Take 1 tablet (40 mg total) by mouth 2 (two) times daily.   gabapentin (NEURONTIN) 300 MG capsule Take 1 capsule (300 mg total) by mouth 3 (three) times  daily.   ipratropium-albuterol (DUONEB) 0.5-2.5 (3) MG/3ML SOLN Take 3 mLs by nebulization every 6 (six) hours as needed (shortness of breath, wheeze).   metFORMIN (GLUCOPHAGE) 1000 MG tablet Take 1 tablet (1,000 mg total) by mouth 2 (two) times daily with a meal.   methocarbamol (ROBAXIN) 750 MG tablet Take 1 tablet (750 mg total) by mouth 3 (three) times daily as needed for muscle spasms.   ondansetron (ZOFRAN) 4 MG tablet Take 1 tablet (4 mg total) by mouth every 8 (eight) hours as needed for nausea or vomiting.   sacubitril-valsartan (ENTRESTO) 97-103 MG Take 1 tablet by mouth 2 (two) times daily.   spironolactone (ALDACTONE) 25 MG tablet TAKE 1 TABLET (25 MG TOTAL) BY MOUTH DAILY.   traMADol (ULTRAM) 50 MG tablet Take 1 tablet (50 mg total) by mouth every 6 (six) hours as needed for moderate pain or severe pain.   [DISCONTINUED] insulin aspart (NOVOLOG) 100 UNIT/ML injection Inject 12 Units into the skin 2 (two) times daily with a meal.   [DISCONTINUED] linaclotide (LINZESS) 145 MCG CAPS capsule Take 1 capsule (145 mcg total) by mouth daily before breakfast.   aspirin EC 81 MG tablet Take 1 tablet (81 mg total) by mouth daily. Swallow whole. (  Patient not taking: Reported on 11/27/2022)   insulin aspart (NOVOLOG) 100 UNIT/ML injection Inject 12 Units into the skin 2 (two) times daily with a meal.   insulin glargine (LANTUS) 100 UNIT/ML injection Inject 0.24 mLs (24 Units total) into the skin at bedtime. (Patient not taking: Reported on 11/27/2022)   linaclotide (LINZESS) 145 MCG CAPS capsule Take 1 capsule (145 mcg total) by mouth daily before breakfast.   Facility-Administered Encounter Medications as of 11/27/2022  Medication   0.9 %  sodium chloride infusion     Review of Systems  Review of Systems  Constitutional: Negative.   HENT: Negative.    Cardiovascular: Negative.   Gastrointestinal: Negative.   Allergic/Immunologic: Negative.   Neurological: Negative.   Psychiatric/Behavioral:  Negative.         Physical Exam  BP 133/87   Pulse 91   Ht '5\' 7"'$  (1.702 m)   Wt 295 lb 9.6 oz (134.1 kg)   SpO2 98%   BMI 46.30 kg/m   Wt Readings from Last 5 Encounters:  11/27/22 295 lb 9.6 oz (134.1 kg)  09/06/22 296 lb (134.3 kg)  08/09/22 296 lb 12.8 oz (134.6 kg)  06/22/22 292 lb (132.5 kg)  06/12/22 292 lb 6.4 oz (132.6 kg)     Physical Exam Vitals and nursing note reviewed.  Constitutional:      General: She is not in acute distress.    Appearance: She is well-developed.  Cardiovascular:     Rate and Rhythm: Normal rate and regular rhythm.  Pulmonary:     Effort: Pulmonary effort is normal.     Breath sounds: Normal breath sounds.  Neurological:     Mental Status: She is alert and oriented to person, place, and time.      Lab Results:  CBC    Component Value Date/Time   WBC 4.3 06/12/2022 0914   WBC 3.5 (L) 10/06/2021 0359   RBC 5.55 (H) 06/12/2022 0914   RBC 4.01 10/06/2021 0359   HGB 15.9 06/12/2022 0914   HCT 47.6 (H) 06/12/2022 0914   PLT 213 06/12/2022 0914   MCV 86 06/12/2022 0914   MCH 28.6 06/12/2022 0914   MCH 28.9 10/06/2021 0359   MCHC 33.4 06/12/2022 0914   MCHC 30.9 10/06/2021 0359   RDW 12.9 06/12/2022 0914   LYMPHSABS 0.9 10/06/2021 0359   LYMPHSABS 1.0 10/04/2020 1019   MONOABS 0.6 10/06/2021 0359   EOSABS 0.2 10/06/2021 0359   EOSABS 0.1 10/04/2020 1019   BASOSABS 0.0 10/06/2021 0359   BASOSABS 0.0 10/04/2020 1019    BMET    Component Value Date/Time   NA 140 06/12/2022 0914   K 4.7 06/12/2022 0914   CL 97 06/12/2022 0914   CO2 28 06/12/2022 0914   GLUCOSE 200 (H) 06/12/2022 0914   GLUCOSE 129 (H) 10/06/2021 0359   BUN 11 06/12/2022 0914   CREATININE 0.72 06/12/2022 0914   CALCIUM 9.6 06/12/2022 0914   GFRNONAA >60 10/06/2021 0359   GFRAA 111 10/04/2020 1019    BNP    Component Value Date/Time   BNP 119.1 (H) 08/02/2019 0610    ProBNP    Component Value Date/Time   PROBNP 11 02/02/2021 0923   PROBNP  33.0 03/01/2020 1432      Assessment & Plan:   Type 2 diabetes mellitus with hyperglycemia, with long-term current use of insulin (HCC) - POCT glycosylated hemoglobin (Hb A1C) - Ambulatory referral to Endocrinology - CBC - Comprehensive metabolic panel - Lipid Panel - insulin  aspart (NOVOLOG) 100 UNIT/ML injection; Inject 12 Units into the skin 2 (two) times daily with a meal.  Dispense: 15 mL; Refill: 0  2. Lipid screening  - Lipid Panel   Follow up:  Follow up in 3 months     Fenton Foy, NP 11/27/2022

## 2022-11-27 NOTE — Patient Instructions (Signed)
1. Type 2 diabetes mellitus with hyperglycemia, with long-term current use of insulin (HCC)  - POCT glycosylated hemoglobin (Hb A1C) - Ambulatory referral to Endocrinology - CBC - Comprehensive metabolic panel - Lipid Panel - insulin aspart (NOVOLOG) 100 UNIT/ML injection; Inject 12 Units into the skin 2 (two) times daily with a meal.  Dispense: 15 mL; Refill: 0  2. Lipid screening  - Lipid Panel   Follow up:  Follow up in 3 months

## 2022-11-28 ENCOUNTER — Other Ambulatory Visit: Payer: Self-pay

## 2022-11-28 LAB — CBC
Hematocrit: 47.5 % — ABNORMAL HIGH (ref 34.0–46.6)
Hemoglobin: 15.3 g/dL (ref 11.1–15.9)
MCH: 29.4 pg (ref 26.6–33.0)
MCHC: 32.2 g/dL (ref 31.5–35.7)
MCV: 91 fL (ref 79–97)
Platelets: 225 10*3/uL (ref 150–450)
RBC: 5.2 x10E6/uL (ref 3.77–5.28)
RDW: 13 % (ref 11.7–15.4)
WBC: 4.2 10*3/uL (ref 3.4–10.8)

## 2022-11-28 LAB — COMPREHENSIVE METABOLIC PANEL
ALT: 45 IU/L — ABNORMAL HIGH (ref 0–32)
AST: 26 IU/L (ref 0–40)
Albumin/Globulin Ratio: 1.4 (ref 1.2–2.2)
Albumin: 4.2 g/dL (ref 3.9–4.9)
Alkaline Phosphatase: 92 IU/L (ref 44–121)
BUN/Creatinine Ratio: 16 (ref 12–28)
BUN: 12 mg/dL (ref 8–27)
Bilirubin Total: 0.3 mg/dL (ref 0.0–1.2)
CO2: 25 mmol/L (ref 20–29)
Calcium: 9.8 mg/dL (ref 8.7–10.3)
Chloride: 101 mmol/L (ref 96–106)
Creatinine, Ser: 0.76 mg/dL (ref 0.57–1.00)
Globulin, Total: 2.9 g/dL (ref 1.5–4.5)
Glucose: 125 mg/dL — ABNORMAL HIGH (ref 70–99)
Potassium: 4.7 mmol/L (ref 3.5–5.2)
Sodium: 139 mmol/L (ref 134–144)
Total Protein: 7.1 g/dL (ref 6.0–8.5)
eGFR: 89 mL/min/{1.73_m2} (ref >59)

## 2022-11-28 LAB — LIPID PANEL
Chol/HDL Ratio: 3.2 ratio (ref 0.0–4.4)
Cholesterol, Total: 256 mg/dL — ABNORMAL HIGH (ref 100–199)
HDL: 79 mg/dL (ref >39)
LDL Chol Calc (NIH): 159 mg/dL — ABNORMAL HIGH (ref 0–99)
Triglycerides: 107 mg/dL (ref 0–149)
VLDL Cholesterol Cal: 18 mg/dL (ref 5–40)

## 2022-11-28 MED ORDER — INSULIN LISPRO 100 UNIT/ML IJ SOLN: 12.0000 [IU] | Freq: Three times a day (TID) | INTRAMUSCULAR | 3 refills | Status: DC

## 2022-11-30 ENCOUNTER — Telehealth: Payer: Self-pay | Admitting: Nurse Practitioner

## 2022-11-30 NOTE — Telephone Encounter (Signed)
Pt called and said that she was seen Monday and her BP is still HIGH and wants to know if she needs to take a different med.?   Please call her

## 2022-12-04 ENCOUNTER — Telehealth: Payer: Self-pay

## 2022-12-05 ENCOUNTER — Other Ambulatory Visit: Payer: Self-pay

## 2022-12-05 DIAGNOSIS — Z794 Long term (current) use of insulin: Secondary | ICD-10-CM

## 2022-12-05 MED ORDER — LINACLOTIDE 145 MCG PO CAPS: 145.0000 ug | ORAL_CAPSULE | Freq: Every day | ORAL | 0 refills | Status: DC

## 2022-12-05 NOTE — Progress Notes (Signed)
Called pt and inform message. [place]

## 2022-12-05 NOTE — Progress Notes (Signed)
Pt is schedule with Sharon Swanson for her next f/u apt. [place]

## 2022-12-06 ENCOUNTER — Other Ambulatory Visit: Payer: Self-pay | Admitting: Nurse Practitioner

## 2022-12-06 NOTE — Telephone Encounter (Signed)
Pt is schedule for a OV on the 12/13/22. [place]

## 2022-12-12 ENCOUNTER — Other Ambulatory Visit: Payer: Self-pay | Admitting: Nurse Practitioner

## 2022-12-12 DIAGNOSIS — R600 Localized edema: Secondary | ICD-10-CM

## 2022-12-12 DIAGNOSIS — I10 Essential (primary) hypertension: Secondary | ICD-10-CM

## 2022-12-12 DIAGNOSIS — E785 Hyperlipidemia, unspecified: Secondary | ICD-10-CM

## 2022-12-12 NOTE — Telephone Encounter (Signed)
Please advise KH 

## 2022-12-13 ENCOUNTER — Encounter: Payer: Self-pay | Admitting: Nurse Practitioner

## 2022-12-13 ENCOUNTER — Ambulatory Visit (INDEPENDENT_AMBULATORY_CARE_PROVIDER_SITE_OTHER): Payer: Medicare Other | Admitting: Nurse Practitioner

## 2022-12-13 VITALS — BP 159/73 | HR 95 | Temp 97.3°F | Wt 298.0 lb

## 2022-12-13 DIAGNOSIS — Z1231 Encounter for screening mammogram for malignant neoplasm of breast: Secondary | ICD-10-CM

## 2022-12-13 DIAGNOSIS — Z794 Long term (current) use of insulin: Secondary | ICD-10-CM | POA: Diagnosis not present

## 2022-12-13 DIAGNOSIS — R739 Hyperglycemia, unspecified: Secondary | ICD-10-CM

## 2022-12-13 DIAGNOSIS — E1165 Type 2 diabetes mellitus with hyperglycemia: Secondary | ICD-10-CM | POA: Diagnosis not present

## 2022-12-13 DIAGNOSIS — R7309 Other abnormal glucose: Secondary | ICD-10-CM

## 2022-12-13 LAB — POCT GLYCOSYLATED HEMOGLOBIN (HGB A1C): Hemoglobin A1C: 9.3 % — AB (ref 4.0–5.6)

## 2022-12-13 MED ORDER — LINACLOTIDE 145 MCG PO CAPS: 145.0000 ug | ORAL_CAPSULE | Freq: Every day | ORAL | 0 refills | Status: DC

## 2022-12-13 MED ORDER — INSULIN GLARGINE 100 UNIT/ML ~~LOC~~ SOLN: 20.0000 [IU] | Freq: Every day | SUBCUTANEOUS | 0 refills | Status: DC

## 2022-12-13 MED ORDER — INSULIN LISPRO 100 UNIT/ML IJ SOLN: 12.0000 [IU] | Freq: Two times a day (BID) | INTRAMUSCULAR | 3 refills | Status: DC

## 2022-12-13 MED ORDER — TRULICITY 0.75 MG/0.5ML ~~LOC~~ SOAJ: 0.7500 mg | SUBCUTANEOUS | 0 refills | Status: DC

## 2022-12-13 NOTE — Progress Notes (Signed)
@Patient  ID: Sharon Swanson, female    DOB: 05-25-1961, 62 y.o.   MRN: [id number]  Chief Complaint  Patient presents with   Blood Sugar Problem    Blood sugar running low.   Fatigue    Referring provider: Fenton Foy, NP   HPI  Sharon Swanson 62 y.o. female  has a past medical history of Abnormal Pap smear of cervix (10/2020), Acid reflux, Asthma, Atypical squamous cells of undetermined significance (ASC-US) on cervical Pap smear (10/2020), Bilateral lower extremity edema (01/2020), COPD (chronic obstructive pulmonary disease) ([place]), CPAP (continuous positive airway pressure) dependence, Diabetes ([place]), Diabetes mellitus without complication ([place]), Fatty liver (11/08/2011), Fibroids (11/08/2011), Hyperglycemia, Hyperlipidemia (05/2020), Hypertension, Microalbuminuria (01/2020), Obesity (BMI 30-39.9) (11/08/2011), Osteoarthritis of right acromioclavicular joint, Pancreatitis (11/08/2011), Rotator cuff tear, right, Shortness of breath on exertion (01/2020), Sleep apnea, Ventral hernia, and Vitamin D deficiency (05/2020).     Patient presents today for a follow-up on hypoglycemia.  She does have freestyle libre and blood sugar in office today is 78.  Patient is currently taking Lantus once daily at 20 units, Humalog 3 times daily at 15 units, Trulicity patient's [id number] in office today is 9.5.  Patient's libre readings show high readings most of the time.  She does have a couple times when she is dropped into the 60s.  We will decrease Lantus to 15 units daily and Humalog to twice daily.  We will have pharmacy to consult with her for medication management as well as refer her to endocrinology for medication management.  Patient did recently have teeth removed -  eating soft diet.  States that she has tried a high-protein diet. Denies f/c/s, n/v/d, hemoptysis, PND, leg swelling Denies chest pain or edema     Allergies  Allergen Reactions   Aspirin Other (See Comments)    Abdominal  pain Abdominal pain Abdominal pain Abdominal pain Abdominal pain Abdominal pain Abdominal pain    Immunization History  Administered Date(s) Administered   DTaP 05/28/2020   Influenza,inj,Quad PF,6+ Mos 08/18/2014, 06/20/2016, 05/28/2020   Influenza-Unspecified 08/18/2014, 06/19/2016   Moderna Sars-Covid-2 Vaccination 12/12/2019, 01/13/2020   Pneumococcal Polysaccharide-23 08/18/2014, 04/25/2016, 06/28/2020   Pneumococcal-Unspecified 05/29/2016    Past Medical History:  Diagnosis Date   Abnormal Pap smear of cervix 10/2020   Acid reflux    Allergy    Asthma    Atypical squamous cells of undetermined significance (ASC-US) on cervical Pap smear 10/2020   Bilateral lower extremity edema 01/2020   COPD (chronic obstructive pulmonary disease) ([facility])    CPAP (continuous positive airway pressure) dependence    Diabetes ([place])    Diabetes mellitus without complication ([place])    Type II   Fatty liver 11/08/2011   Fibroids 11/08/2011   Hyperglycemia    Hyperlipidemia 05/2020   Hypertension    Microalbuminuria 01/2020   Obesity (BMI 30-39.9) 11/08/2011   Osteoarthritis of right acromioclavicular joint    Pancreatitis 11/08/2011   Rotator cuff tear, right    Shortness of breath on exertion 01/2020   Sleep apnea    Ventral hernia    Vitamin D deficiency 05/2020    Tobacco History: Social History   Tobacco Use  Smoking Status Every Day   Packs/day: 0.10   Years: 39.00   Additional pack years: 0.00   Total pack years: 3.90   Types: Cigarettes   Start date: 09/25/1980  Smokeless Tobacco Never  Tobacco Comments   pt states she smoke 2 cigerettes a day. States she uses nicotine patches.  Ready to quit: Not Answered Counseling given: Not Answered Tobacco comments: pt states she smoke 2 cigerettes a day. States she uses nicotine patches.   Outpatient Encounter Medications as of 12/13/2022  Medication Sig   albuterol (VENTOLIN HFA) 108 (90 Base) MCG/ACT inhaler Inhale 2  puffs into the lungs every 4 (four) hours as needed for wheezing or shortness of breath.   amLODipine (NORVASC) 10 MG tablet TAKE 1 TABLET(10 MG) BY MOUTH DAILY   amoxicillin (AMOXIL) 875 MG tablet Take 875 mg by mouth 2 (two) times daily. For dental procedure   atorvastatin (LIPITOR) 40 MG tablet TAKE 1 TABLET(40 MG) BY MOUTH DAILY   carvedilol (COREG) 25 MG tablet Take 1 tablet (25 mg total) by mouth 2 (two) times daily.   Continuous Blood Gluc Receiver (FREESTYLE LIBRE 2 READER) DEVI Use as directed daily.   Continuous Blood Gluc Sensor (FREESTYLE LIBRE 2 SENSOR) MISC 1 application by Does not apply route every 14 (fourteen) days.   DULoxetine (CYMBALTA) 60 MG capsule TAKE 1 CAPSULE(60 MG) BY MOUTH DAILY   ENTRESTO 97-103 MG TAKE 1 TABLET BY MOUTH TWICE DAILY   fluticasone (FLONASE) 50 MCG/ACT nasal spray Place 1 spray into both nostrils daily.   furosemide (LASIX) 40 MG tablet TAKE 1 TABLET(40 MG) BY MOUTH TWICE DAILY   gabapentin (NEURONTIN) 300 MG capsule TAKE 1 CAPSULE(300 MG) BY MOUTH THREE TIMES DAILY   metFORMIN (GLUCOPHAGE) 1000 MG tablet Take 1 tablet (1,000 mg total) by mouth 2 (two) times daily with a meal.   methocarbamol (ROBAXIN) 750 MG tablet Take 1 tablet (750 mg total) by mouth 3 (three) times daily as needed for muscle spasms.   spironolactone (ALDACTONE) 25 MG tablet TAKE 1 TABLET(25 MG) BY MOUTH DAILY   traMADol (ULTRAM) 50 MG tablet Take 1 tablet (50 mg total) by mouth every 6 (six) hours as needed for moderate pain or severe pain.   [DISCONTINUED] Dulaglutide (TRULICITY) A999333 [id number] SOPN Inject 0.75 mg into the skin once a week.   [DISCONTINUED] insulin lispro (HUMALOG) 100 UNIT/ML injection Inject 0.12 mLs (12 Units total) into the skin 3 (three) times daily before meals.   [DISCONTINUED] linaclotide (LINZESS) 145 MCG CAPS capsule Take 1 capsule (145 mcg total) by mouth daily before breakfast.   acetaminophen (TYLENOL) 325 MG tablet Take 2 tablets (650 mg total) by  mouth every 6 (six) hours as needed. (Patient not taking: Reported on 12/13/2022)   aspirin EC 81 MG tablet Take 1 tablet (81 mg total) by mouth daily. Swallow whole. (Patient not taking: Reported on 11/27/2022)   cetirizine (ZYRTEC) 10 MG tablet Take 1 tablet (10 mg total) by mouth in the morning. (Patient not taking: Reported on 12/13/2022)   Dulaglutide (TRULICITY) A999333 [id number] SOPN Inject 0.75 mg into the skin once a week.   insulin glargine (LANTUS) 100 UNIT/ML injection Inject 0.2 mLs (20 Units total) into the skin at bedtime.   insulin lispro (HUMALOG) 100 UNIT/ML injection Inject 0.12 mLs (12 Units total) into the skin 2 (two) times daily with a meal.   ipratropium-albuterol (DUONEB) 0.5-2.5 (3) MG/3ML SOLN Take 3 mLs by nebulization every 6 (six) hours as needed (shortness of breath, wheeze).   linaclotide (LINZESS) 145 MCG CAPS capsule Take 1 capsule (145 mcg total) by mouth daily before breakfast.   ondansetron (ZOFRAN) 4 MG tablet Take 1 tablet (4 mg total) by mouth every 8 (eight) hours as needed for nausea or vomiting. (Patient not taking: Reported on 12/13/2022)   [DISCONTINUED] insulin aspart (  NOVOLOG) 100 UNIT/ML injection Inject 12 Units into the skin 2 (two) times daily with a meal. (Patient not taking: Reported on 12/13/2022)   [DISCONTINUED] insulin glargine (LANTUS) 100 UNIT/ML injection Inject 0.24 mLs (24 Units total) into the skin at bedtime. (Patient not taking: Reported on 11/27/2022)   Facility-Administered Encounter Medications as of 12/13/2022  Medication   0.9 %  sodium chloride infusion     Review of Systems  Review of Systems  Constitutional: Negative.   HENT: Negative.    Cardiovascular: Negative.   Gastrointestinal: Negative.   Allergic/Immunologic: Negative.   Neurological: Negative.   Psychiatric/Behavioral: Negative.         Physical Exam  BP (!) 159/73   Pulse 95   Temp (!) 97.3 F (36.3 C)   Wt 298 lb (135.2 kg)   SpO2 97%   BMI 46.67 kg/m    Wt Readings from Last 5 Encounters:  12/13/22 298 lb (135.2 kg)  11/27/22 295 lb 9.6 oz (134.1 kg)  09/06/22 296 lb (134.3 kg)  08/09/22 296 lb 12.8 oz (134.6 kg)  06/22/22 292 lb (132.5 kg)     Physical Exam Vitals and nursing note reviewed.  Constitutional:      General: She is not in acute distress.    Appearance: She is well-developed.  Cardiovascular:     Rate and Rhythm: Normal rate and regular rhythm.  Pulmonary:     Effort: Pulmonary effort is normal.     Breath sounds: Normal breath sounds.  Neurological:     Mental Status: She is alert and oriented to person, place, and time.      Lab Results:  CBC    Component Value Date/Time   WBC 4.2 11/27/2022 0914   WBC 3.5 (L) 10/06/2021 0359   RBC 5.20 11/27/2022 0914   RBC 4.01 10/06/2021 0359   HGB 15.3 11/27/2022 0914   HCT 47.5 (H) 11/27/2022 0914   PLT 225 11/27/2022 0914   MCV 91 11/27/2022 0914   MCH 29.4 11/27/2022 0914   MCH 28.9 10/06/2021 0359   MCHC 32.2 11/27/2022 0914   MCHC 30.9 10/06/2021 0359   RDW 13.0 11/27/2022 0914   LYMPHSABS 0.9 10/06/2021 0359   LYMPHSABS 1.0 10/04/2020 1019   MONOABS 0.6 10/06/2021 0359   EOSABS 0.2 10/06/2021 0359   EOSABS 0.1 10/04/2020 1019   BASOSABS 0.0 10/06/2021 0359   BASOSABS 0.0 10/04/2020 1019    BMET    Component Value Date/Time   NA 139 11/27/2022 0914   K 4.7 11/27/2022 0914   CL 101 11/27/2022 0914   CO2 25 11/27/2022 0914   GLUCOSE 125 (H) 11/27/2022 0914   GLUCOSE 129 (H) 10/06/2021 0359   BUN 12 11/27/2022 0914   CREATININE 0.76 11/27/2022 0914   CALCIUM 9.8 11/27/2022 0914   GFRNONAA >60 10/06/2021 0359   GFRAA 111 10/04/2020 1019    BNP    Component Value Date/Time   BNP 119.1 (H) 08/02/2019 0610    ProBNP    Component Value Date/Time   PROBNP 11 02/02/2021 0923   PROBNP 33.0 03/01/2020 1432    Imaging: No results found.   Assessment & Plan:   Hemoglobin A1C greater than 9%, indicating poor diabetic control -  Microalbumin/Creatinine Ratio, Urine - Ambulatory referral to Ophthalmology - Ambulatory referral to Podiatry - Ambulatory referral to Endocrinology  - insulin glargine (LANTUS) 100 UNIT/ML injection; Inject 0.2 mLs (20 Units total) into the skin at bedtime.  Dispense: 15 mL; Refill: 0- will decrease to  15 units daily  - insulin lispro (HUMALOG) 100 UNIT/ML injection; Inject 0.12 mLs (12 Units total) into the skin 2 (two) times daily with a meal.  Dispense: 9 mL; Refill: 3  High protein / low carb snacks and meals   2. Encounter for screening mammogram for malignant neoplasm of breast  - MM Digital Screening; Future   3. Hyperglycemia  - insulin glargine (LANTUS) 100 UNIT/ML injection; Inject 0.2 mLs (20 Units total) into the skin at bedtime.  Dispense: 15 mL; Refill: 0 - insulin lispro (HUMALOG) 100 UNIT/ML injection; Inject 0.12 mLs (12 Units total) into the skin 2 (two) times daily with a meal.  Dispense: 9 mL; Refill: 3    4. Type 2 diabetes mellitus with hyperglycemia, with long-term current use of insulin (HCC)  - insulin glargine (LANTUS) 100 UNIT/ML injection; Inject 0.2 mLs (20 Units total) into the skin at bedtime.  Dispense: 15 mL; Refill: 0 - insulin lispro (HUMALOG) 100 UNIT/ML injection; Inject 0.12 mLs (12 Units total) into the skin 2 (two) times daily with a meal.  Dispense: 9 mL; Refill: 3   Follow up in 3 months     Fenton Foy, NP 12/13/2022

## 2022-12-13 NOTE — Assessment & Plan Note (Signed)
-   Microalbumin/Creatinine Ratio, Urine - Ambulatory referral to Ophthalmology - Ambulatory referral to Podiatry - Ambulatory referral to Endocrinology  - insulin glargine (LANTUS) 100 UNIT/ML injection; Inject 0.2 mLs (20 Units total) into the skin at bedtime.  Dispense: 15 mL; Refill: 0- will decrease to 15 units daily  - insulin lispro (HUMALOG) 100 UNIT/ML injection; Inject 0.12 mLs (12 Units total) into the skin 2 (two) times daily with a meal.  Dispense: 9 mL; Refill: 3  High protein / low carb snacks and meals   2. Encounter for screening mammogram for malignant neoplasm of breast  - MM Digital Screening; Future   3. Hyperglycemia  - insulin glargine (LANTUS) 100 UNIT/ML injection; Inject 0.2 mLs (20 Units total) into the skin at bedtime.  Dispense: 15 mL; Refill: 0 - insulin lispro (HUMALOG) 100 UNIT/ML injection; Inject 0.12 mLs (12 Units total) into the skin 2 (two) times daily with a meal.  Dispense: 9 mL; Refill: 3    4. Type 2 diabetes mellitus with hyperglycemia, with long-term current use of insulin (HCC)  - insulin glargine (LANTUS) 100 UNIT/ML injection; Inject 0.2 mLs (20 Units total) into the skin at bedtime.  Dispense: 15 mL; Refill: 0 - insulin lispro (HUMALOG) 100 UNIT/ML injection; Inject 0.12 mLs (12 Units total) into the skin 2 (two) times daily with a meal.  Dispense: 9 mL; Refill: 3   Follow up in 3 months

## 2022-12-13 NOTE — Patient Instructions (Addendum)
1. Hemoglobin A1C greater than 9%, indicating poor diabetic control  - Microalbumin/Creatinine Ratio, Urine - Ambulatory referral to Ophthalmology - Ambulatory referral to Podiatry - Ambulatory referral to Endocrinology  - insulin glargine (LANTUS) 100 UNIT/ML injection; Inject 0.2 mLs (20 Units total) into the skin at bedtime.  Dispense: 15 mL; Refill: 0- will decrease to 15 units daily  - insulin lispro (HUMALOG) 100 UNIT/ML injection; Inject 0.12 mLs (12 Units total) into the skin 2 (two) times daily with a meal.  Dispense: 9 mL; Refill: 3  High protein / low carb snacks and meals   2. Encounter for screening mammogram for malignant neoplasm of breast  - MM Digital Screening; Future   3. Hyperglycemia  - insulin glargine (LANTUS) 100 UNIT/ML injection; Inject 0.2 mLs (20 Units total) into the skin at bedtime.  Dispense: 15 mL; Refill: 0 - insulin lispro (HUMALOG) 100 UNIT/ML injection; Inject 0.12 mLs (12 Units total) into the skin 2 (two) times daily with a meal.  Dispense: 9 mL; Refill: 3    4. Type 2 diabetes mellitus with hyperglycemia, with long-term current use of insulin (HCC)  - insulin glargine (LANTUS) 100 UNIT/ML injection; Inject 0.2 mLs (20 Units total) into the skin at bedtime.  Dispense: 15 mL; Refill: 0 - insulin lispro (HUMALOG) 100 UNIT/ML injection; Inject 0.12 mLs (12 Units total) into the skin 2 (two) times daily with a meal.  Dispense: 9 mL; Refill: 3   Follow up in 3 months   Preventing Hypoglycemia Hypoglycemia occurs when the level of sugar (glucose) in the blood is too low. Hypoglycemia can happen in people who do or do not have diabetes (diabetes mellitus). It can develop quickly, and it can be a medical emergency. For most people with diabetes, a blood glucose level below 70 mg/dL (3.9 mmol/L) is considered hypoglycemia. Glucose is a type of sugar that provides the body's main source of energy. Certain hormones (insulin and glucagon) control the  level of glucose in the blood. Insulin lowers blood glucose, and glucagon increases blood glucose. Hypoglycemia can result from having too much insulin in the bloodstream, or from not eating enough food that contains glucose. Your risk for hypoglycemia is higher: If you take insulin or diabetes medicines to help lower your blood glucose or to help your body make more insulin. If you skip or delay a meal or snack. If you are ill. During and after exercise. You can prevent hypoglycemia by working with your health care provider to adjust your meal plan as needed and by taking other precautions. How can hypoglycemia affect me? Mild symptoms Mild hypoglycemia may not cause any symptoms. If you do have symptoms, they may include: Hunger. Sweating and feeling clammy. Dizziness or feeling light-headed. Sleepiness or restless sleep. Nausea. Increased heart rate. Headache. Blurry vision. Mood changes, including irritability or anxiety. Tingling or numbness around the mouth, lips, or tongue. If mild hypoglycemia is not recognized and treated, it can quickly become moderate or severe hypoglycemia. Moderate symptoms Moderate hypoglycemia can cause: Confusion and poor judgment. Behavior changes. Weakness. Irregular heartbeat. A change in coordination. Severe symptoms Severe hypoglycemia is a medical emergency. It can cause: Fainting. Seizures. Loss of consciousness (coma). Death. What nutrition changes can be made? Work with your health care provider or dietitian to make a healthy meal plan that is right for you. Follow your meal plan carefully. Eat meals at regular times. If recommended by your health care provider, have snacks between meals. Donot skip or delay meals or  snacks. You can be at risk for hypoglycemia if you are not getting enough carbohydrates. What lifestyle changes can be made?  Work closely with your health care provider to manage your blood glucose. Make sure you  know: Your goal blood glucose levels. How and when to check your blood glucose. The symptoms of hypoglycemia. It is important to treat hypoglycemia right away to keep it from becoming severe. Do not drink alcohol on an empty stomach. When you are ill, check your blood glucose more often than usual. Make a sick day plan in advance with your health care provider. Follow this plan whenever you cannot eat or drink normally. Always check your blood glucose before, during, and after exercise. How is this treated? This condition can often be treated by immediately eating or drinking something that contains sugar with 15 grams of fast-acting carbohydrate, such as: 4 oz (120 mL) of fruit juice. 4 oz (120 mL) of regular soda (not diet soda). Several pieces of hard candy. Check food labels to find out how many pieces to eat for 15 grams. 1 Tbsp (15 mL) of sugar or honey. 4 glucose tablets. 1 tube of glucose gel. Treating hypoglycemia if you have diabetes If you are alert and able to swallow safely, follow the 15:15 rule: Take 15 grams of a fast-acting carbohydrate. Talk with your health care provider about how much you should take. Fast-acting options include: Glucose tablets (take 4 tablets). Several pieces of hard candy. Check food labels to find out how many pieces to eat for 15 grams. 4 oz (120 mL) of fruit juice. 4 oz (120 mL) of regular soda (not diet soda). 1 Tbsp (15 mL) of sugar or honey. 1 tube of glucose gel. Check your blood glucose 15 minutes after you take the carbohydrate. If the repeat blood glucose level is still at or below 70 mg/dL (3.9 mmol/L), take 15 grams of a carbohydrate again. If your blood glucose level does not increase above 70 mg/dL (3.9 mmol/L) after 3 tries, seek emergency medical care. After your blood glucose level returns to normal, eat a meal or a snack within 1 hour. Treating severe hypoglycemia Severe hypoglycemia is when your blood glucose level is below 54  mg/dL (3 mmol/L). Severe hypoglycemia is a medical emergency. Get medical help right away. If you have severe hypoglycemia and you cannot eat or drink, you may need glucagon. A family member or close friend should learn how to check your blood glucose and how to give you glucagon. Ask your health care provider if you need to have an emergency glucagon kit available. Severe hypoglycemia may need to be treated in a hospital. The treatment may include getting glucose through an IV. You may also need treatment for the cause of your hypoglycemia. Where to find more information American Diabetes Association: www.diabetes.[employer] of Diabetes and Digestive and Kidney Diseases: [employer] Association of Diabetes Care & Education Specialists: www.diabeteseducator.org Contact a health care provider if: You have problems keeping your blood glucose in your target range. You have frequent episodes of hypoglycemia. Get help right away if: You continue to have hypoglycemia symptoms after eating or drinking something containing glucose. Your blood glucose level is below 54 mg/dL (3 mmol/L). You faint. You have a seizure. These symptoms may represent a serious problem that is an emergency. Do not wait to see if the symptoms will go away. Get medical help right away. Call your local emergency services (911 in the [place] Do not drive yourself to  the hospital. Summary Know the symptoms of hypoglycemia and when you are at risk for it, such as during exercise or when you are sick. Check your blood glucose often when you are at risk for hypoglycemia. Hypoglycemia can develop quickly, and it can be dangerous if it is not treated right away. If you have a history of severe hypoglycemia, make sure your family or a close friend knows how to use your glucagon kit. Make sure you know how to treat hypoglycemia. Keep a fast-acting carbohydrate option available when you may be at risk for hypoglycemia. This  information is not intended to replace advice given to you by your health care provider. Make sure you discuss any questions you have with your health care provider. Document Revised: 08/12/2020 Document Reviewed: 08/12/2020 Elsevier Patient Education  [place].

## 2022-12-14 LAB — MICROALBUMIN / CREATININE URINE RATIO
Creatinine, Urine: 160.1 mg/dL
Microalb/Creat Ratio: 282 mg/g creat — ABNORMAL HIGH (ref 0–29)
Microalbumin, Urine: 451.9 ug/mL

## 2022-12-21 ENCOUNTER — Inpatient Hospital Stay (HOSPITAL_BASED_OUTPATIENT_CLINIC_OR_DEPARTMENT_OTHER): Admission: RE | Admit: 2022-12-21 | Payer: Medicare Other | Source: Ambulatory Visit | Admitting: Radiology

## 2022-12-26 ENCOUNTER — Other Ambulatory Visit: Payer: Medicare Other | Admitting: Pharmacist

## 2022-12-26 DIAGNOSIS — R7309 Other abnormal glucose: Secondary | ICD-10-CM

## 2022-12-26 DIAGNOSIS — R739 Hyperglycemia, unspecified: Secondary | ICD-10-CM

## 2022-12-26 DIAGNOSIS — Z794 Long term (current) use of insulin: Secondary | ICD-10-CM

## 2022-12-26 MED ORDER — ENTRESTO 97-103 MG PO TABS: 1.0000 | ORAL_TABLET | Freq: Two times a day (BID) | ORAL | 1 refills | Status: DC

## 2022-12-26 MED ORDER — INSULIN LISPRO 100 UNIT/ML IJ SOLN: 10.0000 [IU] | Freq: Two times a day (BID) | INTRAMUSCULAR | 3 refills | Status: DC

## 2022-12-26 MED ORDER — INSULIN GLARGINE 100 UNIT/ML ~~LOC~~ SOLN: 10.0000 [IU] | Freq: Every day | SUBCUTANEOUS | 0 refills | Status: DC

## 2022-12-26 MED ORDER — METFORMIN HCL 1000 MG PO TABS: 1000.0000 mg | ORAL_TABLET | Freq: Two times a day (BID) | ORAL | 1 refills | Status: DC

## 2022-12-26 MED ORDER — TRULICITY 1.5 MG/0.5ML ~~LOC~~ SOAJ: 1.5000 mg | SUBCUTANEOUS | 2 refills | Status: DC

## 2022-12-26 MED ORDER — INSULIN GLARGINE 100 UNIT/ML ~~LOC~~ SOLN: 16.0000 [IU] | Freq: Every day | SUBCUTANEOUS | 0 refills | Status: DC

## 2022-12-26 NOTE — Progress Notes (Signed)
12/26/2022 Name: Sharon Swanson MRN: [id number] DOB: Jan 24, 1961  Chief Complaint  Patient presents with   Medication Management   Diabetes    Sharon Swanson is a 62 y.o. year old female who presented for a telephone visit.   They were referred to the pharmacist by their PCP for assistance in managing diabetes.    Subjective:  Care Team: Primary Care Provider: Fenton Foy, NP ; Next Scheduled Visit: 02/26/23  Medication Access/Adherence  Current Pharmacy:  [facility] [place], [place] [address] [place] [address] Phone: [phone] Fax: [phone]   Patient reports affordability concerns with their medications: No  Patient reports access/transportation concerns to their pharmacy: No  Patient reports adherence concerns with their medications:  No    Diabetes:  Current medications: metformin 1000 mg twice daily, Lantus 20 units daily, Humalog 20 units twice daily, Trulicity [id number] mg weekly  Date of Download: past 7 days Average Glucose: 132 mg/dL 130/136/151/154 Time in Goal:  - Time in range 70-180: 81% - Time above range: 19% - Time below range: 0%  Patient reports hypoglycemic s/sx including dizziness, shakiness, sweating. Reports she does still have some episodes of hypoglycemia.   Hyperlipidemia/ASCVD Risk Reduction  Current lipid lowering medications: atorvastatin 40 mg daily  Antiplatelet regimen: aspirin 81 mg daily  Heart Failure:  Current medications:  ACEi/ARB/ARNI: Entresto 97/103 mg twice daily SGLT2i: none, consider moving forward Beta blocker: carvedilol 25 mg twice daily Mineralocorticoid Receptor Antagonist: spironolactone 25 mg daily Diuretic regimen: furosemide 40 mg twice daily  Objective:  Lab Results  Component Value Date   HGBA1C 9.3 (A) 12/13/2022    Lab Results  Component Value Date   CREATININE 0.76 11/27/2022   BUN 12 11/27/2022   NA 139 11/27/2022   K 4.7  11/27/2022   CL 101 11/27/2022   CO2 25 11/27/2022    Lab Results  Component Value Date   CHOL 256 (H) 11/27/2022   HDL 79 11/27/2022   LDLCALC 159 (H) 11/27/2022   TRIG 107 11/27/2022   CHOLHDL 3.2 11/27/2022    Medications Reviewed Today     Reviewed by Sharon Foy, NP (Nurse Practitioner) on 12/13/22 at 52  Med List Status: <None>   Medication Order Taking? Sig Documenting Provider Last Dose Status Informant  0.9 %  sodium chloride infusion [id number]   Swanson, Sharon Lines, MD  Active   acetaminophen (TYLENOL) 325 MG tablet [id number] No Take 2 tablets (650 mg total) by mouth every 6 (six) hours as needed.  Patient not taking: Reported on 12/13/2022   Courtney Heys, MD Not Taking Active            Med Note [facility], [place]   Wed Dec 13, 2022 10:56 AM) Needs refill   albuterol (VENTOLIN HFA) 108 (90 Base) MCG/ACT inhaler [id number] Yes Inhale 2 puffs into the lungs every 4 (four) hours as needed for wheezing or shortness of breath. Sharon Rival, FNP Taking Active   amLODipine (NORVASC) 10 MG tablet [id number] Yes TAKE 1 TABLET(10 MG) BY MOUTH DAILY Sharon Foy, NP Taking Active   amoxicillin (AMOXIL) 875 MG tablet [id number] Yes Take 875 mg by mouth 2 (two) times daily. For dental procedure [provider] Taking Active   aspirin EC 81 MG tablet [id number]  Take 1 tablet (81 mg total) by mouth daily. Swallow whole.  Patient not taking: Reported on 11/27/2022   Sharon Lund., NP  Active  atorvastatin (LIPITOR) 40 MG tablet [id number] Yes TAKE 1 TABLET(40 MG) BY MOUTH DAILY Sharon Foy, NP Taking Active   carvedilol (COREG) 25 MG tablet [id number] Yes Take 1 tablet (25 mg total) by mouth 2 (two) times daily. Sharon Rival, FNP Taking Active   cetirizine (ZYRTEC) 10 MG tablet [id number] No Take 1 tablet (10 mg total) by mouth in the morning.  Patient not taking: Reported on 12/13/2022   Vevelyn Francois, NP Not Taking Active Self  Continuous  Blood Gluc Receiver (FREESTYLE LIBRE 2 READER) DEVI [id number] Yes Use as directed daily. Sharon Rival, FNP Taking Active   Continuous Blood Gluc Sensor (FREESTYLE LIBRE 2 SENSOR) MISC [id number] Yes 1 application by Does not apply route every 14 (fourteen) days. Vevelyn Francois, NP Taking Active   Dulaglutide (TRULICITY) [id number] 0000000 SOPN [id number]  Inject 0.75 mg into the skin once a week. Sharon Foy, NP  Active   DULoxetine (CYMBALTA) 60 MG capsule [id number] Yes TAKE 1 CAPSULE(60 MG) BY MOUTH DAILY Sharon Foy, NP Taking Active   ENTRESTO 97-103 MG [id number] Yes TAKE 1 TABLET BY MOUTH TWICE DAILY Sharon Foy, NP Taking Active   fluticasone (FLONASE) 50 MCG/ACT nasal spray [id number] Yes Place 1 spray into both nostrils daily. Sharon Rival, FNP Taking Active   furosemide (LASIX) 40 MG tablet [id number] Yes TAKE 1 TABLET(40 MG) BY MOUTH TWICE DAILY Sharon Foy, NP Taking Active   gabapentin (NEURONTIN) 300 MG capsule [id number] Yes TAKE 1 CAPSULE(300 MG) BY MOUTH THREE TIMES DAILY Sharon Foy, NP Taking Active   insulin glargine (LANTUS) 100 UNIT/ML injection [id number]  Inject 0.2 mLs (20 Units total) into the skin at bedtime. Sharon Foy, NP  Active   insulin lispro (HUMALOG) 100 UNIT/ML injection [id number]  Inject 0.12 mLs (12 Units total) into the skin 2 (two) times daily with a meal. Sharon Foy, NP  Active   ipratropium-albuterol (DUONEB) 0.5-2.5 (3) MG/3ML SOLN [id number]  Take 3 mLs by nebulization every 6 (six) hours as needed (shortness of breath, wheeze). Azzie Glatter, FNP  Expired 11/27/22 2359 Self  linaclotide (LINZESS) 145 MCG CAPS capsule [id number]  Take 1 capsule (145 mcg total) by mouth daily before breakfast. Sharon Foy, NP  Active   metFORMIN (GLUCOPHAGE) 1000 MG tablet [id number] Yes Take 1 tablet (1,000 mg total) by mouth 2 (two) times daily with a meal. [place], Sharon Conger, FNP Taking Active   methocarbamol (ROBAXIN) 750 MG  tablet [id number] Yes Take 1 tablet (750 mg total) by mouth 3 (three) times daily as needed for muscle spasms. Azzie Glatter, FNP Taking Active Self           Med Note Jodi Mourning, Kaytlen Lightsey T   Tue Nov 07, 2022  8:54 AM) Twice daily  ondansetron (ZOFRAN) 4 MG tablet [id number] No Take 1 tablet (4 mg total) by mouth every 8 (eight) hours as needed for nausea or vomiting.  Patient not taking: Reported on 12/13/2022   Courtney Heys, MD Not Taking Active Self  spironolactone (ALDACTONE) 25 MG tablet [id number] Yes TAKE 1 TABLET(25 MG) BY MOUTH DAILY Sharon Foy, NP Taking Active   traMADol (ULTRAM) 50 MG tablet [id number] Yes Take 1 tablet (50 mg total) by mouth every 6 (six) hours as needed for moderate pain or severe pain. Courtney Heys, MD Taking Active            Med Note Gaylyn Cheers Nov 27, 2022  8:38 AM) Refill               Assessment/Plan:   Diabetes: - Currently uncontrolled - Reviewed long term cardiovascular and renal outcomes of uncontrolled blood sugar - Reviewed goal A1c, goal fasting, and goal 2 hour post prandial glucose - Recommend to complete current supply of Trulicity, then increase to 1.5 mg weekly. Recommend to reduce Lantus to 16 units daily, reduce Humalog to 10 units twice daily to reduce risk of hypoglycemia. Patient amenable to this change. Moving forward, will consider SGLT2 for HF benefit - Recommend to check glucose continuously using CGM  Heart Failure: - Currently appropriately managed - Recommend to continue current regimen. Consider SGLT2 as above.    Hyperlipidemia/ASCVD Risk Reduction: - Currently uncontrolled.  - Recommend to continue current regimen. Fill history indicates adherence; will discuss moving forward  Follow Up Plan: phone call in 4 weeks  Catie Hedwig Morton, PharmD, [place] [phone]

## 2022-12-28 ENCOUNTER — Ambulatory Visit (INDEPENDENT_AMBULATORY_CARE_PROVIDER_SITE_OTHER): Payer: Medicare Other | Admitting: Podiatry

## 2022-12-28 DIAGNOSIS — Z91199 Patient's noncompliance with other medical treatment and regimen due to unspecified reason: Secondary | ICD-10-CM

## 2022-12-28 NOTE — Progress Notes (Signed)
Pt was a no show for apt, charge generated 

## 2023-01-01 ENCOUNTER — Telehealth: Payer: Self-pay

## 2023-01-01 NOTE — Telephone Encounter (Signed)
Faxed today. Gh

## 2023-01-04 ENCOUNTER — Ambulatory Visit (HOSPITAL_BASED_OUTPATIENT_CLINIC_OR_DEPARTMENT_OTHER): Payer: Medicare Other | Admitting: Radiology

## 2023-01-04 ENCOUNTER — Other Ambulatory Visit: Payer: Self-pay | Admitting: Nurse Practitioner

## 2023-01-04 NOTE — Telephone Encounter (Signed)
Please advise KH 

## 2023-01-08 ENCOUNTER — Telehealth: Payer: Self-pay

## 2023-01-08 NOTE — Telephone Encounter (Signed)
Called pt and she is schedule for 01/18/23 at 8am. Gh

## 2023-01-10 ENCOUNTER — Other Ambulatory Visit: Payer: Self-pay | Admitting: Nurse Practitioner

## 2023-01-17 ENCOUNTER — Other Ambulatory Visit: Payer: Self-pay | Admitting: Nurse Practitioner

## 2023-01-18 ENCOUNTER — Ambulatory Visit (HOSPITAL_COMMUNITY)
Admission: RE | Admit: 2023-01-18 | Discharge: 2023-01-18 | Disposition: A | Discharge status: Home / Self Care | Payer: Medicare Other | Source: Ambulatory Visit | Attending: Nurse Practitioner | Admitting: Nurse Practitioner

## 2023-01-18 ENCOUNTER — Encounter: Payer: Self-pay | Admitting: Nurse Practitioner

## 2023-01-18 ENCOUNTER — Ambulatory Visit (INDEPENDENT_AMBULATORY_CARE_PROVIDER_SITE_OTHER): Payer: Medicare Other | Admitting: Nurse Practitioner

## 2023-01-18 VITALS — BP 165/86 | HR 87 | Temp 97.4°F | Ht 67.0 in | Wt 296.8 lb

## 2023-01-18 DIAGNOSIS — M542 Cervicalgia: Secondary | ICD-10-CM | POA: Diagnosis not present

## 2023-01-18 MED ORDER — KETOROLAC TROMETHAMINE 30 MG/ML IJ SOLN
30.0000 mg | Freq: Once | INTRAMUSCULAR | Status: AC
Start: 2023-01-18 — End: 2023-01-18
  Administered 2023-01-18: 30 mg via INTRAMUSCULAR

## 2023-01-18 MED ORDER — PREDNISONE 20 MG PO TABS
20.0000 mg | ORAL_TABLET | Freq: Every day | ORAL | 0 refills | Status: AC
Start: 2023-01-18 — End: 2023-01-23

## 2023-01-18 MED ORDER — TIZANIDINE HCL 4 MG PO TABS
4.0000 mg | ORAL_TABLET | Freq: Four times a day (QID) | ORAL | 0 refills | Status: AC | PRN
Start: 2023-01-18 — End: ?

## 2023-01-18 NOTE — Progress Notes (Signed)
@Patient  ID: Sharon Swanson, female    DOB: 1961/03/10, 62 y.o.   MRN: [id number]  Chief Complaint  Patient presents with   Neck Pain    For two weeks     Referring provider: Ivonne Andrew, NP   HPI   Patient presents today for an acute visit for neck pain.  Patient states this pain started 2 weeks ago.  She denies any injury.  She states that the pain is worse on her right side radiating down into her shoulder.  She does have decreased range of motion to her neck due to pain.  Patient has tried over-the-counter Aleve with minimal relief noted.  We will order x-ray and give Toradol injection in office today.  We will order muscle relaxers and prednisone to start tomorrow.  Discussed with patient's that this could raise her blood sugars and she does need to keep a close watch on this. Denies f/c/s, n/v/d, hemoptysis, PND, leg swelling Denies chest pain or edema      Allergies  Allergen Reactions   Aspirin Other (See Comments)    Abdominal pain Abdominal pain Abdominal pain Abdominal pain Abdominal pain Abdominal pain Abdominal pain    Immunization History  Administered Date(s) Administered   DTaP 05/28/2020   Influenza,inj,Quad PF,6+ Mos 08/18/2014, 06/20/2016, 05/28/2020   Influenza-Unspecified 08/18/2014, 06/19/2016   Moderna Sars-Covid-2 Vaccination 12/12/2019, 01/13/2020   Pneumococcal Polysaccharide-23 08/18/2014, 04/25/2016, 06/28/2020   Pneumococcal-Unspecified 05/29/2016    Past Medical History:  Diagnosis Date   Abnormal Pap smear of cervix 10/2020   Acid reflux    Allergy    Asthma    Atypical squamous cells of undetermined significance (ASC-US) on cervical Pap smear 10/2020   Bilateral lower extremity edema 01/2020   COPD (chronic obstructive pulmonary disease)    CPAP (continuous positive airway pressure) dependence    Diabetes    Diabetes mellitus without complication    Type II   Fatty liver 11/08/2011   Fibroids 11/08/2011   Hyperglycemia     Hyperlipidemia 05/2020   Hypertension    Microalbuminuria 01/2020   Obesity (BMI 30-39.9) 11/08/2011   Osteoarthritis of right acromioclavicular joint    Pancreatitis 11/08/2011   Rotator cuff tear, right    Shortness of breath on exertion 01/2020   Sleep apnea    Ventral hernia    Vitamin D deficiency 05/2020    Tobacco History: Social History   Tobacco Use  Smoking Status Every Day   Packs/day: 0.10   Years: 39.00   Additional pack years: 0.00   Total pack years: 3.90   Types: Cigarettes   Start date: 09/25/1980  Smokeless Tobacco Never  Tobacco Comments   pt states she smoke 2 cigerettes a day. States she uses nicotine patches.   Ready to quit: Not Answered Counseling given: Not Answered Tobacco comments: pt states she smoke 2 cigerettes a day. States she uses nicotine patches.   Outpatient Encounter Medications as of 01/18/2023  Medication Sig   albuterol (VENTOLIN HFA) 108 (90 Base) MCG/ACT inhaler Inhale 2 puffs into the lungs every 4 (four) hours as needed for wheezing or shortness of breath.   amLODipine (NORVASC) 10 MG tablet TAKE 1 TABLET(10 MG) BY MOUTH DAILY   amoxicillin (AMOXIL) 875 MG tablet Take 875 mg by mouth 2 (two) times daily. For dental procedure   aspirin EC 81 MG tablet Take 1 tablet (81 mg total) by mouth daily. Swallow whole.   atorvastatin (LIPITOR) 40 MG tablet TAKE 1 TABLET(40 MG) BY  MOUTH DAILY   carvedilol (COREG) 25 MG tablet Take 1 tablet (25 mg total) by mouth 2 (two) times daily.   Continuous Blood Gluc Receiver (FREESTYLE LIBRE 2 READER) DEVI Use as directed daily.   Continuous Blood Gluc Sensor (FREESTYLE LIBRE 2 SENSOR) MISC 1 application by Does not apply route every 14 (fourteen) days.   Dulaglutide (TRULICITY) 1.5 MG/0.5ML SOPN Inject 1.5 mg into the skin once a week.   DULoxetine (CYMBALTA) 60 MG capsule TAKE 1 CAPSULE(60 MG) BY MOUTH DAILY   fluticasone (FLONASE) 50 MCG/ACT nasal spray SHAKE LIQUID AND USE 1 SPRAY IN EACH NOSTRIL  DAILY   furosemide (LASIX) 40 MG tablet TAKE 1 TABLET(40 MG) BY MOUTH TWICE DAILY   gabapentin (NEURONTIN) 300 MG capsule TAKE 1 CAPSULE(300 MG) BY MOUTH THREE TIMES DAILY   insulin glargine (LANTUS) 100 UNIT/ML injection Inject 0.16 mLs (16 Units total) into the skin at bedtime.   insulin lispro (HUMALOG) 100 UNIT/ML injection Inject 0.1 mLs (10 Units total) into the skin 2 (two) times daily with a meal.   ipratropium-albuterol (DUONEB) 0.5-2.5 (3) MG/3ML SOLN Take 3 mLs by nebulization every 6 (six) hours as needed (shortness of breath, wheeze).   linaclotide (LINZESS) 145 MCG CAPS capsule Take 1 capsule (145 mcg total) by mouth daily before breakfast.   metFORMIN (GLUCOPHAGE) 1000 MG tablet Take 1 tablet (1,000 mg total) by mouth 2 (two) times daily with a meal.   methocarbamol (ROBAXIN) 750 MG tablet Take 1 tablet (750 mg total) by mouth 3 (three) times daily as needed for muscle spasms.   predniSONE (DELTASONE) 20 MG tablet Take 1 tablet (20 mg total) by mouth daily with breakfast for 5 days.   sacubitril-valsartan (ENTRESTO) 97-103 MG Take 1 tablet by mouth 2 (two) times daily.   spironolactone (ALDACTONE) 25 MG tablet TAKE 1 TABLET(25 MG) BY MOUTH DAILY   tiZANidine (ZANAFLEX) 4 MG tablet Take 1 tablet (4 mg total) by mouth every 6 (six) hours as needed for muscle spasms.   traMADol (ULTRAM) 50 MG tablet Take 1 tablet (50 mg total) by mouth every 6 (six) hours as needed for moderate pain or severe pain.   acetaminophen (TYLENOL) 325 MG tablet Take 2 tablets (650 mg total) by mouth every 6 (six) hours as needed. (Patient not taking: Reported on 12/13/2022)   cetirizine (ZYRTEC) 10 MG tablet Take 1 tablet (10 mg total) by mouth in the morning. (Patient not taking: Reported on 12/13/2022)   ondansetron (ZOFRAN) 4 MG tablet Take 1 tablet (4 mg total) by mouth every 8 (eight) hours as needed for nausea or vomiting. (Patient not taking: Reported on 12/13/2022)   Facility-Administered Encounter  Medications as of 01/18/2023  Medication   0.9 %  sodium chloride infusion   ketorolac (TORADOL) 30 MG/ML injection 30 mg     Review of Systems  Review of Systems  Constitutional: Negative.   HENT: Negative.    Cardiovascular: Negative.   Gastrointestinal: Negative.   Musculoskeletal:  Positive for neck pain.  Allergic/Immunologic: Negative.   Neurological: Negative.   Psychiatric/Behavioral: Negative.         Physical Exam  BP (!) 165/86   Pulse 87   Temp (!) 97.4 F (36.3 C)   Ht  (1.702 m)   Wt 296 lb 12.8 oz (134.6 kg)   SpO2 98%   BMI 46.49 kg/m   Wt Readings from Last 5 Encounters:  01/18/23 296 lb 12.8 oz (134.6 kg)  12/13/22 298 lb (135.2 kg)  11/27/22 295 lb 9.6 oz (134.1 kg)  09/06/22 296 lb (134.3 kg)  08/09/22 296 lb 12.8 oz (134.6 kg)     Physical Exam Vitals and nursing note reviewed.  Constitutional:      General: She is not in acute distress.    Appearance: She is well-developed.  Cardiovascular:     Rate and Rhythm: Normal rate and regular rhythm.  Pulmonary:     Effort: Pulmonary effort is normal.     Breath sounds: Normal breath sounds.  Musculoskeletal:     Cervical back: Decreased range of motion.  Neurological:     Mental Status: She is alert and oriented to person, place, and time.      Lab Results:  CBC    Component Value Date/Time   WBC 4.2 11/27/2022 0914   WBC 3.5 (L) 10/06/2021 0359   RBC 5.20 11/27/2022 0914   RBC 4.01 10/06/2021 0359   HGB 15.3 11/27/2022 0914   HCT 47.5 (H) 11/27/2022 0914   PLT 225 11/27/2022 0914   MCV 91 11/27/2022 0914   MCH 29.4 11/27/2022 0914   MCH 28.9 10/06/2021 0359   MCHC 32.2 11/27/2022 0914   MCHC 30.9 10/06/2021 0359   RDW 13.0 11/27/2022 0914   LYMPHSABS 0.9 10/06/2021 0359   LYMPHSABS 1.0 10/04/2020 1019   MONOABS 0.6 10/06/2021 0359   EOSABS 0.2 10/06/2021 0359   EOSABS 0.1 10/04/2020 1019   BASOSABS 0.0 10/06/2021 0359   BASOSABS 0.0 10/04/2020 1019    BMET     Component Value Date/Time   NA 139 11/27/2022 0914   K 4.7 11/27/2022 0914   CL 101 11/27/2022 0914   CO2 25 11/27/2022 0914   GLUCOSE 125 (H) 11/27/2022 0914   GLUCOSE 129 (H) 10/06/2021 0359   BUN 12 11/27/2022 0914   CREATININE 0.76 11/27/2022 0914   CALCIUM 9.8 11/27/2022 0914   GFRNONAA >60 10/06/2021 0359   GFRAA 111 10/04/2020 1019    BNP    Component Value Date/Time   BNP 119.1 (H) 08/02/2019 0610    ProBNP    Component Value Date/Time   PROBNP 11 02/02/2021 0923   PROBNP 33.0 03/01/2020 1432    Imaging: No results found.   Assessment & Plan:   Neck pain on right side - DG Cervical Spine Complete - tiZANidine (ZANAFLEX) 4 MG tablet; Take 1 tablet (4 mg total) by mouth every 6 (six) hours as needed for muscle spasms.  Dispense: 30 tablet; Refill: 0 - predniSONE (DELTASONE) 20 MG tablet; Take 1 tablet (20 mg total) by mouth daily with breakfast for 5 days.  Dispense: 5 tablet; Refill: 0 - ketorolac (TORADOL) 30 MG/ML injection 30 mg   Follow up:  Follow up as scheduled     Ivonne Andrew, NP 01/18/2023

## 2023-01-18 NOTE — Patient Instructions (Signed)
1. Neck pain on right side  - DG Cervical Spine Complete - tiZANidine (ZANAFLEX) 4 MG tablet; Take 1 tablet (4 mg total) by mouth every 6 (six) hours as needed for muscle spasms.  Dispense: 30 tablet; Refill: 0 - predniSONE (DELTASONE) 20 MG tablet; Take 1 tablet (20 mg total) by mouth daily with breakfast for 5 days.  Dispense: 5 tablet; Refill: 0 - ketorolac (TORADOL) 30 MG/ML injection 30 mg   Follow up:  Follow up as scheduled   Cervical Sprain A cervical sprain is also called a neck sprain. It is a stretch or tear in one or more ligaments in the neck. Ligaments are tissues that connect bones to each other. Neck sprains can be mild, bad, or very bad. A very bad sprain can cause problems with bones and other structures.  Most neck sprains heal in 4-6 weeks, but this depends on how bad the injury is. What are the causes? Neck sprains may be caused by trauma, such as: An injury from a motor vehicle accident. A fall. The head and neck being moved front to back or side to side all of a sudden (whiplash injury). Mild neck sprains may be caused by wear and tear over time. What increases the risk? Some activities put you at risk of hurting your neck. These include: Contact sports. Gymnastics. Diving. Arthritis caused by wear and tear of the joints in the spine. The neck not being very strong or flexible. Having had a neck injury in the past. Poor posture. Spending a lot of time in positions that put stress on the neck. This may be from sitting at a computer for a long time. What are the signs or symptoms? Signs or symptoms include any of these problems in your neck, shoulders, or upper back: Pain or tenderness. Stiffness. Swelling. A burning feeling. Sudden tightening of neck muscles (spasms). Not being able to move the neck very much. Headache. Feeling dizzy. Feeling like you may vomit, or vomiting. Having a hand or arm that: Feels weak. Loses feeling (feels  numb). Tingles. How is this treated? This condition is treated by: Resting and putting ice or heat on your neck. Doing exercises to improve movement and strength in the injured area (physical therapy). In some cases, treatment may also include: Keeping your neck in place for a length of time. This may be done using: A neck collar. This supports your chin and the back of your head. A cervical traction device. This is a sling that holds up your head. It removes weight and pressure from your neck. Medicines for pain or other symptoms. Surgery. This is rare. Follow these instructions at home: Medicines Take over-the-counter and prescription medicines only as told by your doctor. Ask your doctor if you should avoid driving or using machines while you are taking your medicine. If told, take steps to prevent problems with pooping (constipation). You may need to: Drink enough fluid to keep your pee (urine) pale yellow. Take medicines. You will be told what medicines to take. Eat foods that are high in fiber. These include beans, whole grains, and fresh fruits and vegetables. Limit foods that are high in fat and sugar. These include fried or sweet foods. If you have a neck collar: Wear it as told by your doctor. Do not take it off unless told. Ask your doctor before adjusting your collar. If you have long hair, keep it outside of the collar. If you are allowed to take off the collar for cleaning  and bathing: Follow instructions about how to take it off safely. Clean it by hand with mild soap and water. Let it air-dry fully. If your collar has pads that you can take out: Take the pads out every 1-2 days. Wash them by hand with soap and water. Let the pads air-dry fully before you put them back in the collar. Tell your doctor if your skin under the collar has irritation or sores. Managing pain, stiffness, and swelling     Use a cervical traction device, if told by your doctor. If told, put  ice on the affected area. Put ice in a plastic bag. Place a towel between your skin and the bag. Leave the ice on for 20 minutes, 2-3 times a day. If told, put heat on the affected area. Do this before exercise or as often as told by your doctor. Use the heat source that your doctor recommends, such as a moist heat pack or a heating pad. Place a towel between your skin and the heat source. Leave the heat on for 20-30 minutes. If your skin turns bright red, take off the ice or heat right away to prevent skin damage. The risk of damage is higher if you cannot feel pain, heat, or cold. Activity Do not drive while wearing a neck collar. If you do not have a neck collar, ask if it is safe to drive while your neck heals. Do not lift anything that is heavier than 10 lb (4.5 kg). Rest as told by your doctor. Avoid positions and activities that make you feel worse. Do exercises as told by your doctor or physical therapist. Return to your normal activities when your doctor says that it is safe. General instructions Do not smoke or use any products that contain nicotine or tobacco. These can delay healing. If you need help quitting, ask your doctor. Keep all follow-up visits. Your doctor will check your injury and activity level. How is this prevented? Use good posture. Adjust your workstation to help with this. Exercise often as told by your doctor or physical therapist. Avoid activities that are risky or may cause a neck sprain. Contact a doctor if: Your symptoms get worse or do not get better after 2 weeks. Your pain gets worse. Medicine does not help your pain. You have new symptoms. Your neck collar gives you sores on your skin or bothers your skin. Get help right away if: You have very bad pain. You get any of the following in any part of your body: Loss of feeling. Tingling. Weakness. You cannot move a part of your body. You have neck pain and either of these: Very bad dizziness. A  very bad headache. This information is not intended to replace advice given to you by your health care provider. Make sure you discuss any questions you have with your health care provider. Document Revised: 04/14/2022 Document Reviewed: 04/14/2022 Elsevier Patient Education  2023 [employer].

## 2023-01-18 NOTE — Telephone Encounter (Signed)
Please advise. kh 

## 2023-01-18 NOTE — Assessment & Plan Note (Signed)
-   DG Cervical Spine Complete - tiZANidine (ZANAFLEX) 4 MG tablet; Take 1 tablet (4 mg total) by mouth every 6 (six) hours as needed for muscle spasms.  Dispense: 30 tablet; Refill: 0 - predniSONE (DELTASONE) 20 MG tablet; Take 1 tablet (20 mg total) by mouth daily with breakfast for 5 days.  Dispense: 5 tablet; Refill: 0 - ketorolac (TORADOL) 30 MG/ML injection 30 mg   Follow up:  Follow up as scheduled

## 2023-01-23 ENCOUNTER — Other Ambulatory Visit: Payer: Self-pay | Admitting: Nurse Practitioner

## 2023-01-23 ENCOUNTER — Other Ambulatory Visit: Payer: Medicare Other | Admitting: Pharmacist

## 2023-01-23 DIAGNOSIS — M542 Cervicalgia: Secondary | ICD-10-CM

## 2023-01-23 NOTE — Progress Notes (Signed)
Care Coordination Call  Spoke with patient. She notes significant continued pain related to her neck. Discussed with Halina Andreas, including recent imaging. Urgent referral to Orthopedics placed.   Contacted patient to notify. I also discussed the [facility] walk in Urgent Care in [place].   Catie Eppie Gibson, PharmD, BCACP, CPP [facility] [phone]

## 2023-01-29 ENCOUNTER — Telehealth: Payer: Self-pay | Admitting: Orthopaedic Surgery

## 2023-01-29 NOTE — Telephone Encounter (Signed)
Mailed reminder letter to patient today 

## 2023-02-05 ENCOUNTER — Other Ambulatory Visit: Payer: Self-pay | Admitting: Nurse Practitioner

## 2023-02-09 ENCOUNTER — Other Ambulatory Visit: Payer: Self-pay

## 2023-02-09 NOTE — Telephone Encounter (Signed)
Verbal was given to chang pt humalog  100 ut. To 10 unit vials due to other being on back order. KH

## 2023-02-09 NOTE — Telephone Encounter (Signed)
Pt pharmacy left message for pt's hemlog vial to be changed to since her regular one is out of stock!

## 2023-02-09 NOTE — Telephone Encounter (Signed)
Done KH 

## 2023-02-14 ENCOUNTER — Ambulatory Visit (HOSPITAL_BASED_OUTPATIENT_CLINIC_OR_DEPARTMENT_OTHER): Payer: Medicare Other | Admitting: Orthopaedic Surgery

## 2023-02-21 ENCOUNTER — Ambulatory Visit (HOSPITAL_BASED_OUTPATIENT_CLINIC_OR_DEPARTMENT_OTHER): Payer: Medicare Other | Admitting: Orthopaedic Surgery

## 2023-02-26 ENCOUNTER — Ambulatory Visit (INDEPENDENT_AMBULATORY_CARE_PROVIDER_SITE_OTHER): Payer: Medicare Other | Admitting: Nurse Practitioner

## 2023-02-26 ENCOUNTER — Encounter: Payer: Self-pay | Admitting: Nurse Practitioner

## 2023-02-26 VITALS — BP 171/67 | HR 98 | Ht 67.0 in | Wt 292.8 lb

## 2023-02-26 DIAGNOSIS — I5032 Chronic diastolic (congestive) heart failure: Secondary | ICD-10-CM | POA: Diagnosis not present

## 2023-02-26 DIAGNOSIS — J449 Chronic obstructive pulmonary disease, unspecified: Secondary | ICD-10-CM

## 2023-02-26 DIAGNOSIS — Z716 Tobacco abuse counseling: Secondary | ICD-10-CM | POA: Insufficient documentation

## 2023-02-26 DIAGNOSIS — M542 Cervicalgia: Secondary | ICD-10-CM | POA: Diagnosis not present

## 2023-02-26 DIAGNOSIS — E1165 Type 2 diabetes mellitus with hyperglycemia: Secondary | ICD-10-CM | POA: Diagnosis not present

## 2023-02-26 DIAGNOSIS — I10 Essential (primary) hypertension: Secondary | ICD-10-CM | POA: Diagnosis not present

## 2023-02-26 DIAGNOSIS — Z794 Long term (current) use of insulin: Secondary | ICD-10-CM

## 2023-02-26 DIAGNOSIS — R600 Localized edema: Secondary | ICD-10-CM

## 2023-02-26 LAB — POCT GLYCOSYLATED HEMOGLOBIN (HGB A1C): Hemoglobin A1C: 8.9 % — AB (ref 4.0–5.6)

## 2023-02-26 MED ORDER — METFORMIN HCL 1000 MG PO TABS
1000.0000 mg | ORAL_TABLET | Freq: Two times a day (BID) | ORAL | 1 refills | Status: AC
Start: 2023-02-26 — End: ?

## 2023-02-26 MED ORDER — ENTRESTO 97-103 MG PO TABS
1.0000 | ORAL_TABLET | Freq: Two times a day (BID) | ORAL | 4 refills | Status: AC
Start: 2023-02-26 — End: ?

## 2023-02-26 MED ORDER — FUROSEMIDE 40 MG PO TABS: ORAL_TABLET | ORAL | 1 refills | Status: DC

## 2023-02-26 MED ORDER — AMLODIPINE BESYLATE 10 MG PO TABS
ORAL_TABLET | ORAL | 1 refills | Status: AC
Start: 2023-02-26 — End: ?

## 2023-02-26 MED ORDER — SPIRONOLACTONE 25 MG PO TABS
ORAL_TABLET | ORAL | 1 refills | Status: DC
Start: 2023-02-26 — End: 2023-02-27

## 2023-02-26 MED ORDER — CARVEDILOL 25 MG PO TABS: 25.0000 mg | ORAL_TABLET | Freq: Two times a day (BID) | ORAL | 4 refills | Status: DC

## 2023-02-26 NOTE — Assessment & Plan Note (Signed)
Continue Lasix 40 mg twice daily, Spironolactone 25 mg daily DASH diet advised Keep legs elevated when sitting to help decrease swelling

## 2023-02-26 NOTE — Assessment & Plan Note (Addendum)
Continue Lasix, spironolactone, Coreg, Entresto DASH diet advised Patient encouraged to call cardiology office to schedule a follow-up appointment as soon as possible. All medications were refilled

## 2023-02-26 NOTE — Assessment & Plan Note (Signed)
BP Readings from Last 3 Encounters:  02/26/23 (!) 171/67  01/18/23 (!) 165/86  12/13/22 (!) 159/73  Hypertension uncontrolled, patient is asymptomatic She has been out of all her medications Medications were refilled today  Discussed DASH diet and dietary sodium restrictions Continue to increase dietary efforts and exercise.  Patient encouraged to call cardiology office and schedule an appointment as soon as possible

## 2023-02-26 NOTE — Progress Notes (Signed)
Established Patient Office Visit  Subjective:  Patient ID: Sharon Swanson, female    DOB: 12-Jul-1961  Age: 62 y.o. MRN: [id number]  CC:  Chief Complaint  Patient presents with   Follow-up    HPI Sharon Swanson is a 62 y.o. female  has a past medical history of Abnormal Pap smear of cervix (10/2020), Acid reflux, Allergy, Asthma, Atypical squamous cells of undetermined significance (ASC-US) on cervical Pap smear (10/2020), Bilateral lower extremity edema (01/2020), COPD (chronic obstructive pulmonary disease) (HCC), CPAP (continuous positive airway pressure) dependence, Diabetes (HCC), Diabetes mellitus without complication (HCC), Fatty liver (11/08/2011), Fibroids (11/08/2011), Hyperglycemia, Hyperlipidemia (05/2020), Hypertension, Microalbuminuria (01/2020), Obesity (BMI 30-39.9) (11/08/2011), Osteoarthritis of right acromioclavicular joint, Pancreatitis (11/08/2011), Rotator cuff tear, right, Shortness of breath on exertion (01/2020), Sleep apnea, Ventral hernia, and Vitamin D deficiency (05/2020).   Patient presents for follow-up for type 2 diabetes.   Type 2 diabetes.  Currently on Trulicity 1.5 mg once weekly injection, metformin 1000 mg twice daily, Lantus 16 units daily.  Humalog is ordered to be taking 10 units twice daily but patient stated that she has been taking Humalog 30 units daily since about a week ago due to high blood sugars.  She now reports blood sugar readings of 180s to 200s since she went up on her dose of Humalog.  Referred to endocrinologist but she has not followed up with them.  Does some exercises, she has been avoiding soda, juice.   Hypertension/CHF  Currently on amlodipine 10 mg daily, carvedilol 25 mg twice daily, furosemide 40 mg twice daily, Entresto 97-103 mg 1 tablet by mouth twice daily, spironolactone 25 mg daily.  She denies chest pain, shortness of breath, dizziness.  Has some nonpitting edema bilaterally.  Stated that she has been out of all of her  medications for the past 2 days .she is past due for follow-up with cardiology.  Smokes 3 cigarettes daily used to smoke 1 pack of cigarettes daily in the past.  She denies shortness of breath cough wheezing.         Past Medical History:  Diagnosis Date   Abnormal Pap smear of cervix 10/2020   Acid reflux    Allergy    Asthma    Atypical squamous cells of undetermined significance (ASC-US) on cervical Pap smear 10/2020   Bilateral lower extremity edema 01/2020   COPD (chronic obstructive pulmonary disease) (HCC)    CPAP (continuous positive airway pressure) dependence    Diabetes (HCC)    Diabetes mellitus without complication (HCC)    Type II   Fatty liver 11/08/2011   Fibroids 11/08/2011   Hyperglycemia    Hyperlipidemia 05/2020   Hypertension    Microalbuminuria 01/2020   Obesity (BMI 30-39.9) 11/08/2011   Osteoarthritis of right acromioclavicular joint    Pancreatitis 11/08/2011   Rotator cuff tear, right    Shortness of breath on exertion 01/2020   Sleep apnea    Ventral hernia    Vitamin D deficiency 05/2020    Past Surgical History:  Procedure Laterality Date   COLONOSCOPY     ORTHOPEDIC SURGERY     R ankle, tendonitis   UMBILICAL HERNIA REPAIR N/A 09/30/2021   Procedure: INCARCERATED HERNIA REPAIR UMBILICAL ADULT;  Surgeon: Fritzi Mandes, MD;  Location: [facility];  Service: General;  Laterality: N/A;    Family History  Problem Relation Age of Onset   Hypertension Mother    Diabetes Maternal Grandmother    Hypertension Maternal Grandmother    Diabetes  Other    Colon cancer Neg Hx    Esophageal cancer Neg Hx    Rectal cancer Neg Hx    Stomach cancer Neg Hx     Social History   Socioeconomic History   Marital status: Married    Spouse name: Not on file   Number of children: Not on file   Years of education: Not on file   Highest education level: Not on file  Occupational History   Not on file  Tobacco Use   Smoking status: Every Day     Packs/day: 0.10    Years: 39.00    Additional pack years: 0.00    Total pack years: 3.90    Types: Cigarettes    Start date: 09/25/1980   Smokeless tobacco: Never   Tobacco comments:    pt states she smoke 2 cigerettes a day. States she uses nicotine patches.  Vaping Use   Vaping Use: Never used  Substance and Sexual Activity   Alcohol use: No   Drug use: No   Sexual activity: Not Currently    Partners: Male    Birth control/protection: Post-menopausal  Other Topics Concern   Not on file  Social History Narrative   Not on file   Social Determinants of Health   Financial Resource Strain: Medium Risk (06/08/2021)   Overall Financial Resource Strain (CARDIA)    Difficulty of Paying Living Expenses: Somewhat hard  Food Insecurity: No Food Insecurity (06/08/2021)   Hunger Vital Sign    Worried About Running Out of Food in the Last Year: Never true    Ran Out of Food in the Last Year: Never true  Transportation Needs: No Transportation Needs (06/08/2021)   PRAPARE - Administrator, Civil Service (Medical): No    Lack of Transportation (Non-Medical): No  Physical Activity: Inactive (06/08/2021)   Exercise Vital Sign    Days of Exercise per Week: 0 days    Minutes of Exercise per Session: 0 min  Stress: Stress Concern Present (06/08/2021)   [employer] of Occupational Health - Occupational Stress Questionnaire    Feeling of Stress : To some extent  Social Connections: Socially Integrated (06/08/2021)   Social Connection and Isolation Panel [NHANES]    Frequency of Communication with Friends and Family: Three times a week    Frequency of Social Gatherings with Friends and Family: Three times a week    Attends Religious Services: More than 4 times per year    Active Member of Clubs or Organizations: Yes    Attends Banker Meetings: 1 to 4 times per year    Marital Status: Married  Catering manager Violence: Not At Risk (06/08/2021)   Humiliation, Afraid,  Rape, and Kick questionnaire    Fear of Current or Ex-Partner: No    Emotionally Abused: No    Physically Abused: No    Sexually Abused: No    Outpatient Medications Prior to Visit  Medication Sig Dispense Refill   acetaminophen (TYLENOL) 325 MG tablet Take 2 tablets (650 mg total) by mouth every 6 (six) hours as needed.     albuterol (VENTOLIN HFA) 108 (90 Base) MCG/ACT inhaler Inhale 2 puffs into the lungs every 4 (four) hours as needed for wheezing or shortness of breath. 6.7 g 0   aspirin EC 81 MG tablet Take 1 tablet (81 mg total) by mouth daily. Swallow whole. 90 tablet 3   atorvastatin (LIPITOR) 40 MG tablet TAKE 1 TABLET(40 MG) BY MOUTH DAILY  90 tablet 0   cetirizine (ZYRTEC) 10 MG tablet Take 1 tablet (10 mg total) by mouth in the morning. 90 tablet 3   Continuous Blood Gluc Receiver (FREESTYLE LIBRE 2 READER) DEVI Use as directed daily. 6 each 3   Continuous Blood Gluc Sensor (FREESTYLE LIBRE 2 SENSOR) MISC 1 application by Does not apply route every 14 (fourteen) days. 2 each 11   Dulaglutide (TRULICITY) 1.5 MG/0.5ML SOPN Inject 1.5 mg into the skin once a week. 2 mL 2   DULoxetine (CYMBALTA) 60 MG capsule TAKE 1 CAPSULE(60 MG) BY MOUTH DAILY 30 capsule 0   fluticasone (FLONASE) 50 MCG/ACT nasal spray SHAKE LIQUID AND USE 1 SPRAY IN EACH NOSTRIL DAILY 16 g 0   gabapentin (NEURONTIN) 300 MG capsule TAKE 1 CAPSULE(300 MG) BY MOUTH THREE TIMES DAILY 90 capsule 0   insulin glargine (LANTUS) 100 UNIT/ML injection Inject 0.16 mLs (16 Units total) into the skin at bedtime. 15 mL 0   insulin lispro (HUMALOG) 100 UNIT/ML injection Inject 0.1 mLs (10 Units total) into the skin 2 (two) times daily with a meal. 9 mL 3   linaclotide (LINZESS) 145 MCG CAPS capsule Take 1 capsule (145 mcg total) by mouth daily before breakfast. 30 capsule 0   ondansetron (ZOFRAN) 4 MG tablet Take 1 tablet (4 mg total) by mouth every 8 (eight) hours as needed for nausea or vomiting. 60 tablet 0   tiZANidine  (ZANAFLEX) 4 MG tablet Take 1 tablet (4 mg total) by mouth every 6 (six) hours as needed for muscle spasms. 30 tablet 0   amLODipine (NORVASC) 10 MG tablet TAKE 1 TABLET(10 MG) BY MOUTH DAILY 90 tablet 0   carvedilol (COREG) 25 MG tablet Take 1 tablet (25 mg total) by mouth 2 (two) times daily. 60 tablet 0   furosemide (LASIX) 40 MG tablet TAKE 1 TABLET(40 MG) BY MOUTH TWICE DAILY 180 tablet 0   metFORMIN (GLUCOPHAGE) 1000 MG tablet Take 1 tablet (1,000 mg total) by mouth 2 (two) times daily with a meal. 180 tablet 1   sacubitril-valsartan (ENTRESTO) 97-103 MG TAKE 1 TABLET BY MOUTH TWICE DAILY 60 tablet 1   spironolactone (ALDACTONE) 25 MG tablet TAKE 1 TABLET(25 MG) BY MOUTH DAILY 90 tablet 0   amoxicillin (AMOXIL) 875 MG tablet Take 875 mg by mouth 2 (two) times daily. For dental procedure (Patient not taking: Reported on 01/23/2023)     ipratropium-albuterol (DUONEB) 0.5-2.5 (3) MG/3ML SOLN Take 3 mLs by nebulization every 6 (six) hours as needed (shortness of breath, wheeze). 360 mL 12   traMADol (ULTRAM) 50 MG tablet Take 1 tablet (50 mg total) by mouth every 6 (six) hours as needed for moderate pain or severe pain. (Patient not taking: Reported on 01/23/2023) 120 tablet 2   Facility-Administered Medications Prior to Visit  Medication Dose Route Frequency Provider Last Rate Last Admin   0.9 %  sodium chloride infusion  500 mL Intravenous Once Pyrtle, Carie Caddy, MD        Allergies  Allergen Reactions   Aspirin Other (See Comments)    Abdominal pain Abdominal pain Abdominal pain Abdominal pain Abdominal pain Abdominal pain Abdominal pain    ROS Review of Systems  Constitutional:  Positive for fatigue. Negative for activity change, appetite change, chills and fever.  HENT:  Negative for ear discharge, ear pain and hearing loss.   Respiratory:  Negative for cough, chest tightness, shortness of breath and wheezing.   Cardiovascular:  Negative for chest pain, palpitations and  leg  swelling.  Gastrointestinal:  Negative for abdominal distention, abdominal pain, blood in stool, constipation and diarrhea.  Endocrine: Negative for cold intolerance, heat intolerance, polydipsia, polyphagia and polyuria.  Genitourinary:  Negative for difficulty urinating, dysuria, flank pain, frequency, hematuria, menstrual problem, pelvic pain and vaginal bleeding.  Musculoskeletal:  Positive for arthralgias and gait problem. Negative for back pain, joint swelling and myalgias.       Using a walker for ambulation  Skin:  Negative for pallor, rash and wound.  Allergic/Immunologic: Negative for environmental allergies, food allergies and immunocompromised state.  Neurological:  Negative for dizziness, tremors, facial asymmetry, weakness and headaches.  Hematological:  Negative for adenopathy. Does not bruise/bleed easily.  Psychiatric/Behavioral:  Negative for agitation, behavioral problems, confusion, decreased concentration, hallucinations, self-injury and suicidal ideas.       Objective:    Physical Exam Vitals and nursing note reviewed.  Constitutional:      General: She is not in acute distress.    Appearance: Normal appearance. She is obese. She is not ill-appearing, toxic-appearing or diaphoretic.  HENT:     Mouth/Throat:     Mouth: Mucous membranes are moist.     Pharynx: Oropharynx is clear. No oropharyngeal exudate or posterior oropharyngeal erythema.  Eyes:     General: No scleral icterus.       Right eye: No discharge.        Left eye: No discharge.     Extraocular Movements: Extraocular movements intact.     Conjunctiva/sclera: Conjunctivae normal.  Cardiovascular:     Rate and Rhythm: Normal rate and regular rhythm.     Pulses: Normal pulses.     Heart sounds: Normal heart sounds. No murmur heard.    No friction rub. No gallop.  Pulmonary:     Effort: Pulmonary effort is normal. No respiratory distress.     Breath sounds: Normal breath sounds. No stridor. No  wheezing, rhonchi or rales.  Chest:     Chest wall: No tenderness.  Abdominal:     General: There is no distension.     Palpations: Abdomen is soft.     Tenderness: There is no abdominal tenderness. There is no right CVA tenderness, left CVA tenderness or guarding.  Musculoskeletal:        General: No swelling, tenderness, deformity or signs of injury.     Right lower leg: Edema present.     Left lower leg: Edema present.     Comments: Non pitting edema bilateral lower extremities. Using a rolling walker for ambulation  Skin:    General: Skin is warm and dry.     Capillary Refill: Capillary refill takes less than 2 seconds.     Coloration: Skin is not jaundiced or pale.     Findings: No bruising, erythema or lesion.  Neurological:     Mental Status: She is alert and oriented to person, place, and time.     Motor: No weakness.     Coordination: Coordination normal.     Gait: Gait normal.  Psychiatric:        Mood and Affect: Mood normal.        Behavior: Behavior normal.        Thought Content: Thought content normal.        Judgment: Judgment normal.     BP (!) 171/67   Pulse 98   Ht 5\' 7"  (1.702 m)   Wt 292 lb 12.8 oz (132.8 kg)   SpO2 97%   BMI 45.86  kg/m  Wt Readings from Last 3 Encounters:  02/26/23 292 lb 12.8 oz (132.8 kg)  01/18/23 296 lb 12.8 oz (134.6 kg)  12/13/22 298 lb (135.2 kg)    Lab Results  Component Value Date   TSH 1.140 06/12/2022   Lab Results  Component Value Date   WBC 4.2 11/27/2022   HGB 15.3 11/27/2022   HCT 47.5 (H) 11/27/2022   MCV 91 11/27/2022   PLT 225 11/27/2022   Lab Results  Component Value Date   NA 139 11/27/2022   K 4.7 11/27/2022   CO2 25 11/27/2022   GLUCOSE 125 (H) 11/27/2022   BUN 12 11/27/2022   CREATININE 0.76 11/27/2022   BILITOT 0.3 11/27/2022   ALKPHOS 92 11/27/2022   AST 26 11/27/2022   ALT 45 (H) 11/27/2022   PROT 7.1 11/27/2022   ALBUMIN 4.2 11/27/2022   CALCIUM 9.8 11/27/2022   ANIONGAP 9  10/06/2021   EGFR 89 11/27/2022   GFR 128.72 03/01/2020   Lab Results  Component Value Date   CHOL 256 (H) 11/27/2022   Lab Results  Component Value Date   HDL 79 11/27/2022   Lab Results  Component Value Date   LDLCALC 159 (H) 11/27/2022   Lab Results  Component Value Date   TRIG 107 11/27/2022   Lab Results  Component Value Date   CHOLHDL 3.2 11/27/2022   Lab Results  Component Value Date   HGBA1C 8.9 (A) 02/26/2023      Assessment & Plan:   Problem List Items Addressed This Visit       Cardiovascular and Mediastinum   Essential hypertension    BP Readings from Last 3 Encounters:  02/26/23 (!) 171/67  01/18/23 (!) 165/86  12/13/22 (!) 159/73  Hypertension uncontrolled, patient is asymptomatic She has been out of all her medications Medications were refilled today  Discussed DASH diet and dietary sodium restrictions Continue to increase dietary efforts and exercise.  Patient encouraged to call cardiology office and schedule an appointment as soon as possible      Relevant Medications   amLODipine (NORVASC) 10 MG tablet   carvedilol (COREG) 25 MG tablet   spironolactone (ALDACTONE) 25 MG tablet   sacubitril-valsartan (ENTRESTO) 97-103 MG   furosemide (LASIX) 40 MG tablet   Chronic diastolic CHF (congestive heart failure) (HCC)    Continue Lasix, spironolactone, Coreg, Entresto DASH diet advised Patient encouraged to call cardiology office to schedule a follow-up appointment as soon as possible. All medications were refilled       Relevant Medications   amLODipine (NORVASC) 10 MG tablet   carvedilol (COREG) 25 MG tablet   spironolactone (ALDACTONE) 25 MG tablet   sacubitril-valsartan (ENTRESTO) 97-103 MG   furosemide (LASIX) 40 MG tablet     Respiratory   COPD (chronic obstructive pulmonary disease) (HCC)    She is followed by pulmonology Continue albuterol inhaler 2 puffs every 4 hours as needed Smoking cessation education completed         Endocrine   Type 2 diabetes mellitus with hyperglycemia, with long-term current use of insulin (HCC) - Primary    Lab Results  Component Value Date   HGBA1C 8.9 (A) 02/26/2023  Chronic uncontrolled condition Currently on Trulicity 1.5 mg once weekly injection, metformin 1000 mg twice daily, Lantus 16 units daily.  Humalog is ordered to be taking 10 units twice daily but patient stated that she has been taking Humalog 30 units daily No changes in medication today, follow back to endocrinologist given  patient encouraged to call the office and schedule an appointment.  She is also followed by Catie the clinical pharmacist who has been assisting with medication management has upcoming appointment with her tomorrow. Patient counseled on low-carb modified diet. CBG goals discussed       Relevant Medications   metFORMIN (GLUCOPHAGE) 1000 MG tablet   Other Relevant Orders   POCT glycosylated hemoglobin (Hb A1C) (Completed)     Other   Bilateral lower extremity edema    Continue Lasix 40 mg twice daily, Spironolactone 25 mg daily DASH diet advised Keep legs elevated when sitting to help decrease swelling      Neck pain on right side    Patient feels better on tizanidine 4 mg every 8 hours as needed, tramadol 50 mg every 6 hours as needed Application of heat OR  ice, Tylenol 650 mg every 6 hours as needed encouraged Maintain close follow-up with orthopedics      Encounter for smoking cessation counseling    Smokes about 3 cigarettes day  Asked about quitting: confirms that he/she currently smokes cigarettes Advise to quit smoking: Educated about QUITTING to reduce the risk of cancer, cardio and cerebrovascular disease. Assess willingness: Unwilling to quit at this time, but is working on cutting back. Assist with counseling and pharmacotherapy: Counseled for 5 minutes and literature provided. Arrange for follow up: follow up in 3 months and continue to offer help.        Meds  ordered this encounter  Medications   amLODipine (NORVASC) 10 MG tablet    Sig: TAKE 1 TABLET(10 MG) BY MOUTH DAILY    Dispense:  90 tablet    Refill:  1    **Patient requests 90 days supply**   carvedilol (COREG) 25 MG tablet    Sig: Take 1 tablet (25 mg total) by mouth 2 (two) times daily.    Dispense:  60 tablet    Refill:  4   metFORMIN (GLUCOPHAGE) 1000 MG tablet    Sig: Take 1 tablet (1,000 mg total) by mouth 2 (two) times daily with a meal.    Dispense:  180 tablet    Refill:  1   spironolactone (ALDACTONE) 25 MG tablet    Sig: TAKE 1 TABLET(25 MG) BY MOUTH DAILY    Dispense:  90 tablet    Refill:  1    **Patient requests 90 days supply**   sacubitril-valsartan (ENTRESTO) 97-103 MG    Sig: Take 1 tablet by mouth 2 (two) times daily.    Dispense:  60 tablet    Refill:  4   furosemide (LASIX) 40 MG tablet    Sig: TAKE 1 TABLET(40 MG) BY MOUTH TWICE DAILY    Dispense:  180 tablet    Refill:  1    **Patient requests 90 days supply**    Follow-up: Return in about 3 months (around 05/29/2023) for HTN, DM.    Donell Beers, FNP

## 2023-02-26 NOTE — Assessment & Plan Note (Signed)
She is followed by pulmonology Continue albuterol inhaler 2 puffs every 4 hours as needed Smoking cessation education completed

## 2023-02-26 NOTE — Assessment & Plan Note (Signed)
Lab Results  Component Value Date   HGBA1C 8.9 (A) 02/26/2023  Chronic uncontrolled condition Currently on Trulicity 1.5 mg once weekly injection, metformin 1000 mg twice daily, Lantus 16 units daily.  Humalog is ordered to be taking 10 units twice daily but patient stated that she has been taking Humalog 30 units daily No changes in medication today, follow back to endocrinologist given patient encouraged to call the office and schedule an appointment.  She is also followed by Catie the clinical pharmacist who has been assisting with medication management has upcoming appointment with her tomorrow. Patient counseled on low-carb modified diet. CBG goals discussed

## 2023-02-26 NOTE — Assessment & Plan Note (Signed)
   Smokes about 3 cigarettes/day  Asked about quitting: confirms that he/she currently smokes cigarettes Advise to quit smoking: Educated about QUITTING to reduce the risk of cancer, cardio and cerebrovascular disease. Assess willingness: Unwilling to quit at this time, but is working on cutting back. Assist with counseling and pharmacotherapy: Counseled for 5 minutes and literature provided. Arrange for follow up: follow up in 3 months and continue to offer help.  

## 2023-02-26 NOTE — Patient Instructions (Signed)
Goal for fasting blood sugar ranges from 80 to 120 and 2 hours after any meal or at bedtime should be between 130 to 170.      1. Type 2 diabetes mellitus with hyperglycemia, with long-term current use of insulin (HCC)  - POCT glycosylated hemoglobin (Hb A1C) - Ambulatory referral to Endocrinology - metFORMIN (GLUCOPHAGE) 1000 MG tablet; Take 1 tablet (1,000 mg total) by mouth 2 (two) times daily with a meal.  Dispense: 180 tablet; Refill: 1  2. Chronic diastolic CHF (congestive heart failure) (HCC)  - amLODipine (NORVASC) 10 MG tablet; TAKE 1 TABLET(10 MG) BY MOUTH DAILY  Dispense: 90 tablet; Refill: 1 - carvedilol (COREG) 25 MG tablet; Take 1 tablet (25 mg total) by mouth 2 (two) times daily.  Dispense: 60 tablet; Refill: 4 - spironolactone (ALDACTONE) 25 MG tablet; TAKE 1 TABLET(25 MG) BY MOUTH DAILY  Dispense: 90 tablet; Refill: 1 - sacubitril-valsartan (ENTRESTO) 97-103 MG; Take 1 tablet by mouth 2 (two) times daily.  Dispense: 60 tablet; Refill: 4  3. Essential hypertension  - amLODipine (NORVASC) 10 MG tablet; TAKE 1 TABLET(10 MG) BY MOUTH DAILY  Dispense: 90 tablet; Refill: 1 - carvedilol (COREG) 25 MG tablet; Take 1 tablet (25 mg total) by mouth 2 (two) times daily.  Dispense: 60 tablet; Refill: 4 - spironolactone (ALDACTONE) 25 MG tablet; TAKE 1 TABLET(25 MG) BY MOUTH DAILY  Dispense: 90 tablet; Refill: 1 - sacubitril-valsartan (ENTRESTO) 97-103 MG; Take 1 tablet by mouth 2 (two) times daily.  Dispense: 60 tablet; Refill: 4  4. Neck pain on right side     5. Bilateral lower extremity edema   It is important that you exercise regularly at least 30 minutes 5 times a week as tolerated  Think about what you will eat, plan ahead. Choose " clean, green, fresh or frozen" over canned, processed or packaged foods which are more sugary, salty and fatty. 70 to 75% of food eaten should be vegetables and fruit. Three meals at set times with snacks allowed between meals, but they  must be fruit or vegetables. Aim to eat over a 12 hour period , example 7 am to 7 pm, and STOP after  your last meal of the day. Drink water,generally  no other drink is as healthy. Fruit juice is best enjoyed in a healthy way, by EATING the fruit.  Thanks for choosing [facility] we consider it a privelige to serve you.

## 2023-02-26 NOTE — Assessment & Plan Note (Signed)
Patient feels better on tizanidine 4 mg every 8 hours as needed, tramadol 50 mg every 6 hours as needed Application of heat OR  ice, Tylenol 650 mg every 6 hours as needed encouraged Maintain close follow-up with orthopedics

## 2023-02-27 ENCOUNTER — Other Ambulatory Visit: Payer: Medicare Other | Admitting: Pharmacist

## 2023-02-27 DIAGNOSIS — I10 Essential (primary) hypertension: Secondary | ICD-10-CM

## 2023-02-27 DIAGNOSIS — R7309 Other abnormal glucose: Secondary | ICD-10-CM

## 2023-02-27 DIAGNOSIS — R739 Hyperglycemia, unspecified: Secondary | ICD-10-CM

## 2023-02-27 DIAGNOSIS — E1165 Type 2 diabetes mellitus with hyperglycemia: Secondary | ICD-10-CM

## 2023-02-27 MED ORDER — INSULIN GLARGINE 100 UNIT/ML ~~LOC~~ SOLN
20.0000 [IU] | Freq: Every day | SUBCUTANEOUS | 0 refills | Status: AC
Start: 2023-02-27 — End: ?

## 2023-02-27 MED ORDER — SPIRONOLACTONE 50 MG PO TABS: 50.0000 mg | ORAL_TABLET | Freq: Every day | ORAL | 1 refills | Status: DC

## 2023-02-27 MED ORDER — OZEMPIC (0.25 OR 0.5 MG/DOSE) 2 MG/3ML ~~LOC~~ SOPN: 0.5000 mg | PEN_INJECTOR | SUBCUTANEOUS | 2 refills | Status: DC

## 2023-02-27 MED ORDER — ATORVASTATIN CALCIUM 80 MG PO TABS: 80.0000 mg | ORAL_TABLET | Freq: Every day | ORAL | 3 refills | Status: DC

## 2023-02-27 NOTE — Progress Notes (Signed)
02/27/2023 Name: Sharon Swanson MRN: [id number] DOB: [birth year]  Chief Complaint  Patient presents with   Medication Management   Diabetes   Hypertension   Hyperlipidemia    Sharon Swanson is a 62 y.o. year old female who presented for a telephone visit.   They were referred to the pharmacist by their PCP for assistance in managing diabetes, hypertension, and hyperlipidemia.    Subjective:  Care Team: Primary Care Provider: Ivonne Andrew, Swanson ; Next Scheduled Visit: BP follow up 6/17  Medication Access/Adherence  Current Pharmacy:  [id number] G[place] Otto, [place] - [address] AT [facility] [address] [place] [address] Phone: [phone] Fax: [phone]   Patient reports affordability concerns with their medications: No  Patient reports access/transportation concerns to their pharmacy: No  Patient reports adherence concerns with their medications:  No    Reports she had been out of amlodipine for a few days.   Diabetes:  Current medications: metformin 1000 mg twice daily, Trulicity 1.5 mg weekly, Lantus 16 units daily, Humalog 30 units twice daily - self increased due to hyperglycemia  Current glucose readings: reports readings remain 180-200s, generally in 200s  Patient denies hypoglycemic s/sx including dizziness, shakiness, sweating. Patient denies hyperglycemic symptoms including polyuria, polydipsia, polyphagia, nocturia, neuropathy, blurred vision.  Hyperlipidemia/ASCVD Risk Reduction  Current lipid lowering medications: atorvastatin 40 mg daily  Antiplatelet regimen: aspirin 81 mg daily  Heart Failure:  Current medications:  ACEi/ARB/ARNI: Entresto 97/103 mg twice daily SGLT2i: none, will add once improvement in glycemic control Beta blocker: carvedilol 25 mg twice daily Mineralocorticoid Receptor Antagonist: spironolactone 25 mg daily Diuretic regimen: furosemide 40 mg daily  Current home blood pressure readings:  reports BP at the dentist was 190s/100s, but SBP 170 yesterday at PCP office. Reports that she did run out of amlodipine, but even when she had, BP was elevated ~170s  Objective:  Lab Results  Component Value Date   HGBA1C 8.9 (A) 02/26/2023    Lab Results  Component Value Date   CREATININE 0.76 11/27/2022   BUN 12 11/27/2022   NA 139 11/27/2022   K 4.7 11/27/2022   CL 101 11/27/2022   CO2 25 11/27/2022    Lab Results  Component Value Date   CHOL 256 (H) 11/27/2022   HDL 79 11/27/2022   LDLCALC 159 (H) 11/27/2022   TRIG 107 11/27/2022   CHOLHDL 3.2 11/27/2022    Medications Reviewed Today     Reviewed by Sharon Swanson (Nurse Practitioner) on 02/26/23 at 1026  Med List Status: <None>   Medication Order Taking? Sig Documenting Provider Last Dose Status Informant  0.9 %  sodium chloride infusion [id number]   Sharon Swanson  Active   acetaminophen (TYLENOL) 325 MG tablet [id number] Yes Take 2 tablets (650 mg total) by mouth every 6 (six) hours as needed. Sharon Swanson Taking Active            Med Note [facility], Sharon Swanson   Wed Dec 13, 2022 10:56 AM) Needs refill   albuterol (VENTOLIN HFA) 108 (90 Base) MCG/ACT inhaler [id number] Yes Inhale 2 puffs into the lungs every 4 (four) hours as needed for wheezing or shortness of breath. Sharon Swanson Taking Active   amLODipine (NORVASC) 10 MG tablet [id number]  TAKE 1 TABLET(10 MG) BY MOUTH DAILY Sharon Swanson  Active   amoxicillin (AMOXIL) 875 MG tablet [id number] No Take 875 mg by mouth 2 (two) times daily. For dental procedure  Patient not taking: Reported on 01/23/2023   Sharon Swanson Not Taking Active   aspirin EC 81 MG tablet [id number] Yes Take 1 tablet (81 mg total) by mouth daily. Sharon Swanson. Sharon Swanson Taking Active   atorvastatin (LIPITOR) 40 MG tablet [id number] Yes TAKE 1 TABLET(40 MG) BY MOUTH DAILY Sharon Andrew, Swanson Taking Active   carvedilol (COREG)  25 MG tablet [id number]  Take 1 tablet (25 mg total) by mouth 2 (two) times daily. Sharon Swanson  Active   cetirizine (ZYRTEC) 10 MG tablet [id number] Yes Take 1 tablet (10 mg total) by mouth in the morning. Sharon Swanson Taking Active Self  Continuous Blood Gluc Receiver (FREESTYLE LIBRE 2 READER) DEVI [id number] Yes Use as directed daily. Sharon Swanson Taking Active   Continuous Blood Gluc Sensor (FREESTYLE LIBRE 2 SENSOR) MISC [id number] Yes 1 application by Does not apply route every 14 (fourteen) days. Sharon Swanson Taking Active   Dulaglutide (TRULICITY) 1.5 MG/0.5ML Namon Cirri [id number] Yes Inject 1.5 mg into the skin once a week. Sharon Andrew, Swanson Taking Active   DULoxetine (CYMBALTA) 60 MG capsule [id number] Yes TAKE 1 CAPSULE(60 MG) BY MOUTH DAILY Sharon Andrew, Swanson Taking Active   fluticasone (FLONASE) 50 MCG/ACT nasal spray [id number] Yes SHAKE LIQUID AND USE 1 SPRAY IN EACH NOSTRIL DAILY Sharon Andrew, Swanson Taking Active   furosemide (LASIX) 40 MG tablet [id number]  TAKE 1 TABLET(40 MG) BY MOUTH TWICE DAILY Sharon Swanson  Active   gabapentin (NEURONTIN) 300 MG capsule [id number] Yes TAKE 1 CAPSULE(300 MG) BY MOUTH THREE TIMES DAILY Sharon Andrew, Swanson Taking Active   insulin glargine (LANTUS) 100 UNIT/ML injection [id number] Yes Inject 0.16 mLs (16 Units total) into the skin at bedtime. Sharon Andrew, Swanson Taking Active            Med Note Sharon Swanson Feb 26, 2023 10:17 AM) Taking 16 units daily  insulin lispro (HUMALOG) 100 UNIT/ML injection [id number] Yes Inject 0.1 mLs (10 Units total) into the skin 2 (two) times daily with a meal. Sharon Andrew, Swanson Taking Active            Med Note Sharon Swanson Feb 26, 2023 10:16 AM) Taking 30 units daily   ipratropium-albuterol (DUONEB) 0.5-2.5 (3) MG/3ML SOLN [id number]  Take 3 mLs by nebulization every 6 (six) hours as needed (shortness of breath, wheeze). Sharon Swanson   Expired 01/18/23 2359 Self  linaclotide (LINZESS) 145 MCG CAPS capsule [id number] Yes Take 1 capsule (145 mcg total) by mouth daily before breakfast. Sharon Andrew, Swanson Taking Active   metFORMIN (GLUCOPHAGE) 1000 MG tablet [id number]  Take 1 tablet (1,000 mg total) by mouth 2 (two) times daily with a meal. Sharon Swanson  Active   ondansetron (ZOFRAN) 4 MG tablet [id number] Yes Take 1 tablet (4 mg total) by mouth every 8 (eight) hours as needed for nausea or vomiting. Lovorn, Megan, Swanson Taking Active Self  sacubitril-valsartan (ENTRESTO) 97-103 MG [id number]  Take 1 tablet by mouth 2 (two) times daily. Sharon Swanson  Active   spironolactone (ALDACTONE) 25 MG tablet [id number]  TAKE 1 TABLET(25 MG) BY MOUTH DAILY Sharon Swanson  Active   tiZANidine (ZANAFLEX) 4 MG tablet [id number] Yes Take 1 tablet (4 mg total) by mouth every 6 (six) hours as needed for muscle spasms. Sharon Andrew,  Swanson Taking Active   traMADol (ULTRAM) 50 MG tablet [id number] No Take 1 tablet (50 mg total) by mouth every 6 (six) hours as needed for moderate pain or severe pain.  Patient not taking: Reported on 01/23/2023   Sharon Swanson Not Taking Active            Med Note Clearance Coots, Ambers Iyengar T   Tue Dec 26, 2022  8:40 AM)                Assessment/Plan:   Diabetes: - Currently uncontrolled - Reviewed long term cardiovascular and renal outcomes of uncontrolled blood sugar - Reviewed goal A1c, goal fasting, and goal 2 hour post prandial glucose - Would want to increase Trulicity, but 3 mg dose on backorder right now. Recommend to change to Ozempic 0.5 mg weekly with subsequent titration as tolerated. Discussed with PCP, she is in agreement - Recommend to check glucose continuously using CGM  Hyperlipidemia/ASCVD Risk Reduction: - Currently uncontrolled, fill history indicates adherence. Recommend to increase atorvastatin to 80 mg daily. Repeat lipids with next visit. Discussed with  PCP  Heart Failure: - Currently suboptimally managed - Reviewed appropriate blood pressure monitoring technique and reviewed goal blood pressure - Considered increasing spironolactone vs carvedilol. Given HFpEF, elect to increase spironolactone today. BMP in 2 weeks with BP check follow up. Discussed with Halina Andreas, she is in agreement. Continue amlodipine 10 mg daily, Entresto 97/103 mg twice daily, carvedilol 25 mg twice daily at this time. Will add SGLT2 when improvement in glycemic control.    Follow Up Plan: BP follow up in 2 weeks, will coordinate.   Catie Eppie Gibson, PharmD, BCACP, CPP [facility] [phone]

## 2023-02-27 NOTE — Patient Instructions (Addendum)
Zenaida Niece what Trulicity you have, then start Ozempic 0.5 mg weekly. Continue Lantus 20 units daily but reduce Humalog back to 10 units with meals. Continue metformin 1000 mg twice daily.   Check your blood sugars twice daily:  1) Fasting, first thing in the morning before breakfast and  2) 2 hours after your largest meal.   For a goal A1c of less than 7%, goal fasting readings are less than 130 and goal 2 hour after meal readings are less than 180.    For blood pressure, continue Entresto, carvedilol, and amlodipine at your current doses. Increase spironolactone to 50 mg daily. Please keep your nurse visit in 2 weeks for a blood pressure check because we will to check labs after this change. You can take 2 of the 25 mg tablets together every morning until you complete that supply   Increase atorvastatin to 80 mg daily. You can take two of the 40 mg tablets together every day until you complete that supply, then fill the 80 mg tablet strength.  Please reach out with any issues. I will coordinate with the team after we see your blood pressure and the lab results on the 17th.   Check your blood pressure once daily, and any time you have concerning symptoms like headache, chest pain, dizziness, shortness of breath, or vision changes.   Our goal is less than 130/80.  To appropriately check your blood pressure, make sure you do the following:  1) Avoid caffeine, exercise, or tobacco products for 30 minutes before checking. Empty your bladder. 2) Sit with your back supported in a flat-backed chair. Rest your arm on something flat (arm of the chair, table, etc). 3) Sit still with your feet flat on the floor, resting, for at least 5 minutes.  4) Check your blood pressure. Take 1-2 readings.  5) Write down these readings and bring with you to any provider appointments.  Bring your home blood pressure machine with you to a provider's office for accuracy comparison at least once a year.    Make sure you take your blood pressure medications before you come to any office visit, even if you were asked to fast for labs.   Take care!  Catie Eppie Gibson, PharmD, BCACP, CPP [facility] [phone]

## 2023-02-28 ENCOUNTER — Other Ambulatory Visit: Payer: Self-pay | Admitting: Nurse Practitioner

## 2023-02-28 NOTE — Telephone Encounter (Signed)
Please advise KH 

## 2023-03-07 ENCOUNTER — Other Ambulatory Visit: Payer: Medicare Other | Admitting: Pharmacist

## 2023-03-07 NOTE — Progress Notes (Signed)
Care Coordination Call  Contacted patient to follow up on medication changes made last week. She notes she has not picked up Ozempic yet. She did increase spironolactone. Denies lightheadedness, dizziness. Notes she has checked blood pressure and it's a "little better", but does not recall any numbers and declines to check her home cuff log.   Follow up for in office BP check and labs next week.   Catie Eppie Gibson, PharmD, BCACP, CPP [facility] [phone]

## 2023-03-12 ENCOUNTER — Ambulatory Visit (INDEPENDENT_AMBULATORY_CARE_PROVIDER_SITE_OTHER): Payer: Medicare Other

## 2023-03-12 ENCOUNTER — Other Ambulatory Visit: Payer: Self-pay

## 2023-03-12 VITALS — BP 132/68 | HR 84 | Temp 97.2°F | Ht 67.0 in | Wt 288.6 lb

## 2023-03-12 DIAGNOSIS — I10 Essential (primary) hypertension: Secondary | ICD-10-CM

## 2023-03-12 NOTE — Progress Notes (Signed)
Blood Pressure Recheck Visit  Name: Sharon Swanson Session MRN: [id number] Date of Birth: 04-11-1961  [name] presents today for Blood Pressure recheck with clinical support staff.  Order for BP recheck by Catie Clearance Coots , ordered on 03/07/23.   BP Readings from Last 3 Encounters:  02/26/23 (!) 171/67  01/18/23 (!) 165/86  12/13/22 (!) 159/73    Current Outpatient Medications  Medication Sig Dispense Refill   acetaminophen (TYLENOL) 325 MG tablet Take 2 tablets (650 mg total) by mouth every 6 (six) hours as needed.     albuterol (VENTOLIN HFA) 108 (90 Base) MCG/ACT inhaler Inhale 2 puffs into the lungs every 4 (four) hours as needed for wheezing or shortness of breath. 6.7 g 0   amLODipine (NORVASC) 10 MG tablet TAKE 1 TABLET(10 MG) BY MOUTH DAILY 90 tablet 1   aspirin EC 81 MG tablet Take 1 tablet (81 mg total) by mouth daily. Swallow whole. 90 tablet 3   atorvastatin (LIPITOR) 80 MG tablet Take 1 tablet (80 mg total) by mouth daily. 90 tablet 3   carvedilol (COREG) 25 MG tablet Take 1 tablet (25 mg total) by mouth 2 (two) times daily. 60 tablet 4   cetirizine (ZYRTEC) 10 MG tablet Take 1 tablet (10 mg total) by mouth in the morning. 90 tablet 3   Continuous Blood Gluc Receiver (FREESTYLE LIBRE 2 READER) DEVI Use as directed daily. 6 each 3   Continuous Blood Gluc Sensor (FREESTYLE LIBRE 2 SENSOR) MISC 1 application by Does not apply route every 14 (fourteen) days. 2 each 11   DULoxetine (CYMBALTA) 60 MG capsule TAKE 1 CAPSULE(60 MG) BY MOUTH DAILY 30 capsule 0   fluticasone (FLONASE) 50 MCG/ACT nasal spray SHAKE LIQUID AND USE 1 SPRAY IN EACH NOSTRIL DAILY 16 g 0   furosemide (LASIX) 40 MG tablet TAKE 1 TABLET(40 MG) BY MOUTH TWICE DAILY 180 tablet 1   gabapentin (NEURONTIN) 300 MG capsule TAKE 1 CAPSULE(300 MG) BY MOUTH THREE TIMES DAILY 90 capsule 0   insulin glargine (LANTUS) 100 UNIT/ML injection Inject 0.2 mLs (20 Units total) into the skin at bedtime. 15 mL 0   insulin lispro  (HUMALOG) 100 UNIT/ML injection Inject 0.1 mLs (10 Units total) into the skin 2 (two) times daily with a meal. 9 mL 3   ipratropium-albuterol (DUONEB) 0.5-2.5 (3) MG/3ML SOLN Take 3 mLs by nebulization every 6 (six) hours as needed (shortness of breath, wheeze). 360 mL 12   linaclotide (LINZESS) 145 MCG CAPS capsule Take 1 capsule (145 mcg total) by mouth daily before breakfast. 30 capsule 0   metFORMIN (GLUCOPHAGE) 1000 MG tablet Take 1 tablet (1,000 mg total) by mouth 2 (two) times daily with a meal. 180 tablet 1   ondansetron (ZOFRAN) 4 MG tablet Take 1 tablet (4 mg total) by mouth every 8 (eight) hours as needed for nausea or vomiting. 60 tablet 0   sacubitril-valsartan (ENTRESTO) 97-103 MG Take 1 tablet by mouth 2 (two) times daily. 60 tablet 4   Semaglutide,0.25 or 0.5MG /DOS, (OZEMPIC, 0.25 OR 0.5 MG/DOSE,) 2 MG/3ML SOPN Inject 0.5 mg into the skin once a week. 3 mL 2   spironolactone (ALDACTONE) 50 MG tablet Take 1 tablet (50 mg total) by mouth daily. 90 tablet 1   tiZANidine (ZANAFLEX) 4 MG tablet Take 1 tablet (4 mg total) by mouth every 6 (six) hours as needed for muscle spasms. 30 tablet 0   amoxicillin (AMOXIL) 875 MG tablet Take 875 mg by mouth 2 (two) times daily. For  dental procedure (Patient not taking: Reported on 01/23/2023)     traMADol (ULTRAM) 50 MG tablet Take 1 tablet (50 mg total) by mouth every 6 (six) hours as needed for moderate pain or severe pain. (Patient not taking: Reported on 01/23/2023) 120 tablet 2   Current Facility-Administered Medications  Medication Dose Route Frequency Provider Last Rate Last Admin   0.9 %  sodium chloride infusion  500 mL Intravenous Once Pyrtle, Carie Caddy, MD        Hypertensive Medication Review: Patient states that they are taking all their hypertensive medications as prescribed and their last dose of hypertensive medications was this morning   Documentation of any medication adherence discrepancies: none  Provider Recommendation:  Spoke  to [employer] and they stated: continue on current medication and follow up with scheduled appointment  Patient has been scheduled to follow up with Angus Seller, FNP 05/31/23  Patient has been given provider's recommendations and does not have any questions or concerns at this time. Patient will contact the office for any future questions or concerns.

## 2023-03-21 ENCOUNTER — Encounter (HOSPITAL_COMMUNITY): Payer: Self-pay | Admitting: Surgery

## 2023-03-27 ENCOUNTER — Other Ambulatory Visit: Payer: Self-pay | Admitting: Nurse Practitioner

## 2023-03-27 ENCOUNTER — Other Ambulatory Visit: Payer: Self-pay | Admitting: Physical Medicine and Rehabilitation

## 2023-03-27 DIAGNOSIS — E785 Hyperlipidemia, unspecified: Secondary | ICD-10-CM

## 2023-04-03 ENCOUNTER — Other Ambulatory Visit: Payer: Self-pay | Admitting: Nurse Practitioner

## 2023-05-01 ENCOUNTER — Other Ambulatory Visit: Payer: Self-pay | Admitting: Nurse Practitioner

## 2023-05-02 ENCOUNTER — Telehealth: Payer: Self-pay | Admitting: Nurse Practitioner

## 2023-05-02 NOTE — Telephone Encounter (Signed)
Someone from [facility] Called regarding paper work that was faxed. Fax was placed in provider forms box earlier today.

## 2023-05-02 NOTE — Telephone Encounter (Signed)
Form was in [place] folder and she is aware and will give to me after she signs. KH

## 2023-05-31 ENCOUNTER — Ambulatory Visit: Payer: Self-pay | Admitting: Nurse Practitioner

## 2023-06-13 ENCOUNTER — Ambulatory Visit: Payer: Self-pay | Admitting: Nurse Practitioner

## 2023-06-15 ENCOUNTER — Telehealth: Payer: Self-pay

## 2023-06-15 NOTE — Telephone Encounter (Signed)
Patient left message on 9/18 that she needed to reschedule the appointment on that date.

## 2023-06-21 ENCOUNTER — Other Ambulatory Visit: Payer: Self-pay | Admitting: Nurse Practitioner

## 2023-06-21 DIAGNOSIS — I5032 Chronic diastolic (congestive) heart failure: Secondary | ICD-10-CM

## 2023-06-21 DIAGNOSIS — R739 Hyperglycemia, unspecified: Secondary | ICD-10-CM

## 2023-06-21 DIAGNOSIS — R7309 Other abnormal glucose: Secondary | ICD-10-CM

## 2023-06-21 DIAGNOSIS — I10 Essential (primary) hypertension: Secondary | ICD-10-CM

## 2023-06-21 DIAGNOSIS — E1165 Type 2 diabetes mellitus with hyperglycemia: Secondary | ICD-10-CM

## 2023-06-22 ENCOUNTER — Other Ambulatory Visit: Payer: Self-pay

## 2023-06-22 DIAGNOSIS — R062 Wheezing: Secondary | ICD-10-CM

## 2023-06-22 DIAGNOSIS — J45909 Unspecified asthma, uncomplicated: Secondary | ICD-10-CM

## 2023-06-22 DIAGNOSIS — R0602 Shortness of breath: Secondary | ICD-10-CM

## 2023-06-22 MED ORDER — SPIRONOLACTONE 50 MG PO TABS: 50.0000 mg | ORAL_TABLET | Freq: Every day | ORAL | 1 refills | Status: AC

## 2023-06-22 MED ORDER — ALBUTEROL SULFATE HFA 108 (90 BASE) MCG/ACT IN AERS
2.0000 | INHALATION_SPRAY | RESPIRATORY_TRACT | 0 refills | Status: AC | PRN
Start: 2023-06-22 — End: ?

## 2023-06-22 NOTE — Telephone Encounter (Signed)
Please advise Kh 

## 2023-06-29 ENCOUNTER — Ambulatory Visit: Payer: Medicare Other | Admitting: Nurse Practitioner

## 2023-06-29 ENCOUNTER — Encounter: Payer: Self-pay | Admitting: Nurse Practitioner

## 2023-06-29 VITALS — BP 168/95 | HR 97 | Resp 16 | Ht 67.0 in | Wt 293.8 lb

## 2023-06-29 DIAGNOSIS — R7309 Other abnormal glucose: Secondary | ICD-10-CM

## 2023-06-29 DIAGNOSIS — Z794 Long term (current) use of insulin: Secondary | ICD-10-CM

## 2023-06-29 DIAGNOSIS — I10 Essential (primary) hypertension: Secondary | ICD-10-CM | POA: Diagnosis not present

## 2023-06-29 DIAGNOSIS — I5032 Chronic diastolic (congestive) heart failure: Secondary | ICD-10-CM | POA: Diagnosis not present

## 2023-06-29 DIAGNOSIS — R739 Hyperglycemia, unspecified: Secondary | ICD-10-CM

## 2023-06-29 DIAGNOSIS — E1165 Type 2 diabetes mellitus with hyperglycemia: Secondary | ICD-10-CM

## 2023-06-29 DIAGNOSIS — Z23 Encounter for immunization: Secondary | ICD-10-CM

## 2023-06-29 LAB — POCT GLYCOSYLATED HEMOGLOBIN (HGB A1C): Hemoglobin A1C: 9.9 % — AB (ref 4.0–5.6)

## 2023-06-29 LAB — POCT GLUCOSE (DEVICE FOR HOME USE): Glucose Fasting, POC: 266 mg/dL — AB (ref 70–99)

## 2023-06-29 MED ORDER — FREESTYLE LIBRE 2 SENSOR MISC: 1.0000 "application " | 11 refills | Status: AC

## 2023-06-29 MED ORDER — OZEMPIC (0.25 OR 0.5 MG/DOSE) 2 MG/3ML ~~LOC~~ SOPN: 0.5000 mg | PEN_INJECTOR | SUBCUTANEOUS | 2 refills | Status: AC

## 2023-06-29 MED ORDER — FREESTYLE LIBRE 2 SENSOR MISC
1.0000 | 11 refills | Status: DC
Start: 2023-06-29 — End: 2023-06-29

## 2023-06-29 MED ORDER — INSULIN GLARGINE 100 UNIT/ML ~~LOC~~ SOLN
20.0000 [IU] | Freq: Every day | SUBCUTANEOUS | 11 refills | Status: DC
Start: 2023-06-29 — End: 2023-07-27

## 2023-06-29 MED ORDER — INSULIN LISPRO 100 UNIT/ML IJ SOLN: 10.0000 [IU] | Freq: Two times a day (BID) | INTRAMUSCULAR | 3 refills | Status: AC

## 2023-06-29 MED ORDER — AMLODIPINE BESYLATE 10 MG PO TABS: ORAL_TABLET | ORAL | 3 refills | Status: AC

## 2023-06-29 MED ORDER — INSULIN LISPRO 100 UNIT/ML IJ SOLN
10.0000 [IU] | Freq: Two times a day (BID) | INTRAMUSCULAR | Status: DC
Start: 2023-06-29 — End: 2023-06-29

## 2023-06-29 MED ORDER — LINACLOTIDE 145 MCG PO CAPS: 145.0000 ug | ORAL_CAPSULE | Freq: Every day | ORAL | 11 refills | Status: AC

## 2023-06-29 MED ORDER — CARVEDILOL 25 MG PO TABS: 25.0000 mg | ORAL_TABLET | Freq: Two times a day (BID) | ORAL | 3 refills | Status: AC

## 2023-06-29 MED ORDER — ATORVASTATIN CALCIUM 80 MG PO TABS: 80.0000 mg | ORAL_TABLET | Freq: Every day | ORAL | 3 refills | Status: DC

## 2023-06-29 MED ORDER — METFORMIN HCL 1000 MG PO TABS: 1000.0000 mg | ORAL_TABLET | Freq: Two times a day (BID) | ORAL | 2 refills | Status: AC

## 2023-06-29 NOTE — Patient Instructions (Signed)
1. Type 2 diabetes mellitus with hyperglycemia, with long-term current use of insulin (HCC)  - POCT glycosylated hemoglobin (Hb A1C) - POCT Glucose (Device for Home Use) - Ambulatory referral to Endocrinology - AMB Referral to Pharmacy Medication Management - insulin glargine (LANTUS) 100 UNIT/ML injection; Inject 0.2 mLs (20 Units total) into the skin at bedtime.  Dispense: 10 mL; Refill: 11 - metFORMIN (GLUCOPHAGE) 1000 MG tablet; Take 1 tablet (1,000 mg total) by mouth 2 (two) times daily with a meal.  Dispense: 200 tablet; Refill: 2 - Continuous Glucose Sensor (FREESTYLE LIBRE 2 SENSOR) MISC; 1 application  by Does not apply route every 14 (fourteen) days.  Dispense: 2 each; Refill: 11 - insulin lispro (HUMALOG) 100 UNIT/ML injection; Inject 0.1 mLs (10 Units total) into the skin 2 (two) times daily with a meal.  Dispense: 10 mL; Refill: 3 - CBC - Comprehensive metabolic panel  2. Need for vaccination  - Flu vaccine trivalent PF, 6mos and older(Flulaval,Afluria,Fluarix,Fluzone)  3. Hyperglycemia  - insulin glargine (LANTUS) 100 UNIT/ML injection; Inject 0.2 mLs (20 Units total) into the skin at bedtime.  Dispense: 10 mL; Refill: 11 - insulin lispro (HUMALOG) 100 UNIT/ML injection; Inject 0.1 mLs (10 Units total) into the skin 2 (two) times daily with a meal.  Dispense: 10 mL; Refill: 3  4. Hemoglobin A1C greater than 9%, indicating poor diabetic control  - insulin glargine (LANTUS) 100 UNIT/ML injection; Inject 0.2 mLs (20 Units total) into the skin at bedtime.  Dispense: 10 mL; Refill: 11 - insulin lispro (HUMALOG) 100 UNIT/ML injection; Inject 0.1 mLs (10 Units total) into the skin 2 (two) times daily with a meal.  Dispense: 10 mL; Refill: 3  5. Chronic diastolic CHF (congestive heart failure) (HCC)  - amLODipine (NORVASC) 10 MG tablet; TAKE 1 TABLET BY MOUTH DAILY  Dispense: 90 tablet; Refill: 3 - carvedilol (COREG) 25 MG tablet; Take 1 tablet (25 mg total) by mouth 2 (two) times  daily.  Dispense: 180 tablet; Refill: 3  6. Essential hypertension  - amLODipine (NORVASC) 10 MG tablet; TAKE 1 TABLET BY MOUTH DAILY  Dispense: 90 tablet; Refill: 3 - carvedilol (COREG) 25 MG tablet; Take 1 tablet (25 mg total) by mouth 2 (two) times daily.  Dispense: 180 tablet; Refill: 3   Follow up:  Follow up in 3 months

## 2023-06-29 NOTE — Progress Notes (Signed)
Subjective   Patient ID: Sharon Swanson, female    DOB: 12/14/1960, 62 y.o.   MRN: [id number]  Chief Complaint  Patient presents with   Medical Management of Chronic Issues    Wants flu and pneumonia shot. Asking for refills on everything. Due for urine micro, foot exam and eye exam.     Referring provider: Ivonne Andrew, NP  Sharon Swanson is a 62 y.o. female with Past Medical History: 10/2020: Abnormal Pap smear of cervix No date: Acid reflux No date: Allergy No date: Asthma 10/2020: Atypical squamous cells of undetermined significance (ASC- [place]) on cervical Pap smear 01/2020: Bilateral lower extremity edema No date: COPD (chronic obstructive pulmonary disease) (HCC) No date: CPAP (continuous positive airway pressure) dependence No date: Diabetes (HCC) No date: Diabetes mellitus without complication (HCC)     Comment:  Type II 11/08/2011: Fatty liver 11/08/2011: Fibroids No date: Hyperglycemia 05/2020: Hyperlipidemia No date: Hypertension 01/2020: Microalbuminuria 11/08/2011: Obesity (BMI 30-39.9) No date: Osteoarthritis of right acromioclavicular joint 11/08/2011: Pancreatitis No date: Rotator cuff tear, right 01/2020: Shortness of breath on exertion No date: Sleep apnea No date: Ventral hernia 05/2020: Vitamin D deficiency   HPI  Hypertension: Patient here for follow-up of elevated blood pressure. She is exercising and is adherent to low salt diet.  Blood pressure is not well controlled at home.  Will place referral for pharmacy for hypertension medication management.  Cardiac symptoms fatigue. Patient denies chest pain, chest pressure/discomfort, and palpitations.  Cardiovascular risk factors: diabetes mellitus, hypertension, and obesity (BMI >= 30 kg/m2). Use of agents associated with hypertension: none. History of target organ damage: none.      Diabetes Mellitus: Patient presents for follow up of diabetes. Symptoms: none. . Patient denies foot ulcerations,  hypoglycemia , nausea, and paresthesia of the feet.  Evaluation to date has been included: hemoglobin A1C. Patient's blood sugar is low in office today. Treatment to date: no recent interventions. A1C in office today was 9.9.  Patient was previously followed by endocrinology for diabetic medication management.  We will refer her back today. Denies f/c/s, n/v/d, hemoptysis, PND, leg swelling. Denies chest pain or edema.     Denies f/c/s, n/v/d, hemoptysis, PND, leg swelling Denies chest pain or edema  Is followed by ophthalmology - Dr. London Sheer  Allergies  Allergen Reactions   Aspirin Other (See Comments)    Abdominal pain Abdominal pain Abdominal pain Abdominal pain Abdominal pain Abdominal pain Abdominal pain    Immunization History  Administered Date(s) Administered   DTaP 05/28/2020   Influenza,inj,Quad PF,6+ Mos 08/18/2014, 06/20/2016, 05/28/2020   Influenza-Unspecified 08/18/2014, 06/19/2016   Moderna Sars-Covid-2 Vaccination 12/12/2019, 01/13/2020   Pneumococcal Polysaccharide-23 08/18/2014, 04/25/2016, 06/28/2020   Pneumococcal-Unspecified 05/29/2016    Tobacco History: Social History   Tobacco Use  Smoking Status Every Day   Current packs/day: 0.10   Average packs/day: 0.1 packs/day for 42.8 years (4.3 ttl pk-yrs)   Types: Cigarettes   Start date: 09/25/1980  Smokeless Tobacco Never  Tobacco Comments   pt states she smoke 2 cigerettes a day. States she uses nicotine patches.   Ready to quit: Not Answered Counseling given: Not Answered Tobacco comments: pt states she smoke 2 cigerettes a day. States she uses nicotine patches.   Outpatient Encounter Medications as of 06/29/2023  Medication Sig   acetaminophen (TYLENOL) 325 MG tablet Take 2 tablets (650 mg total) by mouth every 6 (six) hours as needed.   albuterol (VENTOLIN HFA) 108 (90 Base) MCG/ACT inhaler Inhale 2 puffs  into the lungs every 4 (four) hours as needed for wheezing or shortness of breath.    aspirin EC 81 MG tablet Take 1 tablet (81 mg total) by mouth daily. Swallow whole.   cetirizine (ZYRTEC) 10 MG tablet Take 1 tablet (10 mg total) by mouth in the morning.   Continuous Blood Gluc Receiver (FREESTYLE LIBRE 2 READER) DEVI Use as directed daily.   DULoxetine (CYMBALTA) 60 MG capsule TAKE 1 CAPSULE BY MOUTH DAILY   fluticasone (FLONASE) 50 MCG/ACT nasal spray SHAKE LIQUID AND USE 1 SPRAY IN EACH NOSTRIL DAILY   furosemide (LASIX) 40 MG tablet TAKE 1 TABLET BY MOUTH TWICE  DAILY   gabapentin (NEURONTIN) 300 MG capsule TAKE 1 CAPSULE BY MOUTH 3 TIMES  DAILY   ipratropium-albuterol (DUONEB) 0.5-2.5 (3) MG/3ML SOLN Take 3 mLs by nebulization every 6 (six) hours as needed (shortness of breath, wheeze).   ondansetron (ZOFRAN) 4 MG tablet Take 1 tablet (4 mg total) by mouth every 8 (eight) hours as needed for nausea or vomiting.   sacubitril-valsartan (ENTRESTO) 97-103 MG Take 1 tablet by mouth 2 (two) times daily.   spironolactone (ALDACTONE) 50 MG tablet Take 1 tablet (50 mg total) by mouth daily.   tiZANidine (ZANAFLEX) 4 MG tablet Take 1 tablet (4 mg total) by mouth every 6 (six) hours as needed for muscle spasms.   [DISCONTINUED] amLODipine (NORVASC) 10 MG tablet TAKE 1 TABLET BY MOUTH DAILY   [DISCONTINUED] atorvastatin (LIPITOR) 80 MG tablet Take 1 tablet (80 mg total) by mouth daily.   [DISCONTINUED] carvedilol (COREG) 25 MG tablet TAKE 1 TABLET BY MOUTH TWICE  DAILY   [DISCONTINUED] Continuous Blood Gluc Sensor (FREESTYLE LIBRE 2 SENSOR) MISC 1 application by Does not apply route every 14 (fourteen) days.   [DISCONTINUED] insulin lispro (HUMALOG) 100 UNIT/ML injection Inject 0.1 mLs (10 Units total) into the skin 2 (two) times daily with a meal.   [DISCONTINUED] LANTUS 100 UNIT/ML injection INJECT SUBCUTANEOUSLY 20 UNITS  AT BEDTIME (VIAL ONLY LASTS 28  DAYS AFTER FIRST USE )   [DISCONTINUED] LINZESS 145 MCG CAPS capsule TAKE 1 CAPSULE BY MOUTH DAILY  BEFORE BREAKFAST    [DISCONTINUED] metFORMIN (GLUCOPHAGE) 1000 MG tablet TAKE 1 TABLET BY MOUTH TWICE  DAILY WITH A MEAL   [DISCONTINUED] Semaglutide,0.25 or 0.5MG /DOS, (OZEMPIC, 0.25 OR 0.5 MG/DOSE,) 2 MG/3ML SOPN Inject 0.5 mg into the skin once a week.   amLODipine (NORVASC) 10 MG tablet TAKE 1 TABLET BY MOUTH DAILY   atorvastatin (LIPITOR) 80 MG tablet Take 1 tablet (80 mg total) by mouth daily.   carvedilol (COREG) 25 MG tablet Take 1 tablet (25 mg total) by mouth 2 (two) times daily.   Continuous Glucose Sensor (FREESTYLE LIBRE 2 SENSOR) MISC 1 application  by Does not apply route every 14 (fourteen) days.   insulin glargine (LANTUS) 100 UNIT/ML injection Inject 0.2 mLs (20 Units total) into the skin at bedtime.   insulin lispro (HUMALOG) 100 UNIT/ML injection Inject 0.1 mLs (10 Units total) into the skin 2 (two) times daily with a meal.   linaclotide (LINZESS) 145 MCG CAPS capsule Take 1 capsule (145 mcg total) by mouth daily before breakfast.   metFORMIN (GLUCOPHAGE) 1000 MG tablet Take 1 tablet (1,000 mg total) by mouth 2 (two) times daily with a meal.   Semaglutide,0.25 or 0.5MG /DOS, (OZEMPIC, 0.25 OR 0.5 MG/DOSE,) 2 MG/3ML SOPN Inject 0.5 mg into the skin once a week.   traMADol (ULTRAM) 50 MG tablet Take 1 tablet (50 mg total)  by mouth every 6 (six) hours as needed for moderate pain or severe pain. (Patient not taking: Reported on 01/23/2023)   [DISCONTINUED] amoxicillin (AMOXIL) 875 MG tablet Take 875 mg by mouth 2 (two) times daily. For dental procedure (Patient not taking: Reported on 01/23/2023)   [DISCONTINUED] Continuous Glucose Sensor (FREESTYLE LIBRE 2 SENSOR) MISC 1 application  by Does not apply route every 14 (fourteen) days.   [DISCONTINUED] Continuous Glucose Sensor (FREESTYLE LIBRE 2 SENSOR) MISC 1 application  by Does not apply route every 14 (fourteen) days.   [DISCONTINUED] insulin lispro (HUMALOG) 100 UNIT/ML injection Inject 0.1 mLs (10 Units total) into the skin 2 (two) times daily with a  meal.   Facility-Administered Encounter Medications as of 06/29/2023  Medication   0.9 %  sodium chloride infusion    Review of Systems  Review of Systems  Constitutional: Negative.   HENT: Negative.    Cardiovascular: Negative.   Gastrointestinal: Negative.   Allergic/Immunologic: Negative.   Neurological: Negative.   Psychiatric/Behavioral: Negative.       Objective:   BP (!) 169/82   Pulse 97   Resp 16   Ht 5\' 7"  (1.702 m)   Wt 293 lb 12.8 oz (133.3 kg)   SpO2 99%   BMI 46.02 kg/m   Wt Readings from Last 5 Encounters:  06/29/23 293 lb 12.8 oz (133.3 kg)  03/12/23 288 lb 9.6 oz (130.9 kg)  02/26/23 292 lb 12.8 oz (132.8 kg)  01/18/23 296 lb 12.8 oz (134.6 kg)  12/13/22 298 lb (135.2 kg)     Physical Exam Vitals and nursing note reviewed.  Constitutional:      General: She is not in acute distress.    Appearance: She is well-developed.  Cardiovascular:     Rate and Rhythm: Normal rate and regular rhythm.  Pulmonary:     Effort: Pulmonary effort is normal.     Breath sounds: Normal breath sounds.  Neurological:     Mental Status: She is alert and oriented to person, place, and time.       Assessment & Plan:   Need for vaccination -     Flu vaccine trivalent PF, 6mos and older(Flulaval,Afluria,Fluarix,Fluzone)  Type 2 diabetes mellitus with hyperglycemia, with long-term current use of insulin (HCC) -     POCT glycosylated hemoglobin (Hb A1C) -     POCT Glucose (Device for Home Use) -     Ambulatory referral to Endocrinology -     AMB Referral to Pharmacy Medication Management -     Insulin Glargine; Inject 0.2 mLs (20 Units total) into the skin at bedtime.  Dispense: 10 mL; Refill: 11 -     metFORMIN HCl; Take 1 tablet (1,000 mg total) by mouth 2 (two) times daily with a meal.  Dispense: 200 tablet; Refill: 2 -     FreeStyle Libre 2 Sensor; 1 application  by Does not apply route every 14 (fourteen) days.  Dispense: 2 each; Refill: 11 -     Insulin  Lispro; Inject 0.1 mLs (10 Units total) into the skin 2 (two) times daily with a meal.  Dispense: 10 mL; Refill: 3 -     CBC -     Comprehensive metabolic panel  Hyperglycemia -     Insulin Glargine; Inject 0.2 mLs (20 Units total) into the skin at bedtime.  Dispense: 10 mL; Refill: 11 -     Insulin Lispro; Inject 0.1 mLs (10 Units total) into the skin 2 (two) times daily with  a meal.  Dispense: 10 mL; Refill: 3  Hemoglobin A1C greater than 9%, indicating poor diabetic control -     Insulin Glargine; Inject 0.2 mLs (20 Units total) into the skin at bedtime.  Dispense: 10 mL; Refill: 11 -     Insulin Lispro; Inject 0.1 mLs (10 Units total) into the skin 2 (two) times daily with a meal.  Dispense: 10 mL; Refill: 3  Chronic diastolic CHF (congestive heart failure) (HCC) -     amLODIPine Besylate; TAKE 1 TABLET BY MOUTH DAILY  Dispense: 90 tablet; Refill: 3 -     Carvedilol; Take 1 tablet (25 mg total) by mouth 2 (two) times daily.  Dispense: 180 tablet; Refill: 3  Essential hypertension -     amLODIPine Besylate; TAKE 1 TABLET BY MOUTH DAILY  Dispense: 90 tablet; Refill: 3 -     Carvedilol; Take 1 tablet (25 mg total) by mouth 2 (two) times daily.  Dispense: 180 tablet; Refill: 3  Other orders -     Ozempic (0.25 or 0.5 MG/DOSE); Inject 0.5 mg into the skin once a week.  Dispense: 3 mL; Refill: 2 -     Atorvastatin Calcium; Take 1 tablet (80 mg total) by mouth daily.  Dispense: 90 tablet; Refill: 3 -     linaCLOtide; Take 1 capsule (145 mcg total) by mouth daily before breakfast.  Dispense: 30 capsule; Refill: 11     Return in about 3 months (around 09/29/2023).   Ivonne Andrew, NP 06/29/2023

## 2023-06-30 LAB — COMPREHENSIVE METABOLIC PANEL
ALT: 30 [IU]/L (ref 0–32)
AST: 32 [IU]/L (ref 0–40)
Albumin: 4.3 g/dL (ref 3.9–4.9)
Alkaline Phosphatase: 89 [IU]/L (ref 44–121)
BUN/Creatinine Ratio: 15 (ref 12–28)
BUN: 10 mg/dL (ref 8–27)
Bilirubin Total: 0.2 mg/dL (ref 0.0–1.2)
CO2: 18 mmol/L — ABNORMAL LOW (ref 20–29)
Calcium: 9.4 mg/dL (ref 8.7–10.3)
Chloride: 101 mmol/L (ref 96–106)
Creatinine, Ser: 0.66 mg/dL (ref 0.57–1.00)
Globulin, Total: 3.1 g/dL (ref 1.5–4.5)
Glucose: 275 mg/dL — ABNORMAL HIGH (ref 70–99)
Potassium: 4.3 mmol/L (ref 3.5–5.2)
Sodium: 140 mmol/L (ref 134–144)
Total Protein: 7.4 g/dL (ref 6.0–8.5)
eGFR: 99 mL/min/{1.73_m2} (ref >59)

## 2023-06-30 LAB — CBC
Hematocrit: 51.6 % — ABNORMAL HIGH (ref 34.0–46.6)
Hemoglobin: 16.5 g/dL — ABNORMAL HIGH (ref 11.1–15.9)
MCH: 29 pg (ref 26.6–33.0)
MCHC: 32 g/dL (ref 31.5–35.7)
MCV: 91 fL (ref 79–97)
Platelets: 162 10*3/uL (ref 150–450)
RBC: 5.68 x10E6/uL — ABNORMAL HIGH (ref 3.77–5.28)
RDW: 13.2 % (ref 11.7–15.4)
WBC: 4 10*3/uL (ref 3.4–10.8)

## 2023-07-03 ENCOUNTER — Other Ambulatory Visit: Payer: Self-pay

## 2023-07-03 DIAGNOSIS — J45909 Unspecified asthma, uncomplicated: Secondary | ICD-10-CM

## 2023-07-03 DIAGNOSIS — R062 Wheezing: Secondary | ICD-10-CM

## 2023-07-03 DIAGNOSIS — R0602 Shortness of breath: Secondary | ICD-10-CM

## 2023-07-03 NOTE — Telephone Encounter (Signed)
Called pt and she advise that will not be using [facility] for her mediations. Pt also advised that all her med have been filled at [facility].  KH

## 2023-07-04 ENCOUNTER — Telehealth: Payer: Self-pay

## 2023-07-04 NOTE — Progress Notes (Signed)
Care Guide Note  07/04/2023 Name: Raesha Borin MRN: [id number] DOB: 1961/09/15  Referred by: Ivonne Andrew, NP Reason for referral : Care Coordination (Outreach to schedule with Pharm d )   Jocabed Boston is a 62 y.o. year old female who is a primary care patient of Ivonne Andrew, NP. Andromeda Gren was referred to the pharmacist for assistance related to HTN and DM.    Successful contact was made with the patient to discuss pharmacy services including being ready for the pharmacist to call at least 5 minutes before the scheduled appointment time, to have medication bottles and any blood sugar or blood pressure readings ready for review. The patient agreed to meet with the pharmacist via with the pharmacist via telephone visit on (date/time).  07/26/2023  Penne Lash, RMA Care Guide [facility]  [place], [place] [address] Direct Dial: [phone] [email or web address]

## 2023-07-06 ENCOUNTER — Other Ambulatory Visit: Payer: Self-pay

## 2023-07-10 ENCOUNTER — Other Ambulatory Visit (INDEPENDENT_AMBULATORY_CARE_PROVIDER_SITE_OTHER): Payer: Medicare Other

## 2023-07-10 ENCOUNTER — Other Ambulatory Visit: Payer: Self-pay

## 2023-07-10 DIAGNOSIS — E1165 Type 2 diabetes mellitus with hyperglycemia: Secondary | ICD-10-CM

## 2023-07-10 LAB — COMPREHENSIVE METABOLIC PANEL
ALT: 22 U/L (ref 0–35)
AST: 24 U/L (ref 0–37)
Albumin: 4.1 g/dL (ref 3.5–5.2)
Alkaline Phosphatase: 73 U/L (ref 39–117)
BUN: 10 mg/dL (ref 6–23)
CO2: 29 meq/L (ref 19–32)
Calcium: 9.5 mg/dL (ref 8.4–10.5)
Chloride: 99 meq/L (ref 96–112)
Creatinine, Ser: 0.68 mg/dL (ref 0.40–1.20)
GFR: 93.33 mL/min (ref >60.00)
Glucose, Bld: 217 mg/dL — ABNORMAL HIGH (ref 70–99)
Potassium: 3.7 meq/L (ref 3.5–5.1)
Sodium: 140 meq/L (ref 135–145)
Total Bilirubin: 0.5 mg/dL (ref 0.2–1.2)
Total Protein: 7.2 g/dL (ref 6.0–8.3)

## 2023-07-10 LAB — HEMOGLOBIN A1C: Hgb A1c MFr Bld: 9.8 % — ABNORMAL HIGH (ref 4.6–6.5)

## 2023-07-10 LAB — LIPID PANEL
Cholesterol: 227 mg/dL — ABNORMAL HIGH (ref 0–200)
HDL: 82.8 mg/dL (ref >39.00)
LDL Cholesterol: 116 mg/dL — ABNORMAL HIGH (ref 0–99)
NonHDL: 143.89
Total CHOL/HDL Ratio: 3
Triglycerides: 137 mg/dL (ref 0.0–149.0)
VLDL: 27.4 mg/dL (ref 0.0–40.0)

## 2023-07-10 LAB — MICROALBUMIN / CREATININE URINE RATIO
Creatinine,U: 72.8 mg/dL
Microalb Creat Ratio: 133.3 mg/g — ABNORMAL HIGH (ref 0.0–30.0)
Microalb, Ur: 97 mg/dL — ABNORMAL HIGH (ref 0.0–1.9)

## 2023-07-13 ENCOUNTER — Encounter: Payer: Self-pay | Admitting: "Endocrinology

## 2023-07-13 ENCOUNTER — Ambulatory Visit (INDEPENDENT_AMBULATORY_CARE_PROVIDER_SITE_OTHER): Payer: Medicare Other | Admitting: "Endocrinology

## 2023-07-13 VITALS — BP 134/74 | HR 92 | Ht 67.0 in | Wt 291.8 lb

## 2023-07-13 DIAGNOSIS — Z7985 Long-term (current) use of injectable non-insulin antidiabetic drugs: Secondary | ICD-10-CM

## 2023-07-13 DIAGNOSIS — E1165 Type 2 diabetes mellitus with hyperglycemia: Secondary | ICD-10-CM

## 2023-07-13 DIAGNOSIS — Z794 Long term (current) use of insulin: Secondary | ICD-10-CM | POA: Diagnosis not present

## 2023-07-13 DIAGNOSIS — E7800 Pure hypercholesterolemia, unspecified: Secondary | ICD-10-CM

## 2023-07-13 DIAGNOSIS — Z7984 Long term (current) use of oral hypoglycemic drugs: Secondary | ICD-10-CM | POA: Diagnosis not present

## 2023-07-13 NOTE — Progress Notes (Signed)
Outpatient Endocrinology Note Sharon Moore, MD  07/13/23   Sharon Swanson 26-Dec-1960 [id number]  Referring Provider: Ivonne Andrew, NP Primary Care Provider: Ivonne Andrew, NP Reason for consultation: Subjective   Assessment & Plan  Diagnoses and all orders for this visit:  Uncontrolled type 2 diabetes mellitus with hyperglycemia (HCC) -     Ambulatory referral to diabetic education  Long term (current) use of oral hypoglycemic drugs  Long-term (current) use of injectable non-insulin antidiabetic drugs  Long-term insulin use (HCC)  Pure hypercholesterolemia    Diabetes Type II complicated by neuropathy,  Lab Results  Component Value Date   GFR 93.33 07/10/2023   Hba1c goal less than 7, current Hba1c is  Lab Results  Component Value Date   HGBA1C 9.8 (H) 07/10/2023   Will recommend the following: Continue Metformin 1000 mg bid Continue  Ozempic 0.5 mg week Start Lantus 30 units at bedtime now Humalog 14 units bid 15 min before meals after 2 days   No known contraindications/side effects to any of above medications  -Last LD and Tg are as follows: Lab Results  Component Value Date   LDLCALC 116 (H) 07/10/2023    Lab Results  Component Value Date   TRIG 137.0 07/10/2023   -On atorvastatin 80 mg QD -Follow low fat diet and exercise   -Blood pressure goal <140/90 - Microalbumin/creatinine goal is < 30 -Last MA/Cr is as follows: Lab Results  Component Value Date   MICROALBUR 97.0 (H) 07/10/2023   -on ACE/ARB estresto- -diet changes including salt restriction -limit eating outside -counseled BP targets per standards of diabetes care -uncontrolled blood pressure can lead to retinopathy, nephropathy and cardiovascular and atherosclerotic heart disease  Reviewed and counseled on: -A1C target -Blood sugar targets -Complications of uncontrolled diabetes  -Checking blood sugar before meals and bedtime and bring log next visit -All medications  with mechanism of action and side effects -Hypoglycemia management: rule of 15's, Glucagon Emergency Kit and medical alert ID -low-carb low-fat plate-method diet -At least 20 minutes of physical activity per day -Annual dilated retinal eye exam and foot exam -compliance and follow up needs -follow up as scheduled or earlier if problem gets worse  Call if blood sugar is less than 70 or consistently above 250    Take a 15 gm snack of carbohydrate at bedtime before you go to sleep if your blood sugar is less than 100.    If you are going to fast after midnight for a test or procedure, ask your physician for instructions on how to reduce/decrease your insulin dose.    Call if blood sugar is less than 70 or consistently above 250  -Treating a low sugar by rule of 15  (15 gms of sugar every 15 min until sugar is more than 70) If you feel your sugar is low, test your sugar to be sure If your sugar is low (less than 70), then take 15 grams of a fast acting Carbohydrate (3-4 glucose tablets or glucose gel or 4 ounces of juice or regular soda) Recheck your sugar 15 min after treating low to make sure it is more than 70 If sugar is still less than 70, treat again with 15 grams of carbohydrate          Don't drive the hour of hypoglycemia  If unconscious/unable to eat or drink by mouth, use glucagon injection or nasal spray baqsimi and call 911. Can repeat again in 15 min if still unconscious.  Return  in about 3 weeks (around 08/03/2023).   I have reviewed current medications, nurse's notes, allergies, vital signs, past medical and surgical history, family medical history, and social history for this encounter. Counseled patient on symptoms, examination findings, lab findings, imaging results, treatment decisions and monitoring and prognosis. The patient understood the recommendations and agrees with the treatment plan. All questions regarding treatment plan were fully answered.  Sharon Reynolds, MD   07/13/23    History of Present Illness Sharon Swanson is a 62 y.o. year old female who presents for evaluation of Type II diabetes mellitus.  Arya Spare was first diagnosed around 2019.   Diabetes education -  Home diabetes regimen: Metformin 1000 mg bid Ozempic 0.5 mg week Lantus 20 units at bedtime Humalog 10 units bid 15 min before meals  COMPLICATIONS -  MI/Stroke -  retinopathy +  neuropathy +  nephropathy/microalbuminuria  SYMPTOMS REVIEWED + Polyuria + Weight loss - Blurred vision  BLOOD SUGAR DATA  CGM interpretation: At today's visit, we reviewed her CGM downloads. The full report is scanned in the media. Reviewing the CGM trends, BG are elevated across the day.   Physical Exam  BP 134/74   Pulse 92   Ht 5\' 7"  (1.702 m)   Wt 291 lb 12.8 oz (132.4 kg)   SpO2 94%   BMI 45.70 kg/m    Constitutional: well developed, well nourished Head: normocephalic, atraumatic Eyes: sclera anicteric, no redness Neck: supple Lungs: normal respiratory effort Neurology: alert and oriented Skin: dry, no appreciable rashes Musculoskeletal: no appreciable defects Psychiatric: normal mood and affect Diabetic Foot Exam - Simple   No data filed      Current Medications Patient's Medications  New Prescriptions   No medications on file  Previous Medications   ACETAMINOPHEN (TYLENOL) 325 MG TABLET    Take 2 tablets (650 mg total) by mouth every 6 (six) hours as needed.   ALBUTEROL (VENTOLIN HFA) 108 (90 BASE) MCG/ACT INHALER    Inhale 2 puffs into the lungs every 4 (four) hours as needed for wheezing or shortness of breath.   AMLODIPINE (NORVASC) 10 MG TABLET    TAKE 1 TABLET BY MOUTH DAILY   ASPIRIN EC 81 MG TABLET    Take 1 tablet (81 mg total) by mouth daily. Swallow whole.   ATORVASTATIN (LIPITOR) 80 MG TABLET    Take 1 tablet (80 mg total) by mouth daily.   CARVEDILOL (COREG) 25 MG TABLET    Take 1 tablet (25 mg total) by mouth 2 (two) times daily.   CETIRIZINE  (ZYRTEC) 10 MG TABLET    Take 1 tablet (10 mg total) by mouth in the morning.   CONTINUOUS BLOOD GLUC RECEIVER (FREESTYLE LIBRE 2 READER) DEVI    Use as directed daily.   CONTINUOUS GLUCOSE SENSOR (FREESTYLE LIBRE 2 SENSOR) MISC    1 application  by Does not apply route every 14 (fourteen) days.   DULOXETINE (CYMBALTA) 60 MG CAPSULE    TAKE 1 CAPSULE BY MOUTH DAILY   FLUTICASONE (FLONASE) 50 MCG/ACT NASAL SPRAY    SHAKE LIQUID AND USE 1 SPRAY IN EACH NOSTRIL DAILY   FUROSEMIDE (LASIX) 40 MG TABLET    TAKE 1 TABLET BY MOUTH TWICE  DAILY   GABAPENTIN (NEURONTIN) 300 MG CAPSULE    TAKE 1 CAPSULE BY MOUTH 3 TIMES  DAILY   INSULIN GLARGINE (LANTUS) 100 UNIT/ML INJECTION    Inject 0.2 mLs (20 Units total) into the skin at bedtime.   INSULIN LISPRO (  HUMALOG) 100 UNIT/ML INJECTION    Inject 0.1 mLs (10 Units total) into the skin 2 (two) times daily with a meal.   IPRATROPIUM-ALBUTEROL (DUONEB) 0.5-2.5 (3) MG/3ML SOLN    Take 3 mLs by nebulization every 6 (six) hours as needed (shortness of breath, wheeze).   LINACLOTIDE (LINZESS) 145 MCG CAPS CAPSULE    Take 1 capsule (145 mcg total) by mouth daily before breakfast.   METFORMIN (GLUCOPHAGE) 1000 MG TABLET    Take 1 tablet (1,000 mg total) by mouth 2 (two) times daily with a meal.   ONDANSETRON (ZOFRAN) 4 MG TABLET    Take 1 tablet (4 mg total) by mouth every 8 (eight) hours as needed for nausea or vomiting.   SACUBITRIL-VALSARTAN (ENTRESTO) 97-103 MG    Take 1 tablet by mouth 2 (two) times daily.   SEMAGLUTIDE,0.25 OR 0.5MG /DOS, (OZEMPIC, 0.25 OR 0.5 MG/DOSE,) 2 MG/3ML SOPN    Inject 0.5 mg into the skin once a week.   SPIRONOLACTONE (ALDACTONE) 50 MG TABLET    Take 1 tablet (50 mg total) by mouth daily.   TIZANIDINE (ZANAFLEX) 4 MG TABLET    Take 1 tablet (4 mg total) by mouth every 6 (six) hours as needed for muscle spasms.   TRAMADOL (ULTRAM) 50 MG TABLET    Take 1 tablet (50 mg total) by mouth every 6 (six) hours as needed for moderate pain or severe  pain.  Modified Medications   No medications on file  Discontinued Medications   No medications on file    Allergies Allergies  Allergen Reactions   Aspirin Other (See Comments)    Abdominal pain Abdominal pain Abdominal pain Abdominal pain Abdominal pain Abdominal pain Abdominal pain    Past Medical History Past Medical History:  Diagnosis Date   Abnormal Pap smear of cervix 10/2020   Acid reflux    Allergy    Asthma    Atypical squamous cells of undetermined significance (ASC-US) on cervical Pap smear 10/2020   Bilateral lower extremity edema 01/2020   COPD (chronic obstructive pulmonary disease) (HCC)    CPAP (continuous positive airway pressure) dependence    Diabetes (HCC)    Diabetes mellitus without complication (HCC)    Type II   Fatty liver 11/08/2011   Fibroids 11/08/2011   Hyperglycemia    Hyperlipidemia 05/2020   Hypertension    Microalbuminuria 01/2020   Obesity (BMI 30-39.9) 11/08/2011   Osteoarthritis of right acromioclavicular joint    Pancreatitis 11/08/2011   Rotator cuff tear, right    Shortness of breath on exertion 01/2020   Sleep apnea    Ventral hernia    Vitamin D deficiency 05/2020    Past Surgical History Past Surgical History:  Procedure Laterality Date   COLONOSCOPY     ORTHOPEDIC SURGERY     R ankle, tendonitis   UMBILICAL HERNIA REPAIR N/A 09/30/2021   Procedure: INCARCERATED HERNIA REPAIR UMBILICAL ADULT;  Surgeon: Fritzi Mandes, MD;  Location: [facility];  Service: General;  Laterality: N/A;    Family History family history includes Diabetes in her maternal grandmother and another family member; Hypertension in her maternal grandmother and mother.  Social History Social History   Socioeconomic History   Marital status: Married    Spouse name: Not on file   Number of children: Not on file   Years of education: Not on file   Highest education level: Not on file  Occupational History   Not on file  Tobacco Use  Smoking status: Every Day    Current packs/day: 0.10    Average packs/day: 0.1 packs/day for 42.8 years (4.3 ttl pk-yrs)    Types: Cigarettes    Start date: 09/25/1980   Smokeless tobacco: Never   Tobacco comments:    pt states she smoke 2 cigerettes a day. States she uses nicotine patches.  Vaping Use   Vaping status: Never Used  Substance and Sexual Activity   Alcohol use: No   Drug use: No   Sexual activity: Not Currently    Partners: Male    Birth control/protection: Post-menopausal  Other Topics Concern   Not on file  Social History Narrative   Not on file   Social Determinants of Health   Financial Resource Strain: Medium Risk (06/08/2021)   Overall Financial Resource Strain (CARDIA)    Difficulty of Paying Living Expenses: Somewhat hard  Food Insecurity: No Food Insecurity (06/08/2021)   Hunger Vital Sign    Worried About Running Out of Food in the Last Year: Never true    Ran Out of Food in the Last Year: Never true  Transportation Needs: No Transportation Needs (06/08/2021)   PRAPARE - Administrator, Civil Service (Medical): No    Lack of Transportation (Non-Medical): No  Physical Activity: Inactive (06/08/2021)   Exercise Vital Sign    Days of Exercise per Week: 0 days    Minutes of Exercise per Session: 0 min  Stress: Stress Concern Present (06/08/2021)   [employer] of Occupational Health - Occupational Stress Questionnaire    Feeling of Stress : To some extent  Social Connections: Socially Integrated (06/08/2021)   Social Connection and Isolation Panel [NHANES]    Frequency of Communication with Friends and Family: Three times a week    Frequency of Social Gatherings with Friends and Family: Three times a week    Attends Religious Services: More than 4 times per year    Active Member of Clubs or Organizations: Yes    Attends Banker Meetings: 1 to 4 times per year    Marital Status: Married  Catering manager Violence: Not At Risk  (06/08/2021)   Humiliation, Afraid, Rape, and Kick questionnaire    Fear of Current or Ex-Partner: No    Emotionally Abused: No    Physically Abused: No    Sexually Abused: No    Lab Results  Component Value Date   HGBA1C 9.8 (H) 07/10/2023   HGBA1C 9.9 (A) 06/29/2023   HGBA1C 8.9 (A) 02/26/2023   Lab Results  Component Value Date   CHOL 227 (H) 07/10/2023   Lab Results  Component Value Date   HDL 82.80 07/10/2023   Lab Results  Component Value Date   LDLCALC 116 (H) 07/10/2023   Lab Results  Component Value Date   TRIG 137.0 07/10/2023   Lab Results  Component Value Date   CHOLHDL 3 07/10/2023   Lab Results  Component Value Date   CREATININE 0.68 07/10/2023   Lab Results  Component Value Date   GFR 93.33 07/10/2023   Lab Results  Component Value Date   MICROALBUR 97.0 (H) 07/10/2023      Component Value Date/Time   NA 140 07/10/2023 0841   NA 140 06/29/2023 0853   K 3.7 07/10/2023 0841   CL 99 07/10/2023 0841   CO2 29 07/10/2023 0841   GLUCOSE 217 (H) 07/10/2023 0841   BUN 10 07/10/2023 0841   BUN 10 06/29/2023 0853   CREATININE 0.68 07/10/2023 0841  CALCIUM 9.5 07/10/2023 0841   PROT 7.2 07/10/2023 0841   PROT 7.4 06/29/2023 0853   ALBUMIN 4.1 07/10/2023 0841   ALBUMIN 4.3 06/29/2023 0853   AST 24 07/10/2023 0841   ALT 22 07/10/2023 0841   ALKPHOS 73 07/10/2023 0841   BILITOT 0.5 07/10/2023 0841   BILITOT 0.2 06/29/2023 0853   GFRNONAA >60 10/06/2021 0359   GFRAA 111 10/04/2020 1019      Latest Ref Rng & Units 07/10/2023    8:41 AM 06/29/2023    8:53 AM 11/27/2022    9:14 AM  BMP  Glucose 70 - 99 mg/dL 161  096  045   BUN 6 - 23 mg/dL 10  10  12    Creatinine 0.40 - 1.20 mg/dL 4.09  8.11  9.14   BUN/Creat Ratio 12 - 28  15  16    Sodium 135 - 145 mEq/L 140  140  139   Potassium 3.5 - 5.1 mEq/L 3.7  4.3  4.7   Chloride 96 - 112 mEq/L 99  101  101   CO2 19 - 32 mEq/L 29  18  25    Calcium 8.4 - 10.5 mg/dL 9.5  9.4  9.8        Component  Value Date/Time   WBC 4.0 06/29/2023 0853   WBC 3.5 (L) 10/06/2021 0359   RBC 5.68 (H) 06/29/2023 0853   RBC 4.01 10/06/2021 0359   HGB 16.5 (H) 06/29/2023 0853   HCT 51.6 (H) 06/29/2023 0853   PLT 162 06/29/2023 0853   MCV 91 06/29/2023 0853   MCH 29.0 06/29/2023 0853   MCH 28.9 10/06/2021 0359   MCHC 32.0 06/29/2023 0853   MCHC 30.9 10/06/2021 0359   RDW 13.2 06/29/2023 0853   LYMPHSABS 0.9 10/06/2021 0359   LYMPHSABS 1.0 10/04/2020 1019   MONOABS 0.6 10/06/2021 0359   EOSABS 0.2 10/06/2021 0359   EOSABS 0.1 10/04/2020 1019   BASOSABS 0.0 10/06/2021 0359   BASOSABS 0.0 10/04/2020 1019     Parts of this note may have been dictated using voice recognition software. There may be variances in spelling and vocabulary which are unintentional. Not all errors are proofread. Please notify the Thereasa Parkin if any discrepancies are noted or if the meaning of any statement is not clear.

## 2023-07-13 NOTE — Patient Instructions (Addendum)
Continue Metformin 1000 mg twice a day Continue  Ozempic 0.5 mg week Start Lantus 30 units at bedtime now Humalog 14 units bid 15 min before meals after 2 days

## 2023-07-20 ENCOUNTER — Ambulatory Visit: Payer: Medicare Other | Admitting: "Endocrinology

## 2023-07-25 ENCOUNTER — Ambulatory Visit: Payer: Medicare Other | Admitting: "Endocrinology

## 2023-07-26 ENCOUNTER — Other Ambulatory Visit (INDEPENDENT_AMBULATORY_CARE_PROVIDER_SITE_OTHER): Payer: Medicare Other | Admitting: Pharmacist

## 2023-07-26 ENCOUNTER — Other Ambulatory Visit: Payer: Self-pay | Admitting: Nurse Practitioner

## 2023-07-26 DIAGNOSIS — R600 Localized edema: Secondary | ICD-10-CM

## 2023-07-26 DIAGNOSIS — E1165 Type 2 diabetes mellitus with hyperglycemia: Secondary | ICD-10-CM | POA: Diagnosis not present

## 2023-07-26 DIAGNOSIS — Z794 Long term (current) use of insulin: Secondary | ICD-10-CM

## 2023-07-26 DIAGNOSIS — I5032 Chronic diastolic (congestive) heart failure: Secondary | ICD-10-CM

## 2023-07-26 MED ORDER — IPRATROPIUM-ALBUTEROL 0.5-2.5 (3) MG/3ML IN SOLN: 3.0000 mL | Freq: Four times a day (QID) | RESPIRATORY_TRACT | 12 refills | Status: AC | PRN

## 2023-07-26 NOTE — Progress Notes (Signed)
07/26/2023 Name: Sharon Swanson MRN: [id number] DOB: 1961-05-28  Chief Complaint  Patient presents with   Medication Management   Diabetes   Hypertension    Sharon Swanson is a 62 y.o. year old female who presented for a telephone visit.   They were referred to the pharmacist by their PCP for assistance in managing complex medication management.    Subjective:  Care Team: Primary Care Provider: Ivonne Andrew, NP ; Next Scheduled Visit: 10/12/23 Endocrinologist Sharon Swanson; Next Scheduled Visit: 08/01/23  Medication Access/Adherence  Current Pharmacy:  [id number] [place], [place] - [address] AT [facility] [address] [place] [address] Phone: [phone] Fax: [phone]  [facility] - [place], [place] - [id number] [place] [address] Phone: [phone] Fax: [phone]   Patient reports affordability concerns with their medications: No  Patient reports access/transportation concerns to their pharmacy: No  Patient reports adherence concerns with their medications:  Yes    Reports she feels "sick" after she takes her medications every day, and reports it lasts throughout the day. Describes as feeling discombobulated, weak, and fatigued.. Reports this has been going on for several months. Notes it is not nausea/vomiting/GI upset, but more general malaise.   Notes some days she skips oral medications because she doesn't want to feel bad. She acknowledges that she doesn't want long term complications from stopping all her medications, but has considered stopping all of her medications because of how bad she feels.   Some improvement when she lays/sits down, but still doesn't feel like herself. Does feels worse when she stands up/sits up. Reports some dizziness, feels like she can't stay up for periods of time to do ADLs like cooking, cleaning.   She is wearing CGM, so knows that episodes  are not related to high or low glucose. Has not been checking home blood pressures, but doesn't feel like she typically does when her blood pressure has historically been high.   Diabetes:  Current medications: Ozempic 0.5 mg weekly, metformin 1000 mg twice daily, Lantus 20 units daily, Humalog 10 units twice daily with meals - instructed to increase to 14 units with meals, but has been taking 20 units twice daily   Reports readings have improved from 300s to mid 100s after adjustment in Humalog at last Endocrinology visit.   Denies a temporal correlation between symptoms described above and starting Ozempic  Patient denies hypoglycemic s/sx including dizziness, shakiness, sweating.    Hyperlipidemia/ASCVD Risk Reduction  Current lipid lowering medications: atorvastatin 80 mg daily - dose increased in June, but she is not describing her symptoms as muscle aches/pains  Heart Failure:  Current medications:  ACEi/ARB/ARNI: Entresto 97/103 mg twice daily SGLT2i: none, consider moving forward  Beta blocker: carvedilol 25 mg twice daily Mineralocorticoid Receptor Antagonist: spironolactone 50 mg daily  Diuretic regimen: furosemide 40 mg twice daily  Current home blood pressure readings: has not been checking.   Patient notes continued swelling in her feet/legs that occurs throughout the day and is improved by compression hose.   Objective:  Lab Results  Component Value Date   HGBA1C 9.8 (H) 07/10/2023    Lab Results  Component Value Date   CREATININE 0.68 07/10/2023   BUN 10 07/10/2023   NA 140 07/10/2023   K 3.7 07/10/2023   CL 99 07/10/2023   CO2 29 07/10/2023    Lab Results  Component Value Date   CHOL 227 (H)  07/10/2023   HDL 82.80 07/10/2023   LDLCALC 116 (H) 07/10/2023   TRIG 137.0 07/10/2023   CHOLHDL 3 07/10/2023    Medications Reviewed Today     Reviewed by Sharon Swanson, Swanson (Pharmacist) on 07/26/23 at 1113  Med List Status: <None>    Medication Order Taking? Sig Documenting Provider Last Dose Status Informant  0.9 %  sodium chloride infusion [id number]   Swanson, Sharon Caddy, MD  Active   acetaminophen (TYLENOL) 325 MG tablet [id number] No Take 2 tablets (650 mg total) by mouth every 6 (six) hours as needed. Genice Rouge, MD Taking Active            Med Note [facility], Sharon Swanson   Wed Dec 13, 2022 10:56 AM) Needs refill   albuterol (VENTOLIN HFA) 108 (90 Base) MCG/ACT inhaler [id number] No Inhale 2 puffs into the lungs every 4 (four) hours as needed for wheezing or shortness of breath. Sharon Andrew, NP Taking Active   amLODipine (NORVASC) 10 MG tablet [id number] No TAKE 1 TABLET BY MOUTH DAILY Sharon Andrew, NP Taking Active   aspirin EC 81 MG tablet [id number] No Take 1 tablet (81 mg total) by mouth daily. Swallow whole. Sharon Swanson., NP Taking Active   atorvastatin (LIPITOR) 80 MG tablet [id number] No Take 1 tablet (80 mg total) by mouth daily. Sharon Andrew, NP Taking Active   carvedilol (COREG) 25 MG tablet [id number] No Take 1 tablet (25 mg total) by mouth 2 (two) times daily. Sharon Andrew, NP Taking Active   cetirizine (ZYRTEC) 10 MG tablet [id number] No Take 1 tablet (10 mg total) by mouth in the morning. Sharon Merino, NP Taking Active Self  Continuous Blood Gluc Receiver (FREESTYLE LIBRE 2 READER) DEVI [id number] No Use as directed daily. Sharon Beers, FNP Taking Active   Continuous Glucose Sensor (FREESTYLE LIBRE 2 SENSOR) MISC [id number] No 1 application  by Does not apply route every 14 (fourteen) days. Sharon Andrew, NP Taking Active   DULoxetine (CYMBALTA) 60 MG capsule [id number] No TAKE 1 CAPSULE BY MOUTH DAILY Sharon Andrew, NP Taking Active   fluticasone (FLONASE) 50 MCG/ACT nasal spray [id number] No SHAKE LIQUID AND USE 1 SPRAY IN EACH NOSTRIL DAILY Sharon Andrew, NP Taking Active   furosemide (LASIX) 40 MG tablet [id number] No TAKE 1 TABLET BY MOUTH TWICE  DAILY Sharon Andrew,  NP Taking Active   gabapentin (NEURONTIN) 300 MG capsule [id number] No TAKE 1 CAPSULE BY MOUTH 3 TIMES  DAILY Sharon Andrew, NP Taking Active   insulin glargine (LANTUS) 100 UNIT/ML injection [id number] No Inject 0.2 mLs (20 Units total) into the skin at bedtime. Sharon Andrew, NP Taking Active   insulin lispro (HUMALOG) 100 UNIT/ML injection [id number] No Inject 0.1 mLs (10 Units total) into the skin 2 (two) times daily with a meal. Sharon Andrew, NP Taking Active   ipratropium-albuterol (DUONEB) 0.5-2.5 (3) MG/3ML SOLN [id number] No Take 3 mLs by nebulization every 6 (six) hours as needed (shortness of breath, wheeze). Kallie Locks, FNP Taking Expired 06/29/23 2359 Self  linaclotide (LINZESS) 145 MCG CAPS capsule [id number] No Take 1 capsule (145 mcg total) by mouth daily before breakfast. Sharon Andrew, NP Taking Active   metFORMIN (GLUCOPHAGE) 1000 MG tablet [id number] No Take 1 tablet (1,000 mg total) by mouth 2 (two) times daily with a meal. Sharon Andrew, NP Taking Active   ondansetron (ZOFRAN) 4 MG tablet [id number] No Take 1 tablet (  4 mg total) by mouth every 8 (eight) hours as needed for nausea or vomiting. Lovorn, Megan, MD Taking Active Self  sacubitril-valsartan (ENTRESTO) 97-103 MG [id number] No Take 1 tablet by mouth 2 (two) times daily. Sharon Beers, FNP Taking Active   Semaglutide,0.25 or 0.5MG /DOS, (OZEMPIC, 0.25 OR 0.5 MG/DOSE,) 2 MG/3ML SOPN [id number] No Inject 0.5 mg into the skin once a week. Sharon Andrew, NP Taking Active   spironolactone (ALDACTONE) 50 MG tablet [id number] No Take 1 tablet (50 mg total) by mouth daily. Sharon Andrew, NP Taking Active   tiZANidine (ZANAFLEX) 4 MG tablet [id number] No Take 1 tablet (4 mg total) by mouth every 6 (six) hours as needed for muscle spasms. Sharon Andrew, NP Taking Active   Patient not taking:  Discontinued 07/26/23 1113            Med Note (Nestor Wieneke, Kyce Ging T   Tue Dec 26, 2022  8:40 AM)                 Assessment/Plan:   Diabetes: - Currently uncontrolled but improving per patient report - Reviewed goal A1c, goal fasting, and goal 2 hour post prandial glucose - Symptoms do not sound like typical symptoms of IR metformin (diarrhea, stomach upset), but could consider switching to XR 500 mg twice daily to see if any change in symptoms.  - Discussed patient's concerns about improving blood sugar control. No worsening in symptoms since Ozempic was started, so encouraged to discuss Ozempic dose increase with Endocrinology next week - Recommend to check glucose using CGM   Hyperlipidemia/ASCVD Risk Reduction: - Currently uncontrolled.  - Could consider addition of ezetimibe to target LDL <70, but will avoid adding anything to complicate medical picture at this time  Heart Failure: - Currently unclear if well managed given lack of blood pressure readings and reported symptoms. Does note use of Duonebs/albuterol ~3 times monthly; concern for fluid status and HF contributing to reported malaise.  - Overdue for cardiology follow up. Patient given number for [facility] to call and schedule follow up appointment. Encouraged to continue current regimen until discussed with provider.  - Recommend to start checking home blood pressure readings, especially when feeling poorly, including heart rate.    Requests refill on Duonebs, will collaborate with PCP for this. Patient asked if going to the hospital to "fix medications" would be a good idea. Advised to avoid presenting to the hospital unless severe symptoms including, severe pain, severe headaches, changes in vision, fever, or any other concerning symptoms  Follow Up Plan: follow up call in 4 weeks  Catie Eppie Gibson, PharmD, BCACP, CPP Clinical Pharmacist [facility] [phone]

## 2023-07-27 ENCOUNTER — Other Ambulatory Visit: Payer: Self-pay | Admitting: Nurse Practitioner

## 2023-07-27 DIAGNOSIS — R7309 Other abnormal glucose: Secondary | ICD-10-CM

## 2023-07-27 DIAGNOSIS — E1165 Type 2 diabetes mellitus with hyperglycemia: Secondary | ICD-10-CM

## 2023-07-27 DIAGNOSIS — R739 Hyperglycemia, unspecified: Secondary | ICD-10-CM

## 2023-08-01 ENCOUNTER — Ambulatory Visit: Payer: Medicare Other | Admitting: "Endocrinology

## 2023-08-21 ENCOUNTER — Other Ambulatory Visit: Payer: Self-pay

## 2023-09-07 ENCOUNTER — Telehealth: Payer: Self-pay | Admitting: Nurse Practitioner

## 2023-09-07 NOTE — Telephone Encounter (Signed)
Copied from CRM [id number]. Topic: Medical Record Request - Other >> Sep 07, 2023 10:17 AM Fonda Kinder J wrote: Reason for CRM: Ardelle Balls from [facility] called to check in on the status of labs that was faxed over & needing a signature. Representative is requesting a call back at [phone]

## 2023-09-07 NOTE — Telephone Encounter (Signed)
FORM HAS BEEN SIGNED AND FAXED BACK

## 2023-09-07 NOTE — Telephone Encounter (Signed)
Fax found. Given to Cherice to have Tonya's signature and faxed back

## 2023-09-25 ENCOUNTER — Telehealth: Payer: Self-pay | Admitting: Pharmacist

## 2023-09-25 ENCOUNTER — Other Ambulatory Visit: Payer: Self-pay

## 2023-09-25 NOTE — Progress Notes (Signed)
 Attempted to contact patient for scheduled appointment for medication management. Left HIPAA compliant message for patient to return my call at their convenience.   Patient has follow up scheduled with PCP. Previously encouraged to schedule follow up with Endocrinology and Cardiology to discuss her medication concerns.   Catie IVAR Centers, PharmD, BCACP, CPP Clinical Pharmacist [facility] [phone]

## 2023-10-09 ENCOUNTER — Encounter: Payer: Self-pay | Admitting: Acute Care

## 2023-10-12 ENCOUNTER — Ambulatory Visit: Payer: Self-pay | Admitting: Nurse Practitioner

## 2023-10-19 ENCOUNTER — Ambulatory Visit: Payer: Self-pay | Admitting: Nurse Practitioner

## 2023-11-17 ENCOUNTER — Other Ambulatory Visit: Payer: Self-pay | Admitting: Nurse Practitioner

## 2023-11-22 ENCOUNTER — Ambulatory Visit: Payer: Self-pay | Admitting: Nurse Practitioner

## 2023-11-26 ENCOUNTER — Other Ambulatory Visit: Payer: Self-pay | Admitting: Nurse Practitioner

## 2023-11-26 DIAGNOSIS — I10 Essential (primary) hypertension: Secondary | ICD-10-CM

## 2023-11-26 DIAGNOSIS — I5032 Chronic diastolic (congestive) heart failure: Secondary | ICD-10-CM

## 2023-12-04 ENCOUNTER — Other Ambulatory Visit: Payer: Self-pay

## 2023-12-05 ENCOUNTER — Telehealth: Payer: Self-pay

## 2023-12-05 NOTE — Telephone Encounter (Signed)
 Submitted chart notes to complete Entresto prior authorization via fax to [phone]/[facility] today.

## 2023-12-06 ENCOUNTER — Other Ambulatory Visit: Payer: Self-pay

## 2023-12-10 ENCOUNTER — Other Ambulatory Visit: Payer: Self-pay

## 2023-12-14 ENCOUNTER — Telehealth: Payer: Self-pay | Admitting: Nurse Practitioner

## 2023-12-14 NOTE — Telephone Encounter (Signed)
 Copied from CRM [id number]. Topic: General - Other >> Dec 14, 2023  1:33 PM Everette C wrote: Reason for CRM: Greggory Stallion with [facility] has called to confirm receipt of documents submitted to the practice today 12/14/23 via fax   Please contact Greggory Stallion at [phone] when possible to confirm

## 2023-12-18 NOTE — Telephone Encounter (Unsigned)
 Copied from CRM [id number]. Topic: General - Other >> Dec 18, 2023  4:20 PM Gildardo Pounds wrote: Reason for CRM: Greggory Stallion with [facility] is checking the status of returning the form faxed. Callback number is [phone]

## 2023-12-26 ENCOUNTER — Telehealth: Payer: Self-pay

## 2023-12-26 NOTE — Telephone Encounter (Signed)
 Copied from CRM [id number]. Topic: Clinical - Request for Lab/Test Order >> Dec 25, 2023  4:30 PM Ivette P wrote: Reason for CRM: Greggory Stallion called in from [employer] about pt and a UTI order request. Greggory Stallion claims he called in last week and spoke to someone about getting forms signed. Was told provider would be back 03/31 to sign form and send back. Greggory Stallion says this request is now past due. Greggory Stallion is requesting that any provider in the same clinic sign the forms or if Provider Tanda Rockers can sign electronically. This matter is now urgent.    Greggory Stallion [phone] - ask for george   Please advise if sent already. KH

## 2024-01-10 ENCOUNTER — Emergency Department (HOSPITAL_COMMUNITY)

## 2024-01-10 ENCOUNTER — Observation Stay (HOSPITAL_COMMUNITY)
Admission: EM | Admit: 2024-01-10 | Discharge: 2024-01-12 | Disposition: A | Discharge status: Home / Self Care | Attending: Emergency Medicine | Admitting: Emergency Medicine

## 2024-01-10 ENCOUNTER — Other Ambulatory Visit: Payer: Self-pay

## 2024-01-10 ENCOUNTER — Encounter (HOSPITAL_COMMUNITY): Payer: Self-pay

## 2024-01-10 DIAGNOSIS — Z794 Long term (current) use of insulin: Secondary | ICD-10-CM | POA: Insufficient documentation

## 2024-01-10 DIAGNOSIS — Z7985 Long-term (current) use of injectable non-insulin antidiabetic drugs: Secondary | ICD-10-CM | POA: Diagnosis not present

## 2024-01-10 DIAGNOSIS — Z79899 Other long term (current) drug therapy: Secondary | ICD-10-CM | POA: Insufficient documentation

## 2024-01-10 DIAGNOSIS — F17210 Nicotine dependence, cigarettes, uncomplicated: Secondary | ICD-10-CM | POA: Diagnosis not present

## 2024-01-10 DIAGNOSIS — J449 Chronic obstructive pulmonary disease, unspecified: Secondary | ICD-10-CM | POA: Diagnosis not present

## 2024-01-10 DIAGNOSIS — R0600 Dyspnea, unspecified: Secondary | ICD-10-CM | POA: Diagnosis not present

## 2024-01-10 DIAGNOSIS — I160 Hypertensive urgency: Secondary | ICD-10-CM | POA: Diagnosis not present

## 2024-01-10 DIAGNOSIS — R531 Weakness: Secondary | ICD-10-CM | POA: Diagnosis not present

## 2024-01-10 DIAGNOSIS — I509 Heart failure, unspecified: Principal | ICD-10-CM

## 2024-01-10 DIAGNOSIS — Z7982 Long term (current) use of aspirin: Secondary | ICD-10-CM | POA: Diagnosis not present

## 2024-01-10 DIAGNOSIS — G4733 Obstructive sleep apnea (adult) (pediatric): Secondary | ICD-10-CM | POA: Diagnosis not present

## 2024-01-10 DIAGNOSIS — R609 Edema, unspecified: Secondary | ICD-10-CM | POA: Insufficient documentation

## 2024-01-10 DIAGNOSIS — Z6841 Body Mass Index (BMI) 40.0 and over, adult: Secondary | ICD-10-CM | POA: Insufficient documentation

## 2024-01-10 DIAGNOSIS — E1165 Type 2 diabetes mellitus with hyperglycemia: Secondary | ICD-10-CM | POA: Diagnosis not present

## 2024-01-10 DIAGNOSIS — I110 Hypertensive heart disease with heart failure: Secondary | ICD-10-CM | POA: Diagnosis present

## 2024-01-10 DIAGNOSIS — R0602 Shortness of breath: Secondary | ICD-10-CM

## 2024-01-10 DIAGNOSIS — I5032 Chronic diastolic (congestive) heart failure: Secondary | ICD-10-CM | POA: Insufficient documentation

## 2024-01-10 DIAGNOSIS — E66813 Obesity, class 3: Secondary | ICD-10-CM | POA: Diagnosis not present

## 2024-01-10 DIAGNOSIS — Z7984 Long term (current) use of oral hypoglycemic drugs: Secondary | ICD-10-CM | POA: Insufficient documentation

## 2024-01-10 DIAGNOSIS — J302 Other seasonal allergic rhinitis: Secondary | ICD-10-CM

## 2024-01-10 LAB — COMPREHENSIVE METABOLIC PANEL WITH GFR
ALT: 28 U/L (ref 0–44)
AST: 27 U/L (ref 15–41)
Albumin: 3.6 g/dL (ref 3.5–5.0)
Alkaline Phosphatase: 65 U/L (ref 38–126)
Anion gap: 9 (ref 5–15)
BUN: 10 mg/dL (ref 8–23)
CO2: 29 mmol/L (ref 22–32)
Calcium: 8.9 mg/dL (ref 8.9–10.3)
Chloride: 99 mmol/L (ref 98–111)
Creatinine, Ser: 0.65 mg/dL (ref 0.44–1.00)
GFR, Estimated: >60 mL/min (ref >60)
Glucose, Bld: 242 mg/dL — ABNORMAL HIGH (ref 70–99)
Potassium: 3.7 mmol/L (ref 3.5–5.1)
Sodium: 137 mmol/L (ref 135–145)
Total Bilirubin: 0.6 mg/dL (ref 0.0–1.2)
Total Protein: 7.1 g/dL (ref 6.5–8.1)

## 2024-01-10 LAB — URINALYSIS, ROUTINE W REFLEX MICROSCOPIC
Bilirubin Urine: NEGATIVE
Glucose, UA: >=500 mg/dL — AB
Hgb urine dipstick: NEGATIVE
Ketones, ur: NEGATIVE mg/dL
Leukocytes,Ua: NEGATIVE
Nitrite: NEGATIVE
Protein, ur: 100 mg/dL — AB
Specific Gravity, Urine: 1.008 (ref 1.005–1.030)
pH: 7 (ref 5.0–8.0)

## 2024-01-10 LAB — CBC WITH DIFFERENTIAL/PLATELET
Abs Immature Granulocytes: 0.01 10*3/uL (ref 0.00–0.07)
Basophils Absolute: 0 10*3/uL (ref 0.0–0.1)
Basophils Relative: 1 %
Eosinophils Absolute: 0.2 10*3/uL (ref 0.0–0.5)
Eosinophils Relative: 3 %
HCT: 51 % — ABNORMAL HIGH (ref 36.0–46.0)
Hemoglobin: 16.4 g/dL — ABNORMAL HIGH (ref 12.0–15.0)
Immature Granulocytes: 0 %
Lymphocytes Relative: 32 %
Lymphs Abs: 1.6 10*3/uL (ref 0.7–4.0)
MCH: 29.7 pg (ref 26.0–34.0)
MCHC: 32.2 g/dL (ref 30.0–36.0)
MCV: 92.2 fL (ref 80.0–100.0)
Monocytes Absolute: 0.6 10*3/uL (ref 0.1–1.0)
Monocytes Relative: 11 %
Neutro Abs: 2.6 10*3/uL (ref 1.7–7.7)
Neutrophils Relative %: 53 %
Platelets: 154 10*3/uL (ref 150–400)
RBC: 5.53 MIL/uL — ABNORMAL HIGH (ref 3.87–5.11)
RDW: 13.3 % (ref 11.5–15.5)
WBC: 5 10*3/uL (ref 4.0–10.5)
nRBC: 0 % (ref 0.0–0.2)

## 2024-01-10 LAB — BRAIN NATRIURETIC PEPTIDE: B Natriuretic Peptide: 59.2 pg/mL (ref 0.0–100.0)

## 2024-01-10 LAB — TROPONIN I (HIGH SENSITIVITY)
Troponin I (High Sensitivity): 10 ng/L (ref <18)
Troponin I (High Sensitivity): 9 ng/L (ref <18)

## 2024-01-10 LAB — CBG MONITORING, ED: Glucose-Capillary: 257 mg/dL — ABNORMAL HIGH (ref 70–99)

## 2024-01-10 LAB — GLUCOSE, CAPILLARY: Glucose-Capillary: 329 mg/dL — ABNORMAL HIGH (ref 70–99)

## 2024-01-10 MED ORDER — PROCHLORPERAZINE EDISYLATE 10 MG/2ML IJ SOLN: 5.0000 mg | Freq: Four times a day (QID) | INTRAMUSCULAR | Status: DC | PRN

## 2024-01-10 MED ORDER — POLYETHYLENE GLYCOL 3350 17 G PO PACK: 17.0000 g | PACK | Freq: Every day | ORAL | Status: DC | PRN

## 2024-01-10 MED ORDER — ATORVASTATIN CALCIUM 40 MG PO TABS
80.0000 mg | ORAL_TABLET | Freq: Every day | ORAL | Status: DC
  Administered 2024-01-10 – 2024-01-12 (×3): 80 mg via ORAL
  Filled 2024-01-10 (×3): qty 2

## 2024-01-10 MED ORDER — INSULIN GLARGINE-YFGN 100 UNIT/ML ~~LOC~~ SOLN
5.0000 [IU] | Freq: Two times a day (BID) | SUBCUTANEOUS | Status: DC
  Administered 2024-01-10: 5 [IU] via SUBCUTANEOUS
  Filled 2024-01-10 (×2): qty 0.05

## 2024-01-10 MED ORDER — ALBUTEROL SULFATE (2.5 MG/3ML) 0.083% IN NEBU
3.0000 mL | INHALATION_SOLUTION | RESPIRATORY_TRACT | Status: DC | PRN
  Administered 2024-01-11 – 2024-01-12 (×2): 3 mL via RESPIRATORY_TRACT
  Filled 2024-01-10 (×2): qty 3

## 2024-01-10 MED ORDER — LABETALOL HCL 5 MG/ML IV SOLN
5.0000 mg | Freq: Once | INTRAVENOUS | Status: AC
  Administered 2024-01-10: 5 mg via INTRAVENOUS
  Filled 2024-01-10: qty 4

## 2024-01-10 MED ORDER — LABETALOL HCL 5 MG/ML IV SOLN
5.0000 mg | INTRAVENOUS | Status: DC | PRN
  Filled 2024-01-10: qty 4

## 2024-01-10 MED ORDER — SPIRONOLACTONE 25 MG PO TABS
50.0000 mg | ORAL_TABLET | Freq: Every day | ORAL | Status: DC
  Administered 2024-01-10 – 2024-01-12 (×3): 50 mg via ORAL
  Filled 2024-01-10 (×3): qty 2

## 2024-01-10 MED ORDER — CARVEDILOL 12.5 MG PO TABS
25.0000 mg | ORAL_TABLET | Freq: Two times a day (BID) | ORAL | Status: DC
  Administered 2024-01-10 – 2024-01-12 (×4): 25 mg via ORAL
  Filled 2024-01-10 (×5): qty 2

## 2024-01-10 MED ORDER — INSULIN ASPART 100 UNIT/ML IJ SOLN
0.0000 [IU] | Freq: Three times a day (TID) | INTRAMUSCULAR | Status: DC
  Administered 2024-01-11: 9 [IU] via SUBCUTANEOUS
  Administered 2024-01-11 (×2): 7 [IU] via SUBCUTANEOUS
  Administered 2024-01-12: 5 [IU] via SUBCUTANEOUS
  Filled 2024-01-10: qty 0.09

## 2024-01-10 MED ORDER — INSULIN ASPART 100 UNIT/ML IJ SOLN
0.0000 [IU] | Freq: Every day | INTRAMUSCULAR | Status: DC
  Administered 2024-01-10: 4 [IU] via SUBCUTANEOUS
  Administered 2024-01-11: 3 [IU] via SUBCUTANEOUS
  Filled 2024-01-10: qty 0.05

## 2024-01-10 MED ORDER — AMLODIPINE BESYLATE 5 MG PO TABS
10.0000 mg | ORAL_TABLET | Freq: Every day | ORAL | Status: DC
  Administered 2024-01-10 – 2024-01-12 (×3): 10 mg via ORAL
  Filled 2024-01-10 (×3): qty 2

## 2024-01-10 MED ORDER — FUROSEMIDE 10 MG/ML IJ SOLN
40.0000 mg | Freq: Once | INTRAMUSCULAR | Status: AC
  Administered 2024-01-10: 40 mg via INTRAVENOUS
  Filled 2024-01-10: qty 4

## 2024-01-10 MED ORDER — ACETAMINOPHEN 325 MG PO TABS
650.0000 mg | ORAL_TABLET | Freq: Four times a day (QID) | ORAL | Status: DC | PRN
  Administered 2024-01-11 – 2024-01-12 (×3): 650 mg via ORAL
  Filled 2024-01-10 (×2): qty 2

## 2024-01-10 MED ORDER — MELATONIN 5 MG PO TABS
5.0000 mg | ORAL_TABLET | Freq: Every evening | ORAL | Status: DC | PRN
  Administered 2024-01-10 – 2024-01-11 (×2): 5 mg via ORAL
  Filled 2024-01-10 (×2): qty 1

## 2024-01-10 MED ORDER — ENOXAPARIN SODIUM 40 MG/0.4ML IJ SOSY
40.0000 mg | PREFILLED_SYRINGE | INTRAMUSCULAR | Status: DC
  Administered 2024-01-11 – 2024-01-12 (×2): 40 mg via SUBCUTANEOUS
  Filled 2024-01-10 (×2): qty 0.4

## 2024-01-10 MED ORDER — SACUBITRIL-VALSARTAN 97-103 MG PO TABS
1.0000 | ORAL_TABLET | Freq: Two times a day (BID) | ORAL | Status: DC
  Administered 2024-01-11 – 2024-01-12 (×4): 1 via ORAL
  Filled 2024-01-10 (×4): qty 1

## 2024-01-10 MED ORDER — PNEUMOCOCCAL 20-VAL CONJ VACC 0.5 ML IM SUSY
0.5000 mL | PREFILLED_SYRINGE | INTRAMUSCULAR | Status: DC
  Filled 2024-01-10: qty 0.5

## 2024-01-10 NOTE — ED Provider Notes (Signed)
 [place] EMERGENCY DEPARTMENT AT [facility] Provider Note   CSN: [id number] Arrival date & time: 01/10/24  1613     History  Chief Complaint  Patient presents with   Hypertension    Sharon Swanson is a 63 y.o. female.  The history is provided by the patient and medical records. No language interpreter was used.  Hypertension     63 year old female history of hypertension currently on amlodipine, carvedilol, Entresto, spironolactone, tobacco use, COPD, anxiety, CHF brought here via EMS from home with concerns of elevated blood pressure.  Patient states for the past 2 weeks she has noticed that her blood pressure has remained high despite taking off her prescribed blood pressure medications.  She also endorsed having exertional shortness of breath even with walking short distance.  She endorsed having headache and overall not feeling well.  Reported bouts of dizziness.  Headache is described as a mild throbbing sensation across her head.  No vision changes denies any focal numbness or focal weakness and denies any nausea vomit diarrhea.  No recent infection.  Home Medications Prior to Admission medications   Medication Sig Start Date End Date Taking? Authorizing Provider  acetaminophen (TYLENOL) 325 MG tablet Take 2 tablets (650 mg total) by mouth every 6 (six) hours as needed. 03/06/22   Lovorn, Aundra Millet, MD  albuterol (VENTOLIN HFA) 108 (90 Base) MCG/ACT inhaler Inhale 2 puffs into the lungs every 4 (four) hours as needed for wheezing or shortness of breath. 06/22/23   Ivonne Andrew, NP  amLODipine (NORVASC) 10 MG tablet TAKE 1 TABLET BY MOUTH DAILY 06/29/23   Ivonne Andrew, NP  aspirin EC 81 MG tablet Take 1 tablet (81 mg total) by mouth daily. Swallow whole. 06/12/22   Gaston Islam., NP  atorvastatin (LIPITOR) 80 MG tablet TAKE 1 TABLET BY MOUTH DAILY 11/19/23   Paseda, Baird Kay, FNP  carvedilol (COREG) 25 MG tablet Take 1 tablet (25 mg total) by mouth 2 (two)  times daily. 06/29/23   Ivonne Andrew, NP  cetirizine (ZYRTEC) 10 MG tablet Take 1 tablet (10 mg total) by mouth in the morning. 03/21/21   Barbette Merino, NP  Continuous Blood Gluc Receiver (FREESTYLE LIBRE 2 READER) DEVI Use as directed daily. 11/07/22   Paseda, Baird Kay, FNP  Continuous Glucose Sensor (FREESTYLE LIBRE 2 SENSOR) MISC 1 application  by Does not apply route every 14 (fourteen) days. 06/29/23   Ivonne Andrew, NP  DULoxetine (CYMBALTA) 60 MG capsule TAKE 1 CAPSULE BY MOUTH DAILY 06/22/23   Ivonne Andrew, NP  fluticasone [facility]) 50 MCG/ACT nasal spray SHAKE LIQUID AND USE 1 SPRAY IN EACH NOSTRIL DAILY 01/10/23   Ivonne Andrew, NP  furosemide (LASIX) 40 MG tablet TAKE 1 TABLET(40 MG) BY MOUTH TWICE DAILY 11/26/23   Ivonne Andrew, NP  gabapentin (NEURONTIN) 300 MG capsule TAKE 1 CAPSULE BY MOUTH 3 TIMES  DAILY 06/22/23   Ivonne Andrew, NP  insulin glargine (LANTUS) 100 UNIT/ML injection ADMINISTER 20 UNITS UNDER THE SKIN AT BEDTIME 07/27/23   Ivonne Andrew, NP  insulin lispro (HUMALOG) 100 UNIT/ML injection Inject 0.1 mLs (10 Units total) into the skin 2 (two) times daily with a meal. 06/29/23   Ivonne Andrew, NP  ipratropium-albuterol (DUONEB) 0.5-2.5 (3) MG/3ML SOLN Take 3 mLs by nebulization every 6 (six) hours as needed (shortness of breath, wheeze). 07/26/23 08/19/24  Ivonne Andrew, NP  linaclotide [facility]) 145 MCG CAPS capsule Take 1  capsule (145 mcg total) by mouth daily before breakfast. 06/29/23   Ivonne Andrew, NP  metFORMIN (GLUCOPHAGE) 1000 MG tablet Take 1 tablet (1,000 mg total) by mouth 2 (two) times daily with a meal. 06/29/23   Ivonne Andrew, NP  ondansetron (ZOFRAN) 4 MG tablet Take 1 tablet (4 mg total) by mouth every 8 (eight) hours as needed for nausea or vomiting. 09/07/21   Lovorn, Aundra Millet, MD  sacubitril-valsartan (ENTRESTO) 97-103 MG Take 1 tablet by mouth 2 (two) times daily. 02/26/23   Paseda, Baird Kay, FNP  Semaglutide,0.25 or  0.5MG /DOS, (OZEMPIC, 0.25 OR 0.5 MG/DOSE,) 2 MG/3ML SOPN Inject 0.5 mg into the skin once a week. 06/29/23   Ivonne Andrew, NP  spironolactone (ALDACTONE) 50 MG tablet Take 1 tablet (50 mg total) by mouth daily. 06/22/23   Ivonne Andrew, NP  tiZANidine (ZANAFLEX) 4 MG tablet Take 1 tablet (4 mg total) by mouth every 6 (six) hours as needed for muscle spasms. 01/18/23   Ivonne Andrew, NP      Allergies    Aspirin    Review of Systems   Review of Systems  All other systems reviewed and are negative.   Physical Exam Updated Vital Signs BP (!) 222/123   Pulse 96   Temp 98.6 F (37 C) (Oral)   Resp 19   Ht 5\' 7"  (1.702 m)   Wt 127 kg   SpO2 96%   BMI 43.85 kg/m  Physical Exam Vitals and nursing note reviewed.  Constitutional:      General: She is not in acute distress.    Appearance: She is well-developed.  HENT:     Head: Atraumatic.  Eyes:     Conjunctiva/sclera: Conjunctivae normal.  Cardiovascular:     Rate and Rhythm: Normal rate and regular rhythm.     Pulses: Normal pulses.     Heart sounds: Normal heart sounds.  Pulmonary:     Effort: Pulmonary effort is normal.     Breath sounds: No wheezing, rhonchi or rales.  Abdominal:     Palpations: Abdomen is soft.  Musculoskeletal:     Cervical back: Neck supple.     Right lower leg: Edema present.     Left lower leg: Edema present.     Comments: 2+ pitting edema noted to bilateral lower extremities extending towards the knees.  Right lower extremity with signs of chronic venous stasis and warm to the touch.  Skin:    Findings: No rash.  Neurological:     Mental Status: She is alert.  Psychiatric:        Mood and Affect: Mood normal.     ED Results / Procedures / Treatments   Labs (all labs ordered are listed, but only abnormal results are displayed) Labs Reviewed  COMPREHENSIVE METABOLIC PANEL WITH GFR - Abnormal; Notable for the following components:      Result Value   Glucose, Bld 242 (*)    All  other components within normal limits  CBC WITH DIFFERENTIAL/PLATELET - Abnormal; Notable for the following components:   RBC 5.53 (*)    Hemoglobin 16.4 (*)    HCT 51.0 (*)    All other components within normal limits  URINALYSIS, ROUTINE W REFLEX MICROSCOPIC - Abnormal; Notable for the following components:   Color, Urine STRAW (*)    Glucose, UA >=500 (*)    Protein, ur 100 (*)    Bacteria, UA RARE (*)    All other components within normal  limits  CBG MONITORING, ED - Abnormal; Notable for the following components:   Glucose-Capillary 257 (*)    All other components within normal limits  BRAIN NATRIURETIC PEPTIDE  TROPONIN I (HIGH SENSITIVITY)  TROPONIN I (HIGH SENSITIVITY)    EKG EKG Interpretation Date/Time:  Thursday January 10 2024 16:25:54 EDT Ventricular Rate:  95 PR Interval:  177 QRS Duration:  86 QT Interval:  357 QTC Calculation: 449 R Axis:   -56  Text Interpretation: Sinus rhythm Probable left atrial enlargement Left anterior fascicular block Abnormal R-wave progression, late transition Confirmed by Glyn Ade [phone]) on 01/10/2024 8:04:42 PM  Radiology CT Head Wo Contrast Result Date: 01/10/2024 CLINICAL DATA:  Hypertension with headache. EXAM: CT HEAD WITHOUT CONTRAST TECHNIQUE: Contiguous axial images were obtained from the base of the skull through the vertex without intravenous contrast. RADIATION DOSE REDUCTION: This exam was performed according to the departmental dose-optimization program which includes automated exposure control, adjustment of the mA and/or kV according to patient size and/or use of iterative reconstruction technique. COMPARISON:  Head CT 08/29/2020. FINDINGS: Brain: No evidence of acute infarction, hemorrhage, hydrocephalus, extra-axial collection or mass lesion/mass effect. There is a mildly expanded partially empty sella, but it is unchanged in appearance. Vascular: Minimal calcific plaque both siphons. No hyperdense central vessels.  Skull: Negative for fractures or focal lesions. Sinuses/Orbits: Mild chronic proptosis. Otherwise negative orbits. Clear visualized sinuses and mastoid air cells. Other: None. IMPRESSION: 1. No acute intracranial CT findings or interval changes. 2. Mildly expanded partially empty sella, unchanged in appearance. 3. Mild chronic proptosis. Electronically Signed   By: Almira Bar M.D.   On: 01/10/2024 20:22   DG Chest 2 View Result Date: 01/10/2024 CLINICAL DATA:  Shortness of breath. Elevated systolic blood pressure into the 190 is. EXAM: CHEST - 2 VIEW COMPARISON:  12/30/2020 FINDINGS: Stable mild cardiomegaly. Aortic atherosclerotic calcification. No focal consolidation, pleural effusion, or pneumothorax. No displaced rib fractures. IMPRESSION: Mild stable cardiomegaly. Otherwise no acute cardiopulmonary process. Electronically Signed   By: Minerva Fester M.D.   On: 01/10/2024 19:26    Procedures Procedures    Medications Ordered in ED Medications  insulin aspart (novoLOG) injection 0-9 Units (has no administration in time range)  insulin aspart (novoLOG) injection 0-5 Units (has no administration in time range)  insulin glargine-yfgn (SEMGLEE) injection 5 Units (has no administration in time range)  enoxaparin (LOVENOX) injection 40 mg (has no administration in time range)  labetalol (NORMODYNE) injection 5 mg (5 mg Intravenous Given 01/10/24 1730)  furosemide (LASIX) injection 40 mg (40 mg Intravenous Given 01/10/24 2055)    ED Course/ Medical Decision Making/ A&P Clinical Course as of 01/10/24 1745  Thu Jan 10, 2024  1745 Stable  HTN crisis vs urgency  Having headache Getting workup  [CC]    Clinical Course User Index [CC] Glyn Ade, MD                                 Medical Decision Making Amount and/or Complexity of Data Reviewed Labs: ordered. Radiology: ordered.  Risk Prescription drug management.   BP (!) 191/97   Pulse 94   Temp 97.9 F (36.6 C)  (Oral)   Resp 19   Ht 5\' 7"  (1.702 m)   Wt 127 kg   SpO2 95%   BMI 43.85 kg/m   5:23 PM 63 year old female history of hypertension currently on amlodipine, carvedilol, Entresto, spironolactone, tobacco use, COPD, anxiety,  CHF brought here via EMS from home with concerns of elevated blood pressure.  Patient states for the past 2 weeks she has noticed that her blood pressure has remained high despite taking off her prescribed blood pressure medications.  She also endorsed having exertional shortness of breath even with walking short distance.  She endorsed having headache and overall not feeling well.  Reported bouts of dizziness.  Headache is described as a mild throbbing sensation across her head.  No vision changes denies any focal numbness or focal weakness and denies any nausea vomit diarrhea.  No recent infection.  Exam notable for signs of CHF exacerbation as patient has bilateral leg swelling.  Blood pressure is moderately elevated with initial pressure of 222/123.  Patient given antihypertensive medication with mild improvement of her blood pressure.  -Labs ordered, independently viewed and interpreted by me.  Labs remarkable for CBG 257 without DKA.  BNP currently pending. Negative delta trop -The patient was maintained on a cardiac monitor.  I personally viewed and interpreted the cardiac monitored which showed an underlying rhythm of: sinus tachycardia -Imaging independently viewed and interpreted by me and I agree with radiologist's interpretation.  Result remarkable for head CT without acute changes.  Cxr showing cardiomegaly without acute changes -This patient presents to the ED for concern of HTN, this involves an extensive number of treatment options, and is a complaint that carries with it a high risk of complications and morbidity.  The differential diagnosis includes hypertensive emergency, hypertensive urgency, CHF exacerbation, MI, PE, COPD,  -Co morbidities that complicate the  patient evaluation includes HTN, CHF, COPD -Treatment includes lasix, labetalol -Reevaluation of the patient after these medicines showed that the patient improved -PCP office notes or outside notes reviewed -Discussion with attending Dr. Urban Garden.  Appreciate consultation from [facility] Dr. Del Favia who agrees to admit pt -Escalation to admission/observation considered: patient is agreeable with admission       Final Clinical Impression(s) / ED Diagnoses Final diagnoses:  Acute on chronic congestive heart failure, unspecified heart failure type [facility])  Hypertensive urgency    Rx / DC Orders ED Discharge Orders     None         Debbra Fairy, PA-C 01/10/24 2127    Onetha Bile, MD 01/10/24 2351

## 2024-01-10 NOTE — ED Triage Notes (Addendum)
 Patient BIB [facility] from home. Has been monitoring her BP from home for a couple weeks. Had a home health visit today. Her BP was 190 systolic per home health aide. Took her BP medication this morning. Has a headache. No chest pain or shortness of breath.

## 2024-01-10 NOTE — ED Provider Triage Note (Signed)
 Emergency Medicine Provider Triage Evaluation Note  Sharon Swanson , a 63 y.o. female  was evaluated in triage.  Pt complains of multiple complaints. Reports elevated blood pressures in the 170s-200s systolically, despite taking BP meds as prescribed, with intermittent chest pain and shortness of breath. No CP now. Also has diffuse abdominal pain with N/V/D for the past several days.  Review of Systems  Positive: Elevated BP, abdominal pain Negative: Fever  Physical Exam  BP (!) 222/123   Pulse 96   Temp 98.6 F (37 C) (Oral)   Resp 19   Ht 5\' 7"  (1.702 m)   Wt 127 kg   SpO2 96%   BMI 43.85 kg/m  Gen:   Awake, no distress   Resp:  Normal effort  MSK:   Moves extremities without difficulty  Other:    Medical Decision Making  Medically screening exam initiated at 4:43 PM.  Appropriate orders placed.  Willeen Novak was informed that the remainder of the evaluation will be completed by another provider, this initial triage assessment does not replace that evaluation, and the importance of remaining in the ED until their evaluation is complete.    Sonnie Dusky, PA-C 01/10/24 1645

## 2024-01-10 NOTE — H&P (Addendum)
 History and Physical  Amar Keenum ZOX:[id number] DOB: 1961-01-30 DOA: 01/10/2024  Referring physician: Debbra Fairy, PA-EDP  PCP: Jerrlyn Morel, NP  Outpatient Specialists: Cardiology. Swanson coming from: Home.  Chief Complaint: Uncontrolled blood pressure.  HPI: Sharon Swanson is a 63 y.o. female with medical history significant for morbid obesity with BMI of 43, uncontrolled type 2 diabetes, hyperlipidemia, hypertension, GERD, asthma, chronic bilateral lower extremity edema, COPD, OSA intolerant of CPAP, chronic HFpEF, presents to [facility] ER at Sharon recommendation of her home health specialist due to persistent uncontrolled blood pressure for Sharon past 2 weeks despite compliance with her home blood pressure medications.  Associated with dyspnea with minimal exertion.  Endorses having a headache.  Denies chest pain.  EMS was activated and Sharon Swanson was brought into [facility] ER for further evaluation.  In [place] ER, BP is severely elevated 222/123.  High-sensitivity troponin negative x 2.  Chest x-ray showing mild stable cardiomegaly with no evidence of pulmonary edema, no acute cardiopulmonary process.  Twelve-lead EKG with no evidence of acute ischemia.  Lab work with no evidence of acute kidney injury or end organ damage.  Sharon Swanson received a dose of IV labetalol  5 mg x 1 and IV Lasix  40 mg x 1 in [facility] ER with mild improvement of her blood pressure.  TRH, hospitalist service, was asked to admit for blood pressure control.  No focal neurological deficits on exam.  Sharon Swanson is alert and oriented x 3.  ED Course: Temperature 97.9.  BP 199/113, pulse 101, respiration rate 22, O2 saturation 94% on room air.  CMP essentially unremarkable except for elevated serum glucose 242.  CBC essentially unremarkable except for elevated hemoglobin 16.4.  Review of Systems: Review of systems as noted in Sharon HPI. All other systems reviewed and are negative.   Past Medical History:  Diagnosis Date   Abnormal  Pap smear of cervix 10/2020   Acid reflux    Allergy    Asthma    Atypical squamous cells of undetermined significance (ASC-US ) on cervical Pap smear 10/2020   Bilateral lower extremity edema 01/2020   COPD (chronic obstructive pulmonary disease) (HCC)    CPAP (continuous positive airway pressure) dependence    Diabetes (HCC)    Diabetes mellitus without complication (HCC)    Type II   Fatty liver 11/08/2011   Fibroids 11/08/2011   Hyperglycemia    Hyperlipidemia 05/2020   Hypertension    Microalbuminuria 01/2020   Obesity (BMI 30-39.9) 11/08/2011   Osteoarthritis of right acromioclavicular joint    Pancreatitis 11/08/2011   Rotator cuff tear, right    Shortness of breath on exertion 01/2020   Sleep apnea    Ventral hernia    Vitamin D  deficiency 05/2020   Past Surgical History:  Procedure Laterality Date   COLONOSCOPY     ORTHOPEDIC SURGERY     R ankle, tendonitis   UMBILICAL HERNIA REPAIR N/A 09/30/2021   Procedure: INCARCERATED HERNIA REPAIR UMBILICAL ADULT;  Surgeon: Lujean Sake, MD;  Location: [facility];  Service: General;  Laterality: N/A;    Social History:  reports that she has been smoking cigarettes. She started smoking about 43 years ago. She has a 4.3 pack-year smoking history. She has never used smokeless tobacco. She reports that she does not drink alcohol  and does not use drugs.   Allergies  Allergen Reactions   Aspirin  Other (See Comments)    Abdominal pain Abdominal pain Abdominal pain Abdominal pain Abdominal pain Abdominal pain Abdominal pain  Family History  Problem Relation Age of Onset   Hypertension Mother    Diabetes Maternal Grandmother    Hypertension Maternal Grandmother    Diabetes Other    Colon cancer Neg Hx    Esophageal cancer Neg Hx    Rectal cancer Neg Hx    Stomach cancer Neg Hx       Prior to Admission medications   Medication Sig Start Date End Date Taking? Authorizing Provider  acetaminophen  (TYLENOL ) 325 MG  tablet Take 2 tablets (650 mg total) by mouth every 6 (six) hours as needed. 03/06/22   Lovorn, Megan, MD  albuterol  (VENTOLIN  HFA) 108 (90 Base) MCG/ACT inhaler Inhale 2 puffs into Sharon lungs every 4 (four) hours as needed for wheezing or shortness of breath. 06/22/23   Jerrlyn Morel, NP  amLODipine  (NORVASC ) 10 MG tablet TAKE 1 TABLET BY MOUTH DAILY 06/29/23   Jerrlyn Morel, NP  aspirin  EC 81 MG tablet Take 1 tablet (81 mg total) by mouth daily. Swallow whole. 06/12/22   Gerald Kitty., NP  atorvastatin  (LIPITOR) 80 MG tablet TAKE 1 TABLET BY MOUTH DAILY 11/19/23   Paseda, Folashade R, FNP  carvedilol  (COREG ) 25 MG tablet Take 1 tablet (25 mg total) by mouth 2 (two) times daily. 06/29/23   Jerrlyn Morel, NP  cetirizine  (ZYRTEC ) 10 MG tablet Take 1 tablet (10 mg total) by mouth in Sharon morning. 03/21/21   Gregoria Leas, NP  Continuous Blood Gluc Receiver (FREESTYLE LIBRE 2 READER) DEVI Use as directed daily. 11/07/22   Paseda, Folashade R, FNP  Continuous Glucose Sensor (FREESTYLE LIBRE 2 SENSOR) MISC 1 application  by Does not apply route every 14 (fourteen) days. 06/29/23   Jerrlyn Morel, NP  DULoxetine  (CYMBALTA ) 60 MG capsule TAKE 1 CAPSULE BY MOUTH DAILY 06/22/23   Nichols, Tonya S, NP  fluticasone  (FLONASE ) 50 MCG/ACT nasal spray SHAKE LIQUID AND USE 1 SPRAY IN EACH NOSTRIL DAILY 01/10/23   Nichols, Tonya S, NP  furosemide  (LASIX ) 40 MG tablet TAKE 1 TABLET(40 MG) BY MOUTH TWICE DAILY 11/26/23   Nichols, Tonya S, NP  gabapentin  (NEURONTIN ) 300 MG capsule TAKE 1 CAPSULE BY MOUTH 3 TIMES  DAILY 06/22/23   Nichols, Tonya S, NP  insulin  glargine (LANTUS ) 100 UNIT/ML injection ADMINISTER 20 UNITS UNDER Sharon SKIN AT BEDTIME 07/27/23   Jerrlyn Morel, NP  insulin  lispro (HUMALOG ) 100 UNIT/ML injection Inject 0.1 mLs (10 Units total) into Sharon skin 2 (two) times daily with a meal. 06/29/23   Jerrlyn Morel, NP  ipratropium-albuterol  (DUONEB) 0.5-2.5 (3) MG/3ML SOLN Take 3 mLs by nebulization every 6  (six) hours as needed (shortness of breath, wheeze). 07/26/23 08/19/24  Jerrlyn Morel, NP  linaclotide  (LINZESS ) 145 MCG CAPS capsule Take 1 capsule (145 mcg total) by mouth daily before breakfast. 06/29/23   Nichols, Tonya S, NP  metFORMIN  (GLUCOPHAGE ) 1000 MG tablet Take 1 tablet (1,000 mg total) by mouth 2 (two) times daily with a meal. 06/29/23   Jerrlyn Morel, NP  ondansetron  (ZOFRAN ) 4 MG tablet Take 1 tablet (4 mg total) by mouth every 8 (eight) hours as needed for nausea or vomiting. 09/07/21   Lovorn, Megan, MD  sacubitril -valsartan  (ENTRESTO ) 97-103 MG Take 1 tablet by mouth 2 (two) times daily. 02/26/23   Paseda, Folashade R, FNP  Semaglutide ,0.25 or 0.5MG /DOS, (OZEMPIC , 0.25 OR 0.5 MG/DOSE,) 2 MG/3ML SOPN Inject 0.5 mg into Sharon skin once a week. 06/29/23   Jerrlyn Morel, NP  spironolactone  (ALDACTONE ) 50 MG tablet Take 1 tablet (50 mg total) by mouth daily. 06/22/23   Jerrlyn Morel, NP  tiZANidine  (ZANAFLEX ) 4 MG tablet Take 1 tablet (4 mg total) by mouth every 6 (six) hours as needed for muscle spasms. 01/18/23   Jerrlyn Morel, NP    Physical Exam: BP (!) 199/113   Pulse (!) 101   Temp 97.9 F (36.6 C) (Oral)   Resp (!) 22   Ht 5\' 7"  (1.702 m)   Wt 127 kg   SpO2 94%   BMI 43.85 kg/m   General: 63 y.o. year-old female well developed well nourished in no acute distress.  Alert and oriented x3. Cardiovascular: Regular rate and rhythm with no rubs or gallops.  No thyromegaly or JVD noted.  Trace edema in lower extremities bilaterally. Respiratory: Clear to auscultation with no wheezes or rales. Good inspiratory effort. Abdomen: Soft nontender nondistended with normal bowel sounds x4 quadrants. Muskuloskeletal: No cyanosis, clubbing or edema noted bilaterally Neuro: CN II-XII intact, strength, sensation, reflexes Skin: Hyperpigmentation involving right lower extremity which has been present for more than a year, per Sharon Swanson. Psychiatry: Judgement and insight appear  normal. Mood is appropriate for condition and setting          Labs on Admission:  Basic Metabolic Panel: Recent Labs  Lab 01/10/24 1819  NA 137  K 3.7  CL 99  CO2 29  GLUCOSE 242*  BUN 10  CREATININE 0.65  CALCIUM  8.9   Liver Function Tests: Recent Labs  Lab 01/10/24 1819  AST 27  ALT 28  ALKPHOS 65  BILITOT 0.6  PROT 7.1  ALBUMIN 3.6   No results for input(s): "LIPASE", "AMYLASE" in Sharon last 168 hours. No results for input(s): "AMMONIA" in Sharon last 168 hours. CBC: Recent Labs  Lab 01/10/24 1819  WBC 5.0  NEUTROABS 2.6  HGB 16.4*  HCT 51.0*  MCV 92.2  PLT 154   Cardiac Enzymes: No results for input(s): "CKTOTAL", "CKMB", "CKMBINDEX", "TROPONINI" in Sharon last 168 hours.  BNP (last 3 results) No results for input(s): "BNP" in Sharon last 8760 hours.  ProBNP (last 3 results) No results for input(s): "PROBNP" in Sharon last 8760 hours.  CBG: Recent Labs  Lab 01/10/24 1810  GLUCAP 257*    Radiological Exams on Admission: CT Head Wo Contrast Result Date: 01/10/2024 CLINICAL DATA:  Hypertension with headache. EXAM: CT HEAD WITHOUT CONTRAST TECHNIQUE: Contiguous axial images were obtained from Sharon base of Sharon skull through Sharon vertex without intravenous contrast. RADIATION DOSE REDUCTION: This exam was performed according to Sharon departmental dose-optimization program which includes automated exposure control, adjustment of the mA and/or kV according to Swanson size and/or use of iterative reconstruction technique. COMPARISON:  Head CT 08/29/2020. FINDINGS: Brain: No evidence of acute infarction, hemorrhage, hydrocephalus, extra-axial collection or mass lesion/mass effect. There is a mildly expanded partially empty sella, but it is unchanged in appearance. Vascular: Minimal calcific plaque both siphons. No hyperdense central vessels. Skull: Negative for fractures or focal lesions. Sinuses/Orbits: Mild chronic proptosis. Otherwise negative orbits. Clear visualized sinuses  and mastoid air cells. Other: None. IMPRESSION: 1. No acute intracranial CT findings or interval changes. 2. Mildly expanded partially empty sella, unchanged in appearance. 3. Mild chronic proptosis. Electronically Signed   By: Denman Fischer M.D.   On: 01/10/2024 20:22   DG Chest 2 View Result Date: 01/10/2024 CLINICAL DATA:  Shortness of breath. Elevated systolic blood pressure into Sharon 190 is. EXAM: CHEST - 2  VIEW COMPARISON:  12/30/2020 FINDINGS: Stable mild cardiomegaly. Aortic atherosclerotic calcification. No focal consolidation, pleural effusion, or pneumothorax. No displaced rib fractures. IMPRESSION: Mild stable cardiomegaly. Otherwise no acute cardiopulmonary process. Electronically Signed   By: Rozell Cornet M.D.   On: 01/10/2024 19:26    EKG: I independently viewed Sharon EKG done and my findings are as followed: Sinus rhythm rate of 95.  Nonspecific ST-T changes.  QTc 449.  Assessment/Plan Present on Admission:  Hypertensive urgency  Principal Problem:   Hypertensive urgency  Hypertensive urgency Presented with BP 222/123 Sharon Swanson is on multiple blood pressure medications, resume Titrate home oral antihypertensives as indicated. IV labetalol  as needed with parameters Closely monitor vital signs May benefit from referral to hypertension clinic with Dr. Theodis Fiscal.  Progressive dyspnea with minimal exertion Can only walk a few feet without feeling significantly short of breath Rule out cardiac structural abnormality Follow 2D echo  OSA, intolerant of CPAP, and noncompliant with CPAP Follow-up 2D echo to rule out pulmonary hypertension or right heart failure Sharon Swanson declines using CPAP  Chronic HFpEF Trace bilateral lower extremity edema on exam. No evidence of pulmonary edema on chest x-ray Last 2D echo done on 02/24/2021 revealed LVEF 60 to 65% Follow repeat 2D echo Monitor strict I's and O's and daily weight  Morbid obesity BMI 43 Recommend weight loss  outpatient with regular physical activity and healthy dieting.  Chronic lower extremity edema Resume home diuretics Elevate lower extremities  Type 2 diabetes with hyperglycemia Last hemoglobin A1c 9.8 on 07/10/2023 Obtain hemoglobin A1c Start basal insulin  and short acting insulin  Heart healthy carb modified diet  Generalized weakness PT OT assessment Fall precautions.   Time: 75 minutes.   DVT prophylaxis: Subcu Lovenox  daily  Code Status: Full code  Family Communication: None at bedside.  Disposition Plan: Admitted to telemetry unit.  Consults called: None.  Admission status: Observation status.   Status is: Observation    Sharon Boss MD [facility] Pager [phone]  If 7PM-7AM, please contact night-coverage [email or web address] Password [facility]  01/10/2024, 9:26 PM

## 2024-01-11 ENCOUNTER — Observation Stay (HOSPITAL_BASED_OUTPATIENT_CLINIC_OR_DEPARTMENT_OTHER)

## 2024-01-11 DIAGNOSIS — R0609 Other forms of dyspnea: Secondary | ICD-10-CM

## 2024-01-11 DIAGNOSIS — I160 Hypertensive urgency: Secondary | ICD-10-CM | POA: Diagnosis not present

## 2024-01-11 LAB — CBC
HCT: 54.3 % — ABNORMAL HIGH (ref 36.0–46.0)
Hemoglobin: 16.8 g/dL — ABNORMAL HIGH (ref 12.0–15.0)
MCH: 29.1 pg (ref 26.0–34.0)
MCHC: 30.9 g/dL (ref 30.0–36.0)
MCV: 93.9 fL (ref 80.0–100.0)
Platelets: 169 10*3/uL (ref 150–400)
RBC: 5.78 MIL/uL — ABNORMAL HIGH (ref 3.87–5.11)
RDW: 13.3 % (ref 11.5–15.5)
WBC: 5.2 10*3/uL (ref 4.0–10.5)
nRBC: 0 % (ref 0.0–0.2)

## 2024-01-11 LAB — GLUCOSE, CAPILLARY
Glucose-Capillary: 347 mg/dL — ABNORMAL HIGH (ref 70–99)
Glucose-Capillary: 312 mg/dL — ABNORMAL HIGH (ref 70–99)
Glucose-Capillary: 351 mg/dL — ABNORMAL HIGH (ref 70–99)
Glucose-Capillary: 298 mg/dL — ABNORMAL HIGH (ref 70–99)

## 2024-01-11 LAB — PHOSPHORUS: Phosphorus: 3.8 mg/dL (ref 2.5–4.6)

## 2024-01-11 LAB — BASIC METABOLIC PANEL WITH GFR
Anion gap: 11 (ref 5–15)
BUN: 12 mg/dL (ref 8–23)
CO2: 26 mmol/L (ref 22–32)
Calcium: 9.3 mg/dL (ref 8.9–10.3)
Chloride: 97 mmol/L — ABNORMAL LOW (ref 98–111)
Creatinine, Ser: 0.68 mg/dL (ref 0.44–1.00)
GFR, Estimated: >60 mL/min (ref >60)
Glucose, Bld: 325 mg/dL — ABNORMAL HIGH (ref 70–99)
Potassium: 4.4 mmol/L (ref 3.5–5.1)
Sodium: 134 mmol/L — ABNORMAL LOW (ref 135–145)

## 2024-01-11 LAB — MAGNESIUM: Magnesium: 1.7 mg/dL (ref 1.7–2.4)

## 2024-01-11 LAB — ECHOCARDIOGRAM COMPLETE
Area-P 1/2: 3.6 cm2
Height: 67 in
S' Lateral: 2.7 cm
Weight: 4649.06 [oz_av]

## 2024-01-11 LAB — HIV ANTIBODY (ROUTINE TESTING W REFLEX): HIV Screen 4th Generation wRfx: NONREACTIVE

## 2024-01-11 MED ORDER — INSULIN GLARGINE-YFGN 100 UNIT/ML ~~LOC~~ SOLN
10.0000 [IU] | Freq: Two times a day (BID) | SUBCUTANEOUS | Status: DC
  Administered 2024-01-11 – 2024-01-12 (×3): 10 [IU] via SUBCUTANEOUS
  Filled 2024-01-11 (×4): qty 0.1

## 2024-01-11 MED ORDER — GUAIFENESIN ER 600 MG PO TB12
600.0000 mg | ORAL_TABLET | Freq: Two times a day (BID) | ORAL | Status: DC | PRN
  Administered 2024-01-11: 600 mg via ORAL
  Filled 2024-01-11: qty 1

## 2024-01-11 MED ORDER — CETIRIZINE HCL 10 MG PO TABS: 10.0000 mg | ORAL_TABLET | Freq: Every day | ORAL | Status: AC | PRN

## 2024-01-11 MED ORDER — IPRATROPIUM-ALBUTEROL 0.5-2.5 (3) MG/3ML IN SOLN
3.0000 mL | Freq: Three times a day (TID) | RESPIRATORY_TRACT | Status: DC
  Administered 2024-01-12: 3 mL via RESPIRATORY_TRACT
  Filled 2024-01-11: qty 3

## 2024-01-11 MED ORDER — FUROSEMIDE 40 MG PO TABS
40.0000 mg | ORAL_TABLET | Freq: Two times a day (BID) | ORAL | Status: DC
  Administered 2024-01-11 – 2024-01-12 (×2): 40 mg via ORAL
  Filled 2024-01-11 (×2): qty 1

## 2024-01-11 NOTE — Progress Notes (Signed)
 PROGRESS NOTE    Sharon Swanson  ZOX:[id number] DOB: [birth year] DOA: 01/10/2024 PCP: Jerrlyn Morel, NP   Brief Narrative:  63 y.o. female with medical history significant for morbid obesity with BMI of 43, uncontrolled type 2 diabetes, hyperlipidemia, hypertension, GERD, asthma, chronic bilateral lower extremity edema, COPD, OSA intolerant of CPAP, chronic HFpEF presented with extremely elevated blood pressure.  In the ED, blood pressure was 222/123.  High sensitive troponins x 2 were negative.  Chest x-ray showed mild stable cardiomegaly with no pulmonary edema.  EKG showed no evidence of ischemia.  She was given IV labetalol  and IV Lasix .   Assessment & Plan:   Hypertensive urgency Presented with BP 222/123 The patient is on multiple blood pressure medications Patient received IV labetalol  and IV Lasix  on presentation. Blood pressure improving but still intermittently on the high side.  Continue home regimen including amlodipine , Coreg , Entresto , spironolactone  and Lasix .  Outpatient follow-up with PCP and cardiology.   Progressive dyspnea with minimal exertion Chronic diastolic heart failure - Possibly from obstructive sleep apnea and patient's noncompliance to CPAP. - Currently not volume overloaded.  Strict input and output.  Daily weights.  Continue diet and fluid restriction.  Follow 2D echo.  Continue Coreg , Entresto , spironolactone , Lasix .  Outpatient follow-up with cardiology.   OSA, intolerant of CPAP, and noncompliant with CPAP -Outpatient follow-up with PCP and/for pulmonary   Morbid obesity/obesity class III -Outpatient follow-up   Chronic lower extremity edema Continue home diuretics Elevate lower extremities   Type 2 diabetes with hyperglycemia Last hemoglobin A1c 9.8 on 07/10/2023 Continue long-acting insulin  along with CBGs with SSI.  Carb modified diet.  Outpatient follow-up with PCP      DVT prophylaxis: Lovenox  Code Status: Full Family Communication:  None at bedside Disposition Plan: Status is: Observation The patient will require care spanning > 2 midnights and should be moved to inpatient because: Of persistent hypoxia and elevated blood pressure    Consultants: None  Procedures: None  Antimicrobials:  None  Subjective: Patient seen and examined at bedside.  Feels slightly better but still short of breath with exertion.  No chest pain, vomiting reported.  Objective: Vitals:   01/11/24 1316 01/11/24 1349 01/11/24 1350 01/11/24 1526  BP: (!) 171/96 (!) 152/76 (!) 142/78 (!) 150/71  Pulse: 82  84 79  Resp: 20   20  Temp: 98.7 F (37.1 C)     TempSrc: Oral     SpO2: 97%     Weight:      Height:        Intake/Output Summary (Last 24 hours) at 01/11/2024 1534 Last data filed at 01/11/2024 8119 Gross per 24 hour  Intake 370 ml  Output 1800 ml  Net -1430 ml   Filed Weights   01/10/24 1625 01/10/24 2239  Weight: 127 kg 131.8 kg    Examination:  General exam: Appears calm and comfortable.  On 2 L oxygen  via nasal cannula Respiratory system: Bilateral decreased breath sounds at bases with scattered crackles Cardiovascular system: S1 & S2 heard, Rate controlled Gastrointestinal system: Abdomen is morbidly obese, nondistended, soft and nontender. Normal bowel sounds heard. Extremities: No cyanosis, clubbing; bilateral lower extremity edema present  Central nervous system: Alert and oriented. No focal neurological deficits. Moving extremities Skin: No rashes, lesions or ulcers Psychiatry: Flat affect.  Not agitated.  Data Reviewed: I have personally reviewed following labs and imaging studies  CBC: Recent Labs  Lab 01/10/24 1819 01/11/24 0606  WBC 5.0 5.2  NEUTROABS 2.6  --  HGB 16.4* 16.8*  HCT 51.0* 54.3*  MCV 92.2 93.9  PLT 154 169   Basic Metabolic Panel: Recent Labs  Lab 01/10/24 1819 01/11/24 0606  NA 137 134*  K 3.7 4.4  CL 99 97*  CO2 29 26  GLUCOSE 242* 325*  BUN 10 12  CREATININE 0.65  0.68  CALCIUM  8.9 9.3  MG  --  1.7  PHOS  --  3.8   GFR: Estimated Creatinine Clearance: 103.2 mL/min (by C-G formula based on SCr of 0.68 mg/dL). Liver Function Tests: Recent Labs  Lab 01/10/24 1819  AST 27  ALT 28  ALKPHOS 65  BILITOT 0.6  PROT 7.1  ALBUMIN 3.6   No results for input(s): "LIPASE", "AMYLASE" in the last 168 hours. No results for input(s): "AMMONIA" in the last 168 hours. Coagulation Profile: No results for input(s): "INR", "PROTIME" in the last 168 hours. Cardiac Enzymes: No results for input(s): "CKTOTAL", "CKMB", "CKMBINDEX", "TROPONINI" in the last 168 hours. BNP (last 3 results) No results for input(s): "PROBNP" in the last 8760 hours. HbA1C: No results for input(s): "HGBA1C" in the last 72 hours. CBG: Recent Labs  Lab 01/10/24 1810 01/10/24 2300 01/11/24 0745 01/11/24 1130  GLUCAP 257* 329* 347* 312*   Lipid Profile: No results for input(s): "CHOL", "HDL", "LDLCALC", "TRIG", "CHOLHDL", "LDLDIRECT" in the last 72 hours. Thyroid  Function Tests: No results for input(s): "TSH", "T4TOTAL", "FREET4", "T3FREE", "THYROIDAB" in the last 72 hours. Anemia Panel: No results for input(s): "VITAMINB12", "FOLATE", "FERRITIN", "TIBC", "IRON", "RETICCTPCT" in the last 72 hours. Sepsis Labs: No results for input(s): "PROCALCITON", "LATICACIDVEN" in the last 168 hours.  No results found for this or any previous visit (from the past 240 hours).       Radiology Studies: CT Head Wo Contrast Result Date: 01/10/2024 CLINICAL DATA:  Hypertension with headache. EXAM: CT HEAD WITHOUT CONTRAST TECHNIQUE: Contiguous axial images were obtained from the base of the skull through the vertex without intravenous contrast. RADIATION DOSE REDUCTION: This exam was performed according to the departmental dose-optimization program which includes automated exposure control, adjustment of the mA and/or kV according to patient size and/or use of iterative reconstruction technique.  COMPARISON:  Head CT 08/29/2020. FINDINGS: Brain: No evidence of acute infarction, hemorrhage, hydrocephalus, extra-axial collection or mass lesion/mass effect. There is a mildly expanded partially empty sella, but it is unchanged in appearance. Vascular: Minimal calcific plaque both siphons. No hyperdense central vessels. Skull: Negative for fractures or focal lesions. Sinuses/Orbits: Mild chronic proptosis. Otherwise negative orbits. Clear visualized sinuses and mastoid air cells. Other: None. IMPRESSION: 1. No acute intracranial CT findings or interval changes. 2. Mildly expanded partially empty sella, unchanged in appearance. 3. Mild chronic proptosis. Electronically Signed   By: Denman Fischer M.D.   On: 01/10/2024 20:22   DG Chest 2 View Result Date: 01/10/2024 CLINICAL DATA:  Shortness of breath. Elevated systolic blood pressure into the 190 is. EXAM: CHEST - 2 VIEW COMPARISON:  12/30/2020 FINDINGS: Stable mild cardiomegaly. Aortic atherosclerotic calcification. No focal consolidation, pleural effusion, or pneumothorax. No displaced rib fractures. IMPRESSION: Mild stable cardiomegaly. Otherwise no acute cardiopulmonary process. Electronically Signed   By: Rozell Cornet M.D.   On: 01/10/2024 19:26        Scheduled Meds:  amLODipine   10 mg Oral Daily   atorvastatin   80 mg Oral Daily   carvedilol   25 mg Oral BID   enoxaparin  (LOVENOX ) injection  40 mg Subcutaneous Q24H   furosemide   40 mg Oral BID  insulin  aspart  0-5 Units Subcutaneous QHS   insulin  aspart  0-9 Units Subcutaneous TID WC   insulin  glargine-yfgn  10 Units Subcutaneous BID   pneumococcal 20-valent conjugate vaccine  0.5 mL Intramuscular Tomorrow-1000   sacubitril -valsartan   1 tablet Oral BID   spironolactone   50 mg Oral Daily   Continuous Infusions:        Audria Leather, MD [facility] 01/11/2024, 3:34 PM

## 2024-01-11 NOTE — Evaluation (Signed)
 Occupational Therapy Evaluation Patient Details Name: Jalacia Mattila MRN: [id number] DOB: 05-23-1961 Today's Date: 01/11/2024   History of Present Illness   Nikeya Maxim is a 63 yr old female , presents to the ER 01/10/24 at the recommendation of her home health specialist due to persistent uncontrolled blood pressure   despite compliance with her home blood pressure medications, dyspnea with minimal exertion . PMH: morbid obesity with BMI of 43, uncontrolled type 2 diabetes, hyperlipidemia, hypertension, GERD, asthma, chronic bilateral lower extremity edema, COPD, OSA intolerant of CPAP, chronic HFpEF,     Clinical Impressions The pt is currently presenting slightly below her baseline level of functioning for self-care management. She is presenting with the below listed deficits (see OT problem list), and requiring SBA to CGA for most tasks, including toileting at bathroom level, sit to stand using a RW, and lower body dressing. She reports feelings of increased weakness, abdominal pain, and decreased endurance. She will benefit from further OT services in the hospital setting to maximize her independence with self-care tasks and to facilitate her safe return home. Further education on energy conservation strategies and general safety during functional tasks is recommended. Anticipate no post-acute therapy needs.       If plan is discharge home, recommend the following:   Help with stairs or ramp for entrance;Assist for transportation     Functional Status Assessment   Patient has had a recent decline in their functional status and demonstrates the ability to make significant improvements in function in a reasonable and predictable amount of time.     Equipment Recommendations   Tub/shower seat     Recommendations for Other Services         Precautions/Restrictions   Precautions Precaution/Restrictions Comments: monitor blood pressure Restrictions Weight Bearing  Restrictions Per Provider Order: No     Mobility Bed Mobility      General bed mobility comments: she was received seated in the bedside chair    Transfers Overall transfer level: Needs assistance Equipment used: Rolling walker (2 wheels) Transfers: Sit to/from Stand Sit to Stand: Supervision                  Balance     Sitting balance-Leahy Scale: Good       Standing balance-Leahy Scale: Fair     ADL either performed or assessed with clinical judgement   ADL Overall ADL's : Needs assistance/impaired Eating/Feeding: Independent;Sitting   Grooming: Set up;Sitting           Upper Body Dressing : Set up;Sitting   Lower Body Dressing: Contact guard assist;Sitting/lateral leans   Toilet Transfer: Contact guard assist;Rolling walker (2 wheels);Ambulation Toilet Transfer Details (indicate cue type and reason): performed at bathroom level Toileting- Clothing Manipulation and Hygiene: Contact guard assist               Vision   Additional Comments: She correctly read the time depicted on the wall clock.            Pertinent Vitals/Pain Pain Assessment Pain Assessment: 0-10 Pain Score: 7  Pain Location: abdomen Pain Intervention(s): Monitored during session, Repositioned     Extremity/Trunk Assessment Upper Extremity Assessment Upper Extremity Assessment: Overall WFL for tasks assessed;Right hand dominant   Lower Extremity Assessment Lower Extremity Assessment: Overall WFL for tasks assessed      Communication Communication Communication: No apparent difficulties   Cognition Arousal: Alert Behavior During Therapy: [facility] for tasks assessed/performed  OT - Cognition Comments: Oriented x4                 Following commands: Intact                  Home Living Family/patient expects to be discharged to:: Private residence Living Arrangements: Spouse/significant other Available Help at Discharge:  Family Type of Home: Mobile home Home Access: Stairs to enter Entrance Stairs-Number of Steps: 5 Entrance Stairs-Rails: Right;Left;Can reach both Home Layout: One level     Bathroom Shower/Tub: Tub/shower unit;Walk-in shower   Bathroom Toilet: Handicapped height     Home Equipment: Rollator (4 wheels)          Prior Functioning/Environment Prior Level of Function : Independent/Modified Independent             Mobility Comments:  (Occasional use of a rollator) ADLs Comments:  (She was independent with ADLs and shared the cooking and cleaning with her spouse. She does not drive.)    OT Problem List: Decreased safety awareness;Decreased activity tolerance;Pain;Impaired balance (sitting and/or standing)   OT Treatment/Interventions: Self-care/ADL training;Therapeutic exercise;Energy conservation;Patient/family education;Balance training;Therapeutic activities;DME and/or AE instruction      OT Goals(Current goals can be found in the care plan section)   Acute Rehab OT Goals OT Goal Formulation: With patient Time For Goal Achievement: 01/25/24 Potential to Achieve Goals: Good ADL Goals Pt Will Perform Grooming: with modified independence;standing Pt Will Perform Lower Body Dressing: with modified independence;sit to/from stand Pt Will Transfer to Toilet: with modified independence;ambulating Pt Will Perform Toileting - Clothing Manipulation and hygiene: with modified independence;sit to/from stand   OT Frequency:  Min 1X/week       AM-PAC OT "6 Clicks" Daily Activity     Outcome Measure Help from another person eating meals?: None Help from another person taking care of personal grooming?: None Help from another person toileting, which includes using toliet, bedpan, or urinal?: A Little Help from another person bathing (including washing, rinsing, drying)?: A Little Help from another person to put on and taking off regular upper body clothing?: None Help from  another person to put on and taking off regular lower body clothing?: A Little 6 Click Score: 21   End of Session Equipment Utilized During Treatment: Rolling walker (2 wheels) Nurse Communication: Mobility status  Activity Tolerance: Patient tolerated treatment well Patient left: in chair;with call bell/phone within reach  OT Visit Diagnosis: Pain;Unsteadiness on feet (R26.81) Pain - part of body:  (abdomen)                Time: 1340-1410 OT Time Calculation (min): 30 min Charges:  OT General Charges $OT Visit: 1 Visit OT Evaluation $OT Eval Moderate Complexity: 1 Mod    Bela Bonaparte L Rucker Pr[place]on, OTR/L 01/11/2024, 2:33 PM

## 2024-01-11 NOTE — Evaluation (Signed)
 Physical Therapy Evaluation Patient Details Name: Sharon Swanson MRN: [id number] DOB: July 24, 1961 Today's Date: 01/11/2024  History of Present Illness  Sharon Swanson is a 63 y.o. female , presents to the ER 01/10/24 at the recommendation of her home health specialist due to persistent uncontrolled blood pressure   despite compliance with her home blood pressure medications, dyspnea with minimal exertion . PMH: morbid obesity with BMI of 43, uncontrolled type 2 diabetes, hyperlipidemia, hypertension, GERD, asthma, chronic bilateral lower extremity edema, COPD, OSA intolerant of CPAP, chronic HFpEF,  Clinical Impression  Pt admitted with above diagnosis.  Pt currently with functional limitations due to the deficits listed below (see PT Problem List). Pt will benefit from acute skilled PT to increase their independence and safety with mobility to allow discharge.     The patient reports having a lot of gas. Patient  stood from recliner and braced self on the bed. Provided a RW to ambulate to BR,  appeared quite weak. After toileting patient standing to wash hands, propped self on wall and stated that she felt very weak. Patient  able to stand  and complete task, stopped again and rested against wall . Patient then slowly ambulated back to recliner using RW. Patient encouraged to call for assistance to ambulate in room.   BP 144/73, SPO2 on RA 955. HR 89.  Patient  should progress to return home with family support.      If plan is discharge home, recommend the following: A little help with walking and/or transfers;A little help with bathing/dressing/bathroom;Assistance with cooking/housework;Assist for transportation;Help with stairs or ramp for entrance   Can travel by private vehicle        Equipment Recommendations  none  Recommendations for Other Services       Functional Status Assessment Patient has had a recent decline in their functional status and demonstrates the ability to make  significant improvements in function in a reasonable and predictable amount of time.     Precautions / Restrictions Precautions Precautions: Fall Precaution/Restrictions Comments: monitor BP, sats Restrictions Weight Bearing Restrictions Per Provider Order: No      Mobility  Bed Mobility               General bed mobility comments: in recliner    Transfers Overall transfer level: Needs assistance Equipment used: Rolling walker (2 wheels) Transfers: Sit to/from Stand Sit to Stand: Supervision                Ambulation/Gait Ambulation/Gait assistance: Supervision Gait Distance (Feet):  (x 2) Assistive device: Rolling walker (2 wheels) Gait Pattern/deviations: Step-through pattern Gait velocity: decr     General Gait Details: After amb to BR and toileting, the patient standing and propping on the wall, slowly able to wash hands. stopped again, slow to ambuklate back to Best boy     Tilt Bed    Modified Rankin (Stroke Patients Only)       Balance Overall balance assessment: Needs assistance Sitting-balance support: Feet supported, No upper extremity supported Sitting balance-Leahy Scale: Good     Standing balance support: During functional activity, No upper extremity supported Standing balance-Leahy Scale: Poor Standing balance comment: reliant on RW or wall                             Pertinent Vitals/Pain Pain Assessment Pain Assessment: No/denies pain Pain Score:  7  Pain Location: abdomen    Home Living Family/patient expects to be discharged to:: Private residence Living Arrangements: Spouse/significant other Available Help at Discharge: Family Type of Home: Mobile home Home Access: Stairs to enter Entrance Stairs-Rails: Right;Left;Can reach both Entrance Stairs-Number of Steps: 5   Home Layout: One level Home Equipment: Rollator (4 wheels)      Prior Function Prior Level  of Function : Independent/Modified Independent                     Extremity/Trunk Assessment   Upper Extremity Assessment Upper Extremity Assessment: Overall WFL for tasks assessed    Lower Extremity Assessment Lower Extremity Assessment: Generalized weakness    Cervical / Trunk Assessment Cervical / Trunk Assessment: Normal  Communication   Communication Communication: No apparent difficulties    Cognition Arousal: Alert Behavior During Therapy: WFL for tasks assessed/performed   PT - Cognitive impairments: No apparent impairments                         Following commands: Intact       Cueing       General Comments      Exercises     Assessment/Plan    PT Assessment Patient needs continued PT services  PT Problem List Decreased strength;Decreased mobility;Decreased activity tolerance       PT Treatment Interventions DME instruction;Therapeutic exercise    PT Goals (Current goals can be found in the Care Plan section)  Acute Rehab PT Goals Patient Stated Goal: to go home PT Goal Formulation: With patient Time For Goal Achievement: 01/25/24 Potential to Achieve Goals: Good    Frequency Min 3X/week     Co-evaluation               AM-PAC PT "6 Clicks" Mobility  Outcome Measure Help needed turning from your back to your side while in a flat bed without using bedrails?: None Help needed moving from lying on your back to sitting on the side of a flat bed without using bedrails?: None Help needed moving to and from a bed to a chair (including a wheelchair)?: A Little Help needed standing up from a chair using your arms (e.g., wheelchair or bedside chair)?: A Little Help needed to walk in hospital room?: A Little Help needed climbing 3-5 steps with a railing? : A Lot 6 Click Score: 19    End of Session   Activity Tolerance: Patient limited by fatigue Patient left: in chair;with call bell/phone within reach;with chair alarm  set Nurse Communication: Mobility status PT Visit Diagnosis: Unsteadiness on feet (R26.81);Difficulty in walking, not elsewhere classified (R26.2)    Time: 1610-9604 PT Time Calculation (min) (ACUTE ONLY): 32 min   Charges:   PT Evaluation $PT Eval Low Complexity: 1 Low PT Treatments $Gait Training: 8-22 mins PT General Charges $$ ACUTE PT VISIT: 1 Visit         Abelina Hoes PT Acute Rehabilitation Services Office [phone] Weekend [phone]   Dareen Ebbing 01/11/2024, 1:10 PM

## 2024-01-11 NOTE — Progress Notes (Signed)
   01/11/24 2200  BiPAP/CPAP/SIPAP  BiPAP/CPAP/SIPAP Pt Type Adult  Reason BIPAP/CPAP not in use Non-compliant (patient does not want to wear one. she does not wear one at home due to unable to tolerate)  BiPAP/CPAP /SiPAP Vitals  Resp (!) 22  MEWS Score/Color  MEWS Score 1  MEWS Score Color [place]

## 2024-01-11 NOTE — Progress Notes (Signed)
  Echocardiogram 2D Echocardiogram has been performed.  Fain Home RDCS 01/11/2024, 3:27 PM

## 2024-01-11 NOTE — Plan of Care (Signed)

## 2024-01-11 NOTE — Discharge Summary (Signed)
 Physician Discharge Summary  Sharon Swanson JYN:[id number] DOB: 10-15-1960 DOA: 01/10/2024  PCP: Jerrlyn Morel, NP  Admit date: 01/10/2024 Discharge date: 01/12/2024  Admitted From: Home Disposition: Home  Recommendations for Outpatient Follow-up:  Follow up with PCP in 1 week with repeat CBC/BMP Outpatient follow up with cardiology Comply with medications and follow up Follow up in ED if symptoms worsen or new appear   Home Health: No Equipment/Devices: None  Discharge Condition: Stable CODE STATUS: Full Diet recommendation: Heart healthy  Brief/Interim Summary: 63 y.o. female with medical history significant for morbid obesity with BMI of 43, uncontrolled type 2 diabetes, hyperlipidemia, hypertension, GERD, asthma, chronic bilateral lower extremity edema, COPD, OSA intolerant of CPAP, chronic HFpEF presented with extremely elevated blood pressure.  In the ED, blood pressure was 222/123.  High sensitive troponins x 2 were negative.  Chest x-ray showed mild stable cardiomegaly with no pulmonary edema.  EKG showed no evidence of ischemia.  She was given IV labetalol  and IV Lasix .  Her home antihypertensive regimen was resumed.  During the hospitalization, her blood pressure is improving.  She feels much better and feels okay to go home today.  She will be discharged home today with outpatient follow-up with PCP.  She needs to follow-up with cardiology at earliest convenience.  Discharge Diagnoses:   Hypertensive urgency Presented with BP 222/123 The patient is on multiple blood pressure medications Patient received IV labetalol  and IV Lasix  on presentation. Blood pressure has much improved.  Continue home regimen including amlodipine , Coreg , Entresto , spironolactone  and Lasix .  Outpatient follow-up with PCP and cardiology.   Progressive dyspnea with minimal exertion Chronic diastolic heart failure - Possibly from obstructive sleep apnea and patient's noncompliance to CPAP. -  Currently not volume overloaded.  Echo showed EF of 75%.  Continue diet and fluid restriction.  Continue Coreg , Entresto , spironolactone , Lasix .  Outpatient follow-up with cardiology. - Currently on room air  OSA, intolerant of CPAP, and noncompliant with CPAP -Outpatient follow-up with PCP and/for pulmonary  Morbid obesity/obesity class III -Outpatient follow-up   Chronic lower extremity edema Resume home diuretics Elevate lower extremities   Type 2 diabetes with hyperglycemia Last hemoglobin A1c 9.8 on 07/10/2023 Continue home regimen.  Carb modified diet.  Outpatient follow-up with PCP    Discharge Instructions   Allergies as of 01/11/2024       Reactions   Aspirin  Other (See Comments)   Abdominal pain Abdominal pain Abdominal pain Abdominal pain Abdominal pain Abdominal pain Abdominal pain        Medication List     TAKE these medications    acetaminophen  325 MG tablet Commonly known as: TYLENOL  Take 2 tablets (650 mg total) by mouth every 6 (six) hours as needed.   albuterol  108 (90 Base) MCG/ACT inhaler Commonly known as: VENTOLIN  HFA Inhale 2 puffs into the lungs every 4 (four) hours as needed for wheezing or shortness of breath.   amLODipine  10 MG tablet Commonly known as: NORVASC  TAKE 1 TABLET BY MOUTH DAILY   aspirin  EC 81 MG tablet Take 1 tablet (81 mg total) by mouth daily. Swallow whole.   atorvastatin  80 MG tablet Commonly known as: LIPITOR TAKE 1 TABLET BY MOUTH DAILY   carvedilol  25 MG tablet Commonly known as: COREG  Take 1 tablet (25 mg total) by mouth 2 (two) times daily.   cetirizine  10 MG tablet Commonly known as: ZYRTEC  Take 1 tablet (10 mg total) by mouth daily as needed for allergies.   DULoxetine  60 MG capsule  Commonly known as: CYMBALTA  TAKE 1 CAPSULE BY MOUTH DAILY   Entresto  97-103 MG Generic drug: sacubitril -valsartan  Take 1 tablet by mouth 2 (two) times daily.   fluticasone  50 MCG/ACT nasal spray Commonly known  as: FLONASE  SHAKE LIQUID AND USE 1 SPRAY IN EACH NOSTRIL DAILY What changed: See the new instructions.   FreeStyle [place] 2 Reader [place] Use as directed daily.   FreeStyle Libre 2 Sensor Misc 1 application  by Does not apply route every 14 (fourteen) days.   furosemide  40 MG tablet Commonly known as: LASIX  TAKE 1 TABLET(40 MG) BY MOUTH TWICE DAILY   gabapentin  300 MG capsule Commonly known as: NEURONTIN  TAKE 1 CAPSULE BY MOUTH 3 TIMES  DAILY   insulin  lispro 100 UNIT/ML injection Commonly known as: HumaLOG  Inject 0.1 mLs (10 Units total) into the skin 2 (two) times daily with a meal.   ipratropium-albuterol  0.5-2.5 (3) MG/3ML Soln Commonly known as: DUONEB Take 3 mLs by nebulization every 6 (six) hours as needed (shortness of breath, wheeze).   Lantus  100 UNIT/ML injection Generic drug: insulin  glargine ADMINISTER 20 UNITS UNDER THE SKIN AT BEDTIME What changed: See the new instructions.   linaclotide  145 MCG Caps capsule Commonly known as: Linzess  Take 1 capsule (145 mcg total) by mouth daily before breakfast.   metFORMIN  1000 MG tablet Commonly known as: GLUCOPHAGE  Take 1 tablet (1,000 mg total) by mouth 2 (two) times daily with a meal.   ondansetron  4 MG tablet Commonly known as: Zofran  Take 1 tablet (4 mg total) by mouth every 8 (eight) hours as needed for nausea or vomiting.   Ozempic  (0.25 or 0.5 MG/DOSE) 2 MG/3ML Sopn Generic drug: Semaglutide (0.25 or 0.5MG /DOS) Inject 0.5 mg into the skin once a week.   spironolactone  50 MG tablet Commonly known as: ALDACTONE  Take 1 tablet (50 mg total) by mouth daily.   tiZANidine  4 MG tablet Commonly known as: Zanaflex  Take 1 tablet (4 mg total) by mouth every 6 (six) hours as needed for muscle spasms.        Follow-up Information     Jerrlyn Morel, NP. Schedule an appointment as soon as possible for a visit in 1 week(s).   Specialties: Pulmonary Disease, Endocrinology Contact information: [address] [place] [address] [phone]                Allergies  Allergen Reactions   Aspirin  Other (See Comments)    Abdominal pain Abdominal pain Abdominal pain Abdominal pain Abdominal pain Abdominal pain Abdominal pain    Consultations: None   Procedures/Studies: CT Head Wo Contrast Result Date: 01/10/2024 CLINICAL DATA:  Hypertension with headache. EXAM: CT HEAD WITHOUT CONTRAST TECHNIQUE: Contiguous axial images were obtained from the base of the skull through the vertex without intravenous contrast. RADIATION DOSE REDUCTION: This exam was performed according to the departmental dose-optimization program which includes automated exposure control, adjustment of the mA and/or kV according to patient size and/or use of iterative reconstruction technique. COMPARISON:  Head CT 08/29/2020. FINDINGS: Brain: No evidence of acute infarction, hemorrhage, hydrocephalus, extra-axial collection or mass lesion/mass effect. There is a mildly expanded partially empty sella, but it is unchanged in appearance. Vascular: Minimal calcific plaque both siphons. No hyperdense central vessels. Skull: Negative for fractures or focal lesions. Sinuses/Orbits: Mild chronic proptosis. Otherwise negative orbits. Clear visualized sinuses and mastoid air cells. Other: None. IMPRESSION: 1. No acute intracranial CT findings or interval changes. 2. Mildly expanded partially empty sella, unchanged in appearance. 3.  Mild chronic proptosis. Electronically Signed   By: Denman Fischer M.D.   On: 01/10/2024 20:22   DG Chest 2 View Result Date: 01/10/2024 CLINICAL DATA:  Shortness of breath. Elevated systolic blood pressure into the 190 is. EXAM: CHEST - 2 VIEW COMPARISON:  12/30/2020 FINDINGS: Stable mild cardiomegaly. Aortic atherosclerotic calcification. No focal consolidation, pleural effusion, or pneumothorax. No displaced rib fractures. IMPRESSION: Mild stable cardiomegaly. Otherwise no acute  cardiopulmonary process. Electronically Signed   By: Rozell Cornet M.D.   On: 01/10/2024 19:26      Subjective: Patient seen and examined at bedside.  Feels better.  Feels okay to go home today.  No fever, vomiting or chest pain reported.  Discharge Exam: Vitals:   01/11/24 0848 01/11/24 1019  BP: (!) 169/86 127/82  Pulse: 79 93  Resp:  20  Temp: 98.4 F (36.9 C) 98.2 F (36.8 C)  SpO2: 97% 95%    General: Pt is alert, awake, not in acute distress.  Currently on room air. Cardiovascular: rate controlled, S1/S2 + Respiratory: bilateral decreased breath sounds at bases Abdominal: Soft, morbidly obese, NT, ND, bowel sounds + Extremities: Bilateral trace lower extremity edema present; no cyanosis    The results of significant diagnostics from this hospitalization (including imaging, microbiology, ancillary and laboratory) are listed below for reference.     Microbiology: No results found for this or any previous visit (from the past 240 hours).   Labs: BNP (last 3 results) Recent Labs    01/10/24 2058  BNP 59.2   Basic Metabolic Panel: Recent Labs  Lab 01/10/24 1819 01/11/24 0606  NA 137 134*  K 3.7 4.4  CL 99 97*  CO2 29 26  GLUCOSE 242* 325*  BUN 10 12  CREATININE 0.65 0.68  CALCIUM  8.9 9.3  MG  --  1.7  PHOS  --  3.8   Liver Function Tests: Recent Labs  Lab 01/10/24 1819  AST 27  ALT 28  ALKPHOS 65  BILITOT 0.6  PROT 7.1  ALBUMIN 3.6   No results for input(s): "LIPASE", "AMYLASE" in the last 168 hours. No results for input(s): "AMMONIA" in the last 168 hours. CBC: Recent Labs  Lab 01/10/24 1819 01/11/24 0606  WBC 5.0 5.2  NEUTROABS 2.6  --   HGB 16.4* 16.8*  HCT 51.0* 54.3*  MCV 92.2 93.9  PLT 154 169   Cardiac Enzymes: No results for input(s): "CKTOTAL", "CKMB", "CKMBINDEX", "TROPONINI" in the last 168 hours. BNP: Invalid input(s): "POCBNP" CBG: Recent Labs  Lab 01/10/24 1810 01/10/24 2300 01/11/24 0745  GLUCAP 257* 329*  347*   D-Dimer No results for input(s): "DDIMER" in the last 72 hours. Hgb A1c No results for input(s): "HGBA1C" in the last 72 hours. Lipid Profile No results for input(s): "CHOL", "HDL", "LDLCALC", "TRIG", "CHOLHDL", "LDLDIRECT" in the last 72 hours. Thyroid  function studies No results for input(s): "TSH", "T4TOTAL", "T3FREE", "THYROIDAB" in the last 72 hours.  Invalid input(s): "FREET3" Anemia work up No results for input(s): "VITAMINB12", "FOLATE", "FERRITIN", "TIBC", "IRON", "RETICCTPCT" in the last 72 hours. Urinalysis    Component Value Date/Time   COLORURINE STRAW (A) 01/10/2024 1750   APPEARANCEUR CLEAR 01/10/2024 1750   LABSPEC 1.008 01/10/2024 1750   PHURINE 7.0 01/10/2024 1750   GLUCOSEU >=500 (A) 01/10/2024 1750   GLUCOSEU NEGATIVE 08/12/2012 1516   HGBUR NEGATIVE 01/10/2024 1750   BILIRUBINUR NEGATIVE 01/10/2024 1750   BILIRUBINUR neg 10/04/2020 0847   KETONESUR NEGATIVE 01/10/2024 1750   PROTEINUR 100 (A) 01/10/2024 1750  UROBILINOGEN 0.2 10/04/2020 0847   UROBILINOGEN 1.0 08/17/2014 1522   NITRITE NEGATIVE 01/10/2024 1750   LEUKOCYTESUR NEGATIVE 01/10/2024 1750   Sepsis Labs Recent Labs  Lab 01/10/24 1819 01/11/24 0606  WBC 5.0 5.2   Microbiology No results found for this or any previous visit (from the past 240 hours).   Time coordinating discharge: 35 minutes  SIGNED:   Audria Leather, MD  [facility] 01/12/2024, 10:09 AM

## 2024-01-11 NOTE — Care Management Obs Status (Signed)
 MEDICARE OBSERVATION STATUS NOTIFICATION   Patient Details  Name: Sharon Swanson MRN: [id number] Date of Birth: 1961-01-18   Medicare Observation Status Notification Given:  Yes    Loreda Rodriguez, RN 01/11/2024, 3:53 PM

## 2024-01-12 DIAGNOSIS — I160 Hypertensive urgency: Secondary | ICD-10-CM | POA: Diagnosis not present

## 2024-01-12 LAB — GLUCOSE, CAPILLARY: Glucose-Capillary: 260 mg/dL — ABNORMAL HIGH (ref 70–99)

## 2024-01-12 NOTE — Progress Notes (Signed)
 Mobility Specialist - Progress Note   01/12/24 1003  Mobility  Activity Ambulated with assistance in hallway  Level of Assistance Modified independent, requires aide device or extra time  Assistive Device Front wheel walker  Distance Ambulated (ft) 275 ft  Activity Response Tolerated well  Mobility Referral Yes  Mobility visit 1 Mobility  Mobility Specialist Start Time (ACUTE ONLY) [id number]  Mobility Specialist Stop Time (ACUTE ONLY) 1003  Mobility Specialist Time Calculation (min) (ACUTE ONLY) 7 min   Pt received in bed and agreeable to mobility. No complaints during session. Pt to bed after session with all needs met.     [facility]

## 2024-01-12 NOTE — Plan of Care (Signed)

## 2024-01-12 NOTE — Plan of Care (Signed)
  Problem: Education: Goal: Ability to describe self-care measures that may prevent or decrease complications (Diabetes Survival Skills Education) will improve Outcome: Progressing Goal: Individualized Educational Video(s) Outcome: Progressing   Problem: Coping: Goal: Ability to adjust to condition or change in health will improve Outcome: Progressing   Problem: Fluid Volume: Goal: Ability to maintain a balanced intake and output will improve Outcome: Progressing   Problem: Health Behavior/Discharge Planning: Goal: Ability to identify and utilize available resources and services will improve Outcome: Progressing Goal: Ability to manage health-related needs will improve Outcome: Progressing   Problem: Metabolic: Goal: Ability to maintain appropriate glucose levels will improve Outcome: Progressing   Problem: Nutritional: Goal: Maintenance of adequate nutrition will improve Outcome: Progressing Goal: Progress toward achieving an optimal weight will improve Outcome: Progressing   Problem: Skin Integrity: Goal: Risk for impaired skin integrity will decrease Outcome: Progressing   Problem: Tissue Perfusion: Goal: Adequacy of tissue perfusion will improve Outcome: Progressing   Problem: Education: Goal: Knowledge of General Education information will improve Description: Including pain rating scale, medication(s)/side effects and non-pharmacologic comfort measures Outcome: Progressing   Problem: Health Behavior/Discharge Planning: Goal: Ability to manage health-related needs will improve Outcome: Progressing   Problem: Clinical Measurements: Goal: Ability to maintain clinical measurements within normal limits will improve Outcome: Progressing Goal: Will remain free from infection Outcome: Progressing Goal: Diagnostic test results will improve Outcome: Progressing Goal: Respiratory complications will improve Outcome: Progressing Goal: Cardiovascular complication will  be avoided Outcome: Progressing   Problem: Nutrition: Goal: Adequate nutrition will be maintained Outcome: Progressing   Problem: Coping: Goal: Level of anxiety will decrease Outcome: Progressing   Problem: Pain Managment: Goal: General experience of comfort will improve and/or be controlled Outcome: Progressing   Problem: Safety: Goal: Ability to remain free from injury will improve Outcome: Progressing   Problem: Skin Integrity: Goal: Risk for impaired skin integrity will decrease Outcome: Progressing

## 2024-01-14 LAB — HEMOGLOBIN A1C
Hgb A1c MFr Bld: 11.8 % — ABNORMAL HIGH (ref 4.8–5.6)
Mean Plasma Glucose: 292 mg/dL

## 2024-01-31 ENCOUNTER — Ambulatory Visit: Attending: Cardiology | Admitting: Cardiology

## 2024-02-15 ENCOUNTER — Other Ambulatory Visit: Payer: Self-pay

## 2024-02-15 DIAGNOSIS — R739 Hyperglycemia, unspecified: Secondary | ICD-10-CM

## 2024-02-15 DIAGNOSIS — R7309 Other abnormal glucose: Secondary | ICD-10-CM

## 2024-02-15 DIAGNOSIS — E1165 Type 2 diabetes mellitus with hyperglycemia: Secondary | ICD-10-CM

## 2024-03-06 NOTE — Progress Notes (Deleted)
 Cardiology Office Note:   Date:  03/06/2024  ID:  Sharon Swanson, DOB [birth year], MRN [id number] PCP: Jerrlyn Morel, NP  [facility] Providers Cardiologist:  Avery Bodo, MD {  History of Present Illness:   Sharon Swanson is a 63 y.o. female who presents for follow up of diastolic HF.  She was last seen in 2023.  She was in the hospital in 2025 with hypertensive urgency.  I reviewed these records for this visit.  She ***   Hypertensive urgency Presented with BP 222/123 The patient is on multiple blood pressure medications Patient received IV labetalol  and IV Lasix  on presentation. Blood pressure has much improved.  Continue home regimen including amlodipine , Coreg , Entresto , spironolactone  and Lasix .  Outpatient follow-up with PCP and cardiology.   Progressive dyspnea with minimal exertion Chronic diastolic heart failure - Possibly from obstructive sleep apnea and patient's noncompliance to CPAP. - Currently not volume overloaded.  Echo showed EF of 75%.  Continue diet and fluid restriction.  Continue Coreg , Entresto , spironolactone , Lasix .  Outpatient follow-up with cardiology. - Currently on room air   OSA, intolerant of CPAP, and noncompliant with CPAP -Outpatient follow-up with PCP and/for pulmonary   Morbid obesity/obesity class III -Outpatient follow-up   Chronic lower extremity edema Resume home diuretics Elevate lower extremities   Type 2 diabetes with hyperglycemia Last hemoglobin A1c 9.8 on 07/10/2023 Continue home regimen.  Carb modified diet.  Outpatient follow-up with PCP  ***  who is being seen today for the evaluation of LE edema at the request of Jenni Mody, NP.   Records from 07/2019 show: 63 year old female with history of hypertension, COPD, diabetes mellitus, IDDM, smoking, just quit presented to ED with 2 weeks of worsening shortness of breath.  Per patient, she had stopped taking her medication 2 weeks ago as she lost her insurance.   She started noticing shortness of breath, worse with exertion, also has not been able to sleep, has been sitting up on the couch for last 2 weeks.  She also noticed swelling in her both lower legs, worsening. Right lower leg was becoming more tight, tender and red.  Patient reported that she had a fever of 101 F prior to admission night with coughing.  She also reports chest tightness with exertion.  No nausea or vomiting, stated she had diarrhea week prior. No recent long distance travels.  Patient has been home due to work injury since April 2020. COVID-19 test negative BP 201/108 at the time of admission.   Since being hospitalized patient has been treated with IV Lasix  for diastolic heart failure exacerbation as well as 5 days antibiotics for CAP and right lower extremity cellulitis, both of which resolved.  Pulmonary was consulted on 11/12 due to persistent O2 requirements.  See below for further details   Patient was discharged in improving and stable condition on 11/17 with 2 L nasal cannula and plan to get CPAP at home.  Also had home appointment and primary care appointment.   Echo in 11/20 showed: Left ventricular ejection fraction, by visual estimation, is 60 to  65%. The left ventricle has normal function. There is moderately increased  left ventricular hypertrophy.   2. Global right ventricle has normal systolic function.The right  ventricular size is normal. No increase in right ventricular wall  thickness.   3. Left atrial size was mildly dilated.   4. Right atrial size was not well visualized.   5. The pericardial effusion is circumferential.   6.  Trivial pericardial effusion is present.   7. Mild aortic valve annular calcification.   8. The mitral valve is normal in structure. Trace mitral valve  regurgitation. No evidence of mitral stenosis.   9. The tricuspid valve is normal in structure. Tricuspid valve  regurgitation is not demonstrated.  10. The aortic valve is  normal in structure. Aortic valve regurgitation is  not visualized. No evidence of aortic valve sclerosis or stenosis.  11. There is Mild calcification of the aortic valve.  12. There is Mild thickening of the aortic valve.  13. The pulmonic valve was not well visualized. Pulmonic valve  regurgitation is not visualized.  14. The inferior vena cava is normal in size with greater than 50%  respiratory variability, suggesting right atrial pressure of 3 mmHg.  15. The interatrial septum was not well visualized.    She states she is taking her meds    ROS: ***  Studies Reviewed:    EKG:       ***  Risk Assessment/Calculations:   {Does this patient have ATRIAL FIBRILLATION?:[phone]} No BP recorded.  {Refresh Note OR Click here to enter BP  :1}***        Physical Exam:   VS:  There were no vitals taken for this visit.   Wt Readings from Last 3 Encounters:  01/10/24 290 lb 9.1 oz (131.8 kg)  07/13/23 291 lb 12.8 oz (132.4 kg)  06/29/23 293 lb 12.8 oz (133.3 kg)     GEN: Well nourished, well developed in no acute distress NECK: No JVD; No carotid bruits CARDIAC: ***RR, *** murmurs, rubs, gallops RESPIRATORY:  Clear to auscultation without rales, wheezing or rhonchi  ABDOMEN: Soft, non-tender, non-distended EXTREMITIES:  No edema; No deformity   ASSESSMENT AND PLAN:   Acute on chronic diastolic heart failure: ***  Mildly volume overloaded.  Increase Lasix  to 80 mg twice daily for 3 days.  Then go back to 40 mg twice daily.  Will check electrolytes next week.  Hypertensive heart disease: *** Moderate LVH noted on her echocardiogram.  Start carvedilol  6.25 mg twice daily.  If her blood pressure is still up at her next visit, would increase losartan .  Follow-up in the hypertension Pharm.D. clinic.  Tobacco abuse: *** She needs to stop smoking.  Morbid obesity: *** Whole food, plant-based diet recommended.  Lower extremity edema:  ***   Elevate her legs.  Hopefully, the  additional diuretic will help.  Sleep apnea:  ***      Follow up ***  Signed, Eilleen Grates, MD

## 2024-03-07 ENCOUNTER — Ambulatory Visit: Admitting: Cardiology

## 2024-03-07 DIAGNOSIS — G4730 Sleep apnea, unspecified: Secondary | ICD-10-CM

## 2024-03-07 DIAGNOSIS — I517 Cardiomegaly: Secondary | ICD-10-CM

## 2024-03-07 DIAGNOSIS — I5033 Acute on chronic diastolic (congestive) heart failure: Secondary | ICD-10-CM

## 2024-03-07 DIAGNOSIS — M7989 Other specified soft tissue disorders: Secondary | ICD-10-CM

## 2024-05-11 NOTE — Progress Notes (Deleted)
 Cardiology Office Note:   Date:  05/11/2024  ID:  Sharon Swanson, DOB July 11, 1961, MRN [id number] PCP: Oley Bascom RAMAN, NP  [facility] Providers Cardiologist:  Candyce Reek, MD {  History of Present Illness:   Sharon Swanson is a 63 y.o. female was seen previously by Dr. Reek.  This is my first visit with her.  She hsa hypertension, COPD, diabetes mellitus, IDDM, smoking.     ***, just quit presented to ED with 2 weeks of worsening shortness of breath.  Per patient, she had stopped taking her medication 2 weeks ago as she lost her insurance.  She started noticing shortness of breath, worse with exertion, also has not been able to sleep, has been sitting up on the couch for last 2 weeks.  She also noticed swelling in her both lower legs, worsening. Right lower leg was becoming more tight, tender and red.  Patient reported that she had a fever of 101 F prior to admission night with coughing.  She also reports chest tightness with exertion.  No nausea or vomiting, stated she had diarrhea week prior. No recent long distance travels.  Patient has been home due to work injury since April 2020. COVID-19 test negative BP 201/108 at the time of admission.   Since being hospitalized patient has been treated with IV Lasix  for diastolic heart failure exacerbation as well as 5 days antibiotics for CAP and right lower extremity cellulitis, both of which resolved.  Pulmonary was consulted on 11/12 due to persistent O2 requirements.  See below for further details   Patient was discharged in improving and stable condition on 11/17 with 2 L nasal cannula and plan to get CPAP at home.  Also had home appointment and primary care appointment.   Echo in 11/20 showed: Left ventricular ejection fraction, by visual estimation, is 60 to  65%. The left ventricle has normal function. There is moderately increased  left ventricular hypertrophy.   2. Global right ventricle has normal systolic  function.The right  ventricular size is normal. No increase in right ventricular wall  thickness.   3. Left atrial size was mildly dilated.   4. Right atrial size was not well visualized.   5. The pericardial effusion is circumferential.   6. Trivial pericardial effusion is present.   ROS: ***  Studies Reviewed:    EKG:       ***  Risk Assessment/Calculations:   {Does this patient have ATRIAL FIBRILLATION?:[id number]} No BP recorded.  {Refresh Note OR Click here to enter BP  :1}***        Physical Exam:   VS:  There were no vitals taken for this visit.   Wt Readings from Last 3 Encounters:  01/10/24 290 lb 9.1 oz (131.8 kg)  07/13/23 291 lb 12.8 oz (132.4 kg)  06/29/23 293 lb 12.8 oz (133.3 kg)     GEN: Well nourished, well developed in no acute distress NECK: No JVD; No carotid bruits CARDIAC: ***RR, *** murmurs, rubs, gallops RESPIRATORY:  Clear to auscultation without rales, wheezing or rhonchi  ABDOMEN: Soft, non-tender, non-distended EXTREMITIES:  No edema; No deformity   ASSESSMENT AND PLAN:   Chronic diastolic heart failure: ****Mildly volume overloaded.  Increase Lasix  to 80 mg twice daily for 3 days.  Then go back to 40 mg twice daily.  Will check electrolytes next week.  Hypertensive heart disease: ***  Moderate LVH noted on her echocardiogram.  Start carvedilol  6.25 mg twice daily.  If her blood pressure is  still up at her next visit, would increase losartan .  Follow-up in the hypertension Pharm.D. clinic.  Tobacco abuse: ***  She needs to stop smoking.  Morbid obesity: ***  Whole food, plant-based diet recommended.  Lower extremity edema: *** Elevate her legs.  Hopefully, the additional diuretic will help.    Sleep apnea:  ***   Follow up ***  Signed, Lynwood Schilling, MD

## 2024-05-14 ENCOUNTER — Ambulatory Visit: Attending: Cardiology | Admitting: Cardiology

## 2024-05-14 DIAGNOSIS — M7989 Other specified soft tissue disorders: Secondary | ICD-10-CM

## 2024-05-14 DIAGNOSIS — I5032 Chronic diastolic (congestive) heart failure: Secondary | ICD-10-CM

## 2024-05-14 DIAGNOSIS — I10 Essential (primary) hypertension: Secondary | ICD-10-CM

## 2024-07-29 DIAGNOSIS — Z5982 Transportation insecurity: Secondary | ICD-10-CM | POA: Diagnosis not present

## 2024-07-29 DIAGNOSIS — Z720 Tobacco use: Secondary | ICD-10-CM | POA: Diagnosis not present

## 2024-07-29 DIAGNOSIS — I110 Hypertensive heart disease with heart failure: Secondary | ICD-10-CM | POA: Diagnosis not present

## 2024-07-29 DIAGNOSIS — E1142 Type 2 diabetes mellitus with diabetic polyneuropathy: Secondary | ICD-10-CM | POA: Diagnosis not present

## 2024-07-29 DIAGNOSIS — F411 Generalized anxiety disorder: Secondary | ICD-10-CM | POA: Diagnosis not present

## 2024-07-29 DIAGNOSIS — Z6841 Body Mass Index (BMI) 40.0 and over, adult: Secondary | ICD-10-CM | POA: Diagnosis not present

## 2024-07-29 DIAGNOSIS — K5900 Constipation, unspecified: Secondary | ICD-10-CM | POA: Diagnosis not present

## 2024-07-29 DIAGNOSIS — Z794 Long term (current) use of insulin: Secondary | ICD-10-CM | POA: Diagnosis not present

## 2024-07-29 DIAGNOSIS — Z9989 Dependence on other enabling machines and devices: Secondary | ICD-10-CM | POA: Diagnosis not present

## 2024-07-29 DIAGNOSIS — I509 Heart failure, unspecified: Secondary | ICD-10-CM | POA: Diagnosis not present

## 2024-07-29 DIAGNOSIS — Z7982 Long term (current) use of aspirin: Secondary | ICD-10-CM | POA: Diagnosis not present

## 2024-07-29 DIAGNOSIS — E785 Hyperlipidemia, unspecified: Secondary | ICD-10-CM | POA: Diagnosis not present

## 2024-08-13 DIAGNOSIS — I700 Atherosclerosis of aorta: Secondary | ICD-10-CM | POA: Diagnosis not present

## 2024-08-13 DIAGNOSIS — M4810 Ankylosing hyperostosis [Forestier], site unspecified: Secondary | ICD-10-CM | POA: Diagnosis not present

## 2024-08-13 DIAGNOSIS — J449 Chronic obstructive pulmonary disease, unspecified: Secondary | ICD-10-CM | POA: Diagnosis not present

## 2024-08-13 DIAGNOSIS — Z79899 Other long term (current) drug therapy: Secondary | ICD-10-CM | POA: Diagnosis not present

## 2024-08-13 DIAGNOSIS — E559 Vitamin D deficiency, unspecified: Secondary | ICD-10-CM | POA: Diagnosis not present

## 2024-08-13 DIAGNOSIS — E1165 Type 2 diabetes mellitus with hyperglycemia: Secondary | ICD-10-CM | POA: Diagnosis not present

## 2024-08-13 DIAGNOSIS — Z0189 Encounter for other specified special examinations: Secondary | ICD-10-CM | POA: Diagnosis not present

## 2024-08-13 DIAGNOSIS — I509 Heart failure, unspecified: Secondary | ICD-10-CM | POA: Diagnosis not present

## 2024-08-13 DIAGNOSIS — Z1159 Encounter for screening for other viral diseases: Secondary | ICD-10-CM | POA: Diagnosis not present

## 2024-08-13 DIAGNOSIS — I517 Cardiomegaly: Secondary | ICD-10-CM | POA: Diagnosis not present

## 2024-09-09 ENCOUNTER — Other Ambulatory Visit (HOSPITAL_BASED_OUTPATIENT_CLINIC_OR_DEPARTMENT_OTHER): Payer: Self-pay | Admitting: Nurse Practitioner

## 2024-09-09 DIAGNOSIS — Z780 Asymptomatic menopausal state: Secondary | ICD-10-CM

## 2024-10-08 ENCOUNTER — Other Ambulatory Visit: Payer: Self-pay | Admitting: Nurse Practitioner

## 2024-10-08 DIAGNOSIS — N924 Excessive bleeding in the premenopausal period: Secondary | ICD-10-CM

## 2024-10-17 ENCOUNTER — Ambulatory Visit
Admission: RE | Admit: 2024-10-17 | Discharge: 2024-10-17 | Disposition: A | Source: Ambulatory Visit | Attending: Nurse Practitioner

## 2024-10-17 DIAGNOSIS — N924 Excessive bleeding in the premenopausal period: Secondary | ICD-10-CM

## 2024-10-31 ENCOUNTER — Encounter (HOSPITAL_COMMUNITY): Payer: Self-pay

## 2024-10-31 ENCOUNTER — Emergency Department (HOSPITAL_COMMUNITY)
Admission: EM | Admit: 2024-10-31 | Discharge: 2024-10-31 | Disposition: A | Discharge status: Home / Self Care | Source: Home / Self Care | Attending: Emergency Medicine | Admitting: Emergency Medicine

## 2024-10-31 ENCOUNTER — Emergency Department (HOSPITAL_COMMUNITY)

## 2024-10-31 DIAGNOSIS — R1084 Generalized abdominal pain: Secondary | ICD-10-CM

## 2024-10-31 LAB — COMPREHENSIVE METABOLIC PANEL WITH GFR
ALT: 23 U/L (ref 0–44)
AST: 24 U/L (ref 15–41)
Albumin: 4 g/dL (ref 3.5–5.0)
Alkaline Phosphatase: 119 U/L (ref 38–126)
Anion gap: 9 (ref 5–15)
BUN: 14 mg/dL (ref 8–23)
CO2: 29 mmol/L (ref 22–32)
Calcium: 9.7 mg/dL (ref 8.9–10.3)
Chloride: 97 mmol/L — ABNORMAL LOW (ref 98–111)
Creatinine, Ser: 0.77 mg/dL (ref 0.44–1.00)
GFR, Estimated: >60 mL/min
Glucose, Bld: 221 mg/dL — ABNORMAL HIGH (ref 70–99)
Potassium: 4.2 mmol/L (ref 3.5–5.1)
Sodium: 136 mmol/L (ref 135–145)
Total Bilirubin: 0.4 mg/dL (ref 0.0–1.2)
Total Protein: 7.7 g/dL (ref 6.5–8.1)

## 2024-10-31 LAB — URINALYSIS, ROUTINE W REFLEX MICROSCOPIC
Bacteria, UA: NONE SEEN
Bilirubin Urine: NEGATIVE
Glucose, UA: 50 mg/dL — AB
Ketones, ur: NEGATIVE mg/dL
Leukocytes,Ua: NEGATIVE
Nitrite: NEGATIVE
Protein, ur: 100 mg/dL — AB
Specific Gravity, Urine: 1.012 (ref 1.005–1.030)
pH: 6 (ref 5.0–8.0)

## 2024-10-31 LAB — CBC
HCT: 47.2 % — ABNORMAL HIGH (ref 36.0–46.0)
Hemoglobin: 15.2 g/dL — ABNORMAL HIGH (ref 12.0–15.0)
MCH: 28.8 pg (ref 26.0–34.0)
MCHC: 32.2 g/dL (ref 30.0–36.0)
MCV: 89.6 fL (ref 80.0–100.0)
Platelets: 230 10*3/uL (ref 150–400)
RBC: 5.27 MIL/uL — ABNORMAL HIGH (ref 3.87–5.11)
RDW: 13.7 % (ref 11.5–15.5)
WBC: 5.2 10*3/uL (ref 4.0–10.5)
nRBC: 0 % (ref 0.0–0.2)

## 2024-10-31 LAB — LIPASE, BLOOD: Lipase: 38 U/L (ref 11–51)

## 2024-10-31 MED ORDER — HYDROCODONE-ACETAMINOPHEN 5-325 MG PO TABS
1.0000 | ORAL_TABLET | Freq: Once | ORAL | Status: AC
  Administered 2024-10-31: 1 via ORAL
  Filled 2024-10-31: qty 1

## 2024-10-31 MED ORDER — HYDROCODONE-ACETAMINOPHEN 5-325 MG PO TABS: 2.0000 | ORAL_TABLET | ORAL | 0 refills | Status: AC | PRN

## 2024-10-31 MED ORDER — ONDANSETRON HCL 4 MG/2ML IJ SOLN
4.0000 mg | Freq: Once | INTRAMUSCULAR | Status: AC
  Administered 2024-10-31: 4 mg via INTRAVENOUS
  Filled 2024-10-31: qty 2

## 2024-10-31 MED ORDER — SODIUM CHLORIDE 0.9 % IV BOLUS
500.0000 mL | Freq: Once | INTRAVENOUS | Status: AC
  Administered 2024-10-31: 500 mL via INTRAVENOUS

## 2024-10-31 MED ORDER — ONDANSETRON HCL 4 MG PO TABS: 4.0000 mg | ORAL_TABLET | Freq: Four times a day (QID) | ORAL | 0 refills | Status: AC

## 2024-10-31 MED ORDER — OXYCODONE-ACETAMINOPHEN 5-325 MG PO TABS: 1.0000 | ORAL_TABLET | Freq: Once | ORAL | Status: DC

## 2024-10-31 MED ORDER — MORPHINE SULFATE (PF) 4 MG/ML IV SOLN
4.0000 mg | Freq: Once | INTRAVENOUS | Status: AC
  Administered 2024-10-31: 4 mg via INTRAVENOUS
  Filled 2024-10-31: qty 1

## 2024-10-31 MED ORDER — IOHEXOL 350 MG/ML SOLN
75.0000 mL | Freq: Once | INTRAVENOUS | Status: AC | PRN
  Administered 2024-10-31: 75 mL via INTRAVENOUS

## 2024-10-31 NOTE — Discharge Instructions (Signed)
 You were seen in the emergency department for abdominal pain.  Your CT scan showed findings that might be consistent with possible cancer.  But we do not know the diagnosis until you get followed up through the GYN doctor and get more testing done.  I am prescribing nausea medicine and pain medicine for you.  Please follow-up with Dr. Viktoria as seen below.  If you worsen in any way, please be reevaluated immediately.

## 2024-10-31 NOTE — ED Provider Notes (Incomplete)
 " [place] [facility] Provider Note   CSN: [id number] Arrival date & time: 10/31/24  9341     Patient presents with: Abdominal Pain   Sharon Swanson is a 64 y.o. female.  {Add pertinent medical, surgical, social history, OB history to HPI:32947}  Abdominal Pain  64 year old female with a history of diabetes, hypertension, hyperlipidemia, heart failure with preserved ejection fraction, COPD not on oxygen , obesity presents with abdominal pain.  States she has had abdominal pain, occasional vomiting and diarrhea, and vaginal bleeding for nearly the past month.  She was postmenopausal prior to this.  She states she was seen recently and had a GYN scoping procedure and was advised that she needs a hysterectomy but cannot give further details about this.    Prior to Admission medications  Medication Sig Start Date End Date Taking? Authorizing Provider  acetaminophen  (TYLENOL ) 325 MG tablet Take 2 tablets (650 mg total) by mouth every 6 (six) hours as needed. 03/06/22   Lovorn, Megan, MD  albuterol  (VENTOLIN  HFA) 108 (90 Base) MCG/ACT inhaler Inhale 2 puffs into the lungs every 4 (four) hours as needed for wheezing or shortness of breath. 06/22/23   Oley Bascom RAMAN, NP  amLODipine  (NORVASC ) 10 MG tablet TAKE 1 TABLET BY MOUTH DAILY 06/29/23   Nichols, Tonya S, NP  aspirin  EC 81 MG tablet Take 1 tablet (81 mg total) by mouth daily. Swallow whole. 06/12/22   Wyn Jackee VEAR Mickey., NP  atorvastatin  (LIPITOR) 80 MG tablet TAKE 1 TABLET BY MOUTH DAILY 11/19/23   Paseda, Folashade R, FNP  carvedilol  (COREG ) 25 MG tablet Take 1 tablet (25 mg total) by mouth 2 (two) times daily. 06/29/23   Oley Bascom RAMAN, NP  cetirizine  (ZYRTEC ) 10 MG tablet Take 1 tablet (10 mg total) by mouth daily as needed for allergies. 01/11/24   Cheryle Page, MD  Continuous Blood Gluc Receiver (FREESTYLE LIBRE 2 READER) DEVI Use as directed daily. 11/07/22   Paseda, Folashade R, FNP  Continuous  Glucose Sensor (FREESTYLE LIBRE 2 SENSOR) MISC 1 application  by Does not apply route every 14 (fourteen) days. 06/29/23   Oley Bascom RAMAN, NP  DULoxetine  (CYMBALTA ) 60 MG capsule TAKE 1 CAPSULE BY MOUTH DAILY 06/22/23   Nichols, Tonya S, NP  fluticasone  (FLONASE ) 50 MCG/ACT nasal spray SHAKE LIQUID AND USE 1 SPRAY IN EACH NOSTRIL DAILY Patient taking differently: Place 1 spray into both nostrils daily. 01/10/23   Oley Bascom RAMAN, NP  furosemide  (LASIX ) 40 MG tablet TAKE 1 TABLET(40 MG) BY MOUTH TWICE DAILY 11/26/23   Nichols, Tonya S, NP  gabapentin  (NEURONTIN ) 300 MG capsule TAKE 1 CAPSULE BY MOUTH 3 TIMES  DAILY 06/22/23   Nichols, Tonya S, NP  insulin  glargine (LANTUS ) 100 UNIT/ML injection ADMINISTER 20 UNITS UNDER THE SKIN AT BEDTIME Patient taking differently: Inject 20 Units into the skin at bedtime. 07/27/23   Oley Bascom RAMAN, NP  insulin  lispro (HUMALOG ) 100 UNIT/ML injection Inject 0.1 mLs (10 Units total) into the skin 2 (two) times daily with a meal. 06/29/23   Oley Bascom RAMAN, NP  ipratropium-albuterol  (DUONEB) 0.5-2.5 (3) MG/3ML SOLN Take 3 mLs by nebulization every 6 (six) hours as needed (shortness of breath, wheeze). 07/26/23 08/19/24  Oley Bascom RAMAN, NP  linaclotide  (LINZESS ) 145 MCG CAPS capsule Take 1 capsule (145 mcg total) by mouth daily before breakfast. 06/29/23   Nichols, Tonya S, NP  metFORMIN  (GLUCOPHAGE ) 1000 MG tablet Take 1 tablet (1,000 mg total) by mouth  2 (two) times daily with a meal. 06/29/23   Oley Bascom RAMAN, NP  ondansetron  (ZOFRAN ) 4 MG tablet Take 1 tablet (4 mg total) by mouth every 8 (eight) hours as needed for nausea or vomiting. 09/07/21   Lovorn, Megan, MD  sacubitril -valsartan  (ENTRESTO ) 97-103 MG Take 1 tablet by mouth 2 (two) times daily. 02/26/23   Paseda, Folashade R, FNP  Semaglutide ,0.25 or 0.5MG /DOS, (OZEMPIC , 0.25 OR 0.5 MG/DOSE,) 2 MG/3ML SOPN Inject 0.5 mg into the skin once a week. 06/29/23   Oley Bascom RAMAN, NP  spironolactone  (ALDACTONE ) 50 MG  tablet Take 1 tablet (50 mg total) by mouth daily. 06/22/23   Oley Bascom RAMAN, NP  tiZANidine  (ZANAFLEX ) 4 MG tablet Take 1 tablet (4 mg total) by mouth every 6 (six) hours as needed for muscle spasms. 01/18/23   Oley Bascom RAMAN, NP    Allergies: Aspirin     Review of Systems  Gastrointestinal:  Positive for abdominal pain.    Updated Vital Signs BP (!) 197/89 (BP Location: Left Wrist)   Pulse 94   Temp 98.8 F (37.1 C) (Oral)   Resp 20   SpO2 97%   Physical Exam  (all labs ordered are listed, but only abnormal results are displayed) Labs Reviewed  COMPREHENSIVE METABOLIC PANEL WITH GFR - Abnormal; Notable for the following components:      Result Value   Chloride 97 (*)    Glucose, Bld 221 (*)    All other components within normal limits  CBC - Abnormal; Notable for the following components:   RBC 5.27 (*)    Hemoglobin 15.2 (*)    HCT 47.2 (*)    All other components within normal limits  URINALYSIS, ROUTINE W REFLEX MICROSCOPIC - Abnormal; Notable for the following components:   Glucose, UA 50 (*)    Hgb urine dipstick MODERATE (*)    Protein, ur 100 (*)    All other components within normal limits  LIPASE, BLOOD  OVARIAN MALIGNANCY RISK-ROMA    EKG: None  Radiology: US  Abdomen Limited RUQ (LIVER/GB) Result Date: 10/31/2024 EXAM: Right Upper Quadrant Abdominal Ultrasound TECHNIQUE: Real-time ultrasonography of the right upper quadrant of the abdomen was performed. COMPARISON: US  Abdomen 06/01/2012, CT of the abdomen and pelvis 10/31/2024. CLINICAL HISTORY: Epigastric pain. FINDINGS: LIVER: Increased echogenicity of the liver. Likely focal fatty sparing along the gallbladder fossa. No intrahepatic biliary ductal dilatation. No mass. BILIARY SYSTEM: Gallbladder wall thickness measures 3.0 mm. Negative sonographic Murphy's sign. No cholelithiasis. The common bile duct measures 8.0 mm. No evidence of pericholecystic fluid. OTHER: No right upper quadrant ascites. IMPRESSION:  1. No cholecystolithiasis or changes of acute cholecystitis. 2. The common bile duct is dilated, measuring 8 mm, likely representing age-related ectasia. Correlation with serum bilirubin is recommended. If elevated, a nonemergent multiphase abdominal MRI with IV contrast could be considered. 3. Hepatic steatosis. Electronically signed by: Rogelia Myers MD 10/31/2024 02:25 PM EST RP Workstation: [id number]   CT ABDOMEN PELVIS W CONTRAST Result Date: 10/31/2024 EXAM: CT ABDOMEN AND PELVIS WITH CONTRAST 10/31/2024 10:17:32 AM TECHNIQUE: CT of the abdomen and pelvis was performed with the administration of 75 mL of iohexol  (OMNIPAQUE ) 350 MG/ML injection. Multiplanar reformatted images are provided for review. Automated exposure control, iterative reconstruction, and/or weight-based adjustment of the mA/kV was utilized to reduce the radiation dose to as low as reasonably achievable. COMPARISON: 10/17/2024 CLINICAL HISTORY: Abdominal pain, acute, nonlocalized. FINDINGS: LOWER CHEST: Mild cardiomegaly. Subsegmental atelectasis in the left lung base. LIVER: The liver is unremarkable.  GALLBLADDER AND BILE DUCTS: Along the gallbladder surface, there is either adjacent inflammation or enhancing soft tissue noted, best visualized on the sagittal image 61. No radiopaque gallstones. No biliary ductal dilatation. SPLEEN: No acute abnormality. PANCREAS: No acute abnormality. ADRENAL GLANDS: No acute abnormality. KIDNEYS, URETERS AND BLADDER: No stones in the kidneys or ureters. No hydronephrosis. No perinephric or periureteral stranding. The urinary bladder is distended without focal abnormality. GI AND BOWEL: Stomach demonstrates no acute abnormality. There is no bowel obstruction. Soft tissue nodularity along the omentum with the largest nodule measuring 2.8 cm (axial 51). PERITONEUM AND RETROPERITONEUM: No ascites. No free air. No free pelvic fluid. VASCULATURE: Aorta is normal in caliber. Diffuse aortoiliac atherosclerosis.  LYMPH NODES: No lymphadenopathy. REPRODUCTIVE ORGANS: Enlarged uterus containing multiple fibroids. The endometrium is thickened measuring 1.9 cm. Non-enlarged ovaries. BONES AND SOFT TISSUES: Multilevel degenerative disc disease of the spine. No acute osseous abnormality. Critical value/emergent results were called by telephone at the time of interpretation on 10/31/2024 at 11:21 AM to Dr. Fredia, who verbally acknowledged these results. IMPRESSION: 1. Omental soft tissue nodularity, largest measuring 2.8 cm, suspicious for peritoneal metastatic disease. 2. Enlarged, fibroid uterus. The endometrium appears thickened, measuring 1.9 cm. A nonemergent pelvic MRI with IV contrast should be considered for confirmation. 3. Possible inflammation or enhancing soft tissue along the gallbladder surface. Given the omental findings, this is concerning for enhancing soft tissue. If there is clinical concern for acute cholecystitis, a nuclear medicine hepatobiliary scan could be considered. 4. Critical value or emergent results were called by telephone at the time of interpretation on October 31, 2024 at 11:21 am to Dr. Fredia, who verbally acknowledged these results. Electronically signed by: Rogelia Myers MD 10/31/2024 11:28 AM EST RP Workstation: [id number]    {Document cardiac monitor, telemetry assessment procedure when appropriate:32947} Procedures   Medications Ordered in the ED  morphine  (PF) 4 MG/ML injection 4 mg (4 mg Intravenous Given 10/31/24 0933)  ondansetron  (ZOFRAN ) injection 4 mg (4 mg Intravenous Given 10/31/24 0936)  iohexol  (OMNIPAQUE ) 350 MG/ML injection 75 mL (75 mLs Intravenous Contrast Given 10/31/24 1018)  sodium chloride  0.9 % bolus 500 mL (500 mLs Intravenous New Bag/Given 10/31/24 1415)    Clinical Course as of 10/31/24 1511  Fri Oct 31, 2024  1510 Labs significant for glucose of 221.  Hemoglobin is elevated at 15.2.  No evidence of infection on urinalysis. [AJ]    Clinical Course User  Index [AJ] Telly Jawad, Rosette Kirsch, MD   {Click here for ABCD2, HEART and other calculators REFRESH Note before signing:1}                              Medical Decision Making Amount and/or Complexity of Data Reviewed Labs: ordered. Radiology: ordered.   64 year old female presents with abdominal pain.  Has also had 3 weeks of vaginal bleeding despite being postmenopausal.  No constitutional symptoms.  CT findings concerning for metastatic malignancy, possible GYN origin.  Discussed with Dr. Viktoria and [facility]je[facility] Onc who will see patient in clinic.  She requested to discuss with local GYN in the facility for possible endometrial biopsy.  I also discussed this with Dr. Jayne and he is happy to see the patient in the clinic.  However patient states that [place] is too far for her so she will see Dr. Viktoria.  Will treat with analgesia and antiemetics for now.All results reviewed with patient.  All questions answered.  Careful return precautions  have been given.  Close follow-up advised. Patient is happy with plan and requests discharge.   {Document critical care time when appropriate  Document review of labs and clinical decision tools ie CHADS2VASC2, etc  Document your independent review of radiology images and any outside records  Document your discussion with family members, caretakers and with consultants  Document social determinants of health affecting pt's care  Document your decision making why or why not admission, treatments were needed:32947:::1}   Final diagnoses:  Generalized abdominal pain    ED Discharge Orders     None        "

## 2024-10-31 NOTE — ED Provider Triage Note (Signed)
 Emergency Medicine Provider Triage Evaluation Note  Sharon Swanson , a 64 y.o. female  was evaluated in triage.  Pt complains of abd pain.  Pt has been having diffuse abd pain for weeks.  She has seen someone who told her she had an infection and gave her abx which she took, but it's worse.  She does not know the infection name or where it was.  Doctor was on [employer], but she does not know who she saw.  No n/v.  Trouble swallowing pills.  Review of Systems  Positive: Abd pain Negative: fever  Physical Exam  BP (!) 188/92 (BP Location: Right Arm)   Pulse 100   Temp 98.8 F (37.1 C) (Oral)   Resp 18   SpO2 96%  Gen:   Awake, no distress   Resp:  Normal effort  MSK:   Moves extremities without difficulty  Other:    Medical Decision Making  Medically screening exam initiated at 9:15 AM.  Appropriate orders placed.  Sharon Swanson was informed that the remainder of the evaluation will be completed by another provider, this initial triage assessment does not replace that evaluation, and the importance of remaining in the ED until their evaluation is complete.     Dean Clarity, MD 10/31/24 [phone]

## 2024-10-31 NOTE — ED Triage Notes (Signed)
 Patient BIB [facility] c/o abdominal pain x 3 weeks, n/v/d over the past 3 days along with vaginal bleeding.  Patient has history of endometriosis and ovarian cysts.  CBG 284

## 2024-11-07 ENCOUNTER — Inpatient Hospital Stay: Admitting: Gynecologic Oncology
